# Patient Record
Sex: Female | Born: 1940 | Race: Black or African American | Hispanic: No | Marital: Married | Smoking: Former smoker
Health system: Southern US, Community
[De-identification: ages and names within clinical notes are randomized; demographics above are authoritative.]

## PROBLEM LIST (undated history)

## (undated) DIAGNOSIS — K5790 Diverticulosis of intestine, part unspecified, without perforation or abscess without bleeding: Secondary | ICD-10-CM

## (undated) DIAGNOSIS — Z87891 Personal history of nicotine dependence: Secondary | ICD-10-CM

## (undated) DIAGNOSIS — I10 Essential (primary) hypertension: Secondary | ICD-10-CM

## (undated) DIAGNOSIS — M1990 Unspecified osteoarthritis, unspecified site: Secondary | ICD-10-CM

## (undated) DIAGNOSIS — E785 Hyperlipidemia, unspecified: Secondary | ICD-10-CM

## (undated) DIAGNOSIS — K219 Gastro-esophageal reflux disease without esophagitis: Secondary | ICD-10-CM

## (undated) DIAGNOSIS — R011 Cardiac murmur, unspecified: Secondary | ICD-10-CM

## (undated) DIAGNOSIS — Z9289 Personal history of other medical treatment: Secondary | ICD-10-CM

## (undated) DIAGNOSIS — R931 Abnormal findings on diagnostic imaging of heart and coronary circulation: Secondary | ICD-10-CM

## (undated) DIAGNOSIS — E119 Type 2 diabetes mellitus without complications: Secondary | ICD-10-CM

## (undated) DIAGNOSIS — I2510 Atherosclerotic heart disease of native coronary artery without angina pectoris: Secondary | ICD-10-CM

## (undated) DIAGNOSIS — R0600 Dyspnea, unspecified: Secondary | ICD-10-CM

## (undated) DIAGNOSIS — H269 Unspecified cataract: Secondary | ICD-10-CM

## (undated) HISTORY — DX: Hyperlipidemia, unspecified: E78.5

## (undated) HISTORY — DX: Diverticulosis of intestine, part unspecified, without perforation or abscess without bleeding: K57.90

## (undated) HISTORY — DX: Unspecified osteoarthritis, unspecified site: M19.90

## (undated) HISTORY — PX: EYE SURGERY: SHX253

## (undated) HISTORY — DX: Essential (primary) hypertension: I10

## (undated) HISTORY — DX: Type 2 diabetes mellitus without complications: E11.9

## (undated) HISTORY — PX: COLONOSCOPY: SHX174

## (undated) HISTORY — DX: Personal history of other medical treatment: Z92.89

## (undated) HISTORY — DX: Personal history of nicotine dependence: Z87.891

## (undated) HISTORY — DX: Abnormal findings on diagnostic imaging of heart and coronary circulation: R93.1

## (undated) HISTORY — DX: Gastro-esophageal reflux disease without esophagitis: K21.9

## (undated) HISTORY — PX: TONSILLECTOMY: SUR1361

## (undated) HISTORY — DX: Unspecified cataract: H26.9

---

## 1963-02-28 HISTORY — PX: TONSILECTOMY, ADENOIDECTOMY, BILATERAL MYRINGOTOMY AND TUBES: SHX2538

## 1987-02-28 HISTORY — PX: ABDOMINAL HYSTERECTOMY: SHX81

## 1998-04-01 ENCOUNTER — Ambulatory Visit (HOSPITAL_COMMUNITY): Admission: RE | Admit: 1998-04-01 | Discharge: 1998-04-01 | Payer: Self-pay | Admitting: Family Medicine

## 1998-04-01 ENCOUNTER — Encounter: Payer: Self-pay | Admitting: Family Medicine

## 1999-03-29 ENCOUNTER — Encounter: Payer: Self-pay | Admitting: *Deleted

## 1999-03-29 ENCOUNTER — Emergency Department (HOSPITAL_COMMUNITY): Admission: EM | Admit: 1999-03-29 | Discharge: 1999-03-29 | Payer: Self-pay | Admitting: *Deleted

## 2002-03-05 ENCOUNTER — Other Ambulatory Visit: Admission: RE | Admit: 2002-03-05 | Discharge: 2002-03-05 | Payer: Self-pay | Admitting: *Deleted

## 2002-07-31 ENCOUNTER — Other Ambulatory Visit: Admission: RE | Admit: 2002-07-31 | Discharge: 2002-07-31 | Payer: Self-pay | Admitting: *Deleted

## 2003-02-28 DIAGNOSIS — R931 Abnormal findings on diagnostic imaging of heart and coronary circulation: Secondary | ICD-10-CM

## 2003-02-28 HISTORY — DX: Abnormal findings on diagnostic imaging of heart and coronary circulation: R93.1

## 2003-04-28 ENCOUNTER — Encounter (INDEPENDENT_AMBULATORY_CARE_PROVIDER_SITE_OTHER): Payer: Self-pay | Admitting: *Deleted

## 2003-04-28 LAB — CONVERTED CEMR LAB

## 2003-04-30 ENCOUNTER — Ambulatory Visit (HOSPITAL_COMMUNITY): Admission: RE | Admit: 2003-04-30 | Discharge: 2003-04-30 | Payer: Self-pay | Admitting: Family Medicine

## 2003-04-30 ENCOUNTER — Encounter: Admission: RE | Admit: 2003-04-30 | Discharge: 2003-04-30 | Payer: Self-pay | Admitting: Family Medicine

## 2003-12-11 ENCOUNTER — Ambulatory Visit: Payer: Self-pay

## 2004-02-28 DIAGNOSIS — Z9289 Personal history of other medical treatment: Secondary | ICD-10-CM

## 2004-02-28 HISTORY — DX: Personal history of other medical treatment: Z92.89

## 2004-06-23 ENCOUNTER — Ambulatory Visit: Payer: Self-pay | Admitting: Family Medicine

## 2004-10-06 ENCOUNTER — Ambulatory Visit: Payer: Self-pay | Admitting: Family Medicine

## 2004-10-14 ENCOUNTER — Ambulatory Visit: Payer: Self-pay | Admitting: Family Medicine

## 2004-10-20 ENCOUNTER — Encounter: Admission: RE | Admit: 2004-10-20 | Discharge: 2004-10-20 | Payer: Self-pay | Admitting: Sports Medicine

## 2004-12-16 ENCOUNTER — Ambulatory Visit: Payer: Self-pay | Admitting: Sports Medicine

## 2005-03-23 ENCOUNTER — Inpatient Hospital Stay (HOSPITAL_COMMUNITY): Admission: EM | Admit: 2005-03-23 | Discharge: 2005-03-24 | Payer: Self-pay | Admitting: Emergency Medicine

## 2005-03-23 ENCOUNTER — Ambulatory Visit: Payer: Self-pay | Admitting: Family Medicine

## 2005-03-27 ENCOUNTER — Ambulatory Visit: Payer: Self-pay | Admitting: Family Medicine

## 2005-03-27 ENCOUNTER — Encounter: Admission: RE | Admit: 2005-03-27 | Discharge: 2005-06-25 | Payer: Self-pay | Admitting: Family Medicine

## 2005-03-31 ENCOUNTER — Ambulatory Visit: Payer: Self-pay | Admitting: Family Medicine

## 2005-04-03 ENCOUNTER — Emergency Department (HOSPITAL_COMMUNITY): Admission: EM | Admit: 2005-04-03 | Discharge: 2005-04-03 | Payer: Self-pay | Admitting: Emergency Medicine

## 2005-04-04 ENCOUNTER — Emergency Department (HOSPITAL_COMMUNITY): Admission: EM | Admit: 2005-04-04 | Discharge: 2005-04-04 | Payer: Self-pay | Admitting: Family Medicine

## 2005-04-05 ENCOUNTER — Ambulatory Visit: Payer: Self-pay | Admitting: Family Medicine

## 2005-04-06 ENCOUNTER — Ambulatory Visit: Payer: Self-pay | Admitting: Family Medicine

## 2005-04-13 ENCOUNTER — Ambulatory Visit: Payer: Self-pay | Admitting: Family Medicine

## 2005-05-01 ENCOUNTER — Ambulatory Visit: Payer: Self-pay | Admitting: Family Medicine

## 2005-05-11 ENCOUNTER — Ambulatory Visit: Payer: Self-pay | Admitting: Family Medicine

## 2005-06-05 ENCOUNTER — Ambulatory Visit: Payer: Self-pay | Admitting: Family Medicine

## 2005-06-14 ENCOUNTER — Ambulatory Visit: Payer: Self-pay | Admitting: Family Medicine

## 2005-07-06 ENCOUNTER — Ambulatory Visit: Payer: Self-pay | Admitting: Family Medicine

## 2005-07-10 ENCOUNTER — Encounter: Admission: RE | Admit: 2005-07-10 | Discharge: 2005-07-10 | Payer: Self-pay | Admitting: Sports Medicine

## 2005-08-03 ENCOUNTER — Ambulatory Visit: Payer: Self-pay | Admitting: Family Medicine

## 2005-10-23 ENCOUNTER — Ambulatory Visit: Payer: Self-pay | Admitting: Family Medicine

## 2005-11-23 ENCOUNTER — Ambulatory Visit: Payer: Self-pay | Admitting: Sports Medicine

## 2005-12-13 ENCOUNTER — Ambulatory Visit: Payer: Self-pay | Admitting: Family Medicine

## 2005-12-28 ENCOUNTER — Ambulatory Visit: Payer: Self-pay | Admitting: Sports Medicine

## 2006-01-04 ENCOUNTER — Ambulatory Visit: Payer: Self-pay | Admitting: Sports Medicine

## 2006-02-02 ENCOUNTER — Ambulatory Visit: Payer: Self-pay | Admitting: Family Medicine

## 2006-04-26 DIAGNOSIS — E785 Hyperlipidemia, unspecified: Secondary | ICD-10-CM | POA: Insufficient documentation

## 2006-04-26 DIAGNOSIS — E119 Type 2 diabetes mellitus without complications: Secondary | ICD-10-CM

## 2006-04-26 DIAGNOSIS — L659 Nonscarring hair loss, unspecified: Secondary | ICD-10-CM | POA: Insufficient documentation

## 2006-04-26 DIAGNOSIS — I10 Essential (primary) hypertension: Secondary | ICD-10-CM | POA: Insufficient documentation

## 2006-04-26 DIAGNOSIS — M1990 Unspecified osteoarthritis, unspecified site: Secondary | ICD-10-CM | POA: Insufficient documentation

## 2006-04-26 DIAGNOSIS — E669 Obesity, unspecified: Secondary | ICD-10-CM | POA: Insufficient documentation

## 2006-04-26 DIAGNOSIS — M949 Disorder of cartilage, unspecified: Secondary | ICD-10-CM

## 2006-04-26 DIAGNOSIS — M899 Disorder of bone, unspecified: Secondary | ICD-10-CM | POA: Insufficient documentation

## 2006-04-26 DIAGNOSIS — E781 Pure hyperglyceridemia: Secondary | ICD-10-CM | POA: Insufficient documentation

## 2006-04-26 HISTORY — DX: Type 2 diabetes mellitus without complications: E11.9

## 2006-04-27 ENCOUNTER — Encounter (INDEPENDENT_AMBULATORY_CARE_PROVIDER_SITE_OTHER): Payer: Self-pay | Admitting: *Deleted

## 2006-05-31 ENCOUNTER — Encounter (INDEPENDENT_AMBULATORY_CARE_PROVIDER_SITE_OTHER): Payer: Self-pay | Admitting: Family Medicine

## 2006-05-31 ENCOUNTER — Ambulatory Visit: Payer: Self-pay | Admitting: Sports Medicine

## 2006-05-31 LAB — CONVERTED CEMR LAB
AST: 24 units/L (ref 0–37)
Albumin: 4.6 g/dL (ref 3.5–5.2)
BUN: 14 mg/dL (ref 6–23)
CO2: 26 meq/L (ref 19–32)
Chloride: 103 meq/L (ref 96–112)
Creatinine, Ser: 1.08 mg/dL (ref 0.40–1.20)
Direct LDL: 104 mg/dL — ABNORMAL HIGH
Hgb A1c MFr Bld: 6.6 %
Potassium: 3.6 meq/L (ref 3.5–5.3)
Total Bilirubin: 0.4 mg/dL (ref 0.3–1.2)

## 2006-06-01 ENCOUNTER — Telehealth (INDEPENDENT_AMBULATORY_CARE_PROVIDER_SITE_OTHER): Payer: Self-pay | Admitting: Family Medicine

## 2006-06-07 ENCOUNTER — Encounter (INDEPENDENT_AMBULATORY_CARE_PROVIDER_SITE_OTHER): Payer: Self-pay | Admitting: Family Medicine

## 2006-06-18 ENCOUNTER — Ambulatory Visit: Payer: Self-pay | Admitting: Family Medicine

## 2006-06-26 ENCOUNTER — Telehealth (INDEPENDENT_AMBULATORY_CARE_PROVIDER_SITE_OTHER): Payer: Self-pay | Admitting: Family Medicine

## 2006-10-11 ENCOUNTER — Ambulatory Visit: Payer: Self-pay | Admitting: Family Medicine

## 2006-10-11 ENCOUNTER — Encounter (INDEPENDENT_AMBULATORY_CARE_PROVIDER_SITE_OTHER): Payer: Self-pay | Admitting: Family Medicine

## 2006-10-11 LAB — CONVERTED CEMR LAB: Hgb A1c MFr Bld: 6.8 %

## 2006-10-16 ENCOUNTER — Encounter (INDEPENDENT_AMBULATORY_CARE_PROVIDER_SITE_OTHER): Payer: Self-pay | Admitting: Family Medicine

## 2006-10-23 ENCOUNTER — Telehealth (INDEPENDENT_AMBULATORY_CARE_PROVIDER_SITE_OTHER): Payer: Self-pay | Admitting: Family Medicine

## 2006-10-23 LAB — CONVERTED CEMR LAB: Direct LDL: 141 mg/dL — ABNORMAL HIGH

## 2006-10-30 ENCOUNTER — Encounter (INDEPENDENT_AMBULATORY_CARE_PROVIDER_SITE_OTHER): Payer: Self-pay | Admitting: Family Medicine

## 2006-11-07 ENCOUNTER — Ambulatory Visit (HOSPITAL_COMMUNITY): Admission: RE | Admit: 2006-11-07 | Discharge: 2006-11-07 | Payer: Self-pay | Admitting: Orthopedic Surgery

## 2007-01-15 ENCOUNTER — Ambulatory Visit: Payer: Self-pay | Admitting: Family Medicine

## 2007-03-26 ENCOUNTER — Encounter (INDEPENDENT_AMBULATORY_CARE_PROVIDER_SITE_OTHER): Payer: Self-pay | Admitting: Family Medicine

## 2007-03-26 ENCOUNTER — Ambulatory Visit: Payer: Self-pay | Admitting: Family Medicine

## 2007-03-29 ENCOUNTER — Encounter (INDEPENDENT_AMBULATORY_CARE_PROVIDER_SITE_OTHER): Payer: Self-pay | Admitting: Family Medicine

## 2007-03-29 LAB — CONVERTED CEMR LAB
Alkaline Phosphatase: 101 units/L (ref 39–117)
Creatinine, Ser: 0.82 mg/dL (ref 0.40–1.20)
Potassium: 3.3 meq/L — ABNORMAL LOW (ref 3.5–5.3)
Total Bilirubin: 0.4 mg/dL (ref 0.3–1.2)

## 2007-06-13 ENCOUNTER — Ambulatory Visit: Payer: Self-pay | Admitting: Family Medicine

## 2007-06-13 DIAGNOSIS — K219 Gastro-esophageal reflux disease without esophagitis: Secondary | ICD-10-CM | POA: Insufficient documentation

## 2007-06-13 LAB — CONVERTED CEMR LAB: Hgb A1c MFr Bld: 6.8 %

## 2007-06-25 ENCOUNTER — Encounter: Admission: RE | Admit: 2007-06-25 | Discharge: 2007-06-25 | Payer: Self-pay | Admitting: Family Medicine

## 2007-07-04 ENCOUNTER — Encounter: Admission: RE | Admit: 2007-07-04 | Discharge: 2007-07-04 | Payer: Self-pay | Admitting: Family Medicine

## 2007-09-10 ENCOUNTER — Encounter (INDEPENDENT_AMBULATORY_CARE_PROVIDER_SITE_OTHER): Payer: Self-pay | Admitting: Family Medicine

## 2007-09-10 ENCOUNTER — Ambulatory Visit: Payer: Self-pay | Admitting: Family Medicine

## 2007-09-10 LAB — CONVERTED CEMR LAB: Hgb A1c MFr Bld: 6.9 %

## 2007-09-16 ENCOUNTER — Encounter (INDEPENDENT_AMBULATORY_CARE_PROVIDER_SITE_OTHER): Payer: Self-pay | Admitting: Family Medicine

## 2007-09-16 LAB — CONVERTED CEMR LAB
Calcium: 9.8 mg/dL (ref 8.4–10.5)
Glucose, Bld: 104 mg/dL — ABNORMAL HIGH (ref 70–99)

## 2007-09-24 ENCOUNTER — Encounter: Payer: Self-pay | Admitting: *Deleted

## 2007-09-24 ENCOUNTER — Telehealth: Payer: Self-pay | Admitting: *Deleted

## 2007-12-02 ENCOUNTER — Encounter (INDEPENDENT_AMBULATORY_CARE_PROVIDER_SITE_OTHER): Payer: Self-pay | Admitting: Family Medicine

## 2007-12-11 ENCOUNTER — Ambulatory Visit: Payer: Self-pay | Admitting: Family Medicine

## 2007-12-11 ENCOUNTER — Encounter (INDEPENDENT_AMBULATORY_CARE_PROVIDER_SITE_OTHER): Payer: Self-pay | Admitting: Family Medicine

## 2007-12-11 LAB — CONVERTED CEMR LAB: Hgb A1c MFr Bld: 6.5 %

## 2008-04-08 ENCOUNTER — Ambulatory Visit: Payer: Self-pay | Admitting: Family Medicine

## 2008-04-08 ENCOUNTER — Encounter (INDEPENDENT_AMBULATORY_CARE_PROVIDER_SITE_OTHER): Payer: Self-pay | Admitting: Family Medicine

## 2008-04-08 LAB — CONVERTED CEMR LAB: Hgb A1c MFr Bld: 7.1 %

## 2008-06-19 ENCOUNTER — Telehealth: Payer: Self-pay | Admitting: *Deleted

## 2008-09-01 ENCOUNTER — Telehealth: Payer: Self-pay | Admitting: *Deleted

## 2008-09-02 ENCOUNTER — Encounter (INDEPENDENT_AMBULATORY_CARE_PROVIDER_SITE_OTHER): Payer: Self-pay | Admitting: Family Medicine

## 2008-09-02 ENCOUNTER — Ambulatory Visit: Payer: Self-pay | Admitting: Family Medicine

## 2008-09-02 LAB — CONVERTED CEMR LAB
AST: 20 units/L (ref 0–37)
Alkaline Phosphatase: 93 units/L (ref 39–117)
CO2: 24 meq/L (ref 19–32)
Calcium: 9.8 mg/dL (ref 8.4–10.5)
Chloride: 105 meq/L (ref 96–112)
Cholesterol: 170 mg/dL (ref 0–200)
Creatinine, Ser: 0.8 mg/dL (ref 0.40–1.20)
LDL Cholesterol: 90 mg/dL (ref 0–99)
Total Bilirubin: 0.4 mg/dL (ref 0.3–1.2)
Total CHOL/HDL Ratio: 4.3
Triglycerides: 199 mg/dL — ABNORMAL HIGH (ref <150)
VLDL: 40 mg/dL (ref 0–40)

## 2008-09-03 ENCOUNTER — Encounter (INDEPENDENT_AMBULATORY_CARE_PROVIDER_SITE_OTHER): Payer: Self-pay | Admitting: Family Medicine

## 2008-11-25 ENCOUNTER — Telehealth: Payer: Self-pay | Admitting: *Deleted

## 2008-12-07 ENCOUNTER — Ambulatory Visit: Payer: Self-pay | Admitting: Family Medicine

## 2008-12-07 LAB — CONVERTED CEMR LAB: Hgb A1c MFr Bld: 7.2 %

## 2008-12-14 ENCOUNTER — Ambulatory Visit: Payer: Self-pay | Admitting: Internal Medicine

## 2008-12-17 ENCOUNTER — Telehealth: Payer: Self-pay | Admitting: Internal Medicine

## 2008-12-21 ENCOUNTER — Ambulatory Visit: Payer: Self-pay | Admitting: Internal Medicine

## 2008-12-21 ENCOUNTER — Encounter: Payer: Self-pay | Admitting: Internal Medicine

## 2008-12-22 ENCOUNTER — Encounter: Payer: Self-pay | Admitting: Internal Medicine

## 2008-12-24 ENCOUNTER — Ambulatory Visit: Payer: Self-pay | Admitting: Family Medicine

## 2009-03-02 ENCOUNTER — Ambulatory Visit: Payer: Self-pay | Admitting: Family Medicine

## 2009-03-16 ENCOUNTER — Encounter: Admission: RE | Admit: 2009-03-16 | Discharge: 2009-03-16 | Payer: Self-pay | Admitting: Family Medicine

## 2009-03-25 ENCOUNTER — Encounter: Payer: Self-pay | Admitting: Family Medicine

## 2009-03-25 ENCOUNTER — Ambulatory Visit: Payer: Self-pay | Admitting: Family Medicine

## 2009-05-03 ENCOUNTER — Ambulatory Visit: Payer: Self-pay | Admitting: Family Medicine

## 2009-05-10 ENCOUNTER — Ambulatory Visit: Payer: Self-pay | Admitting: Family Medicine

## 2009-05-21 ENCOUNTER — Ambulatory Visit: Payer: Self-pay | Admitting: Family Medicine

## 2009-05-21 ENCOUNTER — Encounter: Payer: Self-pay | Admitting: Family Medicine

## 2009-05-21 LAB — CONVERTED CEMR LAB
BUN: 12 mg/dL (ref 6–23)
Chloride: 102 meq/L (ref 96–112)

## 2009-05-26 ENCOUNTER — Ambulatory Visit: Payer: Self-pay | Admitting: Family Medicine

## 2009-06-21 ENCOUNTER — Ambulatory Visit: Payer: Self-pay | Admitting: Family Medicine

## 2009-06-21 ENCOUNTER — Telehealth (INDEPENDENT_AMBULATORY_CARE_PROVIDER_SITE_OTHER): Payer: Self-pay | Admitting: *Deleted

## 2009-06-21 LAB — CONVERTED CEMR LAB
BUN: 19 mg/dL (ref 6–23)
CO2: 19 meq/L (ref 19–32)
Glucose, Urine, Semiquant: >=1000
Ketones, urine, test strip: NEGATIVE
Magnesium: 2.3 mg/dL (ref 1.5–2.5)
Nitrite: NEGATIVE
Potassium: 4.5 meq/L (ref 3.5–5.3)
Protein, U semiquant: NEGATIVE
Specific Gravity, Urine: <1.005
Urobilinogen, UA: 0.2

## 2009-06-22 ENCOUNTER — Telehealth: Payer: Self-pay | Admitting: Family Medicine

## 2009-06-28 ENCOUNTER — Ambulatory Visit: Payer: Self-pay | Admitting: Family Medicine

## 2009-06-28 LAB — CONVERTED CEMR LAB: Hgb A1c MFr Bld: 11.5 %

## 2009-07-13 ENCOUNTER — Ambulatory Visit: Payer: Self-pay | Admitting: Family Medicine

## 2009-07-13 ENCOUNTER — Encounter: Payer: Self-pay | Admitting: Family Medicine

## 2009-07-13 LAB — CONVERTED CEMR LAB
Albumin: 4.2 g/dL (ref 3.5–5.2)
BUN: 10 mg/dL (ref 6–23)
Bilirubin Urine: NEGATIVE
Chloride: 104 meq/L (ref 96–112)
Glucose, Bld: 291 mg/dL — ABNORMAL HIGH (ref 70–99)
Ketones, urine, test strip: NEGATIVE
MCHC: 32.6 g/dL (ref 30.0–36.0)
Nitrite: NEGATIVE
Platelets: 282 10*3/uL (ref 150–400)
Potassium: 3.9 meq/L (ref 3.5–5.3)
RDW: 14 % (ref 11.5–15.5)
Total Protein: 6.9 g/dL (ref 6.0–8.3)
Urobilinogen, UA: 0.2
WBC: 8.5 10*3/uL (ref 4.0–10.5)

## 2009-07-14 ENCOUNTER — Encounter: Payer: Self-pay | Admitting: Family Medicine

## 2009-08-26 ENCOUNTER — Ambulatory Visit: Payer: Self-pay | Admitting: Family Medicine

## 2009-12-28 ENCOUNTER — Telehealth: Payer: Self-pay | Admitting: *Deleted

## 2009-12-28 DIAGNOSIS — I7090 Unspecified atherosclerosis: Secondary | ICD-10-CM | POA: Insufficient documentation

## 2010-01-05 ENCOUNTER — Telehealth: Payer: Self-pay | Admitting: Family Medicine

## 2010-01-11 ENCOUNTER — Ambulatory Visit: Payer: Self-pay | Admitting: Vascular Surgery

## 2010-01-11 ENCOUNTER — Ambulatory Visit (HOSPITAL_COMMUNITY): Admission: RE | Admit: 2010-01-11 | Discharge: 2010-01-11 | Payer: Self-pay | Admitting: Family Medicine

## 2010-02-07 ENCOUNTER — Ambulatory Visit: Payer: Self-pay | Admitting: Family Medicine

## 2010-02-08 ENCOUNTER — Encounter: Payer: Self-pay | Admitting: Family Medicine

## 2010-02-08 ENCOUNTER — Ambulatory Visit: Payer: Self-pay | Admitting: Family Medicine

## 2010-02-08 LAB — CONVERTED CEMR LAB
Alkaline Phosphatase: 102 units/L (ref 39–117)
BUN: 8 mg/dL (ref 6–23)
Chloride: 105 meq/L (ref 96–112)
Cholesterol: 146 mg/dL (ref 0–200)
Glucose, Bld: 138 mg/dL — ABNORMAL HIGH (ref 70–99)
LDL Cholesterol: 80 mg/dL (ref 0–99)
Sodium: 143 meq/L (ref 135–145)
Total Bilirubin: 0.5 mg/dL (ref 0.3–1.2)
Total Protein: 7.1 g/dL (ref 6.0–8.3)
Triglycerides: 121 mg/dL (ref <150)
VLDL: 24 mg/dL (ref 0–40)

## 2010-03-23 ENCOUNTER — Encounter (INDEPENDENT_AMBULATORY_CARE_PROVIDER_SITE_OTHER): Payer: Self-pay | Admitting: *Deleted

## 2010-03-31 NOTE — Progress Notes (Signed)
Summary: Rx Prob  Phone Note Call from Patient Call back at Home Phone [phone]   Caller: Patient Summary of Call: pt having trouble with new medication she is taking. Initial call taken by: Raymond Gurney,  June 21, 2009 10:47 AM  Follow-up for Phone Call        patient reports since starting HCTZ she has been voiding frequently  every 15 to 30 minutes.  now for past 4 days has had leg cramping. Dr. Tye Savoy notified and he advises to stop HCTZ now and needs office visit.  appointment scheduled for [place] this afternoon with Dr. McDiarmid. patient states she has not taken HCTZ today yet. Follow-up by: Marcell Barlow RN,  June 21, 2009 12:38 PM

## 2010-03-31 NOTE — Assessment & Plan Note (Signed)
Summary: FU/KH   Vital Signs:  Patient profile:   70 year old female Height:      53.75 inches Weight:      178.1 pounds BMI:     43.50 Temp:     97.9 degrees F oral Pulse rate:   71 / minute BP sitting:   122 / 73  (left arm) Cuff size:   regular  Vitals Entered By: Levert Feinstein LPN (Jul 14, 5091 2:67 PM) CC: f/u dm Is Patient Diabetic? Yes Did you bring your meter with you today? No Pain Assessment Patient in pain? no        Primary Care Provider:  Augusto Gamble DO  CC:  f/u dm.  History of Present Illness: 1. DM: Ran out of testing strips, has not been checking sugars.  At last visit Dr. McDiarmid increased Glipizide to 76m by mouth QAM and QPM.  Also on metformin 10014mtwo times a day.  Often forgets to take PM dose of both meds.  Interested in starting insulin, but only wants to do one shot a day and admits she may not always remember to do that.  Was on insulin about 5 yrs ago when first dx, but quickly transitioned to by mouth meds about 8 weeks after insulin.   Denies polyuria, no polydipsia, no leg cramping. Vision much improved and back to baseline.   Hypoglycemic symptoms:none  Vaginal itching and discharge much improved after 3 days tx, no vaginal pain 2. HTN: Stable.  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 Take 1 Tablet By Mouth Every Morning 2)  Metformin Hcl 1000 Mg Tabs (Metformin Hcl) ...Marland Kitchen 1 Tablet By Mouth Twice A Day 3)  Ibuprofen 800 Mg Tabs (Ibuprofen) .... Take 1 Tablet By Mouth Every Twelve Hours 4)  Multivitamins   Tabs (Multiple Vitamin) .... One Daily 5)  Pravachol 40 Mg  Tabs (Pravastatin Sodium) .... One Tablet Daily 6)  Ranitidine Hcl 150 Mg Tabs (Ranitidine Hcl) .... 1/2 Tablet Daily 7)  Calcium 600/vitamin D 600-400 Mg-Unit Tabs (Calcium Carbonate-Vitamin D) .... 2 Tablets Daily 8)  Fish Oil  Oil (Fish Oil) .... 3 Pills A Day 9)  Lopressor 100 Mg Tabs (Metoprolol Tartrate) .... Take 1 Pill By Mouth Bid 10)  Glipizide 5 Mg Tabs (Glipizide) .... Two Tablets  By Mouth 30 Minutes Before Breakfast  and Two Tablets Before Evening Meal 11)  Norvasc 10 Mg Tabs (Amlodipine Besylate) .... Take 1 Pill By Mouth Qd 12)  Accu-Chek Advantage Diabetes  Kit (Blood Glucose Monitoring Suppl) .... Please Provide Meter, Strips, Lancets For Two Times A Day Cbg's  Allergies (verified): 1)  ! * Lisinopril  Past History:  Past Medical History: Last updated: 01/15/2007 SCr=1.0 (02/2005) UTI (03/2005) Bone Density - Lt hip = -1.0, L spine = -1.8 - 10/26/2004 cystectomy L wrist -  02/27/1965 ECHO - 06/28/2003 stress cardiolity - no ischemia or infarction; EF 47% - 12/30/2003  Past Surgical History: Last updated: 01/15/2007  Hysterectomy & SO (one removed) - 02/28/1987  Tonsillectomy - 02/28/1963  Family History: Last updated: Jun 30, 2006 father - died CAD, CHF, HTN mother - died CAD, `brain tumor` older brother - died CAD, CABG age 20,  older sister - died mouth cancer younger sister - died age 52,  rheumatic fever  Social History: Last updated: 04/08/2008 Married.  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 Lives in [place] with second wife. Has 4 grandchildren.  Tobacco >62yr, quit 02/2005 after admission for DKA.  Drinks at least 2 beers per day.  Worked at [employer] [place]yrbut was laid off fall 2008. 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 Niece (pCommunity education officerwho has mental issues (i.e. stabbed her baby who survived) currently living with pt - somewhat stressful for her.   Risk Factors: Alcohol Use: 1 (06/28/2009)  Risk Factors: Smoking Status: never (07/13/2009)  Review of Systems       The patient complains of weight gain.  The patient denies weight loss, vision loss, chest pain, syncope, dyspnea on exertion, peripheral edema, headaches, hemoptysis, incontinence, and muscle weakness.    Physical Exam  General:  VS reviewed, alert, well-developed, well-nourished, and well-hydrated.   Eyes:  vision grossly intact.   Mouth:  pharynx pink and moist.   Lungs:  normal respiratory effort, normal breath sounds, no crackles, and no wheezes.   Heart:  normal rate, regular rhythm, no murmur, no gallop, and no rub.   Abdomen:  soft, non-tender, normal bowel sounds, and no distention.   Msk:  normal ROM.   Extremities:  No clubbing, cyanosis, edema, or deformity noted with normal full range of motion of all joints.   Neurologic:  alert & oriented X3, cranial nerves II-XII intact, and DTRs symmetrical and normal.   Skin:  turgor normal and color normal.    Diabetes Management Exam:    Foot Exam (with socks and/or shoes not present):       Sensory-Pinprick/Light touch:          Left medial foot (L-4): normal          Left dorsal foot (L-5): normal          Left lateral foot (S-1): normal          Right medial foot (L-4): normal          Right dorsal foot (L-5): normal   Impression & Recommendations:  Problem # 1:  DIABETES MELLITUS II, UNCOMPLICATED (ICOEU-235.36Assessment Improved Discussed POC with Dr. ChErin Hearingncouraged pt to take all meds as prescribed.  Will order CMP today for renal assesment.  UA for glucose and protein. CBG in office is imporved at 266.  Pt not a good insulin canidate at this time  as she is unable to remember to take her oral meds.  Would like to get max benefit from by mouth meds prior to starting insulin.  Pt requesting new meter and strips as she feels reassured by checking her CBG's. Signed paperwork from [place] weeks ago, but pt still has not recieved supplies.  Will send script to pharmacy now for meter and supplies, if too expensive pt will call insurance and call back with what meter she would like.   Already on 200049metformin and max dose glipizide, pt to try and remember to take all doses.  Will recheck A1C in 8 weeks and readjust meds as needed.   Reviewed s/s of hypoglycemia.  Her updated medication list for this problem includes:    Aspirin Ec 81 Mg Tbec (Aspirin) .Marland Kitchen... Take 1 tablet by mouth every morning    Metformin Hcl 1000 Mg Tabs (Metformin hcl) .Marland Kitchen... 1 tablet by mouth twice a day    Glipizide 5 Mg Tabs (Glipizide) .Marland Kitchen..Marland Kitchen Two tablets  by mouth 30 minutes before breakfast  and two tablets before evening meal  Orders: Glucose Cap-[facility] (37482) Urinalysis-[facility] (00000) CBC-[facility] (70786) Comp Met-[facility] (75449-20100) [facility]- Est  Level 4 (71219)  Her updated medication list for this problem includes:    Aspirin Ec 81 Mg Tbec (Aspirin) .Marland Kitchen... Take 1 tablet by mouth every morning    Metformin Hcl 1000 Mg Tabs (Metformin hcl) .Marland Kitchen... 1 tablet by mouth twice a day    Glipizide 5 Mg Tabs (Glipizide) .Marland Kitchen..Marland Kitchen Two tablets  by mouth 30 minutes before breakfast  and two tablets before evening meal  Problem # 2:  RENAL INSUFFICIENCY, ACUTE (ICD-585.9) CMP today.  Consider adjusting meds if impaired Orders: Comp Met-[facility] (75883-25498) [place]- Est  Level 4 (26415)  Problem # 3:  HYPERTENSION, BENIGN SYSTEMIC (ICD-401.1) Assessment: Improved At goal. Continue Norvasc and Lopressor.  daily ASA Her updated medication list for this problem includes:    Lopressor 100 Mg Tabs (Metoprolol tartrate) .Marland Kitchen... Take 1 pill by mouth bid    Norvasc 10 Mg Tabs (Amlodipine besylate) .Marland Kitchen... Take  1 pill by mouth qd  Orders: Urinalysis-[facility] (00000) CBC-[facility] (83094) Comp Met-[facility] (07680-88110) [place]- Est  Level 4 (31594)  Complete Medication List: 1)  Aspirin Ec 81 Mg Tbec (Aspirin) .... Take 1 tablet by mouth every morning 2)  Metformin Hcl 1000 Mg Tabs (Metformin hcl) .Marland Kitchen.. 1 tablet by mouth twice a day 3)  Ibuprofen 800 Mg Tabs (Ibuprofen) .... Take 1 tablet by mouth every twelve hours 4)  Multivitamins Tabs (Multiple vitamin) .... One daily 5)  Pravachol 40 Mg Tabs (Pravastatin sodium) .... One tablet daily 6)  Ranitidine Hcl 150 Mg Tabs (Ranitidine hcl) .... 1/2 tablet daily 7)  Calcium 600/vitamin D 600-400 Mg-unit Tabs (Calcium carbonate-vitamin d) .... 2 tablets daily 8)  Fish Oil Oil (Fish oil) .... 3 pills a day 9)  Lopressor 100 Mg Tabs (Metoprolol tartrate) .... Take 1 pill by mouth bid 10)  Glipizide 5 Mg Tabs (Glipizide) .... Two tablets  by mouth 30 minutes before breakfast  and two tablets before evening meal 11)  Norvasc 10 Mg Tabs (Amlodipine besylate) .... Take 1 pill by mouth qd 12)  Accu-chek Advantage Diabetes Kit (Blood glucose monitoring suppl) .... Please provide meter, strips, lancets for two times a day cbg's  Patient Instructions: 1)  Contionue taking metformin and glipizide twice a day.  2)  Continue with metoprolol, norvasc 3)  Check your CBG's two times a day, write them down 4)  I will call you with your lab results if they are abnormal. 5)  Follow up in 4-8 weeks Prescriptions: GLIPIZIDE 5 MG TABS (GLIPIZIDE) Two tablets  by mouth 30 minutes before breakfast  and two tablets before evening meal  #120 x 1   Entered and Authorized by:   Augusto Gamble DO   Signed by:   Augusto Gamble DO on 07/13/2009   Method used:   Electronically to        [facility] [phone]* (retail)       [address]  [place], [place]  [address]       Ph: [phone]       Fax: [phone]   RxID:   [id number] LOPRESSOR 100 MG TABS (METOPROLOL TARTRATE)  Take 1 pill by mouth BID  #60 x 6   Entered and Authorized by:   Augusto Gamble DO   Signed by:   Augusto Gamble DO on 07/13/2009   Method used:   Electronically to        [facility] [phone]* (retail)       [address]       [place], [place]  [address]       Ph: [phone]       Fax: [phone]   RxID:   [id number] RANITIDINE HCL 150 MG TABS (RANITIDINE HCL) 1/2 tablet daily  #60 x 6   Entered and Authorized by:   Augusto Gamble DO   Signed by:   Augusto Gamble DO on 07/13/2009   Method used:   Electronically to        [facility] [phone]* (retail)       [address]       [place], [place]  [address]       Ph: [phone]       Fax: [phone]   RxID:   [id number] IBUPROFEN 800 MG TABS (IBUPROFEN) Take 1 tablet by mouth every twelve hours  #30 x 6   Entered and Authorized by:   Augusto Gamble DO   Signed by:   Augusto Gamble DO on 07/13/2009   Method used:   Electronically to        [facility] [phone]* (retail)       [address]       [place], [place]  [address]       Ph: [phone]       Fax: [phone]   RxID:   [id number] PRAVACHOL 40 MG  TABS (PRAVASTATIN SODIUM) one tablet daily  #30 x 6   Entered and Authorized by:   Augusto Gamble DO   Signed by:   Augusto Gamble DO on 07/13/2009   Method used:   Electronically to        [facility] [phone]* (retail)       [place], [place]  [address]       Ph: [phone]       Fax: [phone]   RxID:   [id number] METFORMIN HCL 1000 MG TABS (METFORMIN HCL) 1 tablet by mouth twice a day  #60 x 6   Entered and Authorized by:   Augusto Gamble DO   Signed by:   Augusto Gamble DO on 07/13/2009   Method used:   Electronically to        [facility] [phone]* (retail)       [address]       [place], [place]  [address]       Ph: [phone]       Fax: [phone]   RxID:   [id number] [place]  KIT (BLOOD GLUCOSE MONITORING  SUPPL) Please provide meter, strips, lancets for two times a day CBG's  #1 x 0   Entered and Authorized by:   Augusto Gamble DO   Signed by:   Augusto Gamble DO on 07/13/2009   Method used:   Electronically to        [facility] [phone]* (retail)  [place], [place]  [address]       Ph: [phone]       Fax: [phone]   RxID:   [id number] NORVASC 10 MG TABS (AMLODIPINE BESYLATE) take 1 pill by mouth QD  #30 x 6   Entered and Authorized by:   Augusto Gamble DO   Signed by:   Augusto Gamble DO on 07/13/2009   Method used:   Electronically to        [facility] [phone]* (retail)       [address]       [place], [place]  [address]       Ph: [phone]       Fax: [phone]   RxID:   [id number]    Prevention & Chronic Care Immunizations   Influenza vaccine: Fluvax 3+  (12/11/2007)   Influenza vaccine due: 12/10/2008    Tetanus booster: 05/28/2004: Done.   Tetanus booster due: 05/29/2014    Pneumococcal vaccine: Pneumovax  (12/11/2007)   Pneumococcal vaccine due: None    H. zoster vaccine: 04/08/2008: refused  Colorectal Screening   Hemoccult: Done.  (11/27/2004)   Hemoccult due: 11/27/2005    Colonoscopy: DONE  (12/21/2008)   Colonoscopy action/deferral: GI referral  (03/02/2009)   Colonoscopy due: 12/2018  Other Screening   Pap smear: Done.  (04/28/2003)   Pap smear due: Not Indicated    Mammogram: ASSESSMENT: Negative - BI-RADS 1^MM DIGITAL SCREENING  (03/16/2009)   Mammogram action/deferral: Ordered  (03/02/2009)   Mammogram due: 07/04/2008    DXA bone density scan: Done.  (09/27/2004)   DXA scan due: None    Smoking status: never  (07/13/2009)  Diabetes Mellitus   HgbA1C: 11.5  (06/28/2009)   Hemoglobin A1C due: 09/28/2009    Eye exam: unsure  (11/30/2008)   Eye exam due: 11/30/2009    Foot exam: yes  (07/13/2009)   High risk foot: Not documented   Foot care education: completed  (04/08/2008)    Foot exam due: 10/13/2009    Urine microalbumin/creatinine ratio: Not documented   Urine microalbumin/cr due: Not Indicated    Diabetes flowsheet reviewed?: Yes   Progress toward A1C goal: Deteriorated  Lipids   Total Cholesterol: 170  (09/02/2008)   LDL: 90  (09/02/2008)   LDL Direct: 114  (09/10/2007)   HDL: 40  (09/02/2008)   Triglycerides: 199  (09/02/2008)   Lipid panel due: 09/02/2009    SGOT (AST): 20  (09/02/2008)   SGPT (ALT): 24  (09/02/2008) CMP ordered    Alkaline phosphatase: 93  (09/02/2008)   Total bilirubin: 0.4  (09/02/2008)   Liver panel due: 08/30/2009    Lipid flowsheet reviewed?: Yes   Progress toward LDL goal: At goal  Hypertension   Last Blood Pressure: 122 / 73  (07/13/2009)   Serum creatinine: 1.32  (06/21/2009)   BMP action: Ordered   Serum potassium 4.5  (06/21/2009) CMP ordered    Basic metabolic panel due: 16/02/930    Hypertension flowsheet reviewed?: Yes   Progress toward BP goal: At goal  Self-Management Support :   Personal Goals (by the next clinic visit) :     Personal A1C goal: 7  (07/13/2009)     Personal blood pressure goal: 140/90  (05/26/2009)     Personal LDL goal: 100  (07/13/2009)    Patient will work on the following items until the next clinic visit to reach self-care goals:     Medications and monitoring:  take my medicines every day, check my blood sugar, check my blood pressure, bring all of my medications to every visit, weigh myself weekly, examine my feet every day  (07/13/2009)     Eating: drink diet soda or water instead of juice or soda, eat more vegetables, eat foods that are low in salt, eat baked foods instead of fried foods  (07/13/2009)     Activity: take a 30 minute walk every day, join a walking program  (07/13/2009)    Diabetes self-management support: Copy of home glucose meter record, CBG self-monitoring log, Written self-care plan  (07/13/2009)   Diabetes care plan printed   Last diabetes  self-management training by diabetes educator: completed    Hypertension self-management support: Written self-care plan  (07/13/2009)   Hypertension self-care plan printed.    Lipid self-management support: Not documented   Laboratory Results   Urine Tests  Date/Time Received: Jul 13, 2009 2:23 PM  Date/Time Reported: Jul 13, 2009 3:15 PM   Routine Urinalysis   Color: yellow Appearance: Clear Glucose: >=1000   (Normal Range: Negative) Bilirubin: negative   (Normal Range: Negative) Ketone: negative   (Normal Range: Negative) Spec. Gravity: 1.010   (Normal Range: 1.003-1.035) Blood: negative   (Normal Range: Negative) pH: 5.5   (Normal Range: 5.0-8.0) Protein: negative   (Normal Range: Negative) Urobilinogen: 0.2   (Normal Range: 0-1) Nitrite: negative   (Normal Range: Negative) Leukocyte Esterace: negative   (Normal Range: Negative)    Comments: ...............test performed by......Marland KitchenBonnie A. Martinique, MLS (ASCP)cm
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
## 2010-03-31 NOTE — Assessment & Plan Note (Signed)
Summary: BMP drawn and BP ck  Lab Visit drew BMP Shauntia Levengood [place], MLS (ASCP)cm  Orders Today: No Charge Patient Arrived (NCPA0) [NCPA0]  BP checked today using large adult cuff after patient rested. BP LA 130/74, RA 124/68 pulse 78. patient brought all meds with her today and meds were reviewed. she is taking everything as directed.  Lisinopril 40 mg is not on med list but it is in the last office visit note.    patient then reports for past month has had a dry cough, hoarseness for past week and 1/2. states her acid refluxl has been bothering her more lately. the rantdidine helps the early part of day but in the afternoon she has heartburn.  this AM she had an irritated area on left side of tonge but now does not notice so much. she has been using a lot of cough drops due to dry cough. consulted with Dr. Lindell Noe and he advises for patient to stop Lisinopril and Zetoretic. will send in Rx for HCTZ 25 mg daily. follow up with doctor middle of next week. Dr. Scheryl Darter does not have any available  appointments . appointment scheduled with Dr. Tye Savoy on [facility] team. Marcell Barlow RN  May 21, 2009 11:22 AM

## 2010-03-31 NOTE — Assessment & Plan Note (Signed)
Summary: bp check/eo  Nurse Visit patient in office to recheck BP. BP checked manually using large cuff. BP LA 150/84 RA 150/86 pulse 68. patient is taking all meds as directed. . she did not bring bottles in with her but went home and called back and verified meds and she is taking correctly .  taking Zestoretic 20/12.5 mg two tabs daily, Lisinopril 40 mg daily and metoprolol 100 mg twice daily. she did take meds today as usual. will send  message to MD to please advise. Marcell Barlow RN  May 10, 2009 3:47 PM   Allergies: No Known Drug Allergies  Added Norvasc 44m by mouth once daily to BP regimen, [facility] patient of [facility] and have her follow up for nurse visit in 1 week for repeat BP check as well as BMP to check her kidneys due to the increased lisinopril dose in January. Thanks!  Orders Added: 1)  No Charge Patient Arrived (NCPA0) [NCPA0] 2)  Basic Met-[facility] [phone]: NORVASC 10 MG TABS (AMLODIPINE BESYLATE) take 1 pill by mouth daily  #30 x 0   Entered and Authorized by:   BAugusto GambleDO   Signed by:   BAugusto GambleDO on 05/11/2009   Method used:   Electronically to        [facility]#[phone] (retail)       [address] [place]  [address]      Ph: [phone]      Fax: [phone]  RxID::   [id number]  Appended Document: bp check/eo  message left on voicemail for patient to call back. PMarcell BarlowRN  May 12, 2009 10:21 AM  message again left to call back. left above message from MD on this message. PMarcell BarlowRN  May 13, 2009 11:20 AM   spoke with patient and appointment scheduled to return for labs and  BP check 05/21/2009 PMarcell BarlowRN  May 13, 2009 3:50 PM

## 2010-03-31 NOTE — Progress Notes (Signed)
  Phone Note Call from Patient   Caller: Patient Call For: [phone] Summary of Call: Ms. Spizzirri want her rx for Glipizide to be changed back to the original dosage.  She doesn't feel that the what she's taking now is helping.  She need it to be increased. Initial call taken by: Eusebio Friendly,  January 05, 2010 3:05 PM    Can you please ask her what that medicine and dosing was? Augusto Gamble

## 2010-03-31 NOTE — Assessment & Plan Note (Signed)
Summary: HCTZ started 05/26/2009, now voiding freq and leg cramping/ls   Vital Signs:  Patient profile:   70 year old female Height:      53.75 inches Weight:      176 pounds BMI:     42.99 Temp:     98.2 degrees F oral BP sitting:   130 / 64  (left arm) Cuff size:   large  Vitals Entered By: Schuyler Amor CMA (June 21, 2009 1:34 PM) CC: polyuria, polydipsia and left leg cramp since starting HCTZ 2 weeks ago Is Patient Diabetic? Yes Pain Assessment Patient in pain? no        Primary Care Provider:  Augusto Gamble DO  CC:  polyuria and polydipsia and left leg cramp since starting HCTZ 2 weeks ago.  History of Present Illness: Onset of frequent urinary large amount of urine about 4 days after start of HCTZ. Associated blurry vision, and dry mouth and constant thirst. Left calf cramps at night as well. She feels like she did when she was first diagnosed with diabetes.   No dysuria. Hx of stress incontinence that has worsened since onset of this new problem. No urgency.  PMH significant for DMT2 for which she takes her metformin as prescribed.   Habits & Providers  Alcohol-Tobacco-Diet     Alcohol drinks/day: 0     Tobacco Status: quit  Allergies: 1)  ! * Lisinopril PMH reviewed for relevance  Social History: Smoking Status:  quit  Physical Exam  General:  alert.  obese. NAD Lungs:  Lungs are clear to auscultation, no crackles or wheezes. Heart:  normal rate, regular rhythm, and no murmur.   Msk:  No CVAT Extremities:  No peripheral edema Feet without lesions Neurologic:  alert & oriented X3 and gait normal.   Psych:  memory intact for recent and remote, normally interactive, good eye contact, not anxious appearing, and not depressed appearing.     Impression & Recommendations:  Problem # 1:  DIABETES MELLITUS II, UNCOMPLICATED (CWC-376.28)  Loss of glycemic control.  Patient acribes the loss of control to start of HCTZ 25 mg daily.   I am uncertain  if this is the origin of this change, but agreed with her remaining off the HCTZ until her degree of glycemic control is significantly improved. Her GFR has declined, likely a prerenal state from osmotic glycosuria caused by her hyperglycemic state. This should improve with improvement in her glycemic control and adequate oral hydration. Will start Glipizide XL 5 mg by mouth each morning before breakfast.  She is to get a glucometer and record her fasting CBGs each morning then bring the record to her next OV next week. Will check A1c on that visit, along with recheck BMET.   She is to continue the metformin as prescribed.  Instructed to push by mouth fluids (non-sugar types) of 8 to 10 glasses a day to re-hydrate.  Can consider a rechallenge of HCTZ in future, perhaps at lower dose 6.25 to 12.5 mg daily.  Her updated medication list for this problem includes:    Aspirin Ec 81 Mg Tbec (Aspirin) .Marland Kitchen... Take 1 tablet by mouth every morning    Metformin Hcl 1000 Mg Tabs (Metformin hcl) .Marland Kitchen... 1 tablet by mouth twice a day    (NEW) Glipizide 5 Mg Tabs (Glipizide) ..... One tablet by mouth 30 minutes before breakfast each morning  Orders: [place]- Est Level  3 ([address])  Complete Medication List: 1)  Aspirin Ec 81 Mg Tbec (Aspirin) .Marland KitchenMarland KitchenMarland Kitchen  Take 1 tablet by mouth every morning 2)  Metformin Hcl 1000 Mg Tabs (Metformin hcl) .Marland Kitchen.. 1 tablet by mouth twice a day 3)  Ibuprofen 800 Mg Tabs (Ibuprofen) .... Take 1 tablet by mouth every twelve hours 4)  Multivitamins Tabs (Multiple vitamin) .... One daily 5)  Pravachol 40 Mg Tabs (Pravastatin sodium) .... One tablet daily 6)  Ranitidine Hcl 150 Mg Tabs (Ranitidine hcl) .... 1/2 tablet daily 7)  Calcium 600/vitamin D 600-400 Mg-unit Tabs (Calcium carbonate-vitamin d) .... 2 tablets daily 8)  Fish Oil Oil (Fish oil) .... 3 pills a day 9)  Lopressor 100 Mg Tabs (Metoprolol tartrate) .... Take 1 pill by mouth bid 10)  Hydrochlorothiazide 25 Mg Tabs  (Hydrochlorothiazide) .Marland Kitchen.. 1 by mouth once daily 11)  Glipizide 5 Mg Tabs (Glipizide) .... One tablet by mouth 30 minutes before breakfast each morning  Other Orders: Magnesium-[facility] ([id number]) Urinalysis-[facility] (00000) Basic Met-[facility] (08144-81856) Glucose Cap-[facility] (31497)  Patient Instructions: 1)  Please schedule a follow-up appointment in 1 week with Dr McDiarmid.  May double book. 2)  Take one Glipizide XL 10 mg tablet 30 minutes before breakfast each morning 3)  Check your blood sugar each morning and write it down.  Bring your written down blood sugars to your next doctor's appointment.  Prescriptions: GLIPIZIDE 5 MG TABS (GLIPIZIDE) One tablet by mouth 30 minutes before breakfast each morning  #30 x 3   Entered and Authorized by:   Sherren Mocha McDiarmid MD   Signed by:   Sherren Mocha McDiarmid MD on 06/21/2009   Method used:   Electronically to        [facility] [phone]* (retail)       [address]       [place], [place]  [address]       Ph: [phone]       Fax: [phone]   RxID:   [id number]   Laboratory Results   Urine Tests  Date/Time Received: June 21, 2009 1:54 PM  Date/Time Reported: 2:35 PM   Routine Urinalysis   Color: yellow Appearance: Clear Glucose: >=1000   (Normal Range: Negative) Bilirubin: negative   (Normal Range: Negative) Ketone: negative   (Normal Range: Negative) Spec. Gravity: <1.005   (Normal Range: 1.003-1.035) Blood: small   (Normal Range: Negative) pH: 5.0   (Normal Range: 5.0-8.0) Protein: negative   (Normal Range: Negative) Urobilinogen: 0.2   (Normal Range: 0-1) Nitrite: negative   (Normal Range: Negative) Leukocyte Esterace: negative   (Normal Range: Negative)  Urine Microscopic WBC/HPF: rare RBC/HPF: 0-3 Bacteria/HPF: 2+ cocci Mucous/HPF: none Epithelial/HPF: rare Crystals/HPF: none Casts/LPF: none Yeast/HPF: none    Comments: Ria Bush  MD  June 21, 2009 2:36 PM

## 2010-03-31 NOTE — Assessment & Plan Note (Signed)
Summary: cpe,tcb   Vital Signs:  Patient profile:   70 year old female Height:      53.75 inches Weight:      188 pounds BMI:     45.92 BSA:     1.68 Temp:     98.3 degrees F Pulse rate:   62 / minute BP sitting:   178 / 91  Vitals Entered By: Christen Bame CMA (March 02, 2009 1:35 PM)  Serial Vital Signs/Assessments:  Time      Position  BP       Pulse  Resp  Temp     By                     178/88                         Augusto Gamble DO  CC: f/u Is Patient Diabetic? Yes Did you bring your meter with you today? No Pain Assessment Patient in pain? no        Primary Care Provider:  Augusto Gamble DO  CC:  f/u.  History of Present Illness: 70 yo female here for f/u of multiple issues. 1. DM: hasn't been checking her sugars.  Last A1c was 7.2%.  Will repeat this in 3 months.  Pt c/o blurry vision, last eye exam was in 09/10, pt states her exam was normal and that there was no diabetes in her eyes.  Does have hx of cataracts.  No headaches.  No increase in urine, burning or pain with urination. 2.  HTN:  BP high today.  Pts states she is taking all her meds daily, not missing any doses.  No headaches, no palpitations, no c/p, no SOB, no edema.    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 Take 1 Tablet By  Mouth Every Morning 2)  Zestoretic 20-12.5 Mg  Tabs (Lisinopril-Hydrochlorothiazide) .... Two Tablets Daily 3)  Metformin Hcl 1000 Mg Tabs (Metformin Hcl) .Marland Kitchen.. 1 Tablet By Mouth Twice A Day 4)  Ibuprofen 800 Mg Tabs (Ibuprofen) .... Take 1 Tablet By Mouth Every Twelve Hours 5)  Multivitamins   Tabs (Multiple Vitamin) .... One Daily 6)  Pravachol 40 Mg  Tabs (Pravastatin Sodium) .... One Tablet Daily 7)  Ranitidine Hcl 150 Mg Tabs (Ranitidine Hcl) .... 1/2 Tablet Daily 8)  Calcium 600/vitamin D 600-400 Mg-Unit Tabs (Calcium Carbonate-Vitamin D) .... 2 Tablets Daily 9)  Fish Oil  Oil (Fish Oil) .... 3 Pills A Day 10)  Lopressor 100 Mg Tabs (Metoprolol Tartrate) .... Take 1 Pill By Mouth Bid 11)  Lisinopril 40 Mg Tabs (Lisinopril) .... Take 1 Pill Daily  Allergies (verified): No Known Drug Allergies  Past History:  Past Medical History: Last updated: 01/15/2007 SCr=1.0 (02/2005) UTI (03/2005) Bone Density - Lt hip = -1.0, L spine = -1.8 -  10/26/2004 cystectomy L wrist - 02/27/1965 ECHO - 06/28/2003 stress cardiolity - no ischemia or infarction; EF 47% - 12/30/2003  Past Surgical History: Last updated: 01/15/2007  Hysterectomy & SO (one removed) - 02/28/1987  Tonsillectomy - 02/28/1963  Family History: Last updated: Jun 16, 2006 father - died CAD, CHF, HTN mother - died CAD, `brain tumor` older brother - died CAD, CABG age 60,  older sister - died mouth cancer younger sister - died age 58,  rheumatic fever  Social History: Last updated: 04/08/2008 Married.  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 Lives in [place] second wife. Has 4 grandchildren.  Tobacco >100yr, quit 02/2005 after admission for DKA.  Drinks at least 2 beers per day.  Worked at [employer] a s[employer] >353yrbut was laid off fall 2008. 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 Niece (pCommunity education officerwho has mental issues (i.e. stabbed her baby who survived) currently living with pt - somewhat stressful for her.    Risk Factors: Smoking Status: never (03/02/2009)  Social History: Reviewed history from 04/08/2008 and no changes required. Married.  One son retired arCorporate treasurerLives [place]. Has 4 grandchildren.  Tobacco >1569yrquit 02/2005 after admission for DKA.  Drinks at least 2 beers per day.  Worked at [employer] a sp[employer] >30y52yrt was laid off fall 2008. 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 Niece (pauCommunity education officero has mental issues (i.e. stabbed her baby who survived) currently living with pt - somewhat stressful for her. Smoking Status:  never  Physical Exam  General:  VS reviewed.   Well-developed,well-nourished,in no acute distress; alert,appropriate and cooperative throughout examination Lungs:  Normal respiratory effort, chest expands symmetrically. Lungs are clear to auscultation, no crackles or wheezes. Heart:  Normal rate and regular rhythm. S1 and S2 normal without gallop, murmur, click, rub or other extra sounds. Msk:  normal ROM, no joint tenderness, no joint swelling, and no joint warmth.   Extremities:  No clubbing, cyanosis, edema, or deformity noted with normal full range of motion of all joints.   Neurologic:  alert & oriented X3, cranial nerves II-XII intact, strength normal in all extremities, sensation intact to light touch, sensation intact to pinprick, gait normal, and DTRs symmetrical and normal.   Skin:  Intact without suspicious lesions or rashes Psych:  Cognition and judgment appear intact. Alert and cooperative with normal attention span and concentration. No apparent delusions, illusions, hallucinations  Diabetes Management Exam:    Foot Exam (with socks and/or shoes not present):       Sensory-Pinprick/Light touch:          Left medial foot (L-4): normal          Left dorsal foot (L-5): normal          Left lateral foot (S-1): normal          Right medial foot (L-4): normal          Right dorsal foot (L-5): normal           Right lateral foot (S-1): normal       Sensory-Monofilament:          Left foot: normal          Right foot: normal       Inspection:          Left foot: normal          Right foot: normal  Nails:          Left foot: normal          Right foot: normal    Eye Exam:       Eye Exam done elsewhere          Date: 11/11/2008          Results: normal          Done by: unsure   Impression & Recommendations:  Problem # 1:  HYPERTENSION, BENIGN SYSTEMIC (ICD-401.1) Assessment Deteriorated BP today high.  Repeat high as well.  Pt states she is taking all meds as directed.  Will increase lisinopril to max dose.  recheck in 2 weeks.  No c/p, dizziness, headaches, changes in vision.   Her updated medication list for this problem includes:    Zestoretic 20-12.5 Mg Tabs (Lisinopril-hydrochlorothiazide) .Marland Kitchen..Marland Kitchen Two tablets daily    Lopressor 100 Mg Tabs (Metoprolol tartrate) .Marland Kitchen... Take 1 pill by mouth bid    Lisinopril 40 Mg Tabs (Lisinopril) .Marland Kitchen... Take 1 pill daily  Orders: [place]- Est Level  3 (69794)  Problem # 2:  DIABETES MELLITUS II, UNCOMPLICATED (IAX-655.37) Assessment: Unchanged Recent A1c  ~7%.  Pt not checking CBG's at all.  Has not been exercising like before.  Encouraged her to return to [facility] and to f/u with dr. Jenne Campus for nutrition.  Encouraged to check some fasting CBG's and to bring to f/u visit and 2 weeks.  Will change meds at that time if need be.  Pt does not report any s/s of low sugars.  Her updated medication list for this problem includes:    Aspirin Ec 81 Mg Tbec (Aspirin) .Marland Kitchen... Take 1 tablet by mouth every morning    Zestoretic 20-12.5 Mg Tabs (Lisinopril-hydrochlorothiazide) .Marland Kitchen..Marland Kitchen Two tablets daily    Metformin Hcl 1000 Mg Tabs (Metformin hcl) .Marland Kitchen... 1 tablet by mouth twice a day    Lisinopril 40 Mg Tabs (Lisinopril) .Marland Kitchen... Take 1 pill daily  Orders: [place]- Est Level  3 ([id number])  Complete Medication List: 1)  Aspirin Ec 81 Mg Tbec (Aspirin) .... Take 1 tablet by  mouth every morning 2)  Zestoretic 20-12.5 Mg Tabs (Lisinopril-hydrochlorothiazide) .... Two tablets daily 3)  Metformin Hcl 1000 Mg Tabs (Metformin hcl) .Marland Kitchen.. 1 tablet by mouth twice a day 4)  Ibuprofen 800 Mg Tabs (Ibuprofen) .... Take 1 tablet by mouth every twelve hours 5)  Multivitamins Tabs (Multiple vitamin) .... One daily 6)  Pravachol 40 Mg Tabs (Pravastatin sodium) .... One tablet daily 7)  Ranitidine Hcl 150 Mg Tabs (Ranitidine hcl) .... 1/2 tablet daily 8)  Calcium 600/vitamin D 600-400 Mg-unit Tabs (Calcium carbonate-vitamin d) .... 2 tablets daily 9)  Fish Oil Oil (Fish oil) .... 3 pills a day 10)  Lopressor 100 Mg Tabs (Metoprolol tartrate) .... Take 1 pill by mouth bid 11)  Lisinopril 40 Mg Tabs (Lisinopril) .... Take 1 pill daily  Other Orders: Mammogram (Screening) (Mammo)  Patient Instructions: 1)  Nice to see you today! 2)  I am adding another blood pressure medicine, Lisinopril, please take it twice a day. 3)  Call me if you have any problems with your new medicine. 4)  Follow up with me in 3 weeks and we will check labs, please check fasting sugars and bring them in with you. Prescriptions: LISINOPRIL 40 MG TABS (LISINOPRIL) Take 1 pill daily  #30 x 5   Entered and Authorized by:   Augusto Gamble DO   Signed by:   Augusto Gamble  DO on 03/02/2009   Method used:   Electronically to        [facility] [phone]* (retail)       [address]       [place], [place]  [address]       Ph: [phone]       Fax: [phone]   RxID:   [id number]    Prevention & Chronic Care Immunizations   Influenza vaccine: Fluvax 3+  (12/11/2007)   Influenza vaccine due: 12/10/2008    Tetanus booster: 05/28/2004: Done.   Tetanus booster due: 05/29/2014    Pneumococcal vaccine: Pneumovax  (12/11/2007)   Pneumococcal vaccine due: None    H. zoster vaccine: 04/08/2008: refused  Colorectal Screening   Hemoccult: Done.  (11/27/2004)   Hemoccult due: 11/27/2005     Colonoscopy: DONE  (12/21/2008)   Colonoscopy action/deferral: GI referral  (03/02/2009)   Colonoscopy due: 12/2018  Other Screening   Pap smear: Done.  (04/28/2003)   Pap smear due: Not Indicated    Mammogram: normal  (07/05/2007)   Mammogram action/deferral: Ordered  (03/02/2009)   Mammogram due: 07/04/2008    DXA bone density scan: Done.  (09/27/2004)   DXA scan due: None    Smoking status: never  (03/02/2009)  Diabetes Mellitus   HgbA1C: 7.2  (12/07/2008)   Hemoglobin A1C due: 08/30/2009    Eye exam: unsure  (11/30/2008)   Eye exam due: 11/30/2009    Foot exam: yes  (03/02/2009)   High risk foot: Not documented   Foot care education: completed  (04/08/2008)   Foot exam due: 09/09/2008    Urine microalbumin/creatinine ratio: Not documented   Urine microalbumin/cr due: Not Indicated    Diabetes flowsheet reviewed?: Yes   Progress toward A1C goal: Unchanged  Lipids   Total Cholesterol: 170  (09/02/2008)   LDL: 90  (09/02/2008)   LDL Direct: 114  (09/10/2007)   HDL: 40  (09/02/2008)   Triglycerides: 199  (09/02/2008)   Lipid panel due: 05/31/2009    SGOT (AST): 20  (09/02/2008)   SGPT (ALT): 24  (09/02/2008)   Alkaline phosphatase: 93  (09/02/2008)   Total bilirubin: 0.4  (09/02/2008)   Liver panel due: 08/30/2009    Lipid flowsheet reviewed?: Yes   Progress toward LDL goal: Improved  Hypertension   Last Blood Pressure: 178 / 91  (03/02/2009)   Serum creatinine: 0.80  (09/02/2008)   Serum potassium 4.3  (08/27/1599)   Basic metabolic panel due: 09/32/3557    Hypertension flowsheet reviewed?: Yes   Progress toward BP goal: Deteriorated   Hypertension comments: Will increase lisinopril to max dose  Self-Management Support :    Diabetes self-management support: Not documented   Last diabetes self-management training by diabetes educator: completed    Hypertension self-management support: Not documented    Lipid self-management support: Not  documented    Nursing Instructions: Schedule screening mammogram (see order)

## 2010-03-31 NOTE — Assessment & Plan Note (Signed)
Summary: f/u eo   Vital Signs:  Patient profile:   70 year old female Height:      53.75 inches Weight:      181.7 pounds BMI:     44.38 Temp:     98.2 degrees F oral Pulse rate:   80 / minute BP sitting:   138 / 72  (left arm) Cuff size:   regular  Vitals Entered By: Levert Feinstein LPN (August 26, 9209 9:41 PM) CC: f/u dm Is Patient Diabetic? Yes Did you bring your meter with you today? Yes Pain Assessment Patient in pain? no        Primary Care Provider:  Augusto Gamble DO  CC:  f/u dm.  History of Present Illness: 70 yo female here for f/u multiple medical issues:  1. DM: Since previous visit, pt was able to get meter and stips for her DM. She reports checking CBG's for about a week and a half, but has since discontinued this. Unsure of what those numbers were and did not bring meter with her today. Denies any s/s of hypoglycemic episodes.   No polyuria, polydipsia, no blurry vision or headaches.   continues to only take meds in the AM when she remembers. 2. Weight gain:  Pt  gained about five pounds in the past several weeks; states her appetitie has improved. Has seen Dr. Jenne Campus before in regards to healthy eating with her DM and HTN, but she is not interested in seeing her again.  She is trying to eat healthy, but is also resistent to give up or lessen intake of certain foods.  She thinks about exercising, and would like to increase her acitivity, but has not made changes to make this happen.  3. HTN: no leg swelling, vision changes, shortness of breath or chest pain.  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 Take 1 Tablet By Mouth Every Morning 2)  Metformin Hcl 1000 Mg Tabs (Metformin Hcl) .Marland Kitchen.. 1 Tablet By Mouth Twice A Day 3)  Ibuprofen 800 Mg Tabs (Ibuprofen) .... Take 1 Tablet By Mouth Every Twelve Hours 4)  Multivitamins   Tabs (Multiple Vitamin) .... One Daily 5)  Pravachol 40 Mg  Tabs (Pravastatin Sodium) .... One Tablet Daily 6)  Ranitidine Hcl 150 Mg Tabs (Ranitidine Hcl) .... 1/2 Tablet Daily 7)  Calcium 600/vitamin D 600-400 Mg-Unit Tabs (Calcium Carbonate-Vitamin D) .... 2 Tablets Daily 8)  Fish Oil  Oil (Fish Oil) .... 3 Pills A Day 9)  Lopressor 100 Mg Tabs (Metoprolol Tartrate) .... Take 1 Pill By Mouth Bid 10)  Glipizide 5 Mg Tabs (Glipizide) .... Two Tablets  By Mouth 30 Minutes Before Breakfast  and Two Tablets Before Evening Meal 11)  Amlodipine Besylate 10 Mg Tabs (Amlodipine Besylate) .... Take 1 Pill By Mouth Daily  Allergies (verified): 1)  ! * Lisinopril  Past History:  Past Medical History: Last updated: 01/15/2007 SCr=1.0 (  02/2005) UTI (03/2005) Bone Density - Lt hip = -1.0, L spine = -1.8 - 10/26/2004 cystectomy L wrist - 02/27/1965 ECHO - 06/28/2003 stress cardiolity - no ischemia or infarction; EF 47% - 12/30/2003  Past Surgical History: Last updated: 01/15/2007  Hysterectomy & SO (one removed) - 02/28/1987  Tonsillectomy - 02/28/1963  Family History: Last updated: 2006-06-02 father - died CAD, CHF, HTN mother - died CAD, `brain tumor` older brother - died CAD, CABG age 39,  older sister - died mouth cancer younger sister - died age 63,  rheumatic fever  Social History: Last updated: 04/08/2008 Married.  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 Lives in [place] with second wife. Has 4 grandchildren.  Tobacco  >41yr, quit 02/2005 after admission for DKA.  Drinks at least 2 beers per day.  Worked at [employer] a [employer] >312yrbut was laid off fall 2008. 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 Niece (pCommunity education officerwho has mental issues (i.e. stabbed her baby who survived) currently living with pt - somewhat stressful for her.   Risk Factors: Alcohol Use: 1 (06/28/2009)  Risk Factors: Smoking Status: never (08/26/2009)  Physical Exam  General:  VS reviewed, alert, well-developed, well-nourished, and well-hydrated.   Head:  Normocephalic and atraumatic without obvious abnormalities. Neck:  No deformities, masses, or tenderness noted. Lungs:  normal respiratory effort, normal breath sounds, no crackles, and no wheezes.   Heart:  normal rate, regular rhythm, no murmur, no gallop, and no rub.   Abdomen:  soft, non-tender, and normal bowel sounds.  soft, non-tender, and normal bowel sounds.   Pulses:  R dorsalis pedis normal and L dorsalis pedis normal.  R dorsalis pedis normal and L dorsalis pedis normal.   Extremities:  No clubbing, cyanosis, edema, or deformity noted with normal full range of motion of all joints.   Cervical Nodes:  No lymphadenopathy noted   Impression & Recommendations:  Problem # 1:  DIABETES MELLITUS II, UNCOMPLICATED (ICURK-270.62Capilary glucose today 124.  Continue with  metformin and glipizide as prescribed.   No need to check CBG's at home as pt is not an insulin canidate at this time.  Also glucose much improved today, I think we are heading in the right direction.  No hypo or hyperglycemic episodes.   Her updated medication list for this problem includes:    Aspirin Ec 81 Mg Tbec (Aspirin) ...Marland Kitchen. Take 1 tablet by mouth every morning    Metformin Hcl 1000 Mg Tabs (Metformin hcl) ...Marland Kitchen. 1 tablet by mouth twice a day    Glipizide 5 Mg Tabs (Glipizide) ...Marland Kitchen. Marland Kitchenwo tablets  by mouth 30 minutes before breakfast  and two tablets before evening  meal  Orders: Glucose Cap-[facility] ([id number]  Level 4 ([phone] Problem # 2:  HYPERTENSION, BENIGN SYSTEMIC (ICD-401.1)  Stable.  Will continue current medications with no changes.  Her updated medication list for this problem includes:    Lopressor 100 Mg Tabs (Metoprolol tartrate) ...Marland Kitchen. Take 1 pill by mouth bid    Amlodipine Besylate 10 Mg Tabs (Amlodipine besylate) ...Marland Kitchen. Take 1 pill by mouth daily  Orders: FMWarwickEst  Level 4 ([phone] Problem # 3:  OBESITY, NOS (ICD-278.00)  encourgaed to try and exercise 4-5 times per week.  Swimming is a very good option.   Not very intreested in making dietary changes at this time   Orders: [facility]  Level 4 ([phone] Complete Medication List: 1)  Aspirin Ec  81 Mg Tbec (Aspirin) .... Take 1 tablet by mouth every morning 2)  Metformin Hcl 1000 Mg Tabs (Metformin hcl) .Marland Kitchen.. 1 tablet by mouth twice a day 3)  Ibuprofen 800 Mg Tabs (Ibuprofen) .... Take 1 tablet by mouth every twelve hours 4)  Multivitamins Tabs (Multiple vitamin) .... One daily 5)  Pravachol 40 Mg Tabs (Pravastatin sodium) .... One tablet daily 6)  Ranitidine Hcl 150 Mg Tabs (Ranitidine hcl) .... 1/2 tablet daily 7)  Calcium 600/vitamin D 600-400 Mg-unit Tabs (Calcium carbonate-vitamin d) .... 2 tablets daily 8)  Fish Oil Oil (Fish oil) .... 3 pills a day 9)  Lopressor 100 Mg Tabs (Metoprolol tartrate) .... Take 1 pill by mouth bid 10)  Glipizide 5 Mg Tabs (Glipizide) .... Two tablets  by mouth 30 minutes before breakfast  and two tablets before evening meal 11)  Amlodipine Besylate 10 Mg Tabs (Amlodipine besylate) .... Take 1 pill by mouth daily  Patient Instructions: 1)  It was a pleasure to see you today.   2)  Please try to remember to take your medicines like we discussed today.  If you start feeling bad like before or urinating a lot please come to see [place] or come to the office.   3)  Try and exercise 3-5 times per week for 30 minutes at a time. 4)  Please schedule a  follow-up appointment in 3 months .  Prescriptions: AMLODIPINE BESYLATE 10 MG TABS (AMLODIPINE BESYLATE) take 1 pill by mouth daily  #30 x 11   Entered and Authorized by:   Augusto Gamble DO   Signed by:   Augusto Gamble DO on 08/29/2009   Method used:   Historical   RxID:   [id number]

## 2010-03-31 NOTE — Assessment & Plan Note (Signed)
Summary: BP CHECK/KH  Nurse Visit Patient in office this Am at 9:45 AM . She did not take her meds this morning before coming to office. usually takes at 8:30 AM. States she got up late  and did not eat so she did not take meds. B P checked manually using large adult cuff. RA 150/84, LA 150/86 pulse 68.  Weight today 188.  Advised patient to return in one week to check again and to make sure she takes her meds before coming in. She  voices understanding. Will forward message to MD.  Marcell Barlow RN  May 03, 2009 10:18 AM  Agree Augusto Gamble, DO   Allergies: No Known Drug Allergies  Orders Added: 1)  No Charge Patient Arrived (NCPA0) [NCPA0]

## 2010-03-31 NOTE — Assessment & Plan Note (Signed)
Summary: F/U PER Adalid Beckmann/EO   Vital Signs:  Patient profile:   70 year old female Height:      53.75 inches Weight:      175 pounds BMI:     42.74 BSA:     1.63 Temp:     98.3 degrees F Pulse rate:   71 / minute BP sitting:   131 / 79  Vitals Entered By: Christen Bame CMA (Jun 28, 2009 10:56 AM) CC: f/u DMT2 Is Patient Diabetic? Yes Did you bring your meter with you today? No Pain Assessment Patient in pain? no        Primary Care Provider:  Augusto Gamble DO  CC:  f/u DMT2.  History of Present Illness: DIABETES Disease Monitoring   Blood Sugar ranges: Average CBG 311 mg/dL (range 331 to 589), based on 9 CBGs of mixed pre- and post-prandial measurements.    Polyuria: much improved   Visual problems:continued visual blurring in both eyes, improved from last visit.    Dry throat is improved.  Pushing drinking water.     Medications   Compliance: taking on glipizide 5 mg each morning without rash, headache, GI upset. Side effects   Hypoglycemic symptoms:none  Itching complaining of vaginal itching and white vaginal discharge personal hx of yeast vaginitis when diabetes is uncontrolled. No odor. No vaginal pain.       Habits & Providers  Alcohol-Tobacco-Diet     Alcohol drinks/day: 1     Tobacco Status: never  Current Medications (verified): 1)  Aspirin Ec 81 Mg Tbec (Aspirin) .... Take 1 Tablet By Mouth Every Morning 2)  Metformin Hcl 1000 Mg Tabs (Metformin Hcl) .Marland Kitchen.. 1 Tablet By Mouth Twice A Day 3)  Ibuprofen 800 Mg Tabs (Ibuprofen) .... Take 1 Tablet By Mouth Every Twelve Hours 4)  Multivitamins   Tabs (Multiple Vitamin) .... One Daily 5)  Pravachol 40 Mg  Tabs (Pravastatin Sodium) .... One Tablet Daily 6)  Ranitidine Hcl 150 Mg Tabs (Ranitidine Hcl) .... 1/2 Tablet Daily 7)  Calcium 600/vitamin D 600-400 Mg-Unit Tabs (Calcium Carbonate-Vitamin D) .... 2 Tablets Daily 8)  Fish Oil  Oil (Fish Oil) .... 3 Pills A Day 9)  Lopressor 100 Mg Tabs  (Metoprolol Tartrate) .... Take 1 Pill By Mouth Bid 10)  Glipizide 5 Mg Tabs (Glipizide) .... Two Tablets  By Mouth 30 Minutes Before Breakfast  and Two Tablets Before Evening Meal 11)  Terconazole 0.4 % Crea (Terconazole) .... One Applicatorful Intravaginally Daily For 3 Days.  Disp: Quantity Sufficient. Refill: 1  Allergies: 1)  ! * Lisinopril  Past History:  Past medical history reviewed for relevance to current acute and chronic problems.  Past Medical History: Reviewed history from 01/15/2007 and no changes required. SCr=1.0 (02/2005) UTI (03/2005) Bone Density - Lt hip = -1.0, L spine = -1.8 - 10/26/2004 cystectomy L wrist - 02/27/1965 ECHO - 06/28/2003 stress cardiolity - no ischemia or infarction; EF 47% - 12/30/2003  Social History: Smoking Status:  never  Physical Exam  General:  alert, well-developed, well-nourished, and well-hydrated.     Impression & Recommendations:  Problem # 1:  DIABETES MELLITUS II, UNCOMPLICATED (IEP-329.51) Poor glycemic control but improved from last week.  Will increase immediate release glipizide 81m, two tablets before breakfast and two tablets before evening meal.  Continue checking fasting CBGs, record, and bring to appointment with Dr BScheryl Darterin two weeks.  Anticipate that blurry vision should continue improve with further improvement in glycemic contol. Her updated medication  list for this problem includes:    Aspirin Ec 81 Mg Tbec (Aspirin) .Marland Kitchen... Take 1 tablet by mouth every morning    Metformin Hcl 1000 Mg Tabs (Metformin hcl) .Marland Kitchen... 1 tablet by mouth twice a day    Glipizide 5 Mg Tabs (Glipizide) .Marland Kitchen..Marland Kitchen Two tablets  by mouth 30 minutes before breakfast  and two tablets before evening meal  Orders: A1C-[facility] (49449) [place]- Est Level  3 (67591)  Problem # 2:  RENAL INSUFFICIENCY, ACUTE (ICD-585.9) Recheck BMET today. Anticipate improvement in GFR with improvement in glycemic control.  Problem # 3:  Sx of VAGINAL PRURITUS  (ICD-698.1) Assessment: New  Will treat empirically as if yeast vaginitis.  Rx terazole 6.3% cream, one applicatorful intravaginallly daily for three days.   Orders: [place]- Est Level  3 (84665)  Complete Medication List: 1)  Aspirin Ec 81 Mg Tbec (Aspirin) .... Take 1 tablet by mouth every morning 2)  Metformin Hcl 1000 Mg Tabs (Metformin hcl) .Marland Kitchen.. 1 tablet by mouth twice a day 3)  Ibuprofen 800 Mg Tabs (Ibuprofen) .... Take 1 tablet by mouth every twelve hours 4)  Multivitamins Tabs (Multiple vitamin) .... One daily 5)  Pravachol 40 Mg Tabs (Pravastatin sodium) .... One tablet daily 6)  Ranitidine Hcl 150 Mg Tabs (Ranitidine hcl) .... 1/2 tablet daily 7)  Calcium 600/vitamin D 600-400 Mg-unit Tabs (Calcium carbonate-vitamin d) .... 2 tablets daily 8)  Fish Oil Oil (Fish oil) .... 3 pills a day 9)  Lopressor 100 Mg Tabs (Metoprolol tartrate) .... Take 1 pill by mouth bid 10)  Glipizide 5 Mg Tabs (Glipizide) .... Two tablets  by mouth 30 minutes before breakfast  and two tablets before evening meal 11)  Terconazole 0.4 % Crea (Terconazole) .... One applicatorful intravaginally daily for 3 days.  disp: quantity sufficient. refill: 1  Patient Instructions: 1)  Your blood sugar are improving but not quite were they need to be yet. 2)  Increase your Glipizide to 2 tablets before breakfast then two tablets before your evening meal. 3)  Keep writing down your blood sugars and bring them in whith you when you see Dr Scheryl Darter.  4)  Use the Terconazole cream to treat a possible yeast infection. If you do not have improvement in your itching after 3 days then let Dr Roslyn Else know.  Prescriptions: TERCONAZOLE 0.4 % CREA (TERCONAZOLE) One applicatorful intravaginally daily for 3 days.  Disp: quantity sufficient. Refill: 1  #1 x 1   Entered and Authorized by:   Sherren Mocha Dorn Hartshorne MD   Signed by:   Sherren Mocha Arlo Butt MD on 06/28/2009   Method used:   Electronically to        [id number]*  (retail)       [place], [place]  [address]       Ph: [phone]       Fax: [phone]   RxID:   [id number] GLIPIZIDE 5 MG TABS (GLIPIZIDE) Two tablets  by mouth 30 minutes before breakfast  and two tablets before evening meal  #120 x 1   Entered and Authorized by:   Sherren Mocha Shateka Petrea MD   Signed by:   Sherren Mocha Meher Kucinski MD on 06/28/2009   Method used:   Electronically to        [facility] [phone]* (retail)       [address]       [place], [place]  [address]       Ph:  [phone]       Fax: [phone]   RxID:   [id number]   Laboratory Results   Blood Tests   Date/Time Received: Jun 28, 2009 10:48 AM  Date/Time Reported: Jun 28, 2009 11:02 AM   HGBA1C: 11.5%   (Normal Range: Non-Diabetic - 3-6%   Control Diabetic - 6-8%)  Comments: ...........test performed by...........Marland KitchenHedy Camara, CMA

## 2010-03-31 NOTE — Letter (Signed)
Summary: Generic Letter  [facility]  [address]   [place], [place] [address]   Phone: [phone]  Fax: [phone]    03/23/2010  [address] [place], [place]  [address]  Dear Ms. Mehlhaff,  We are happy to let you know that since you are covered under Medicare you are able to have a FREE visit at the [facility] to discuss your HEALTH. This is a new benefit for Medicare.  There will be no co-payment.  At this visit you will meet with Lamont Dowdy an expert in wellness and the health coach at our clinic.  At this visit we will discuss ways to keep you healthy and feeling well.  This visit will not replace your regular doctor visit and we cannot refill medications.     You will need to plan to be here at least one hour to talk about your medical history, your current status, review all of your medications, and discuss your future plans for your health.  This information will be entered into your record for your doctor to have and review.  If you are interested in staying healthy, this type of visit can help.  Please call the office at: [phone], to schedule a "Medicare Wellness Visit".  The day of the visit you should bring in all of your medications, including any vitamins, herbs, over the counter products you take.  Make a list of all the other doctors that you see, so we know who they are. If you have any other health documents please bring them.  We look forward to helping you stay healthy.  Sincerely,   Suzanne [place]

## 2010-03-31 NOTE — Letter (Signed)
Summary: Generic Letter  [facility]  [address]   [place], [place] [address]   Phone: [phone]  Fax: [phone]    03/25/2009  RIYANA BIEL [address] [place], [place]  [address]  Dear Ms. Leyland,    Your HgbA1C was 6.8%!  That's great!  Last time it was 7.2%, so it's getting better and better, keep up the great work!     Sincerely,   Augusto Gamble DO  Appended Document: Generic Letter mailed.

## 2010-03-31 NOTE — Assessment & Plan Note (Signed)
Summary: f/u visit & labs/bmc   Vital Signs:  Patient profile:   70 year old female Height:      53.75 inches Weight:      188.5 pounds BMI:     46.04 Temp:     97.6 degrees F oral Pulse rate:   64 / minute BP sitting:   150 / 75  (right arm) Cuff size:   large  Vitals Entered By: Levert Feinstein LPN (March 25, 1608 2:27 PM) CC: f/u bp, dm Is Patient Diabetic? Yes Did you bring your meter with you today? Yes Pain Assessment Patient in pain? no        Primary Care Provider:  Augusto Gamble DO  CC:  f/u bp and dm.  History of Present Illness: 70 yo female here for f/u of BP and DM 1. HTN: BP much better today, still not at goal, but improving.  At max doses of lisinopril.  Denies c/p, SOB, palpitations, dizziness.  States vision has improved since BP has gone down. 2. DM: brought in sugar log. CBG's in AM fasting range from 97-143, no lows. CBG's before lunch: 168. CBG's before dinner 76-119.  Taking meds as directed.  Working on cutting down portions and cutting back on salt and fatty foods.  Saw Dr. Jenne Campus in Oct and states she is still using what she was taught.  Has been exercising a few times a week, [facility] [employer] [facility].  Trying to lose weight.  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 Take 1 Tablet  By Mouth Every Morning 2)  Zestoretic 20-12.5 Mg  Tabs (Lisinopril-Hydrochlorothiazide) .... Two Tablets Daily 3)  Metformin Hcl 1000 Mg Tabs (Metformin Hcl) .Marland Kitchen.. 1 Tablet By Mouth Twice A Day 4)  Ibuprofen 800 Mg Tabs (Ibuprofen) .... Take 1 Tablet By Mouth Every Twelve Hours 5)  Multivitamins   Tabs (Multiple Vitamin) .... One Daily 6)  Pravachol 40 Mg  Tabs (Pravastatin Sodium) .... One Tablet Daily 7)  Ranitidine Hcl 150 Mg Tabs (Ranitidine Hcl) .... 1/2 Tablet Daily 8)  Calcium 600/vitamin D 600-400 Mg-Unit Tabs (Calcium Carbonate-Vitamin D) .... 2 Tablets Daily 9)  Fish Oil  Oil (Fish Oil) .... 3 Pills A Day 10)  Lopressor 100 Mg Tabs (Metoprolol Tartrate) .... Take 1 Pill By Mouth Bid 11)  Lisinopril 40 Mg Tabs (Lisinopril) .... Take 1 Pill Daily  Allergies (verified): No Known Drug Allergies  Past History:  Past Medical History: Last updated: 01/15/2007 SCr=1.0 (02/2005) UTI (03/2005) Bone Density - Lt hip = -1.0, L spine = -1.8 - 10/26/2004 cystectomy L wrist - 02/27/1965 ECHO - 06/28/2003 stress cardiolity - no ischemia or infarction; EF 47% - 12/30/2003  Past Surgical History: Last updated: 01/15/2007  Hysterectomy & SO (one removed) - 02/28/1987  Tonsillectomy - 02/28/1963  Family History: Last updated: 06/30/2006 father - died CAD, CHF, HTN  mother - died CAD, `brain tumor` older brother - died CAD, CABG age 65,  older sister - died mouth cancer younger sister - died age 30,  rheumatic fever  Social History: Last updated: 04/08/2008 Married.  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 Lives in [place] with second wife. Has 4 grandchildren.  Tobacco >32yr, quit 02/2005 after admission for DKA.  Drinks at least 2 beers per day.  Worked at [employer] a s[employer] >326yrbut was laid off fall 2008. 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 Niece (pCommunity education officerwho has mental issues (i.e. stabbed her baby who survived) currently living with pt - somewhat stressful for her.    Risk Factors: Smoking Status: never (03/25/2009)  Review of Systems  The patient denies chest pain, syncope, dyspnea on exertion, peripheral edema, severe indigestion/heartburn, difficulty walking, and unusual weight change.    Physical Exam  General:  VS reviewed.   Well-developed,well-nourished,in no acute distress; alert,appropriate and cooperative throughout examination Neck:  supple, full ROM, no JVD, normal carotid upstroke, and no carotid bruits.   Lungs:  Normal respiratory effort, chest expands symmetrically. Lungs are clear to auscultation, no crackles or wheezes. Heart:  Normal rate and regular rhythm. S1 and S2 normal without gallop, murmur, click, rub or other extra sounds. Extremities:  no clubbing, cyanosis or edema Neurologic:  alert & oriented X3 and gait normal.   Skin:  Intact without suspicious lesions or rashes   Impression & Recommendations:  Problem # 1:  HYPERTENSION, BENIGN SYSTEMIC (ICD-401.1) Assessment Improved  Still not at goal. Lisinopril increased to max dose at last visit.  Pt is exercising and trying to lose weight.  Will have her do a BP check in 4-6 weeks, if at that time BP still not at goal, will add another med.  No c/p, SOB, dizziness Her updated medication list for this problem includes:    Zestoretic 20-12.5 Mg Tabs (Lisinopril-hydrochlorothiazide) ...Marland Kitchen. Marland Kitchenwo tablets daily    Lopressor 100 Mg Tabs (Metoprolol tartrate) ...Marland Kitchen. Take 1 pill by mouth bid    Lisinopril 40 Mg Tabs (Lisinopril) ...Marland Kitchen. Take 1 pill daily  Orders: [place]all[place]Est Level  3 ([phone] Problem # 2:  DIABETES MELLITUS II, UNCOMPLICATED (ICQBV-694.50Assessment: Unchanged Will get A1C today.  CBG's seem well controlled, a few outliers, but generally within range.  No lows.  Taking meds as directed.  Will continue to monitor and adjust meds as necc. Her updated medication list for this problem includes:    Aspirin Ec 81 Mg Tbec (Aspirin) ...Marland Kitchen. Take 1 tablet by mouth every  morning    Zestoretic 20-12.5 Mg Tabs (Lisinopril-hydrochlorothiazide) ...Marland Kitchen. Marland Kitchenwo tablets daily    Metformin Hcl 1000 Mg Tabs (Metformin hcl) ...Marland Kitchen. 1 tablet by mouth twice a day    Lisinopril 40 Mg Tabs (Lisinopril) ...Marland Kitchen. Take 1 pill daily  Orders: A1C-[facility] ([id number] PointEst Level  3 ([phone] Complete Medication List: 1)  Aspirin Ec 81 Mg Tbec (Aspirin) .... Take 1 tablet by mouth every morning 2)  Zestoretic 20-12.5 Mg Tabs (Lisinopril-hydrochlorothiazide) .... Two tablets daily 3)  Metformin Hcl 1000 Mg Tabs (Metformin hcl) ...Marland Kitchen 1 tablet by mouth twice a day 4)  Ibuprofen 800 Mg Tabs (Ibuprofen) .... Take 1 tablet by mouth every twelve hours 5)  Multivitamins Tabs (Multiple vitamin) .... One daily 6)  Pravachol 40 Mg Tabs (Pravastatin sodium) .... One tablet daily 7)  Ranitidine Hcl 150 Mg Tabs (Ranitidine hcl) .... 1/2 tablet daily 8)  Calcium 600/vitamin D 600-400 Mg-unit Tabs (Calcium carbonate-vitamin d) .... 2 tablets daily 9)  Fish Oil Oil (Fish oil) .... 3 pills a day 10)  Lopressor 100 Mg Tabs (Metoprolol tartrate) .... Take 1 pill by mouth bid 11)  Lisinopril 40 Mg Tabs (Lisinopril) .... Take 1 pill daily  Patient Instructions: 1)  Great to see you! 2)  Your blood pressure is much better today. 3)  Come to the office in 4-6 weeks and have your blood pressure che cked, you do not need to see me or make an appointment. 4)  See me in 4-6 months or sooner if you need me. 5)  Keep up the good work!  Appended Document: A1c  6.8 %    Lab Visit  Laboratory Results   Blood Tests   Date/Time Received: March 25, 2009 2:22 PM  Date/Time Reported: March 25, 2009 2:56 PM   HGBA1C: 6.8%   (Normal Range: Non-Diabetic - 3-6%   Control Diabetic - 6-8%)  Comments: ...............test performed by......Marland KitchenBonnie A. Martinique, MLS (ASCP)cm    Orders Today:

## 2010-03-31 NOTE — Progress Notes (Signed)
Summary: Hyperglycemia, ARF  Phone Note Other Incoming Call back at [phone]   Caller: Lab Summary of Call: Received notification of critical high glucose on BMet:  657.  This is consistent with elevated CBG seen in clinic this afternoon:  590.  Glipizide 5 mg qam prior to breakfast was added to her Metformin today.  Creatinine markedly increased from 1 month ago:  0.78 (03/25) --> 1.32 (04/25), which is a GFR of 48.  Hyponatremia of 129 also noted on BMet, but this corrects to normal range Na = 138 with this severe hyperglycemia.  Severe hyperglycemia with ARF.  Will forward note to triage nurse to discuss with preceptor first thing in the morning. Initial call taken by: Graciella Belton MD,  June 22, 2009 5:03 AM  Follow-up for Phone Call        I discussed with Dr. Wendy Poet.  The hyperglycemia was noted in clinic.  The ARF is likely secondary to dehydration from glucosuria.  He has added a second oral hypoglycemic to her regimen and has follow up appointment scheduled with Ms. Kalil. Follow-up by: Graciella Belton MD,  June 22, 2009 8:53 AM

## 2010-03-31 NOTE — Progress Notes (Signed)
Summary: Dr. Payton Emerald office called  Phone Note From Other Clinic   Caller: Judeen Hammans at Dr. Payton Emerald office [phone] Summary of Call: Dr. 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  The left eye being worse than the right eye.  This was seen on her eye exam last year and this year. Initial call taken by: Hedy Camara,  December 28, 2009 12:42 PM  Follow-up for Phone Call        Appt scheduled at [facility] for 01/11/10 at 10am , left message on vm informing pt, order faxed to [id number]. 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

## 2010-03-31 NOTE — Assessment & Plan Note (Signed)
Summary: follow up cough, lisinopril and zestoretic stopped 05/21/2009...   Vital Signs:  Patient profile:   70 year old female Height:      53.75 inches Weight:      189.1 pounds BMI:     46.19 Temp:     98.4 degrees F oral Pulse rate:   76 / minute BP sitting:   134 / 69  (left arm) Cuff size:   large  Vitals Entered By: Isabelle Course (May 26, 2009 3:20 PM) CC: Cough and HTN Is Patient Diabetic? Yes Did you bring your meter with you today? No Pain Assessment Patient in pain? no      Comments C/O cough, stopped lisinopril and zestoretic on 05/21/2009, Acid reflux   Primary Care Provider:  Augusto Gamble DO  CC:  Cough and HTN.  History of Present Illness: 1. Cough:  Pt had persistent cough that had lasted for weeks.  It was a dry cough.  Last week she was seen in clinic and told to stop her Lisinopril and Zestoretic.  Since then her cough has improved and i is now gone.      ROS: denies fever, lower extremity swelling.  Endorses acid reflux  2. HTN: She has been taking her new medications as prescribed.  She is now on Norvasc, HCTZ, and metoprolol.  She doesn't check her BP at home regularly.      ROS: denies chest pain, shortness of breath  Habits & Providers  Alcohol-Tobacco-Diet     Tobacco Status: never  Current Medications (verified): 1)  Aspirin Ec 81 Mg Tbec (Aspirin) .... Take 1 Tablet By Mouth Every Morning 2)  Metformin Hcl 1000 Mg Tabs (Metformin Hcl) .Marland Kitchen.. 1 Tablet By Mouth Twice A Day 3)  Ibuprofen 800 Mg Tabs (Ibuprofen) .... Take 1 Tablet By Mouth Every Twelve Hours 4)  Multivitamins   Tabs (Multiple Vitamin) .... One Daily 5)  Pravachol 40 Mg  Tabs (Pravastatin Sodium) .... One Tablet Daily 6)  Ranitidine Hcl 150 Mg Tabs (Ranitidine Hcl) .... 1/2 Tablet Daily 7)  Calcium 600/vitamin D 600-400 Mg-Unit Tabs (Calcium Carbonate-Vitamin D) .... 2 Tablets Daily 8)  Fish Oil  Oil (Fish Oil) .... 3 Pills A Day 9)  Lopressor 100 Mg Tabs (Metoprolol Tartrate)  .... Take 1 Pill By Mouth Bid 10)  Norvasc 10 Mg Tabs (Amlodipine Besylate) .... Take 1 Pill By Mouth Daily 11)  Hydrochlorothiazide 25 Mg Tabs (Hydrochlorothiazide) .Marland Kitchen.. 1 By Mouth Once Daily  Allergies (verified): 1)  ! * Lisinopril  Social History: Reviewed history from 04/08/2008 and no changes required. Married.  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 Lives in [place] with second wife. Has 4 grandchildren.  Tobacco >36yr, quit 02/2005 after admission for DKA.  Drinks at least 2 beers per day.  Worked at [employer] a s[employer] >327yrbut was laid off fall 2008. 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 Niece (pCommunity education officerwho has mental issues (i.e. stabbed her baby who survived) currently living with pt - somewhat stressful for her.   Physical Exam  General:  VS reviewed.   Well-developed,well-nourished,in no acute distress; alert,appropriate and cooperative throughout examination Mouth:  pharynx pink and moist.   Neck:  supple, full ROM, no JVD, normal carotid upstroke, and no carotid bruits.   Lungs:  Normal respiratory effort, chest expands symmetrically. Lungs are clear to auscultation, no crackles or wheezes. Heart:  Normal rate and regular rhythm. S1 and S2 normal without gallop, murmur, click,  rub or other extra sounds. Abdomen:  soft, non-tender, normal bowel sounds, and no distention.   Extremities:  no clubbing, cyanosis or edema   Impression & Recommendations:  Problem # 1:  COUGH (ICD-786.2) Assessment Improved  Likely related to ACEI since cough improved.  Will update allergy list.  Orders: [place]- Est Level  3 (45625)  Problem # 2:  HYPERTENSION, BENIGN SYSTEMIC (ICD-401.1) Assessment: Improved  will continue with current medications. Her updated medication list for this problem includes:    Lopressor 100 Mg Tabs (Metoprolol tartrate) .Marland Kitchen... Take 1 pill by mouth bid    Norvasc 10 Mg Tabs (Amlodipine besylate) .Marland Kitchen... Take 1 pill by mouth daily     Hydrochlorothiazide 25 Mg Tabs (Hydrochlorothiazide) .Marland Kitchen... 1 by mouth once daily  Orders: [place]- Est Level  3 (63893)  Complete Medication List: 1)  Aspirin Ec 81 Mg Tbec (Aspirin) .... Take 1 tablet by mouth every morning 2)  Metformin Hcl 1000 Mg Tabs (Metformin hcl) .Marland Kitchen.. 1 tablet by mouth twice a day 3)  Ibuprofen 800 Mg Tabs (Ibuprofen) .... Take 1 tablet by mouth every twelve hours 4)  Multivitamins Tabs (Multiple vitamin) .... One daily 5)  Pravachol 40 Mg Tabs (Pravastatin sodium) .... One tablet daily 6)  Ranitidine Hcl 150 Mg Tabs (Ranitidine hcl) .... 1/2 tablet daily 7)  Calcium 600/vitamin D 600-400 Mg-unit Tabs (Calcium carbonate-vitamin d) .... 2 tablets daily 8)  Fish Oil Oil (Fish oil) .... 3 pills a day 9)  Lopressor 100 Mg Tabs (Metoprolol tartrate) .... Take 1 pill by mouth bid 10)  Norvasc 10 Mg Tabs (Amlodipine besylate) .... Take 1 pill by mouth daily 11)  Hydrochlorothiazide 25 Mg Tabs (Hydrochlorothiazide) .Marland Kitchen.. 1 by mouth once daily  Patient Instructions: 1)  I am glad that your cough is gone.  It was most likely caused from the medicines that you were taking 2)  Your blood pressure is much better today. 3)  I think that we have you on some good medicines now. 4)  We will not make any changes today 5)  Please schedule a follow up appointment in 6 weeks with Dr. Scheryl Darter or myself Prescriptions: HYDROCHLOROTHIAZIDE 25 MG TABS (HYDROCHLOROTHIAZIDE) 1 by mouth once daily  #30 x 3   Entered and Authorized by:   Mylinda Latina MD   Signed by:   Mylinda Latina MD on 05/26/2009   Method used:   Electronically to        [facility] [phone]* (retail)       [place], [place]  [address]       Ph: [phone]       Fax: [phone]   RxID:   [id number] [place] 10 MG TABS (AMLODIPINE BESYLATE) take 1 pill by mouth daily  #30 x 3   Entered and Authorized by:   Mylinda Latina MD   Signed by:   Mylinda Latina MD on 05/26/2009   Method  used:   Electronically to        [facility] [phone]* (retail)       [address]       [place], [place]  [address]       Ph: [phone]       Fax: [phone]   RxID:   [id number]   Prevention & Chronic Care Immunizations   Influenza vaccine: Fluvax 3+  (12/11/2007)   Influenza vaccine due: 12/10/2008    Tetanus booster: 05/28/2004: Done.   Tetanus  booster due: 05/29/2014    Pneumococcal vaccine: Pneumovax  (12/11/2007)   Pneumococcal vaccine due: None    H. zoster vaccine: 04/08/2008: refused  Colorectal Screening   Hemoccult: Done.  (11/27/2004)   Hemoccult due: 11/27/2005    Colonoscopy: DONE  (12/21/2008)   Colonoscopy action/deferral: GI referral  (03/02/2009)   Colonoscopy due: 12/2018  Other Screening   Pap smear: Done.  (04/28/2003)   Pap smear due: Not Indicated    Mammogram: ASSESSMENT: Negative - BI-RADS 1^MM DIGITAL SCREENING  (03/16/2009)   Mammogram action/deferral: Ordered  (03/02/2009)   Mammogram due: 07/04/2008    DXA bone density scan: Done.  (09/27/2004)   DXA scan due: None    Smoking status: never  (05/26/2009)  Diabetes Mellitus   HgbA1C: 6.8  (03/25/2009)   Hemoglobin A1C due: 08/30/2009    Eye exam: unsure  (11/30/2008)   Eye exam due: 11/30/2009    Foot exam: yes  (03/02/2009)   High risk foot: Not documented   Foot care education: completed  (04/08/2008)   Foot exam due: 09/09/2008    Urine microalbumin/creatinine ratio: Not documented   Urine microalbumin/cr due: Not Indicated  Lipids   Total Cholesterol: 170  (09/02/2008)   LDL: 90  (09/02/2008)   LDL Direct: 114  (09/10/2007)   HDL: 40  (09/02/2008)   Triglycerides: 199  (09/02/2008)   Lipid panel due: 05/31/2009    SGOT (AST): 20  (09/02/2008)   SGPT (ALT): 24  (09/02/2008)   Alkaline phosphatase: 93  (09/02/2008)   Total bilirubin: 0.4  (09/02/2008)   Liver panel due: 08/30/2009  Hypertension   Last Blood Pressure: 134 / 69  (05/26/2009)   Serum  creatinine: 0.78  (05/21/2009)   Serum potassium 3.6  (43/27/6147)   Basic metabolic panel due: 11/25/5745    Hypertension flowsheet reviewed?: Yes   Progress toward BP goal: Improved  Self-Management Support :   Personal Goals (by the next clinic visit) :      Personal blood pressure goal: 140/90  (05/26/2009)   Diabetes self-management support: Not documented   Last diabetes self-management training by diabetes educator: completed    Hypertension self-management support: Not documented    Lipid self-management support: Not documented

## 2010-03-31 NOTE — Letter (Signed)
Summary: Generic Letter  [facility]  [address]   [place], [place] [address]   Phone: [phone]  Fax: [phone]    07/14/2009  MURRY KHIEV [address] [place], [place]  [address]  Dear Ms. Kleiman,   I have reviewed your labs from 07-13-09.  You blood sugar was elevated, but other than that all of your labs looked great!  Please continue taking your medicine as discussed in the office and follow up with me in 6-8 weeks.        Sincerely,   Augusto Gamble DO  Appended Document: Generic Letter mailed.

## 2010-04-06 ENCOUNTER — Ambulatory Visit (INDEPENDENT_AMBULATORY_CARE_PROVIDER_SITE_OTHER): Payer: No Typology Code available for payment source | Admitting: Family Medicine

## 2010-04-06 ENCOUNTER — Encounter: Payer: Self-pay | Admitting: Family Medicine

## 2010-04-06 VITALS — BP 178/82 | HR 72 | Temp 98.3°F | Ht 63.5 in | Wt 193.0 lb

## 2010-04-06 DIAGNOSIS — L738 Other specified follicular disorders: Secondary | ICD-10-CM

## 2010-04-06 DIAGNOSIS — L739 Follicular disorder, unspecified: Secondary | ICD-10-CM

## 2010-04-06 DIAGNOSIS — I10 Essential (primary) hypertension: Secondary | ICD-10-CM

## 2010-04-06 MED ORDER — AMLODIPINE BESYLATE 10 MG PO TABS: 10.0000 mg | ORAL_TABLET | Freq: Every day | ORAL | Status: DC

## 2010-04-06 NOTE — Assessment & Plan Note (Signed)
Pt's BP is elevated today. Restarted Almodipine. She had stopped it in Dec.  Plan to 2 week recheck of BP with RN, then follow up with PCP in 1 month from now.

## 2010-04-06 NOTE — Patient Instructions (Signed)
Restart taking your amlodipine. Your Blood pressure is very high today.  Come back in 2 weeks for a Nurse BP check.

## 2010-04-06 NOTE — Progress Notes (Signed)
  Subjective:    Patient ID: Jessica Hart, female    DOB: 05/03/1940, 70 y.o.   MRN: [id number]  HPI  Folliculitis: Pt has a small amount of blood on the washcloth when she bathed on Sat and has seen one spot of blood when wiping. She says she felt something at the opening to the urethra. She has not had any burning, smell to urine, no increase in urination or any other signs of UTI.   HTN: Pt has BP that is very elevated today. She tried to get of Amlodipine in Dec and her BP has been high since that time. She is not feeling any side effects from the high BP.   Review of Systems ROS neg except as noted in HPI    Objective:   Physical Exam  Constitutional: She appears well-developed and well-nourished.  Genitourinary: Vagina normal. No vaginal discharge found.       One Tiny scabbed over area of folliculitis on the left labia majora.           Assessment & Plan:

## 2010-04-06 NOTE — Assessment & Plan Note (Signed)
Folliculitis: Pt has one lesion of folliculitis that has resolved and is scabbed over. No futher abnormalities of the perineum. Conservative management.

## 2010-04-20 ENCOUNTER — Ambulatory Visit (INDEPENDENT_AMBULATORY_CARE_PROVIDER_SITE_OTHER): Payer: No Typology Code available for payment source | Admitting: *Deleted

## 2010-04-20 DIAGNOSIS — I10 Essential (primary) hypertension: Secondary | ICD-10-CM

## 2010-04-20 NOTE — Progress Notes (Signed)
Patient notified.

## 2010-04-20 NOTE — Progress Notes (Signed)
Thanks!  I think that a follow up is all that is needed.

## 2010-04-20 NOTE — Progress Notes (Signed)
Purewick placed on pt.   Patient for BP check . BP checked manually using large adult cuff.  BP LA 160/70 and RA 166/80. Pulse 80. Patient states she did restart Norvasc after last visit however she has not taken today yet. Usually takes it around 9:00 - 9:30 AM. she will go home and take med now. Advised to schedule appointment with PCP and recheck BP again in 2 weeks.  Will forward to PCP and if further instructions will call patient back at [phone].   also patient reported ear seems to be clogged up .  Offered work in appointment for tomorrow . She will consider and call if desires.

## 2010-05-12 ENCOUNTER — Ambulatory Visit (INDEPENDENT_AMBULATORY_CARE_PROVIDER_SITE_OTHER): Payer: No Typology Code available for payment source | Admitting: Family Medicine

## 2010-05-12 ENCOUNTER — Encounter: Payer: Self-pay | Admitting: Family Medicine

## 2010-05-12 VITALS — BP 130/70 | HR 68 | Temp 98.7°F | Wt 191.0 lb

## 2010-05-12 DIAGNOSIS — E119 Type 2 diabetes mellitus without complications: Secondary | ICD-10-CM

## 2010-05-12 DIAGNOSIS — E669 Obesity, unspecified: Secondary | ICD-10-CM

## 2010-05-12 DIAGNOSIS — H6120 Impacted cerumen, unspecified ear: Secondary | ICD-10-CM | POA: Insufficient documentation

## 2010-05-12 DIAGNOSIS — I10 Essential (primary) hypertension: Secondary | ICD-10-CM

## 2010-05-12 DIAGNOSIS — H6123 Impacted cerumen, bilateral: Secondary | ICD-10-CM

## 2010-05-12 LAB — POCT GLYCOSYLATED HEMOGLOBIN (HGB A1C): Hemoglobin A1C: 7

## 2010-05-12 MED ORDER — GLUCOSE BLOOD VI STRP: ORAL_STRIP | Status: AC

## 2010-05-12 NOTE — Assessment & Plan Note (Signed)
Cerumen impaction today.  Ear wash bilaterally

## 2010-05-12 NOTE — Progress Notes (Signed)
  Subjective:    Patient ID: Jessica Hart, female    DOB: 1940/06/06, 70 y.o.   MRN: [id number]  HPI DM- CBGs 114-150 in AM.  Pt eating ice cream (full sugar, fat) at night.  Taking meds as prescribed.   HTN- taking meds as prescribed.  Not checking BPs at home.   Exercise- has stopped exerciing but would like to get back to it.   Diet- aware of things she can change.  Has been to DM education twice. Non smoker, no family hx of breast CA   Review of Systems    Denies SOB, HA, CP, blurry vision Objective:   Physical Exam    Vital signs reviewed General appearance - alert, well appearing, and in no distress and oriented to person, place, and time Chest - clear to auscultation, no wheezes, rales or rhonchi, symmetric air entry, no tachypnea, retractions or cyanosis Heart - normal rate, regular rhythm, normal S1, S2, no murmurs, rubs, clicks or gallops Diabetic foot exam preformed-no numbness.  Large metatarsal callus on right without metatarsalgia. Bunion formation on right    Assessment & Plan:

## 2010-05-12 NOTE — Assessment & Plan Note (Signed)
BP 130/70, discussed taking readings at home AM and PM and recording for me.  Discussed exercise, pt commits to increasing visits to [facility] to 4x/wk.

## 2010-05-12 NOTE — Assessment & Plan Note (Signed)
Lab Results  Component Value Date   HGBA1C 7.0 05/12/2010   A1C improved today.  Taking meds as prescribed.  AM CBGs between 114-150, depending on what she eats at night.  Discussed protein in snacks, eating at regular intervals, and increasing exercise

## 2010-05-12 NOTE — Patient Instructions (Signed)
It was nice to meet you today I have sent in your testing strips Your A1C was 7.0--this is improved, keep it up!  Consider some protein in your snacks, and keep portions in mind See you in 3 months- you will be going to the [facility] 4x/week by then

## 2010-05-12 NOTE — Assessment & Plan Note (Signed)
Wt Readings from Last 3 Encounters:  05/12/10 191 lb (86.637 kg)  04/06/10 193 lb (87.544 kg)  02/07/10 191 lb 1.6 oz (86.682 kg)   Weight stable but not improved.  Discussed diet and exercise modifications.

## 2010-05-15 LAB — GLUCOSE, CAPILLARY: Glucose-Capillary: 124 mg/dL — ABNORMAL HIGH (ref 70–99)

## 2010-05-17 LAB — GLUCOSE, CAPILLARY: Glucose-Capillary: 590 mg/dL (ref 70–99)

## 2010-06-02 LAB — GLUCOSE, CAPILLARY
Glucose-Capillary: 133 mg/dL — ABNORMAL HIGH (ref 70–99)
Glucose-Capillary: 136 mg/dL — ABNORMAL HIGH (ref 70–99)

## 2010-07-15 NOTE — Op Note (Signed)
NAMECARLO, Jessica Hart NO.:  [id number]   MEDICAL RECORD NO.:  [id number]          PATIENT TYPE:  AMB   LOCATION:  SDS                          FACILITY:  [facility]   PHYSICIAN:  Melrose Nakayama, MD  DATE OF BIRTH:  Aug 29, 1940   DATE OF PROCEDURE:  11/08/2006  DATE OF DISCHARGE:                               OPERATIVE REPORT   PREOPERATIVE DIAGNOSIS:  Right index finger laceration with nerve  involvement.   POSTOPERATIVE DIAGNOSIS:  Right index finger laceration with nerve  involvement.   ATTENDING SURGEON:  Dr. Iran Planas was scrubbed and present for the  entire procedure.   ASSISTANT SURGEON:  None.   PROCEDURE:  1. Right index finger radial digital nerve repair.  2. Use of microscope for digital nerve repair.  3. Repair of laceration right index finger.   TOURNIQUET TIME:  Less than 1 hour 250 mmHg.   SURGICAL ANESTHESIA:  General anesthesia via LMA.   SURGICAL INDICATIONS:  Jessica Hart is a 70 year old female who  sustained a laceration to her dominant right index finger on November 07, 2006.  The patient was seen in the office.  She underwent direct  wound closure but was noted to have absent sensation along the radial  aspect of her index finger distal to the zone of injury.  Risks,  benefits and alternatives were discussed in detail with the patient and  signed informed consent was obtained to proceed on the day of surgery.  Risks include but not limited to bleeding, infection, persistent  numbness, loss of motion of the digit, need for further surgical  intervention.   OPERATIVE FINDINGS:  The patient did have a complete laceration to the  radial digital nerve at the level of the PIP joint.  The flexor  digitorum profundus and flexor digitorum superficialis were both in  continuity.  The laceration did not extend into the flexor tendon  sheath.   DESCRIPTION OF PROCEDURE:  The patient identified in preop holding area.  Mark with permanent  marker was made on the right index finger to  indicate correct operative site.  The patient then brought back to  operating room and placed supine on the anesthesia table where general  anesthesia was administered via LMA.  A well-padded tourniquet was then  placed on the right brachium and sealed with a 1000 drape.  Time-out was  called.  The correct site was identified and the area was then prepped  and draped sterilely.  The patient received preoperative antibiotics  prior to skin incision.   The sutures were then removed.  Those instruments were then passed off  the back table with the sutures.  The patient did have an oblique  laceration that went from volar ulnar and distal radial in a dorsal type  direction and oblique.  This was then extended proximally in a Brunner  type fashion.  Following the limb was then elevated and using Esmarch  exsanguination the tourniquet insufflated. Dissection was carried down  through the skin and subcutaneous tissues.  The flexor tendon sheath was  then identified and both tendons were in continuity  and there was no  laceration of the tendons or the flexor tendon sheath.  The radial  digital nerve and radial digital artery were then both isolated and  identified.  After dissection was carried out of the digital nerve the  microscope was then brought in and even with the use of the microscope  the epineurium and fascicles were then lined up and epineurial repair of  the digital nerve was then carried out using 9-0 nylon suture.  Four  epineural sutures were placed into the nerve to align the fascicles.  Prior to suturing the nerve, the nerve endings were trimmed free of the  hematoma and visualization of what appeared to be healthy fascicles were  identified in order to aid in the alignment and orientation.  Following  repair of the nerve the tourniquet was then deflated.  There was good  perfusion of the digits with good brisk cap refill and good  skin turgor.  The wound was then thoroughly irrigated.  The skin on the laceration was  then repaired and measured 3 cm.  It was repaired with a simple 5-0  nylon sutures.  Where the laceration had been extended was also repaired  with simple 5-0 nylon sutures.  Adaptic dressing was then applied to the  fingers, sterile compressive dressing was then applied.  0.25% Marcaine  10 mL was then used to perform a digital block dorsally.  The finger was  then immobilized keeping the MP and the PIP joint slightly flexed.  The  patient was then extubated and taken to the recovery room in good  condition.  The patient tolerated the procedure well.   POSTOPERATIVE PLAN:  The patient is going to be discharged to home to be  seen back in the office in about 7-10 days.  We will get her down into  therapy for digital nerve rehab protocol looking to immobilize the  finger for about 3 weeks to protect the nerve repair and then begin some  more active motion.      Melrose Nakayama, MD  Electronically Signed     FWO/MEDQ  D:  11/08/2006  T:  11/08/2006  Job:  [id number]

## 2010-07-15 NOTE — Discharge Summary (Signed)
Jessica Hart, HASSINGER NO.:  [id number]   MEDICAL RECORD NO.:  [id number]          PATIENT TYPE:  INP   LOCATION:  4707                         FACILITY:  [facility]   PHYSICIAN:  Blane Ohara McDiarmid, M.D.DATE OF BIRTH:  06-17-1940   DATE OF ADMISSION:  03/23/2005  DATE OF DISCHARGE:  03/24/2005                                 DISCHARGE SUMMARY   PRIMARY CARE PHYSICIAN:  Starleen Blue, M.D.   DISCHARGE DIAGNOSES:  1.  Hyperglycemia.  2.  New onset type 2 diabetes.  3.  Hypertension.  4.  Dyslipidemia.  5.  Tobacco abuse.  6.  Osteoarthritis.   DISCHARGE MEDICATIONS:  1.  Aspirin 81 mg p.o. daily.  2.  Atenolol/chlorthalidone 15/25 mg p.o. daily.  3.  Ibuprofen 800 mg p.o. q.8h. p.r.n.  4.  Lipitor 10 mg p.o. daily.  5.  Nitroglycerin 0.4 mg sublingually p.r.n. chest pain.  6.  Os-Cal 500 plus D two tablets daily.  7.  Glucosamine/chondroitin.  8.  Lantus 20 units q.a.m.  9.  Metformin 500 mg p.o. b.i.d.  10. Tricor 48 mg p.o. daily.   PROCEDURES:  1.  Chest x-ray showed no acute process.  2.  EKG revealed normal sinus rhythm at 92 beats per minute. Patient with      inverted T waves V2 through V4, with minor worsening from prior EKGs. No      ST segment elevation or depression.   ADMISSION LABORATORY DATA:  Sodium 130, potassium 3.8, chloride 99,  bicarbonate 26, BUN 24, creatinine 0.9, glucose 476 with a corrected anion  gap of 13.6.  White blood cell count 16.3, hemoglobin 15.1, hematocrit 43.0,  platelet count 382,000. Initial ABGs of 7.41 with PCO2 of 40.7, PO2 78.0,  bicarbonate 25.9.  Urinalysis showed a specific gravity  of 1.030, pH of  6.0, urine glucose greater than 1000, negative bilirubin, negative ketones,  negative protein, positive nitrites, negative leukocytes, 0-2 white blood  cells, 7-10 red blood cells, and 0 squamous cells.   OTHER IMPORTANT LABORATORY DATA DURING ADMISSION:  Cardiac enzymes were  negative times three.  TSH was within normal limits at 1.631. Blood cultures  were negative. Urine cultures were pending at the time of discharge.  Hemoglobin A1c is pending at the time of discharge. Fasting lipid panel  revealed cholesterol of 172, HDL 25, triglycerides 485, and LDL not  calculated due to elevated triglycerides.   BRIEF HISTORY OF PRESENT ILLNESS:  The patient is a 70 year old female with  a past medical history significant for hypertension and angina who had had  significant polyuria and polydipsia for the three days. She went to the  nursing station at work and her blood sugar was found to be greater than  600. She was without abdominal pain, chest pain, or shortness of breath. She  had been feeling lightheaded. In the emergency department, she was found to  have a blood sugar of 637 with an anion gap of 13.4. She is without ketones  in her urine and she is not in acidosis by her ABG. The patient did have  some minor T-wave changes in  her EKG. The patient was admitted to the  hospital.   HOSPITAL COURSE:  Problem #1:  Diabetes mellitus. The patient was started on  Glucommander while in the ER. The Glucommander was discontinued on the  evening of admission. The patient was started on Lantus 10 units in the  morning of January 26th. The patient resumed normal diet that morning as  well. Her last blood sugar was 290.  The patient will be discharged on  Lantus 20 units q.a.m. as well as metformin 500 mg p.o. b.i.d. The patient  received excessive diabetic teaching while here in the hospital and she is  given referral to the diabetic education center. The patient was instructed  to report all of her fasting blood sugars before meals and bring them with  her to her follow-up appointment with Tereasa Coop on Monday, March 24, 2005.   Problem #2:  Hypertension. The patient's blood pressure was well controlled  in the hospital on her atenolol. Her chlorthalidone was held due to her  likely  dehydration from hyperglycemia. She will be restarted on Tylenol and  chlorthalidone. The patient now has diabetes and her goal blood pressure is  130/80. Consider switching to an ACE inhibitor as an outpatient as she  begins to show micro albuminuria.   Problem #3:  Hyperlipidemia. The patient's lipid panel as above with her  triglycerides greater than 400. The patient will be started on Tricor 48 mg  p.o. daily and will continue her Lipitor as well. Closer monitoring of her  lipid panel as an outpatient is recommended once her diabetes is under  better control.   Problem #4:  Tobacco abuse. The patient received a smoking cessation consult  while here in the hospital.   FOLLOWUP APPOINTMENTS:  Tereasa Coop at the [facility], phone  number [phone], on Monday,  March 27, 2005, at 11:15 a.m.   FOLLOWUP INSTRUCTIONS:  The patient is instructed to record her fasting  blood sugars and bring these with her to her appointment so that her  medications can be adjusted.      Clifton Custard, M.D.    ______________________________  Blane Ohara McDiarmid, M.D.    MR/MEDQ  D:  03/24/2005  T:  03/24/2005  Job:  [id number]

## 2010-07-28 ENCOUNTER — Ambulatory Visit (INDEPENDENT_AMBULATORY_CARE_PROVIDER_SITE_OTHER): Payer: No Typology Code available for payment source | Admitting: Home Health Services

## 2010-07-28 ENCOUNTER — Encounter: Payer: Self-pay | Admitting: Home Health Services

## 2010-07-28 VITALS — BP 139/77 | Temp 98.2°F | Ht 64.0 in | Wt 191.2 lb

## 2010-07-28 DIAGNOSIS — Z0000 Encounter for general adult medical examination without abnormal findings: Secondary | ICD-10-CM

## 2010-07-28 NOTE — Progress Notes (Signed)
Patient here for annual wellness visit, patient reports: Risk Factors/Conditions needing evaluation or treatment: Patient does not have any risk factors that need evaluation. Home Safety: Patient lives in 1 story home with husband,  Patient reports having smoke detectors and does not have adaptive equipment in bathroom.  Other Information: Corrective lens: Patient wears corrective lens daily and visits eye doctor annually.  Dentures: Patient has upper dentures and visits dentist as needed. Memory: Patient reports some memory problems.  Has to read things several times to understand. Patient's Mini Mental Score (recorded in doc. flowsheet): 30  Balance/Gait: Patients does not have any abnormal balance/gait characteristics.  Balance Abnormal Patient value  Sitting balance    Sit to stand    Attempts to arise    Immediate standing balance    Standing balance    Nudge    Eyes closed- Romberg    Tandem stance    Back lean    Neck Rotation    360 degree turn    Sitting down     Gait Abnormal Patient value  Initiation of gait    Step length-left    Step length-right    Step height-left    Step height-right    Step symmetry    Step continuity    Path deviation    Trunk movement    Walking stance        Annual Wellness Visit Requirements Recorded Today In  Medical, family, social history Past Medical, Family, Social History Section  Current providers Care team  Current medications Medications  Wt, BP, Ht, BMI Vital signs  Hearing assessment (welcome visit) declined  Tobacco, alcohol, illicit drug use History  ADL Nurse Assessment  Depression Screening Nurse Assessment  Cognitive impairment Nurse Assessment  Mini Mental Status Document Flowsheet  Fall Risk Nurse Assessment  Home Safety Progress Note  End of Life Planning (welcome visit) Social Documentation  Medicare preventative services Progress Note  Risk factors/conditions needing evaluation/treatment Progress Note    Personalized health advice Patient Instructions, goals, letter  Diet & Exercise Social Documentation  Emergency Contact Social Documentation  Seat Belts Social Documentation  Sun exposure/protection Social Documentation    Prevention Plan: Recommended patient contact pharmacy for shingles vaccine.   Recommended Medicare Prevention Screenings Women over 52 Test For Frequency Date of Last- BOLD if needed  Breast Cancer 1-2 yrs 1/11  Cervical Cancer 1-3 yrs 3/05  Colorectal Cancer 1-10 yrs 10/10  Osteoporosis once 8/06  Cholesterol 5 yrs 12/11  Diabetes yearly 3/12  HIV yearly declined  Influenza Shot yearly 10/09  Pneumonia Shot once 10/09  Zostavax Shot once recommended

## 2010-07-28 NOTE — Patient Instructions (Signed)
1. Focus on eating 3-4 vegetables a day and 3 small meals with 2-3 protein snacks a day. 2. Focus on going back to the [employer] at least 2 times a week.  3. Be sure to follow up with your eye doctor this fall.  4. Contact pharmacy for shingles vaccine. 5. Complete living will and bring in copy for Dr. Charlett Blake. 6. Follow up with Dr. Charlett Blake for diabetes 6/18.

## 2010-08-05 ENCOUNTER — Other Ambulatory Visit: Payer: Self-pay | Admitting: Family Medicine

## 2010-08-05 DIAGNOSIS — I10 Essential (primary) hypertension: Secondary | ICD-10-CM

## 2010-08-05 MED ORDER — AMLODIPINE BESYLATE 10 MG PO TABS: 10.0000 mg | ORAL_TABLET | Freq: Every day | ORAL | Status: DC

## 2010-08-05 MED ORDER — LOSARTAN POTASSIUM 25 MG PO TABS: 25.0000 mg | ORAL_TABLET | Freq: Every day | ORAL | Status: DC

## 2010-08-15 ENCOUNTER — Encounter: Payer: Self-pay | Admitting: Family Medicine

## 2010-08-15 ENCOUNTER — Ambulatory Visit (INDEPENDENT_AMBULATORY_CARE_PROVIDER_SITE_OTHER): Payer: No Typology Code available for payment source | Admitting: Family Medicine

## 2010-08-15 ENCOUNTER — Encounter: Payer: Self-pay | Admitting: Home Health Services

## 2010-08-15 VITALS — BP 151/74 | Temp 97.9°F | Ht 64.0 in | Wt 190.0 lb

## 2010-08-15 DIAGNOSIS — E119 Type 2 diabetes mellitus without complications: Secondary | ICD-10-CM

## 2010-08-15 DIAGNOSIS — I10 Essential (primary) hypertension: Secondary | ICD-10-CM

## 2010-08-15 DIAGNOSIS — E785 Hyperlipidemia, unspecified: Secondary | ICD-10-CM

## 2010-08-15 DIAGNOSIS — K219 Gastro-esophageal reflux disease without esophagitis: Secondary | ICD-10-CM

## 2010-08-15 MED ORDER — LOSARTAN POTASSIUM 25 MG PO TABS: 25.0000 mg | ORAL_TABLET | Freq: Every day | ORAL | Status: DC

## 2010-08-15 MED ORDER — METFORMIN HCL 1000 MG PO TABS: 1000.0000 mg | ORAL_TABLET | Freq: Two times a day (BID) | ORAL | Status: DC

## 2010-08-15 MED ORDER — PRAVASTATIN SODIUM 40 MG PO TABS: 40.0000 mg | ORAL_TABLET | Freq: Every day | ORAL | Status: DC

## 2010-08-15 MED ORDER — METOPROLOL TARTRATE 100 MG PO TABS: 100.0000 mg | ORAL_TABLET | Freq: Two times a day (BID) | ORAL | Status: DC

## 2010-08-15 MED ORDER — GLIPIZIDE 5 MG PO TABS: 5.0000 mg | ORAL_TABLET | Freq: Two times a day (BID) | ORAL | Status: DC

## 2010-08-15 MED ORDER — IBUPROFEN 800 MG PO TABS: 800.0000 mg | ORAL_TABLET | Freq: Every day | ORAL | Status: DC

## 2010-08-15 MED ORDER — OMEPRAZOLE 20 MG PO CPDR: 20.0000 mg | DELAYED_RELEASE_CAPSULE | Freq: Every day | ORAL | Status: DC

## 2010-08-15 MED ORDER — AMLODIPINE BESYLATE 10 MG PO TABS: 10.0000 mg | ORAL_TABLET | Freq: Every day | ORAL | Status: DC

## 2010-08-15 NOTE — Patient Instructions (Signed)
Please come back in 3 months!  You will work on your weight, your salt intake, your carb intake (regular throughout the day, but limited), and exercise  Goal is 5 lbs/month

## 2010-08-15 NOTE — Progress Notes (Signed)
I have reviewed this visit and discussed with Jessica Hart and agree with her documentation

## 2010-08-18 LAB — COMPREHENSIVE METABOLIC PANEL
ALT: 56 U/L — ABNORMAL HIGH (ref 0–35)
Albumin: 4.3 g/dL (ref 3.5–5.2)
CO2: 24 mEq/L (ref 19–32)
Calcium: 9.9 mg/dL (ref 8.4–10.5)
Chloride: 101 mEq/L (ref 96–112)
Glucose, Bld: 357 mg/dL — ABNORMAL HIGH (ref 70–99)
Sodium: 137 mEq/L (ref 135–145)
Total Bilirubin: 0.4 mg/dL (ref 0.3–1.2)
Total Protein: 7.3 g/dL (ref 6.0–8.3)

## 2010-08-19 NOTE — Progress Notes (Signed)
GERD- complaining of heartburn symptoms.  No changes in diet.  Taking zantac.   HYPERTENSION  Disease Monitoring: Blood pressure range- BP Readings from Last 3 Encounters:  08/15/10 151/74  07/28/10 139/77  05/12/10 130/70    Chest pain- no       Dyspnea- no  Medications: Compliance- good Lightheadedness- none   Edema- none   DIABETES  Disease Monitoring:  Lab Results  Component Value Date   HGBA1C 9.1 08/15/2010    Blood Sugar ranges-200s Polyuria- none New Visual problems- none  Medications: Compliance- good Hypoglycemic symptoms- none    HYPERLIPIDEMIA  Disease Monitoring: Lab Results  Component Value Date   CHOL 146 02/08/2010   CHOL 170 09/02/2008   Lab Results  Component Value Date   HDL 42 02/08/2010   HDL 40 09/02/2008   Lab Results  Component Value Date   LDLCALC 80 02/08/2010   LDLCALC 90 09/02/2008   Lab Results  Component Value Date   TRIG 121 02/08/2010   TRIG 199* 09/02/2008   Lab Results  Component Value Date   CHOLHDL 3.5 Ratio 02/08/2010   CHOLHDL 4.3 Ratio 09/02/2008   Lab Results  Component Value Date   LDLDIRECT 99 08/15/2010   LDLDIRECT 114* 09/10/2007   LDLDIRECT 92 03/26/2007      See symptoms for Hypertension  Medications: Compliance- good  RUQ pain- none  Muscle aches- none    ROS See HPI above   PMH Smoking Status noted

## 2010-08-19 NOTE — Assessment & Plan Note (Signed)
On pravachol, will check lipids today

## 2010-08-19 NOTE — Assessment & Plan Note (Signed)
Lab Results  Component Value Date   HGBA1C 9.1 08/15/2010   Pt with worsended A1C, has plan to change diet.  Has been to diabetes education and does not want to go back.  Pt wants to hold off on starting a new med for 3 months.

## 2010-08-19 NOTE — Assessment & Plan Note (Signed)
Still having symptoms.  Will increase to omeprazole and see if there is a change.

## 2010-08-19 NOTE — Assessment & Plan Note (Signed)
BP up but was better last visit  Will hold off on changing meds for now. BP Readings from Last 3 Encounters:  08/15/10 151/74  07/28/10 139/77  05/12/10 130/70

## 2010-08-25 ENCOUNTER — Encounter: Payer: Self-pay | Admitting: Family Medicine

## 2010-09-12 ENCOUNTER — Telehealth: Payer: Self-pay | Admitting: Family Medicine

## 2010-09-12 NOTE — Telephone Encounter (Signed)
When Jessica Hart received her rxs for refills the Glipizide was not among them.  Please send request to [facility] on [address].

## 2010-09-13 NOTE — Telephone Encounter (Signed)
Rx re-sent, patient informed.

## 2010-11-03 ENCOUNTER — Emergency Department (HOSPITAL_COMMUNITY)
Admission: EM | Admit: 2010-11-03 | Discharge: 2010-11-03 | Disposition: A | Discharge status: Home / Self Care | Payer: Medicare (Managed Care) | Attending: Emergency Medicine | Admitting: Emergency Medicine

## 2010-11-03 DIAGNOSIS — I10 Essential (primary) hypertension: Secondary | ICD-10-CM | POA: Insufficient documentation

## 2010-11-03 DIAGNOSIS — Z79899 Other long term (current) drug therapy: Secondary | ICD-10-CM | POA: Insufficient documentation

## 2010-11-03 DIAGNOSIS — R300 Dysuria: Secondary | ICD-10-CM | POA: Insufficient documentation

## 2010-11-03 DIAGNOSIS — E119 Type 2 diabetes mellitus without complications: Secondary | ICD-10-CM | POA: Insufficient documentation

## 2010-11-03 DIAGNOSIS — N390 Urinary tract infection, site not specified: Secondary | ICD-10-CM | POA: Insufficient documentation

## 2010-11-03 LAB — URINALYSIS, ROUTINE W REFLEX MICROSCOPIC
Nitrite: POSITIVE — AB
Specific Gravity, Urine: 1.018 (ref 1.005–1.030)
Urobilinogen, UA: 2 mg/dL — ABNORMAL HIGH (ref 0.0–1.0)

## 2010-11-03 LAB — URINE MICROSCOPIC-ADD ON

## 2010-11-03 LAB — GLUCOSE, CAPILLARY: Glucose-Capillary: 125 mg/dL — ABNORMAL HIGH (ref 70–99)

## 2010-11-04 ENCOUNTER — Ambulatory Visit: Payer: No Typology Code available for payment source | Admitting: Family Medicine

## 2010-11-16 ENCOUNTER — Ambulatory Visit (INDEPENDENT_AMBULATORY_CARE_PROVIDER_SITE_OTHER): Payer: No Typology Code available for payment source | Admitting: Family Medicine

## 2010-11-16 ENCOUNTER — Encounter: Payer: Self-pay | Admitting: Family Medicine

## 2010-11-16 VITALS — BP 127/73 | HR 74 | Temp 98.3°F | Ht 64.0 in | Wt 185.2 lb

## 2010-11-16 DIAGNOSIS — I10 Essential (primary) hypertension: Secondary | ICD-10-CM

## 2010-11-16 DIAGNOSIS — E785 Hyperlipidemia, unspecified: Secondary | ICD-10-CM

## 2010-11-16 DIAGNOSIS — E119 Type 2 diabetes mellitus without complications: Secondary | ICD-10-CM

## 2010-11-16 MED ORDER — METFORMIN HCL 1000 MG PO TABS: 1000.0000 mg | ORAL_TABLET | Freq: Two times a day (BID) | ORAL | Status: DC

## 2010-11-16 MED ORDER — ROSUVASTATIN CALCIUM 20 MG PO TABS: 20.0000 mg | ORAL_TABLET | Freq: Every day | ORAL | Status: DC

## 2010-11-16 NOTE — Assessment & Plan Note (Addendum)
Lab Results  Component Value Date   HGBA1C 7.0 11/16/2010   Improved.  Continue current treatment.  In 3 months may be able to take off glipizide if pt continues diet and exercise.

## 2010-11-16 NOTE — Patient Instructions (Signed)
Your diabetes number is 7.0 today!  Better than I thought.  Stick with your current regimen  I have give you a crestor rx.  Call and let me know if you switch  Please add fiber to your diet (1 dose/day)

## 2010-11-17 NOTE — Assessment & Plan Note (Signed)
At goal.  Improved diet.  Will continue same regimen.

## 2010-11-17 NOTE — Progress Notes (Signed)
  Subjective:    Patient ID: Jessica Hart, female    DOB: Jan 04, 1941, 70 y.o.   MRN: [id number]  HPI  HYPERTENSION Disease Monitoring Blood pressure range-130s and below Chest pain- none      Dyspnea- none Medications Compliance- good Lightheadedness- none   Edema- none made diet changes and is exercising some Having some diarrhea with certain foods, she will take immodium if it gets bad enough.  No blood in stool.   DIABETES Disease Monitoring Blood Sugar ranges-130s in AM Polyuria- none  New Visual problems- none Medications Compliance- good, taking without missed doses Hypoglycemic symptoms- none   HYPERLIPIDEMIA Disease Monitoring See symptoms for Hypertension Medications Compliance- good RUQ pain- none  Muscle aches- none pt desires to switch to crestor because her husband is on it.    PMH Smoking Status noted    Review of Systems    See HPI above  Objective:   Physical Exam Vital signs reviewed General appearance - alert, well appearing, and in no distress and oriented to person, place, and time Heart - normal rate, regular rhythm, normal S1, S2, no murmurs, rubs, clicks or gallops Chest - clear to auscultation, no wheezes, rales or rhonchi, symmetric air entry, no tachypnea, retractions or cyanosis Abdomen - soft, nontender, nondistended, no masses or organomegaly Feet are without lesions or worrisome callouses       Assessment & Plan:

## 2010-11-17 NOTE — Assessment & Plan Note (Signed)
Pt desires switch to crestor though no problems with pravastatin.  Will write for crestor.  Will recheck lipids in 3 months.

## 2010-12-09 LAB — CBC
HCT: 36.4
Platelets: 320
WBC: 11.6 — ABNORMAL HIGH

## 2010-12-09 LAB — BASIC METABOLIC PANEL
BUN: 9
Calcium: 9.3
GFR calc non Af Amer: >60
Potassium: 3.4 — ABNORMAL LOW

## 2011-04-07 ENCOUNTER — Encounter: Payer: Self-pay | Admitting: Family Medicine

## 2011-04-07 ENCOUNTER — Ambulatory Visit (INDEPENDENT_AMBULATORY_CARE_PROVIDER_SITE_OTHER): Payer: Medicare Other | Admitting: Family Medicine

## 2011-04-07 VITALS — BP 138/76 | HR 77 | Temp 98.3°F | Ht 64.0 in | Wt 193.0 lb

## 2011-04-07 DIAGNOSIS — E785 Hyperlipidemia, unspecified: Secondary | ICD-10-CM

## 2011-04-07 DIAGNOSIS — M1990 Unspecified osteoarthritis, unspecified site: Secondary | ICD-10-CM

## 2011-04-07 DIAGNOSIS — E119 Type 2 diabetes mellitus without complications: Secondary | ICD-10-CM

## 2011-04-07 DIAGNOSIS — I10 Essential (primary) hypertension: Secondary | ICD-10-CM

## 2011-04-07 LAB — POCT GLYCOSYLATED HEMOGLOBIN (HGB A1C): Hemoglobin A1C: 7.1

## 2011-04-07 LAB — LIPID PANEL
Cholesterol: 150 mg/dL (ref 0–200)
HDL: 36 mg/dL — ABNORMAL LOW (ref >39)
Triglycerides: 243 mg/dL — ABNORMAL HIGH (ref <150)

## 2011-04-07 MED ORDER — AMLODIPINE BESYLATE 10 MG PO TABS: 10.0000 mg | ORAL_TABLET | Freq: Every day | ORAL | Status: DC

## 2011-04-07 MED ORDER — METFORMIN HCL 1000 MG PO TABS: 1000.0000 mg | ORAL_TABLET | Freq: Two times a day (BID) | ORAL | Status: DC

## 2011-04-07 MED ORDER — METOPROLOL TARTRATE 100 MG PO TABS: 100.0000 mg | ORAL_TABLET | Freq: Two times a day (BID) | ORAL | Status: DC

## 2011-04-07 MED ORDER — LOSARTAN POTASSIUM 25 MG PO TABS: 25.0000 mg | ORAL_TABLET | Freq: Every day | ORAL | Status: DC

## 2011-04-07 MED ORDER — GLIPIZIDE 5 MG PO TABS: 5.0000 mg | ORAL_TABLET | Freq: Two times a day (BID) | ORAL | Status: DC

## 2011-04-07 MED ORDER — PRAVASTATIN SODIUM 40 MG PO TABS: 40.0000 mg | ORAL_TABLET | Freq: Every day | ORAL | Status: DC

## 2011-04-07 NOTE — Patient Instructions (Signed)
Come back and see me in 3 months  Start with some gentle exercise Continue your medications except ibuprofen Start taking tylenol 1000 mg (2 extra strength) 2-3 times a day

## 2011-04-07 NOTE — Assessment & Plan Note (Signed)
Change to Tylenol for Motrin for kidney and high blood pressure protection. Patient states thousand milligrams of Tylenol 3 times a day. Call if not helpful. Consider adding tramadol.

## 2011-04-07 NOTE — Assessment & Plan Note (Signed)
BP Readings from Last 3 Encounters:  04/07/11 138/76  11/16/10 127/73  08/15/10 151/74   Blood pressure meds today. At goal. Continue current management.

## 2011-04-07 NOTE — Progress Notes (Signed)
  Subjective:    Patient ID: Jessica Hart, female    DOB: 02/05/1941, 71 y.o.   MRN: [id number]  HPI HYPERTENSION Disease Monitoring Blood pressure range-doesn't check at home Chest pain- none      Dyspnea- none Medications Compliance- taking as prescribed Lightheadedness- none   Edema- none   DIABETES Disease Monitoring Blood Sugar ranges-not checking at home Polyuria- none New Visual problems- none Medications Compliance- taking her medications as prescribed Hypoglycemic symptoms- none   HYPERLIPIDEMIA Disease Monitoring See symptoms for Hypertension Medications Compliance- taking Pravachol RUQ pain- none  Muscle aches- none   Arthritis-taking ibuprofen. Has been out of her ibuprofen taken Aleve. Most of her pain is in her hands. She would be willing to consider trying Tylenol for this. ROS See HPI above   PMH Smoking Status noted     Review of Systems See above    Objective:   Physical Exam  Vital signs reviewed General appearance - alert, well appearing, and in no distress and oriented to person, place, and time Heart - normal rate, regular rhythm, normal S1, S2, no murmurs, rubs, clicks or gallops Chest - clear to auscultation, no wheezes, rales or rhonchi, symmetric air entry, no tachypnea, retractions or cyanosis Extremities - peripheral pulses normal, no pedal edema, no clubbing or cyanosis Hands-no synovial thickening, no nodules      Assessment & Plan:

## 2011-04-07 NOTE — Assessment & Plan Note (Signed)
Lab Results  Component Value Date   HGBA1C 7.1 04/07/2011   Slightly worse today. Continue current management as patient is planning to start an exercise program. See back in 3 months. Consider increasing glipizide at that time.

## 2011-04-07 NOTE — Assessment & Plan Note (Signed)
Check lipids today. She is now on Pravachol again and off the Crestor because it was too expensive.

## 2011-04-10 ENCOUNTER — Encounter: Payer: Self-pay | Admitting: Family Medicine

## 2011-08-07 ENCOUNTER — Encounter: Payer: Self-pay | Admitting: Home Health Services

## 2011-09-05 ENCOUNTER — Ambulatory Visit (INDEPENDENT_AMBULATORY_CARE_PROVIDER_SITE_OTHER): Payer: Medicare Other | Admitting: Family Medicine

## 2011-09-05 ENCOUNTER — Encounter: Payer: Self-pay | Admitting: Family Medicine

## 2011-09-05 VITALS — BP 137/74 | HR 79 | Temp 98.0°F | Ht 64.0 in | Wt 190.0 lb

## 2011-09-05 DIAGNOSIS — E119 Type 2 diabetes mellitus without complications: Secondary | ICD-10-CM

## 2011-09-05 DIAGNOSIS — K219 Gastro-esophageal reflux disease without esophagitis: Secondary | ICD-10-CM

## 2011-09-05 DIAGNOSIS — I10 Essential (primary) hypertension: Secondary | ICD-10-CM

## 2011-09-05 LAB — COMPREHENSIVE METABOLIC PANEL
Albumin: 4.4 g/dL (ref 3.5–5.2)
BUN: 10 mg/dL (ref 6–23)
CO2: 26 mEq/L (ref 19–32)
Calcium: 10.2 mg/dL (ref 8.4–10.5)
Chloride: 105 mEq/L (ref 96–112)
Creat: 0.8 mg/dL (ref 0.50–1.10)
Glucose, Bld: 106 mg/dL — ABNORMAL HIGH (ref 70–99)
Potassium: 4 mEq/L (ref 3.5–5.3)

## 2011-09-05 MED ORDER — RANITIDINE HCL 150 MG PO CAPS: 150.0000 mg | ORAL_CAPSULE | Freq: Every evening | ORAL | Status: DC

## 2011-09-05 NOTE — Patient Instructions (Addendum)
Jessica Hart,  It was a pleasure meeting you today. Thank you very much for coming in today. Your A1c is trending down it is now 6.8. You may decrease metformin to 1000 mg in the morning and 500 mg at night.   Please f/u in 3 months. Come back fasting and we will check your lipid panel and your A1c again.  Continue the exercise, it will help keep your sugar under control and your cholesterol. Pease schedule your mammogram.   Dr. Adrian Blackwater

## 2011-09-07 ENCOUNTER — Encounter: Payer: Self-pay | Admitting: Family Medicine

## 2011-09-07 MED ORDER — METFORMIN HCL 500 MG PO TABS: ORAL_TABLET | ORAL | Status: DC

## 2011-09-07 NOTE — Assessment & Plan Note (Signed)
A: improved. A1c at goal.  Lab Results  Component Value Date   HGBA1C 6.8 09/05/2011  Meds: compliant. P:  -decrease metformin to 1000 mg q Am and 500 mg q PM -continue glipizide as prescribed.

## 2011-09-07 NOTE — Assessment & Plan Note (Signed)
Start zantac.

## 2011-09-07 NOTE — Assessment & Plan Note (Signed)
A: above goal of 130/80 today.  P:  -will follow. -may need to increase norvasc keep in mind this can induce peripheral edema.

## 2011-09-07 NOTE — Progress Notes (Signed)
Subjective:     Patient ID: Jessica Hart, female   DOB: 1940/04/20, 71 y.o.   MRN: [id number]  HPI 71 yo F presents for f/u to discuss the following: 1. HTN: compliant with all medications. Denies CP,SOB and LE edema. She feels like she has some hand edema and is interested in lasix. Does not check BP at home.  2. DM: compliant with medications .Does not monitor home CBGs. Denies symptoms of hypoglycemia. Admits to waking up to urinate 3x per night.  3. Heartburn: never received PPI. She is still having heartburn usually when she has gone a longtime between meals.  4. HLD: stopped taking pravastatin as she felt it was giving her dry mouth. Denies RUQ pain and myalgias.   Medical history: patient with DM2 x 6 years. She used insulin at the beginning.  Review of Systems As per HPI    Objective:   Physical Exam BP 137/74  Pulse 79  Temp 98 F (36.7 C) (Oral)  Ht _0  (1.626 m)  Wt 190 lb (86.183 kg)  BMI 32.61 kg/m2 General appearance: alert, cooperative and no distress Lungs: clear to auscultation bilaterally Heart: regular rate and rhythm, S1, S2 normal, no murmur, click, rub or gallop Abdomen: soft, non-tender; bowel sounds normal; no masses,  no organomegaly Extremities: extremities normal, atraumatic, no cyanosis or edema. No hand edema.  Neurologic: Grossly normal    Assessment and Plan:

## 2011-10-26 ENCOUNTER — Encounter: Payer: Self-pay | Admitting: Home Health Services

## 2011-10-26 ENCOUNTER — Ambulatory Visit (INDEPENDENT_AMBULATORY_CARE_PROVIDER_SITE_OTHER): Payer: Medicare Other | Admitting: Home Health Services

## 2011-10-26 VITALS — BP 158/80 | HR 62 | Temp 98.5°F | Ht 64.0 in | Wt 188.6 lb

## 2011-10-26 DIAGNOSIS — Z0000 Encounter for general adult medical examination without abnormal findings: Secondary | ICD-10-CM

## 2011-10-26 NOTE — Patient Instructions (Addendum)
1. Consider starting a regular exercise routine. 2. Continue to work on weight loss.  3. Focus on eating 3-5 fruits and vegetables a day.

## 2011-10-26 NOTE — Progress Notes (Signed)
Patient here for annual wellness visit, patient reports: Risk Factors/Conditions needing evaluation or treatment: Pt does not have any new risk factors that need evaluation. Home Safety: Pt lives with husband in 1 story home. Pt reports having smoke detectors. Other Information: Corrective lens:  Pt wears daily corrective lens.  Pt visits eye dr annually. Dentures: Pt has full upper and partial bottom dentures. Does not have regular dentist.  Memory: Pt denies memory problems. Patient's Mini Mental Score (recorded in doc. flowsheet): 30  Balance/Gait: Pt does not have any noticeable impairment. Balance Abnormal Patient value  Sitting balance    Sit to stand    Attempts to arise    Immediate standing balance    Standing balance    Nudge    Eyes closed- Romberg    Tandem stance    Back lean    Neck Rotation    360 degree turn    Sitting down     Gait Abnormal Patient value  Initiation of gait    Step length-left    Step length-right    Step height-left    Step height-right    Step symmetry    Step continuity    Path deviation    Trunk movement    Walking stance        Annual Wellness Visit Requirements Recorded Today In  Medical, family, social history Past Medical, Family, Social History Section  Current providers Care team  Current medications Medications  Wt, BP, Ht, BMI Vital signs  Tobacco, alcohol, illicit drug use History  ADL Nurse Assessment  Depression Screening Nurse Assessment  Cognitive impairment Nurse Assessment  Mini Mental Status Document Flowsheet  Fall Risk Nurse Assessment  Home Safety Progress Note  End of Life Planning (welcome visit) Social Documentation  Medicare preventative services Progress Note  Risk factors/conditions needing evaluation/treatment Progress Note  Personalized health advice Patient Instructions, goals, letter  Diet & Exercise Social Documentation  Emergency Contact Social Documentation  Seat Belts Social Documentation   Sun exposure/protection Social Documentation    Prevention Plan:   Recommended Medicare Prevention Screenings Women over 65 Test For Frequency Date of Last- BOLD if needed  Breast Cancer 1-2 yrs 1/11  Cervical Cancer 1-3 yrs NI-due to age  Colorectal Cancer 1-10 yrs 10/10  Osteoporosis once 8/06  Cholesterol 5 yrs 2/13  Diabetes yearly 7/13  HIV yearly declined  Influenza Shot yearly 10/09  Pneumonia Shot once 10/09  Zostavax Shot once Pt not interested

## 2011-10-27 ENCOUNTER — Encounter: Payer: Self-pay | Admitting: Home Health Services

## 2011-10-27 NOTE — Progress Notes (Signed)
Patient ID: Jessica Hart, female   DOB: 1940-04-20, 71 y.o.   MRN: [id number]  I have reviewed this visit and discussed with Lamont Dowdy and agree with her documentation.

## 2011-12-29 ENCOUNTER — Other Ambulatory Visit: Payer: Self-pay | Admitting: *Deleted

## 2011-12-29 DIAGNOSIS — E119 Type 2 diabetes mellitus without complications: Secondary | ICD-10-CM

## 2011-12-29 MED ORDER — METFORMIN HCL 500 MG PO TABS: ORAL_TABLET | ORAL | Status: DC

## 2012-03-27 ENCOUNTER — Ambulatory Visit (INDEPENDENT_AMBULATORY_CARE_PROVIDER_SITE_OTHER): Payer: Medicare Other | Admitting: Family Medicine

## 2012-03-27 ENCOUNTER — Encounter: Payer: Self-pay | Admitting: Family Medicine

## 2012-03-27 VITALS — BP 148/63 | HR 75 | Temp 98.3°F | Ht 64.0 in | Wt 186.9 lb

## 2012-03-27 DIAGNOSIS — Z0000 Encounter for general adult medical examination without abnormal findings: Secondary | ICD-10-CM

## 2012-03-27 DIAGNOSIS — E785 Hyperlipidemia, unspecified: Secondary | ICD-10-CM

## 2012-03-27 DIAGNOSIS — R300 Dysuria: Secondary | ICD-10-CM

## 2012-03-27 DIAGNOSIS — N952 Postmenopausal atrophic vaginitis: Secondary | ICD-10-CM

## 2012-03-27 DIAGNOSIS — N390 Urinary tract infection, site not specified: Secondary | ICD-10-CM

## 2012-03-27 DIAGNOSIS — E119 Type 2 diabetes mellitus without complications: Secondary | ICD-10-CM

## 2012-03-27 DIAGNOSIS — K219 Gastro-esophageal reflux disease without esophagitis: Secondary | ICD-10-CM

## 2012-03-27 LAB — POCT GLYCOSYLATED HEMOGLOBIN (HGB A1C): Hemoglobin A1C: 9.8

## 2012-03-27 LAB — POCT URINALYSIS DIPSTICK
Glucose, UA: 500
Nitrite, UA: NEGATIVE
Protein, UA: 30
Urobilinogen, UA: 1

## 2012-03-27 LAB — POCT UA - MICROSCOPIC ONLY

## 2012-03-27 MED ORDER — METFORMIN HCL 1000 MG PO TABS: ORAL_TABLET | ORAL | Status: DC

## 2012-03-27 MED ORDER — CEPHALEXIN 500 MG PO CAPS: 500.0000 mg | ORAL_CAPSULE | Freq: Three times a day (TID) | ORAL | Status: AC

## 2012-03-27 MED ORDER — GLIPIZIDE 5 MG PO TABS: 5.0000 mg | ORAL_TABLET | Freq: Two times a day (BID) | ORAL | Status: DC

## 2012-03-27 MED ORDER — ESOMEPRAZOLE MAGNESIUM 40 MG PO CPDR: 40.0000 mg | DELAYED_RELEASE_CAPSULE | Freq: Every day | ORAL | Status: DC

## 2012-03-27 MED ORDER — ESTROGENS, CONJUGATED 0.625 MG/GM VA CREA: TOPICAL_CREAM | Freq: Every day | VAGINAL | Status: DC

## 2012-03-27 NOTE — Patient Instructions (Addendum)
Mrs. Daw,   Thank you for coming in today.  Please continue current medications with the following changes:  1. Increase metformin to 1000 mg twice daily 2. Take keflex every 8 hrs for next week for UTI 3. Apply vaginal estrogen cream (premarin) to vaginal and around urethra daily    For your diet:  1. Make sure to eat breakfast, lunch and dinner (also may add one snack mid morning or mid afternoon).  2. Carbs: no more than 2 servings (30 gram/2oz) per meal and 1 serving per snack.  3. Exercise such that you sweat some and your heart rate goes up most days of the week.  4. Water, water, water  5. Check blood sugar 2-3 x per week to get a feel for how different foods effect your blood sugar:  Goal fasting <100  Goal after eating < 200  Please schedule a mammogram.  F/u in 3 months for diabetes follow up   Dr. Adrian Blackwater

## 2012-03-28 ENCOUNTER — Telehealth: Payer: Self-pay | Admitting: *Deleted

## 2012-03-28 NOTE — Telephone Encounter (Signed)
Patient came in office this afternoon with several guestions about medications.    She had refills current at pharmacy for losartan, pravastatin, and amlopidine. Called  and told them to refill. However this is the last refill she can get before 04/06/2012 and needs meds updated so next month she will be able to get.    She needs directions for metformin clarified .  States on her AVS metformin 1000 mg twice daily but on RX states 1 in AM and 1/2 in PM.     Premarin is too expensive,  cost $40.00 with copay.  .  Couldn't get.  Can MD send in something else. I told her also to check with her insurance to ask what is on their formulary comparable to Premarin.    Could not afford Nexium , will have to go back to Ranitidine. Please send in RX .    Call back [phone].

## 2012-03-29 ENCOUNTER — Telehealth: Payer: Self-pay | Admitting: Family Medicine

## 2012-03-29 MED ORDER — METFORMIN HCL 1000 MG PO TABS: 1000.0000 mg | ORAL_TABLET | Freq: Two times a day (BID) | ORAL | Status: DC

## 2012-03-29 MED ORDER — RANITIDINE HCL 150 MG PO TABS: 150.0000 mg | ORAL_TABLET | Freq: Every day | ORAL | Status: DC

## 2012-03-29 NOTE — Telephone Encounter (Signed)
Called patient back  1. Sent in ranitidine. D/c nexium.  2. Metformin 1000 mg BID.  3. Received tier exception request form for [employer] will fill it out.

## 2012-03-29 NOTE — Telephone Encounter (Signed)
Insurance is calling to discuss the patients request for Premarin.  They need Dx to support and other meds she has tried.

## 2012-03-29 NOTE — Telephone Encounter (Signed)
Called back and spoke to Loel Dubonnet RN with [employer] Premarin approved as tier 3. Good for one year They will call patient and send approval letter.

## 2012-03-29 NOTE — Assessment & Plan Note (Signed)
Treat vaginal atrophy with premarin to heal mucosa and prevent recurrent UTIs.

## 2012-03-29 NOTE — Progress Notes (Addendum)
Subjective:     Patient ID: Jessica Hart, female   DOB: 1940/08/21, 72 y.o.   MRN: [id number]  Previous PCP: Dr. Delcie Roch [place], [place] HPI 72 yo F presents for f/u visit to discuss the following:   1. Dysuria: x 2 weeks. Pain at the end of her stream. Also with frequency. Denies N/V and fever. Admits to vaginal dryness. Has not been sexually active with her husband for a long time.   2. Diabetes: compliant with metformin. Not compliant with glipizide. Not exercising.   3. HLD: taking statin. Denies MSK pain.   4. HTN: BP at goal for age. Compliant with mediations. Denies dizziness upon standing, CP, SOB and LE edema.   Review of Systems As per HPI     Objective:   Physical Exam BP 148/63  Pulse 75  Temp 98.3 F (36.8 C) (Oral)  Ht _0  (1.626 m)  Wt 186 lb 14.4 oz (84.777 kg)  BMI 32.08 kg/m2 General appearance: alert, cooperative, no distress and mildly obese Neck: no adenopathy, no carotid bruit, no JVD, supple, symmetrical, trachea midline and thyroid not enlarged, symmetric, no tenderness/mass/nodules Breasts: normal appearance, no masses or tenderness Back: symmetric, no curvature. ROM normal. No CVA tenderness. Lungs: clear to auscultation bilaterally Heart: regular rate and rhythm, S1, S2 normal, no murmur, click, rub or gallop Abdomen: soft, non-tender; bowel sounds normal; no masses,  no organomegaly Pelvic: external genitalia with periurethral excoriations. Pelvic floor intact without prolapse.  Extremities: extremities normal, atraumatic, no cyanosis or edema Neurologic: Grossly normal     Assessment and Plan:

## 2012-03-29 NOTE — Assessment & Plan Note (Signed)
Restart zantac.

## 2012-03-29 NOTE — Assessment & Plan Note (Signed)
A: WBC on UA and symptomatic P: Treat UTI with keflex Treat vaginal atrophy with premarin to heal mucosa and prevent recurrent UTIs.

## 2012-03-29 NOTE — Telephone Encounter (Signed)
They will probably have other questions that I will not be able to answer.  Will forward to MD. Clinton Sawyer, Salome Spotted

## 2012-03-29 NOTE — Assessment & Plan Note (Signed)
A: declined.  P:  continue glipizide Increase metformin to 1000 mg BID Diabetic diet and increase exercise per AVS.  F/u in 3 months.

## 2012-04-26 ENCOUNTER — Other Ambulatory Visit: Payer: Self-pay | Admitting: *Deleted

## 2012-04-26 DIAGNOSIS — I10 Essential (primary) hypertension: Secondary | ICD-10-CM

## 2012-04-26 MED ORDER — AMLODIPINE BESYLATE 10 MG PO TABS: 10.0000 mg | ORAL_TABLET | Freq: Every day | ORAL | Status: DC

## 2012-05-09 ENCOUNTER — Other Ambulatory Visit: Payer: Self-pay | Admitting: *Deleted

## 2012-05-10 MED ORDER — METOPROLOL TARTRATE 100 MG PO TABS: 100.0000 mg | ORAL_TABLET | Freq: Two times a day (BID) | ORAL | Status: DC

## 2012-05-10 MED ORDER — LOSARTAN POTASSIUM 25 MG PO TABS: 25.0000 mg | ORAL_TABLET | Freq: Every day | ORAL | Status: DC

## 2012-05-21 ENCOUNTER — Emergency Department (HOSPITAL_COMMUNITY)
Admission: EM | Admit: 2012-05-21 | Discharge: 2012-05-21 | Disposition: A | Discharge status: Home / Self Care | Payer: Medicare Other | Attending: Emergency Medicine | Admitting: Emergency Medicine

## 2012-05-21 ENCOUNTER — Encounter (HOSPITAL_COMMUNITY): Payer: Self-pay

## 2012-05-21 DIAGNOSIS — Z79899 Other long term (current) drug therapy: Secondary | ICD-10-CM | POA: Insufficient documentation

## 2012-05-21 DIAGNOSIS — E119 Type 2 diabetes mellitus without complications: Secondary | ICD-10-CM | POA: Insufficient documentation

## 2012-05-21 DIAGNOSIS — R059 Cough, unspecified: Secondary | ICD-10-CM | POA: Insufficient documentation

## 2012-05-21 DIAGNOSIS — R040 Epistaxis: Secondary | ICD-10-CM

## 2012-05-21 DIAGNOSIS — Z7982 Long term (current) use of aspirin: Secondary | ICD-10-CM | POA: Insufficient documentation

## 2012-05-21 DIAGNOSIS — E785 Hyperlipidemia, unspecified: Secondary | ICD-10-CM | POA: Insufficient documentation

## 2012-05-21 DIAGNOSIS — R05 Cough: Secondary | ICD-10-CM | POA: Insufficient documentation

## 2012-05-21 DIAGNOSIS — Z87891 Personal history of nicotine dependence: Secondary | ICD-10-CM | POA: Insufficient documentation

## 2012-05-21 DIAGNOSIS — Z8679 Personal history of other diseases of the circulatory system: Secondary | ICD-10-CM | POA: Insufficient documentation

## 2012-05-21 DIAGNOSIS — J3489 Other specified disorders of nose and nasal sinuses: Secondary | ICD-10-CM | POA: Insufficient documentation

## 2012-05-21 NOTE — ED Notes (Signed)
Patient presents with epistaxis. Had one episode earlier today that spontaneously stopped and 2nd episode that started about 0805 this evening. Patient was here visiting a family member when this began. Patient has a URI for the past 3 days in which she has been taking OTC medications for her sx. Patient reports have nasal dryness for the past week. Bleeding stopped at triage with constant pressure applied by patient. Patient is NOT on any anticoagulants.

## 2012-05-21 NOTE — ED Notes (Signed)
Discussed followup care with Dr. Adrian Blackwater.

## 2012-05-21 NOTE — ED Notes (Addendum)
Pt states she's never had this happen before.  She has been under a tremendous amount of stress lately.  The first nose bleed was about 1230 with the bleeding stopping spontenously and this latest episode started about 2030 and the bleeding is not well under controlled.  Denies any injuries.  Pt is alert and oriented, showing no signs of distress.

## 2012-05-21 NOTE — ED Provider Notes (Signed)
History    This chart was scribed for non-physician practitioner, Clayton Bibles working with Dot Lanes, MD by Rhae Lerner, ED scribe. This patient was seen in room TR08C/TR08C and the patient's care was started at 9:55 PM.   CSN: [id number]  Arrival date & time 05/21/12  2008       Chief Complaint  Patient presents with  . Epistaxis     The history is provided by the patient. No language interpreter was used.   Jessica Hart is a 72 y.o. female who presents to the Emergency Department complaining of epistaxis onset today. Pt reports that she had 2 episodes of nose bleeding today. She reports the first one occurred constantly for 20 minutes from left nares then subsided after applying pressure. The second nose from left nares bleed lasted longer. She reports that she takes 81 mg asa daily. She denies taking coumadin. She reports having nasal congestion, sneezing, rhinorrhea and cough onset 3 days ago. She states she used saline after 1st nosebleed. Pt denies hematuria, tarry stools, hemoptysis, fever, chills, nausea, vomiting, diarrhea, weakness, SOB and any other pain.   Past Medical History  Diagnosis Date  . Echocardiogram findings abnormal, without diagnosis 2005    EF 47%, no ischemia, no infarction  . History of bone density study 2006    Lt hip = -1.0, L spine = -1.8   . Diabetes mellitus 02/27/2005  . Hyperlipidemia     Past Surgical History  Procedure Laterality Date  . Abdominal hysterectomy  1989    one ovary remains per patient  . Tonsilectomy, adenoidectomy, bilateral myringotomy and tubes  1965    Family History  Problem Relation Age of Onset  . Heart disease Father   . Cancer Sister     History  Substance Use Topics  . Smoking status: Former Smoker    Quit date: 07/27/2005  . Smokeless tobacco: Former Systems developer    Quit date: 04/06/2005  . Alcohol Use: No    OB History   Grav Para Term Preterm Abortions TAB SAB Ect Mult Living                   Review of Systems  Constitutional: Negative for fever and chills.  HENT: Positive for congestion, rhinorrhea and sneezing.   Respiratory: Positive for cough. Negative for shortness of breath.   Gastrointestinal: Negative for nausea, vomiting and diarrhea.  Neurological: Negative for weakness.    Allergies  Lisinopril  Home Medications   Current Outpatient Rx  Name  Route  Sig  Dispense  Refill  . acetaminophen (TYLENOL) 500 MG tablet   Oral   Take 1,000 mg by mouth every 6 (six) hours as needed for pain.         Marland Kitchen amLODipine (NORVASC) 10 MG tablet   Oral   Take 1 tablet (10 mg total) by mouth daily.   30 tablet   11   . aspirin 81 MG EC tablet   Oral   Take 81 mg by mouth every morning.          . Calcium-Vitamin D 600-200 MG-UNIT per tablet   Oral   Take 2 tablets by mouth daily.           Marland Kitchen conjugated estrogens (PREMARIN) vaginal cream   Vaginal   Place vaginally daily.   42.5 g   12   . fish oil-omega-3 fatty acids 1000 MG capsule   Oral   Take 1 g by mouth  3 (three) times daily.           Marland Kitchen glipiZIDE (GLUCOTROL) 5 MG tablet   Oral   Take 1 tablet (5 mg total) by mouth 2 (two) times daily before a meal.   180 tablet   3   . losartan (COZAAR) 25 MG tablet   Oral   Take 1 tablet (25 mg total) by mouth daily.   30 tablet   11   . metFORMIN (GLUCOPHAGE) 1000 MG tablet   Oral   Take 1 tablet (1,000 mg total) by mouth 2 (two) times daily with a meal.   90 tablet   3   . metoprolol (LOPRESSOR) 100 MG tablet   Oral   Take 1 tablet (100 mg total) by mouth 2 (two) times daily.   60 tablet   11   . ranitidine (ZANTAC) 150 MG tablet   Oral   Take 1 tablet (150 mg total) by mouth at bedtime.   90 tablet   0     BP 144/80  Pulse 91  Temp(Src) 98.2 F (36.8 C) (Oral)  Resp 15  SpO2 97%  Physical Exam  Nursing note and vitals reviewed. Constitutional: She appears well-developed and well-nourished. No distress.  HENT:  Head:  Normocephalic and atraumatic.  Mouth/Throat: Oropharynx is clear and moist. No oropharyngeal exudate.  Right nare is nl without blood Left nare has minimal dry blood mucosa of left nare is nl  Frontal and maxillary sinuses are non tender   Eyes: Conjunctivae are normal.  Neck: Neck supple.  Cardiovascular: Normal rate and regular rhythm.   Pulmonary/Chest: Effort normal and breath sounds normal. No respiratory distress. She has no wheezes. She has no rales.  Lymphadenopathy:    She has no cervical adenopathy.  Neurological: She is alert.  Skin: She is not diaphoretic.    ED Course  Procedures (including critical care time) DIAGNOSTIC STUDIES: Oxygen Saturation is 97% on room air, normal by my interpretation.    COORDINATION OF CARE: 10:00 PM Discussed ED treatment with pt and pt agrees.     Labs Reviewed - No data to display No results found.   1. Epistaxis       MDM  Epistaxis x 2 today that resolved with applied pressure.  Pt on baby aspirin, no other anticoagulants.  No other sources of bleeding.  Pt with mild respiratory symptoms and c/o dry nasal mucosa earlier today.  No vessel seen needing cautery at this time.  Discussed diagnosis, care plan with patient.  Pt given return precautions.  Pt verbalizes understanding and agrees with plan.     I personally performed the services described in this documentation, which was scribed in my presence. The recorded information has been reviewed and is accurate.       Clayton Bibles, PA-C 05/21/12 2224

## 2012-05-22 NOTE — ED Provider Notes (Signed)
Medical screening examination/treatment/procedure(s) were performed by non-physician practitioner and as supervising physician I was immediately available for consultation/collaboration.   Dot Lanes, MD 05/22/12 2238

## 2012-06-10 ENCOUNTER — Telehealth: Payer: Self-pay | Admitting: Family Medicine

## 2012-06-10 DIAGNOSIS — I10 Essential (primary) hypertension: Secondary | ICD-10-CM

## 2012-06-10 NOTE — Telephone Encounter (Signed)
Patient is calling because her pharmacy will not fill her Rx's for Pravastatin or Losartan because they still have Dr. Oneita Jolly name on them and she doesn't understand why that is.  She uses [facility] [address].

## 2012-06-11 MED ORDER — PRAVASTATIN SODIUM 40 MG PO TABS: 40.0000 mg | ORAL_TABLET | Freq: Every day | ORAL | Status: DC

## 2012-06-11 MED ORDER — AMLODIPINE BESYLATE 10 MG PO TABS: 10.0000 mg | ORAL_TABLET | Freq: Every day | ORAL | Status: DC

## 2012-06-11 MED ORDER — LOSARTAN POTASSIUM 25 MG PO TABS: 25.0000 mg | ORAL_TABLET | Freq: Every day | ORAL | Status: DC

## 2012-06-11 NOTE — Telephone Encounter (Signed)
meds sent in. Please inform patient.

## 2012-07-12 ENCOUNTER — Other Ambulatory Visit: Payer: Self-pay | Admitting: Family Medicine

## 2012-09-05 ENCOUNTER — Other Ambulatory Visit: Payer: Self-pay

## 2012-09-25 ENCOUNTER — Encounter: Payer: Self-pay | Admitting: Family Medicine

## 2012-09-25 ENCOUNTER — Ambulatory Visit (INDEPENDENT_AMBULATORY_CARE_PROVIDER_SITE_OTHER): Payer: Medicare Other | Admitting: Family Medicine

## 2012-09-25 VITALS — BP 153/62 | HR 69 | Temp 98.0°F | Wt 181.0 lb

## 2012-09-25 DIAGNOSIS — M1710 Unilateral primary osteoarthritis, unspecified knee: Secondary | ICD-10-CM

## 2012-09-25 DIAGNOSIS — M1990 Unspecified osteoarthritis, unspecified site: Secondary | ICD-10-CM

## 2012-09-25 DIAGNOSIS — IMO0002 Reserved for concepts with insufficient information to code with codable children: Secondary | ICD-10-CM

## 2012-09-25 DIAGNOSIS — E119 Type 2 diabetes mellitus without complications: Secondary | ICD-10-CM

## 2012-09-25 LAB — POCT GLYCOSYLATED HEMOGLOBIN (HGB A1C): Hemoglobin A1C: 6.4

## 2012-09-25 MED ORDER — EAR WAX DROPS 6.5 % OT SOLN: 3.0000 [drp] | OTIC | Status: DC | PRN

## 2012-09-25 MED ORDER — GLUCOSE BLOOD VI STRP: ORAL_STRIP | Status: DC

## 2012-09-25 MED ORDER — METFORMIN HCL 1000 MG PO TABS: 500.0000 mg | ORAL_TABLET | Freq: Two times a day (BID) | ORAL | Status: DC

## 2012-09-25 MED ORDER — NAPROXEN 250 MG PO TABS: 250.0000 mg | ORAL_TABLET | Freq: Two times a day (BID) | ORAL | Status: DC

## 2012-09-25 MED ORDER — ONETOUCH ULTRASOFT LANCETS MISC: Status: DC

## 2012-09-25 NOTE — Patient Instructions (Addendum)
Jessica Hart it was lovely to meet you Please go to the radiology department at [facility] for evaluation of your knee pain Please start taking aleve OTC strength for your knee pain Great job with your diabetes, Keep a journal for one week of your sugars Cut your dose of metformin in half to 563m X2 per day

## 2012-09-25 NOTE — Progress Notes (Signed)
Patient ID: Jessica Hart, female   DOB: 1940/10/03, 72 y.o.   MRN: [id number] [facility] Bernadene Bell, MD Phone: [phone]  Subjective:  #knee pain -has taken ibuprofen 2*520m in the am and 2*5062min the evening, has been having nagging pain on the left knee. No trauma or incident. Has arthritis in multiple joints for which she takes ibuprofen. Took aleve one day for knee pain and seem to be much improved. Has not tried other therapies  # physical  -is in routine health no other interval complaints   #DM DIABETES Disease Monitoring: does not consistently monitor sugars because does not have meter at home at this point Blood Sugar ranges- unsure? Polyuria/phagia/dipsia- watery stools after eating watermelon, also noticed the same thing if she over eats other foods, just noticed this summer      Visual problems- none Medications: metformin and glipizide  Compliance- takes consistently every day Hypoglycemic symptoms- feels faint when hasnt eaten for a while  ROS--See HPI  Past Medical History Patient Active Problem List   Diagnosis Date Noted  . UTI (lower urinary tract infection) 03/27/2012  . 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 GERD (gastroesophageal reflux disease) 09/05/2011  . Atherosclerosis of other specified arteries 12/28/2009  . DIABETES MELLITUS II, UNCOMPLICATED 0211/55/2080. HYPERTRIGLYCERIDEMIA 04/26/2006  . HYPERLIPIDEMIA 04/26/2006  . OBESITY, NOS 04/26/2006  . HYPERTENSION, BENIGN SYSTEMIC 04/26/2006  . ALOPECIA NOS, BALDNESS 04/26/2006  . OSTEOARTHRITIS, MULTI SITES 04/26/2006  . OSTEOPENIA 04/26/2006   Reviewed problem list.  Medications- reviewed and updated Chief complaint-noted  Objective: BP 153/62  Pulse 69  Temp(Src) 98 F (36.7 C) (Oral)  Wt 181 lb (82.101 kg)  BMI 31.05 kg/m2 Gen: NAD, alert, cooperative with exam HEENT: NCAT, EOMI, PERRL, TMs both with ear wax, L>R cerumen impaction Neck: FROM, supple CV: RRR,  good S1/S2, no murmur, cap refill <3 Resp: CTABL, no wheezes, non-labored Abd: obese, SNTND, BS present, no guarding or organomegaly Ext: No edema, warm, normal tone, moves UE/LE spontaneously, medial joint line tenderness L knee.  Neuro: Alert and oriented, No gross deficits Skin: no rashes no lesions Diabetic foot: calluses noted on bottom of left foot, no sensory deficits, in tact to light touch, proprioception, strong DP  Assessment/Plan: See problem based A/P  Ear wax: cerumen impaction- have recommended debrox ear drops OTC

## 2012-09-25 NOTE — Assessment & Plan Note (Signed)
A: improved! ha1c 6.4 here, reports medication compliance. Has noted some intolerances to food P: cut metformin to 500BID, continue glipizide, diabetic diet and exercise

## 2012-09-25 NOTE — Assessment & Plan Note (Addendum)
A: patient with continued discomfort in l knee>r knee. 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

## 2012-10-10 ENCOUNTER — Other Ambulatory Visit: Payer: Self-pay | Admitting: *Deleted

## 2012-10-10 MED ORDER — RANITIDINE HCL 150 MG PO TABS: ORAL_TABLET | ORAL | Status: DC

## 2012-10-29 ENCOUNTER — Encounter: Payer: Self-pay | Admitting: Home Health Services

## 2012-10-29 ENCOUNTER — Ambulatory Visit (INDEPENDENT_AMBULATORY_CARE_PROVIDER_SITE_OTHER): Payer: Medicare Other | Admitting: Home Health Services

## 2012-10-29 VITALS — BP 162/80 | HR 67 | Temp 97.0°F | Ht 64.0 in | Wt 183.6 lb

## 2012-10-29 DIAGNOSIS — Z0000 Encounter for general adult medical examination without abnormal findings: Secondary | ICD-10-CM

## 2012-10-29 DIAGNOSIS — Z23 Encounter for immunization: Secondary | ICD-10-CM

## 2012-10-29 NOTE — Patient Instructions (Signed)
Patient here for annual wellness visit, patient reports: Risk Factors/Conditions needing evaluation or treatment: Pt does not have any new risk factors that need evaluation. Home Safety: Pt lives with husband in 1 story home. Pt reports having smoke detectors. Other Information: Corrective lens: Pt wears daily corrective lens, reports having annual eye exams (verified). Dentures: Pt has upper plate. Memory: Pt reports some memory problems. Patient's Mini Mental Score (recorded in doc. flowsheet): 30  Balance/Gait: Pt does not have any noticeable impairment.  Balance Abnormal Patient value  Sitting balance    Sit to stand    Attempts to arise    Immediate standing balance    Standing balance    Nudge    Eyes closed- Romberg    Tandem stance    Back lean    Neck Rotation    360 degree turn    Sitting down     Gait Abnormal Patient value  Initiation of gait    Step length-left    Step length-right    Step height-left    Step height-right    Step symmetry    Step continuity    Path deviation    Trunk movement    Walking stance        Annual Wellness Visit Requirements Recorded Today In  Medical, family, social history Past Medical, Family, Social History Section  Current providers Care team  Current medications Medications  Wt, BP, Ht, BMI Vital signs  Hearing assessment (welcome visit) Hearing/vision  Tobacco, alcohol, illicit drug use History  ADL Nurse Assessment  Depression Screening Nurse Assessment  Cognitive impairment Nurse Assessment  Mini Mental Status Document Flowsheet  Fall Risk Fall/Depression  Home Safety Progress Note  End of Life Planning (welcome visit) Social Documentation  Medicare preventative services Progress Note  Risk factors/conditions needing evaluation/treatment Progress Note  Personalized health advice Patient Instructions, goals, letter  Diet & Exercise Social Documentation  Emergency Contact Social Documentation  Seat Belts Social  Documentation  Sun exposure/protection Social Documentation

## 2012-10-29 NOTE — Progress Notes (Signed)
Patient here for annual wellness visit, patient reports: Risk Factors/Conditions needing evaluation or treatment: Pt does not have any new risk factors that need evalution. Home Safety: Pt lives with husband in 1 story home.  Pt reports having smoke detectors. Other Information: Corrective lens: Pt wears daily corrective lens, reports annual eye exams (verfied). Dentures: Pt has upper plate.  1 Memory: Pt reports some memory problems. Patient's Mini Mental Score (recorded in doc. flowsheet): 30  Balance/Gait: Pt does not have any noticeable impairment.  Balance Abnormal Patient value  Sitting balance    Sit to stand    Attempts to arise    Immediate standing balance    Standing balance    Nudge    Eyes closed- Romberg    Tandem stance    Back lean    Neck Rotation    360 degree turn    Sitting down     Gait Abnormal Patient value  Initiation of gait    Step length-left    Step length-right    Step height-left    Step height-right    Step symmetry    Step continuity    Path deviation    Trunk movement    Walking stance        Annual Wellness Visit Requirements Recorded Today In  Medical, family, social history Past Medical, Family, Social History Section  Current providers Care team  Current medications Medications  Wt, BP, Ht, BMI Vital signs  Hearing assessment (welcome visit) Hearing/vision  Tobacco, alcohol, illicit drug use History  ADL Nurse Assessment  Depression Screening Nurse Assessment  Cognitive impairment Nurse Assessment  Mini Mental Status Document Flowsheet  Fall Risk Fall/Depression  Home Safety Progress Note  End of Life Planning (welcome visit) Social Documentation  Medicare preventative services Progress Note  Risk factors/conditions needing evaluation/treatment Progress Note  Personalized health advice Patient Instructions, goals, letter  Diet & Exercise Social Documentation  Emergency Contact Social Documentation  Seat Belts Social  Documentation  Sun exposure/protection Social Documentation

## 2012-10-30 ENCOUNTER — Ambulatory Visit: Payer: Medicare Other | Admitting: Home Health Services

## 2013-01-09 ENCOUNTER — Telehealth: Payer: Self-pay | Admitting: Family Medicine

## 2013-01-09 DIAGNOSIS — M179 Osteoarthritis of knee, unspecified: Secondary | ICD-10-CM

## 2013-01-09 DIAGNOSIS — M1710 Unilateral primary osteoarthritis, unspecified knee: Secondary | ICD-10-CM

## 2013-01-09 MED ORDER — NAPROXEN 250 MG PO TABS: 250.0000 mg | ORAL_TABLET | Freq: Two times a day (BID) | ORAL | Status: DC

## 2013-01-09 MED ORDER — RANITIDINE HCL 150 MG PO TABS: ORAL_TABLET | ORAL | Status: DC

## 2013-01-09 NOTE — Telephone Encounter (Signed)
I have sent in refills for both. However, could we verify if her arthritic pain is any better when taking aleve vs naproxen? I was hopeful that we could taper the NSAID to prevent GI bleeding, esp in pt of this age. I was hoping she could take 1 aleve that would last her for a longer period of time vs severe naproxens a day. I would like to see her in 1-3 months for revisiting chronic pain issues. Thanks! [facility], MD

## 2013-01-09 NOTE — Telephone Encounter (Signed)
Pt called and would like refills on her Naproxen and ranitidine sent to her pharmacy. jw

## 2013-01-10 NOTE — Telephone Encounter (Signed)
LMOVM for pt to return call. Please schedule her an appt in the next month or so to discuss her medication and chronic pain. Aysha Livecchi, Salome Spotted

## 2013-02-05 ENCOUNTER — Encounter: Payer: Self-pay | Admitting: Home Health Services

## 2013-02-14 ENCOUNTER — Ambulatory Visit: Payer: Medicare Other | Admitting: Family Medicine

## 2013-03-10 ENCOUNTER — Ambulatory Visit: Payer: Medicare Other | Admitting: Family Medicine

## 2013-03-21 ENCOUNTER — Ambulatory Visit (INDEPENDENT_AMBULATORY_CARE_PROVIDER_SITE_OTHER): Payer: Medicare HMO | Admitting: Family Medicine

## 2013-03-21 ENCOUNTER — Encounter: Payer: Self-pay | Admitting: Family Medicine

## 2013-03-21 VITALS — BP 144/59 | HR 68 | Temp 98.8°F | Ht 64.0 in | Wt 189.0 lb

## 2013-03-21 DIAGNOSIS — IMO0002 Reserved for concepts with insufficient information to code with codable children: Secondary | ICD-10-CM

## 2013-03-21 DIAGNOSIS — M1710 Unilateral primary osteoarthritis, unspecified knee: Secondary | ICD-10-CM

## 2013-03-21 DIAGNOSIS — E119 Type 2 diabetes mellitus without complications: Secondary | ICD-10-CM

## 2013-03-21 DIAGNOSIS — R32 Unspecified urinary incontinence: Secondary | ICD-10-CM

## 2013-03-21 DIAGNOSIS — L84 Corns and callosities: Secondary | ICD-10-CM

## 2013-03-21 DIAGNOSIS — I10 Essential (primary) hypertension: Secondary | ICD-10-CM

## 2013-03-21 DIAGNOSIS — M179 Osteoarthritis of knee, unspecified: Secondary | ICD-10-CM

## 2013-03-21 DIAGNOSIS — R350 Frequency of micturition: Secondary | ICD-10-CM

## 2013-03-21 LAB — COMPREHENSIVE METABOLIC PANEL
ALK PHOS: 105 U/L (ref 39–117)
ALT: 17 U/L (ref 0–35)
AST: 18 U/L (ref 0–37)
Albumin: 4.6 g/dL (ref 3.5–5.2)
BUN: 11 mg/dL (ref 6–23)
CO2: 25 mEq/L (ref 19–32)
CREATININE: 0.76 mg/dL (ref 0.50–1.10)
Calcium: 9.7 mg/dL (ref 8.4–10.5)
Chloride: 106 mEq/L (ref 96–112)
Glucose, Bld: 116 mg/dL — ABNORMAL HIGH (ref 70–99)
Potassium: 3.6 mEq/L (ref 3.5–5.3)
Sodium: 139 mEq/L (ref 135–145)
Total Bilirubin: 0.3 mg/dL (ref 0.3–1.2)
Total Protein: 7.7 g/dL (ref 6.0–8.3)

## 2013-03-21 LAB — POCT URINALYSIS DIPSTICK
Bilirubin, UA: NEGATIVE
Glucose, UA: NEGATIVE
Ketones, UA: NEGATIVE
Nitrite, UA: NEGATIVE
Protein, UA: NEGATIVE
SPEC GRAV UA: 1.01
Urobilinogen, UA: 0.2
pH, UA: 6

## 2013-03-21 LAB — LDL CHOLESTEROL, DIRECT: Direct LDL: 81 mg/dL

## 2013-03-21 LAB — POCT UA - MICROSCOPIC ONLY

## 2013-03-21 LAB — POCT GLYCOSYLATED HEMOGLOBIN (HGB A1C): Hemoglobin A1C: 6.4

## 2013-03-21 MED ORDER — NAPROXEN 250 MG PO TABS: 250.0000 mg | ORAL_TABLET | Freq: Two times a day (BID) | ORAL | Status: DC

## 2013-03-21 NOTE — Patient Instructions (Signed)
Jessica Hart,  It was good to see you today! I am sorry to hear that things have been stressful for you lately.  I would like to focus on your blood pressure for next visit with a goal of 130/80. Please try to visit a store such as [employer] to measure your blood pressure at least every 2 weeks.  I would also like you to try to incorporate some sort of physical activity at least 3 times per week. Below are some ideas about the DASH diet. I will see you back in 3 months. I will let you know the results of your labs as soon they are processed For now please stop using treatments on your foot and see if the pain goes away. If it continues or you are having trouble feeling things on the bottom of your foot please call our office right away. Feel better Jessica Bell, MD DASH Diet The DASH diet stands for "Dietary Approaches to Stop Hypertension." It is a healthy eating plan that has been shown to reduce high blood pressure (hypertension) in as little as 14 days, while also possibly providing other significant health benefits. These other health benefits include reducing the risk of breast cancer after menopause and reducing the risk of type 2 diabetes, heart disease, colon cancer, and stroke. Health benefits also include weight loss and slowing kidney failure in patients with chronic kidney disease.  DIET GUIDELINES  Limit salt (sodium). Your diet should contain less than 1500 mg of sodium daily.  Limit refined or processed carbohydrates. Your diet should include mostly whole grains. Desserts and added sugars should be used sparingly.  Include small amounts of heart-healthy fats. These types of fats include nuts, oils, and tub margarine. Limit saturated and trans fats. These fats have been shown to be harmful in the body. CHOOSING FOODS  The following food groups are based on a 2000 calorie diet. See your Registered Dietitian for individual calorie needs. Grains and Grain Products (6 to 8  servings daily)  Eat More Often: Whole-wheat bread, brown rice, whole-grain or wheat pasta, quinoa, popcorn without added fat or salt (air popped).  Eat Less Often: White bread, white pasta, white rice, cornbread. Vegetables (4 to 5 servings daily)  Eat More Often: Fresh, frozen, and canned vegetables. Vegetables may be raw, steamed, roasted, or grilled with a minimal amount of fat.  Eat Less Often/Avoid: Creamed or fried vegetables. Vegetables in a cheese sauce. Fruit (4 to 5 servings daily)  Eat More Often: All fresh, canned (in natural juice), or frozen fruits. Dried fruits without added sugar. One hundred percent fruit juice ( cup [237 mL] daily).  Eat Less Often: Dried fruits with added sugar. Canned fruit in light or heavy syrup. [employer], Fish, and Poultry (2 servings or less daily. One serving is 3 to 4 oz [85-114 g]).  Eat More Often: Ninety percent or leaner ground beef, tenderloin, sirloin. Round cuts of beef, chicken breast, [place] breast. All fish. Grill, bake, or broil your meat. Nothing should be fried.  Eat Less Often/Avoid: Fatty cuts of meat, [place], or chicken leg, thigh, or wing. Fried cuts of meat or fish. Dairy (2 to 3 servings)  Eat More Often: Low-fat or fat-free milk, low-fat plain or light yogurt, reduced-fat or part-skim cheese.  Eat Less Often/Avoid: Milk (whole, 2%).Whole milk yogurt. Full-fat cheeses. Nuts, Seeds, and Legumes (4 to 5 servings per week)  Eat More Often: All without added salt.  Eat Less Often/Avoid: Salted nuts and  seeds, canned beans with added salt. Fats and Sweets (limited)  Eat More Often: Vegetable oils, tub margarines without trans fats, sugar-free gelatin. Mayonnaise and salad dressings.  Eat Less Often/Avoid: Coconut oils, palm oils, butter, stick margarine, cream, half and half, cookies, candy, pie. FOR MORE INFORMATION The Dash Diet Eating Plan: www.dashdiet.org Document Released: 02/02/2011 Document Revised: 05/08/2011  Document Reviewed: 02/02/2011 [facility] Patient Information 2014 [place], [place].

## 2013-03-21 NOTE — Progress Notes (Signed)
Patient ID: Jessica Hart, female   DOB: 03-17-1940, 73 y.o.   MRN: [id number] [facility] Bernadene Bell, MD Phone: [phone]  Subjective:  Pt is a 72y.o F with extensive PMH here for f/up and 2 acute issues  # HTN -reports compliance with metoprolol, losartan and amlodipine but admits that she has not been dieting or exercising well. No side effects from medications. Takes daily per report. Denies sudden vision changes (but has had chronic decline with aging), headaches, chest pain or palps; does occasionally have peripheral swelling  # pain in L foot -has chronic callus on bottom of left foot, has been using Dr. Zoe Lan and filing down; now is actually at the flattest it has ever been, just stopped using aggressive measures -notices she has had some pain with walking on bottom of foot described as sensitive, pinprick areas, normal sensation no traumas or punctures    #urinary incontinence -has a hx of bedtime wettings/night time wettings but worse in the past month, describes frequency (urniates approx 4-5 times a day) and some burning with urination accompanied by foul smell but otherwise no urgency  -no recent changes in laundry detergent or soaps  All systems were reviewed and were negative unless otherwise noted in the HPI  Past Medical History Patient Active Problem List   Diagnosis Date Noted  . UTI (lower urinary tract infection) 03/27/2012  . 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 GERD (gastroesophageal reflux disease) 09/05/2011  . Atherosclerosis of other specified arteries 12/28/2009  . DIABETES MELLITUS II, UNCOMPLICATED 09/64/3838  . HYPERTRIGLYCERIDEMIA 04/26/2006  . HYPERLIPIDEMIA 04/26/2006  . OBESITY, NOS 04/26/2006  . HYPERTENSION, BENIGN SYSTEMIC 04/26/2006  . ALOPECIA NOS, BALDNESS 04/26/2006  . OSTEOARTHRITIS, MULTI SITES 04/26/2006  . OSTEOPENIA 04/26/2006   Reviewed problem list.  Medications- reviewed and updated Chief  complaint-noted No additions to family history Social history- patient is a previous smoker  Objective: BP 144/59  Pulse 68  Temp(Src) 98.8 F (37.1 C) (Oral)  Ht _0  (1.626 m)  Wt 189 lb (85.73 kg)  BMI 32.43 kg/m2 Gen: NAD, alert, cooperative with exam HEENT: NCAT, EOMI, PERRL Neck: FROM, supple CV: RRR, good S1/S2, no murmur, cap refill <3 Resp: CTABL, no wheezes, non-labored Abd: SNTND, BS present, no guarding or organomegaly Ext: Mild periph edema, warm, normal tone, moves UE/LE spontaneously; bottom of foot with nickel sized area of previous callus, some post traumatic subq blood, no puncture sites Neuro: Alert and oriented, No gross deficits. Sensation intact to light touch, no deficits to proprioception Skin: no rashes no lesions  Assessment/Plan: See problem based a/p

## 2013-03-22 DIAGNOSIS — L84 Corns and callosities: Secondary | ICD-10-CM | POA: Insufficient documentation

## 2013-03-22 DIAGNOSIS — R32 Unspecified urinary incontinence: Secondary | ICD-10-CM | POA: Insufficient documentation

## 2013-03-22 LAB — MICROALBUMIN / CREATININE URINE RATIO
CREATININE, URINE: 82.2 mg/dL
Microalb Creat Ratio: 20.8 mg/g (ref 0.0–30.0)
Microalb, Ur: 1.71 mg/dL (ref 0.00–1.89)

## 2013-03-22 NOTE — Assessment & Plan Note (Signed)
A: continues to not be at goal but patient admits to exercise and dietary indiscretions over the holidays P: working on increasing physical activity to at least 2 times per week for 59mn each; repeating CMET and microalbumin for yearly testing; given information for the DASH diet

## 2013-03-22 NOTE — Assessment & Plan Note (Signed)
A: s/p removal at home with Dr. Zoe Lan, likely causing pain from irritation, subq blood likely from irritation; no signs of infection P: instructed to leave alone now that there is no longer any build up there; expect to improve in the next few days without irritative agents -rtc in 1-2 weeks if not improved

## 2013-03-22 NOTE — Assessment & Plan Note (Signed)
A: first mention of incontinence per chart review but according to patient she has had this for a while; recent episode exacerbated in last month concerning for infectious uti vs polydipsia vs stress incontinence. No urgency, happens mostly in morning upon wakening. U/a without leuks or nitrites. Glucose elevated but not >200 on random  P: reflex micro urine, f/up for infectious etiology -consider referral to gyne and possibly urology for urodynamic testing

## 2013-03-25 ENCOUNTER — Telehealth: Payer: Self-pay | Admitting: Family Medicine

## 2013-03-25 DIAGNOSIS — R32 Unspecified urinary incontinence: Secondary | ICD-10-CM

## 2013-03-25 NOTE — Telephone Encounter (Signed)
Updated pt on results of tests. Will place referral to urology regarding morning bed wetting and hx of dribbling.  [facility], MD

## 2013-04-07 ENCOUNTER — Other Ambulatory Visit: Payer: Commercial Managed Care - HMO

## 2013-05-09 ENCOUNTER — Other Ambulatory Visit: Payer: Self-pay | Admitting: Family Medicine

## 2013-06-13 ENCOUNTER — Other Ambulatory Visit: Payer: Self-pay | Admitting: *Deleted

## 2013-06-14 MED ORDER — METOPROLOL TARTRATE 100 MG PO TABS: 100.0000 mg | ORAL_TABLET | Freq: Two times a day (BID) | ORAL | Status: DC

## 2013-06-25 ENCOUNTER — Ambulatory Visit (INDEPENDENT_AMBULATORY_CARE_PROVIDER_SITE_OTHER): Payer: Medicare HMO | Admitting: Family Medicine

## 2013-06-25 ENCOUNTER — Encounter: Payer: Self-pay | Admitting: Family Medicine

## 2013-06-25 VITALS — BP 154/85 | HR 70 | Temp 98.0°F | Wt 184.0 lb

## 2013-06-25 DIAGNOSIS — M179 Osteoarthritis of knee, unspecified: Secondary | ICD-10-CM

## 2013-06-25 DIAGNOSIS — IMO0002 Reserved for concepts with insufficient information to code with codable children: Secondary | ICD-10-CM

## 2013-06-25 DIAGNOSIS — I10 Essential (primary) hypertension: Secondary | ICD-10-CM

## 2013-06-25 DIAGNOSIS — M1990 Unspecified osteoarthritis, unspecified site: Secondary | ICD-10-CM

## 2013-06-25 DIAGNOSIS — M1710 Unilateral primary osteoarthritis, unspecified knee: Secondary | ICD-10-CM

## 2013-06-25 MED ORDER — KETOROLAC TROMETHAMINE 60 MG/2ML IM SOLN
60.0000 mg | Freq: Once | INTRAMUSCULAR | Status: AC
  Administered 2013-06-25: 60 mg via INTRAMUSCULAR

## 2013-06-25 MED ORDER — AMLODIPINE BESYLATE 10 MG PO TABS: 10.0000 mg | ORAL_TABLET | Freq: Every day | ORAL | Status: DC

## 2013-06-25 MED ORDER — METFORMIN HCL 1000 MG PO TABS: ORAL_TABLET | ORAL | Status: DC

## 2013-06-25 MED ORDER — NAPROXEN 500 MG PO TABS: 500.0000 mg | ORAL_TABLET | Freq: Two times a day (BID) | ORAL | Status: DC | PRN

## 2013-06-25 MED ORDER — LOSARTAN POTASSIUM 25 MG PO TABS: 25.0000 mg | ORAL_TABLET | Freq: Every day | ORAL | Status: DC

## 2013-06-25 NOTE — Patient Instructions (Addendum)
[name],  Thank you for coming in today. It was lovely to see you again.   For L knee pain:  1. Shot of toradol today. 2. Please go for x-rays of knees at [facility] at your earliest convenience. 3. Tylenol 1000 mg every 8 hrs 4. Naproxen 500 mg twice with food for pain that breaks through tylenol.  F/u with Dr. Skeet Simmer in 1-3 weeks for diabetes, HTN and knee pain f/u.  Dr. Adrian Blackwater

## 2013-06-25 NOTE — Assessment & Plan Note (Signed)
A: OA L Knee. P: 1. Shot of toradol today. 2. Please go for x-rays of knees at [facility] at your earliest convenience. 3. Tylenol 1000 mg every 8 hrs 4. Naproxen 500 mg twice with food for pain that breaks through tylenol.  F/u with Dr. Skeet Simmer in 1-3 weeks for diabetes, HTN and knee pain f/u.

## 2013-06-25 NOTE — Progress Notes (Signed)
   Subjective:    Patient ID: Jessica Hart, female    DOB: [birth year], 73 y.o.   MRN: [id number] CC: L knee pain  HPI 73 year old female presents for same-day visit discuss the following:  Left knee pain: patient chronic left knee pain. She failed to get x-rays of both knees because she did not understand instructions about her doctor was set up for x-rays and call her with the appointment. She reports everything pain. The pain is in both knees but mostly the left. Pain is left medial. Pain associated with popping. No locking. No falls. No fever. Patient's taking naproxen 250 mg up to twice daily as needed. She is not taking Tylenol as she said she was instructed not to.  Soc Hx: non smoker  Review of Systems As per HPI    Objective:   Physical Exam BP 154/85  Pulse 70  Temp(Src) 98 F (36.7 C) (Oral)  Wt 184 lb (83.462 kg) General appearance: alert, cooperative and no distress L knee: tape on medial knee, removed, mild swelling, no warmth or redness, crepitus. Full ROM.      Assessment & Plan:

## 2013-06-26 ENCOUNTER — Encounter: Payer: Self-pay | Admitting: *Deleted

## 2013-06-26 ENCOUNTER — Other Ambulatory Visit: Payer: Self-pay | Admitting: Family Medicine

## 2013-06-26 ENCOUNTER — Ambulatory Visit (HOSPITAL_COMMUNITY)
Admission: RE | Admit: 2013-06-26 | Discharge: 2013-06-26 | Disposition: A | Discharge status: Home / Self Care | Payer: Medicare HMO | Source: Ambulatory Visit | Attending: Family Medicine | Admitting: Family Medicine

## 2013-06-26 DIAGNOSIS — M1990 Unspecified osteoarthritis, unspecified site: Secondary | ICD-10-CM

## 2013-06-26 DIAGNOSIS — M25569 Pain in unspecified knee: Secondary | ICD-10-CM | POA: Insufficient documentation

## 2013-06-26 NOTE — Telephone Encounter (Signed)
  This encounter was created in error - please disregard.

## 2013-07-02 ENCOUNTER — Telehealth: Payer: Self-pay | Admitting: Family Medicine

## 2013-07-02 NOTE — Telephone Encounter (Signed)
Pt called and would like the results of her x-rays and what the next steps are. jw

## 2013-07-02 NOTE — Telephone Encounter (Signed)
Please call patient.  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  The next step is to schedule follow up with her primary doctor, Dr. Skeet Simmer for knee injections.  She may also be a candidate for synvisc, if she fails corticosteroid injections, which she can receive at [facility] or ortho, this will require a referral from her PCP.

## 2013-07-02 NOTE — Telephone Encounter (Signed)
LMOVM for pt to return call.  Please inform of the 1st half of the message. Laban Emperor Tristram Milian

## 2013-07-15 ENCOUNTER — Ambulatory Visit: Payer: Medicare HMO | Admitting: Family Medicine

## 2013-07-23 ENCOUNTER — Ambulatory Visit (INDEPENDENT_AMBULATORY_CARE_PROVIDER_SITE_OTHER): Payer: Commercial Managed Care - HMO | Admitting: Family Medicine

## 2013-07-23 ENCOUNTER — Encounter: Payer: Self-pay | Admitting: Family Medicine

## 2013-07-23 VITALS — BP 133/72 | HR 75 | Temp 98.0°F | Ht 64.0 in | Wt 181.0 lb

## 2013-07-23 DIAGNOSIS — M25569 Pain in unspecified knee: Secondary | ICD-10-CM

## 2013-07-23 DIAGNOSIS — M1990 Unspecified osteoarthritis, unspecified site: Secondary | ICD-10-CM

## 2013-07-23 DIAGNOSIS — N952 Postmenopausal atrophic vaginitis: Secondary | ICD-10-CM

## 2013-07-23 MED ORDER — ESTROGENS, CONJUGATED 0.625 MG/GM VA CREA: TOPICAL_CREAM | Freq: Every day | VAGINAL | Status: DC

## 2013-07-23 MED ORDER — METHYLPREDNISOLONE ACETATE 40 MG/ML IJ SUSP
40.0000 mg | Freq: Once | INTRAMUSCULAR | Status: AC
  Administered 2013-07-23: 40 mg via INTRA_ARTICULAR

## 2013-07-23 NOTE — Patient Instructions (Signed)
Ms Cater it was great to see you today!  I am pleased to hear that things are going well for you. Please relax for the rest of the evening.  You can apply ice to the knee for 20 minutes at a time to help with swelling and discomfort Tomorrow you should be able to go about your daily activities as tolerated Please call us if you have any concerns including swelling redness or warmth of the joint or even increased pain  Have a great trip in [place]! Looking forward to seeing you soon Bernadene Bell, MD

## 2013-07-23 NOTE — Assessment & Plan Note (Signed)
A: current sx appear to be recurrence of vag atrophy  Feels similar to patient Unfortunately will not let me examine at this time so am unclear P: re-sent rx for premarin (hopefully pt can afford) -could consider wet prep next visit if not improve -could also consider u/a and culture

## 2013-07-23 NOTE — Progress Notes (Signed)
    Patient ID: Jessica Hart, female   DOB: Apr 25, 1940, 73 y.o.   MRN: [id number] [facility] Bernadene Bell, MD Phone: [phone]  Subjective:  Jessica Hart is a 73 y.o F who is here for f/up post XRay  # Left knee pain -has chronic pain, described as medial, assc sx including popping and clicking.  -currently taking naproxen 27m BID as needed -had xrays done showing mild osteoarthritis medially -here today as she is interested in injection for sx relief -no current fevers, has otherwise been acting like her normal self  #Vaginal itching -described as thin skin, has known vag atrophy -no discharge, dysuria, bleeding -no recent intercourse  All systems were reviewed and were negative unless otherwise noted in the HPI  Past Medical History Patient Active Problem List   Diagnosis Date Noted  . Foot callus 03/22/2013  . Urinary incontinence 03/22/2013  . 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 GERD (gastroesophageal reflux disease) 09/05/2011  . Atherosclerosis of other specified arteries 12/28/2009  . DIABETES MELLITUS II, UNCOMPLICATED 010/93/2355 . HYPERTRIGLYCERIDEMIA 04/26/2006  . HYPERLIPIDEMIA 04/26/2006  . OBESITY, NOS 04/26/2006  . HYPERTENSION, BENIGN SYSTEMIC 04/26/2006  . ALOPECIA NOS, BALDNESS 04/26/2006  . OSTEOARTHRITIS, MULTI SITES 04/26/2006  . OSTEOPENIA 04/26/2006   Reviewed problem list.  Medications- reviewed and updated Chief complaint-noted No additions to family history Social history- patient is a former smoker  Objective: BP 133/72  Pulse 75  Temp(Src) 98 F (36.7 C) (Oral)  Ht _0  (1.626 m)  Wt 181 lb (82.101 kg)  BMI 31.05 kg/m2 Gen: NAD, alert, cooperative with exam GU: not examined, patient deferred at this time Ext: No edema, warm, normal tone, moves UE/LE spontaneously; medial joint line tenderness  Neuro: Alert and oriented, No gross deficits Skin: no rashes no lesions   Procedure Note:Joint  Injection Informed consent obtained  Area prepped with betadine swabs and alcohol swabs. Injection performed using depomedrol 40 and lidocaine 1% without epinephrine.  Patient tolerated the procedure well. Hemostasis achieved immediately.  Site of injection covered with band-aid.  Assessment/Plan: See problem based a/p

## 2013-07-23 NOTE — Assessment & Plan Note (Signed)
A: left knee injection performed (see procedure note) Pt appeared to tolerate quite well P: given instructions/precautions and reasons to call Should cont with tylenol 1051m q8 hrs as needed as well as naproxen 5046mprn for break through Will see back in office in 1-2 months

## 2013-08-22 ENCOUNTER — Other Ambulatory Visit: Payer: Self-pay | Admitting: Family Medicine

## 2013-09-01 ENCOUNTER — Ambulatory Visit (INDEPENDENT_AMBULATORY_CARE_PROVIDER_SITE_OTHER): Payer: Commercial Managed Care - HMO | Admitting: Family Medicine

## 2013-09-01 ENCOUNTER — Encounter: Payer: Self-pay | Admitting: Family Medicine

## 2013-09-01 VITALS — BP 130/76 | HR 71 | Ht 64.0 in | Wt 173.0 lb

## 2013-09-01 DIAGNOSIS — N952 Postmenopausal atrophic vaginitis: Secondary | ICD-10-CM

## 2013-09-01 DIAGNOSIS — N898 Other specified noninflammatory disorders of vagina: Secondary | ICD-10-CM

## 2013-09-01 DIAGNOSIS — L293 Anogenital pruritus, unspecified: Secondary | ICD-10-CM

## 2013-09-01 LAB — POCT WET PREP (WET MOUNT): Clue Cells Wet Prep Whiff POC: NEGATIVE

## 2013-09-01 NOTE — Patient Instructions (Signed)
Ms Sproule,  It was great to see you today! I am sorry to hear that these problems are still going on for you  I will call you with the rest of your results For now please only shower, do not use harsh soaps on your vaginal region, only wear cotton panties with white liners If you pee on yourself change pad immediately Stay dry in the summer time Cont using cream if this helps with itching  Looking forward to seeing you soon Bernadene Bell, MD

## 2013-09-01 NOTE — Progress Notes (Signed)
Patient ID: Jessica Hart, female   DOB: 21-Mar-1940, 73 y.o.   MRN: [id number]   [facility] Bernadene Bell, MD Phone: [phone]  Subjective:  Jessica Hart is a 73 y.o F who presents for f/up on vaginal issues   # vaginal pruritis  -tried premarin cream, and only helped somewhat  -had some sx previously  -feels itching, rubs hard and sees some bleeding on paper towel -feels that something is poking her -does not have an odor  -described as thin skin, has known vag atrophy  -no dysuria, does have hx of incontinence and wears panty liners   -no recent intercourse  All systems were reviewed and were negative unless otherwise noted in the HPI  Past Medical History Patient Active Problem List   Diagnosis Date Noted  . Foot callus 03/22/2013  . Urinary incontinence 03/22/2013  . 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 GERD (gastroesophageal reflux disease) 09/05/2011  . Atherosclerosis of other specified arteries 12/28/2009  . DIABETES MELLITUS II, UNCOMPLICATED 81/44/8185  . HYPERTRIGLYCERIDEMIA 04/26/2006  . HYPERLIPIDEMIA 04/26/2006  . OBESITY, NOS 04/26/2006  . HYPERTENSION, BENIGN SYSTEMIC 04/26/2006  . ALOPECIA NOS, BALDNESS 04/26/2006  . OSTEOARTHRITIS, MULTI SITES 04/26/2006  . OSTEOPENIA 04/26/2006   Reviewed problem list.  Medications- reviewed and updated Chief complaint-noted No additions to family history Social history- patient is a former smoker  Objective: BP 130/76  Pulse 71  Ht _0  (1.626 m)  Wt 173 lb (78.472 kg)  BMI 29.68 kg/m2 Gen: NAD, alert, cooperative with exam GU: 2 sml 1X1 lesions ulceration on labial folds, right unroofed and sent for culture, vaginal atrophy noted, speculum exam with minimal discharge in the vault, no foul odor, swabs taken and sent  Neuro: Alert and oriented, No gross deficits Skin: no rashes no lesions  Assessment/Plan: See problem based a/p

## 2013-09-01 NOTE — Assessment & Plan Note (Signed)
A: current sx appears to be related to vaginal atrophy (known hx, takes hot baths, soaks in sitz) vs HSV (ulcerative lesions seen but no known hx) vs bv/yeast (neg wet prep)  P: unroofed lesions and sent for culture Wet prep here neg instrcuted to avoid sitting in baths as this is very drying to skin -cont premarin as pt able to afford -avoid sitting in wet, uncomfortable underwear -change panty liners as much as possible, particularly in the summertime

## 2013-09-03 ENCOUNTER — Encounter: Payer: Self-pay | Admitting: Family Medicine

## 2013-09-03 LAB — HERPES SIMPLEX VIRUS CULTURE: Organism ID, Bacteria: NOT DETECTED

## 2013-09-16 ENCOUNTER — Telehealth: Payer: Self-pay | Admitting: Family Medicine

## 2013-09-16 NOTE — Telephone Encounter (Signed)
Authorization received from [place]. Jessica Hart #[id number].   [facility] informed and pt informed. Shaughn Thomley, Salome Spotted

## 2013-09-16 NOTE — Telephone Encounter (Signed)
Pt called because she has a eye exam tomorrow at Dr. Payton Emerald office [phone]. She was told she needed a referral from her PCP. So we have to do a silver back or can we call with a NPI . jw

## 2013-09-29 ENCOUNTER — Other Ambulatory Visit: Payer: Self-pay | Admitting: Family Medicine

## 2013-11-05 ENCOUNTER — Other Ambulatory Visit: Payer: Self-pay | Admitting: Family Medicine

## 2013-11-05 NOTE — Telephone Encounter (Signed)
Will refill for this one time; however not excellent for chronic NSAID use given renal, GI and cardiovascular risk Advise reassessment shortly and BMET [facility], MD

## 2013-12-23 ENCOUNTER — Other Ambulatory Visit: Payer: Self-pay | Admitting: Family Medicine

## 2013-12-23 MED ORDER — NAPROXEN 500 MG PO TABS: ORAL_TABLET | ORAL | Status: DC

## 2013-12-23 MED ORDER — LOSARTAN POTASSIUM 25 MG PO TABS: 25.0000 mg | ORAL_TABLET | Freq: Every day | ORAL | Status: DC

## 2013-12-23 NOTE — Telephone Encounter (Signed)
Pt is checking status of refill request for her naproxen, called into pharmacy last week, also is out of refills on losartan potassium. Pt goes to [facility]/[address]

## 2014-02-23 ENCOUNTER — Other Ambulatory Visit: Payer: Self-pay | Admitting: Family Medicine

## 2014-02-23 DIAGNOSIS — I10 Essential (primary) hypertension: Secondary | ICD-10-CM

## 2014-03-02 ENCOUNTER — Other Ambulatory Visit: Payer: Self-pay | Admitting: *Deleted

## 2014-03-03 MED ORDER — NAPROXEN 500 MG PO TABS: ORAL_TABLET | ORAL | Status: DC

## 2014-03-03 MED ORDER — PRAVASTATIN SODIUM 40 MG PO TABS: 40.0000 mg | ORAL_TABLET | Freq: Every day | ORAL | Status: DC

## 2014-04-06 ENCOUNTER — Ambulatory Visit (INDEPENDENT_AMBULATORY_CARE_PROVIDER_SITE_OTHER): Payer: Medicare HMO | Admitting: Family Medicine

## 2014-04-06 ENCOUNTER — Encounter: Payer: Self-pay | Admitting: Family Medicine

## 2014-04-06 VITALS — BP 166/89 | HR 71 | Ht 64.0 in | Wt 185.0 lb

## 2014-04-06 DIAGNOSIS — E119 Type 2 diabetes mellitus without complications: Secondary | ICD-10-CM

## 2014-04-06 DIAGNOSIS — M1990 Unspecified osteoarthritis, unspecified site: Secondary | ICD-10-CM

## 2014-04-06 DIAGNOSIS — Z23 Encounter for immunization: Secondary | ICD-10-CM

## 2014-04-06 DIAGNOSIS — K219 Gastro-esophageal reflux disease without esophagitis: Secondary | ICD-10-CM

## 2014-04-06 DIAGNOSIS — I10 Essential (primary) hypertension: Secondary | ICD-10-CM

## 2014-04-06 LAB — COMPREHENSIVE METABOLIC PANEL
ALK PHOS: 125 U/L — AB (ref 39–117)
ALT: 20 U/L (ref 0–35)
AST: 21 U/L (ref 0–37)
Albumin: 4.9 g/dL (ref 3.5–5.2)
BUN: 7 mg/dL (ref 6–23)
CO2: 28 meq/L (ref 19–32)
CREATININE: 0.66 mg/dL (ref 0.50–1.10)
Calcium: 10.3 mg/dL (ref 8.4–10.5)
Chloride: 100 mEq/L (ref 96–112)
Glucose, Bld: 159 mg/dL — ABNORMAL HIGH (ref 70–99)
Potassium: 3.4 mEq/L — ABNORMAL LOW (ref 3.5–5.3)
Sodium: 139 mEq/L (ref 135–145)
Total Bilirubin: 0.6 mg/dL (ref 0.2–1.2)
Total Protein: 8.2 g/dL (ref 6.0–8.3)

## 2014-04-06 LAB — POCT URINALYSIS DIPSTICK
Bilirubin, UA: NEGATIVE
Glucose, UA: NEGATIVE
KETONES UA: NEGATIVE
Nitrite, UA: NEGATIVE
PH UA: 6
Protein, UA: NEGATIVE
Spec Grav, UA: <=1.005
Urobilinogen, UA: 0.2

## 2014-04-06 LAB — CBC
HEMATOCRIT: 43.5 % (ref 36.0–46.0)
Hemoglobin: 15 g/dL (ref 12.0–15.0)
MCH: 27.9 pg (ref 26.0–34.0)
MCHC: 34.5 g/dL (ref 30.0–36.0)
MCV: 80.9 fL (ref 78.0–100.0)
MPV: 9 fL (ref 8.6–12.4)
PLATELETS: 382 10*3/uL (ref 150–400)
RBC: 5.38 MIL/uL — ABNORMAL HIGH (ref 3.87–5.11)
RDW: 14.4 % (ref 11.5–15.5)
WBC: 11.7 10*3/uL — ABNORMAL HIGH (ref 4.0–10.5)

## 2014-04-06 LAB — POCT UA - MICROSCOPIC ONLY

## 2014-04-06 LAB — POCT GLYCOSYLATED HEMOGLOBIN (HGB A1C): Hemoglobin A1C: 7.8

## 2014-04-06 LAB — LIPID PANEL
Cholesterol: 165 mg/dL (ref 0–200)
HDL: 47 mg/dL (ref >39)
LDL Cholesterol: 71 mg/dL (ref 0–99)
Total CHOL/HDL Ratio: 3.5 Ratio
Triglycerides: 235 mg/dL — ABNORMAL HIGH (ref <150)
VLDL: 47 mg/dL — ABNORMAL HIGH (ref 0–40)

## 2014-04-06 MED ORDER — ONETOUCH ULTRA 2 W/DEVICE KIT: 1.0000 | PACK | Freq: Three times a day (TID) | Status: DC

## 2014-04-06 MED ORDER — ACETAMINOPHEN 500 MG PO TABS: 1000.0000 mg | ORAL_TABLET | Freq: Three times a day (TID) | ORAL | Status: DC | PRN

## 2014-04-06 MED ORDER — OMEPRAZOLE 10 MG PO CPDR: 10.0000 mg | DELAYED_RELEASE_CAPSULE | Freq: Every day | ORAL | Status: DC

## 2014-04-06 NOTE — Assessment & Plan Note (Signed)
A1c 7.8 Continue current regimen.

## 2014-04-06 NOTE — Assessment & Plan Note (Signed)
DC Naproxen due to ulcer risk and secondary HTN effects; Start Tylenol

## 2014-04-06 NOTE — Assessment & Plan Note (Signed)
Discontinuing chronic NSAID use. Replacing w/ tylenol. Will recheck BP once NSAIDs are no longer onboard incase HTN improves.   Will monitor.

## 2014-04-06 NOTE — Progress Notes (Signed)
   HPI  Patient presents today for annual physical.  Patient reports being in good health. Some issues w/ hearing recently, "feels like I'm blocked up". Patient also c/o some heart burn. States that its worse after meals. She continues to take ranitidine but says that it doesn't help much. Has tried some of her husbands Nexium--had significant relief. Patient also reports adequate arthritic relief w/ naproxen. She and I had long discussion about the risks of naproxen and NSAIDs. No other complaints. Patient is trying to stay active. Denies HA, fever, chills, vision change, n/v/d/c, SOB, CP, dysuria, vaginal bleeding.  Smoking status noted ROS: Per HPI  Objective: BP 166/89 mmHg  Pulse 71  Ht _0  (1.626 m)  Wt 185 lb (83.915 kg)  BMI 31.74 kg/m2 Gen: NAD, alert, cooperative with exam HEENT: NCAT, EOMI, PERRL CV: RRR, good S1/S2, no murmur Resp: CTABL, no wheezes, non-labored Abd: SNTND, BS present, no guarding or organomegaly Ext: No edema, warm, pulses present and equal bilaterally. Neuro: Alert and oriented, No gross deficits  Assessment and plan:  HYPERTENSION, BENIGN SYSTEMIC Discontinuing chronic NSAID use. Replacing w/ tylenol. Will recheck BP once NSAIDs are no longer onboard incase HTN improves.   Will monitor.   GERD (gastroesophageal reflux disease) DC ranitidine due to ineffectiveness. Starting Omeprazole.  DC Naproxen. Start tylenol.   DM II (diabetes mellitus, type II), controlled A1c 7.8 Continue current regimen.   OA (osteoarthritis) DC Naproxen due to ulcer risk and secondary HTN effects; Start Tylenol     Orders Placed This Encounter  Procedures  . Mammogram Digital Screening    Standing Status: Future     Number of Occurrences:      Standing Expiration Date: 06/05/2015    Order Specific Question:  Reason for exam:    Answer:  screening    Order Specific Question:  Preferred imaging location?    Answer:  [facility]  . Pneumococcal  conjugate vaccine 13-valent  . Flu Vaccine QUAD 36+ mos PF IM (Fluarix Quad PF)  . CBC  . Comprehensive metabolic panel  . Lipid panel  . TSH  . Ambulatory referral to Optometry    Referral Priority:  Routine    Referral Type:  V[employer] planner)    Referral Reason:  Specialty Services Required    Requested Specialty:  Optometry    Number of Visits Requested:  1  . POCT urinalysis dipstick  . POCT UA - Microscopic Only  . POCT glycosylated hemoglobin (Hb A1C)    Meds ordered this encounter  Medications  . acetaminophen (TYLENOL) 500 MG tablet    Sig: Take 2 tablets (1,000 mg total) by mouth every 8 (eight) hours as needed.    Dispense:  30 tablet    Refill:  2  . omeprazole (PRILOSEC) 10 MG capsule    Sig: Take 1 capsule (10 mg total) by mouth daily.    Dispense:  30 capsule    Refill:  3  . Blood Glucose Monitoring Suppl (ONE TOUCH ULTRA 2) W/DEVICE KIT    Sig: 1 Device by Does not apply route 3 (three) times daily.    Dispense:  1 each    Refill:  0     Elberta Leatherwood, MD,MS,  PGY1 04/06/2014 7:30 PM

## 2014-04-06 NOTE — Assessment & Plan Note (Signed)
DC ranitidine due to ineffectiveness. Starting Omeprazole.  DC Naproxen. Start tylenol.

## 2014-04-06 NOTE — Patient Instructions (Signed)
Health Maintenance Adopting a healthy lifestyle and getting preventive care can go a long way to promote health and wellness. Talk with your health care provider about what schedule of regular examinations is right for you. This is a good chance for you to check in with your provider about disease prevention and staying healthy. In between checkups, there are plenty of things you can do on your own. Experts have done a lot of research about which lifestyle changes and preventive measures are most likely to keep you healthy. Ask your health care provider for more information. WEIGHT AND DIET  Eat a healthy diet  Be sure to include plenty of vegetables, fruits, low-fat dairy products, and lean protein.  Do not eat a lot of foods high in solid fats, added sugars, or salt.  Get regular exercise. This is one of the most important things you can do for your health.  Most adults should exercise for at least 150 minutes each week. The exercise should increase your heart rate and make you sweat (moderate-intensity exercise).  Most adults should also do strengthening exercises at least twice a week. This is in addition to the moderate-intensity exercise.  Maintain a healthy weight  Body mass index (BMI) is a measurement that can be used to identify possible weight problems. It estimates body fat based on height and weight. Your health care provider can help determine your BMI and help you achieve or maintain a healthy weight.  For females 61 years of age and older:   A BMI below 18.5 is considered underweight.  A BMI of 18.5 to 24.9 is normal.  A BMI of 25 to 29.9 is considered overweight.  A BMI of 30 and above is considered obese.  Watch levels of cholesterol and blood lipids  You should start having your blood tested for lipids and cholesterol at 72 years of age, then have this test every 5 years.  You may need to have your cholesterol levels checked more often if:  Your lipid or  cholesterol levels are high.  You are older than 72 years of age.  You are at high risk for heart disease.  CANCER SCREENING   Lung Cancer  Lung cancer screening is recommended for adults 77-19 years old who are at high risk for lung cancer because of a history of smoking.  A yearly low-dose CT scan of the lungs is recommended for people who:  Currently smoke.  Have quit within the past 15 years.  Have at least a 30-pack-year history of smoking. A pack year is smoking an average of one pack of cigarettes a day for 1 year.  Yearly screening should continue until it has been 15 years since you quit.  Yearly screening should stop if you develop a health problem that would prevent you from having lung cancer treatment.  Breast Cancer  Practice breast self-awareness. This means understanding how your breasts normally appear and feel.  It also means doing regular breast self-exams. Let your health care provider know about any changes, no matter how small.  If you are in your 20s or 30s, you should have a clinical breast exam (CBE) by a health care provider every 1-3 years as part of a regular health exam.  If you are 15 or older, have a CBE every year. Also consider having a breast X-ray (mammogram) every year.  If you have a family history of breast cancer, talk to your health care provider about genetic screening.  If you are  at high risk for breast cancer, talk to your health care provider about having an MRI and a mammogram every year.  Breast cancer gene (BRCA) assessment is recommended for women who have family members with BRCA-related cancers. BRCA-related cancers include:  Breast.  Ovarian.  Tubal.  Peritoneal cancers.  Results of the assessment will determine the need for genetic counseling and BRCA1 and BRCA2 testing. Cervical Cancer Routine pelvic examinations to screen for cervical cancer are no longer recommended for nonpregnant women who are considered low  risk for cancer of the pelvic organs (ovaries, uterus, and vagina) and who do not have symptoms. A pelvic examination may be necessary if you have symptoms including those associated with pelvic infections. Ask your health care provider if a screening pelvic exam is right for you.   The Pap test is the screening test for cervical cancer for women who are considered at risk.  If you had a hysterectomy for a problem that was not cancer or a condition that could lead to cancer, then you no longer need Pap tests.  If you are older than 65 years, and you have had normal Pap tests for the past 10 years, you no longer need to have Pap tests.  If you have had past treatment for cervical cancer or a condition that could lead to cancer, you need Pap tests and screening for cancer for at least 20 years after your treatment.  If you no longer get a Pap test, assess your risk factors if they change (such as having a new sexual partner). This can affect whether you should start being screened again.  Some women have medical problems that increase their chance of getting cervical cancer. If this is the case for you, your health care provider may recommend more frequent screening and Pap tests.  The human papillomavirus (HPV) test is another test that may be used for cervical cancer screening. The HPV test looks for the virus that can cause cell changes in the cervix. The cells collected during the Pap test can be tested for HPV.  The HPV test can be used to screen women 30 years of age and older. Getting tested for HPV can extend the interval between normal Pap tests from three to five years.  An HPV test also should be used to screen women of any age who have unclear Pap test results.  After 72 years of age, women should have HPV testing as often as Pap tests.  Colorectal Cancer  This type of cancer can be detected and often prevented.  Routine colorectal cancer screening usually begins at 72 years of  age and continues through 72 years of age.  Your health care provider may recommend screening at an earlier age if you have risk factors for colon cancer.  Your health care provider may also recommend using home test kits to check for hidden blood in the stool.  A small camera at the end of a tube can be used to examine your colon directly (sigmoidoscopy or colonoscopy). This is done to check for the earliest forms of colorectal cancer.  Routine screening usually begins at age 50.  Direct examination of the colon should be repeated every 5-10 years through 72 years of age. However, you may need to be screened more often if early forms of precancerous polyps or small growths are found. Skin Cancer  Check your skin from head to toe regularly.  Tell your health care provider about any new moles or changes in   moles, especially if there is a change in a mole's shape or color.  Also tell your health care provider if you have a mole that is larger than the size of a pencil eraser.  Always use sunscreen. Apply sunscreen liberally and repeatedly throughout the day.  Protect yourself by wearing long sleeves, pants, a wide-brimmed hat, and sunglasses whenever you are outside. HEART DISEASE, DIABETES, AND HIGH BLOOD PRESSURE   Have your blood pressure checked at least every 1-2 years. High blood pressure causes heart disease and increases the risk of stroke.  If you are between 75 years and 42 years old, ask your health care provider if you should take aspirin to prevent strokes.  Have regular diabetes screenings. This involves taking a blood sample to check your fasting blood sugar level.  If you are at a normal weight and have a low risk for diabetes, have this test once every three years after 72 years of age.  If you are overweight and have a high risk for diabetes, consider being tested at a younger age or more often. PREVENTING INFECTION  Hepatitis B  If you have a higher risk for  hepatitis B, you should be screened for this virus. You are considered at high risk for hepatitis B if:  You were born in a country where hepatitis B is common. Ask your health care provider which countries are considered high risk.  Your parents were born in a high-risk country, and you have not been immunized against hepatitis B (hepatitis B vaccine).  You have HIV or AIDS.  You use needles to inject street drugs.  You live with someone who has hepatitis B.  You have had sex with someone who has hepatitis B.  You get hemodialysis treatment.  You take certain medicines for conditions, including cancer, organ transplantation, and autoimmune conditions. Hepatitis C  Blood testing is recommended for:  Everyone born from [birth year] through 1963.  Anyone with known risk factors for hepatitis C. Sexually transmitted infections (STIs)  You should be screened for sexually transmitted infections (STIs) including gonorrhea and chlamydia if:  You are sexually active and are younger than 72 years of age.  You are older than 72 years of age and your health care provider tells you that you are at risk for this type of infection.  Your sexual activity has changed since you were last screened and you are at an increased risk for chlamydia or gonorrhea. Ask your health care provider if you are at risk.  If you do not have HIV, but are at risk, it may be recommended that you take a prescription medicine daily to prevent HIV infection. This is called pre-exposure prophylaxis (PrEP). You are considered at risk if:  You are sexually active and do not regularly use condoms or know the HIV status of your partner(s).  You take drugs by injection.  You are sexually active with a partner who has HIV. Talk with your health care provider about whether you are at high risk of being infected with HIV. If you choose to begin PrEP, you should first be tested for HIV. You should then be tested every 3 months for  as long as you are taking PrEP.  PREGNANCY   If you are premenopausal and you may become pregnant, ask your health care provider about preconception counseling.  If you may become pregnant, take 400 to 800 micrograms (mcg) of folic acid every day.  If you want to prevent pregnancy, talk to your  health care provider about birth control (contraception). OSTEOPOROSIS AND MENOPAUSE   Osteoporosis is a disease in which the bones lose minerals and strength with aging. This can result in serious bone fractures. Your risk for osteoporosis can be identified using a bone density scan.  If you are 65 years of age or older, or if you are at risk for osteoporosis and fractures, ask your health care provider if you should be screened.  Ask your health care provider whether you should take a calcium or vitamin D supplement to lower your risk for osteoporosis.  Menopause may have certain physical symptoms and risks.  Hormone replacement therapy may reduce some of these symptoms and risks. Talk to your health care provider about whether hormone replacement therapy is right for you.  HOME CARE INSTRUCTIONS   Schedule regular health, dental, and eye exams.  Stay current with your immunizations.   Do not use any tobacco products including cigarettes, chewing tobacco, or electronic cigarettes.  If you are pregnant, do not drink alcohol.  If you are breastfeeding, limit how much and how often you drink alcohol.  Limit alcohol intake to no more than 1 drink per day for nonpregnant women. One drink equals 12 ounces of beer, 5 ounces of wine, or 1 ounces of hard liquor.  Do not use street drugs.  Do not share needles.  Ask your health care provider for help if you need support or information about quitting drugs.  Tell your health care provider if you often feel depressed.  Tell your health care provider if you have ever been abused or do not feel safe at home. Document Released: 08/29/2010  Document Revised: 06/30/2013 Document Reviewed: 01/15/2013 Exit[employer] Patient Information 2015 [employer]. This information is not intended to replace advice given to you by your health care provider. Make sure you discuss any questions you have with your health care provider. Fat and Cholesterol Control Diet Fat and cholesterol levels in your blood and organs are influenced by your diet. High levels of fat and cholesterol may lead to diseases of the heart, small and large blood vessels, gallbladder, liver, and pancreas. CONTROLLING FAT AND CHOLESTEROL WITH DIET Although exercise and lifestyle factors are important, your diet is key. That is because certain foods are known to raise cholesterol and others to lower it. The goal is to balance foods for their effect on cholesterol and more importantly, to replace saturated and trans fat with other types of fat, such as monounsaturated fat, polyunsaturated fat, and omega-3 fatty acids. On average, a person should consume no more than 15 to 17 g of saturated fat daily. Saturated and trans fats are considered "bad" fats, and they will raise LDL cholesterol. Saturated fats are primarily found in animal products such as meats, butter, and cream. However, that does not mean you need to give up all your favorite foods. Today, there are good tasting, low-fat, low-cholesterol substitutes for most of the things you like to eat. Choose low-fat or nonfat alternatives. Choose round or loin cuts of red meat. These types of cuts are lowest in fat and cholesterol. Chicken (without the skin), fish, veal, and ground turkey breast are great choices. Eliminate fatty meats, such as hot dogs and salami. Even shellfish have little or no saturated fat. Have a 3 oz (85 g) portion when you eat lean meat, poultry, or fish. Trans fats are also called "partially hydrogenated oils." They are oils that have been scientifically manipulated so that they are solid at room   room temperature  resulting in a longer shelf life and improved taste and texture of foods in which they are added. Trans fats are found in stick margarine, some tub margarines, cookies, crackers, and baked goods.  When baking and cooking, oils are a great substitute for butter. The monounsaturated oils are especially beneficial since it is believed they lower LDL and raise HDL. The oils you should avoid entirely are saturated tropical oils, such as coconut and palm.  Remember to eat a lot from food groups that are naturally free of saturated and trans fat, including fish, fruit, vegetables, beans, grains (barley, rice, couscous, bulgur wheat), and pasta (without cream sauces).  IDENTIFYING FOODS THAT LOWER FAT AND CHOLESTEROL  Soluble fiber may lower your cholesterol. This type of fiber is found in fruits such as apples, vegetables such as broccoli, potatoes, and carrots, legumes such as beans, peas, and lentils, and grains such as barley. Foods fortified with plant sterols (phytosterol) may also lower cholesterol. You should eat at least 2 g per day of these foods for a cholesterol lowering effect.  Read package labels to identify low-saturated fats, trans fat free, and low-fat foods at the supermarket. Select cheeses that have only 2 to 3 g saturated fat per ounce. Use a heart-healthy tub margarine that is free of trans fats or partially hydrogenated oil. When buying baked goods (cookies, crackers), avoid partially hydrogenated oils. Breads and muffins should be made from whole grains (whole-wheat or whole oat flour, instead of "flour" or "enriched flour"). Buy non-creamy canned soups with reduced salt and no added fats.  FOOD PREPARATION TECHNIQUES  Never deep-fry. If you must fry, either stir-fry, which uses very little fat, or use non-stick cooking sprays. When possible, broil, bake, or roast meats, and steam vegetables. Instead of putting butter or margarine on vegetables, use lemon and herbs, applesauce, and cinnamon  (for squash and sweet potatoes). Use nonfat yogurt, salsa, and low-fat dressings for salads.  LOW-SATURATED FAT / LOW-FAT FOOD SUBSTITUTES Meats / Saturated Fat (g)  Avoid: Steak, marbled (3 oz/85 g) / 11 g  Choose: Steak, lean (3 oz/85 g) / 4 g  Avoid: Hamburger (3 oz/85 g) / 7 g  Choose: Hamburger, lean (3 oz/85 g) / 5 g  Avoid: Ham (3 oz/85 g) / 6 g  Choose: Ham, lean cut (3 oz/85 g) / 2.4 g  Avoid: Chicken, with skin, dark meat (3 oz/85 g) / 4 g  Choose: Chicken, skin removed, dark meat (3 oz/85 g) / 2 g  Avoid: Chicken, with skin, light meat (3 oz/85 g) / 2.5 g  Choose: Chicken, skin removed, light meat (3 oz/85 g) / 1 g Dairy / Saturated Fat (g)  Avoid: Whole milk (1 cup) / 5 g  Choose: Low-fat milk, 2% (1 cup) / 3 g  Choose: Low-fat milk, 1% (1 cup) / 1.5 g  Choose: Skim milk (1 cup) / 0.3 g  Avoid: Hard cheese (1 oz/28 g) / 6 g  Choose: Skim milk cheese (1 oz/28 g) / 2 to 3 g  Avoid: Cottage cheese, 4% fat (1 cup) / 6.5 g  Choose: Low-fat cottage cheese, 1% fat (1 cup) / 1.5 g  Avoid: Ice cream (1 cup) / 9 g  Choose: Sherbet (1 cup) / 2.5 g  Choose: Nonfat frozen yogurt (1 cup) / 0.3 g  Choose: Frozen fruit bar / trace  Avoid: Whipped cream (1 tbs) / 3.5 g  Choose: Nondairy whipped topping (1 tbs) / 1 g Condiments /  Saturated Fat (g)  Avoid: Mayonnaise (1 tbs) / 2 g  Choose: Low-fat mayonnaise (1 tbs) / 1 g  Avoid: Butter (1 tbs) / 7 g  Choose: Extra light margarine (1 tbs) / 1 g  Avoid: Coconut oil (1 tbs) / 11.8 g  Choose: Olive oil (1 tbs) / 1.8 g  Choose: Corn oil (1 tbs) / 1.7 g  Choose: Safflower oil (1 tbs) / 1.2 g  Choose: Sunflower oil (1 tbs) / 1.4 g  Choose: Soybean oil (1 tbs) / 2.4 g  Choose: Canola oil (1 tbs) / 1 g Document Released: 02/13/2005 Document Revised: 06/10/2012 Document Reviewed: 05/14/2013 Exit[employer] Patient Information 2015 [place], [place]. This information is not intended to replace advice given to you by  your health care provider. Make sure you discuss any questions you have with your health care provider.

## 2014-04-07 ENCOUNTER — Other Ambulatory Visit: Payer: Self-pay | Admitting: Family Medicine

## 2014-04-07 LAB — TSH: TSH: 1.627 u[IU]/mL (ref 0.350–4.500)

## 2014-04-07 NOTE — Telephone Encounter (Signed)
Is at [facility] to get her diabetes supplies and glipicide.

## 2014-04-20 ENCOUNTER — Ambulatory Visit (HOSPITAL_COMMUNITY)
Admission: RE | Admit: 2014-04-20 | Discharge: 2014-04-20 | Disposition: A | Discharge status: Home / Self Care | Payer: Medicare HMO | Source: Ambulatory Visit | Attending: Family Medicine | Admitting: Family Medicine

## 2014-04-20 ENCOUNTER — Other Ambulatory Visit: Payer: Self-pay | Admitting: Family Medicine

## 2014-04-20 ENCOUNTER — Encounter: Payer: Self-pay | Admitting: Family Medicine

## 2014-04-20 ENCOUNTER — Telehealth: Payer: Self-pay | Admitting: Family Medicine

## 2014-04-20 DIAGNOSIS — Z1231 Encounter for screening mammogram for malignant neoplasm of breast: Secondary | ICD-10-CM

## 2014-04-20 DIAGNOSIS — E119 Type 2 diabetes mellitus without complications: Secondary | ICD-10-CM

## 2014-04-20 NOTE — Telephone Encounter (Signed)
Called patient about her lab results. I informed her that I was interested in changing her current statin therapy from pravastatin to atorvastatin (likely 40 mg). I received no answer for my call so I left a message with her asking to have her call our office back and to leave a message for me. I will also be sending her the lab results later today.

## 2014-04-27 ENCOUNTER — Telehealth: Payer: Self-pay | Admitting: Family Medicine

## 2014-04-27 DIAGNOSIS — E119 Type 2 diabetes mellitus without complications: Secondary | ICD-10-CM

## 2014-04-27 NOTE — Telephone Encounter (Signed)
Will forward to MD, see previous phone note.

## 2014-04-27 NOTE — Telephone Encounter (Signed)
Pt called because she is unsure why the doctor changed her medication. Please call the patient to discuss. jw

## 2014-04-28 NOTE — Telephone Encounter (Signed)
All reasons clearly indicated in previous office note.  DC naproxen due to ulcer risk and HTN -- replaced w/ tylenol Changed PPI due to ineffectiveness.

## 2014-04-28 NOTE — Telephone Encounter (Signed)
Patient referring to change in statin, nothing mentioned in previous office note about this    Jessica Leatherwood, MD at 04/20/2014 7:08 PM     Status: Signed       Expand All Collapse All   Called patient about her lab results. I informed her that I was interested in changing her current statin therapy from pravastatin to atorvastatin (likely 40 mg). I received no answer for my call so I left a message with her asking to have her call our office back and to leave a message for me. I will also be sending her the lab results later today.

## 2014-04-28 NOTE — Telephone Encounter (Signed)
Oh! That makes a much more sense! Sorry.  Her lipid panel was elevated to the point that optimal therapy would require a "high intensity statin" -- this would be Lipitor (atorvastatin). If she can't afford this medication than we need to keep her on her current statin BUT my recommendation would be to change at this time.  My apology for the confusion, Rudi Rummage. Loa Socks

## 2014-04-28 NOTE — Telephone Encounter (Signed)
Spoke with patient she states she has been tried on lipitor before and it caused bad muscle cramping, however, patient states that if she needs a stronger med she is willing to try something else just not lipitor. She asks is crestor would be an option. Will forward to PCP.  Also patient needs refill for test strips for one touch meter.

## 2014-04-29 ENCOUNTER — Other Ambulatory Visit: Payer: Self-pay | Admitting: Family Medicine

## 2014-04-29 MED ORDER — ROSUVASTATIN CALCIUM 20 MG PO TABS: 20.0000 mg | ORAL_TABLET | Freq: Every day | ORAL | Status: DC

## 2014-04-29 NOTE — Telephone Encounter (Signed)
Perfect! I have placed a prescription for Crestor to her pharmacy. I have also cancelled her pravastatin in our medication list. She is to take this medication once daily (just like the previous). If she has any issues with this medication, or develops any more muscle aches/pains.etc. Please have patient call our office and stop taking the medication. If she has any questions please let me know and I'd be happy to call her.  Thanks! -Loa Socks

## 2014-04-30 ENCOUNTER — Encounter: Payer: Self-pay | Admitting: Family Medicine

## 2014-04-30 MED ORDER — GLUCOSE BLOOD VI STRP: ORAL_STRIP | Status: DC

## 2014-04-30 NOTE — Telephone Encounter (Signed)
Patient informed of message from MD, expressed understanding.

## 2014-04-30 NOTE — Progress Notes (Unsigned)
Pt came in because she is concerned that she was called and told that her Crestor Rx was called into the pharmacy and when she went to the pharmacy they told her that they did not have it. She also needs more "strips". Pt can be contacted at [phone] please contact her when it is ready . Thanks sr

## 2014-05-01 ENCOUNTER — Other Ambulatory Visit: Payer: Self-pay | Admitting: Family Medicine

## 2014-05-01 DIAGNOSIS — E119 Type 2 diabetes mellitus without complications: Secondary | ICD-10-CM

## 2014-05-01 MED ORDER — GLUCOSE BLOOD VI STRP: ORAL_STRIP | Status: DC

## 2014-05-01 NOTE — Progress Notes (Signed)
I fixed this issue. Crestor and test strips should be waiting for her at her pharmacy. I called her to let her know scripts were there.  Thanks!

## 2014-05-14 LAB — HM DIABETES EYE EXAM

## 2014-06-06 ENCOUNTER — Other Ambulatory Visit: Payer: Self-pay | Admitting: Family Medicine

## 2014-07-02 ENCOUNTER — Encounter: Payer: Self-pay | Admitting: Family Medicine

## 2014-07-02 NOTE — Progress Notes (Signed)
Pt needs a refill on Losartan. Please call the pt when ready, [phone]. Thanks [employer], ASA

## 2014-07-06 ENCOUNTER — Other Ambulatory Visit: Payer: Self-pay | Admitting: Family Medicine

## 2014-07-06 MED ORDER — LOSARTAN POTASSIUM 25 MG PO TABS: 25.0000 mg | ORAL_TABLET | Freq: Every day | ORAL | Status: DC

## 2014-07-06 NOTE — Progress Notes (Signed)
Med refilled. Voicemail unable to be left due to inbox being full.  Can someone make another attempt after lunch? Thank you!

## 2014-07-06 NOTE — Progress Notes (Signed)
Tried calling, no answer, voicemail full.

## 2014-07-22 ENCOUNTER — Other Ambulatory Visit: Payer: Self-pay | Admitting: Family Medicine

## 2014-07-22 NOTE — Telephone Encounter (Signed)
Refill request from pharmacy. Will forward to PCP for review. Daylen Lipsky, CMA.

## 2014-08-05 ENCOUNTER — Encounter: Payer: Self-pay | Admitting: Family Medicine

## 2014-08-05 ENCOUNTER — Ambulatory Visit (INDEPENDENT_AMBULATORY_CARE_PROVIDER_SITE_OTHER): Payer: Medicare HMO | Admitting: Family Medicine

## 2014-08-05 VITALS — BP 183/84 | HR 91 | Temp 97.8°F | Ht 64.0 in | Wt 170.0 lb

## 2014-08-05 DIAGNOSIS — I10 Essential (primary) hypertension: Secondary | ICD-10-CM

## 2014-08-05 DIAGNOSIS — E119 Type 2 diabetes mellitus without complications: Secondary | ICD-10-CM

## 2014-08-05 DIAGNOSIS — N898 Other specified noninflammatory disorders of vagina: Secondary | ICD-10-CM | POA: Insufficient documentation

## 2014-08-05 DIAGNOSIS — L298 Other pruritus: Secondary | ICD-10-CM

## 2014-08-05 LAB — POCT GLYCOSYLATED HEMOGLOBIN (HGB A1C): Hemoglobin A1C: 13.5

## 2014-08-05 MED ORDER — FLUCONAZOLE 150 MG PO TABS: 150.0000 mg | ORAL_TABLET | Freq: Once | ORAL | Status: DC

## 2014-08-05 NOTE — Assessment & Plan Note (Signed)
Patient's BP is elevated today. She claims to have run out of metoprolol. Med was refilled last week but patient unaware. - I have asked patient to pick up prescription and resume taking med. - In one week I'd like her to take her BP at her pharmacy and contact me w/ the value.  - no changes to medications at this time.

## 2014-08-05 NOTE — Patient Instructions (Signed)
It was a pleasure seeing you today in our clinic. Today we discussed your vaginal itching and medications. Here is the treatment plan we have discussed and agreed upon together:   - I have written a prescription for Fluconazole. This is a one time medication. Take this orally. I would like to have you call me in 1 week if your symptoms do not improve or resolve. If they persist then I believe a speculum exam by either myself or a female provider would be justified. - I would like to have you take your blood pressure at your neighborhood pharmacy in one week (after taking your metoprolol). Please contact our office to inform me of your blood preasure!

## 2014-08-05 NOTE — Progress Notes (Signed)
   HPI  CC: Vaginal itching Patient presents c/o vaginal itching x2 months. Began shortly after starting an antibacterial soap in the shower. Itching is at the vulva as well as the vaginal canal. She says that it has gotten bad enough that she has needed to use her fingers to scratch w/in the vaginal canal. Noted some bleeding after scratching w/in canal. Denies significant discharge or odor. Some improvement w/ changing from antibacterial soap, as well as when she used the Premarin vaginal cream. No reports of ulcerations. Patient is "occasionally" sexually active w/ only her husband of many years. Patient was not interested in a pelvic exam today. ROS: no fever, chills, n/v/d, abdominal pain, dysuria, incontinence  Review of Systems   All other systems reviewed and are negative.  Past medical history and social history reviewed and updated in the EMR as appropriate.  Objective: BP 183/84 mmHg  Pulse 91  Temp(Src) 97.8 F (36.6 C) (Oral)  Ht _0  (1.626 m)  Wt 170 lb (77.111 kg)  BMI 29.17 kg/m2 Gen: NAD, alert, cooperative, and pleasant. HEENT: NCAT, EOMI, MMM CV: RRR, no murmur Genital: Patient refused today Resp: CTAB  Assessment and plan:  Vaginal itching Patient reporting symptoms suggestive of candidiasis vaginitis. Patient had symptom onset shortly after initiating use of antibiotic soap in shower. She is also at greater risk due to diabetes. DDx includes atrophic vaginitis--sxs improved w/ Premarin cream but did not resolve, as well as vulvovaginal lichen planus. Patient did not want pelvic exam today. - Fluconazole 126m once. - I have asked patient to call in 1 week if sxs persist. If they do, I will need to perform a pelvic exam and consider other Dxs.  HYPERTENSION, BENIGN SYSTEMIC Patient's BP is elevated today. She claims to have run out of metoprolol. Med was refilled last week but patient unaware. - I have asked patient to pick up prescription and resume taking  med. - In one week I'd like her to take her BP at her pharmacy and contact me w/ the value.  - no changes to medications at this time.  DM II (diabetes mellitus, type II), controlled Will obtain A1c today. Patient asked for "grocery list" to help her shopping and eating healthy. I will leave this up front to be picked up later today.    Orders Placed This Encounter  Procedures  . POCT A1C    Meds ordered this encounter  Medications  . fluconazole (DIFLUCAN) 150 MG tablet    Sig: Take 1 tablet (150 mg total) by mouth once.    Dispense:  1 tablet    Refill:  0     IElberta Leatherwood MD,MS,  PGY1 08/05/2014 7:27 PM

## 2014-08-05 NOTE — Assessment & Plan Note (Signed)
Will obtain A1c today. Patient asked for "grocery list" to help her shopping and eating healthy. I will leave this up front to be picked up later today.

## 2014-08-05 NOTE — Assessment & Plan Note (Signed)
Patient reporting symptoms suggestive of candidiasis vaginitis. Patient had symptom onset shortly after initiating use of antibiotic soap in shower. She is also at greater risk due to diabetes. DDx includes atrophic vaginitis--sxs improved w/ Premarin cream but did not resolve, as well as vulvovaginal lichen planus. Patient did not want pelvic exam today. - Fluconazole 153m once. - I have asked patient to call in 1 week if sxs persist. If they do, I will need to perform a pelvic exam and consider other Dxs.

## 2014-08-06 ENCOUNTER — Encounter: Payer: Self-pay | Admitting: Family Medicine

## 2014-08-06 ENCOUNTER — Telehealth: Payer: Self-pay | Admitting: *Deleted

## 2014-08-06 NOTE — Telephone Encounter (Signed)
Pt informed. Deseree Kennon Holter, CMA

## 2014-08-06 NOTE — Telephone Encounter (Signed)
-----  Message from Elberta Leatherwood, MD sent at 08/05/2014  8:04 PM EDT ----- Please let patient know the Diabetic food list is ready for her up front.  Thank you!

## 2014-09-01 ENCOUNTER — Other Ambulatory Visit: Payer: Self-pay | Admitting: Family Medicine

## 2014-09-10 ENCOUNTER — Encounter: Payer: Self-pay | Admitting: Internal Medicine

## 2014-09-23 ENCOUNTER — Other Ambulatory Visit: Payer: Self-pay | Admitting: Family Medicine

## 2014-09-23 NOTE — Telephone Encounter (Signed)
Pt called because she needs refills on her Metformin and Glipizide called in. jw

## 2014-09-24 MED ORDER — GLIPIZIDE 5 MG PO TABS: ORAL_TABLET | ORAL | Status: DC

## 2014-12-14 ENCOUNTER — Other Ambulatory Visit: Payer: Self-pay | Admitting: Family Medicine

## 2014-12-14 DIAGNOSIS — Z961 Presence of intraocular lens: Secondary | ICD-10-CM | POA: Diagnosis not present

## 2014-12-31 ENCOUNTER — Other Ambulatory Visit: Payer: Self-pay | Admitting: Family Medicine

## 2015-01-11 ENCOUNTER — Ambulatory Visit (INDEPENDENT_AMBULATORY_CARE_PROVIDER_SITE_OTHER): Payer: Medicare HMO | Admitting: Family Medicine

## 2015-01-11 ENCOUNTER — Encounter: Payer: Self-pay | Admitting: Family Medicine

## 2015-01-11 VITALS — BP 166/71 | HR 76 | Temp 97.9°F | Wt 173.0 lb

## 2015-01-11 DIAGNOSIS — N952 Postmenopausal atrophic vaginitis: Secondary | ICD-10-CM

## 2015-01-11 LAB — POCT WET PREP (WET MOUNT): Clue Cells Wet Prep Whiff POC: NEGATIVE

## 2015-01-11 MED ORDER — ESTROGENS, CONJUGATED 0.625 MG/GM VA CREA: TOPICAL_CREAM | Freq: Every day | VAGINAL | Status: DC

## 2015-01-11 NOTE — Patient Instructions (Signed)
Refilled premarin If this doesn't help will likely need to refer you to a gynecologist  Follow up with Dr. Alease Hart in 4-6 weeks  Be well, Dr. Ardelia Mems   Atrophic Vaginitis Atrophic vaginitis is a condition in which the tissues that line the vagina become dry and thin. This condition is most common in women who have stopped having regular menstrual periods (menopause). This usually starts when a woman is 50-72 years old. Estrogen helps to keep the vagina moist. It stimulates the vagina to produce a clear fluid that lubricates the vagina for sexual intercourse. This fluid also protects the vagina from infection. Lack of estrogen can cause the lining of the vagina to get thinner and dryer. The vagina may also shrink in size. It may become less elastic. Atrophic vaginitis tends to get worse over time as a woman's estrogen level drops. CAUSES This condition is caused by the normal drop in estrogen that happens around the time of menopause. RISK FACTORS Certain conditions or situations may lower a woman's estrogen level, which increases her risk of atrophic vaginitis. These include:  Taking medicine that blocks estrogen.  Having ovaries removed surgically.  Being treated for cancer with X-ray treatment (radiation) or medicines (chemotherapy).  Exercising very hard and often.  Having an eating disorder (anorexia).  Giving birth or breastfeeding.  Being over the age of 73.  Smoking. SYMPTOMS Symptoms of this condition include:  Pain, soreness, or bleeding during sexual intercourse (dyspareunia).  Vaginal burning, irritation, or itching.  Pain or bleeding during a vaginal examination using a speculum (pelvic exam).  Loss of interest in sexual activity.  Having burning pain when passing urine.  Vaginal discharge that is brown or yellow. In some cases, there are no symptoms. DIAGNOSIS This condition is diagnosed with a medical history and physical exam. This will include a pelvic  exam that checks whether the inside of your vagina appears pale, thin, or dry. Rarely, you may also have other tests, including:  A urine test.  A test that checks the acid balance in your vaginal fluid (acid balance test). TREATMENT Treatment for this condition may depend on the severity of your symptoms. Treatment may include:  Using an over-the-counter vaginal lubricant before you have sexual intercourse.  Using a long-acting vaginal moisturizer.  Using low-dose vaginal estrogen for moderate to severe symptoms that do not respond to other treatments. Options include creams, tablets, and inserts (vaginal rings). Before using vaginal estrogen, tell your health care provider if you have a history of:  Breast cancer.  Endometrial cancer.  Blood clots.  Taking medicines. You may be able to take a daily pill for dyspareunia. Discuss all of the risks of this medicine with your health care provider. It is usually not recommended for women who have a family history or personal history of breast cancer. If your symptoms are very mild and you are not sexually active, you may not need treatment. HOME CARE INSTRUCTIONS  Take medicines only as directed by your health care provider. Do not use herbal or alternative medicines unless your health care provider says that you can.  Use over-the-counter creams, lubricants, or moisturizers for dryness only as directed by your health care provider.  If your atrophic vaginitis is caused by menopause, discuss all of your menopausal symptoms and treatment options with your health care provider.  Do not douche.  Do not use products that can make your vagina dry. These include:  Scented feminine sprays.  Scented tampons.  Scented soaps.  If  it hurts to have sex, talk with your sexual partner. SEEK MEDICAL CARE IF:  Your discharge looks different than normal.  Your vagina has an unusual smell.  You have new symptoms.  Your symptoms do not  improve with treatment.  Your symptoms get worse.   This information is not intended to replace advice given to you by your health care provider. Make sure you discuss any questions you have with your health care provider.   Document Released: 06/30/2014 Document Reviewed: 06/30/2014 Elsevier Interactive Patient Education [employer].

## 2015-01-11 NOTE — Progress Notes (Signed)
Date of Visit: 01/11/2015   HPI:  Patient presents to discuss vaginal irritation. Reports vagina is dry, itchy, and irritated. Has been scratching a lot. Unsure if she's had discharge. This has gone on for about 2 months. Tried OTC vagisil without relief. Previously used premarin cream which helped but has been out of that for a while. Not presently sexually active. Last sex about 7-8 months ago with husband.  He is her only partner. Denies any history of blood clots, heart attacks, or strokes.   ROS: See HPI.  [place]: history of type 2 diabetes, hyperlipidemia, hypertension, osteopenia, GERD  PHYSICAL EXAM: BP 166/71 mmHg  Pulse 76  Temp(Src) 97.9 F (36.6 C) (Oral)  Wt 173 lb (78.472 kg) Gen: NAD, pleasant, cooperative GU: irritated, atrophic external genitals with some excoriations. Vagina is moist. Cervix slightly erythematous and irritated.   ASSESSMENT/PLAN:  Vaginal atrophy Clearly very atrophic on exam. Discussed options with patient - initially recommended GYN referral given severity of irritation on exam, but she prefers to do trial of premarin cream first. Refill of premarin cream. Also check wet prep. F/u with PCP in 6 weeks.    FOLLOW UP: F/u in 6 weeks with PCP for atrophic vaginitis  [place] J. Ardelia Mems, [place]

## 2015-01-11 NOTE — Assessment & Plan Note (Signed)
Clearly very atrophic on exam. Discussed options with patient - initially recommended GYN referral given severity of irritation on exam, but she prefers to do trial of premarin cream first. Refill of premarin cream. Also check wet prep. F/u with PCP in 6 weeks.

## 2015-02-16 ENCOUNTER — Telehealth: Payer: Self-pay | Admitting: Family Medicine

## 2015-02-16 NOTE — Telephone Encounter (Signed)
Called patient to inquire about how things are going - I had written myself a reminder to check on her. She reports ongoing vaginal irritation. Inquired about any vulvar or vaginal bleeding. She reports having mild bleeding only when she scratches hard, otherwise no bleeding  From vagina. Encouraged her to follow up in clinic for her vaginitis. Appointment scheduled with PCP for this Friday. Patient appreciative.  Leeanne Rio, MD

## 2015-02-19 ENCOUNTER — Ambulatory Visit (INDEPENDENT_AMBULATORY_CARE_PROVIDER_SITE_OTHER): Payer: Medicare HMO | Admitting: Family Medicine

## 2015-02-19 VITALS — BP 166/58 | HR 93 | Temp 98.1°F | Ht 64.0 in | Wt 168.0 lb

## 2015-02-19 DIAGNOSIS — Z23 Encounter for immunization: Secondary | ICD-10-CM

## 2015-02-19 DIAGNOSIS — E785 Hyperlipidemia, unspecified: Secondary | ICD-10-CM

## 2015-02-19 DIAGNOSIS — N952 Postmenopausal atrophic vaginitis: Secondary | ICD-10-CM

## 2015-02-19 DIAGNOSIS — I10 Essential (primary) hypertension: Secondary | ICD-10-CM | POA: Diagnosis not present

## 2015-02-19 DIAGNOSIS — E119 Type 2 diabetes mellitus without complications: Secondary | ICD-10-CM | POA: Diagnosis not present

## 2015-02-19 LAB — POCT GLYCOSYLATED HEMOGLOBIN (HGB A1C): Hemoglobin A1C: 13.2

## 2015-02-19 MED ORDER — LOSARTAN POTASSIUM 50 MG PO TABS: 50.0000 mg | ORAL_TABLET | Freq: Every day | ORAL | Status: DC

## 2015-02-19 MED ORDER — ESTRADIOL 10 MCG VA TABS: 10.0000 ug | ORAL_TABLET | Freq: Every day | VAGINAL | Status: DC

## 2015-02-19 MED ORDER — GLIPIZIDE 5 MG PO TABS: ORAL_TABLET | ORAL | Status: DC

## 2015-02-19 NOTE — Patient Instructions (Addendum)
It was a pleasure seeing you today in our clinic. Today we discussed your vaginal itching, diabetes, high blood pressure, and cholesterol. Here is the treatment plan we have discussed and agreed upon together:   - Today I've prescribed you Vagifem tablets. Insert 1 tablet daily for the next 2 weeks. Then, insert 1 tablet every 3 days after that. This will take time to help resolve your vaginal itching but you should notice results in the next 2-3 weeks. - I would also like you to pick up a vaginal moisturizer. You may apply this one or more times a week to help with vaginal dryness and itching.  - Vaginal moisturizers: Replens, Me Again, Vagisil Feminine Moisturizer, Feminease, and K-Y SILK-E. - Today I have also increased one of your blood pressure medications. I would like for you to take 50 mg of Cozaar (losartan) daily.

## 2015-02-19 NOTE — Assessment & Plan Note (Signed)
Patient has been prescribed  Crestor 20 mg daily. She reports good compliance with this medication but is asking if she needs to continue to take it. I  Informed her that due to her uncontrolled diabetes is definitely necessary at this time. Will follow-up. -  No change in medical management at this time.

## 2015-02-19 NOTE — Progress Notes (Signed)
HPI  CC:  Vaginal itching and dryness  Patient presents with continued vaginal irritation, itching, dryness. She reports that she continues to have the urge to scratch. She's not had any discharge. There is no foul smell. She is been using a mesh and replacement cream but states that many of her symptoms have remained. She has not had any recent sexual intercourse. She  Has had the same partner for many years (husband) and the last time she was sexually active was about 8 months ago.  Patient is looking for await to resolve her current vaginal itching.   We also discussed her diabetes. She states that she is been very compliant with her metformin however has not been very compliant with her glipizide. We discussed that her last A1c was greater than 10. Patient states that she would like to have her A1c drawn today and that she has not had any symptoms of hypoglycemia. She reports no adverse effects from the glipizide causing her to stop taking it.   we also briefly discussed patient's statin. He states that she has been taking this without any side effects or issues. She is wondering whether or not she had to continue taking this medicine.   Patient was noted to be hypertensive again today in the office. Reports good compliance with her 2 antihypertensive medications as she knows it is very important for her to have her blood pressure well controlled. No issues with dizziness or vision changes. No symptoms concerning for hypotensive episodes.   ROS:  Patient denies fevers, chills, vaginal discharge, vaginal bleeding, vaginal odor, nausea, vomiting, diarrhea, abdominal pain, chest tightness, chest pain, shortness of breath, dizziness, lightheadedness, confusion, diaphoresis, night sweats, weight loss, weight gain , weakness, paresthesias, or numbness.  Past medical history and social history reviewed and updated in the EMR as appropriate.  No history of DVT, clots, cancer.  Objective: BP 166/58  mmHg  Pulse 93  Temp(Src) 98.1 F (36.7 C) (Oral)  Ht _0  (1.626 m)  Wt 168 lb (76.204 kg)  BMI 28.82 kg/m2 Gen: NAD, alert, cooperative, and pleasant. CV: RRR, no murmur Resp: CTAB, no wheezes, non-labored Abd: SNTND, BS present, no guarding or organomegaly Neuro: Alert and oriented, Speech clear, No gross deficits  Assessment and plan:  Vaginal atrophy  Patient's complaint of vaginal atrophy. This is been going on for the past 2-3 months. She is significant discomfort with itching.  She's been trying a estrogen replacement vaginal cream. She has had significant confusion with this as it does not correct her on the amounts in which to apply. She would like to seek other options at this time. -  Vagifem 10 g tablets to be applied daily for the next 2 weeks. After that time apply 1 tablet every 3 days intravaginally. -  I advise patient of various vaginal moisturizers. I gave her a list of over-the-counter products which she can try to apply once weekly , at least. With instruction to apply as needed for symptoms.  DM II (diabetes mellitus, type II), controlled  Patient has not had a A1c obtained in 6 months.  A1c today is 13.3. Patient has not been taking her glipizide. I advised that she restart this immediately. She stated her understanding of this. -  We'll follow-up in 3 months.  HYPERTENSION, BENIGN SYSTEMIC  Patient is hypertensive in the clinic today. This is her second consecutive time having a systolic blood pressure in the 160s. -  I have increased her Cozaar to  50 mg daily. -  We'll follow-up  Lipidemia  Patient has been prescribed  Crestor 20 mg daily. She reports good compliance with this medication but is asking if she needs to continue to take it. I  Informed her that due to her uncontrolled diabetes is definitely necessary at this time. Will follow-up. -  No change in medical management at this time.    Orders Placed This Encounter  Procedures  . Flu Vaccine  QUAD 36+ mos IM  . POCT A1C    Meds ordered this encounter  Medications  . Estradiol (VAGIFEM) 10 MCG TABS vaginal tablet    Sig: Place 1 tablet (10 mcg total) vaginally daily. For 2 weeks. Then 1 tablet every 3 days after that.    Dispense:  21 tablet    Refill:  1  . losartan (COZAAR) 50 MG tablet    Sig: Take 1 tablet (50 mg total) by mouth daily.    Dispense:  90 tablet    Refill:  3  . glipiZIDE (GLUCOTROL) 5 MG tablet    Sig: TAKE ONE TABLET BY MOUTH TWICE DAILY BEFORE MEALS    Dispense:  180 tablet    Refill:  3     Elberta Leatherwood, MD,MS,  PGY2 02/19/2015 1:18 PM

## 2015-02-19 NOTE — Assessment & Plan Note (Signed)
Patient has not had a A1c obtained in 6 months.  A1c today is 13.3. Patient has not been taking her glipizide. I advised that she restart this immediately. She stated her understanding of this. -  We'll follow-up in 3 months.

## 2015-02-19 NOTE — Assessment & Plan Note (Signed)
Patient is hypertensive in the clinic today. This is her second consecutive time having a systolic blood pressure in the 160s. -  I have increased her Cozaar to 50 mg daily. -  We'll follow-up

## 2015-02-19 NOTE — Assessment & Plan Note (Signed)
Patient's complaint of vaginal atrophy. This is been going on for the past 2-3 months. She is significant discomfort with itching.  She's been trying a estrogen replacement vaginal cream. She has had significant confusion with this as it does not correct her on the amounts in which to apply. She would like to seek other options at this time. -  Vagifem 10 g tablets to be applied daily for the next 2 weeks. After that time apply 1 tablet every 3 days intravaginally. -  I advise patient of various vaginal moisturizers. I gave her a list of over-the-counter products which she can try to apply once weekly , at least. With instruction to apply as needed for symptoms.

## 2015-02-22 ENCOUNTER — Encounter: Payer: Self-pay | Admitting: Family Medicine

## 2015-02-23 ENCOUNTER — Telehealth: Payer: Self-pay | Admitting: Family Medicine

## 2015-02-23 NOTE — Telephone Encounter (Signed)
Unfortunately this is the only listed vaginal estrogen tablet. If she is unable to cover this type of medication then we can always go back to vaginal ashen cream.  If patient would like to resort to the estrogen cream, then I believe it would be beneficial for her to use 0.5 g twice a day for the first week, daily for the second week, and then every third day after that.  Unfortunately I will be out of the office for the next 6 days. If patient would like to go back to the vaginal cream and please contact one of the available providers to ask if they would be comfortable filling this prescription. (Please direct them to this note)  -Loa Socks

## 2015-02-23 NOTE — Telephone Encounter (Signed)
Pt calling and states that her insurance will not cover Estradiol (VAGIFEM) 10 MCG TABS vaginal tablet. She would like an alternative so that she may receive her medication. Thank you, Fonda Kinder, ASA

## 2015-02-26 NOTE — Telephone Encounter (Signed)
Spoke with patient and informed her of message from PCP, she states she does not want to do the cream. States she has been using otc creams that MD suggested at last office visit and it has bee helping some, she is just going to continue this. FYI to PCP.

## 2015-04-06 ENCOUNTER — Other Ambulatory Visit: Payer: Self-pay | Admitting: Family Medicine

## 2015-04-06 ENCOUNTER — Encounter: Payer: Self-pay | Admitting: Family Medicine

## 2015-04-06 ENCOUNTER — Ambulatory Visit (INDEPENDENT_AMBULATORY_CARE_PROVIDER_SITE_OTHER): Payer: Medicare HMO | Admitting: Family Medicine

## 2015-04-06 VITALS — BP 140/80 | HR 64 | Wt 165.4 lb

## 2015-04-06 DIAGNOSIS — L298 Other pruritus: Secondary | ICD-10-CM

## 2015-04-06 DIAGNOSIS — IMO0001 Reserved for inherently not codable concepts without codable children: Secondary | ICD-10-CM

## 2015-04-06 DIAGNOSIS — E1165 Type 2 diabetes mellitus with hyperglycemia: Secondary | ICD-10-CM

## 2015-04-06 DIAGNOSIS — B373 Candidiasis of vulva and vagina: Secondary | ICD-10-CM | POA: Diagnosis not present

## 2015-04-06 DIAGNOSIS — N952 Postmenopausal atrophic vaginitis: Secondary | ICD-10-CM

## 2015-04-06 DIAGNOSIS — E669 Obesity, unspecified: Secondary | ICD-10-CM

## 2015-04-06 DIAGNOSIS — B3731 Acute candidiasis of vulva and vagina: Secondary | ICD-10-CM

## 2015-04-06 DIAGNOSIS — N898 Other specified noninflammatory disorders of vagina: Secondary | ICD-10-CM

## 2015-04-06 MED ORDER — CEPHALEXIN 500 MG PO CAPS: 500.0000 mg | ORAL_CAPSULE | Freq: Two times a day (BID) | ORAL | Status: DC

## 2015-04-06 MED ORDER — FLUCONAZOLE 150 MG PO TABS: 150.0000 mg | ORAL_TABLET | Freq: Once | ORAL | Status: DC

## 2015-04-06 NOTE — Progress Notes (Signed)
Subjective:    Patient ID: Jessica Hart, female    DOB: [birth year], 75 y.o.   MRN: [id number]  HPI Chief Complaint  Patient presents with  . new pt    new pt, vagina itching off and on for 3 year. never used any sesitive skin soap. no yeast infection. just really irriated    She is new to the practice and here to establish primary care. She also complains vaginal itching for about 3 years off and on. She has been treated with premarin vaginal cream in the past but stopped using it and states it did not help she reports using this for several months. Records show history of severe vaginal atrophy. Patient was not aware of this and did not understand what this diagnosis meant.  She states she likes to take baths and has been using harsh chemical soap "purpose". Also using vaseline and Benadryl topical daily.  No new sexual partners. Reports having sex only with her husband and last time was about 8 months ago. Denies history of STI.  Denies vaginal discharge. Denies fever, chills, nausea, vomiting.    History of uncontrolled DM2. She does not check her blood sugars at home. Reports good compliance with Metformin, Glipizide. Last A1C >13  She is not taking Crestor and states her co-pay went up and she has been out of this for about a month. She did not call her previous PCP to let them know that her co-pay increased, she just stopped taking it.    Review of Systems Pertinent positives and negatives in the history of present illness.     Objective:   Physical Exam  Constitutional: She appears well-developed and well-nourished. No distress.  Genitourinary:    There is lesion on the right labia. There is lesion on the left labia.  Lymphadenopathy:       Right: No inguinal adenopathy present.       Left: No inguinal adenopathy present.   BP 140/80 mmHg  Pulse 64  Wt 165 lb 6.4 oz (75.025 kg)     Assessment & Plan:  Vaginal itching - Plan: fluconazole (DIFLUCAN) 150 MG  tablet, CANCELED: Viral culture  Vaginal atrophy  OBESITY, NOS  Uncontrolled diabetes mellitus type 2 without complications, unspecified long term insulin use status (HCC)  Vulvovaginitis due to Candida - Plan: fluconazole (DIFLUCAN) 150 MG tablet, cephALEXin (KEFLEX) 500 MG capsule, CANCELED: Viral culture  Discussed that the lesions to her vulva may be related to viral etiology such as herpes or possible skin breakdown due to harsh soaps and chemicals. She does have a yeast infection based on KOH swab,  which is contributing to the itching and discomfort. Will treat with Diflucan. Recommend she stop using topical Benadryl, harsh soap containing alcohol and vaseline. Recommend she use only water and mild soap such as Dove sensitive skin. She should also stop taking tub baths and keep area clean and dry. Will also treat for skin breakdown and possible infection with oral antibiotic.  Discussed that her diabetes is uncontrolled as evidenced by her last A1C of >13.  she reports good medication compliance however she does not check blood sugars at home. She also does not watch her sugar or carbohydrate intake. Discussed that uncontrolled blood sugar contributes to development of yeast infections and poor wound healing. She will check her blood sugars twice daily for next week, once fasting and 2 hours after supper and bring in her readings next week at her follow up appointment. She  is supposed to bring in meter as well so we can assess it's accuracy. Recommend that she start cutting back on sugar and carbs, avoid white foods such as potatoes, rice, etc.  Will most likely refer her to Gynecology for further evaluation and treatment of vaginal atrophy and irritation if current treatment plan is not effective.  Spent a minimum of 45 minutes face to face with patient and 50% of that was in patient counseling and coordination of care.

## 2015-04-06 NOTE — Patient Instructions (Addendum)
Stop using any creams, soaps or Vaseline to vaginal area. Try to keep the area clean and dry as much as possible. No tub soaks. Try to avoid touching the area Take the antibiotic twice daily for next 10 days and Diflucan once then take a second pill on Friday. Return in one week to see me. Bring your meter and blood sugar readings. Check your blood sugar every morning before breakfast and 2 hours after supper.

## 2015-04-09 ENCOUNTER — Telehealth: Payer: Self-pay | Admitting: Family Medicine

## 2015-04-09 DIAGNOSIS — B009 Herpesviral infection, unspecified: Secondary | ICD-10-CM

## 2015-04-09 HISTORY — DX: Herpesviral infection, unspecified: B00.9

## 2015-04-09 LAB — HERPES SIMPLEX VIRUS CULTURE: Organism ID, Bacteria: DETECTED

## 2015-04-09 MED ORDER — VALACYCLOVIR HCL 1 G PO TABS: 1000.0000 mg | ORAL_TABLET | Freq: Two times a day (BID) | ORAL | Status: DC

## 2015-04-09 NOTE — Addendum Note (Signed)
Addended by: Minette Headland A on: 04/09/2015 04:48 PM   Modules accepted: Orders

## 2015-04-09 NOTE — Telephone Encounter (Signed)
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 Valacyclovir called to pharmacy and patient provided instructions on how to take this. She was also instructed to return in 2-3 weeks for follow-up. Recommend that she get additional STD testing since she was positive for herpes. Recommend that she discuss with her husband that she has herpes and that he should also be tested if he starts having symptoms.

## 2015-04-16 ENCOUNTER — Encounter: Payer: Self-pay | Admitting: Family Medicine

## 2015-04-16 ENCOUNTER — Ambulatory Visit (INDEPENDENT_AMBULATORY_CARE_PROVIDER_SITE_OTHER): Payer: Medicare HMO | Admitting: Family Medicine

## 2015-04-16 VITALS — BP 128/80 | HR 64 | Wt 168.2 lb

## 2015-04-16 DIAGNOSIS — IMO0001 Reserved for inherently not codable concepts without codable children: Secondary | ICD-10-CM

## 2015-04-16 DIAGNOSIS — Z794 Long term (current) use of insulin: Secondary | ICD-10-CM

## 2015-04-16 DIAGNOSIS — E1165 Type 2 diabetes mellitus with hyperglycemia: Secondary | ICD-10-CM

## 2015-04-16 DIAGNOSIS — E119 Type 2 diabetes mellitus without complications: Secondary | ICD-10-CM | POA: Diagnosis not present

## 2015-04-16 DIAGNOSIS — A6000 Herpesviral infection of urogenital system, unspecified: Secondary | ICD-10-CM | POA: Diagnosis not present

## 2015-04-16 DIAGNOSIS — R69 Illness, unspecified: Secondary | ICD-10-CM | POA: Diagnosis not present

## 2015-04-16 DIAGNOSIS — B009 Herpesviral infection, unspecified: Secondary | ICD-10-CM | POA: Diagnosis not present

## 2015-04-16 LAB — COMPLETE METABOLIC PANEL WITH GFR
ALBUMIN: 4.4 g/dL (ref 3.6–5.1)
ALK PHOS: 110 U/L (ref 33–130)
ALT: 10 U/L (ref 6–29)
AST: 14 U/L (ref 10–35)
BUN: 8 mg/dL (ref 7–25)
CHLORIDE: 100 mmol/L (ref 98–110)
CO2: 21 mmol/L (ref 20–31)
Calcium: 9.6 mg/dL (ref 8.6–10.4)
Creat: 0.71 mg/dL (ref 0.60–0.93)
GFR, EST NON AFRICAN AMERICAN: 85 mL/min (ref >=60)
GFR, Est African American: >89 mL/min (ref >=60)
GLUCOSE: 402 mg/dL — AB (ref 65–99)
POTASSIUM: 4.1 mmol/L (ref 3.5–5.3)
SODIUM: 136 mmol/L (ref 135–146)
Total Bilirubin: 0.5 mg/dL (ref 0.2–1.2)
Total Protein: 7.3 g/dL (ref 6.1–8.1)

## 2015-04-16 LAB — CBC WITH DIFFERENTIAL/PLATELET
BASOS PCT: 1 % (ref 0–1)
Basophils Absolute: 0.1 10*3/uL (ref 0.0–0.1)
EOS ABS: 0.2 10*3/uL (ref 0.0–0.7)
Eosinophils Relative: 2 % (ref 0–5)
HCT: 44.4 % (ref 36.0–46.0)
HEMOGLOBIN: 14.4 g/dL (ref 12.0–15.0)
Lymphocytes Relative: 26 % (ref 12–46)
Lymphs Abs: 2.7 10*3/uL (ref 0.7–4.0)
MCH: 28 pg (ref 26.0–34.0)
MCHC: 32.4 g/dL (ref 30.0–36.0)
MCV: 86.2 fL (ref 78.0–100.0)
MONO ABS: 0.7 10*3/uL (ref 0.1–1.0)
MPV: 9.3 fL (ref 8.6–12.4)
Monocytes Relative: 7 % (ref 3–12)
NEUTROS ABS: 6.5 10*3/uL (ref 1.7–7.7)
NEUTROS PCT: 64 % (ref 43–77)
PLATELETS: 292 10*3/uL (ref 150–400)
RBC: 5.15 MIL/uL — AB (ref 3.87–5.11)
RDW: 15.1 % (ref 11.5–15.5)
WBC: 10.2 10*3/uL (ref 4.0–10.5)

## 2015-04-16 MED ORDER — LINAGLIPTIN 5 MG PO TABS: 5.0000 mg | ORAL_TABLET | Freq: Every day | ORAL | Status: DC

## 2015-04-16 NOTE — Progress Notes (Signed)
   Subjective:    Patient ID: Jessica Hart, female    DOB: August 22, 1940, 75 y.o.   MRN: [id number]  HPI Chief Complaint  Patient presents with  . 1 week follow-up    1 week follow-up. some itching   She is here for follow-up on herpetic infection to genitalia. She was seen for vaginal itching, irritation and tested positive for HSV type 2 last week. This appears to have been her initial outbreak and she was started on valacyclovir. She has been taking this as prescribed. Today she reports that her symptoms have resolved and she feels much better. Denies itching, discharge, pain.  She is still questioning how she contracted HSV and if this is a STI. She states she has never had this before and her husband does not have it.   I asked her to keep a diary of her blood sugars so that we can see if her medication is working. Her last A1C was >13 at her previous clinic.  She only checked her fasting blood sugar yesterday for breakfast and it was 290 and today fasting 347. She admits that she is sometimes forgetting to take her glipizide in the evening. Her diet is nondiscretionary, no exercise.   Review of Systems Pertinent positives and negatives in the history of present illness.     Objective:   Physical Exam  Genitourinary: Vagina normal. There is no rash or lesion on the right labia. There is no rash or lesion on the left labia.   BP 128/80 mmHg  Pulse 64  Wt 168 lb 3.2 oz (76.295 kg)      Assessment & Plan:  Genital herpes simplex type 2 - Plan: GC/Chlamydia Probe Amp, RPR, HIV antibody  Uncontrolled type 2 diabetes mellitus without complication, with long-term current use of insulin ([place]) - Plan: CBC with Differential/Platelet, COMPLETE METABOLIC PANEL WITH GFR, linagliptin (TRADJENTA) 5 MG TABS tablet  Discussed that she was tested and was negative for Herpes in 08/2013 per records. Discussed HSV in depth and that if she has another outbreak that we may want to put her on daily  preventive valtrex. Also recommend that she advise her husband that if he does not have this that he can get it from sexual intercourse with her. Also recommend additional STI testing and patient agreed. Will follow up with results.  Discussed diabetes and that her blood sugars running high places her at greater risk for developing heart disease, kidney disease, blindness, and strokes. Will start her on a 3rd oral medication, she is refusing insulin or weekly injections. Once daily Tradjenta added to her regimen. She will check her blood sugars and return in 2 weeks with a diary of her readings and medication log.

## 2015-04-16 NOTE — Patient Instructions (Addendum)
Continue taking Metformin 2 times daily with meals.  Start Tradjenta 1 tablet once daily with breakfast Continue taking glipizide 2 times daily with meals.   Check your blood sugar fasting and 2 hours after supper or lunch and return with your readings in 2 weeks.   Basic Carbohydrate Counting for Diabetes Mellitus Carbohydrate counting is a method for keeping track of the amount of carbohydrates you eat. Eating carbohydrates naturally increases the level of sugar (glucose) in your blood, so it is important for you to know the amount that is okay for you to have in every meal. Carbohydrate counting helps keep the level of glucose in your blood within normal limits. The amount of carbohydrates allowed is different for every person. A dietitian can help you calculate the amount that is right for you. Once you know the amount of carbohydrates you can have, you can count the carbohydrates in the foods you want to eat. Carbohydrates are found in the following foods:  Grains, such as breads and cereals.  Dried beans and soy products.  Starchy vegetables, such as potatoes, peas, and corn.  Fruit and fruit juices.  Milk and yogurt.  Sweets and snack foods, such as cake, cookies, candy, chips, soft drinks, and fruit drinks. CARBOHYDRATE COUNTING There are two ways to count the carbohydrates in your food. You can use either of the methods or a combination of both. Reading the "Nutrition Facts" on [place] The "Nutrition Facts" is an area that is included on the labels of almost all packaged food and beverages in the [place]. It includes the serving size of that food or beverage and information about the nutrients in each serving of the food, including the grams (g) of carbohydrate per serving.  Decide the number of servings of this food or beverage that you will be able to eat or drink. Multiply that number of servings by the number of grams of carbohydrate that is listed on the label for  that serving. The total will be the amount of carbohydrates you will be having when you eat or drink this food or beverage. Learning Standard Serving Sizes of Food When you eat food that is not packaged or does not include "Nutrition Facts" on the label, you need to measure the servings in order to count the amount of carbohydrates.A serving of most carbohydrate-rich foods contains about 15 g of carbohydrates. The following list includes serving sizes of carbohydrate-rich foods that provide 15 g ofcarbohydrate per serving:   1 slice of bread (1 oz) or 1 six-inch tortilla.    of a hamburger bun or English muffin.  4-6 crackers.   cup unsweetened dry cereal.    cup hot cereal.   cup rice or pasta.    cup mashed potatoes or  of a large baked potato.  1 cup fresh fruit or one small piece of fruit.    cup canned or frozen fruit or fruit juice.  1 cup milk.   cup plain fat-free yogurt or yogurt sweetened with artificial sweeteners.   cup cooked dried beans or starchy vegetable, such as peas, corn, or potatoes.  Decide the number of standard-size servings that you will eat. Multiply that number of servings by 15 (the grams of carbohydrates in that serving). For example, if you eat 2 cups of strawberries, you will have eaten 2 servings and 30 g of carbohydrates (2 servings x 15 g = 30 g). For foods such as soups and casseroles, in which more than one  food is mixed in, you will need to count the carbohydrates in each food that is included. EXAMPLE OF CARBOHYDRATE COUNTING Sample Dinner  3 oz chicken breast.   cup of brown rice.   cup of corn.  1 cup milk.   1 cup strawberries with sugar-free whipped topping.  Carbohydrate Calculation Step 1: Identify the foods that contain carbohydrates:   Rice.   Corn.   Milk.   Strawberries. Step 2:Calculate the number of servings eaten of each:   2 servings of rice.   1 serving of corn.   1 serving of milk.    1 serving of strawberries. Step 3: Multiply each of those number of servings by 15 g:   2 servings of rice x 15 g = 30 g.   1 serving of corn x 15 g = 15 g.   1 serving of milk x 15 g = 15 g.   1 serving of strawberries x 15 g = 15 g. Step 4: Add together all of the amounts to find the total grams of carbohydrates eaten: 30 g + 15 g + 15 g + 15 g = 75 g.   This information is not intended to replace advice given to you by your health care provider. Make sure you discuss any questions you have with your health care provider.   Document Released: 02/13/2005 Document Revised: 03/06/2014 Document Reviewed: 01/10/2013 Elsevier Interactive Patient Education [employer].

## 2015-04-16 NOTE — Assessment & Plan Note (Signed)
Last A1C >13

## 2015-04-17 LAB — HIV ANTIBODY (ROUTINE TESTING W REFLEX): HIV: NONREACTIVE

## 2015-04-17 LAB — RPR

## 2015-04-17 LAB — GC/CHLAMYDIA PROBE AMP
CT PROBE, AMP APTIMA: NOT DETECTED
GC PROBE AMP APTIMA: NOT DETECTED

## 2015-05-04 ENCOUNTER — Ambulatory Visit (INDEPENDENT_AMBULATORY_CARE_PROVIDER_SITE_OTHER): Payer: Medicare HMO | Admitting: Family Medicine

## 2015-05-04 ENCOUNTER — Telehealth: Payer: Self-pay | Admitting: Family Medicine

## 2015-05-04 ENCOUNTER — Encounter: Payer: Self-pay | Admitting: Family Medicine

## 2015-05-04 VITALS — BP 132/82 | HR 68 | Wt 175.2 lb

## 2015-05-04 DIAGNOSIS — I10 Essential (primary) hypertension: Secondary | ICD-10-CM | POA: Diagnosis not present

## 2015-05-04 DIAGNOSIS — IMO0001 Reserved for inherently not codable concepts without codable children: Secondary | ICD-10-CM

## 2015-05-04 DIAGNOSIS — E1165 Type 2 diabetes mellitus with hyperglycemia: Secondary | ICD-10-CM | POA: Diagnosis not present

## 2015-05-04 MED ORDER — "PEN NEEDLES 5/16"" 30G X 8 MM MISC": Status: DC

## 2015-05-04 MED ORDER — INSULIN GLARGINE 100 UNIT/ML SOLOSTAR PEN: 10.0000 [IU] | PEN_INJECTOR | Freq: Every evening | SUBCUTANEOUS | Status: DC

## 2015-05-04 NOTE — Telephone Encounter (Signed)
Pt states went to [facility] too expensive. Called pharmacy & Lantus requires P.A. or switch to preferred Levemir or Tyler Aas  Do you want to switch or would you like me to do the P.A.?   Also the [facility] is going thru but cost is $242 a month

## 2015-05-04 NOTE — Patient Instructions (Addendum)
Stop taking Glipizide.  Start taking Lantus daily every evening. Start with 10 units and increase your dose by 2 units every 2nd day until your fasting blood sugar is less than 120.  Continue metformin twice daily.  Continue Tradjenta daily.  I am referring you to the diabetes nutritionist. Call your insurance and make sure you are aware of your co-pay before you go. Call me Monday with your blood sugars.   Basic Carbohydrate Counting for Diabetes Mellitus Carbohydrate counting is a method for keeping track of the amount of carbohydrates you eat. Eating carbohydrates naturally increases the level of sugar (glucose) in your blood, so it is important for you to know the amount that is okay for you to have in every meal. Carbohydrate counting helps keep the level of glucose in your blood within normal limits. The amount of carbohydrates allowed is different for every person. A dietitian can help you calculate the amount that is right for you. Once you know the amount of carbohydrates you can have, you can count the carbohydrates in the foods you want to eat. Carbohydrates are found in the following foods:  Grains, such as breads and cereals.  Dried beans and soy products.  Starchy vegetables, such as potatoes, peas, and corn.  Fruit and fruit juices.  Milk and yogurt.  Sweets and snack foods, such as cake, cookies, candy, chips, soft drinks, and fruit drinks. CARBOHYDRATE COUNTING There are two ways to count the carbohydrates in your food. You can use either of the methods or a combination of both. Reading the "Nutrition Facts" on [place] The "Nutrition Facts" is an area that is included on the labels of almost all packaged food and beverages in the [place]. It includes the serving size of that food or beverage and information about the nutrients in each serving of the food, including the grams (g) of carbohydrate per serving.  Decide the number of servings of this food or beverage  that you will be able to eat or drink. Multiply that number of servings by the number of grams of carbohydrate that is listed on the label for that serving. The total will be the amount of carbohydrates you will be having when you eat or drink this food or beverage. Learning Standard Serving Sizes of Food When you eat food that is not packaged or does not include "Nutrition Facts" on the label, you need to measure the servings in order to count the amount of carbohydrates.A serving of most carbohydrate-rich foods contains about 15 g of carbohydrates. The following list includes serving sizes of carbohydrate-rich foods that provide 15 g ofcarbohydrate per serving:   1 slice of bread (1 oz) or 1 six-inch tortilla.    of a hamburger bun or English muffin.  4-6 crackers.   cup unsweetened dry cereal.    cup hot cereal.   cup rice or pasta.    cup mashed potatoes or  of a large baked potato.  1 cup fresh fruit or one small piece of fruit.    cup canned or frozen fruit or fruit juice.  1 cup milk.   cup plain fat-free yogurt or yogurt sweetened with artificial sweeteners.   cup cooked dried beans or starchy vegetable, such as peas, corn, or potatoes.  Decide the number of standard-size servings that you will eat. Multiply that number of servings by 15 (the grams of carbohydrates in that serving). For example, if you eat 2 cups of strawberries, you will have eaten 2  servings and 30 g of carbohydrates (2 servings x 15 g = 30 g). For foods such as soups and casseroles, in which more than one food is mixed in, you will need to count the carbohydrates in each food that is included. EXAMPLE OF CARBOHYDRATE COUNTING Sample Dinner  3 oz chicken breast.   cup of brown rice.   cup of corn.  1 cup milk.   1 cup strawberries with sugar-free whipped topping.  Carbohydrate Calculation Step 1: Identify the foods that contain carbohydrates:   Rice.   Corn.   Milk.    Strawberries. Step 2:Calculate the number of servings eaten of each:   2 servings of rice.   1 serving of corn.   1 serving of milk.   1 serving of strawberries. Step 3: Multiply each of those number of servings by 15 g:   2 servings of rice x 15 g = 30 g.   1 serving of corn x 15 g = 15 g.   1 serving of milk x 15 g = 15 g.   1 serving of strawberries x 15 g = 15 g. Step 4: Add together all of the amounts to find the total grams of carbohydrates eaten: 30 g + 15 g + 15 g + 15 g = 75 g.   This information is not intended to replace advice given to you by your health care provider. Make sure you discuss any questions you have with your health care provider.   Document Released: 02/13/2005 Document Revised: 03/06/2014 Document Reviewed: 01/10/2013 Elsevier Interactive Patient Education [employer].

## 2015-05-04 NOTE — Telephone Encounter (Signed)
Please tell her to not get tradjenta. i will discontinue this.  She can take Levemir instead of lantus.  She should start taking glipizide with meals twice daily then. She has this at home and it is cheap.

## 2015-05-04 NOTE — Progress Notes (Signed)
Subjective:    Patient ID: Jessica Hart, female    DOB: 03/28/1940, 75 y.o.   MRN: [id number]  Jessica Hart is a 75 y.o. female who presents for follow-up of Type 2 diabetes mellitus. Her A1c in 01/2015 was 13.2.  She states she is eating a lot of fried food and cookies, candy. She has cut back on sodas.  States she is not moving a lot, not exercising because she states she is "lazy". Sits a lot and watches TV.  Reports frequent urination and hunger. Denies chest pain, palpitations, DOE, orthopnea, LE edema, cough.   Patient is checking home blood sugars.  Fasting blood sugars have been 270, 255, 245, 198. Not checking them other times.  Home blood sugar records: ranging 198-270 How often is blood sugars being checked: once a day Current symptoms include: none.  Patient is checking their feet daily. Any Foot concerns (callous, ulcer, wound, thickened nails, toenail fungus, skin fungus, hammer toe): none Last dilated eye exam- appt tomorrow 3/8 with Dr. Gershon Crane.   Current treatments: Meformin 1000 mg twice daily, aspirin 27m, is not taking glipizide daily. Took samples of Tradjenta for 2 weeks and states she did not know that I called in a prescription.  Medication compliance: good  Current diet: in general, an "unhealthy" diet Current exercise: no regular exercise- walks some Known diabetic complications: none  The following portions of the patient's history were reviewed and updated as appropriate: allergies, current medications, past medical history, past social history and problem list.  ROS as in subjective above.     Objective:    Physical Exam Alert and in no distress otherwise not examined.  Blood pressure 132/82, pulse 68, weight 175 lb 3.2 oz (79.47 kg).  Lab Review Diabetic Labs Latest Ref Rng 04/16/2015 02/19/2015 08/05/2014 04/06/2014 03/21/2013  HbA1c - - 13.2 13.5 7.8 6.4  Microalbumin 0.00 - 1.89 mg/dL - - - - 1.71  Micro/Creat Ratio 0.0 - 30.0 mg/g - -  - - 20.8  Chol 0 - 200 mg/dL - - - 165 -  HDL >39 mg/dL - - - 47 -  Calc LDL 0 - 99 mg/dL - - - 71 -  Triglycerides <150 mg/dL - - - 235(H) -  Creatinine 0.60 - 0.93 mg/dL 0.71 - - 0.66 0.76   BP/Weight 05/04/2015 04/16/2015 04/06/2015 02/19/2015 181/27/5170 Systolic BP [id number] Diastolic BP 82 80 80 58 71  Wt. (Lbs) 175.2 168.2 165.4 168 173  BMI 30.06 28.86 28.38 28.82 29.68   No flowsheet data found.  Jessica Hart  reports that she quit smoking about 9 years ago. She quit smokeless tobacco use about 10 years ago. She reports that she drinks alcohol. She reports that she does not use illicit drugs.     Assessment & Plan:    Uncontrolled type 2 diabetes mellitus without complication, without long-term current use of insulin ([place] - Plan: Amb ref to Medical Nutrition Therapy-MNT  HYPERTENSION, BENIGN SYSTEMIC - Plan: Amb ref to Medical Nutrition Therapy-MNT  1. Rx changes: stop Glipizide. continue on Metformin and Tradjenta and start Lantus daily. start 10 units Lantus and increase by 2 units every 2 days until fasting glucose is <120.  discussed plan with Dr. [employer] 2. Education: Reviewed 'ABCs' of diabetes management (respective goals in parentheses):  A1C (<7), blood pressure (<130/80), and cholesterol (LDL <100). Discussed that her A1c is actually higher than it was in December. Today her A1c is 13.9. Discussed that she  is placing herself at an increased risk of heart attack, stroke, kidney disease and blindness. Discussed that her poor diet, sedentary lifestyle and overall poor management of her diabetes is placing her at an increased risk of target end organ damage and death.  3. Compliance at present is estimated to be fair. Efforts to improve compliance (if necessary) will be directed at dietary modifications: discussed reducing sugar and carbohydrate intake. Referral made to diabetes nutritionist. 4. Follow up: 2 weeks and bring in blood sugars. Encouraged her to call me  next Monday and let me know what her blood sugar readings are at home.  5. May consider referral to Endocrinologist in future if her blood sugars cannot be managed properly.

## 2015-05-05 DIAGNOSIS — H0011 Chalazion right upper eyelid: Secondary | ICD-10-CM | POA: Diagnosis not present

## 2015-05-05 NOTE — Telephone Encounter (Signed)
Since her insurance will not cover lantus, I recommend trying Tyler Aas, it is a long acting insulin similar to Lantus. She will take 10 units daily and do not increase this dose until I see her in 2 weeks. We have samples of this and she can come by for further instructions. Please have her do this.  She should NOT take Glipizide.  Her medications for Diabetes will be Metformin twice daily and Tresiba 10 units daily.  She needs to check her blood sugar daily before breakfast and 2 hours after supper- write these numbers down and call me next Monday with them. Then follow up in 2 weeks.

## 2015-05-05 NOTE — Telephone Encounter (Signed)
Pt was notified and will come and pick up samples

## 2015-05-05 NOTE — Telephone Encounter (Signed)
Which one do you want to switch him too and what dosing

## 2015-05-10 ENCOUNTER — Telehealth: Payer: Self-pay | Admitting: Family Medicine

## 2015-05-10 NOTE — Telephone Encounter (Signed)
Please call and answer her questions. Please find out what her blood sugar readings have been.

## 2015-05-10 NOTE — Telephone Encounter (Signed)
Spoke to patient about her tresiba samples and how to inject and use pen properly

## 2015-05-10 NOTE — Telephone Encounter (Signed)
Pt called and stated that she needs instructions for the flexpen she was given. She states she doesn't remember how to use. Please call pt at [phone].

## 2015-05-13 ENCOUNTER — Encounter: Payer: Medicare HMO | Attending: Family Medicine | Admitting: *Deleted

## 2015-05-13 ENCOUNTER — Encounter: Payer: Self-pay | Admitting: *Deleted

## 2015-05-13 VITALS — Ht 64.0 in | Wt 171.3 lb

## 2015-05-13 DIAGNOSIS — I10 Essential (primary) hypertension: Secondary | ICD-10-CM | POA: Insufficient documentation

## 2015-05-13 DIAGNOSIS — E1165 Type 2 diabetes mellitus with hyperglycemia: Secondary | ICD-10-CM | POA: Diagnosis not present

## 2015-05-13 DIAGNOSIS — E119 Type 2 diabetes mellitus without complications: Secondary | ICD-10-CM

## 2015-05-13 NOTE — Progress Notes (Signed)
Diabetes Self-Management Education  Visit Type: First/Initial  Appt. Start Time: 0930 Appt. End Time: 1100  05/13/2015  [name], identified by name and date of birth, is a 75 y.o. female with a diagnosis of Diabetes: Type 2. Jessica Hart is married and lives with her husband. She notes that she knows what to do to manage her T2DM but is having trouble doing it.Her sister recently passed due to complications of H8IF, ESRD. She is having difficulty making the relationship between her sister's poor health due to poorly managed glucose and her own poor management. She notes that she craves sweets, has trouble with portion control and eats till she is "full" vs satisfied. She doesn't seem to see that drinking regular soda, sweet tea and fruit juice is also a challenge in her management. Upon completion of our visit I believe she has a better understanding of the consequences of poorly managed T2DM.  ASSESSMENT  Height _0  (1.626 m), weight 171 lb 4.8 oz (77.701 kg). Body mass index is 29.39 kg/(m^2).      Diabetes Self-Management Education - 05/13/15 0934    Visit Information   Visit Type First/Initial   Initial Visit   Diabetes Type Type 2   Are you currently following a meal plan? No   Are you taking your medications as prescribed? Yes   Date Diagnosed 2008   Health Coping   How would you rate your overall health? Good   Psychosocial Assessment   Patient Belief/Attitude about Diabetes Motivated to manage diabetes   Self-care barriers None   Self-management support Doctor's office;CDE visits   Other persons present Patient   Patient Concerns Nutrition/Meal planning;Medication;Monitoring;Healthy Lifestyle;Glycemic Control   Special Needs None   Preferred Learning Style No preference indicated   Learning Readiness Change in progress   How often do you need to have someone help you when you read instructions, pamphlets, or other written materials from your doctor or  pharmacy? 2 - Rarely   What is the last grade level you completed in school? no response   Complications   Last HgB A1C per patient/outside source 13.2 %   How often do you check your blood sugar? 1-2 times/day   Fasting Blood glucose range (mg/dL) 130-179;>200  137-262   Have you had a dilated eye exam in the past 12 months? Yes   Have you had a dental exam in the past 12 months? No   Are you checking your feet? Yes   How many days per week are you checking your feet? 7   Dietary Intake   Breakfast boiled egg, 2 [place] bacon, apple 2 slices, blueberry bagel  & butter or 2 slices of cinnamon raisin bread / Oatmeal 1C instant cinnamon raisin or plain +banana   Snack (morning) chips, orange,    Lunch steak & cheese sandwich,  chips, sweet tea with sugar, regular pepsi, regular gingerAle   Dinner fresh greens, Kale, smoked [place] leg, augraten potatoes / ham hock , rice   Snack (evening) Yoplait strawberry yogurt   Beverage(s) sweet tea, pepsi, cranberry juice, water, rare light beer   Exercise   Exercise Type ADL's   Patient Education   Previous Diabetes Education No   Nutrition management  Role of diet in the treatment of diabetes and the relationship between the three main macronutrients and blood glucose level;Meal options for control of blood glucose level and chronic complications.   Physical activity and exercise  Role of exercise on diabetes management, blood  pressure control and cardiac health.   Monitoring Purpose and frequency of SMBG.   Personal strategies to promote health Lifestyle issues that need to be addressed for better diabetes care   Individualized Goals (developed by patient)   Nutrition General guidelines for healthy choices and portions discussed   Physical Activity --  work on getting started again   Medications take my medication as prescribed   Monitoring  test my blood glucose as discussed   Outcomes   Expected Outcomes Demonstrated interest in learning.  Expect positive outcomes   Future DMSE 4-6 wks   Program Status Completed      Individualized Plan for Diabetes Self-Management Training:   Learning Objective:  Patient will have a greater understanding of diabetes self-management. Patient education plan is to attend individual and/or group sessions per assessed needs and concerns.   Plan:   Patient Instructions  We need to eliminate the: Regular Soda - try Diet gingerAle Sweet Tea - start with 1/2 and 1/2 sweet & unsweet   Or try 1/2 sugar and 1/2 splenda Avoid fruit juice - eat the whole fruit  Visualize balance on your plate - Protein - Vegetables - Starch/ If you have a fruit for snack - add some peanut butter or nuts along with it. Consider baking rather than frying Find a smaller plate and decrease your portion sizes  Exercise: Get back with your friends and go to the [employer] [facility] [employer]                  Let's Learn to Swim  Come back to see me: Thursday June 17, 2015 @ 9:30   Expected Outcomes:  Demonstrated interest in learning. Expect positive outcomes  Education material provided: Living Well with Diabetes, Meal plan card and My Plate  If problems or questions, patient to contact team via:  Phone  Future DSME appointment: 4-6 wks

## 2015-05-13 NOTE — Patient Instructions (Signed)
We need to eliminate the: Regular Soda - try Diet gingerAle Sweet Tea - start with 1/2 and 1/2 sweet & unsweet   Or try 1/2 sugar and 1/2 splenda Avoid fruit juice - eat the whole fruit  Visualize balance on your plate - Protein - Vegetables - Starch/ If you have a fruit for snack - add some peanut butter or nuts along with it. Consider baking rather than frying Find a smaller plate and decrease your portion sizes  Exercise: Get back with your friends and go to the [employer]                  [employer] Learn to Swim  Come back to see me: Thursday June 17, 2015 @ 9:30

## 2015-05-19 ENCOUNTER — Ambulatory Visit (INDEPENDENT_AMBULATORY_CARE_PROVIDER_SITE_OTHER): Payer: Medicare HMO | Admitting: Family Medicine

## 2015-05-19 ENCOUNTER — Encounter: Payer: Self-pay | Admitting: Family Medicine

## 2015-05-19 VITALS — BP 144/90 | HR 64 | Wt 172.6 lb

## 2015-05-19 DIAGNOSIS — I10 Essential (primary) hypertension: Secondary | ICD-10-CM

## 2015-05-19 DIAGNOSIS — E1165 Type 2 diabetes mellitus with hyperglycemia: Secondary | ICD-10-CM

## 2015-05-19 DIAGNOSIS — IMO0001 Reserved for inherently not codable concepts without codable children: Secondary | ICD-10-CM

## 2015-05-19 NOTE — Progress Notes (Signed)
   Subjective:    Patient ID: Jessica Hart, female    DOB: May 28, 1940, 75 y.o.   MRN: [id number]  HPI Chief Complaint  Patient presents with  . 2 week follow-up    2 week follow-up. lowest is 114 and highest 177   She is here for diabetes a 2 week follow up on diabetes and medication compliance.   She has been checking her blood sugar daily in the morning and her readings have been between 114 and 262. She states her numbers have been improving since seeing the nutritionist and watching her sugar intake but last night she ate a large bowl of sherbert and her fasting blood sugar today was 177.  She has not been checking blood sugar after meals as I recommended.   Medication compliance is poor and patient states this is due to her not being able to afford her medications.  Medications: Metformin 100 mg twice daily. Not taking Glipizide- we discontinued this at her last visit since she was not compliant with it. She states she has been taking [place] 10 units daily. Requesting more samples and lancets. She states she cannot afford her co-pay and therefore has been unable to get medication refills.   She did not take her blood pressure medication today. Reports she has been taking them daily. Amlodipine 10 mg once daily. Losartan 25 mg once daily. Metoprolol 100 mg 1 pill twice daily.    Review of Systems Pertinent positives and negatives in the history of present illness.     Objective:   Physical Exam BP 144/90 mmHg  Pulse 64  Wt 172 lb 9.6 oz (78.291 kg)  Alert and in no distress. Cardiac exam shows a regular sinus rhythm without murmurs or gallops. Lungs are clear to auscultation.     Assessment & Plan:  Uncontrolled diabetes mellitus type 2 without complications, unspecified long term insulin use status (HCC)  Lipidemia  HYPERTENSION, BENIGN SYSTEMIC  Discussed that she is not being compliant with her medications or lifestyle modifications to control her chronic health  conditions. Discussed that I am willing to work with her but that she has to be willing to take her medication and let me know when she cannot afford medications. She states she is aware of potential complications of uncontrolled diabetes, cholesterol and HTN, including heart attack, stroke, loss of limb, kidney failure, blindness and even death and I reiterated this today. She states she did not file for medication assistance this year with her insurance and that she forgot to do this. She is requesting that we help her by giving samples. Samples of Crestor given #14, Tradjenta #14 also given. Additional samples of  Mickel Baas will speak with patient regarding patient assistance for medications at this visit.  She will call in 2 weeks with twice daily blood sugar readings. Return in 1 month for follow up.

## 2015-05-19 NOTE — Patient Instructions (Addendum)
Looking at the nutrition information on the foods you eat.  Take 10 units of the [place] daily. Continue on metformin. Take the Tradjenta and the samples and find out from your pharmacy what your co-pay has for these.  Take your medication for blood pressure and diabetes daily.  Return as scheduled to see the nutritionist. Call me next Wednesday with your fasting blood sugars and your 2 hours after meal readings.    DASH Eating Plan DASH stands for "Dietary Approaches to Stop Hypertension." The DASH eating plan is a healthy eating plan that has been shown to reduce high blood pressure (hypertension). Additional health benefits may include reducing the risk of type 2 diabetes mellitus, heart disease, and stroke. The DASH eating plan may also help with weight loss. WHAT DO I NEED TO KNOW ABOUT THE DASH EATING PLAN? For the DASH eating plan, you will follow these general guidelines:  Choose foods with a percent daily value for sodium of less than 5% (as listed on the food label).  Use salt-free seasonings or herbs instead of table salt or sea salt.  Check with your health care provider or pharmacist before using salt substitutes.  Eat lower-sodium products, often labeled as "lower sodium" or "no salt added."  Eat fresh foods.  Eat more vegetables, fruits, and low-fat dairy products.  Choose whole grains. Look for the word "whole" as the first word in the ingredient list.  Choose fish and skinless chicken or [place] more often than red meat. Limit fish, poultry, and meat to 6 oz (170 g) each day.  Limit sweets, desserts, sugars, and sugary drinks.  Choose heart-healthy fats.  Limit cheese to 1 oz (28 g) per day.  Eat more home-cooked food and less restaurant, buffet, and fast food.  Limit fried foods.  Cook foods using methods other than frying.  Limit canned vegetables. If you do use them, rinse them well to decrease the sodium.  When eating at a restaurant, ask that your food  be prepared with less salt, or no salt if possible. WHAT FOODS CAN I EAT? Seek help from a dietitian for individual calorie needs. Grains Whole grain or whole wheat bread. Brown rice. Whole grain or whole wheat pasta. Quinoa, bulgur, and whole grain cereals. Low-sodium cereals. Corn or whole wheat flour tortillas. Whole grain cornbread. Whole grain crackers. Low-sodium crackers. Vegetables Fresh or frozen vegetables (raw, steamed, roasted, or grilled). Low-sodium or reduced-sodium tomato and vegetable juices. Low-sodium or reduced-sodium tomato sauce and paste. Low-sodium or reduced-sodium canned vegetables.  Fruits All fresh, canned (in natural juice), or frozen fruits. Meat and Other Protein Products Ground beef (85% or leaner), grass-fed beef, or beef trimmed of fat. Skinless chicken or [place]. Ground chicken or [place]. Pork trimmed of fat. All fish and seafood. Eggs. Dried beans, peas, or lentils. Unsalted nuts and seeds. Unsalted canned beans. Dairy Low-fat dairy products, such as skim or 1% milk, 2% or reduced-fat cheeses, low-fat ricotta or cottage cheese, or plain low-fat yogurt. Low-sodium or reduced-sodium cheeses. Fats and Oils Tub margarines without trans fats. Light or reduced-fat mayonnaise and salad dressings (reduced sodium). Avocado. Safflower, olive, or canola oils. Natural peanut or almond butter. Other Unsalted popcorn and pretzels. The items listed above may not be a complete list of recommended foods or beverages. Contact your dietitian for more options. WHAT FOODS ARE NOT RECOMMENDED? Grains White bread. White pasta. White rice. Refined cornbread. Bagels and croissants. Crackers that contain trans fat. Vegetables Creamed or fried vegetables. Vegetables in a  cheese sauce. Regular canned vegetables. Regular canned tomato sauce and paste. Regular tomato and vegetable juices. Fruits Dried fruits. Canned fruit in light or heavy syrup. Fruit juice. Meat and Other Protein  Products Fatty cuts of meat. Ribs, chicken wings, bacon, sausage, bologna, salami, chitterlings, fatback, hot dogs, bratwurst, and packaged luncheon meats. Salted nuts and seeds. Canned beans with salt. Dairy Whole or 2% milk, cream, half-and-half, and cream cheese. Whole-fat or sweetened yogurt. Full-fat cheeses or blue cheese. Nondairy creamers and whipped toppings. Processed cheese, cheese spreads, or cheese curds. Condiments Onion and garlic salt, seasoned salt, table salt, and sea salt. Canned and packaged gravies. Worcestershire sauce. Tartar sauce. Barbecue sauce. Teriyaki sauce. Soy sauce, including reduced sodium. Steak sauce. Fish sauce. Oyster sauce. Cocktail sauce. Horseradish. Ketchup and mustard. Meat flavorings and tenderizers. Bouillon cubes. Hot sauce. Tabasco sauce. Marinades. Taco seasonings. Relishes. Fats and Oils Butter, stick margarine, lard, shortening, ghee, and bacon fat. Coconut, palm kernel, or palm oils. Regular salad dressings. Other Pickles and olives. Salted popcorn and pretzels. The items listed above may not be a complete list of foods and beverages to avoid. Contact your dietitian for more information. WHERE CAN I FIND MORE INFORMATION? National Heart, Lung, and Blood Institute: travelstabloid.com   This information is not intended to replace advice given to you by your health care provider. Make sure you discuss any questions you have with your health care provider.   Document Released: 02/02/2011 Document Revised: 03/06/2014 Document Reviewed: 12/18/2012 Elsevier Interactive Patient Education [employer].

## 2015-05-21 ENCOUNTER — Telehealth: Payer: Self-pay | Admitting: Family Medicine

## 2015-05-21 DIAGNOSIS — E1165 Type 2 diabetes mellitus with hyperglycemia: Principal | ICD-10-CM

## 2015-05-21 DIAGNOSIS — IMO0001 Reserved for inherently not codable concepts without codable children: Secondary | ICD-10-CM

## 2015-05-21 DIAGNOSIS — Z794 Long term (current) use of insulin: Principal | ICD-10-CM

## 2015-05-21 NOTE — Telephone Encounter (Signed)
Called pt back & she states she can't remember where she got the pt assistance before but it has been 2-3 years.  So I printed out Pt Assistance for Tresiba, [place] and mailed to patient.

## 2015-05-21 NOTE — Telephone Encounter (Signed)
Pt informed I mailed them to her for her to complete and return to office

## 2015-06-02 ENCOUNTER — Other Ambulatory Visit: Payer: Self-pay | Admitting: Family Medicine

## 2015-06-02 NOTE — Telephone Encounter (Signed)
Pt informed and she will come by the office tomorrow

## 2015-06-02 NOTE — Telephone Encounter (Signed)
Jessica Hart, Will you let her know what our plan is for now. We have samples of Tradjenta she can pick up. Based on her blood sugar readings that she is reporting the medication seems to be working well. Thanks.

## 2015-06-02 NOTE — Telephone Encounter (Signed)
Called pharmacy and Tradjenta with insurance is still [phone] & Crestor (generic) is $47, [facility] didn't have active Rx.

## 2015-06-02 NOTE — Telephone Encounter (Signed)
Pt states her numbers have been the falling 30th- 98 1st- 123 2nd- 123 3rd- 148 4th-118 And today was 131.  If patient eats before 7 pm her bs is good the next morning. Pt states that she does not need any insulin meds sent to pharmacy as she has some left but she is almost out of tradjenta pills as we only gave her like 2 weeks of pills. Pt states pharmacy called and states that she got a call for crestor for $47 and she can't really afford that. What do you want her to do. Can laura help her with assistance.

## 2015-06-02 NOTE — Telephone Encounter (Signed)
Please call and find out what her blood sugars are running. We do have samples available for her to pick up for a [place] but not Crestor

## 2015-06-02 NOTE — Telephone Encounter (Signed)
Pt returned Pt Assistance forms. She will run out of samples soon,  Do you have any more samples you can give her to hold til hear back from Pt Assistance ?

## 2015-06-18 ENCOUNTER — Ambulatory Visit (INDEPENDENT_AMBULATORY_CARE_PROVIDER_SITE_OTHER): Payer: Medicare HMO | Admitting: Family Medicine

## 2015-06-18 ENCOUNTER — Encounter: Payer: Self-pay | Admitting: Family Medicine

## 2015-06-18 VITALS — BP 120/64 | HR 72 | Wt 177.2 lb

## 2015-06-18 DIAGNOSIS — I10 Essential (primary) hypertension: Secondary | ICD-10-CM | POA: Diagnosis not present

## 2015-06-18 DIAGNOSIS — IMO0001 Reserved for inherently not codable concepts without codable children: Secondary | ICD-10-CM

## 2015-06-18 DIAGNOSIS — E669 Obesity, unspecified: Secondary | ICD-10-CM

## 2015-06-18 DIAGNOSIS — E1165 Type 2 diabetes mellitus with hyperglycemia: Secondary | ICD-10-CM | POA: Diagnosis not present

## 2015-06-18 LAB — POCT UA - MICROALBUMIN
CREATININE, POC: 104.7 mg/dL
MICROALBUMIN (UR) POC: 10.8 mg/L

## 2015-06-18 LAB — POCT GLYCOSYLATED HEMOGLOBIN (HGB A1C)

## 2015-06-18 MED ORDER — METOPROLOL TARTRATE 100 MG PO TABS: 100.0000 mg | ORAL_TABLET | Freq: Two times a day (BID) | ORAL | Status: DC

## 2015-06-18 MED ORDER — AMLODIPINE BESYLATE 10 MG PO TABS: 10.0000 mg | ORAL_TABLET | Freq: Every day | ORAL | Status: DC

## 2015-06-18 MED ORDER — LOSARTAN POTASSIUM 25 MG PO TABS: 25.0000 mg | ORAL_TABLET | Freq: Every day | ORAL | Status: DC

## 2015-06-18 MED ORDER — INSULIN DEGLUDEC 100 UNIT/ML ~~LOC~~ SOPN: 10.0000 [IU] | PEN_INJECTOR | Freq: Every day | SUBCUTANEOUS | Status: DC

## 2015-06-18 MED ORDER — METFORMIN HCL 1000 MG PO TABS: 1000.0000 mg | ORAL_TABLET | Freq: Two times a day (BID) | ORAL | Status: DC

## 2015-06-18 MED ORDER — ROSUVASTATIN CALCIUM 20 MG PO TABS: 20.0000 mg | ORAL_TABLET | Freq: Every day | ORAL | Status: DC

## 2015-06-18 MED ORDER — LINAGLIPTIN 5 MG PO TABS
5.0000 mg | ORAL_TABLET | Freq: Every day | ORAL | Status: DC
Start: 2015-06-18 — End: 2016-05-08

## 2015-06-18 NOTE — Patient Instructions (Addendum)
Make sure you're getting a minimum of 150 minutes of physical activity per week. Read all of the nutrition labels and be aware of the carbohydrates, fat and calories. Continue checking your blood sugar daily and taking all of your medication as prescribed. Follow-up in 4 months or sooner if your blood sugars are staying elevated as discussed.   Basic Carbohydrate Counting for Diabetes Mellitus Carbohydrate counting is a method for keeping track of the amount of carbohydrates you eat. Eating carbohydrates naturally increases the level of sugar (glucose) in your blood, so it is important for you to know the amount that is okay for you to have in every meal. Carbohydrate counting helps keep the level of glucose in your blood within normal limits. The amount of carbohydrates allowed is different for every person. A dietitian can help you calculate the amount that is right for you. Once you know the amount of carbohydrates you can have, you can count the carbohydrates in the foods you want to eat. Carbohydrates are found in the following foods:  Grains, such as breads and cereals.  Dried beans and soy products.  Starchy vegetables, such as potatoes, peas, and corn.  Fruit and fruit juices.  Milk and yogurt.  Sweets and snack foods, such as cake, cookies, candy, chips, soft drinks, and fruit drinks. CARBOHYDRATE COUNTING There are two ways to count the carbohydrates in your food. You can use either of the methods or a combination of both. Reading the "Nutrition Facts" on [place] The "Nutrition Facts" is an area that is included on the labels of almost all packaged food and beverages in the [place]. It includes the serving size of that food or beverage and information about the nutrients in each serving of the food, including the grams (g) of carbohydrate per serving.  Decide the number of servings of this food or beverage that you will be able to eat or drink. Multiply that number of  servings by the number of grams of carbohydrate that is listed on the label for that serving. The total will be the amount of carbohydrates you will be having when you eat or drink this food or beverage. Learning Standard Serving Sizes of Food When you eat food that is not packaged or does not include "Nutrition Facts" on the label, you need to measure the servings in order to count the amount of carbohydrates.A serving of most carbohydrate-rich foods contains about 15 g of carbohydrates. The following list includes serving sizes of carbohydrate-rich foods that provide 15 g ofcarbohydrate per serving:   1 slice of bread (1 oz) or 1 six-inch tortilla.    of a hamburger bun or English muffin.  4-6 crackers.   cup unsweetened dry cereal.    cup hot cereal.   cup rice or pasta.    cup mashed potatoes or  of a large baked potato.  1 cup fresh fruit or one small piece of fruit.    cup canned or frozen fruit or fruit juice.  1 cup milk.   cup plain fat-free yogurt or yogurt sweetened with artificial sweeteners.   cup cooked dried beans or starchy vegetable, such as peas, corn, or potatoes.  Decide the number of standard-size servings that you will eat. Multiply that number of servings by 15 (the grams of carbohydrates in that serving). For example, if you eat 2 cups of strawberries, you will have eaten 2 servings and 30 g of carbohydrates (2 servings x 15 g = 30 g). For  foods such as soups and casseroles, in which more than one food is mixed in, you will need to count the carbohydrates in each food that is included. EXAMPLE OF CARBOHYDRATE COUNTING Sample Dinner  3 oz chicken breast.   cup of brown rice.   cup of corn.  1 cup milk.   1 cup strawberries with sugar-free whipped topping.  Carbohydrate Calculation Step 1: Identify the foods that contain carbohydrates:   Rice.   Corn.   Milk.   Strawberries. Step 2:Calculate the number of servings eaten  of each:   2 servings of rice.   1 serving of corn.   1 serving of milk.   1 serving of strawberries. Step 3: Multiply each of those number of servings by 15 g:   2 servings of rice x 15 g = 30 g.   1 serving of corn x 15 g = 15 g.   1 serving of milk x 15 g = 15 g.   1 serving of strawberries x 15 g = 15 g. Step 4: Add together all of the amounts to find the total grams of carbohydrates eaten: 30 g + 15 g + 15 g + 15 g = 75 g.   This information is not intended to replace advice given to you by your health care provider. Make sure you discuss any questions you have with your health care provider.   Document Released: 02/13/2005 Document Revised: 03/06/2014 Document Reviewed: 01/10/2013 Elsevier Interactive Patient Education [employer].

## 2015-06-18 NOTE — Telephone Encounter (Signed)
Called Pt Assistance [facility] & they are not offering any assistance for [place].  Pt was given samples Crestor, Tyler Aas & Tradjenta

## 2015-06-18 NOTE — Progress Notes (Signed)
Subjective:    Patient ID: Jessica Hart, female    DOB: [birth year], 75 y.o.   MRN: [id number]  GRAVIELA Hart is a 75 y.o. female who presents for follow-up of Type 2 diabetes mellitus.  Patient is checking home blood sugars.  Checking blood sugar once daily and ranges from 90s- 170, the majority of her fasting blood sugars are staying in the 120s. Has dropped a pant size but weight has increased. She states she started exercising and feels better.   Home blood sugar records: BGs consistently in an acceptable range How often is blood sugars being checked: once a day, suppose to be taking it 3 times a day Current symptoms include: none. Patient denies nausea.  Patient is checking their feet daily. Any Foot concerns (callous, ulcer, wound, thickened nails, toenail fungus, skin fungus, hammer toe): none. Has had a callous for at least 50 years and states this is unchanged.  Last dilated eye exam about a year ago. Has appointment in August.   Current treatments: increased aerobic exercise which has been somewhat effective. Tadjenta 5 mg once daily, Tresiba 10 units once every night, Metformin 1000 mg bid.  Medication compliance: adequate  Current diet: on average, 3 meals per day Current exercise: aerobics Known diabetic complications: none  The following portions of the patient's history were reviewed and updated as appropriate: allergies, current medications, past medical history, past social history and problem list.  ROS as in subjective above.     Objective:    Physical Exam Alert and in no distress. Diabetic foot exam performed today and documented in QM section.   Blood pressure 120/64, pulse 72, weight 177 lb 3.2 oz (80.377 kg).  Lab Review Diabetic Labs Latest Ref Rng 06/18/2015 04/16/2015 02/19/2015 08/05/2014 04/06/2014  HbA1c - 9.2% - 13.2 13.5 7.8  Microalbumin 0.00 - 1.89 mg/dL - - - - -  Micro/Creat Ratio 0.0 - 30.0 mg/g - - - - -  Chol 0 - 200 mg/dL - - - - 165    HDL >39 mg/dL - - - - 47  Calc LDL 0 - 99 mg/dL - - - - 71  Triglycerides <150 mg/dL - - - - 235(H)  Creatinine 0.60 - 0.93 mg/dL - 0.71 - - 0.66   BP/Weight 06/18/2015 05/19/2015 05/13/2015 05/04/2015 0/73/7106  Systolic BP 269 485 - 462 703  Diastolic BP 64 90 - 82 80  Wt. (Lbs) 177.2 172.6 171.3 175.2 168.2  BMI 30.4 29.61 29.39 30.06 28.86   Foot/eye exam completion dates 06/18/2015  Foot Form Completion Done    Glennie  reports that she quit smoking about 9 years ago. She quit smokeless tobacco use about 10 years ago. She reports that she drinks alcohol. She reports that she does not use illicit drugs.     Assessment & Plan:    HYPERTENSION, BENIGN SYSTEMIC  OBESITY, NOS  Uncontrolled diabetes mellitus type 2 without complications, unspecified long term insulin use status ([place]) - Plan: POCT UA - Microalbumin, POCT glycosylated hemoglobin (Hb A1C)  1. Rx changes: none A1c improved from 13.2 to 9.2 on current medication regimen.  2. Education: Reviewed 'ABCs' of diabetes management (respective goals in parentheses):  A1C (<7), blood pressure (<130/80), and cholesterol (LDL <100). 3. Compliance at present is estimated to be adequate. Efforts to improve compliance (if necessary) will be directed at dietary modifications: look at nutrition labels and watch the carbohydrates, fat, and calories. , increased exercise and regular blood sugar monitoring: 1-2  times  daily. 4. Follow up: 4 months

## 2015-06-18 NOTE — Telephone Encounter (Signed)
[place] did go thru for $336 for 5 pens because they don't break up packs.  Ins paid approximately $150 and rest is pt responsibility. Per the rep all medication of this type will cost the same thing, pt must have high deductible.  Pt is not able to afford this, so we can continue her on this as long as we have samples or you can switch to Levemir and we can try for Pt Assistance for Levemir.   Which would you like to do?

## 2015-06-21 NOTE — Telephone Encounter (Signed)
Let's switch her to Levemir then since [place] is not affordable. Levemir 10 units subcutaneous every evening, same dosing as Tresiba. Please tell her to keep an eye on blood sugars after switching and let me know if she sees a significant difference. Thanks.

## 2015-06-25 ENCOUNTER — Telehealth: Payer: Self-pay | Admitting: Family Medicine

## 2015-06-25 NOTE — Telephone Encounter (Signed)
Pt Assistance forms completed for Levemir

## 2015-06-30 NOTE — Telephone Encounter (Signed)
Provider completed form and was faxed to [employer]

## 2015-07-09 DIAGNOSIS — I10 Essential (primary) hypertension: Secondary | ICD-10-CM | POA: Diagnosis not present

## 2015-07-09 DIAGNOSIS — Z0000 Encounter for general adult medical examination without abnormal findings: Secondary | ICD-10-CM | POA: Diagnosis not present

## 2015-07-09 DIAGNOSIS — E7800 Pure hypercholesterolemia, unspecified: Secondary | ICD-10-CM | POA: Diagnosis not present

## 2015-07-09 DIAGNOSIS — E119 Type 2 diabetes mellitus without complications: Secondary | ICD-10-CM | POA: Diagnosis not present

## 2015-07-09 DIAGNOSIS — Z794 Long term (current) use of insulin: Secondary | ICD-10-CM | POA: Diagnosis not present

## 2015-07-19 ENCOUNTER — Telehealth: Payer: Self-pay | Admitting: Family Medicine

## 2015-07-19 NOTE — Telephone Encounter (Signed)
Recv'd letter from [employer] needing additional info for [employer], this was completed and mailed

## 2015-08-17 ENCOUNTER — Telehealth: Payer: Self-pay | Admitting: Family Medicine

## 2015-08-17 NOTE — Telephone Encounter (Signed)
Pt states when she gives herself the [place] injection that she feels some pain in her stomach,  Not while injecting the medication but afterwards.  Wanted to know if this is normal.  Please call pt

## 2015-08-17 NOTE — Telephone Encounter (Signed)
Please call and see if this is new or has been happening since starting the medication or is this new. How long does the pain last? Any other symptoms such as bruising, nausea, vomiting?

## 2015-08-18 NOTE — Telephone Encounter (Signed)
Spoke to pt. She states that her pain is not at the injection site-its on the other side of stomach. She started exercising so she is not sure if that is causing the pain. Pt was advised to schedule something for the stomach pain if it continues

## 2015-08-24 NOTE — Telephone Encounter (Signed)
Pt Assistance approved til end of year, left message for pt

## 2015-08-27 ENCOUNTER — Telehealth: Payer: Self-pay | Admitting: Family Medicine

## 2015-08-27 NOTE — Telephone Encounter (Signed)
Pt was finally approved for Jessica Hart, you switched her Jessica Hart to Levemir and there was no Pt Assistance for Crestor.  So do you want to switch her to another cholesterol medication?

## 2015-08-27 NOTE — Telephone Encounter (Signed)
Jessica Hart, We can switch her to a different cholesterol medication. Can you find out which one would be covered?

## 2015-08-30 MED ORDER — LOVASTATIN 20 MG PO TABS: 20.0000 mg | ORAL_TABLET | Freq: Two times a day (BID) | ORAL | Status: DC

## 2015-08-30 NOTE — Telephone Encounter (Signed)
Called [facility] Lovastatin 31m on $4 list, per Vickie need 2 a day, called into pharmacy, he will run with and without insurance to see which it is cheaper but the most it will cost is $8.  Left message for pt

## 2015-09-01 NOTE — Telephone Encounter (Addendum)
Called pharmacy & Lovastatin is only $2 for 30 days.  Left message for pt

## 2015-09-01 NOTE — Telephone Encounter (Signed)
I am ok with this. Thank you for helping her with medication.

## 2015-09-01 NOTE — Telephone Encounter (Signed)
Levemir denied, pt needs to pay at least $1000 out of pocket for medications before can be approved

## 2015-09-01 NOTE — Telephone Encounter (Signed)
Called in Levemir per Vickie's notes & spoke with pharmacist Barbaraann Rondo & Levemir vial is $275 for 30 days, Pens $336 for 5 pens.  No discount card is available because she has Medicare.  Pt can not afford this.   I had him check Bas[place]lar and it is not covered.   I had already done a Pt Assistance for Levemir & it was denied til pt spends $1000 out of pocket for medications.   So only option is to give samples until she pays $1000 out of pocket.  Is this ok?

## 2015-09-20 ENCOUNTER — Other Ambulatory Visit: Payer: Self-pay | Admitting: *Deleted

## 2015-09-20 ENCOUNTER — Telehealth: Payer: Self-pay | Admitting: Family Medicine

## 2015-09-20 DIAGNOSIS — E119 Type 2 diabetes mellitus without complications: Secondary | ICD-10-CM

## 2015-09-20 MED ORDER — GLUCOSE BLOOD VI STRP: ORAL_STRIP | 0 refills | Status: DC

## 2015-09-20 MED ORDER — GLUCOSE BLOOD VI STRP: 1.0000 | ORAL_STRIP | Freq: Every day | 0 refills | Status: DC

## 2015-09-20 NOTE — Telephone Encounter (Signed)
Pt asked for samples test strips & we are out.  Asked that we find out which is cheaper at pharmacy and send in Rx for that

## 2015-09-23 DIAGNOSIS — R69 Illness, unspecified: Secondary | ICD-10-CM | POA: Diagnosis not present

## 2015-09-23 NOTE — Telephone Encounter (Signed)
Called [facility] & Verio test strips went thru for $0.  Pt informed

## 2015-09-25 ENCOUNTER — Other Ambulatory Visit: Payer: Self-pay | Admitting: Family Medicine

## 2015-10-19 ENCOUNTER — Ambulatory Visit (INDEPENDENT_AMBULATORY_CARE_PROVIDER_SITE_OTHER): Payer: Medicare HMO | Admitting: Family Medicine

## 2015-10-19 ENCOUNTER — Encounter: Payer: Self-pay | Admitting: Family Medicine

## 2015-10-19 VITALS — BP 122/80 | HR 78 | Wt 183.6 lb

## 2015-10-19 DIAGNOSIS — M8580 Other specified disorders of bone density and structure, unspecified site: Secondary | ICD-10-CM

## 2015-10-19 DIAGNOSIS — E669 Obesity, unspecified: Secondary | ICD-10-CM | POA: Diagnosis not present

## 2015-10-19 DIAGNOSIS — E1165 Type 2 diabetes mellitus with hyperglycemia: Secondary | ICD-10-CM

## 2015-10-19 DIAGNOSIS — I10 Essential (primary) hypertension: Secondary | ICD-10-CM

## 2015-10-19 DIAGNOSIS — K219 Gastro-esophageal reflux disease without esophagitis: Secondary | ICD-10-CM

## 2015-10-19 DIAGNOSIS — E781 Pure hyperglyceridemia: Secondary | ICD-10-CM | POA: Diagnosis not present

## 2015-10-19 DIAGNOSIS — IMO0001 Reserved for inherently not codable concepts without codable children: Secondary | ICD-10-CM

## 2015-10-19 LAB — POCT GLYCOSYLATED HEMOGLOBIN (HGB A1C): Hemoglobin A1C: 7.4

## 2015-10-19 NOTE — Progress Notes (Signed)
Subjective:    Patient ID: Jessica Hart, female    DOB: January 05, 1941, 75 y.o.   MRN: [id number]  Jessica Hart is a 75 y.o. female who presents for follow-up of Type 2 diabetes mellitus.  Complains of a several month history of a burning sensation in throat "only when I am hungry". Denies difficulty swallowing. Also has history of reflux and notices some indigestion and reflux approximately 2-3 times per week. Usually takes tums and gets adequate relief with this.  She is having some diarrhea with the Metformin but states this is not new. States diarrhea is only when she eats a lot of sweets and other days when she eats healthy she does not have a problem with this.  She is exercising 3 days per week and states her clothes are fitting better, shirts especially. She has gained 5 lbs however. States she thinks this is related to diet.   Patient is checking home blood sugars.   Home blood sugar records: BGs range between 94 and 186 How often is blood sugars being checked: once a day always fasting.  Current symptoms include: increase appetite. Patient denies nausea, visual disturbances and vomiting.  Patient is checking their feet daily. Any Foot concerns (callous, ulcer, wound, thickened nails, toenail fungus, skin fungus, hammer toe): none Last dilated eye exam: [facility]  Current treatments: Metformin, Tradjenta, Tresiba. . Medication compliance: excellent  Current diet: in general, an "unhealthy" diet eats potatoes, chips, ice crea, Current exercise: aerobics "jazzercise" 40 minutes 3 times per week. Nothing for  Known diabetic complications: none  The following portions of the patient's history were reviewed and updated as appropriate: allergies, current medications, past medical history, past social history and problem list.  Denies fever, chills, dizziness, chest pain, DOE, orthopnea, abdominal pain, nausea, vomiting, LE edema.   ROS as in subjective above.       Objective:    Physical Exam Alert and in no distress.  Pharyngeal area is normal. Neck is supple without adenopathy or thyromegaly. Cardiac exam shows a regular sinus rhythm without murmurs or gallops. Lungs are clear to auscultation.   Blood pressure 122/80, pulse 78, weight 183 lb 9.6 oz (83.3 kg).  Lab Review Diabetic Labs Latest Ref Rng & Units 10/19/2015 06/18/2015 04/16/2015 02/19/2015 08/05/2014  HbA1c - 7.4% 9.2% - 13.2 13.5  Microalbumin 0.00 - 1.89 mg/dL - - - - -  Micro/Creat Ratio 0.0 - 30.0 mg/g - - - - -  Chol 0 - 200 mg/dL - - - - -  HDL >39 mg/dL - - - - -  Calc LDL 0 - 99 mg/dL - - - - -  Triglycerides <150 mg/dL - - - - -  Creatinine 0.60 - 0.93 mg/dL - - 0.71 - -   BP/Weight 10/19/2015 06/18/2015 05/19/2015 5/46/5681 04/05/5168  Systolic BP 017 494 496 - 759  Diastolic BP 80 64 90 - 82  Wt. (Lbs) 183.6 177.2 172.6 171.3 175.2  BMI 31.51 30.4 29.61 29.39 30.06   Foot/eye exam completion dates 06/18/2015  Foot Form Completion Done    Blima  reports that she quit smoking about 10 years ago. She quit smokeless tobacco use about 10 years ago. She reports that she drinks alcohol. She reports that she does not use drugs.     Assessment & Plan:    Uncontrolled diabetes mellitus type 2 without complications, unspecified long term insulin use status ([place]) - Plan: POCT glycosylated hemoglobin (Hb A1C)  OBESITY, NOS  HYPERTENSION,  BENIGN SYSTEMIC  Gastroesophageal reflux disease, esophagitis presence not specified  1. Rx changes: none hemoglobin A1c today is 7.4%. 2. Education: Reviewed 'ABCs' of diabetes management (respective goals in parentheses):  A1C (<7), blood pressure (<130/80), and cholesterol (LDL <100). 3. Compliance at present is estimated to be good. Efforts to improve compliance (if necessary) will be directed at dietary modifications: Reduce carbohydrates and try to cut back on white foods such as potatoes, rice, pasta, sugar., increased exercise and  regular blood sugar monitoring: daily. 4. Follow up: 4 months for diabetes 5. GERD: Try the Dexilant samples, once daily for the next 10 days and see if she notices improvement with symptoms. If she does not improve I then plan to refer to GI. She will follow up in 10 days.  6. Blood pressure is within goal range. No changes to medications. Continue with healthy lifestyle modifications. 7. Weight gain is most likely related to increased sugar, carbs and calories.  8. She is not fasting today so she will return tomorrow morning for labs. Orders are in the computer.

## 2015-10-19 NOTE — Patient Instructions (Signed)
Try cutting back on carbohydrates and sugar. Try to get healthy snacks and cut out potato chips and sweets.  Walk 20 minutes 2 days per week aside from the 3 days you do jazzercise.   You're A1c has improved and today is 7.4%.   For the reflux- try taking 1 Dexilant pill 30 minutes before lunch for the next 10 days and see if this helps. Follow up with me in 10-12 days for this.

## 2015-10-20 ENCOUNTER — Telehealth: Payer: Self-pay | Admitting: Internal Medicine

## 2015-10-20 ENCOUNTER — Other Ambulatory Visit: Payer: Medicare HMO

## 2015-10-20 DIAGNOSIS — E1165 Type 2 diabetes mellitus with hyperglycemia: Principal | ICD-10-CM

## 2015-10-20 DIAGNOSIS — I10 Essential (primary) hypertension: Secondary | ICD-10-CM

## 2015-10-20 DIAGNOSIS — E781 Pure hyperglyceridemia: Secondary | ICD-10-CM | POA: Diagnosis not present

## 2015-10-20 DIAGNOSIS — IMO0001 Reserved for inherently not codable concepts without codable children: Secondary | ICD-10-CM

## 2015-10-20 DIAGNOSIS — E669 Obesity, unspecified: Secondary | ICD-10-CM | POA: Diagnosis not present

## 2015-10-20 DIAGNOSIS — M8580 Other specified disorders of bone density and structure, unspecified site: Secondary | ICD-10-CM | POA: Diagnosis not present

## 2015-10-20 LAB — LIPID PANEL
CHOL/HDL RATIO: 3.7 ratio (ref <=5.0)
CHOLESTEROL: 139 mg/dL (ref 125–200)
HDL: 38 mg/dL — AB (ref >=46)
LDL Cholesterol: 64 mg/dL (ref <130)
TRIGLYCERIDES: 183 mg/dL — AB (ref <150)
VLDL: 37 mg/dL — ABNORMAL HIGH (ref <30)

## 2015-10-20 LAB — CBC WITH DIFFERENTIAL/PLATELET
BASOS PCT: 1 %
Basophils Absolute: 79 cells/uL (ref 0–200)
EOS ABS: 237 {cells}/uL (ref 15–500)
Eosinophils Relative: 3 %
HEMATOCRIT: 41.2 % (ref 35.0–45.0)
HEMOGLOBIN: 13.7 g/dL (ref 11.7–15.5)
LYMPHS ABS: 1975 {cells}/uL (ref 850–3900)
LYMPHS PCT: 25 %
MCH: 27.7 pg (ref 27.0–33.0)
MCHC: 33.3 g/dL (ref 32.0–36.0)
MCV: 83.4 fL (ref 80.0–100.0)
MONO ABS: 790 {cells}/uL (ref 200–950)
MPV: 9.1 fL (ref 7.5–12.5)
Monocytes Relative: 10 %
Neutro Abs: 4819 cells/uL (ref 1500–7800)
Neutrophils Relative %: 61 %
Platelets: 311 10*3/uL (ref 140–400)
RBC: 4.94 MIL/uL (ref 3.80–5.10)
RDW: 14.4 % (ref 11.0–15.0)
WBC: 7.9 10*3/uL (ref 4.0–10.5)

## 2015-10-20 LAB — COMPREHENSIVE METABOLIC PANEL
ALBUMIN: 4.4 g/dL (ref 3.6–5.1)
ALK PHOS: 81 U/L (ref 33–130)
ALT: 13 U/L (ref 6–29)
AST: 18 U/L (ref 10–35)
BILIRUBIN TOTAL: 0.6 mg/dL (ref 0.2–1.2)
BUN: 10 mg/dL (ref 7–25)
CALCIUM: 9.5 mg/dL (ref 8.6–10.4)
CO2: 24 mmol/L (ref 20–31)
Chloride: 107 mmol/L (ref 98–110)
Creat: 0.76 mg/dL (ref 0.60–0.93)
Glucose, Bld: 138 mg/dL — ABNORMAL HIGH (ref 65–99)
POTASSIUM: 4.3 mmol/L (ref 3.5–5.3)
Sodium: 142 mmol/L (ref 135–146)
TOTAL PROTEIN: 7.4 g/dL (ref 6.1–8.1)

## 2015-10-20 LAB — VITAMIN B12: Vitamin B-12: 184 pg/mL — ABNORMAL LOW (ref 200–1100)

## 2015-10-20 LAB — FOLATE: Folate: 22 ng/mL (ref >5.4)

## 2015-10-20 NOTE — Telephone Encounter (Signed)
Pt states that she wants some clarification on her lovastatin. It states take 1 tablet 2 times daily. Is it okay to take 2 tablets at once or does she need to space it out.

## 2015-10-20 NOTE — Telephone Encounter (Signed)
Left word for word message on machine

## 2015-10-20 NOTE — Telephone Encounter (Signed)
She does not need to take this twice per day have her take 1 pill per day and we can check this again at her next appointment

## 2015-10-21 LAB — VITAMIN D 25 HYDROXY (VIT D DEFICIENCY, FRACTURES): Vit D, 25-Hydroxy: 27 ng/mL — ABNORMAL LOW (ref 30–100)

## 2015-10-29 ENCOUNTER — Ambulatory Visit (INDEPENDENT_AMBULATORY_CARE_PROVIDER_SITE_OTHER): Payer: Medicare HMO | Admitting: Family Medicine

## 2015-10-29 ENCOUNTER — Encounter: Payer: Self-pay | Admitting: Family Medicine

## 2015-10-29 VITALS — BP 138/82 | HR 78 | Wt 183.8 lb

## 2015-10-29 DIAGNOSIS — R7989 Other specified abnormal findings of blood chemistry: Secondary | ICD-10-CM | POA: Diagnosis not present

## 2015-10-29 DIAGNOSIS — E538 Deficiency of other specified B group vitamins: Secondary | ICD-10-CM

## 2015-10-29 DIAGNOSIS — K219 Gastro-esophageal reflux disease without esophagitis: Secondary | ICD-10-CM

## 2015-10-29 NOTE — Progress Notes (Signed)
   Subjective:    Patient ID: Jessica Hart, female    DOB: 03-06-1940, 75 y.o.   MRN: [id number]  HPI Chief Complaint  Patient presents with  . Other    follow-up on med. doing well on it   She is here to follow up on GERD symptoms. States she was having an"burning" sensation to her epigastric area that was worse when she had not eaten. Has been taking daily Dexilant for the past 10 days and states symptoms completely resolved.  Denies early satiety or eructation.  She is not taking anti- inflammatories or drinking alcohol. Is not a smoker.  Denies BRB in stool or back tarry stools.  Had a colonoscopy in 2010. Due in 2020.  Denies fever, chills, dizziness, chest pain, palpitations, DOE, abdominal pain, nausea, vomiting, diarrhea.   She is taking vitamin B12 due to low vitamin B12 level.   Reviewed allergies, medications, past medical, social history.    Review of Systems Pertinent positives and negatives in the history of present illness.     Objective:   Physical Exam BP 138/82   Pulse 78   Wt 183 lb 12.8 oz (83.4 kg)   BMI 31.55 kg/m  Alert and oriented and in no acute distress. Not otherwise examined.      Assessment & Plan:  Gastroesophageal reflux disease, esophagitis presence not specified  Low serum vitamin B12  Discussed that she appears to be doing well on the [place]. Symptom free now. She requests additional samples. #10 given. Advised that I recommend that after completing the 10 day course that she stop PPIs and try to control reflux with diet and lifestyle modifications. Discussed this again today. She may take tums, Maalox or Zantac as needed at that point. If she has breakthrough symptoms or requires daily medication then I will refer her to GI for further evaluation. Discussed short and long term side effects of daily PPIs.  Recommend she return for a AWV and to get her caught up on health maintenance such as DEXA.  She will follow up for Diabetes  in 4 months.  Advised to avoid alcohol,  She is taking daily vitamin B12, continue on this and repeat in 4 months at her Diabetes appointment.

## 2015-10-29 NOTE — Patient Instructions (Addendum)
Avoid alcohol and any NSAIDS such as ibuprofen, motrin, advil, aleve. Tylenol is ok.  Take the Dexilant for 10 more days and then stop the medication. You can use Zantac or Pepcid, mylanta or tums as needed after you stop the Dexilant.  Let me know in 1 month how you are doing with this.  Schedule a medicare annual wellness visit at your convenience.     Food Choices for Gastroesophageal Reflux Disease, Adult When you have gastroesophageal reflux disease (GERD), the foods you eat and your eating habits are very important. Choosing the right foods can help ease the discomfort of GERD. WHAT GENERAL GUIDELINES DO I NEED TO FOLLOW?  Choose fruits, vegetables, whole grains, low-fat dairy products, and low-fat meat, fish, and poultry.  Limit fats such as oils, salad dressings, butter, nuts, and avocado.  Keep a food diary to identify foods that cause symptoms.  Avoid foods that cause reflux. These may be different for different people.  Eat frequent small meals instead of three large meals each day.  Eat your meals slowly, in a relaxed setting.  Limit fried foods.  Cook foods using methods other than frying.  Avoid drinking alcohol.  Avoid drinking large amounts of liquids with your meals.  Avoid bending over or lying down until 2-3 hours after eating. WHAT FOODS ARE NOT RECOMMENDED? The following are some foods and drinks that may worsen your symptoms: Vegetables Tomatoes. Tomato juice. Tomato and spaghetti sauce. Chili peppers. Onion and garlic. Horseradish. Fruits Oranges, grapefruit, and lemon (fruit and juice). Meats High-fat meats, fish, and poultry. This includes hot dogs, ribs, ham, sausage, salami, and bacon. Dairy Whole milk and chocolate milk. Sour cream. Cream. Butter. Ice cream. Cream cheese.  Beverages Coffee and tea, with or without caffeine. Carbonated beverages or energy drinks. Condiments Hot sauce. Barbecue sauce.  Sweets/Desserts Chocolate and cocoa.  Donuts. Peppermint and spearmint. Fats and Oils High-fat foods, including [place] fries and potato chips. Other Vinegar. Strong spices, such as black pepper, white pepper, red pepper, cayenne, curry powder, cloves, ginger, and chili powder. The items listed above may not be a complete list of foods and beverages to avoid. Contact your dietitian for more information.   This information is not intended to replace advice given to you by your health care provider. Make sure you discuss any questions you have with your health care provider.   Document Released: 02/13/2005 Document Revised: 03/06/2014 Document Reviewed: 12/18/2012 Elsevier Interactive Patient Education [employer].

## 2015-11-11 ENCOUNTER — Telehealth: Payer: Self-pay

## 2015-11-11 NOTE — Telephone Encounter (Signed)
Pt has requested 90 day supply of Lovastatin called to [facility]. Please advise if ok to refill. It has not been prescribed by our office. Jessica Hart

## 2015-11-12 ENCOUNTER — Other Ambulatory Visit: Payer: Self-pay | Admitting: Medical

## 2015-11-12 MED ORDER — LOVASTATIN 20 MG PO TABS
20.0000 mg | ORAL_TABLET | Freq: Every day | ORAL | 0 refills | Status: DC
Start: 2015-11-12 — End: 2016-02-16

## 2015-11-12 NOTE — Telephone Encounter (Signed)
done

## 2015-11-26 ENCOUNTER — Other Ambulatory Visit: Payer: Self-pay | Admitting: Family Medicine

## 2015-12-20 ENCOUNTER — Encounter (HOSPITAL_COMMUNITY): Payer: Self-pay

## 2015-12-20 ENCOUNTER — Emergency Department (HOSPITAL_COMMUNITY)
Admission: EM | Admit: 2015-12-20 | Discharge: 2015-12-20 | Disposition: A | Discharge status: Home / Self Care | Payer: Medicare HMO | Attending: Emergency Medicine | Admitting: Emergency Medicine

## 2015-12-20 DIAGNOSIS — R040 Epistaxis: Secondary | ICD-10-CM | POA: Insufficient documentation

## 2015-12-20 DIAGNOSIS — E119 Type 2 diabetes mellitus without complications: Secondary | ICD-10-CM | POA: Insufficient documentation

## 2015-12-20 DIAGNOSIS — Z87891 Personal history of nicotine dependence: Secondary | ICD-10-CM | POA: Diagnosis not present

## 2015-12-20 DIAGNOSIS — I10 Essential (primary) hypertension: Secondary | ICD-10-CM | POA: Diagnosis not present

## 2015-12-20 DIAGNOSIS — Z7982 Long term (current) use of aspirin: Secondary | ICD-10-CM | POA: Insufficient documentation

## 2015-12-20 DIAGNOSIS — Z794 Long term (current) use of insulin: Secondary | ICD-10-CM | POA: Insufficient documentation

## 2015-12-20 DIAGNOSIS — Z79899 Other long term (current) drug therapy: Secondary | ICD-10-CM | POA: Diagnosis not present

## 2015-12-20 LAB — CBC
HEMATOCRIT: 42.4 % (ref 36.0–46.0)
HEMOGLOBIN: 14.1 g/dL (ref 12.0–15.0)
MCH: 27.6 pg (ref 26.0–34.0)
MCHC: 33.3 g/dL (ref 30.0–36.0)
MCV: 83.1 fL (ref 78.0–100.0)
Platelets: 298 10*3/uL (ref 150–400)
RBC: 5.1 MIL/uL (ref 3.87–5.11)
RDW: 14.1 % (ref 11.5–15.5)
WBC: 9.9 10*3/uL (ref 4.0–10.5)

## 2015-12-20 MED ORDER — OXYMETAZOLINE HCL 0.05 % NA SOLN
1.0000 | Freq: Once | NASAL | Status: AC
  Administered 2015-12-20: 1 via NASAL
  Filled 2015-12-20: qty 15

## 2015-12-20 NOTE — ED Notes (Signed)
Pt instructed on discharge paperwork. Pt states understanding of reason for return and follow up. Pt stable with steady gait.

## 2015-12-20 NOTE — ED Notes (Signed)
Pt's VS updated and awaiting discharge

## 2015-12-20 NOTE — ED Notes (Signed)
Pt. Has started to bleed from her nose again. Pt. Applied pressure around her nose.

## 2015-12-20 NOTE — ED Triage Notes (Signed)
Pt reports nose bleed this morning that started around 10, she was able to get it stopped. Pt reports it started bleeding again this afternoon. Pt denies use of blood thinners. No bleeding during triage. Pt does report some drainage.

## 2015-12-20 NOTE — ED Notes (Signed)
ENT cart outside of room

## 2015-12-20 NOTE — Discharge Instructions (Signed)
You may take afrin 2-5 sprays in your left nostril for nose bleed then place direct pressure as discussed.  You may consider 2 sprays twice per day for 2 days.  Do not use more than 3 days.

## 2015-12-21 NOTE — ED Provider Notes (Signed)
[facility] DEPT Provider Note   CSN: [id number] Arrival date & time: 12/20/15  1514     History   Chief Complaint Chief Complaint  Patient presents with  . Epistaxis    HPI Jessica Hart is a 75 y.o. female.  HPI  Left sided epistaxis began around 10AM, restarted again around noon and has been intermittent all day. Initlaly stopped prior to arrival then started in waiting room again. Received afrin and since then has not had bleeding for past 2 hours.  Does report nose picking as possible trigger. Not on anticoagulation. No trauma. Reports as holding pressure it began to come out of right side as well    Past Medical History:  Diagnosis Date  . Diabetes mellitus 02/27/2005  . Echocardiogram findings abnormal, without diagnosis 2005   EF 47%, no ischemia, no infarction  . History of bone density study 2006   Lt hip = -1.0, L spine = -1.8   . Hyperlipidemia   . Hypertension     Patient Active Problem List   Diagnosis Date Noted  . Osteopenia 10/19/2015  . 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 Vaginal itching 08/05/2014  . Foot callus 03/22/2013  . Urinary incontinence 03/22/2013  . 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 GERD (gastroesophageal reflux disease) 09/05/2011  . Atherosclerosis of other specified arteries 12/28/2009  . Uncontrolled diabetes mellitus type 2 without complications ([place]) 35/46/5681  . HYPERTRIGLYCERIDEMIA 04/26/2006  . Lipidemia 04/26/2006  . OBESITY, NOS 04/26/2006  . HYPERTENSION, BENIGN SYSTEMIC 04/26/2006  . ALOPECIA NOS, BALDNESS 04/26/2006  . OA (osteoarthritis) 04/26/2006  . Disorder of bone and cartilage 04/26/2006    Past Surgical History:  Procedure Laterality Date  . ABDOMINAL HYSTERECTOMY  1989   one ovary remains per patient  . TONSILECTOMY, ADENOIDECTOMY, BILATERAL MYRINGOTOMY AND TUBES  1965    OB History    No data available       Home Medications    Prior to Admission medications   Medication Sig Start Date End Date  Taking? Authorizing Provider  acetaminophen (TYLENOL) 500 MG tablet Take 2 tablets (1,000 mg total) by mouth every 8 (eight) hours as needed. Patient taking differently: Take 1,000 mg by mouth every 8 (eight) hours as needed for moderate pain.  04/06/14  Yes Elberta Leatherwood, MD  amLODipine (NORVASC) 10 MG tablet Take 1 tablet (10 mg total) by mouth daily. 06/18/15  Yes Girtha Rm, NP  aspirin 81 MG EC tablet Take 81 mg by mouth every morning.    Yes Historical Provider, MD  Calcium-Vitamin D 600-200 MG-UNIT per tablet Take 1 tablet by mouth 2 (two) times daily.    Yes Historical Provider, MD  Cholecalciferol (VITAMIN D PO) Take 1 tablet by mouth 2 (two) times daily.   Yes Historical Provider, MD  Insulin Degludec (TRESIBA FLEXTOUCH) 100 UNIT/ML SOPN Inject 10 Units into the skin daily after supper.   Yes Historical Provider, MD  linagliptin (TRADJENTA) 5 MG TABS tablet Take 1 tablet (5 mg total) by mouth daily. 06/18/15  Yes Girtha Rm, NP  losartan (COZAAR) 25 MG tablet Take 1 tablet (25 mg total) by mouth daily. 06/18/15  Yes Girtha Rm, NP  lovastatin (MEVACOR) 20 MG tablet Take 1 tablet (20 mg total) by mouth daily. Patient taking differently: Take 20 mg by mouth daily at 6 PM.  11/12/15  Yes Camelia Eng Tysinger, PA-C  metFORMIN (GLUCOPHAGE) 1000 MG tablet Take 1 tablet (1,000 mg total) by mouth 2 (two) times daily with  a meal. 06/18/15  Yes Vickie L Raenette Rover, NP  metoprolol (LOPRESSOR) 100 MG tablet TAKE ONE TABLET BY MOUTH TWICE DAILY 11/26/15  Yes Girtha Rm, NP  VITAMIN E PO Take 1 capsule by mouth 2 (two) times daily.   Yes Historical Provider, MD  glucose blood test strip 1 each by Other route daily. Use as instructed, test once daily 09/20/15   Rita Ohara, MD  Insulin Pen Needle (PEN NEEDLES 5/16") 30G X 8 MM MISC Pen needles used for lantus injections 05/04/15   Girtha Rm, NP    Family History Family History  Problem Relation Age of Onset  . Heart disease Father   . Cancer  Sister     Social History Social History  Substance Use Topics  . Smoking status: Former Smoker    Quit date: 07/27/2005  . Smokeless tobacco: Former Systems developer    Quit date: 04/06/2005  . Alcohol use Yes     Comment: beer occasionally      Allergies   Lisinopril   Review of Systems Review of Systems  Constitutional: Negative for fever.  HENT: Negative for congestion and sore throat.   Eyes: Negative for visual disturbance.  Respiratory: Negative for cough and shortness of breath.   Cardiovascular: Negative for chest pain.  Gastrointestinal: Negative for abdominal pain.  Genitourinary: Negative for difficulty urinating.  Neurological: Negative for syncope, light-headedness and headaches.     Physical Exam Updated Vital Signs BP 174/63 (BP Location: Right Arm)   Pulse 71   Temp 98 F (36.7 C)   Resp 18   Ht _0  (1.626 m)   Wt 182 lb (82.6 kg)   SpO2 97%   BMI 31.24 kg/m   Physical Exam  Constitutional: She is oriented to person, place, and time. She appears well-developed and well-nourished. No distress.  HENT:  Head: Normocephalic and atraumatic.  Nose: Epistaxis (signs of prior bleeding left nostril kiesselbach's) is observed.  Eyes: Conjunctivae and EOM are normal.  Neck: Normal range of motion.  Cardiovascular: Normal rate, regular rhythm, normal heart sounds and intact distal pulses.  Exam reveals no gallop and no friction rub.   No murmur heard. Pulmonary/Chest: Effort normal and breath sounds normal. No respiratory distress. She has no wheezes. She has no rales.  Abdominal: Soft. She exhibits no distension. There is no tenderness. There is no guarding.  Musculoskeletal: She exhibits no edema or tenderness.  Neurological: She is alert and oriented to person, place, and time.  Skin: Skin is warm and dry. No rash noted. She is not diaphoretic. No erythema.  Nursing note and vitals reviewed.    ED Treatments / Results  Labs (all labs ordered are listed, but  only abnormal results are displayed) Labs Reviewed  CBC    EKG  EKG Interpretation None       Radiology No results found.  Procedures Procedures (including critical care time)  Medications Ordered in ED Medications  oxymetazoline (AFRIN) 0.05 % nasal spray 1 spray (1 spray Each Nare Given 12/20/15 1841)     Initial Impression / Assessment and Plan / ED Course  I have reviewed the triage vital signs and the nursing notes.  Pertinent labs & imaging results that were available during my care of the patient were reviewed by me and considered in my medical decision making (see chart for details).  Clinical Course   75yo female presents with epistaxis. Not on anticoagulation. Epistaxis stopped after afrin and pressure prior to my arrival. CBC  WNL.  Discussed epistaxis management. Patient discharged in stable condition with understanding of reasons to return.   Final Clinical Impressions(s) / ED Diagnoses   Final diagnoses:  Epistaxis    New Prescriptions Discharge Medication List as of 12/20/2015  8:51 PM       Gareth Morgan, MD 12/21/15 2125

## 2015-12-29 ENCOUNTER — Encounter: Payer: Self-pay | Admitting: Family Medicine

## 2015-12-29 ENCOUNTER — Ambulatory Visit (INDEPENDENT_AMBULATORY_CARE_PROVIDER_SITE_OTHER): Payer: Medicare HMO | Admitting: Family Medicine

## 2015-12-29 VITALS — BP 142/80 | HR 82 | Ht 64.0 in | Wt 182.2 lb

## 2015-12-29 DIAGNOSIS — I10 Essential (primary) hypertension: Secondary | ICD-10-CM

## 2015-12-29 DIAGNOSIS — E538 Deficiency of other specified B group vitamins: Secondary | ICD-10-CM | POA: Diagnosis not present

## 2015-12-29 DIAGNOSIS — Z0000 Encounter for general adult medical examination without abnormal findings: Secondary | ICD-10-CM | POA: Diagnosis not present

## 2015-12-29 DIAGNOSIS — A084 Viral intestinal infection, unspecified: Secondary | ICD-10-CM | POA: Diagnosis not present

## 2015-12-29 DIAGNOSIS — E559 Vitamin D deficiency, unspecified: Secondary | ICD-10-CM

## 2015-12-29 DIAGNOSIS — E1165 Type 2 diabetes mellitus with hyperglycemia: Secondary | ICD-10-CM

## 2015-12-29 DIAGNOSIS — M8580 Other specified disorders of bone density and structure, unspecified site: Secondary | ICD-10-CM | POA: Diagnosis not present

## 2015-12-29 DIAGNOSIS — Z1231 Encounter for screening mammogram for malignant neoplasm of breast: Secondary | ICD-10-CM

## 2015-12-29 DIAGNOSIS — IMO0001 Reserved for inherently not codable concepts without codable children: Secondary | ICD-10-CM

## 2015-12-29 DIAGNOSIS — Z1239 Encounter for other screening for malignant neoplasm of breast: Secondary | ICD-10-CM

## 2015-12-29 DIAGNOSIS — E2839 Other primary ovarian failure: Secondary | ICD-10-CM

## 2015-12-29 LAB — CBC WITH DIFFERENTIAL/PLATELET
BASOS PCT: 0 %
Basophils Absolute: 0 cells/uL (ref 0–200)
EOS PCT: 1 %
Eosinophils Absolute: 164 cells/uL (ref 15–500)
HEMATOCRIT: 43.8 % (ref 35.0–45.0)
Hemoglobin: 14.7 g/dL (ref 11.7–15.5)
LYMPHS ABS: 2132 {cells}/uL (ref 850–3900)
LYMPHS PCT: 13 %
MCH: 28 pg (ref 27.0–33.0)
MCHC: 33.6 g/dL (ref 32.0–36.0)
MCV: 83.4 fL (ref 80.0–100.0)
MONO ABS: 984 {cells}/uL — AB (ref 200–950)
MPV: 8.8 fL (ref 7.5–12.5)
Monocytes Relative: 6 %
Neutro Abs: 13120 cells/uL — ABNORMAL HIGH (ref 1500–7800)
Neutrophils Relative %: 80 %
Platelets: 347 10*3/uL (ref 140–400)
RBC: 5.25 MIL/uL — AB (ref 3.80–5.10)
RDW: 14.7 % (ref 11.0–15.0)
WBC: 16.4 10*3/uL — AB (ref 4.0–10.5)

## 2015-12-29 LAB — COMPREHENSIVE METABOLIC PANEL
ALK PHOS: 103 U/L (ref 33–130)
ALT: 22 U/L (ref 6–29)
AST: 33 U/L (ref 10–35)
Albumin: 5 g/dL (ref 3.6–5.1)
BILIRUBIN TOTAL: 0.7 mg/dL (ref 0.2–1.2)
BUN: 10 mg/dL (ref 7–25)
CALCIUM: 10.5 mg/dL — AB (ref 8.6–10.4)
CO2: 25 mmol/L (ref 20–31)
CREATININE: 0.9 mg/dL (ref 0.60–0.93)
Chloride: 103 mmol/L (ref 98–110)
GLUCOSE: 184 mg/dL — AB (ref 65–99)
Potassium: 3.8 mmol/L (ref 3.5–5.3)
SODIUM: 141 mmol/L (ref 135–146)
Total Protein: 8.4 g/dL — ABNORMAL HIGH (ref 6.1–8.1)

## 2015-12-29 LAB — POCT URINALYSIS DIPSTICK
Blood, UA: NEGATIVE
Glucose, UA: NEGATIVE
KETONES UA: NEGATIVE
NITRITE UA: NEGATIVE
PH UA: 6
Spec Grav, UA: >=1.03
Urobilinogen, UA: NEGATIVE

## 2015-12-29 LAB — VITAMIN B12: Vitamin B-12: 666 pg/mL (ref 200–1100)

## 2015-12-29 LAB — TSH: TSH: 1.71 mIU/L

## 2015-12-29 NOTE — Progress Notes (Signed)
Jessica Hart is a 75 y.o. female who presents for annual wellness visit and follow-up on chronic medical conditions.  She has the following concerns:  She has been taking Metformin for 10 years and has had soft bowels almost daily. She has been taking her daily diabetes medicines however she reports she has not been exercising. States she is having higher readings than usual. This morning her blood sugar was 180 fasting. States yesterday her blood sugar was 125 fasting. She would like to discuss changing medications for diabetes, particularly the metformin. Her last A1c 10/19/2015 was 7.4%.  She reports that she felt fine yesterday but then at 3 AM she woke up with nausea, vomiting and diarrhea. She has had 4 episodes of diarrhea and has vomited twice.    Immunization History  Administered Date(s) Administered  . Influenza Whole 12/11/2007  . Influenza,inj,Quad PF,36+ Mos 10/29/2012, 04/06/2014, 02/19/2015  . Pneumococcal Conjugate-13 04/06/2014  . Pneumococcal Polysaccharide-23 12/11/2007  . Td 05/28/2004   Last Pap smear: had hysterectomy Last mammogram: couple years ago Last colonoscopy: about time for another one Last DEXA: a few years ago Dentist: no dentist Ophtho: had cataracts in eyes and had surgery- 2 years ago. She has been to [facility] for this.  Exercise:none  Other doctors caring for patient include: none   Depression screen:  See questionnaire below.  Depression screen [facility] 2/9 12/29/2015 05/13/2015 01/11/2015 08/05/2014 04/06/2014  Decreased Interest 0 0 0 0 0  Down, Depressed, Hopeless 0 0 0 0 0  PHQ - 2 Score 0 0 0 0 0    Fall Risk Screen: see questionnaire below. Fall Risk  12/29/2015 05/13/2015 01/11/2015 08/05/2014 04/06/2014  Falls in the past year? _0     ADL screen:  See questionnaire below Functional Status Survey:    End of Life Discussion:  Patient does not have a living will and medical power of attorney. Documents given to patient with  counseling on how to fill these out and to return them to our office after doing so.   Review of Systems Constitutional: -fever, -chills, -sweats, -unexpected weight change, -anorexia, -fatigue Allergy: -sneezing, -itching, -congestion Dermatology: denies changing moles, rash, lumps, new worrisome lesions ENT: -runny nose, -ear pain, -sore throat, -hoarseness, -sinus pain, -teeth pain, -tinnitus, -hearing loss, -epistaxis Cardiology:  -chest pain, -palpitations, -edema, -orthopnea, -paroxysmal nocturnal dyspnea Respiratory: -cough, -shortness of breath, -dyspnea on exertion, -wheezing, -hemoptysis Gastroenterology: -abdominal pain, -nausea, + 2 episodes of vomiting, +4 episodes of diarrhea, -constipation, -blood in stool, -changes in bowel movement, -dysphagia Hematology: -bleeding or bruising problems Musculoskeletal: -arthralgias, -myalgias, -joint swelling, -back pain, -neck pain, -cramping, -gait changes Ophthalmology: -vision changes, -eye redness, -itching, -discharge Urology: -dysuria, -difficulty urinating, -hematuria, -urinary frequency, -urgency, incontinence Neurology: -headache, -weakness, -tingling, -numbness, -speech abnormality, -memory loss, -falls, -dizziness Psychology:  -depressed mood, -agitation, -sleep problems   PHYSICAL EXAM:  BP (!) 142/80   Pulse 82   Ht _1  (1.626 m)   Wt 182 lb 3.2 oz (82.6 kg)   BMI 31.27 kg/m   General Appearance: Alert, cooperative, no distress, appears stated age Head: Normocephalic, without obvious abnormality, atraumatic Eyes: PERRL, conjunctiva/corneas clear, EOM's intact, fundi benign Ears: Normal TM's and external ear canals Nose: Nares normal, mucosa normal, no drainage or sinus   tenderness Throat: Lips, mucosa, and tongue normal; teeth and gums normal Neck: Supple, no lymphadenopathy; thyroid: no enlargement/tenderness/nodules; no carotid bruit or JVD Back: Spine nontender, no curvature, ROM normal, no CVA tenderness Lungs:  Clear to auscultation bilaterally without wheezes, rales or ronchi; respirations unlabored Chest Wall: No tenderness or deformity Heart: Regular rate and rhythm, S1 and S2 normal, no murmur, rub or gallop Breast Exam: No tenderness, masses, or nipple discharge or inversion. No axillary lymphadenopathy Abdomen: Soft, non-tender, nondistended, normoactive bowel sounds, no masses, no hepatosplenomegaly Genitalia: Normal external genitalia without lesions.   Rectal: Normal tone, no masses or tenderness; guaiac negative stool Extremities: No clubbing, cyanosis or edema Pulses: 2+ and symmetric all extremities Skin: Skin color, texture, turgor normal, no rashes or lesions Lymph nodes: Cervical, supraclavicular, and axillary nodes normal Neurologic: CNII-XII intact, normal strength, sensation and gait; reflexes 2+ and symmetric throughout Psych: Normal mood, affect, hygiene and grooming.  Urinalysis dipstick: 2+ protein, bil 1+  ASSESSMENT/PLAN: Medicare annual wellness visit, subsequent  HYPERTENSION, BENIGN SYSTEMIC - Plan: CBC with Differential/Platelet, Comprehensive metabolic panel  Osteopenia, unspecified location - Plan: DG Bone Density  Uncontrolled type 2 diabetes mellitus without complication, without long-term current use of insulin (HCC) - Plan: CBC with Differential/Platelet, Comprehensive metabolic panel, TSH  Vitamin D deficiency - Plan: VITAMIN D 25 Hydroxy (Vit-D Deficiency, Fractures), DG Bone Density  Estrogen deficiency - Plan: VITAMIN D 25 Hydroxy (Vit-D Deficiency, Fractures), DG Bone Density  Vitamin B12 deficiency - Plan: Vitamin B12  Breast cancer screening - Plan: MM DIGITAL SCREENING BILATERAL  Routine general medical examination at a health care facility - Plan: CBC with Differential/Platelet, Comprehensive metabolic panel, POCT urinalysis dipstick  Viral gastroenteritis  Suspect that her vomiting and diarrhea is related to viral etiology and recommend she  stay well hydrated and eating a bland diet for the next 24 hours. She will let me know if her symptoms are not resolving or if she worsens with fever or, pain. We will reconsider her diabetes medications, she would like to consider stopping metformin and switching to a different medication. I do not think she is a good candidate for an SGLT2 medication based on her history of vaginal itching and yeast infections.  Order for mammogram and bone density in the chart. She will call and schedule these. Recommend that she schedule an eye exam and her dental exam. She does have a history of osteopenia per the chart and is not currently taking vitamin D or calcium. Discussed recommendations with her. Will check vitamin D level today. History of vitamin B12 deficiency and we will recheck this today.  Discussed monthly self breast exams and yearly mammograms; at least 30 minutes of aerobic activity at least 5 days/week and weight-bearing exercise 2x/week; proper sunscreen use reviewed; healthy diet, including goals of calcium and vitamin D intake and alcohol recommendations (less than or equal to 1 drink/day) reviewed; regular seatbelt use; changing batteries in smoke detectors.  Immunization recommendations discussed and prescription given for her to take to her pharmacy for Zostavax and tdap.  Colonoscopy recommendations reviewed and she will be due again in 2020.    Medicare Attestation I have personally reviewed: The patient's medical and social history Their use of alcohol, tobacco or illicit drugs Their current medications and supplements The patient's functional ability including ADLs,fall risks, home safety risks, cognitive, and hearing and visual impairment Diet and physical activities Evidence for depression or mood disorders  The patient's weight, height, and BMI have been recorded in the chart.  I have made referrals, counseling, and provided education to the patient based on review of the above  and I have provided the patient with a written personalized care plan for preventive services.  Harland Dingwall, NP   12/29/2015

## 2015-12-29 NOTE — Patient Instructions (Addendum)
MEDICARE PREVENTATIVE SERVICES (FEMALE) AND PERSONALIZED PLAN for [facility] December 29, 2015  CONDITIONS OR RISKS IDENTIFIED TODAY: Call and schedule your diabetic eye exam. Call and schedule an appointment with a dentist for a checkup. Call and schedule your mammogram and bone density.   Look over the advanced directives and if you have any questions you can schedule an appointment and we can help you with this. If you decide to fill them out return them so we can make a copy.  SPECIFIC RECOMMENDATIONS: Vitamin D 1,000 IU per day. Make sure you're getting at least 1,200 mg of calcium (take a 600 mg supplement since you do not drink milk).  Weight bearing exercises for bone health.   Influenza vaccine: she will return for this.  Pneumococcal vaccine: has had both Shingles vaccine: zoster Tdap vaccine: is due and will give a prescription to take to her pharmacy. Colonoscopy: 2010- due again in 2020 Mammogram: at least 2 years ago. Will order this and you can call and schedule the test.  Pelvic exam: 2017  Pap smear: N/A hysterectomy   Aspirin 73m nightly for heart health Make sure you are doing weightbearing exercises. Try to get at least 150 minutes per week of some sort of physical activity. Try to eat a healthy diet with vegetables and fiber. Watch your sugar and carbohydrate and starch intake. Also watch her sodium and fat intake  Return in 4 weeks for a diabetes check.    GENERAL RECOMMENDATIONS FOR GOOD HEALTH:  Supplements:  . Take a daily baby Aspirin 844mat bedtime for heart health unless you have a history of gastrointestinal bleed, allergy to aspirin, or are already taking higher dose Aspirin or other antiplatelet or blood thinner medication.   . Consume 1200 mg of Calcium daily through dietary calcium or supplement if you are female age 73r older, or men 712nd older.   Men aged 73-73hould consume 1000 mg of Calcium daily. . Take 600 IU of Vitamin D daily.   Take 800 IU of Calcium daily if you are older than age 73 . Take a general multivitamin daily.   Healthy diet: Eat a variety of foods, including fruits, vegetables, vegetable protein such as beans, lentils, tofu, and grains, such as rice.  Limit meat or animal protein, but if you eat meat, choose leans cuts such as chicken, fish, or [place] Drink plenty of water daily.  Decrease saturated fat in the diet, avoid lots of red meat, processed foods, sweets, fast foods, and fried foods.  Limit salt and caffeine intake.  Exercise: Aerobic exercise helps maintain good heart health. Weight bearing exercise helps keep bones and muscles working strong.  We recommend at least 30-40 minutes of exercise most days of the week.   Fall prevention: Falls are the leading cause of injuries, accidents, and accidental deaths in people over the age of 7326Falling is a real threat to your ability to live on your own.  Causes include poor eyesight or poor hearing, illness, poor lighting, throw rugs, clutter in your home, and medication side effects causing dizziness or balance problems.  Such medications can include medications for depression, sleep problems, high blood pressure, diabetes, and heart conditions.   PREVENTION  Be sure your home is as safe as possible. Here are some tips:  Wear shoes with non-skid soles (not house slippers).   Be sure your home and outside area are well lit.   Use night lights throughout your house,  including hallways and stairways.   Remove clutter and clean up spills on floors and walkways.   Remove throw rugs or fasten them to the floor with carpet tape. Tack down carpet edges.   Do not place electrical cords across pathways.   Install grab bars in your bathtub, shower, and toilet area. Towel bars should not be used as a grab bar.   Install handrails on both sides of stairways.   Do not climb on stools or stepladders. Get someone else to help with jobs that require  climbing.   Do not wax your floors at all, or use a non-skid wax.   Repair uneven or unsafe sidewalks, walkways or stairs.   Keep frequently used items within reach.   Be aware of pets so you do not trip.  Get regular check-ups from your doctor, and take good care of yourself:  Have your eyes checked every year for vision changes, cataracts, glaucoma, and other eye problems. Wear eyeglasses as directed.   Have your hearing checked every 2 years, or anytime you or others think that you cannot hear well. Use hearing aids as directed.   See your caregiver if you have foot pain or corns. Sore feet can contribute to falls.   Let your caregiver know if a medicine is making you feel dizzy or making you lose your balance.   Use a cane, walker, or wheelchair as directed. Use walker or wheelchair brakes when getting in and out.   When you get up from bed, sit on the side of the bed for 1 to 2 minutes before you stand up. This will give your blood pressure time to adjust, and you will feel less dizzy.   If you need to go to the bathroom often, consider using a bedside commode.  Disease prevention:  If you smoke or chew tobacco, find out from your caregiver how to quit. It can literally save your life, no matter how long you have been a tobacco user. If you do not use tobacco, never begin. Medicare does cover some smoking cessation counseling.  Maintain a healthy diet and normal weight. Increased weight leads to problems with blood pressure and diabetes. We check your height, weight, and BMI as part of your yearly visit.  The Body Mass Index or BMI is a way of measuring how much of your body is fat. Having a BMI above 27 increases the risk of heart disease, diabetes, hypertension, stroke and other problems related to obesity. Your caregiver can help determine your BMI and based on it develop an exercise and dietary program to help you achieve or maintain this important measurement at a healthful  level.  High blood pressure causes heart and blood vessel problems.  Persistent high blood pressure should be treated with medicine if weight loss and exercise do not work.  We check your blood pressure as part of your yearly visit.  Avoid drinking alcohol in excess (more than two drinks per day).  Avoid use of street drugs. Do not share needles with anyone. Ask for professional help if you need assistance or instructions on stopping the use of alcohol, cigarettes, and/or drugs.  Brush your teeth twice a day with fluoride toothpaste, and floss once a day. Good oral hygiene prevents tooth decay and gum disease. The problems can be painful, unattractive, and can cause other health problems. Visit your dentist for a routine oral and dental checkup and preventive care every 6-12 months.   See your eye doctor yearly  for routine screening for things like glaucoma.  Look at your skin regularly.  Use a mirror to look at your back. Notify your caregivers of changes in moles, especially if there are changes in shapes, colors, a size larger than a pencil eraser, an irregular border, or development of new moles.  Safety:  Use seatbelts 100% of the time, whether driving or as a passenger.  Use safety devices such as hearing protection if you work in environments with loud noise or significant background noise.  Use safety glasses when doing any work that could send debris in to the eyes.  Use a helmet if you ride a bike or motorcycle.  Use appropriate safety gear for contact sports.  Talk to your caregiver about gun safety.  Use sunscreen with a SPF (or skin protection factor) of 15 or greater.  Lighter skinned people are at a greater risk of skin cancer. Don't forget to also wear sunglasses in order to protect your eyes from too much damaging sunlight. Damaging sunlight can accelerate cataract formation.   If you have multiple sexual partners, or if you are not in a monogamous relationship, practice safe sex.  Use condoms. Condoms are used to help reduce the spread of sexually transmitted infections (or STIs).  Consider an HIV test if you have never been tested.  Consider routine screening for STIs if you have multiple sexual partners.   Keep carbon monoxide and smoke detectors in your home functioning at all times. Change the batteries every 6 months or use a model that plugs into the wall or is hard wired in.   END OF LIFE PLANNING/ADVANCED DIRECTIVES Advance health-care planning is deciding the kind of care you want at the end of life. While alert competent adults are able to exercise their rights to make health care and financial decisions, problems arise when an individual becomes unconscious, incapacitated, or otherwise unable to communicate or make such decisions. Advance health care directives are the legal documents in which you give written instructions about your choices limited, aggressive or palliative care if, in the future, you cannot speak for yourself.  Advanced directives include the following: [place] allows you to appoint someone to act as your health care agent to make health care decisions for you should it be determined by your health care provider that you are no longer able to make these decisions for yourself.  A Living Will is a legal document in which you can declare that under certain conditions you desire your life not be prolonged by extraordinary or artificial means during your last illness or when you are near death. We can provide you with sample advanced directives, you can get an attorney to prepare these for you, or you can visit [place] Secretary of State's website for additional information and resources at [email or web address]  Further, I recommend you have an attorney prepare a Will and Durable Power of Attorney if you haven't done so already.  Please get [place] a copy of your health care Advanced Directives.   PREVENTATIV E CARE  RECOMMENDATIONS:  Vaccinations: We recommend the following vaccinations as part of your preventative care:  Pneumococcal vaccine is recommended to protect against certain types of pneumonia.  This is normally recommended for adults age 34 or older once, or up to every 5 years for those at high risk.  The vaccine is also recommended for adults younger than 73 years old with certain underlying conditions that make them high risk for pneumonia.  Influenza vaccine is recommended to protect against seasonal influenza or "the flu." Influenza is a serious disease that can lead to hospitalization and sometimes even death. Traditional flu vaccines (called trivalent vaccines) are made to protect against three flu viruses; an influenza A (H1N1) virus, an influenza A (H3N2) virus, and an influenza B virus. In addition, there are flu vaccines made to protect against four flu viruses (called "quadrivalent" vaccines). These vaccines protect against the same viruses as the trivalent vaccine and an additional B virus.  We recommend the high dose influenza vaccine to those 65 years and older.  Hepatitis B vaccine to protect against a form of infection of the liver by a virus acquired from blood or body fluids, particularly for high risk groups.  Td or Tdap vaccine to protect against Tetanus, diphtheria and pertussis which can be very serious.  These diseases are caused by bacteria.  Diphtheria and pertussis are spread from person to person through coughing or sneezing.  Tetanus enters the body through cuts, scratches, or wounds.  Tetanus (Lockjaw) causes painful muscle tightening and stiffness, usually all over the body.  Diphtheria can cause a thick coating to form in the back of the throat.  It can lead to breathing problems, paralysis, heart failure, and death.  Pertussis (Whooping Cough) causes severe coughing spells, which can cause difficulty breathing, vomiting and disturbed sleep.  Td or Tdap is usually given every  10 years.  Shingles vaccine to protect against Varicella Zoster if you are older than age 88, or younger than 73 years old with certain underlying illness.    Cancer Screening: Most routine colon cancer screening begins at the age of 60.  Subsequent colonoscopies are performed either every 5-10 years for normal screening, or every 2-5 years for higher risks patients, up until age 76 years of age. Annual screening is done with easy to use take-home tests to check for hidden blood in the stool called hemoccult tests.  Sigmoidoscopy or colonoscopy can detect the earliest forms of colon cancer and is life saving. These tests use a small camera at the end of a tube to directly examine the colon.   Pelvic Exam and Pap Smear: Pelvic exams and pap smears are performed routinely to evaluate for abnormalities as well as cancers including cervical and vaginal cancers.  This is generally performed every 2-3 years for most women, or more frequently for higher risk patients.  Mammograms: Mammograms are used to screen for breast cancer.  Medicare covers baseline screening once from ages 40-91 years old, but will cover mammograms yearly for those 40 years and older.  In accordance with other guidelines, you may not need a mammogram every year though.  The decision on how frequently you need a mammogram should be discussed with you medical provider.    Osteoporosis Screening: Screening for osteoporosis usually begins at age 72 for women, and can be done as frequent as every 2 years.  However, women or men with higher risk of osteoporosis may be screened earlier than age 85.  Osteoporosis or low bone mass is diminished bone strength from alterations in bone architecture leading to bone fragility and increased fracture risk.     Cardiovascular Screening: Fat and cholesterol leaves deposits in your arteries that can block them. This causes heart disease and vessel disease elsewhere in your body.  If your cholesterol  is found to be high, or if you have heart disease or certain other medical conditions, then you may need to have your  cholesterol monitored frequently and be treated with medication. Cardiovascular screening in the form of lab tests for cholesterol, HDL and triglycerides can be done every 5 years.  A screening electrocardiogram can be done as part of the Welcome to Medicare physical.  Diabetes Screening: Diabetes screening can be done at least every 3 years for those with risk factors,  or every 6-35month for prediabetic patients.  Screening includes fasting blood sugar test or glucose tolerance test.  Risk factors include hypertension, dyslipidemia, obesity, previously abnormal glucose tests, family history of diabetes, age 38344years or older, and history of gestations diabetes.   AAA (abdominal aortic aneurysm) Screening: Medicare allows for a one time ultrasound to screen for abdominal aortic aneurysm if done as a referral as part of the Welcome to Medicare exam.  Men eligible for this screening include those men between age [id number] of age who have smoked at least 100 cigarettes in his lifetime and/or has a family history of AAA.  HIV Screening:  Medicare allows for yearly screening for patients at high risk for contracting HIV disease.

## 2015-12-30 ENCOUNTER — Other Ambulatory Visit: Payer: Self-pay | Admitting: Family Medicine

## 2015-12-30 DIAGNOSIS — R69 Illness, unspecified: Secondary | ICD-10-CM | POA: Diagnosis not present

## 2015-12-30 LAB — VITAMIN D 25 HYDROXY (VIT D DEFICIENCY, FRACTURES): VIT D 25 HYDROXY: 37 ng/mL (ref 30–100)

## 2015-12-30 MED ORDER — ONETOUCH DELICA LANCETS FINE MISC: 0 refills | Status: DC

## 2015-12-30 MED ORDER — GLUCOSE BLOOD VI STRP: ORAL_STRIP | 0 refills | Status: DC

## 2015-12-31 DIAGNOSIS — R69 Illness, unspecified: Secondary | ICD-10-CM | POA: Diagnosis not present

## 2016-01-06 ENCOUNTER — Ambulatory Visit (INDEPENDENT_AMBULATORY_CARE_PROVIDER_SITE_OTHER): Payer: Medicare HMO | Admitting: Family Medicine

## 2016-01-06 ENCOUNTER — Encounter: Payer: Self-pay | Admitting: Family Medicine

## 2016-01-06 VITALS — BP 120/68 | HR 71 | Wt 181.2 lb

## 2016-01-06 DIAGNOSIS — D72829 Elevated white blood cell count, unspecified: Secondary | ICD-10-CM

## 2016-01-06 DIAGNOSIS — R779 Abnormality of plasma protein, unspecified: Secondary | ICD-10-CM | POA: Diagnosis not present

## 2016-01-06 DIAGNOSIS — R809 Proteinuria, unspecified: Secondary | ICD-10-CM | POA: Diagnosis not present

## 2016-01-06 LAB — CBC WITH DIFFERENTIAL/PLATELET
BASOS ABS: 94 {cells}/uL (ref 0–200)
Basophils Relative: 1 %
EOS ABS: 94 {cells}/uL (ref 15–500)
Eosinophils Relative: 1 %
HEMATOCRIT: 38.7 % (ref 35.0–45.0)
Hemoglobin: 12.7 g/dL (ref 11.7–15.5)
LYMPHS PCT: 16 %
Lymphs Abs: 1504 cells/uL (ref 850–3900)
MCH: 27.5 pg (ref 27.0–33.0)
MCHC: 32.8 g/dL (ref 32.0–36.0)
MCV: 83.8 fL (ref 80.0–100.0)
MONO ABS: 752 {cells}/uL (ref 200–950)
MONOS PCT: 8 %
MPV: 9 fL (ref 7.5–12.5)
NEUTROS ABS: 6956 {cells}/uL (ref 1500–7800)
Neutrophils Relative %: 74 %
PLATELETS: 344 10*3/uL (ref 140–400)
RBC: 4.62 MIL/uL (ref 3.80–5.10)
RDW: 14.9 % (ref 11.0–15.0)
WBC: 9.4 10*3/uL (ref 4.0–10.5)

## 2016-01-06 LAB — POCT URINALYSIS DIPSTICK
Bilirubin, UA: NEGATIVE
Glucose, UA: NEGATIVE
RBC UA: NEGATIVE
Urobilinogen, UA: NEGATIVE
pH, UA: 6

## 2016-01-06 LAB — BASIC METABOLIC PANEL
BUN: 8 mg/dL (ref 7–25)
CALCIUM: 9 mg/dL (ref 8.6–10.4)
CO2: 22 mmol/L (ref 20–31)
Chloride: 108 mmol/L (ref 98–110)
Creat: 0.88 mg/dL (ref 0.60–0.93)
Glucose, Bld: 201 mg/dL — ABNORMAL HIGH (ref 65–99)
POTASSIUM: 3.5 mmol/L (ref 3.5–5.3)
SODIUM: 140 mmol/L (ref 135–146)

## 2016-01-06 NOTE — Progress Notes (Signed)
   Subjective:    Patient ID: Jessica Hart, female    DOB: Dec 05, 1940, 75 y.o.   MRN: [id number]  HPI Chief Complaint  Patient presents with  . follow-up    follow-up on blood count   She is here for follow on abnormal labs. She had an elevated WBC at her last visit. She was also having complaints of N/V/D and suspected viral gastroenteritis. She reports symptoms resolved and she is now back to baseline.  She also had elevated serum protein and Proteinuria. We are following up on this as well.  Her blood sugar was 110 this morning. She has cut back on meat and sugar. Last A1C 7.4% in 10/19/2015  Denies fever, chills, chest pain, palpitations, shortness of breath, cough, abdominal pain, N/V/D.     Review of Systems Pertinent positives and negatives in the history of present illness.     Objective:   Physical Exam BP 120/68   Pulse 71   Wt 181 lb 3.2 oz (82.2 kg)   BMI 31.10 kg/m  Alert and oriented and in no acute distress. Not otherwise examined.  Urinalysis dipstick: trace protein, leuk 2+     Assessment & Plan:  Leukocytosis, unspecified type - Plan: CBC with Differential/Platelet  Proteinuria, unspecified type - Plan: POCT urinalysis dipstick  Elevated serum protein level - Plan: Basic metabolic panel  Discussed that she appears to be doing well and is now back at her baseline. Suspect her elevated WBC count was related to her GI illness. Will repeat this today.  She will follow up for diabetes check as scheduled.  Urinalysis dipstick improved.

## 2016-02-01 ENCOUNTER — Other Ambulatory Visit: Payer: Self-pay | Admitting: Family Medicine

## 2016-02-16 ENCOUNTER — Other Ambulatory Visit: Payer: Self-pay | Admitting: Medical

## 2016-02-16 DIAGNOSIS — R69 Illness, unspecified: Secondary | ICD-10-CM | POA: Diagnosis not present

## 2016-02-29 ENCOUNTER — Telehealth: Payer: Self-pay | Admitting: Internal Medicine

## 2016-02-29 ENCOUNTER — Ambulatory Visit (INDEPENDENT_AMBULATORY_CARE_PROVIDER_SITE_OTHER): Payer: Medicare HMO | Admitting: Family Medicine

## 2016-02-29 ENCOUNTER — Encounter: Payer: Self-pay | Admitting: Family Medicine

## 2016-02-29 VITALS — BP 128/80 | HR 72 | Wt 182.4 lb

## 2016-02-29 DIAGNOSIS — E119 Type 2 diabetes mellitus without complications: Secondary | ICD-10-CM | POA: Diagnosis not present

## 2016-02-29 DIAGNOSIS — E1169 Type 2 diabetes mellitus with other specified complication: Secondary | ICD-10-CM

## 2016-02-29 DIAGNOSIS — Z23 Encounter for immunization: Secondary | ICD-10-CM

## 2016-02-29 DIAGNOSIS — I10 Essential (primary) hypertension: Secondary | ICD-10-CM

## 2016-02-29 DIAGNOSIS — E785 Hyperlipidemia, unspecified: Secondary | ICD-10-CM

## 2016-02-29 DIAGNOSIS — E669 Obesity, unspecified: Secondary | ICD-10-CM | POA: Diagnosis not present

## 2016-02-29 LAB — POCT GLYCOSYLATED HEMOGLOBIN (HGB A1C)

## 2016-02-29 NOTE — Patient Instructions (Signed)
Carbohydrate Counting for Diabetes Mellitus, Adult Carbohydrate counting is a method for keeping track of how many carbohydrates you eat. Eating carbohydrates naturally increases the amount of sugar (glucose) in the blood. Counting how many carbohydrates you eat helps keep your blood glucose within normal limits, which helps you manage your diabetes (diabetes mellitus). It is important to know how many carbohydrates you can safely have in each meal. This is different for every person. A diet and nutrition specialist (registered dietitian) can help you make a meal plan and calculate how many carbohydrates you should have at each meal and snack. Carbohydrates are found in the following foods:  Grains, such as breads and cereals.  Dried beans and soy products.  Starchy vegetables, such as potatoes, peas, and corn.  Fruit and fruit juices.  Milk and yogurt.  Sweets and snack foods, such as cake, cookies, candy, chips, and soft drinks. How do I count carbohydrates? There are two ways to count carbohydrates in food. You can use either of the methods or a combination of both. Reading "Nutrition Facts" on packaged food  The "Nutrition Facts" list is included on the labels of almost all packaged foods and beverages in the [place] It includes:  The serving size.  Information about nutrients in each serving, including the grams (g) of carbohydrate per serving. To use the "Nutrition Facts":  Decide how many servings you will have.  Multiply the number of servings by the number of carbohydrates per serving.  The resulting number is the total amount of carbohydrates that you will be having. Learning standard serving sizes of other foods  When you eat foods containing carbohydrates that are not packaged or do not include "Nutrition Facts" on the label, you need to measure the servings in order to count the amount of carbohydrates:  Measure the foods that you will eat with a food scale or measuring  cup, if needed.  Decide how many standard-size servings you will eat.  Multiply the number of servings by 15. Most carbohydrate-rich foods have about 15 g of carbohydrates per serving.  For example, if you eat 8 oz (170 g) of strawberries, you will have eaten 2 servings and 30 g of carbohydrates (2 servings x 15 g = 30 g).  For foods that have more than one food mixed, such as soups and casseroles, you must count the carbohydrates in each food that is included. The following list contains standard serving sizes of common carbohydrate-rich foods. Each of these servings has about 15 g of carbohydrates:   hamburger bun or  English muffin.   oz (15 mL) syrup.   oz (14 g) jelly.  1 slice of bread.  1 six-inch tortilla.  3 oz (85 g) cooked rice or pasta.  4 oz (113 g) cooked dried beans.  4 oz (113 g) starchy vegetable, such as peas, corn, or potatoes.  4 oz (113 g) hot cereal.  4 oz (113 g) mashed potatoes or  of a large baked potato.  4 oz (113 g) canned or frozen fruit.  4 oz (120 mL) fruit juice.  4-6 crackers.  6 chicken nuggets.  6 oz (170 g) unsweetened dry cereal.  6 oz (170 g) plain fat-free yogurt or yogurt sweetened with artificial sweeteners.  8 oz (240 mL) milk.  8 oz (170 g) fresh fruit or one small piece of fruit.  24 oz (680 g) popped popcorn. Example of carbohydrate counting Sample meal  3 oz (85 g) chicken breast.  6 oz (  170 g) brown rice.  4 oz (113 g) corn.  8 oz (240 mL) milk.  8 oz (170 g) strawberries with sugar-free whipped topping. Carbohydrate calculation 1. Identify the foods that contain carbohydrates:  Rice.  Corn.  Milk.  Strawberries. 2. Calculate how many servings you have of each food:  2 servings rice.  1 serving corn.  1 serving milk.  1 serving strawberries. 3. Multiply each number of servings by 15 g:  2 servings rice x 15 g = 30 g.  1 serving corn x 15 g = 15 g.  1 serving milk x 15 g = 15  g.  1 serving strawberries x 15 g = 15 g. 4. Add together all of the amounts to find the total grams of carbohydrates eaten:  30 g + 15 g + 15 g + 15 g = 75 g of carbohydrates total. This information is not intended to replace advice given to you by your health care provider. Make sure you discuss any questions you have with your health care provider. Document Released: 02/13/2005 Document Revised: 09/03/2015 Document Reviewed: 07/28/2015 Elsevier Interactive Patient Education  2017 [employer].

## 2016-02-29 NOTE — Progress Notes (Signed)
Subjective:    Patient ID: Jessica Hart, female    DOB: 02/15/1941, 76 y.o.   MRN: [id number]  Jessica Hart is a 76 y.o. female who presents for follow-up of Type 2 diabetes mellitus.  Patient is checking home blood sugars.   Home blood sugar records: lowest 112 fasting and highest 320 (due to holidays and poor eating) back to 130 this morning.  How often is blood sugars being checked: 1-2 times a day Current symptoms include: none. Patient denies nausea, visual disturbances, vomiting and weight loss.  Patient is checking their feet daily. Any Foot concerns (callous, ulcer, wound, thickened nails, toenail fungus, skin fungus, hammer toe): no Last dilated eye exam: Had laser surgery last year. Will go get them checked  Current treatments: doing well on DM meds. Medication compliance: excellent  Current diet: in general, a "healthy" diet  except for over the holidays.  Current exercise: none plans to start going to the [employer] with her husband.  Known diabetic complications: none   Has a mammogram and bone density tomorrow. Is taking vitamin D.  Did not get Tdap, she has the prescription for this. States it was too expensive and over $100. Will check back this year.  She is aware that her BMI places her in the obese category. Plans to lose weight in the new year by eating healthy and starting to exercise.  Blood pressure medications - good compliance. No concerns and does not check BP at home.   The following portions of the patient's history were reviewed and updated as appropriate: allergies, current medications, past medical history, past social history and problem list.  ROS as in subjective above.     Objective:    Physical Exam Alert and in no distress otherwise not examined.  Blood pressure 128/80, pulse 72, weight 182 lb 6.4 oz (82.7 kg).  Lab Review Diabetic Labs Latest Ref Rng & Units 02/29/2016 01/06/2016 12/29/2015 10/20/2015 10/19/2015  HbA1c - 7.2% - - - 7.4%    Microalbumin mg/L - - - - -  Micro/Creat Ratio - - - - - -  Chol 125 - 200 mg/dL - - - 139 -  HDL >=46 mg/dL - - - 38(L) -  Calc LDL <130 mg/dL - - - 64 -  Triglycerides <150 mg/dL - - - 183(H) -  Creatinine 0.60 - 0.93 mg/dL - 0.88 0.90 0.76 -   BP/Weight 02/29/2016 01/06/2016 12/29/2015 40/98/1191 06/03/8293  Systolic BP 621 308 657 846 962  Diastolic BP 80 68 80 63 82  Wt. (Lbs) 182.4 181.2 182.2 182 183.8  BMI 31.31 31.1 31.27 31.24 31.55   Foot/eye exam completion dates 06/18/2015  Foot Form Completion Done    Jessica Hart  reports that she quit smoking about 10 years ago. She quit smokeless tobacco use about 10 years ago. She reports that she drinks alcohol. She reports that she does not use drugs.     Assessment & Plan:    Controlled type 2 diabetes mellitus without complication, unspecified long term insulin use status ([place]) - Plan: HgB A1c, COMPLETE METABOLIC PANEL WITH GFR  Needs flu shot - Plan: Flu vaccine HIGH DOSE PF (Fluzone High Dose)  HYPERTENSION, BENIGN SYSTEMIC  Obesity (BMI 30.0-34.9)  Hyperlipidemia associated with type 2 diabetes mellitus ([place]) - Plan: Lipid panel  1. Rx changes: none   A1C 7.2% today.  2. Education: Reviewed 'ABCs' of diabetes management (respective goals in parentheses):  A1C (<7), blood pressure (<130/80), and cholesterol (LDL <100). 3.  Compliance at present is estimated to be good. Efforts to improve compliance (if necessary) will be directed at dietary modifications: cut back on sugar and carbohydrates, increased exercise and regular blood sugar monitoring: daily. 4. Follow up: 6 months  5. Pan to have her come for lab visit and will check fasting lipids in March. Need CMP and lipids in March.  6. Blood pressure well controlled, continue current medications.  7. Discussed that her BMI places her in the obese category but she is not far from bringing her BMI down into the overweight class. She will start going to the gym with her husband,  start slow and build up with exercise.  8. She will check on Tdap, she has the prescription.  9. Flu shot given today.

## 2016-02-29 NOTE — Telephone Encounter (Signed)
Left message for pt to call back and schedule fasting lab visit for march

## 2016-03-01 ENCOUNTER — Ambulatory Visit
Admission: RE | Admit: 2016-03-01 | Discharge: 2016-03-01 | Disposition: A | Discharge status: Home / Self Care | Payer: Medicare HMO | Source: Ambulatory Visit | Attending: Family Medicine | Admitting: Family Medicine

## 2016-03-01 DIAGNOSIS — E559 Vitamin D deficiency, unspecified: Secondary | ICD-10-CM

## 2016-03-01 DIAGNOSIS — M8580 Other specified disorders of bone density and structure, unspecified site: Secondary | ICD-10-CM

## 2016-03-01 DIAGNOSIS — Z13820 Encounter for screening for osteoporosis: Secondary | ICD-10-CM | POA: Diagnosis not present

## 2016-03-01 DIAGNOSIS — E2839 Other primary ovarian failure: Secondary | ICD-10-CM

## 2016-03-01 DIAGNOSIS — Z1239 Encounter for other screening for malignant neoplasm of breast: Secondary | ICD-10-CM

## 2016-03-01 DIAGNOSIS — Z780 Asymptomatic menopausal state: Secondary | ICD-10-CM | POA: Diagnosis not present

## 2016-03-01 DIAGNOSIS — Z1231 Encounter for screening mammogram for malignant neoplasm of breast: Secondary | ICD-10-CM | POA: Diagnosis not present

## 2016-03-06 DIAGNOSIS — Z0000 Encounter for general adult medical examination without abnormal findings: Secondary | ICD-10-CM | POA: Diagnosis not present

## 2016-03-06 DIAGNOSIS — E669 Obesity, unspecified: Secondary | ICD-10-CM | POA: Diagnosis not present

## 2016-03-06 DIAGNOSIS — M159 Polyosteoarthritis, unspecified: Secondary | ICD-10-CM | POA: Diagnosis not present

## 2016-03-06 DIAGNOSIS — Z7982 Long term (current) use of aspirin: Secondary | ICD-10-CM | POA: Diagnosis not present

## 2016-03-06 DIAGNOSIS — Z6832 Body mass index (BMI) 32.0-32.9, adult: Secondary | ICD-10-CM | POA: Diagnosis not present

## 2016-03-06 DIAGNOSIS — Z7984 Long term (current) use of oral hypoglycemic drugs: Secondary | ICD-10-CM | POA: Diagnosis not present

## 2016-03-06 DIAGNOSIS — E119 Type 2 diabetes mellitus without complications: Secondary | ICD-10-CM | POA: Diagnosis not present

## 2016-03-06 DIAGNOSIS — I10 Essential (primary) hypertension: Secondary | ICD-10-CM | POA: Diagnosis not present

## 2016-03-06 DIAGNOSIS — K08409 Partial loss of teeth, unspecified cause, unspecified class: Secondary | ICD-10-CM | POA: Diagnosis not present

## 2016-03-06 DIAGNOSIS — E7800 Pure hypercholesterolemia, unspecified: Secondary | ICD-10-CM | POA: Diagnosis not present

## 2016-03-06 DIAGNOSIS — Z8719 Personal history of other diseases of the digestive system: Secondary | ICD-10-CM | POA: Diagnosis not present

## 2016-03-06 DIAGNOSIS — Z87891 Personal history of nicotine dependence: Secondary | ICD-10-CM | POA: Diagnosis not present

## 2016-03-17 ENCOUNTER — Other Ambulatory Visit: Payer: Self-pay | Admitting: Family Medicine

## 2016-04-03 ENCOUNTER — Other Ambulatory Visit: Payer: Self-pay | Admitting: Family Medicine

## 2016-04-07 ENCOUNTER — Other Ambulatory Visit: Payer: Self-pay | Admitting: Family Medicine

## 2016-04-07 DIAGNOSIS — R69 Illness, unspecified: Secondary | ICD-10-CM | POA: Diagnosis not present

## 2016-04-13 ENCOUNTER — Telehealth: Payer: Self-pay | Admitting: Family Medicine

## 2016-04-13 MED ORDER — METFORMIN HCL 1000 MG PO TABS: 1000.0000 mg | ORAL_TABLET | Freq: Two times a day (BID) | ORAL | 2 refills | Status: DC

## 2016-04-13 NOTE — Telephone Encounter (Signed)
Pt came by and requested refills on Metformin send to [facility]

## 2016-04-13 NOTE — Telephone Encounter (Signed)
Sent in med to pharmacy

## 2016-04-20 DIAGNOSIS — Z794 Long term (current) use of insulin: Secondary | ICD-10-CM | POA: Diagnosis not present

## 2016-04-20 DIAGNOSIS — H524 Presbyopia: Secondary | ICD-10-CM | POA: Diagnosis not present

## 2016-04-20 DIAGNOSIS — Z961 Presence of intraocular lens: Secondary | ICD-10-CM | POA: Diagnosis not present

## 2016-04-20 DIAGNOSIS — E119 Type 2 diabetes mellitus without complications: Secondary | ICD-10-CM | POA: Diagnosis not present

## 2016-04-20 LAB — HM DIABETES EYE EXAM

## 2016-05-01 ENCOUNTER — Encounter: Payer: Self-pay | Admitting: Family Medicine

## 2016-05-08 ENCOUNTER — Other Ambulatory Visit: Payer: Medicare HMO

## 2016-05-08 DIAGNOSIS — Z794 Long term (current) use of insulin: Secondary | ICD-10-CM

## 2016-05-08 DIAGNOSIS — E1169 Type 2 diabetes mellitus with other specified complication: Secondary | ICD-10-CM

## 2016-05-08 DIAGNOSIS — E785 Hyperlipidemia, unspecified: Principal | ICD-10-CM

## 2016-05-08 DIAGNOSIS — E119 Type 2 diabetes mellitus without complications: Secondary | ICD-10-CM

## 2016-05-08 DIAGNOSIS — IMO0001 Reserved for inherently not codable concepts without codable children: Secondary | ICD-10-CM

## 2016-05-08 DIAGNOSIS — E1165 Type 2 diabetes mellitus with hyperglycemia: Secondary | ICD-10-CM

## 2016-05-08 MED ORDER — LINAGLIPTIN 5 MG PO TABS: 5.0000 mg | ORAL_TABLET | Freq: Every day | ORAL | 1 refills | Status: DC

## 2016-05-08 MED ORDER — INSULIN DEGLUDEC 100 UNIT/ML ~~LOC~~ SOPN: 10.0000 [IU] | PEN_INJECTOR | Freq: Every day | SUBCUTANEOUS | 1 refills | Status: DC

## 2016-05-09 LAB — COMPLETE METABOLIC PANEL WITH GFR
ALBUMIN: 4.4 g/dL (ref 3.6–5.1)
ALK PHOS: 108 U/L (ref 33–130)
ALT: 17 U/L (ref 6–29)
AST: 29 U/L (ref 10–35)
BUN: 11 mg/dL (ref 7–25)
CHLORIDE: 105 mmol/L (ref 98–110)
CO2: 23 mmol/L (ref 20–31)
Calcium: 9.4 mg/dL (ref 8.6–10.4)
Creat: 0.76 mg/dL (ref 0.60–0.93)
GFR, Est African American: 89 mL/min (ref >=60)
GFR, Est Non African American: 78 mL/min (ref >=60)
GLUCOSE: 178 mg/dL — AB (ref 65–99)
POTASSIUM: 4.4 mmol/L (ref 3.5–5.3)
SODIUM: 141 mmol/L (ref 135–146)
Total Bilirubin: 0.5 mg/dL (ref 0.2–1.2)
Total Protein: 7.8 g/dL (ref 6.1–8.1)

## 2016-05-09 LAB — LIPID PANEL
CHOL/HDL RATIO: 4.2 ratio (ref <5.0)
Cholesterol: 133 mg/dL (ref <200)
HDL: 32 mg/dL — ABNORMAL LOW (ref >50)
LDL CALC: 65 mg/dL (ref <100)
Triglycerides: 180 mg/dL — ABNORMAL HIGH (ref <150)
VLDL: 36 mg/dL — ABNORMAL HIGH (ref <30)

## 2016-05-10 ENCOUNTER — Telehealth: Payer: Self-pay | Admitting: Family Medicine

## 2016-05-10 NOTE — Telephone Encounter (Signed)
Pt having trouble affording Jessica Hart & Tradjenta,  Will assist pt with Pt Assistance for both, samples given per Sabrina to hold pt

## 2016-05-16 ENCOUNTER — Telehealth: Payer: Self-pay | Admitting: Family Medicine

## 2016-05-16 NOTE — Telephone Encounter (Signed)
PT ASSISTANCE TRADJENTA forms completed and faxed

## 2016-05-23 ENCOUNTER — Other Ambulatory Visit: Payer: Self-pay | Admitting: Family Medicine

## 2016-06-02 ENCOUNTER — Other Ambulatory Visit: Payer: Self-pay | Admitting: Family Medicine

## 2016-06-13 NOTE — Telephone Encounter (Signed)
Pt brought by additional income information to fax to Pt Assistance and samples given to hold pt

## 2016-07-12 NOTE — Telephone Encounter (Signed)
Pt Assistance Tradjenta approved, called pt and informed.  Samples given Tyler Aas, she has to spend $1000 out of pocket before can qualify for Pt Assistance for [place].

## 2016-07-13 DIAGNOSIS — R69 Illness, unspecified: Secondary | ICD-10-CM | POA: Diagnosis not present

## 2016-08-03 ENCOUNTER — Other Ambulatory Visit: Payer: Self-pay | Admitting: Family Medicine

## 2016-08-28 ENCOUNTER — Ambulatory Visit: Payer: Medicare HMO | Admitting: Family Medicine

## 2016-08-29 ENCOUNTER — Encounter: Payer: Self-pay | Admitting: Family Medicine

## 2016-08-29 ENCOUNTER — Ambulatory Visit (INDEPENDENT_AMBULATORY_CARE_PROVIDER_SITE_OTHER): Payer: Medicare HMO | Admitting: Family Medicine

## 2016-08-29 VITALS — BP 138/80 | HR 69 | Ht 64.0 in | Wt 183.2 lb

## 2016-08-29 DIAGNOSIS — Z79899 Other long term (current) drug therapy: Secondary | ICD-10-CM | POA: Diagnosis not present

## 2016-08-29 DIAGNOSIS — E1165 Type 2 diabetes mellitus with hyperglycemia: Secondary | ICD-10-CM | POA: Diagnosis not present

## 2016-08-29 DIAGNOSIS — I10 Essential (primary) hypertension: Secondary | ICD-10-CM

## 2016-08-29 DIAGNOSIS — K219 Gastro-esophageal reflux disease without esophagitis: Secondary | ICD-10-CM

## 2016-08-29 DIAGNOSIS — E669 Obesity, unspecified: Secondary | ICD-10-CM | POA: Diagnosis not present

## 2016-08-29 DIAGNOSIS — E1169 Type 2 diabetes mellitus with other specified complication: Secondary | ICD-10-CM | POA: Diagnosis not present

## 2016-08-29 DIAGNOSIS — IMO0001 Reserved for inherently not codable concepts without codable children: Secondary | ICD-10-CM

## 2016-08-29 DIAGNOSIS — I152 Hypertension secondary to endocrine disorders: Secondary | ICD-10-CM | POA: Insufficient documentation

## 2016-08-29 DIAGNOSIS — E785 Hyperlipidemia, unspecified: Secondary | ICD-10-CM

## 2016-08-29 DIAGNOSIS — E559 Vitamin D deficiency, unspecified: Secondary | ICD-10-CM

## 2016-08-29 DIAGNOSIS — Z794 Long term (current) use of insulin: Secondary | ICD-10-CM

## 2016-08-29 LAB — CBC WITH DIFFERENTIAL/PLATELET
BASOS ABS: 97 {cells}/uL (ref 0–200)
Basophils Relative: 1 %
EOS ABS: 194 {cells}/uL (ref 15–500)
EOS PCT: 2 %
HCT: 41.3 % (ref 35.0–45.0)
Hemoglobin: 13.8 g/dL (ref 11.7–15.5)
LYMPHS ABS: 2328 {cells}/uL (ref 850–3900)
Lymphocytes Relative: 24 %
MCH: 27.9 pg (ref 27.0–33.0)
MCHC: 33.4 g/dL (ref 32.0–36.0)
MCV: 83.4 fL (ref 80.0–100.0)
MPV: 8.9 fL (ref 7.5–12.5)
Monocytes Absolute: 776 cells/uL (ref 200–950)
Monocytes Relative: 8 %
NEUTROS PCT: 65 %
Neutro Abs: 6305 cells/uL (ref 1500–7800)
Platelets: 342 10*3/uL (ref 140–400)
RBC: 4.95 MIL/uL (ref 3.80–5.10)
RDW: 15.3 % — ABNORMAL HIGH (ref 11.0–15.0)
WBC: 9.7 10*3/uL (ref 4.0–10.5)

## 2016-08-29 LAB — LIPID PANEL
CHOL/HDL RATIO: 4.4 ratio (ref <5.0)
CHOLESTEROL: 142 mg/dL (ref <200)
HDL: 32 mg/dL — AB (ref >50)
LDL Cholesterol: 82 mg/dL (ref <100)
TRIGLYCERIDES: 140 mg/dL (ref <150)
VLDL: 28 mg/dL (ref <30)

## 2016-08-29 LAB — TSH: TSH: 0.96 mIU/L

## 2016-08-29 LAB — BASIC METABOLIC PANEL
BUN: 7 mg/dL (ref 7–25)
CALCIUM: 9.5 mg/dL (ref 8.6–10.4)
CO2: 21 mmol/L (ref 20–31)
CREATININE: 0.79 mg/dL (ref 0.60–0.93)
Chloride: 109 mmol/L (ref 98–110)
GLUCOSE: 134 mg/dL — AB (ref 65–99)
Potassium: 3.5 mmol/L (ref 3.5–5.3)
SODIUM: 141 mmol/L (ref 135–146)

## 2016-08-29 LAB — POCT GLYCOSYLATED HEMOGLOBIN (HGB A1C)

## 2016-08-29 MED ORDER — INSULIN DEGLUDEC 100 UNIT/ML ~~LOC~~ SOPN: 10.0000 [IU] | PEN_INJECTOR | Freq: Every day | SUBCUTANEOUS | 1 refills | Status: DC

## 2016-08-29 MED ORDER — OMEPRAZOLE 40 MG PO CPDR: 40.0000 mg | DELAYED_RELEASE_CAPSULE | Freq: Every day | ORAL | 2 refills | Status: DC

## 2016-08-29 NOTE — Progress Notes (Signed)
Subjective:    Patient ID: Jessica Hart, female    DOB: [birth year], 76 y.o.   MRN: [id number]  Chief Complaint  Patient presents with  . diabetes    diabetes. would like an rx for omeprazole 52m for acid reflux. been taking some of her husbands med adn helping   Complains of acid reflux with a history of this.  States she has been taking her husband's omeprazole and this has been helping with symptoms. Requests a refill.  No other concerns or questions today.      Jessica MCCARTHYis a 76y.o. female who presents for follow-up of Type 2 diabetes mellitus.  Patient is checking home blood sugars.   Home blood sugar records: BGs are running  consistent with Hgb A1C, BGs range between 126 and 140s How often is blood sugars being checked: daily Current symptoms include: none. Patient denies foot ulcerations, hyperglycemia, hypoglycemia , increased appetite, nausea, paresthesia of the feet, polydipsia, polyuria, visual disturbances, vomiting and weight loss.  Patient is checking their feet daily. Any Foot concerns (callous, ulcer, wound, thickened nails, toenail fungus, skin fungus, hammer toe): none  [facility]   Current treatments: medciations which have been somewhat effective . Medication compliance: good  Current diet: in general, an "unhealthy" diet Current exercise: none Known diabetic complications: none  The following portions of the patient's history were reviewed and updated as appropriate: allergies, current medications, past medical history, past social history and problem list.  ROS as in subjective above.     Objective:    Physical Exam Alert and in no distress.  Foot exam done and normal.   Blood pressure 138/80, pulse 69, height _0  (1.626 m), weight 183 lb 3.2 oz (83.1 kg).  Lab Review Diabetic Labs Latest Ref Rng & Units 08/29/2016 05/08/2016 02/29/2016 01/06/2016 12/29/2015  HbA1c - 7.8% - 7.2% - -    Microalbumin mg/L - - - - -  Micro/Creat Ratio - - - - - -  Chol <200 mg/dL - 133 - - -  HDL >50 mg/dL - 32(L) - - -  Calc LDL <100 mg/dL - 65 - - -  Triglycerides <150 mg/dL - 180(H) - - -  Creatinine 0.60 - 0.93 mg/dL - 0.76 - 0.88 0.90   BP/Weight 08/29/2016 02/29/2016 01/06/2016 12/29/2015 168/01/7516 Systolic BP [id number] Diastolic BP 80 80 68 80 63  Wt. (Lbs) 183.2 182.4 181.2 182.2 182  BMI 31.45 31.31 31.1 31.27 31.24   Foot/eye exam completion dates Latest Ref Rng & Units 08/29/2016 04/20/2016  Eye Exam No Retinopathy - No Retinopathy  Foot Form Completion - Done -    Jessica Hart  reports that she quit smoking about 11 years ago. She quit smokeless tobacco use about 11 years ago. She reports that she drinks alcohol. She reports that she does not use drugs.     Assessment & Plan:    Uncontrolled type 2 diabetes mellitus without complication, with long-term current use of insulin ([place] - Plan: HgB A1c, CBC with Differential/Platelet, Microalbumin / creatinine urine ratio, TSH, Basic metabolic panel  Gastroesophageal reflux disease, esophagitis presence not specified - Plan: omeprazole (PRILOSEC) 40 MG capsule  Essential hypertension - Plan: CBC with Differential/Platelet, Basic metabolic panel  Hyperlipidemia associated with type 2 diabetes mellitus ([place] - Plan: Lipid panel  Obesity (BMI 30-39.9) - Plan: TSH, Lipid panel  Medication management - Plan: VITAMIN D 25 Hydroxy (Vit-D Deficiency,  Fractures)  Vitamin D deficiency - Plan: VITAMIN D 25 Hydroxy (Vit-D Deficiency, Fractures)  1. Rx changes: none  Hgb A1c is 7.8% and up from 7.2%. Discussed medication options but per patient request, will keep her on her same medication regimen. Samples of [place] given today. She thinks she can improve her blood sugars by exercising and eating healthier.  2. Education: Reviewed 'ABCs' of diabetes management (respective goals in parentheses):  A1C (<7), blood  pressure (<130/80), and cholesterol (LDL <100). 3. Compliance at present is estimated to be fair. Efforts to improve compliance (if necessary) will be directed at dietary modifications: cut back on carbohydrates, sugar and cut back on portion sizes. , increased exercise and regular blood sugar monitoring: one times daily. 4. Discussed weight loss goal of 13 lbs over the next 6 months. She appears motivated to do this. Would like to weight 170.  5. Will check fasting lipids.  6. She is taking vitamin D daily and will need to recheck her vitamin D level.  7. GERD- counseled on lifestyle management and will send in prescription for omeprazole per request. Discussed taking this only as needed and not daily due to potential risks.  8. Follow up: 3-4 months.

## 2016-08-29 NOTE — Patient Instructions (Addendum)
Your hemoglobin A1c is up some today at 7.8%. Continue on your current medications and start getting more exercise.  Cut back on portion sizes, carbohydrates such as potatoes, rice, pasta, breads, and sweets.   Check your BP at home and let me know if your BP is not <130/80 on a consistent basis.   Only take the omeprazole as needed and as little as possible. Try to watch food triggers and do the lifestyle management we discussed to help with reflux.   Continue on vitamin D and getting enough calcium in your diet.   We will call you with lab results.   Follow up in 4 months for a medicare wellness visit along with  diabetes and other health conditions.

## 2016-08-30 LAB — MICROALBUMIN / CREATININE URINE RATIO
CREATININE, URINE: 238 mg/dL (ref 20–320)
Microalb Creat Ratio: 14 mcg/mg creat (ref <30)
Microalb, Ur: 3.4 mg/dL

## 2016-08-30 LAB — VITAMIN D 25 HYDROXY (VIT D DEFICIENCY, FRACTURES): Vit D, 25-Hydroxy: 35 ng/mL (ref 30–100)

## 2016-08-31 ENCOUNTER — Other Ambulatory Visit: Payer: Self-pay | Admitting: Family Medicine

## 2016-08-31 DIAGNOSIS — R69 Illness, unspecified: Secondary | ICD-10-CM | POA: Diagnosis not present

## 2016-09-04 ENCOUNTER — Other Ambulatory Visit: Payer: Self-pay | Admitting: Family Medicine

## 2016-09-15 ENCOUNTER — Other Ambulatory Visit: Payer: Self-pay | Admitting: Family Medicine

## 2016-09-27 ENCOUNTER — Telehealth: Payer: Self-pay | Admitting: Family Medicine

## 2016-09-27 NOTE — Telephone Encounter (Signed)
Called Pt Assistance and pt Recv'd Tradjenta end of June & they will ship again end of August.

## 2016-10-16 DIAGNOSIS — R69 Illness, unspecified: Secondary | ICD-10-CM | POA: Diagnosis not present

## 2016-10-26 ENCOUNTER — Other Ambulatory Visit: Payer: Self-pay | Admitting: Family Medicine

## 2016-11-13 ENCOUNTER — Telehealth: Payer: Self-pay | Admitting: Internal Medicine

## 2016-11-13 MED ORDER — INSULIN DEGLUDEC 100 UNIT/ML ~~LOC~~ SOPN: 10.0000 [IU] | PEN_INJECTOR | Freq: Every day | SUBCUTANEOUS | 0 refills | Status: DC

## 2016-11-13 NOTE — Telephone Encounter (Signed)
Pt came by today and asked for samples for tresiba and pen needles as she is out. She has an appt in November so I gave her some to hope it gets to her appt

## 2016-11-27 ENCOUNTER — Other Ambulatory Visit: Payer: Self-pay | Admitting: Family Medicine

## 2016-12-13 ENCOUNTER — Other Ambulatory Visit: Payer: Self-pay | Admitting: Family Medicine

## 2017-01-01 ENCOUNTER — Ambulatory Visit: Payer: Medicare HMO | Admitting: Family Medicine

## 2017-01-09 ENCOUNTER — Ambulatory Visit: Payer: Medicare HMO | Admitting: Family Medicine

## 2017-01-09 NOTE — Progress Notes (Signed)
Jessica Hart is a 76 y.o. female who presents for annual wellness visit and follow-up on chronic medical conditions.  She has the following concerns:  HTN- is not checking BP at home.  DM- Tresiba 10 units daily. Metformin and Tradjenta.  Checks fasting BS most days and readings have been averaging 120, no low readings States she has improved diet, cut out sugary drinks.   Complains of mild intermittent external vaginal itching.   Reports a new problem of a abnormal sensation down her left medial lower leg and into her foot for the past several weeks. States she thinks she has some decreased sensation in her left foot. Denies pain, swelling.   Reflux well controlled on medication and watching food triggers.   Tolerating statin. LDL in July 2018 82.   Former smoker, 45 pack year, stopped in 2007. Asymptomatic.    Immunization History  Administered Date(s) Administered  . Influenza Whole 12/11/2007  . Influenza, High Dose Seasonal PF 02/29/2016, 01/10/2017  . Influenza,inj,Quad PF,6+ Mos 10/29/2012, 04/06/2014, 02/19/2015  . Pneumococcal Conjugate-13 04/06/2014  . Pneumococcal Polysaccharide-23 12/11/2007  . Td 05/28/2004   Last Pap smear: hysterectomy at age 60 questionable precancerous cells Last mammogram: 03/01/2016  Last colonoscopy: [facility] in October 2010. Due in 2020  Last DEXA: 03/01/2016 and normal  Dentist: dentures upper- [facility]  Ophtho: 2017  Exercise: nothing lately   Other doctors caring for patient include: Orthopedist- cannot recall  Eyes- Dr. Gershon Crane  GI- [facility]    Depression screen:  See questionnaire below.  Depression screen [facility] 2/9 01/10/2017 12/29/2015 05/13/2015 01/11/2015 08/05/2014  Decreased Interest 0 0 0 0 0  Down, Depressed, Hopeless 0 0 0 0 0  PHQ - 2 Score 0 0 0 0 0    Fall Risk Screen: see questionnaire below. Fall Risk  01/10/2017 12/29/2015 05/13/2015 01/11/2015 08/05/2014  Falls in the past year? _0      ADL screen:  See questionnaire below Functional Status Survey: Is the patient deaf or have difficulty hearing?: No Does the patient have difficulty seeing, even when wearing glasses/contacts?: No Does the patient have difficulty concentrating, remembering, or making decisions?: No Does the patient have difficulty walking or climbing stairs?: No Does the patient have difficulty dressing or bathing?: No Does the patient have difficulty doing errands alone such as visiting a doctor's office or shopping?: No   End of Life Discussion:  Patient does not have a living will and medical power of attorney and does not wish to fill these out.   Review of Systems Constitutional: -fever, -chills, -sweats, -unexpected weight change, -anorexia, -fatigue Allergy: -sneezing, -itching, -congestion Dermatology: denies changing moles, rash, lumps, new worrisome lesions ENT: -runny nose, -ear pain, -sore throat, -hoarseness, -sinus pain, -teeth pain, -tinnitus, -hearing loss, -epistaxis Cardiology:  -chest pain, -palpitations, -edema, -orthopnea, -paroxysmal nocturnal dyspnea Respiratory: -cough, -shortness of breath, -dyspnea on exertion, -wheezing, -hemoptysis Gastroenterology: -abdominal pain, -nausea, -vomiting, -diarrhea, -constipation, -blood in stool, -changes in bowel movement, -dysphagia Hematology: -bleeding or bruising problems Musculoskeletal: -arthralgias, -myalgias, -joint swelling, -back pain, -neck pain, -cramping, -gait changes Ophthalmology: -vision changes, -eye redness, -itching, -discharge Urology: -dysuria, -difficulty urinating, -hematuria, -urinary frequency, -urgency, incontinence Neurology: -headache, -weakness, +tingling LLE, ?numbness left foot, -speech abnormality, -memory loss, -falls, -dizziness Psychology:  -depressed mood, -agitation, -sleep problems   PHYSICAL EXAM:  BP (!) 148/70   Pulse 69   Ht 5' 3.5" (1.613 m)   Wt 182 lb (82.6 kg)   BMI 31.73 kg/m  General Appearance: Alert, cooperative, no distress, appears stated age Head: Normocephalic, without obvious abnormality, atraumatic Eyes: PERRL, conjunctiva/corneas clear, EOM's intact, fundi benign Ears: Normal TM's and external ear canals Nose: Nares normal, mucosa normal, no drainage or sinus   tenderness Throat: Lips, mucosa, and tongue normal; teeth and gums normal Neck: Supple, no lymphadenopathy; thyroid: no enlargement/tenderness/nodules; no carotid bruit or JVD Back: Spine nontender, no curvature, ROM normal, no CVA tenderness Lungs: Clear to auscultation bilaterally without wheezes, rales or ronchi; respirations unlabored Chest Wall: No tenderness or deformity Heart: Regular rate and rhythm, S1 and S2 normal, no murmur, rub or gallop Breast Exam: No tenderness, masses, or nipple discharge or inversion. No axillary lymphadenopathy Abdomen: Soft, non-tender, nondistended, normoactive bowel sounds, no masses, no hepatosplenomegaly Genitalia: Normal external genitalia without lesions.  BUS and vagina normal;. No abnormal vaginal discharge. adnexa not enlarged, nontender, no masses.    Extremities: No clubbing, cyanosis or edema, mildly decreased sensation to medial left ankle  Pulses: 2+ and symmetric all extremities Skin: Skin color, texture, turgor normal, no rashes or lesions Lymph nodes: Cervical, supraclavicular, and axillary nodes normal Neurologic: CNII-XII intact, normal strength, sensation and gait; reflexes 2+ and symmetric throughout Psych: Normal mood, affect, hygiene and grooming.  ASSESSMENT/PLAN: Medicare annual wellness visit, subsequent  Controlled type 2 diabetes mellitus without complication, with long-term current use of insulin ([place]) - Plan: HgB A1c, linagliptin (TRADJENTA) 5 MG TABS tablet, VAS [place] LOWER EXTREMITY ARTERIAL DUPLEX, BASIC METABOLIC PANEL WITH GFR  Needs flu shot - Plan: Flu vaccine HIGH DOSE PF (Fluzone High Dose)  Former smoker - Plan:  Ambulatory referral to Pulmonology  Vaginal itching  Hyperlipidemia associated with type 2 diabetes mellitus (HCC)  Decreased sensation of lower extremity - Plan: VAS [place] ABI WITH/WO TBI, VAS [place] LOWER EXTREMITY ARTERIAL DUPLEX  Abnormal sensation of leg - Plan: VAS [place] ABI WITH/WO TBI, VAS [place] LOWER EXTREMITY ARTERIAL DUPLEX  Atherosclerosis - Plan: VAS [place] LOWER EXTREMITY ARTERIAL DUPLEX  Gastroesophageal reflux disease, esophagitis presence not specified - Plan: omeprazole (PRILOSEC) 40 MG capsule  Hypertension associated with diabetes ([place]) - Plan: VAS [place] LOWER EXTREMITY ARTERIAL DUPLEX, BASIC METABOLIC PANEL WITH GFR  Encounter for screening for lung cancer - Plan: Ambulatory referral to Pulmonology  HbA1c 6.9% today. Diabetes is controlled on current diet and medication regimen. Continue on meds and increase activity.  HTN- BP is not at goal. Will have her check her BP at home and let me know if her readings are not in goal range. Discussed diet and exercise for this.  Abnormal sensation and questionable decreased sensation to LLE. ABIs ordered.  Significant smoking history without lung cancer screening in the past. Will order low dose CT for lung cancer screening.  GERD- well controlled. Continue medication but use only as needed and not daily.  Atherosclerosis diagnosis in her record but it is unclear where this diagnosis came from.  Flu shot given.  She has a prescription for Tdap but states it is not affordable.  Discussed checking with her insurance on Shingrix.  She is not interested in advance directives.  Mammogram and DEXA up to date.  Follow up pending labs and ABI, CT results or in 3 months.   Discussed monthly self breast exams and yearly mammograms; at least 30 minutes of aerobic activity at least 5 days/week and weight-bearing exercise 2x/week; proper sunscreen use reviewed; healthy diet, including goals of calcium and vitamin D intake and alcohol recommendations (less  than or equal to 1 drink/day)  reviewed; regular seatbelt use; changing batteries in smoke detectors.  Immunization recommendations discussed.  Colonoscopy recommendations reviewed   Medicare Attestation I have personally reviewed: The patient's medical and social history Their use of alcohol, tobacco or illicit drugs Their current medications and supplements The patient's functional ability including ADLs,fall risks, home safety risks, cognitive, and hearing and visual impairment Diet and physical activities Evidence for depression or mood disorders  The patient's weight, height, and BMI have been recorded in the chart.  I have made referrals, counseling, and provided education to the patient based on review of the above and I have provided the patient with a written personalized care plan for preventive services.     Harland Dingwall, NP-C   01/11/2017

## 2017-01-10 ENCOUNTER — Ambulatory Visit (INDEPENDENT_AMBULATORY_CARE_PROVIDER_SITE_OTHER): Payer: Medicare HMO | Admitting: Family Medicine

## 2017-01-10 ENCOUNTER — Encounter: Payer: Self-pay | Admitting: Family Medicine

## 2017-01-10 VITALS — BP 148/70 | HR 69 | Ht 63.5 in | Wt 182.0 lb

## 2017-01-10 DIAGNOSIS — K219 Gastro-esophageal reflux disease without esophagitis: Secondary | ICD-10-CM

## 2017-01-10 DIAGNOSIS — E119 Type 2 diabetes mellitus without complications: Secondary | ICD-10-CM | POA: Diagnosis not present

## 2017-01-10 DIAGNOSIS — E785 Hyperlipidemia, unspecified: Secondary | ICD-10-CM | POA: Diagnosis not present

## 2017-01-10 DIAGNOSIS — E1159 Type 2 diabetes mellitus with other circulatory complications: Secondary | ICD-10-CM | POA: Diagnosis not present

## 2017-01-10 DIAGNOSIS — R69 Illness, unspecified: Secondary | ICD-10-CM | POA: Diagnosis not present

## 2017-01-10 DIAGNOSIS — R209 Unspecified disturbances of skin sensation: Secondary | ICD-10-CM | POA: Diagnosis not present

## 2017-01-10 DIAGNOSIS — I7090 Unspecified atherosclerosis: Secondary | ICD-10-CM

## 2017-01-10 DIAGNOSIS — Z0000 Encounter for general adult medical examination without abnormal findings: Secondary | ICD-10-CM

## 2017-01-10 DIAGNOSIS — Z794 Long term (current) use of insulin: Secondary | ICD-10-CM

## 2017-01-10 DIAGNOSIS — I152 Hypertension secondary to endocrine disorders: Secondary | ICD-10-CM

## 2017-01-10 DIAGNOSIS — Z122 Encounter for screening for malignant neoplasm of respiratory organs: Secondary | ICD-10-CM

## 2017-01-10 DIAGNOSIS — N898 Other specified noninflammatory disorders of vagina: Secondary | ICD-10-CM | POA: Diagnosis not present

## 2017-01-10 DIAGNOSIS — R208 Other disturbances of skin sensation: Secondary | ICD-10-CM

## 2017-01-10 DIAGNOSIS — Z23 Encounter for immunization: Secondary | ICD-10-CM | POA: Diagnosis not present

## 2017-01-10 DIAGNOSIS — E1169 Type 2 diabetes mellitus with other specified complication: Secondary | ICD-10-CM

## 2017-01-10 DIAGNOSIS — I10 Essential (primary) hypertension: Secondary | ICD-10-CM | POA: Diagnosis not present

## 2017-01-10 DIAGNOSIS — Z87891 Personal history of nicotine dependence: Secondary | ICD-10-CM | POA: Diagnosis not present

## 2017-01-10 HISTORY — DX: Personal history of nicotine dependence: Z87.891

## 2017-01-10 LAB — BASIC METABOLIC PANEL WITH GFR
BUN: 8 mg/dL (ref 7–25)
CALCIUM: 9.4 mg/dL (ref 8.6–10.4)
CO2: 28 mmol/L (ref 20–32)
CREATININE: 0.69 mg/dL (ref 0.60–0.93)
Chloride: 106 mmol/L (ref 98–110)
GFR, EST AFRICAN AMERICAN: 99 mL/min/{1.73_m2} (ref >=60)
GFR, EST NON AFRICAN AMERICAN: 86 mL/min/{1.73_m2} (ref >=60)
Glucose, Bld: 74 mg/dL (ref 65–99)
POTASSIUM: 3.6 mmol/L (ref 3.5–5.3)
Sodium: 144 mmol/L (ref 135–146)

## 2017-01-10 LAB — POCT GLYCOSYLATED HEMOGLOBIN (HGB A1C): Hemoglobin A1C: 6.9

## 2017-01-10 MED ORDER — LOSARTAN POTASSIUM 25 MG PO TABS: 25.0000 mg | ORAL_TABLET | Freq: Every day | ORAL | 1 refills | Status: DC

## 2017-01-10 MED ORDER — OMEPRAZOLE 40 MG PO CPDR: 40.0000 mg | DELAYED_RELEASE_CAPSULE | Freq: Every day | ORAL | 1 refills | Status: DC

## 2017-01-10 MED ORDER — INSULIN DEGLUDEC 100 UNIT/ML ~~LOC~~ SOPN: 10.0000 [IU] | PEN_INJECTOR | Freq: Every day | SUBCUTANEOUS | 1 refills | Status: DC

## 2017-01-10 MED ORDER — METFORMIN HCL 1000 MG PO TABS: 1000.0000 mg | ORAL_TABLET | Freq: Two times a day (BID) | ORAL | 1 refills | Status: DC

## 2017-01-10 MED ORDER — GLUCOSE BLOOD VI STRP: ORAL_STRIP | 3 refills | Status: DC

## 2017-01-10 MED ORDER — LOVASTATIN 20 MG PO TABS: 20.0000 mg | ORAL_TABLET | Freq: Every day | ORAL | 1 refills | Status: DC

## 2017-01-10 MED ORDER — ONETOUCH DELICA LANCETS FINE MISC: 3 refills | Status: DC

## 2017-01-10 MED ORDER — LINAGLIPTIN 5 MG PO TABS: 5.0000 mg | ORAL_TABLET | Freq: Every day | ORAL | 1 refills | Status: DC

## 2017-01-10 MED ORDER — AMLODIPINE BESYLATE 10 MG PO TABS: 10.0000 mg | ORAL_TABLET | Freq: Every day | ORAL | 1 refills | Status: DC

## 2017-01-10 MED ORDER — INSULIN DEGLUDEC 100 UNIT/ML ~~LOC~~ SOPN: 10.0000 [IU] | PEN_INJECTOR | Freq: Every day | SUBCUTANEOUS | 0 refills | Status: DC

## 2017-01-10 MED ORDER — METOPROLOL TARTRATE 100 MG PO TABS: 100.0000 mg | ORAL_TABLET | Freq: Two times a day (BID) | ORAL | 1 refills | Status: DC

## 2017-01-10 NOTE — Patient Instructions (Addendum)
MEDICARE PREVENTATIVE SERVICES (FEMALE) AND PERSONALIZED PLAN for [facility] January 10, 2017  CONDITIONS OR RISKS IDENTIFIED TODAY Risk for falls due to abnormal sensation of left ankle.  Increased risk for lung cancer due to history of smoking for more than 40 years.   Your BP is elevated today at 148/70. Check it some days each days week and if you are not seeing readings <130/80 let me know.  Cut back on salty foods such as pork.     SPECIFIC RECOMMENDATIONS: We are sending you for ABI testing.  I am ordering a low dose CT scan of your chest to screen for lung cancer.   Continue on your current medications for diabetes and blood pressure.  Stop taking calcium supplements and just get this in your diet.  Stop taking vitamin E supplement.   Continue taking vitamin D.    Influenza vaccine: today  Pneumococcal vaccine: up to date  Shingles vaccine: you can get this at your pharmacy but call your insurance company first to check cost.  Tdap vaccine: you can get this at your pharmacy but call your insurance company first to check cost.  Colonoscopy: 2010 Mammogram: 02/2016 Pelvic exam: done today  Pap smear: not needed due to hysterectomy    Return in 4 months for diabetes check.     GENERAL RECOMMENDATIONS FOR GOOD HEALTH:  Supplements:  . Take a daily baby Aspirin 55m at bedtime for heart health unless you have a history of gastrointestinal bleed, allergy to aspirin, or are already taking higher dose Aspirin or other antiplatelet or blood thinner medication.   . Consume 1200 mg of Calcium daily through dietary calcium or supplement if you are female age 9381or older, or men 738and older.   Men aged 557-70should consume 1000 mg of Calcium daily. . Take 600 IU of Vitamin D daily.  Take 800 IU of Calcium daily if you are older than age 854  . Take a general multivitamin daily.   Healthy diet: Eat a variety of foods, including fruits, vegetables, vegetable protein  such as beans, lentils, tofu, and grains, such as rice.  Limit meat or animal protein, but if you eat meat, choose leans cuts such as chicken, fish, or t[place]  Drink plenty of water daily.  Decrease saturated fat in the diet, avoid lots of red meat, processed foods, sweets, fast foods, and fried foods.  Limit salt and caffeine intake.  Exercise: Aerobic exercise helps maintain good heart health. Weight bearing exercise helps keep bones and muscles working strong.  We recommend at least 30-40 minutes of exercise most days of the week.   Fall prevention: Falls are the leading cause of injuries, accidents, and accidental deaths in people over the age of 67 Falling is a real threat to your ability to live on your own.  Causes include poor eyesight or poor hearing, illness, poor lighting, throw rugs, clutter in your home, and medication side effects causing dizziness or balance problems.  Such medications can include medications for depression, sleep problems, high blood pressure, diabetes, and heart conditions.   PREVENTION  Be sure your home is as safe as possible. Here are some tips:  Wear shoes with non-skid soles (not house slippers).   Be sure your home and outside area are well lit.   Use night lights throughout your house, including hallways and stairways.   Remove clutter and clean up spills on floors and walkways.   Remove throw rugs or fasten them to  the floor with carpet tape. Tack down carpet edges.   Do not place electrical cords across pathways.   Install grab bars in your bathtub, shower, and toilet area. Towel bars should not be used as a grab bar.   Install handrails on both sides of stairways.   Do not climb on stools or stepladders. Get someone else to help with jobs that require climbing.   Do not wax your floors at all, or use a non-skid wax.   Repair uneven or unsafe sidewalks, walkways or stairs.   Keep frequently used items within reach.   Be aware of pets so  you do not trip.  Get regular check-ups from your doctor, and take good care of yourself:  Have your eyes checked every year for vision changes, cataracts, glaucoma, and other eye problems. Wear eyeglasses as directed.   Have your hearing checked every 2 years, or anytime you or others think that you cannot hear well. Use hearing aids as directed.   See your caregiver if you have foot pain or corns. Sore feet can contribute to falls.   Let your caregiver know if a medicine is making you feel dizzy or making you lose your balance.   Use a cane, walker, or wheelchair as directed. Use walker or wheelchair brakes when getting in and out.   When you get up from bed, sit on the side of the bed for 1 to 2 minutes before you stand up. This will give your blood pressure time to adjust, and you will feel less dizzy.   If you need to go to the bathroom often, consider using a bedside commode.  Disease prevention:  If you smoke or chew tobacco, find out from your caregiver how to quit. It can literally save your life, no matter how long you have been a tobacco user. If you do not use tobacco, never begin. Medicare does cover some smoking cessation counseling.  Maintain a healthy diet and normal weight. Increased weight leads to problems with blood pressure and diabetes. We check your height, weight, and BMI as part of your yearly visit.  The Body Mass Index or BMI is a way of measuring how much of your body is fat. Having a BMI above 27 increases the risk of heart disease, diabetes, hypertension, stroke and other problems related to obesity. Your caregiver can help determine your BMI and based on it develop an exercise and dietary program to help you achieve or maintain this important measurement at a healthful level.  High blood pressure causes heart and blood vessel problems.  Persistent high blood pressure should be treated with medicine if weight loss and exercise do not work.  We check your blood  pressure as part of your yearly visit.  Avoid drinking alcohol in excess (more than two drinks per day).  Avoid use of street drugs. Do not share needles with anyone. Ask for professional help if you need assistance or instructions on stopping the use of alcohol, cigarettes, and/or drugs.  Brush your teeth twice a day with fluoride toothpaste, and floss once a day. Good oral hygiene prevents tooth decay and gum disease. The problems can be painful, unattractive, and can cause other health problems. Visit your dentist for a routine oral and dental checkup and preventive care every 6-12 months.   See your eye doctor yearly for routine screening for things like glaucoma.  Look at your skin regularly.  Use a mirror to look at your back. Notify your caregivers  of changes in moles, especially if there are changes in shapes, colors, a size larger than a pencil eraser, an irregular border, or development of new moles.  Safety:  Use seatbelts 100% of the time, whether driving or as a passenger.  Use safety devices such as hearing protection if you work in environments with loud noise or significant background noise.  Use safety glasses when doing any work that could send debris in to the eyes.  Use a helmet if you ride a bike or motorcycle.  Use appropriate safety gear for contact sports.  Talk to your caregiver about gun safety.  Use sunscreen with a SPF (or skin protection factor) of 15 or greater.  Lighter skinned people are at a greater risk of skin cancer. Don't forget to also wear sunglasses in order to protect your eyes from too much damaging sunlight. Damaging sunlight can accelerate cataract formation.   If you have multiple sexual partners, or if you are not in a monogamous relationship, practice safe sex. Use condoms. Condoms are used to help reduce the spread of sexually transmitted infections (or STIs).  Consider an HIV test if you have never been tested.  Consider routine screening for STIs if  you have multiple sexual partners.   Keep carbon monoxide and smoke detectors in your home functioning at all times. Change the batteries every 6 months or use a model that plugs into the wall or is hard wired in.   END OF LIFE PLANNING/ADVANCED DIRECTIVES Advance health-care planning is deciding the kind of care you want at the end of life. While alert competent adults are able to exercise their rights to make health care and financial decisions, problems arise when an individual becomes unconscious, incapacitated, or otherwise unable to communicate or make such decisions. Advance health care directives are the legal documents in which you give written instructions about your choices limited, aggressive or palliative care if, in the future, you cannot speak for yourself.  Advanced directives include the following: [place] allows you to appoint someone to act as your health care agent to make health care decisions for you should it be determined by your health care provider that you are no longer able to make these decisions for yourself.  A Living Will is a legal document in which you can declare that under certain conditions you desire your life not be prolonged by extraordinary or artificial means during your last illness or when you are near death. We can provide you with sample advanced directives, you can get an attorney to prepare these for you, or you can visit [place] Secretary of State's website for additional information and resources at [email or web address]  Further, I recommend you have an attorney prepare a Will and Durable Power of Attorney if you haven't done so already.  Please get [place] a copy of your health care Advanced Directives.   PREVENTATIV E CARE RECOMMENDATIONS:  Vaccinations: We recommend the following vaccinations as part of your preventative care:  Pneumococcal vaccine is recommended to protect against certain types of pneumonia.   This is normally recommended for adults age 90 or older once, or up to every 5 years for those at high risk.  The vaccine is also recommended for adults younger than 74 years old with certain underlying conditions that make them high risk for pneumonia.  Influenza vaccine is recommended to protect against seasonal influenza or "the flu." Influenza is a serious disease that can lead to hospitalization and sometimes  even death. Traditional flu vaccines (called trivalent vaccines) are made to protect against three flu viruses; an influenza A (H1N1) virus, an influenza A (H3N2) virus, and an influenza B virus. In addition, there are flu vaccines made to protect against four flu viruses (called "quadrivalent" vaccines). These vaccines protect against the same viruses as the trivalent vaccine and an additional B virus.  We recommend the high dose influenza vaccine to those 65 years and older.  Hepatitis B vaccine to protect against a form of infection of the liver by a virus acquired from blood or body fluids, particularly for high risk groups.  Td or Tdap vaccine to protect against Tetanus, diphtheria and pertussis which can be very serious.  These diseases are caused by bacteria.  Diphtheria and pertussis are spread from person to person through coughing or sneezing.  Tetanus enters the body through cuts, scratches, or wounds.  Tetanus (Lockjaw) causes painful muscle tightening and stiffness, usually all over the body.  Diphtheria can cause a thick coating to form in the back of the throat.  It can lead to breathing problems, paralysis, heart failure, and death.  Pertussis (Whooping Cough) causes severe coughing spells, which can cause difficulty breathing, vomiting and disturbed sleep.  Td or Tdap is usually given every 10 years.  Shingles vaccine to protect against Varicella Zoster if you are older than age 65, or younger than 74 years old with certain underlying illness.    Cancer Screening: Most routine  colon cancer screening begins at the age of 100.  Subsequent colonoscopies are performed either every 5-10 years for normal screening, or every 2-5 years for higher risks patients, up until age 90 years of age. Annual screening is done with easy to use take-home tests to check for hidden blood in the stool called hemoccult tests.  Sigmoidoscopy or colonoscopy can detect the earliest forms of colon cancer and is life saving. These tests use a small camera at the end of a tube to directly examine the colon.   Pelvic Exam and Pap Smear: Pelvic exams and pap smears are performed routinely to evaluate for abnormalities as well as cancers including cervical and vaginal cancers.  This is generally performed every 2-3 years for most women, or more frequently for higher risk patients.  Mammograms: Mammograms are used to screen for breast cancer.  Medicare covers baseline screening once from ages 41-61 years old, but will cover mammograms yearly for those 40 years and older.  In accordance with other guidelines, you may not need a mammogram every year though.  The decision on how frequently you need a mammogram should be discussed with you medical provider.    Osteoporosis Screening: Screening for osteoporosis usually begins at age 43 for women, and can be done as frequent as every 2 years.  However, women or men with higher risk of osteoporosis may be screened earlier than age 58.  Osteoporosis or low bone mass is diminished bone strength from alterations in bone architecture leading to bone fragility and increased fracture risk.     Cardiovascular Screening: Fat and cholesterol leaves deposits in your arteries that can block them. This causes heart disease and vessel disease elsewhere in your body.  If your cholesterol is found to be high, or if you have heart disease or certain other medical conditions, then you may need to have your cholesterol monitored frequently and be treated with medication. Cardiovascular  screening in the form of lab tests for cholesterol, HDL and triglycerides can be done  every 5 years.  A screening electrocardiogram can be done as part of the Welcome to Medicare physical.  Diabetes Screening: Diabetes screening can be done at least every 3 years for those with risk factors,  or every 6-49month for prediabetic patients.  Screening includes fasting blood sugar test or glucose tolerance test.  Risk factors include hypertension, dyslipidemia, obesity, previously abnormal glucose tests, family history of diabetes, age 8776years or older, and history of gestations diabetes.   AAA (abdominal aortic aneurysm) Screening: Medicare allows for a one time ultrasound to screen for abdominal aortic aneurysm if done as a referral as part of the Welcome to Medicare exam.  Men eligible for this screening include those men between age [id number] of age who have smoked at least 100 cigarettes in his lifetime and/or has a family history of AAA.  HIV Screening:  Medicare allows for yearly screening for patients at high risk for contracting HIV disease.

## 2017-01-11 ENCOUNTER — Encounter: Payer: Self-pay | Admitting: Family Medicine

## 2017-01-12 NOTE — Addendum Note (Signed)
Addended by: Minette Headland A on: 01/12/2017 12:55 PM   Modules accepted: Orders

## 2017-01-15 ENCOUNTER — Other Ambulatory Visit: Payer: Self-pay | Admitting: Acute Care

## 2017-01-15 DIAGNOSIS — Z87891 Personal history of nicotine dependence: Secondary | ICD-10-CM

## 2017-01-15 DIAGNOSIS — Z122 Encounter for screening for malignant neoplasm of respiratory organs: Secondary | ICD-10-CM

## 2017-01-15 NOTE — Progress Notes (Signed)
ch

## 2017-01-22 ENCOUNTER — Ambulatory Visit (INDEPENDENT_AMBULATORY_CARE_PROVIDER_SITE_OTHER): Payer: Medicare HMO | Admitting: Acute Care

## 2017-01-22 ENCOUNTER — Ambulatory Visit (INDEPENDENT_AMBULATORY_CARE_PROVIDER_SITE_OTHER)
Admission: RE | Admit: 2017-01-22 | Discharge: 2017-01-22 | Disposition: A | Discharge status: Home / Self Care | Payer: Medicare HMO | Source: Ambulatory Visit | Attending: Acute Care | Admitting: Acute Care

## 2017-01-22 ENCOUNTER — Encounter: Payer: Self-pay | Admitting: Acute Care

## 2017-01-22 DIAGNOSIS — Z87891 Personal history of nicotine dependence: Secondary | ICD-10-CM

## 2017-01-22 DIAGNOSIS — Z122 Encounter for screening for malignant neoplasm of respiratory organs: Secondary | ICD-10-CM

## 2017-01-22 NOTE — Progress Notes (Signed)
Shared Decision Making Visit Lung Cancer Screening Program [phone])   Eligibility:  Age 76 y.o.  Pack Years Smoking History Calculation 43 pack year smoking history (# packs/per year x # years smoked)  Recent History of coughing up blood  no  Unexplained weight loss? no ( >Than 15 pounds within the last 6 months )  Prior History Lung / other cancer no (Diagnosis within the last 5 years already requiring surveillance chest CT Scans).  Smoking Status Former Smoker  Former Smokers: Years since quit: 11 years  Quit Date: 01/22/2017  Visit Components:  Discussion included one or more decision making aids. yes  Discussion included risk/benefits of screening. yes  Discussion included potential follow up diagnostic testing for abnormal scans. yes  Discussion included meaning and risk of over diagnosis. yes  Discussion included meaning and risk of False Positives. yes  Discussion included meaning of total radiation exposure. yes  Counseling Included:  Importance of adherence to annual lung cancer LDCT screening. yes  Impact of comorbidities on ability to participate in the program. yes  Ability and willingness to under diagnostic treatment. yes  Smoking Cessation Counseling:  Current Smokers:   Discussed importance of smoking cessation. NA  Information about tobacco cessation classes and interventions provided to patient. yes  Patient provided with "ticket" for LDCT Scan. yes  Symptomatic Patient. no  CounselingNA  Diagnosis Code: Tobacco Use Z72.0  Asymptomatic Patient yes  Counseling (Intermediate counseling: > three minutes counseling) G9842  Former Smokers:   Discussed the importance of maintaining cigarette abstinence. yes  Diagnosis Code: Personal History of Nicotine Dependence. J03.128  Information about tobacco cessation classes and interventions provided to patient. Yes  Patient provided with "ticket" for LDCT Scan. yes  Written Order for Lung  Cancer Screening with LDCT placed in Epic. Yes (CT Chest Lung Cancer Screening Low Dose W/O CM) [id number] Z12.2-Screening of respiratory organs Z87.891-Personal history of nicotine dependence   I spent 25 minutes of face to face time with Ms. Recht discussing the risks and benefits of lung cancer screening. We viewed a power point together that explained in detail the above noted topics. We took the time to pause the power point at intervals to allow for questions to be asked and answered to ensure understanding. We discussed that she had taken the single most powerful action possible to decrease her risk of developing lung cancer when she quit smoking. I counseled her to remain smoke free, and to contact me if she ever had the desire to smoke again so that I can provide resources and tools to help support the effort to remain smoke free. We discussed the time and location of the scan, and that either  Doroteo Glassman RN or I will call with the results within  24-48 hours of receiving them. She  has my card and contact information in the event she needs to speak with me, in addition to a copy of the power point we reviewed as a resource. She  verbalized understanding of all of the above and had no further questions upon leaving the office.     I explained to the patient that there has been a high incidence of coronary artery disease noted on these exams. I explained that this is a non-gated exam therefore degree or severity cannot be determined. This patient is currently on statin therapy. I have asked the patient to follow-up with their PCP regarding any incidental finding of coronary artery disease and management with diet or medication as they  feel is clinically indicated. The patient verbalized understanding of the above and had no further questions.     Magdalen Spatz, NP 01/22/2017

## 2017-01-24 ENCOUNTER — Other Ambulatory Visit: Payer: Self-pay | Admitting: Acute Care

## 2017-01-24 DIAGNOSIS — Z122 Encounter for screening for malignant neoplasm of respiratory organs: Secondary | ICD-10-CM

## 2017-01-24 DIAGNOSIS — Z87891 Personal history of nicotine dependence: Secondary | ICD-10-CM

## 2017-01-29 DIAGNOSIS — R69 Illness, unspecified: Secondary | ICD-10-CM | POA: Diagnosis not present

## 2017-02-14 ENCOUNTER — Ambulatory Visit (HOSPITAL_COMMUNITY)
Admission: RE | Admit: 2017-02-14 | Discharge: 2017-02-14 | Disposition: A | Discharge status: Home / Self Care | Payer: Medicare HMO | Source: Ambulatory Visit | Attending: Cardiovascular Disease | Admitting: Cardiovascular Disease

## 2017-02-14 DIAGNOSIS — R209 Unspecified disturbances of skin sensation: Secondary | ICD-10-CM | POA: Diagnosis not present

## 2017-02-14 DIAGNOSIS — I10 Essential (primary) hypertension: Secondary | ICD-10-CM | POA: Insufficient documentation

## 2017-02-14 DIAGNOSIS — R208 Other disturbances of skin sensation: Secondary | ICD-10-CM | POA: Insufficient documentation

## 2017-02-14 DIAGNOSIS — Z794 Long term (current) use of insulin: Secondary | ICD-10-CM | POA: Insufficient documentation

## 2017-02-14 DIAGNOSIS — R9389 Abnormal findings on diagnostic imaging of other specified body structures: Secondary | ICD-10-CM | POA: Diagnosis not present

## 2017-02-14 DIAGNOSIS — I7090 Unspecified atherosclerosis: Secondary | ICD-10-CM | POA: Diagnosis not present

## 2017-02-14 DIAGNOSIS — E119 Type 2 diabetes mellitus without complications: Secondary | ICD-10-CM

## 2017-02-14 DIAGNOSIS — E1159 Type 2 diabetes mellitus with other circulatory complications: Secondary | ICD-10-CM | POA: Diagnosis not present

## 2017-02-14 DIAGNOSIS — I152 Hypertension secondary to endocrine disorders: Secondary | ICD-10-CM

## 2017-04-23 DIAGNOSIS — H524 Presbyopia: Secondary | ICD-10-CM | POA: Diagnosis not present

## 2017-04-23 DIAGNOSIS — E119 Type 2 diabetes mellitus without complications: Secondary | ICD-10-CM | POA: Diagnosis not present

## 2017-04-23 DIAGNOSIS — Z961 Presence of intraocular lens: Secondary | ICD-10-CM | POA: Diagnosis not present

## 2017-04-23 DIAGNOSIS — Z794 Long term (current) use of insulin: Secondary | ICD-10-CM | POA: Diagnosis not present

## 2017-04-23 LAB — HM DIABETES EYE EXAM

## 2017-04-25 ENCOUNTER — Other Ambulatory Visit: Payer: Self-pay | Admitting: Family Medicine

## 2017-04-29 DIAGNOSIS — R69 Illness, unspecified: Secondary | ICD-10-CM | POA: Diagnosis not present

## 2017-05-09 ENCOUNTER — Encounter: Payer: Self-pay | Admitting: Family Medicine

## 2017-05-09 NOTE — Progress Notes (Signed)
Subjective:    Patient ID: Jessica Hart, female    DOB: 05/03/1940, 77 y.o.   MRN: [id number]  Jessica Hart is a 77 y.o. female who presents for follow-up of Type 2 diabetes mellitus. She also has other concerns.  States her Losartan has been recalled and we need to switch her to a different medication.  Has been doing fine on the medication. Cannot take ace inhibitors- she had cough and itching with lisinopril.   States she is taking lovastatin for cholesterol and no issues with this.   GERD- Omeprazole 40 mg once daily. She has tried skipping days.   Complains of itching in external vaginal area for the past 2 weeks. Denies vaginal discharge, irritation, odor, lesions.  States this is not a herpes outbreak like she has had in the past.  States she is using Dove body wash sensitive.   Questionable urinary frequency. No urgency, dysuria, leakage.   Patient is checking home blood sugars.   Home blood sugar records: BGs range between 112 and 150 How often is blood sugars being checked: once a day Current symptoms include: nausea. Patient denies visual disturbances, vomiting and weight loss.  Patient is checking their feet daily. Any Foot concerns (callous, ulcer, wound, thickened nails, toenail fungus, skin fungus, hammer toe): none Last dilated eye exam: 2 weeks ago Dr. Gershon Crane   Current treatments: doing well on meds. Tresiba 10 units, Tradjenta, and Metformin  Medication compliance: good  Current diet: in general, a "healthy" diet   Current exercise: none Known diabetic complications: none  The following portions of the patient's history were reviewed and updated as appropriate: allergies, current medications, past medical history, past social history and problem list.  ROS as in subjective above.     Objective:    Physical Exam Alert and in no distress. A fissure on right labia majora (from scratching). No discharge.   Blood pressure 122/78, pulse 81, weight  180 lb 3.2 oz (81.7 kg).  Lab Review Diabetic Labs Latest Ref Rng & Units 05/10/2017 01/10/2017 08/29/2016 05/08/2016 02/29/2016  HbA1c - 6.8% 6.9% 7.8% - 7.2%  Microalbumin Not estab mg/dL - - 3.4 - -  Micro/Creat Ratio <30 mcg/mg creat - - 14 - -  Chol <200 mg/dL - - 142 133 -  HDL >50 mg/dL - - 32(L) 32(L) -  Calc LDL <100 mg/dL - - 82 65 -  Triglycerides <150 mg/dL - - 140 180(H) -  Creatinine 0.60 - 0.93 mg/dL - 0.69 0.79 0.76 -   BP/Weight 05/10/2017 01/10/2017 08/29/2016 02/29/2016 70/10/6281  Systolic BP 662 947 654 650 354  Diastolic BP 78 70 80 80 68  Wt. (Lbs) 180.2 182 183.2 182.4 181.2  BMI 31.42 31.73 31.45 31.31 31.1   Foot/eye exam completion dates Latest Ref Rng & Units 04/23/2017 08/29/2016  Eye Exam No Retinopathy No Retinopathy -  Foot Form Completion - - Done    Jessica Hart  reports that she quit smoking about 11 years ago. She has a 45.00 pack-year smoking history. She quit smokeless tobacco use about 12 years ago. She reports that she drinks alcohol. She reports that she does not use drugs.     Assessment & Plan:    Controlled type 2 diabetes mellitus without complication, unspecified whether long term insulin use ([place]) - Plan: HgB A1c, CBC with Differential/Platelet, Comprehensive metabolic panel, POCT Urinalysis DIP (Proadvantage Device), TSH, Microalbumin / creatinine urine ratio  Hyperlipidemia associated with type 2 diabetes mellitus ([place]) - Plan: Lipid  panel  Gastroesophageal reflux disease, esophagitis presence not specified  HYPERTENSION, BENIGN SYSTEMIC  Vaginal itching - Plan: POCT Urinalysis DIP (Proadvantage Device)  Medication management - Plan: Lipid panel  Urinary frequency - Plan: POCT Urinalysis DIP (Proadvantage Device)  1. Rx changes: none 6.8% and diabetes is well controlled. Continue on current medication regimen.  2. Education: Reviewed 'ABCs' of diabetes management (respective goals in parentheses):  A1C (<7), blood pressure (<130/80), and  cholesterol (LDL <100). 3. Compliance at present is estimated to be good. Efforts to improve compliance (if necessary) will be directed at dietary modifications: continue eating low carb, increased exercise and regular blood sugar monitoring: daily. 4. Losartan has been recalled. Switch her to olmesartan.  5. Continue on statin- will check lipid panel.  6. Follow up: 6 months for AWV and diabetes   She will try Desitin for a barrier and Vagisil for irritation.

## 2017-05-10 ENCOUNTER — Encounter: Payer: Self-pay | Admitting: Family Medicine

## 2017-05-10 ENCOUNTER — Ambulatory Visit (INDEPENDENT_AMBULATORY_CARE_PROVIDER_SITE_OTHER): Payer: Medicare HMO | Admitting: Family Medicine

## 2017-05-10 VITALS — BP 122/78 | HR 81 | Wt 180.2 lb

## 2017-05-10 DIAGNOSIS — E785 Hyperlipidemia, unspecified: Secondary | ICD-10-CM | POA: Diagnosis not present

## 2017-05-10 DIAGNOSIS — N898 Other specified noninflammatory disorders of vagina: Secondary | ICD-10-CM

## 2017-05-10 DIAGNOSIS — E1169 Type 2 diabetes mellitus with other specified complication: Secondary | ICD-10-CM | POA: Diagnosis not present

## 2017-05-10 DIAGNOSIS — K219 Gastro-esophageal reflux disease without esophagitis: Secondary | ICD-10-CM

## 2017-05-10 DIAGNOSIS — R350 Frequency of micturition: Secondary | ICD-10-CM | POA: Diagnosis not present

## 2017-05-10 DIAGNOSIS — Z79899 Other long term (current) drug therapy: Secondary | ICD-10-CM

## 2017-05-10 DIAGNOSIS — E119 Type 2 diabetes mellitus without complications: Secondary | ICD-10-CM

## 2017-05-10 DIAGNOSIS — I10 Essential (primary) hypertension: Secondary | ICD-10-CM

## 2017-05-10 LAB — POCT URINALYSIS DIP (PROADVANTAGE DEVICE)
Bilirubin, UA: NEGATIVE
Glucose, UA: NEGATIVE mg/dL
Ketones, POC UA: NEGATIVE mg/dL
LEUKOCYTES UA: NEGATIVE
NITRITE UA: NEGATIVE
PH UA: 6 (ref 5.0–8.0)
RBC UA: NEGATIVE
Specific Gravity, Urine: 1.02
UUROB: NEGATIVE

## 2017-05-10 LAB — POCT GLYCOSYLATED HEMOGLOBIN (HGB A1C)

## 2017-05-10 MED ORDER — OLMESARTAN MEDOXOMIL 20 MG PO TABS: 20.0000 mg | ORAL_TABLET | Freq: Every day | ORAL | 5 refills | Status: DC

## 2017-05-10 NOTE — Patient Instructions (Addendum)
Your hemoglobin A1c is 6.8% which means your diabetes is under good control.  Continue on your current medication regimen. I will call in a new blood pressure medication to replace the losartan.  Stop taking losartan when you start the new medication.  Keep an eye on your blood pressure to make sure it continues being in normal range.  Try using Desitin on the area that is open and over the counter Vagisil (not the one for yeast) for irritation and itching.  Follow up if these symptoms are not improving.   I will call you with your lab results. Follow up in 6 months for diabetes and Medicare Wellness Visit.

## 2017-05-11 LAB — CBC WITH DIFFERENTIAL/PLATELET
BASOS ABS: 0.1 10*3/uL (ref 0.0–0.2)
Basos: 1 %
EOS (ABSOLUTE): 0.2 10*3/uL (ref 0.0–0.4)
Eos: 2 %
Hematocrit: 41.6 % (ref 34.0–46.6)
Hemoglobin: 13.8 g/dL (ref 11.1–15.9)
IMMATURE GRANS (ABS): 0 10*3/uL (ref 0.0–0.1)
IMMATURE GRANULOCYTES: 0 %
LYMPHS: 29 %
Lymphocytes Absolute: 2.7 10*3/uL (ref 0.7–3.1)
MCH: 27.7 pg (ref 26.6–33.0)
MCHC: 33.2 g/dL (ref 31.5–35.7)
MCV: 83 fL (ref 79–97)
MONOS ABS: 0.9 10*3/uL (ref 0.1–0.9)
Monocytes: 10 %
NEUTROS PCT: 58 %
Neutrophils Absolute: 5.4 10*3/uL (ref 1.4–7.0)
PLATELETS: 337 10*3/uL (ref 150–379)
RBC: 4.99 x10E6/uL (ref 3.77–5.28)
RDW: 15.1 % (ref 12.3–15.4)
WBC: 9.3 10*3/uL (ref 3.4–10.8)

## 2017-05-11 LAB — TSH: TSH: 1.63 u[IU]/mL (ref 0.450–4.500)

## 2017-05-11 LAB — COMPREHENSIVE METABOLIC PANEL
A/G RATIO: 1.4 (ref 1.2–2.2)
ALT: 15 IU/L (ref 0–32)
AST: 20 IU/L (ref 0–40)
Albumin: 4.3 g/dL (ref 3.5–4.8)
Alkaline Phosphatase: 115 IU/L (ref 39–117)
BILIRUBIN TOTAL: 0.2 mg/dL (ref 0.0–1.2)
BUN/Creatinine Ratio: 11 — ABNORMAL LOW (ref 12–28)
BUN: 8 mg/dL (ref 8–27)
CALCIUM: 9.4 mg/dL (ref 8.7–10.3)
CHLORIDE: 107 mmol/L — AB (ref 96–106)
CO2: 20 mmol/L (ref 20–29)
Creatinine, Ser: 0.74 mg/dL (ref 0.57–1.00)
GFR, EST AFRICAN AMERICAN: 92 mL/min/{1.73_m2} (ref >59)
GFR, EST NON AFRICAN AMERICAN: 80 mL/min/{1.73_m2} (ref >59)
GLUCOSE: 115 mg/dL — AB (ref 65–99)
Globulin, Total: 3.1 g/dL (ref 1.5–4.5)
POTASSIUM: 3.5 mmol/L (ref 3.5–5.2)
Sodium: 147 mmol/L — ABNORMAL HIGH (ref 134–144)
TOTAL PROTEIN: 7.4 g/dL (ref 6.0–8.5)

## 2017-05-11 LAB — LIPID PANEL
CHOLESTEROL TOTAL: 124 mg/dL (ref 100–199)
Chol/HDL Ratio: 3.9 ratio (ref 0.0–4.4)
HDL: 32 mg/dL — ABNORMAL LOW (ref >39)
LDL Calculated: 61 mg/dL (ref 0–99)
TRIGLYCERIDES: 155 mg/dL — AB (ref 0–149)
VLDL Cholesterol Cal: 31 mg/dL (ref 5–40)

## 2017-05-23 DIAGNOSIS — E669 Obesity, unspecified: Secondary | ICD-10-CM | POA: Diagnosis not present

## 2017-05-23 DIAGNOSIS — E119 Type 2 diabetes mellitus without complications: Secondary | ICD-10-CM | POA: Diagnosis not present

## 2017-05-23 DIAGNOSIS — H547 Unspecified visual loss: Secondary | ICD-10-CM | POA: Diagnosis not present

## 2017-05-23 DIAGNOSIS — I10 Essential (primary) hypertension: Secondary | ICD-10-CM | POA: Diagnosis not present

## 2017-05-23 DIAGNOSIS — M1990 Unspecified osteoarthritis, unspecified site: Secondary | ICD-10-CM | POA: Diagnosis not present

## 2017-05-23 DIAGNOSIS — Z8249 Family history of ischemic heart disease and other diseases of the circulatory system: Secondary | ICD-10-CM | POA: Diagnosis not present

## 2017-05-23 DIAGNOSIS — E785 Hyperlipidemia, unspecified: Secondary | ICD-10-CM | POA: Diagnosis not present

## 2017-05-23 DIAGNOSIS — K219 Gastro-esophageal reflux disease without esophagitis: Secondary | ICD-10-CM | POA: Diagnosis not present

## 2017-05-23 DIAGNOSIS — Z794 Long term (current) use of insulin: Secondary | ICD-10-CM | POA: Diagnosis not present

## 2017-05-23 DIAGNOSIS — Z7982 Long term (current) use of aspirin: Secondary | ICD-10-CM | POA: Diagnosis not present

## 2017-05-24 ENCOUNTER — Telehealth: Payer: Self-pay | Admitting: Family Medicine

## 2017-05-24 NOTE — Telephone Encounter (Signed)
[facility] and pt's approval period ended 12/31.  New application was completed and given to [facility] for signature and written Rx

## 2017-07-26 ENCOUNTER — Other Ambulatory Visit: Payer: Self-pay | Admitting: Family Medicine

## 2017-07-26 DIAGNOSIS — R69 Illness, unspecified: Secondary | ICD-10-CM | POA: Diagnosis not present

## 2017-08-09 ENCOUNTER — Other Ambulatory Visit: Payer: Self-pay | Admitting: Family Medicine

## 2017-08-09 NOTE — Telephone Encounter (Signed)
Pt has an appt in september

## 2017-09-13 DIAGNOSIS — S0502XA Injury of conjunctiva and corneal abrasion without foreign body, left eye, initial encounter: Secondary | ICD-10-CM | POA: Diagnosis not present

## 2017-09-14 DIAGNOSIS — S0502XD Injury of conjunctiva and corneal abrasion without foreign body, left eye, subsequent encounter: Secondary | ICD-10-CM | POA: Diagnosis not present

## 2017-10-23 DIAGNOSIS — R69 Illness, unspecified: Secondary | ICD-10-CM | POA: Diagnosis not present

## 2017-10-29 ENCOUNTER — Other Ambulatory Visit: Payer: Self-pay | Admitting: Family Medicine

## 2017-10-29 DIAGNOSIS — R69 Illness, unspecified: Secondary | ICD-10-CM | POA: Diagnosis not present

## 2017-10-30 NOTE — Telephone Encounter (Signed)
Pt has an appt on 9/19

## 2017-11-14 ENCOUNTER — Ambulatory Visit: Payer: Medicare HMO | Admitting: Family Medicine

## 2017-11-14 NOTE — Progress Notes (Signed)
Subjective:    Patient ID: Jessica Hart, female    DOB: 07/13/1940, 77 y.o.   MRN: [id number]  Jessica Hart is a 77 y.o. female who presents for follow-up of Type 2 diabetes mellitus and other chronic health conditions.  Reflux- reports having 2 episodes in the past 6 months. Takes omeprazole 2-3 days per week.   HTN- does not check blood pressure. Taking daily. Metoprolol, losartan, amlodipine.   Taking aspirin and no bleeding.  Reports loose stools after eating for the past 5-6 months. No blood seen. No abdominal pain.  Last colonoscopy was 10-11 years ago at [place] per patient. Would like a colonoscopy.   Left eye with a bump and drainage that flares up at times. She has seen Dr. Gershon Crane for this and has eye drops at home. No current issue with this.   Patient is checking home blood sugars.   Home blood sugar records: 112-154  How often is blood sugars being checked: every morning  Current symptoms include: none  Patient denies increased appetite, nausea, visual disturbances and weight loss.  Patient is checking their feet daily. Any Foot concerns (callous, ulcer, wound, thickened nails, toenail fungus, skin fungus, hammer toe): none  Last dilated eye exam: due next year February 2019  Current treatments: doing well on DM meds. Taking Metformin twice daily. Tradjenta once daily. Tresiba 10 units daily.  Medication compliance: good  Misses Tresiba 1-2 times per month. Denies missing any oral medication.  Current diet: in general, a "healthy" diet   Current exercise: none Known diabetic complications: none  The following portions of the patient's history were reviewed and updated as appropriate: allergies, current medications, past medical history, past social history and problem list.  ROS as in subjective above.     Objective:    Physical Exam Alert and in no distress.  Pharyngeal area is normal. Neck is supple without adenopathy or thyromegaly. Cardiac exam  shows a regular sinus rhythm without murmurs or gallops. Lungs are clear to auscultation. Abdomen is soft, non distended, normal BS, non tender, no palpable masses. Extremities without edema, normal pulses. Foot exam shows 2 large corn vs calluses on left plantar surface of left foot. No infection. Skin is warm and dry, no pallor.    Blood pressure 130/70, pulse 82, weight 179 lb 9.6 oz (81.5 kg).  Lab Review Diabetic Labs Latest Ref Rng & Units 11/15/2017 05/10/2017 01/10/2017 08/29/2016 05/08/2016  HbA1c 4.0 - 5.6 % 6.6(A) 6.8% 6.9% 7.8% -  Microalbumin Not estab mg/dL - - - 3.4 -  Micro/Creat Ratio <30 mcg/mg creat - - - 14 -  Chol 100 - 199 mg/dL - 124 - 142 133  HDL >39 mg/dL - 32(L) - 32(L) 32(L)  Calc LDL 0 - 99 mg/dL - 61 - 82 65  Triglycerides 0 - 149 mg/dL - 155(H) - 140 180(H)  Creatinine 0.57 - 1.00 mg/dL - 0.74 0.69 0.79 0.76   BP/Weight 11/15/2017 05/10/2017 01/10/2017 09/04/2954 03/30/3084  Systolic BP 578 469 629 528 413  Diastolic BP 70 78 70 80 80  Wt. (Lbs) 179.6 180.2 182 183.2 182.4  BMI 31.32 31.42 31.73 31.45 31.31   Foot/eye exam completion dates Latest Ref Rng & Units 11/15/2017 04/23/2017  Eye Exam No Retinopathy - No Retinopathy  Foot Form Completion - Done -    Jessica Hart  reports that she quit smoking about 12 years ago. She has a 45.00 pack-year smoking history. She quit smokeless tobacco use about 12 years ago.  She reports that she drinks alcohol. She reports that she does not use drugs.     Assessment & Plan:    Controlled type 2 diabetes mellitus without complication, unspecified whether long term insulin use ([place]) - Plan: CBC with Differential/Platelet, Comprehensive metabolic panel, Microalbumin / creatinine urine ratio, TSH, T4, free, HgB A1c, CANCELED: Hemoglobin A1c  Essential hypertension - Plan: CBC with Differential/Platelet, Comprehensive metabolic panel, T4, free  Hyperlipidemia associated with type 2 diabetes mellitus ([place]) - Plan: Lipid  panel  Loose stools - Plan: CBC with Differential/Platelet, Comprehensive metabolic panel, TSH, T4, free, Ambulatory referral to Gastroenterology  Screen for colon cancer - Plan: Ambulatory referral to Gastroenterology  Corn or callus  Needs flu shot - Plan: Flu vaccine HIGH DOSE PF  1. Rx changes: none Hgb a1c 6.6%  2. Education: Reviewed 'ABCs' of diabetes management (respective goals in parentheses):  A1C (<7), blood pressure (<130/80), and cholesterol (LDL <100). 3. Compliance at present is estimated to be good. Efforts to improve compliance (if necessary) will be directed at dietary modifications: cut back on sweets and carbohydrates, increased exercise and regular blood sugar monitoring: daily. 4. Loose stools after eating with urgency. Does not appear to be related to medication since it is fairly new. She is overdue for her 10 year colonoscopy. Refer back to [place].  5. HTN- well controlled. Continue medication.  6. GERD- she has been able to cut back on PPI use. Encouraged lifestyle modification to control symptoms.  7. Hyperlipidemia- continue statin. Check lipids.  8. She may cut back on daily aspirin. Discussed risk vs benefit of daily use for her age group and no history of MI or CVA.  9. Declines podiatry referral for corns vs calluses.  10. Follow up: 6 months for Diabetes. She does need a wellness visit.

## 2017-11-15 ENCOUNTER — Ambulatory Visit (INDEPENDENT_AMBULATORY_CARE_PROVIDER_SITE_OTHER): Payer: Medicare HMO | Admitting: Family Medicine

## 2017-11-15 ENCOUNTER — Encounter: Payer: Self-pay | Admitting: Family Medicine

## 2017-11-15 VITALS — BP 130/70 | HR 82 | Wt 179.6 lb

## 2017-11-15 DIAGNOSIS — L84 Corns and callosities: Secondary | ICD-10-CM

## 2017-11-15 DIAGNOSIS — Z1211 Encounter for screening for malignant neoplasm of colon: Secondary | ICD-10-CM | POA: Diagnosis not present

## 2017-11-15 DIAGNOSIS — E1169 Type 2 diabetes mellitus with other specified complication: Secondary | ICD-10-CM

## 2017-11-15 DIAGNOSIS — I10 Essential (primary) hypertension: Secondary | ICD-10-CM | POA: Diagnosis not present

## 2017-11-15 DIAGNOSIS — E785 Hyperlipidemia, unspecified: Secondary | ICD-10-CM

## 2017-11-15 DIAGNOSIS — Z23 Encounter for immunization: Secondary | ICD-10-CM | POA: Diagnosis not present

## 2017-11-15 DIAGNOSIS — E119 Type 2 diabetes mellitus without complications: Secondary | ICD-10-CM

## 2017-11-15 DIAGNOSIS — R195 Other fecal abnormalities: Secondary | ICD-10-CM | POA: Diagnosis not present

## 2017-11-15 LAB — POCT GLYCOSYLATED HEMOGLOBIN (HGB A1C): Hemoglobin A1C: 6.6 % — AB (ref 4.0–5.6)

## 2017-11-16 ENCOUNTER — Other Ambulatory Visit: Payer: Self-pay | Admitting: Family Medicine

## 2017-11-16 LAB — COMPREHENSIVE METABOLIC PANEL
A/G RATIO: 1.7 (ref 1.2–2.2)
ALBUMIN: 4.8 g/dL (ref 3.5–4.8)
ALT: 16 IU/L (ref 0–32)
AST: 19 IU/L (ref 0–40)
Alkaline Phosphatase: 109 IU/L (ref 39–117)
BILIRUBIN TOTAL: 0.5 mg/dL (ref 0.0–1.2)
BUN / CREAT RATIO: 11 — AB (ref 12–28)
BUN: 9 mg/dL (ref 8–27)
CALCIUM: 10 mg/dL (ref 8.7–10.3)
CO2: 17 mmol/L — ABNORMAL LOW (ref 20–29)
Chloride: 107 mmol/L — ABNORMAL HIGH (ref 96–106)
Creatinine, Ser: 0.8 mg/dL (ref 0.57–1.00)
GFR, EST AFRICAN AMERICAN: 83 mL/min/{1.73_m2} (ref >59)
GFR, EST NON AFRICAN AMERICAN: 72 mL/min/{1.73_m2} (ref >59)
Globulin, Total: 2.8 g/dL (ref 1.5–4.5)
Glucose: 132 mg/dL — ABNORMAL HIGH (ref 65–99)
POTASSIUM: 4.2 mmol/L (ref 3.5–5.2)
Sodium: 141 mmol/L (ref 134–144)
Total Protein: 7.6 g/dL (ref 6.0–8.5)

## 2017-11-16 LAB — CBC WITH DIFFERENTIAL/PLATELET
BASOS: 1 %
Basophils Absolute: 0.1 10*3/uL (ref 0.0–0.2)
EOS (ABSOLUTE): 0.2 10*3/uL (ref 0.0–0.4)
EOS: 2 %
HEMATOCRIT: 44 % (ref 34.0–46.6)
HEMOGLOBIN: 14.7 g/dL (ref 11.1–15.9)
IMMATURE GRANS (ABS): 0.1 10*3/uL (ref 0.0–0.1)
Immature Granulocytes: 1 %
LYMPHS: 21 %
Lymphocytes Absolute: 2.5 10*3/uL (ref 0.7–3.1)
MCH: 28.7 pg (ref 26.6–33.0)
MCHC: 33.4 g/dL (ref 31.5–35.7)
MCV: 86 fL (ref 79–97)
MONOCYTES: 8 %
Monocytes Absolute: 0.9 10*3/uL (ref 0.1–0.9)
NEUTROS ABS: 7.8 10*3/uL — AB (ref 1.4–7.0)
Neutrophils: 67 %
Platelets: 366 10*3/uL (ref 150–450)
RBC: 5.13 x10E6/uL (ref 3.77–5.28)
RDW: 13.8 % (ref 12.3–15.4)
WBC: 11.4 10*3/uL — ABNORMAL HIGH (ref 3.4–10.8)

## 2017-11-16 LAB — MICROALBUMIN / CREATININE URINE RATIO
Creatinine, Urine: 155.2 mg/dL
MICROALB/CREAT RATIO: 13.2 mg/g{creat} (ref 0.0–30.0)
MICROALBUM., U, RANDOM: 20.5 ug/mL

## 2017-11-16 LAB — LIPID PANEL
CHOLESTEROL TOTAL: 158 mg/dL (ref 100–199)
Chol/HDL Ratio: 4.3 ratio (ref 0.0–4.4)
HDL: 37 mg/dL — AB (ref >39)
LDL Calculated: 64 mg/dL (ref 0–99)
TRIGLYCERIDES: 287 mg/dL — AB (ref 0–149)
VLDL CHOLESTEROL CAL: 57 mg/dL — AB (ref 5–40)

## 2017-11-16 LAB — TSH: TSH: 1.59 u[IU]/mL (ref 0.450–4.500)

## 2017-11-16 LAB — T4, FREE: Free T4: 1.15 ng/dL (ref 0.82–1.77)

## 2017-11-27 ENCOUNTER — Encounter: Payer: Self-pay | Admitting: Gastroenterology

## 2017-12-04 ENCOUNTER — Telehealth: Payer: Self-pay | Admitting: Gastroenterology

## 2017-12-04 ENCOUNTER — Encounter: Payer: Self-pay | Admitting: Gastroenterology

## 2017-12-04 ENCOUNTER — Other Ambulatory Visit: Payer: Medicare HMO

## 2017-12-04 ENCOUNTER — Other Ambulatory Visit: Payer: Self-pay | Admitting: Family Medicine

## 2017-12-04 ENCOUNTER — Ambulatory Visit: Payer: Medicare HMO | Admitting: Gastroenterology

## 2017-12-04 VITALS — BP 150/80 | HR 72 | Ht 63.5 in | Wt 183.0 lb

## 2017-12-04 DIAGNOSIS — K219 Gastro-esophageal reflux disease without esophagitis: Secondary | ICD-10-CM

## 2017-12-04 DIAGNOSIS — K529 Noninfective gastroenteritis and colitis, unspecified: Secondary | ICD-10-CM

## 2017-12-04 DIAGNOSIS — R21 Rash and other nonspecific skin eruption: Secondary | ICD-10-CM | POA: Diagnosis not present

## 2017-12-04 DIAGNOSIS — R110 Nausea: Secondary | ICD-10-CM

## 2017-12-04 MED ORDER — NA SULFATE-K SULFATE-MG SULF 17.5-3.13-1.6 GM/177ML PO SOLN: ORAL | 0 refills | Status: DC

## 2017-12-04 NOTE — Progress Notes (Signed)
LIN HACKMANN    [id number]    Apr 24, 1940  Primary Care Physician:Henson, Laurian Brim, NP-C  Referring Physician: Girtha Rm, NP-C [place], [place] [address]  Chief complaint: Diarrhea  HPI:  77 year old female history of obesity, diabetes, hypertension, GERD, hyperlipidemia with complaints of worsening diarrhea in the past 2 to 3 months.  She has about 5 or 6 loose to semi-formed bowel movements daily and she also wakes up at night few times a week middle of the night to have a bowel movement and also reports having episodes of fecal incontinence intermittently. She is lactose intolerant and tries to drink Lactaid milk but does not avoid cheese or other dairy products. Takes Pepto-Bismol intermittently for indigestion and upper GI symptoms. Denies dysphagia, odynophagia, vomiting, abdominal pain, melena or blood in stool. On review of system patient complaining of vaginal itching and she was rubbing it hard, she feels she may have irritated the area, is currently is oozing and feels raw.   Colonoscopy December 21, 2008 removal of 2 mm diminutive hyperplastic polyp otherwise normal exam   Outpatient Encounter Medications as of 12/04/2017  Medication Sig  . acetaminophen (TYLENOL) 500 MG tablet Take 2 tablets (1,000 mg total) by mouth every 8 (eight) hours as needed. (Patient taking differently: Take 1,000 mg by mouth 2 (two) times daily after a meal. )  . amLODipine (NORVASC) 10 MG tablet TAKE 1 TABLET BY MOUTH ONCE DAILY  . glucose blood (ONETOUCH VERIO) test strip USE ONE STRIP TO CHECK GLUCOSE ONCE DAILY  . insulin degludec (TRESIBA FLEXTOUCH) 100 UNIT/ML SOPN FlexTouch Pen Inject 0.1 mLs (10 Units total) daily after supper into the skin.  . Insulin Pen Needle (PEN NEEDLES 5/16") 30G X 8 MM MISC Pen needles used for lantus injections  . linagliptin (TRADJENTA) 5 MG TABS tablet Take 1 tablet (5 mg total) daily by mouth.  . losartan (COZAAR) 25 MG  tablet TAKE 1 TABLET BY MOUTH ONCE DAILY  . lovastatin (MEVACOR) 20 MG tablet Take 1 tablet (20 mg total) daily by mouth.  . metFORMIN (GLUCOPHAGE) 1000 MG tablet TAKE 1 TABLET BY MOUTH TWICE DAILY WITH A MEAL  . metoprolol tartrate (LOPRESSOR) 100 MG tablet TAKE 1 TABLET BY MOUTH TWICE DAILY  . omeprazole (PRILOSEC) 40 MG capsule Take 1 capsule (40 mg total) daily by mouth.  Glory Rosebush DELICA LANCETS FINE MISC TEST ONCE A DAY  . [DISCONTINUED] aspirin 81 MG EC tablet Take 81 mg by mouth every morning.   . [DISCONTINUED] insulin degludec (TRESIBA FLEXTOUCH) 100 UNIT/ML SOPN FlexTouch Pen Inject 0.1 mLs (10 Units total) daily at 10 pm into the skin.  . [DISCONTINUED] ONETOUCH DELICA LANCETS FINE MISC TEST ONCE A DAY   No facility-administered encounter medications on file as of 12/04/2017.     Allergies as of 12/04/2017 - Review Complete 12/04/2017  Allergen Reaction Noted  . Lisinopril Itching and Cough 05/26/2009    Past Medical History:  Diagnosis Date  . Diabetes mellitus 02/27/2005  . Diabetes mellitus type 2, controlled, without complications ([place]) 03/19/9756   Qualifier: Diagnosis of  By: Drucie Ip    . Echocardiogram findings abnormal, without diagnosis 2005   EF 47%, no ischemia, no infarction  . Former smoker 01/10/2017   45 pack year history, quit in 2007   . History of bone density study 2006   Lt hip = -1.0, L spine = -1.8   . Hyperlipidemia   .  Hypertension     Past Surgical History:  Procedure Laterality Date  . ABDOMINAL HYSTERECTOMY  1989   one ovary remains per patient  . TONSILECTOMY, ADENOIDECTOMY, BILATERAL MYRINGOTOMY AND TUBES  1965    Family History  Problem Relation Age of Onset  . Heart disease Father   . Cancer Sister     Social History   Socioeconomic History  . Marital status: Married    Spouse name: Richard  . Number of children: 1  . Years of education: 49  . Highest education level: Not on file  Occupational History  .  Occupation: Retire-Textile    Employer: RETIRED  Social Needs  . Financial resource strain: Not on file  . Food insecurity:    Worry: Not on file    Inability: Not on file  . Transportation needs:    Medical: Not on file    Non-medical: Not on file  Tobacco Use  . Smoking status: Former Smoker    Packs/day: 1.00    Years: 45.00    Pack years: 45.00    Last attempt to quit: 07/27/2005    Years since quitting: 12.3  . Smokeless tobacco: Former Systems developer    Quit date: 03/2005  Substance and Sexual Activity  . Alcohol use: Yes    Comment: beer occasionally   . Drug use: No  . Sexual activity: Not Currently  Lifestyle  . Physical activity:    Days per week: Not on file    Minutes per session: Not on file  . Stress: Not on file  Relationships  . Social connections:    Talks on phone: Not on file    Gets together: Not on file    Attends religious service: Not on file    Active member of club or organization: Not on file    Attends meetings of clubs or organizations: Not on file    Relationship status: Not on file  . Intimate partner violence:    Fear of current or ex partner: Not on file    Emotionally abused: Not on file    Physically abused: Not on file    Forced sexual activity: Not on file  Other Topics Concern  . Not on file  Social History Narrative   Health Care POA:    Emergency Contact: husband, Rhiana Morash   End of Life Plan:    Who lives with you: Lives with husband   Any pets: gold fish   Diet: Patient has a varied diet and reports stuggeling with portion size and ice cream.   Exercise: Patient does not have currently exercise routine.   Seatbelts: Patient reports wearing seatbelt when in vehicle.   Sun Exposure/Protection: Patient reports not using sun protection.   Hobbies: Bingo            Review of systems: Review of Systems  Constitutional: Negative for fever and chills.  HENT: Negative.   Eyes: Negative for blurred vision.  Respiratory:  Negative for cough, shortness of breath and wheezing.   Cardiovascular: Negative for chest pain and palpitations.  Gastrointestinal: as per HPI Genitourinary: Negative for dysuria, urgency, frequency and hematuria.  Positive for urinary incontinence Musculoskeletal: Negative for myalgias, back pain and joint pain.  Skin: Positive itching and rash in the vulva.  Neurological: Negative for dizziness, tremors, focal weakness, seizures and loss of consciousness.  Endo/Heme/Allergies: Negative for seasonal allergies.  Psychiatric/Behavioral: Negative for depression, suicidal ideas and hallucinations.  All other systems reviewed and are negative.  Physical Exam: Vitals:   12/04/17 0820  BP: (!) 150/80  Pulse: 72   Body mass index is 31.91 kg/m. Gen:      No acute distress HEENT:  EOMI, sclera anicteric Neck:     No masses; no thyromegaly Lungs:    Clear to auscultation bilaterally; normal respiratory effort CV:         Regular rate and rhythm; no murmurs Abd:      + bowel sounds; soft, non-tender; no palpable masses, no distension Ext:    No edema; adequate peripheral perfusion Skin:      Warm and dry; no rash Neuro: alert and oriented x 3 Psych: normal mood and affect Rectal exam: decreased anal sphincter tone, no anal fissure or external hemorrhoids.  vulval skin rash   Data Reviewed:  Reviewed labs, radiology imaging, old records and pertinent past GI work up   Assessment and Plan/Recommendations: 77 year old female with hypertension, diabetes, hyperlipidemia with complaints of diarrhea with increased bowel frequency the past 2 to 3 months after dietary changes  Will do a trial of lactose-free diet for 1 to 2 weeks  Check GI pathogen panel to exclude infectious etiology  Fecal fat and elastase to exclude pancreatic insufficiency  Patient is due for colorectal cancer screening October 2020 but given persistent diarrhea will consider colonoscopy with biopsies to exclude  infectious colitis if above work up unrevealing for etiology The risks and benefits as well as alternatives of endoscopic procedure(s) have been discussed and reviewed. All questions answered. The patient agrees to proceed.  Start Benefiber 1 teaspoon TID with meals  Vaginal itching and vulva rash: skin barrier cream, avoid excessive skin rubbing or hygiene products  GERD: Omeprazole 55m daily, before breakfast Discussed antireflux measures   K. VDenzil Magnuson, MD [phone]   CC: HGirtha Rm NP-C

## 2017-12-04 NOTE — Telephone Encounter (Signed)
Pick up free sample Suprep Kit

## 2017-12-04 NOTE — Telephone Encounter (Signed)
Pt is asking if she can have a prep that is more affordable. Copay for suprep is too expensive.

## 2017-12-04 NOTE — Patient Instructions (Addendum)
Go to the basement for labs today   You have been scheduled for a colonoscopy. Please follow written instructions given to you at your visit today.  Please pick up your prep supplies at the pharmacy within the next 1-3 days. If you use inhalers (even only as needed), please bring them with you on the day of your procedure. Your physician has requested that you go to www.startemmi.com and enter the access code given to you at your visit today. This web site gives a general overview about your procedure. However, you should still follow specific instructions given to you by our office regarding your preparation for the procedure.  Take benefiber 1 teaspoon three times a day with meals   Lactose-Free Diet, Adult If you have lactose intolerance, you are not able to digest lactose. Lactose is a natural sugar found mainly in milk and milk products. You may need to avoid all foods and beverages that contain lactose. A lactose-free diet can help you do this. What do I need to know about this diet?  Do not consume foods, beverages, vitamins, minerals, or medicines with lactose. Read ingredients lists carefully.  Look for the words "lactose-free" on labels.  Use lactase enzyme drops or tablets as directed by your health care provider.  Use lactose-free milk or a milk alternative, such as soy milk, for drinking and cooking.  Make sure you get enough calcium and vitamin D in your diet. A lactose-free eating plan can be lacking in these important nutrients.  Take calcium and vitamin D supplements as directed by your health care provider. Talk to your provider about supplements if you are not able to get enough calcium and vitamin D from food. Which foods have lactose? Lactose is found in:  Milk and foods made from milk.  Yogurt.  Cheese.  Butter.  Margarine.  Sour cream.  Cream.  Whipped toppings and nondairy creamers.  Ice cream and other milk-based desserts.  Lactose is also found  in foods or products made with milk or milk ingredients. To find out whether a food contains milk or a milk ingredient, look at the ingredients list. Avoid foods with the statement "May contain milk" and foods that contain:  Butter.  Cream.  Milk.  Milk solids.  Milk powder.  Whey.  Curd.  Caseinate.  Lactose.  Lactalbumin.  Lactoglobulin.  What are some alternatives to milk and foods made with milk products?  Lactose-free milk.  Soy milk with added calcium and vitamin D.  Almond, coconut, or rice milk with added calcium and vitamin D. Note that these are low in protein.  Soy products, such as soy yogurt, soy cheese, soy ice cream, and soy-based sour cream. Which foods can I eat? Grains Breads and rolls made without milk, such as [place], [place], or [place] bread, bagels, pita, and [employer]. Corn tortillas, corn meal, grits, and polenta. Crackers without lactose or milk solids, such as soda crackers and graham crackers. Cooked or dry cereals without lactose or milk solids. Pasta, quinoa, couscous, barley, oats, bulgur, farro, rice, wild rice, or other grains prepared without milk or lactose. Plain popcorn. Vegetables Fresh, frozen, and canned vegetables without cheese, cream, or butter sauces. Fruits All fresh, canned, frozen, or dried fruits that are not processed with lactose. Meats and Other Protein Sources Plain beef, chicken, fish, [place], lamb, veal, pork, wild game, or ham. Kosher-prepared meat products. Strained or junior meats that do not contain milk. Eggs. Soy meat substitutes. Beans, lentils, and hummus. Tofu. Nuts and  seeds. Peanut or other nut butters without lactose. Soups, casseroles, and mixed dishes without cheese, cream, or milk. Dairy Lactose-free milk. Soy, rice, or almond milk with added calcium and vitamin D. Soy cheese and yogurt. Beverages Carbonated drinks. Tea. Coffee, freeze-dried coffee, and some instant coffees. Fruit and vegetable  juices. Condiments Soy sauce. Carob powder. Olives. Gravy made with water. Baker's cocoa. Angie Fava. Pure seasonings and spices. Ketchup. Mustard. Bouillon. Broth. Sweets and Desserts Water and fruit ices. Gelatin. Cookies, pies, or cakes made from allowed ingredients, such as angel food cake. Pudding made with water or a milk substitute. Lactose-free tofu desserts. Soy, coconut milk, or rice-milk-based frozen desserts. Sugar. Honey. Jam, jelly, and marmalade. Molasses. Pure sugar candy. Dark chocolate without milk. Marshmallows. Fats and Oils Margarines and salad dressings that do not contain milk. Berniece Salines. Vegetable oils. Shortening. Mayonnaise. Soy or coconut-based cream. The items listed above may not be a complete list of recommended foods or beverages. Contact your dietitian for more options. Which foods are not recommended? Grains Breads and rolls that contain milk. Toaster pastries. Muffins, biscuits, waffles, cornbread, and pancakes. These can be prepared at home, commercial, or from mixes. Sweet rolls, donuts, English muffins, fry bread, lefse, flour tortillas with lactose, or [place] toast made with milk or milk ingredients. Crackers that contain lactose. Corn curls. Cooked or dry cereals with lactose. Vegetables Creamed or breaded vegetables. Vegetables in a cheese or butter sauce or with lactose-containing margarines. Instant potatoes. [place] fries. Scalloped or au gratin potatoes. Fruits None. Meats and Other Protein Sources Scrambled eggs, omelets, and souffles that contain milk. Creamed or breaded meat, fish, chicken, or [place]. Sausage products, such as wieners and liver sausage. Cold cuts that contain milk solids. Cheese, cottage cheese, ricotta cheese, and cheese spreads. Lasagna and macaroni and cheese. Pizza. Peanut or other nut butters with added milk solids. Casseroles or mixed dishes containing milk or cheese. Dairy All dairy products, including milk, goat's milk, buttermilk,  kefir, acidophilus milk, flavored milk, evaporated milk, condensed milk, dulce de [place], eggnog, yogurt, cheese, and cheese spreads. Beverages Hot chocolate. Cocoa with lactose. Instant iced teas. Powdered fruit drinks. Smoothies made with milk or yogurt. Condiments Chewing gum that has lactose. Cocoa that has lactose. Spice blends if they contain milk products. Artificial sweeteners that contain lactose. Nondairy creamers. Sweets and Desserts Ice cream, ice milk, gelato, sherbet, and frozen yogurt. Custard, pudding, and mousse. Cake, cream pies, cookies, and other desserts containing milk, cream, cream cheese, or milk chocolate. Pie crust made with milk-containing margarine or butter. Reduced-calorie desserts made with a sugar substitute that contains lactose. Toffee and butterscotch. Milk, white, or dark chocolate that contains milk. Fudge. Caramel. Fats and Oils Margarines and salad dressings that contain milk or cheese. Cream. Half and half. Cream cheese. Sour cream. Chip dips made with sour cream or yogurt. The items listed above may not be a complete list of foods and beverages to avoid. Contact your dietitian for more information. Am I getting enough calcium? Calcium is found in many foods that contain lactose and is important for bone health. The amount of calcium you need depends on your age:  Adults younger than 50 years: 1000 mg of calcium a day.  Adults older than 50 years: 1200 mg of calcium a day.  If you are not getting enough calcium, other calcium sources include:  Orange juice with calcium added. There are 300-350 mg of calcium in 1 cup of orange juice.  Sardines with edible bones. There are 325 mg  of calcium in 3 oz of sardines.  Calcium-fortified soy milk. There are 300-400 mg of calcium in 1 cup of calcium-fortified soy milk.  Calcium-fortified rice or almond milk. There are 300 mg of calcium in 1 cup of calcium-fortified rice or almond milk.  Canned salmon with edible  bones. There are 180 mg of calcium in 3 oz of canned salmon with edible bones.  Calcium-fortified breakfast cereals. There are [phone] mg of calcium in calcium-fortified breakfast cereals.  Tofu set with calcium sulfate. There are 250 mg of calcium in  cup of tofu set with calcium sulfate.  Spinach, cooked. There are 145 mg of calcium in  cup of cooked spinach.  Edamame, cooked. There are 130 mg of calcium in  cup of cooked edamame.  Collard greens, cooked. There are 125 mg of calcium in  cup of cooked collard greens.  Kale, frozen or cooked. There are 90 mg of calcium in  cup of cooked or frozen kale.  Almonds. There are 95 mg of calcium in  cup of almonds.  Broccoli, cooked. There are 60 mg of calcium in 1 cup of cooked broccoli.  This information is not intended to replace advice given to you by your health care provider. Make sure you discuss any questions you have with your health care provider. Document Released: 08/05/2001 Document Revised: 07/22/2015 Document Reviewed: 05/16/2013 Elsevier Interactive Patient Education  2018 [place].   Gastroesophageal Reflux Disease, Adult Normally, food travels down the esophagus and stays in the stomach to be digested. However, when a person has gastroesophageal reflux disease (GERD), food and stomach acid move back up into the esophagus. When this happens, the esophagus becomes sore and inflamed. Over time, GERD can create small holes (ulcers) in the lining of the esophagus. What are the causes? This condition is caused by a problem with the muscle between the esophagus and the stomach (lower esophageal sphincter, or LES). Normally, the LES muscle closes after food passes through the esophagus to the stomach. When the LES is weakened or abnormal, it does not close properly, and that allows food and stomach acid to go back up into the esophagus. The LES can be weakened by certain dietary substances, medicines, and medical conditions,  including:  Tobacco use.  Pregnancy.  Having a hiatal hernia.  Heavy alcohol use.  Certain foods and beverages, such as coffee, chocolate, onions, and peppermint.  What increases the risk? This condition is more likely to develop in:  People who have an increased body weight.  People who have connective tissue disorders.  People who use NSAID medicines.  What are the signs or symptoms? Symptoms of this condition include:  Heartburn.  Difficult or painful swallowing.  The feeling of having a lump in the throat.  Abitter taste in the mouth.  Bad breath.  Having a large amount of saliva.  Having an upset or bloated stomach.  Belching.  Chest pain.  Shortness of breath or wheezing.  Ongoing (chronic) cough or a night-time cough.  Wearing away of tooth enamel.  Weight loss.  Different conditions can cause chest pain. Make sure to see your health care provider if you experience chest pain. How is this diagnosed? Your health care provider will take a medical history and perform a physical exam. To determine if you have mild or severe GERD, your health care provider may also monitor how you respond to treatment. You may also have other tests, including:  An endoscopy toexamine your stomach and esophagus with  a small camera.  A test thatmeasures the acidity level in your esophagus.  A test thatmeasures how much pressure is on your esophagus.  A barium swallow or modified barium swallow to show the shape, size, and functioning of your esophagus.  How is this treated? The goal of treatment is to help relieve your symptoms and to prevent complications. Treatment for this condition may vary depending on how severe your symptoms are. Your health care provider may recommend:  Changes to your diet.  Medicine.  Surgery.  Follow these instructions at home: Diet  Follow a diet as recommended by your health care provider. This may involve avoiding foods and  drinks such as: ? Coffee and tea (with or without caffeine). ? Drinks that containalcohol. ? Energy drinks and sports drinks. ? Carbonated drinks or sodas. ? Chocolate and cocoa. ? Peppermint and mint flavorings. ? Garlic and onions. ? Horseradish. ? Spicy and acidic foods, including peppers, chili powder, curry powder, vinegar, hot sauces, and barbecue sauce. ? Citrus fruit juices and citrus fruits, such as oranges, lemons, and limes. ? Tomato-based foods, such as red sauce, chili, salsa, and pizza with red sauce. ? Fried and fatty foods, such as donuts, french fries, potato chips, and high-fat dressings. ? High-fat meats, such as hot dogs and fatty cuts of red and white meats, such as rib eye steak, sausage, ham, and bacon. ? High-fat dairy items, such as whole milk, butter, and cream cheese.  Eat small, frequent meals instead of large meals.  Avoid drinking large amounts of liquid with your meals.  Avoid eating meals during the 2-3 hours before bedtime.  Avoid lying down right after you eat.  Do not exercise right after you eat. General instructions  Pay attention to any changes in your symptoms.  Take over-the-counter and prescription medicines only as told by your health care provider. Do not take aspirin, ibuprofen, or other NSAIDs unless your health care provider told you to do so.  Do not use any tobacco products, including cigarettes, chewing tobacco, and e-cigarettes. If you need help quitting, ask your health care provider.  Wear loose-fitting clothing. Do not wear anything tight around your waist that causes pressure on your abdomen.  Raise (elevate) the head of your bed 6 inches (15cm).  Try to reduce your stress, such as with yoga or meditation. If you need help reducing stress, ask your health care provider.  If you are overweight, reduce your weight to an amount that is healthy for you. Ask your health care provider for guidance about a safe weight loss  goal.  Keep all follow-up visits as told by your health care provider. This is important. Contact a health care provider if:  You have new symptoms.  You have unexplained weight loss.  You have difficulty swallowing, or it hurts to swallow.  You have wheezing or a persistent cough.  Your symptoms do not improve with treatment.  You have a hoarse voice. Get help right away if:  You have pain in your arms, neck, jaw, teeth, or back.  You feel sweaty, dizzy, or light-headed.  You have chest pain or shortness of breath.  You vomit and your vomit looks like blood or coffee grounds.  You faint.  Your stool is bloody or black.  You cannot swallow, drink, or eat. This information is not intended to replace advice given to you by your health care provider. Make sure you discuss any questions you have with your health care provider. Document Released:  11/23/2004 Document Revised: 07/14/2015 Document Reviewed: 06/10/2014 Elsevier Interactive Patient Education  2018 [place] you for choosing [facility]  Kavitha Nandigam,MD

## 2017-12-07 ENCOUNTER — Encounter: Payer: Self-pay | Admitting: Gastroenterology

## 2017-12-07 ENCOUNTER — Other Ambulatory Visit: Payer: Medicare HMO

## 2017-12-07 DIAGNOSIS — K529 Noninfective gastroenteritis and colitis, unspecified: Secondary | ICD-10-CM | POA: Diagnosis not present

## 2017-12-12 LAB — FECAL FAT, QUALITATIVE
FAT QUAL NEUTRAL STL: NORMAL
FAT QUAL TOTAL STL: NORMAL

## 2017-12-19 LAB — GASTROINTESTINAL PATHOGEN PANEL PCR
C. difficile Tox A/B, PCR: NOT DETECTED
CAMPYLOBACTER, PCR: NOT DETECTED
Cryptosporidium, PCR: NOT DETECTED
E COLI 0157, PCR: NOT DETECTED
E coli (ETEC) LT/ST PCR: NOT DETECTED
E coli (STEC) stx1/stx2, PCR: NOT DETECTED
Giardia lamblia, PCR: NOT DETECTED
Norovirus, PCR: NOT DETECTED
ROTAVIRUS, PCR: NOT DETECTED
SHIGELLA, PCR: NOT DETECTED
Salmonella, PCR: NOT DETECTED

## 2017-12-19 LAB — PANCREATIC ELASTASE, FECAL: PANCREATIC ELASTASE-1, STL: 124 ug/g — AB

## 2017-12-25 ENCOUNTER — Encounter: Payer: Self-pay | Admitting: Gastroenterology

## 2018-01-01 ENCOUNTER — Ambulatory Visit (AMBULATORY_SURGERY_CENTER): Payer: Medicare HMO | Admitting: Gastroenterology

## 2018-01-01 ENCOUNTER — Encounter: Payer: Self-pay | Admitting: Gastroenterology

## 2018-01-01 VITALS — BP 136/63 | HR 63 | Temp 96.4°F | Resp 17 | Ht 63.0 in | Wt 183.0 lb

## 2018-01-01 DIAGNOSIS — K529 Noninfective gastroenteritis and colitis, unspecified: Secondary | ICD-10-CM | POA: Diagnosis not present

## 2018-01-01 DIAGNOSIS — K5730 Diverticulosis of large intestine without perforation or abscess without bleeding: Secondary | ICD-10-CM | POA: Diagnosis not present

## 2018-01-01 DIAGNOSIS — Z1211 Encounter for screening for malignant neoplasm of colon: Secondary | ICD-10-CM | POA: Diagnosis not present

## 2018-01-01 DIAGNOSIS — K648 Other hemorrhoids: Secondary | ICD-10-CM

## 2018-01-01 MED ORDER — SODIUM CHLORIDE 0.9 % IV SOLN: 500.0000 mL | Freq: Once | INTRAVENOUS | Status: DC

## 2018-01-01 NOTE — Progress Notes (Signed)
To PACU, VSS. Report to [email or web address] 

## 2018-01-01 NOTE — Progress Notes (Signed)
Called to room to assist during endoscopic procedure.  Patient ID and intended procedure confirmed with present staff. Received instructions for my participation in the procedure from the performing physician.

## 2018-01-01 NOTE — Patient Instructions (Signed)
YOU HAD AN ENDOSCOPIC PROCEDURE TODAY AT [facility]:   Refer to the procedure report that was given to you for any specific questions about what was found during the examination.  If the procedure report does not answer your questions, please call your gastroenterologist to clarify.  If you requested that your care partner not be given the details of your procedure findings, then the procedure report has been included in a sealed envelope for you to review at your convenience later.  YOU SHOULD EXPECT: Some feelings of bloating in the abdomen. Passage of more gas than usual.  Walking can help get rid of the air that was put into your GI tract during the procedure and reduce the bloating. If you had a lower endoscopy (such as a colonoscopy or flexible sigmoidoscopy) you may notice spotting of blood in your stool or on the toilet paper. If you underwent a bowel prep for your procedure, you may not have a normal bowel movement for a few days.  Please Note:  You might notice some irritation and congestion in your nose or some drainage.  This is from the oxygen used during your procedure.  There is no need for concern and it should clear up in a day or so.  SYMPTOMS TO REPORT IMMEDIATELY:   Following lower endoscopy (colonoscopy or flexible sigmoidoscopy):  Excessive amounts of blood in the stool  Significant tenderness or worsening of abdominal pains  Swelling of the abdomen that is new, acute  Fever of 100F or higher   For urgent or emergent issues, a gastroenterologist can be reached at any hour by calling [phone].   DIET:  We do recommend a small meal at first, but then you may proceed to your regular diet.  Drink plenty of fluids but you should avoid alcoholic beverages for 24 hours.  Try to eat a high fiber diet, and drink plenty of water.  ACTIVITY:  You should plan to take it easy for the rest of today and you should NOT DRIVE or use heavy machinery until tomorrow  (because of the sedation medicines used during the test).    FOLLOW UP: Our staff will call the number listed on your records the next business day following your procedure to check on you and address any questions or concerns that you may have regarding the information given to you following your procedure. If we do not reach you, we will leave a message.  However, if you are feeling well and you are not experiencing any problems, there is no need to return our call.  We will assume that you have returned to your regular daily activities without incident.  If any biopsies were taken you will be contacted by phone or by letter within the next 1-3 weeks.  Please call us at [phone] if you have not heard about the biopsies in 3 weeks.    SIGNATURES/CONFIDENTIALITY: You and/or your care partner have signed paperwork which will be entered into your electronic medical record.  These signatures attest to the fact that that the information above on your After Visit Summary has been reviewed and is understood.  Full responsibility of the confidentiality of this discharge information lies with you and/or your care-partner.  Read all handouts given to you  By your recovery room nurse.

## 2018-01-01 NOTE — Op Note (Signed)
[facility] Patient Name: Jessica Hart Procedure Date: 01/01/2018 7:59 AM MRN: [id number] Endoscopist: Mauri Pole , MD Age: 77 Referring MD:  Date of Birth: 06-24-1940 Gender: Female Account #: [id number] Procedure:                Colonoscopy Indications:              Clinically significant diarrhea of unexplained                            origin Medicines:                Monitored Anesthesia Care Procedure:                Pre-Anesthesia Assessment:                           - Prior to the procedure, a History and Physical                            was performed, and patient medications and                            allergies were reviewed. The patient's tolerance of                            previous anesthesia was also reviewed. The risks                            and benefits of the procedure and the sedation                            options and risks were discussed with the patient.                            All questions were answered, and informed consent                            was obtained. Prior Anticoagulants: The patient has                            taken no previous anticoagulant or antiplatelet                            agents. ASA Grade Assessment: II - A patient with                            mild systemic disease. After reviewing the risks                            and benefits, the patient was deemed in                            satisfactory condition to undergo the procedure.  After obtaining informed consent, the colonoscope                            was passed under direct vision. Throughout the                            procedure, the patient's blood pressure, pulse, and                            oxygen saturations were monitored continuously. The                            Model PCF-[id number]) scope was introduced                            through the anus and advanced to the the  terminal                            ileum, with identification of the appendiceal                            orifice and IC valve. The colonoscopy was performed                            without difficulty. The patient tolerated the                            procedure well. The quality of the bowel                            preparation was good. The terminal ileum, ileocecal                            valve, appendiceal orifice, and rectum were                            photographed. Scope In: 8:02:25 AM Scope Out: 8:20:01 AM Scope Withdrawal Time: 0 hours 12 minutes 42 seconds  Total Procedure Duration: 0 hours 17 minutes 36 seconds  Findings:                 The perianal exam findings include a perianal rash.                            Slightly improved compared to prior.                           Normal mucosa was found in the entire colon.                            Biopsies for histology were taken with a cold                            forceps from the right colon and left colon for  evaluation of microscopic colitis.                           Scattered small and large-mouthed diverticula were                            found in the sigmoid colon.                           Non-bleeding internal hemorrhoids were found during                            retroflexion. The hemorrhoids were medium-sized. Complications:            No immediate complications. Estimated Blood Loss:     Estimated blood loss was minimal. Impression:               - Normal mucosa in the entire examined colon.                            Biopsied.                           - Mild diverticulosis in the sigmoid colon.                           - Non-bleeding internal hemorrhoids. Recommendation:           - Patient has a contact number available for                            emergencies. The signs and symptoms of potential                            delayed complications were  discussed with the                            patient. Return to normal activities tomorrow.                            Written discharge instructions were provided to the                            patient.                           - Resume previous diet.                           - Continue present medications.                           - Await pathology results. If negative for                            microscopic colitis, will do a trial Creon or  Zenpep for 1 week.                           - No repeat colonoscopy due to age.                           - Return to GI clinic at appointment to be                            scheduled in 2-4 weeks. Mauri Pole, MD 01/01/2018 8:25:33 AM This report has been signed electronically.

## 2018-01-02 ENCOUNTER — Telehealth: Payer: Self-pay | Admitting: *Deleted

## 2018-01-02 NOTE — Telephone Encounter (Signed)
  Follow up Call-  Call back number 01/01/2018  Post procedure Call Back phone  # [phone]  Permission to leave phone message Yes  Some recent data might be hidden     Patient questions:  Do you have a fever, pain , or abdominal swelling? No. Pain Score  0 *  Have you tolerated food without any problems? Yes.    Have you been able to return to your normal activities? Yes.    Do you have any questions about your discharge instructions: Diet   No. Medications  No. Follow up visit  No.  Do you have questions or concerns about your Care? No.  Actions: * If pain score is 4 or above: No action needed, pain <4.

## 2018-01-08 ENCOUNTER — Encounter: Payer: Self-pay | Admitting: Gastroenterology

## 2018-01-11 ENCOUNTER — Telehealth: Payer: Self-pay

## 2018-01-11 NOTE — Telephone Encounter (Signed)
Left message to call back for an appointment for follow up with Dr Silverio Decamp. Should let us know if she is having any problems.

## 2018-01-13 NOTE — Progress Notes (Signed)
Jessica Hart is a 77 y.o. female who presents for annual wellness visit, CPE and follow-up on chronic medical conditions.  She has the following concerns:  Anal itching for the past few weeks. She is seeing GI regarding this.    Does not take omeprazole daily, only as needed.   Taking metamucil for diarrhea. Helps to bulk up her stool.   BS at home 120s. No issues with medications. No low BS or hyperglycemia.   History of aortic atherosclerosis and coronary artery calcifications on CT. States she has not seen a cardiologist in 10 years or longer.  ? DOE but no pain with exertion.  Scheduled for follow up low dose CT due to smoking history.   Father died at age 63 from heart disease. Sister with heart disease and has cardiac stents.    Immunization History  Administered Date(s) Administered  . Influenza Whole 12/11/2007  . Influenza, High Dose Seasonal PF 02/29/2016, 01/10/2017, 11/15/2017  . Influenza,inj,Quad PF,6+ Mos 10/29/2012, 04/06/2014, 02/19/2015  . Pneumococcal Conjugate-13 04/06/2014  . Pneumococcal Polysaccharide-23 12/11/2007  . Td 05/28/2004   Last Pap smear: had hysterectomy, last had pelvic exam last year Last mammogram: 2018  Last colonoscopy: 12/2017 Last DEXA: 2018 and normal  Dentist: years  Ophtho: sh[facility] eye Exercise: some days, not often  Other doctors caring for patient include: Dr. Gershon Crane- EYE Dr. Silverio Decamp- GI  Living with her husband    Depression screen:  See questionnaire below.  Depression screen [facility] 2/9 01/14/2018 01/10/2017 12/29/2015 05/13/2015 01/11/2015  Decreased Interest 0 0 0 0 0  Down, Depressed, Hopeless 0 0 0 0 0  PHQ - 2 Score 0 0 0 0 0    Fall Risk Screen: see questionnaire below. Fall Risk  01/14/2018 01/10/2017 12/29/2015 05/13/2015 01/11/2015  Falls in the past year? 0 No No No No  Number falls in past yr: 0 - - - -  Injury with Fall? 0 - - - -    ADL screen:  See questionnaire below Functional Status Survey: Is  the patient deaf or have difficulty hearing?: No Does the patient have difficulty seeing, even when wearing glasses/contacts?: No Does the patient have difficulty concentrating, remembering, or making decisions?: No Does the patient have difficulty walking or climbing stairs?: No Does the patient have difficulty dressing or bathing?: No Does the patient have difficulty doing errands alone such as visiting a doctor's office or shopping?: No   End of Life Discussion:  Patient does not have a living will and medical power of attorney. Husband and son will make her decisions. Is not interested in filling out paperwork.   Review of Systems Constitutional: -fever, -chills, -sweats, -unexpected weight change, -anorexia, -fatigue Allergy: -sneezing, -itching, -congestion Dermatology: denies changing moles, rash, lumps, new worrisome lesions ENT: -runny nose, -ear pain, -sore throat, -hoarseness, -sinus pain, -teeth pain, -tinnitus, -hearing loss, -epistaxis Cardiology:  -chest pain, -palpitations, -edema, -orthopnea, -paroxysmal nocturnal dyspnea Respiratory: -cough, -shortness of breath, -dyspnea on exertion, -wheezing, -hemoptysis Gastroenterology: -abdominal pain, -nausea, -vomiting, -diarrhea, -constipation, -blood in stool, -changes in bowel movement, -dysphagia Hematology: -bleeding or bruising problems Musculoskeletal: -arthralgias, -myalgias, -joint swelling, -back pain, -neck pain, -cramping, -gait changes Ophthalmology: -vision changes, -eye redness, -itching, -discharge Urology: -dysuria, -difficulty urinating, -hematuria, -urinary frequency, -urgency, incontinence Neurology: -headache, -weakness, -tingling, -numbness, -speech abnormality, -memory loss, -falls, -dizziness Psychology:  -depressed mood, -agitation, -sleep problems    PHYSICAL EXAM:  BP 124/70   Pulse 81   Ht _0  (1.575 m)   Wt  182 lb 6.4 oz (82.7 kg)   SpO2 98%   BMI 33.36 kg/m   General Appearance: Alert,  cooperative, no distress, appears stated age Head: Normocephalic, without obvious abnormality, atraumatic Eyes: PERRL, conjunctiva/corneas clear, EOM's intact, fundi benign Ears: Normal TM's and external ear canals Nose: Nares normal, mucosa normal, no drainage or sinus tenderness Throat: Lips, mucosa, and tongue normal; dentures in place, gums normal Neck: Supple, no lymphadenopathy; thyroid: no enlargement/tenderness/nodules; no carotid bruit or JVD Back: Spine nontender, no curvature, ROM normal, no CVA tenderness Lungs: Clear to auscultation bilaterally without wheezes, rales or ronchi; respirations unlabored Chest Wall: No tenderness or deformity Heart: Regular rate and rhythm, S1 and S2 normal, no murmur, rub or gallop Breast Exam: declines Abdomen: Soft, non-tender, nondistended, normoactive bowel sounds, no masses, no hepatosplenomegaly Genitalia: declines. Pap not indicated Extremities: No clubbing, cyanosis or edema Pulses: 2+ and symmetric all extremities Skin: Skin color, texture, turgor normal, no rashes or lesions Lymph nodes: Cervical, supraclavicular, and axillary nodes normal Neurologic: CNII-XII intact, normal strength, sensation and gait; reflexes 2+ and symmetric throughout Psych: Normal mood, affect, hygiene and grooming.  ASSESSMENT/PLAN: Medicare annual wellness visit, subsequent  Gastroesophageal reflux disease, esophagitis presence not specified  HYPERTENSION, BENIGN SYSTEMIC  Atherosclerosis - Plan: Ambulatory referral to Cardiology  Anal itching  Coronary artery calcification seen on CAT scan - Plan: Ambulatory referral to Cardiology  Advance directive declined by patient  Immunization counseling  Screening for breast cancer - Plan: MM DIGITAL SCREENING BILATERAL  Family history of heart disease - Plan: Ambulatory referral to Cardiology  She is doing well overall.  Diabetes is controlled with Hgb A1c 6.6% in September 2019. Due for diabetes check  in 4 months. No changes to medications.  HTN- well controlled on current medication regimen.  GERD- better managed with avoiding lifestyle triggers and is not taking omeprazole daily.  Known coronary artery calcification and aortic atherosclerosis as seen on CT. ?DOE. Family history of heart disease. Last visit with cardiology more than 10 years ago. Refer to cardiology for evaluation and establish care.  Immunization counseling done. She will check with insurance to discuss Tdap and Shingrix.  Counseling on healthy diet and activity.  No falls, memory concerns or depression.  Follow up in 4 months for diabetes.     Discussed monthly self breast exams and yearly mammograms; at least 30 minutes of aerobic activity at least 5 days/week and weight-bearing exercise 2x/week; proper sunscreen use reviewed; healthy diet, including goals of calcium and vitamin D intake and alcohol recommendations (less than or equal to 1 drink/day) reviewed; regular seatbelt use; changing batteries in smoke detectors.  Immunization recommendations discussed.  Colonoscopy recommendations reviewed   Medicare Attestation I have personally reviewed: The patient's medical and social history Their use of alcohol, tobacco or illicit drugs Their current medications and supplements The patient's functional ability including ADLs,fall risks, home safety risks, cognitive, and hearing and visual impairment Diet and physical activities Evidence for depression or mood disorders  The patient's weight, height, and BMI have been recorded in the chart.  I have made referrals, counseling, and provided education to the patient based on review of the above and I have provided the patient with a written personalized care plan for preventive services.     Harland Dingwall, NP-C   01/14/2018

## 2018-01-14 ENCOUNTER — Encounter: Payer: Self-pay | Admitting: Family Medicine

## 2018-01-14 ENCOUNTER — Ambulatory Visit (INDEPENDENT_AMBULATORY_CARE_PROVIDER_SITE_OTHER): Payer: Medicare HMO | Admitting: Family Medicine

## 2018-01-14 ENCOUNTER — Other Ambulatory Visit: Payer: Self-pay

## 2018-01-14 VITALS — BP 124/70 | HR 81 | Ht 62.0 in | Wt 182.4 lb

## 2018-01-14 DIAGNOSIS — Z789 Other specified health status: Secondary | ICD-10-CM

## 2018-01-14 DIAGNOSIS — L290 Pruritus ani: Secondary | ICD-10-CM

## 2018-01-14 DIAGNOSIS — K219 Gastro-esophageal reflux disease without esophagitis: Secondary | ICD-10-CM | POA: Diagnosis not present

## 2018-01-14 DIAGNOSIS — I7090 Unspecified atherosclerosis: Secondary | ICD-10-CM

## 2018-01-14 DIAGNOSIS — Z1239 Encounter for other screening for malignant neoplasm of breast: Secondary | ICD-10-CM

## 2018-01-14 DIAGNOSIS — Z8249 Family history of ischemic heart disease and other diseases of the circulatory system: Secondary | ICD-10-CM

## 2018-01-14 DIAGNOSIS — Z0000 Encounter for general adult medical examination without abnormal findings: Secondary | ICD-10-CM

## 2018-01-14 DIAGNOSIS — I10 Essential (primary) hypertension: Secondary | ICD-10-CM | POA: Diagnosis not present

## 2018-01-14 DIAGNOSIS — I2510 Atherosclerotic heart disease of native coronary artery without angina pectoris: Secondary | ICD-10-CM | POA: Diagnosis not present

## 2018-01-14 DIAGNOSIS — Z7185 Encounter for immunization safety counseling: Secondary | ICD-10-CM

## 2018-01-14 DIAGNOSIS — Z7189 Other specified counseling: Secondary | ICD-10-CM | POA: Diagnosis not present

## 2018-01-14 MED ORDER — PANCRELIPASE (LIP-PROT-AMYL) 36000-114000 UNITS PO CPEP: ORAL_CAPSULE | ORAL | 0 refills | Status: DC

## 2018-01-14 NOTE — Patient Instructions (Signed)
Call your insurance company and find out if the Tdap is covered and affordable. You can also ask about the new shingles vaccine Shingrix. If you decide to get this, let me know and I will give you prescriptions to take to your pharmacy.   Call and schedule your mammogram.   The cardiology office will call you to schedule an appointment.

## 2018-01-14 NOTE — Telephone Encounter (Signed)
Biopsies negative for microscopic colitis. Fecal fat within normal limits but fecal elastase is low suggestive of possible pancreatic insufficiency. Please advise patient to do a trial of Creon X 1 month.  72,000 units with meals 3 times daily and 36,000 units with snacks.  Schedule  follow-up visit next available appointment.  Thanks

## 2018-01-14 NOTE — Telephone Encounter (Signed)
Do you want to try her on Zenpep or Creon based on her biopsy results? She has a follow up appointment 02/08/18.

## 2018-01-15 ENCOUNTER — Other Ambulatory Visit: Payer: Self-pay

## 2018-01-15 MED ORDER — PANCRELIPASE (LIP-PROT-AMYL) 36000-114000 UNITS PO CPEP: ORAL_CAPSULE | ORAL | 0 refills | Status: DC

## 2018-01-15 NOTE — Telephone Encounter (Signed)
Spoke with the patient and explained this to the patient. The co-pay will be $140.00 for the patient. She will pick up the samples we have which will give her 4 days of therapy. She will take the [employer] coupon card to the pharmacy with a printed prescription which will give her 56 capsules. This will give her 11 days of medication. If she is experiencing definite improvement, she will bring the [employer] patient assistance form and required documentation to me to be faxed.

## 2018-01-15 NOTE — Telephone Encounter (Signed)
Ok thank you 

## 2018-01-23 ENCOUNTER — Ambulatory Visit (INDEPENDENT_AMBULATORY_CARE_PROVIDER_SITE_OTHER)
Admission: RE | Admit: 2018-01-23 | Discharge: 2018-01-23 | Disposition: A | Discharge status: Home / Self Care | Payer: Medicare HMO | Source: Ambulatory Visit | Attending: Acute Care | Admitting: Acute Care

## 2018-01-23 DIAGNOSIS — Z87891 Personal history of nicotine dependence: Secondary | ICD-10-CM | POA: Diagnosis not present

## 2018-01-23 DIAGNOSIS — Z122 Encounter for screening for malignant neoplasm of respiratory organs: Secondary | ICD-10-CM

## 2018-01-30 ENCOUNTER — Other Ambulatory Visit: Payer: Self-pay

## 2018-01-30 MED ORDER — PANCRELIPASE (LIP-PROT-AMYL) 36000-114000 UNITS PO CPEP: ORAL_CAPSULE | ORAL | 11 refills | Status: DC

## 2018-02-04 ENCOUNTER — Telehealth: Payer: Self-pay | Admitting: Acute Care

## 2018-02-04 ENCOUNTER — Other Ambulatory Visit: Payer: Self-pay | Admitting: Family Medicine

## 2018-02-04 DIAGNOSIS — Z87891 Personal history of nicotine dependence: Secondary | ICD-10-CM

## 2018-02-04 DIAGNOSIS — Z122 Encounter for screening for malignant neoplasm of respiratory organs: Secondary | ICD-10-CM

## 2018-02-05 NOTE — Telephone Encounter (Signed)
LMTC x 1  

## 2018-02-05 NOTE — Telephone Encounter (Signed)
Patient returning call, CB is [phone].

## 2018-02-05 NOTE — Telephone Encounter (Signed)
LMTC x 1

## 2018-02-06 ENCOUNTER — Ambulatory Visit: Payer: Medicare HMO | Admitting: Gastroenterology

## 2018-02-06 ENCOUNTER — Telehealth: Payer: Self-pay | Admitting: Acute Care

## 2018-02-06 NOTE — Telephone Encounter (Signed)
Patient is requesting results from lung screening. [name] please advise, thank you.

## 2018-02-07 NOTE — Telephone Encounter (Signed)
LMTC x 1  

## 2018-02-07 NOTE — Telephone Encounter (Signed)
Will close this message and refer to open message from 02/04/18

## 2018-02-08 ENCOUNTER — Encounter: Payer: Self-pay | Admitting: Gastroenterology

## 2018-02-08 ENCOUNTER — Ambulatory Visit: Payer: Medicare HMO | Admitting: Gastroenterology

## 2018-02-08 VITALS — BP 170/74 | HR 76 | Ht 63.0 in | Wt 183.4 lb

## 2018-02-08 DIAGNOSIS — K9089 Other intestinal malabsorption: Secondary | ICD-10-CM | POA: Diagnosis not present

## 2018-02-08 DIAGNOSIS — K8689 Other specified diseases of pancreas: Secondary | ICD-10-CM

## 2018-02-08 DIAGNOSIS — R197 Diarrhea, unspecified: Secondary | ICD-10-CM | POA: Diagnosis not present

## 2018-02-08 MED ORDER — COLESTIPOL HCL 1 G PO TABS: 2.0000 g | ORAL_TABLET | Freq: Every day | ORAL | 3 refills | Status: DC

## 2018-02-08 NOTE — Progress Notes (Signed)
Jessica Hart    [id number]    1940-12-01  Primary Care Physician:Henson, Laurian Brim, NP-C  Referring Physician: Girtha Rm, NP-C [place], [place] [id number]  Chief complaint: Diarrhea  HPI: 77 year old female with history of obesity, diabetes, hypertension, hyperlipidemia and GERD here for follow-up visit of diarrhea.   Stool GI pathogen panel negative for Infectious etiology Colonoscopy with biopsies negative for microscopic colitis Fecal elastase 124 suggestive of pancreatic insufficiency Patient was started on pancreatic lipase supplements Creon 72,000 units with meals and 36,000 units with snacks.  Patient reports improvement of diarrhea and is currently having about 3 soft bowel movements daily with decrease fecal urgency.  Outpatient Encounter Medications as of 02/08/2018  Medication Sig  . acetaminophen (TYLENOL) 500 MG tablet Take 2 tablets (1,000 mg total) by mouth every 8 (eight) hours as needed. (Patient taking differently: Take 1,000 mg by mouth 2 (two) times daily after a meal. )  . amLODipine (NORVASC) 10 MG tablet TAKE 1 TABLET BY MOUTH ONCE DAILY  . glucose blood (ONETOUCH VERIO) test strip USE ONE STRIP TO CHECK GLUCOSE ONCE DAILY  . insulin degludec (TRESIBA FLEXTOUCH) 100 UNIT/ML SOPN FlexTouch Pen Inject 0.1 mLs (10 Units total) daily after supper into the skin.  . Insulin Pen Needle (PEN NEEDLES 5/16") 30G X 8 MM MISC Pen needles used for lantus injections  . linagliptin (TRADJENTA) 5 MG TABS tablet Take 1 tablet (5 mg total) daily by mouth.  . lipase/protease/amylase (CREON) 36000 UNITS CPEP capsule Take 2 capsules with each meal three times daily  Take 1 capsule with each snack twice daily  . losartan (COZAAR) 25 MG tablet TAKE 1 TABLET BY MOUTH ONCE DAILY  . lovastatin (MEVACOR) 20 MG tablet TAKE 1 TABLET BY MOUTH ONCE DAILY  . metFORMIN (GLUCOPHAGE) 1000 MG tablet TAKE 1 TABLET BY MOUTH TWICE DAILY WITH A MEAL  .  metoprolol tartrate (LOPRESSOR) 100 MG tablet TAKE 1 TABLET BY MOUTH TWICE DAILY  . omeprazole (PRILOSEC) 40 MG capsule Take 1 capsule (40 mg total) daily by mouth.  Glory Rosebush DELICA LANCETS FINE MISC TEST ONCE A DAY  . tobramycin (TOBREX) 0.3 % ophthalmic solution Place 1 drop into the left eye every morning. Prn for dry eyes   No facility-administered encounter medications on file as of 02/08/2018.     Allergies as of 02/08/2018 - Review Complete 02/08/2018  Allergen Reaction Noted  . Lisinopril Itching and Cough 05/26/2009    Past Medical History:  Diagnosis Date  . Arthritis   . Cataract bilaterl 3 years ago  . Diabetes mellitus 02/27/2005  . Diabetes mellitus type 2, controlled, without complications ([facility]) 11/21/4626   Qualifier: Diagnosis of  By: Drucie Ip    . Diverticulosis   . Echocardiogram findings abnormal, without diagnosis 2005   EF 47%, no ischemia, no infarction  . Former smoker 01/10/2017   45 pack year history, quit in 2007   . GERD (gastroesophageal reflux disease)   . History of bone density study 2006   Lt hip = -1.0, L spine = -1.8   . Hyperlipidemia   . Hypertension     Past Surgical History:  Procedure Laterality Date  . ABDOMINAL HYSTERECTOMY  1989   one ovary remains per patient  . TONSILECTOMY, ADENOIDECTOMY, BILATERAL MYRINGOTOMY AND TUBES  1965    Family History  Problem Relation Age of Onset  . Heart disease Father   . Cancer Sister   .  Colon polyps Neg Hx   . Rectal cancer Neg Hx   . Stomach cancer Neg Hx     Social History   Socioeconomic History  . Marital status: Married    Spouse name: Richard  . Number of children: 1  . Years of education: 39  . Highest education level: Not on file  Occupational History  . Occupation: Retire-Textile    Employer: RETIRED  Social Needs  . Financial resource strain: Not on file  . Food insecurity:    Worry: Not on file    Inability: Not on file  . Transportation needs:    Medical:  Not on file    Non-medical: Not on file  Tobacco Use  . Smoking status: Former Smoker    Packs/day: 1.00    Years: 45.00    Pack years: 45.00    Last attempt to quit: 07/27/2005    Years since quitting: 12.5  . Smokeless tobacco: Former Systems developer    Quit date: 03/2005  Substance and Sexual Activity  . Alcohol use: Yes    Comment: beer occasionally   . Drug use: No  . Sexual activity: Not Currently  Lifestyle  . Physical activity:    Days per week: Not on file    Minutes per session: Not on file  . Stress: Not on file  Relationships  . Social connections:    Talks on phone: Not on file    Gets together: Not on file    Attends religious service: Not on file    Active member of club or organization: Not on file    Attends meetings of clubs or organizations: Not on file    Relationship status: Not on file  . Intimate partner violence:    Fear of current or ex partner: Not on file    Emotionally abused: Not on file    Physically abused: Not on file    Forced sexual activity: Not on file  Other Topics Concern  . Not on file  Social History Narrative   Health Care POA:    Emergency Contact: husband, Kiran Lapine   End of Life Plan:    Who lives with you: Lives with husband   Any pets: gold fish   Diet: Patient has a varied diet and reports stuggeling with portion size and ice cream.   Exercise: Patient does not have currently exercise routine.   Seatbelts: Patient reports wearing seatbelt when in vehicle.   Sun Exposure/Protection: Patient reports not using sun protection.   Hobbies: Bingo            Review of systems: Review of Systems  Constitutional: Negative for fever and chills.  HENT: Negative.   Eyes: Negative for blurred vision.  Respiratory: Negative for cough, shortness of breath and wheezing.   Cardiovascular: Negative for chest pain and palpitations.  Gastrointestinal: as per HPI Genitourinary: Negative for dysuria, urgency, frequency and hematuria.    Musculoskeletal: Negative for myalgias, back pain and joint pain.  Skin: Negative for itching and rash.  Neurological: Negative for dizziness, tremors, focal weakness, seizures and loss of consciousness.  Endo/Heme/Allergies: Negative for seasonal allergies.  Psychiatric/Behavioral: Negative for depression, suicidal ideas and hallucinations.  All other systems reviewed and are negative.   Physical Exam: Vitals:   02/08/18 0934  BP: (!) 170/74  Pulse: 76   Body mass index is 32.48 kg/m. Gen:      No acute distress HEENT:  EOMI, sclera anicteric Neck:     No masses;  no thyromegaly Lungs:    Clear to auscultation bilaterally; normal respiratory effort CV:         Regular rate and rhythm; no murmurs Abd:      + bowel sounds; soft, non-tender; no palpable masses, no distension Ext:    No edema; adequate peripheral perfusion Skin:      Warm and dry; no rash Neuro: alert and oriented x 3 Psych: normal mood and affect  Data Reviewed:  Reviewed labs, radiology imaging, old records and pertinent past GI work up   Assessment and Plan/Recommendations:  77 year old female with multiple comorbidities including diabetes, hypertension, hyperlipidemia, GERD and obesity 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 Greater than 50% of the time used for counseling as well as treatment plan and follow-up. She had multiple questions which were answered to her satisfaction  K. Denzil Magnuson , MD [phone]    CC: Girtha Rm, NP-C

## 2018-02-08 NOTE — Telephone Encounter (Signed)
LMTCB regarding CT results.

## 2018-02-08 NOTE — Telephone Encounter (Signed)
Not my patient but here is the result  I  MPRESSION: 1. Lung-RADS 2S, benign appearance or behavior. Continue annual screening with low-dose chest CT without contrast in 12 months. 2. The "S" modifier above refers to potentially clinically significant non lung cancer related findings. Specifically, there is aortic atherosclerosis, in addition to left main and 3 vessel coronary artery disease. Assessment for potential risk factor modification, dietary therapy or pharmacologic therapy may be warranted, if clinically indicated. 3. Mild diffuse bronchial wall thickening with mild centrilobular and paraseptal emphysema; imaging findings suggestive of underlying COPD.  Aortic Atherosclerosis (ICD10-I70.0) and Emphysema (ICD10-J43.9).   Electronically Signed   By: Mauri Brooklyn.

## 2018-02-08 NOTE — Telephone Encounter (Signed)
Will route to [facility]

## 2018-02-08 NOTE — Telephone Encounter (Signed)
Message was to be routed to [place], [employer] route to [place], [employer]

## 2018-02-08 NOTE — Patient Instructions (Addendum)
We are still in the process of getting you approved for Creon through the patient assistance program   You will take the Zenpep samples we have given you today  Zenpep 20,000 3 each with meals and 1-2 with a snack  We will send Colestid to your pharmacy   If you are age 75 or older, your body mass index should be between 23-30. Your Body mass index is 32.48 kg/m. If this is out of the aforementioned range listed, please consider follow up with your Primary Care Provider.  If you are age 71 or younger, your body mass index should be between 19-25. Your Body mass index is 32.48 kg/m. If this is out of the aformentioned range listed, please consider follow up with your Primary Care Provider.    Follow up in 3 months   Thank you for choosing [facility]  Karleen Hampshire Nandigam,MD

## 2018-02-08 NOTE — Telephone Encounter (Signed)
Pt is calling back [phone]

## 2018-02-10 ENCOUNTER — Encounter: Payer: Self-pay | Admitting: Gastroenterology

## 2018-02-11 ENCOUNTER — Encounter: Payer: Self-pay | Admitting: *Deleted

## 2018-02-11 NOTE — Telephone Encounter (Signed)
She is scheduled for cardiology consult on 02/18/2018 due to CAD on CT and family history.

## 2018-02-11 NOTE — Telephone Encounter (Signed)
LMOM for pt to let her know I am mailing her results to her since we have been unable to contact her by phone.  Nothing further needed at this time.

## 2018-02-17 NOTE — Progress Notes (Signed)
Cardiology Office Note:    Date:  02/18/2018   ID:  Jessica Hart, DOB Jul 26, 1940, MRN [id number]  PCP:  Girtha Rm, NP-C  Cardiologist:  No primary care provider on file.  Electrophysiologist:  None   Referring MD: Girtha Rm, NP-C   Coronary artery calcium   History of Present Illness:    Jessica Hart is a 77 y.o. female with a hx of diabetes mellitus, hypertension and hyperlipidemia who presents today for CAC on lung cancer screening CT. She is a former smoker 28 pk yr.   She used to exercise at the [facility] and do jazzercise. She would occasionally have a mild heaviness on her chest, but she tells me that when she exercises it goes away if she pushes through it.  She has not been as active recently. She notes if she walks at an incline she will have mild dyspnea.   The patient denies dyspnea at rest, palpitations, PND, orthopnea, or leg swelling. Denies syncope or presyncope. Denies dizziness or lightheadedness. Epworth sleepiness scale score is 4.  Past Medical History:  Diagnosis Date  . Arthritis   . Cataract bilaterl 3 years ago  . Diabetes mellitus 02/27/2005  . Diabetes mellitus type 2, controlled, without complications ([place]) 2/63/3354   Qualifier: Diagnosis of  By: Drucie Ip    . Diverticulosis   . Echocardiogram findings abnormal, without diagnosis 2005   EF 47%, no ischemia, no infarction  . Former smoker 01/10/2017   45 pack year history, quit in 2007   . GERD (gastroesophageal reflux disease)   . History of bone density study 2006   Lt hip = -1.0, L spine = -1.8   . Hyperlipidemia   . Hypertension     Past Surgical History:  Procedure Laterality Date  . ABDOMINAL HYSTERECTOMY  1989   one ovary remains per patient  . TONSILECTOMY, ADENOIDECTOMY, BILATERAL MYRINGOTOMY AND TUBES  1965    Current Medications: Current Meds  Medication Sig  . acetaminophen (TYLENOL) 500 MG tablet Take 2 tablets (1,000 mg total) by mouth every 8  (eight) hours as needed. (Patient taking differently: Take 1,000 mg by mouth 2 (two) times daily after a meal. )  . amLODipine (NORVASC) 10 MG tablet TAKE 1 TABLET BY MOUTH ONCE DAILY  . glucose blood (ONETOUCH VERIO) test strip USE ONE STRIP TO CHECK GLUCOSE ONCE DAILY  . insulin degludec (TRESIBA FLEXTOUCH) 100 UNIT/ML SOPN FlexTouch Pen Inject 0.1 mLs (10 Units total) daily after supper into the skin.  . Insulin Pen Needle (PEN NEEDLES 5/16") 30G X 8 MM MISC Pen needles used for lantus injections  . linagliptin (TRADJENTA) 5 MG TABS tablet Take 1 tablet (5 mg total) daily by mouth.  . lipase/protease/amylase (CREON) 36000 UNITS CPEP capsule Take 2 capsules with each meal three times daily  Take 1 capsule with each snack twice daily  . losartan (COZAAR) 25 MG tablet TAKE 1 TABLET BY MOUTH ONCE DAILY  . lovastatin (MEVACOR) 20 MG tablet TAKE 1 TABLET BY MOUTH ONCE DAILY  . metFORMIN (GLUCOPHAGE) 1000 MG tablet TAKE 1 TABLET BY MOUTH TWICE DAILY WITH A MEAL  . metoprolol tartrate (LOPRESSOR) 100 MG tablet TAKE 1 TABLET BY MOUTH TWICE DAILY  . omeprazole (PRILOSEC) 40 MG capsule Take 1 capsule (40 mg total) daily by mouth.  Glory Rosebush DELICA LANCETS FINE MISC TEST ONCE A DAY  . tobramycin (TOBREX) 0.3 % ophthalmic solution Place 1 drop into the left eye every morning. Prn for  dry eyes     Allergies:   Lisinopril   Social History   Socioeconomic History  . Marital status: Married    Spouse name: Richard  . Number of children: 1  . Years of education: 26  . Highest education level: Not on file  Occupational History  . Occupation: Retire-Textile    Employer: RETIRED  Social Needs  . Financial resource strain: Not on file  . Food insecurity:    Worry: Not on file    Inability: Not on file  . Transportation needs:    Medical: Not on file    Non-medical: Not on file  Tobacco Use  . Smoking status: Former Smoker    Packs/day: 1.00    Years: 45.00    Pack years: 45.00    Last attempt  to quit: 07/27/2005    Years since quitting: 12.5  . Smokeless tobacco: Former Systems developer    Quit date: 03/2005  Substance and Sexual Activity  . Alcohol use: Yes    Comment: beer occasionally   . Drug use: No  . Sexual activity: Not Currently  Lifestyle  . Physical activity:    Days per week: Not on file    Minutes per session: Not on file  . Stress: Not on file  Relationships  . Social connections:    Talks on phone: Not on file    Gets together: Not on file    Attends religious service: Not on file    Active member of club or organization: Not on file    Attends meetings of clubs or organizations: Not on file    Relationship status: Not on file  Other Topics Concern  . Not on file  Social History Narrative   Health Care POA:    Emergency Contact: husband, Rosa Wyly   End of Life Plan:    Who lives with you: Lives with husband   Any pets: gold fish   Diet: Patient has a varied diet and reports stuggeling with portion size and ice cream.   Exercise: Patient does not have currently exercise routine.   Seatbelts: Patient reports wearing seatbelt when in vehicle.   Sun Exposure/Protection: Patient reports not using sun protection.   Hobbies: Bingo          Family History: The patient's family history includes Cancer in her sister; Heart disease in her father; Tongue cancer in her sister. There is no history of Colon polyps, Rectal cancer, or Stomach cancer.  ROS:   Please see the history of present illness.    All other systems reviewed and are negative.  EKGs/Labs/Other Studies Reviewed:    The following studies were reviewed today:   EKG: Normal sinus rhythm, nonspecific T wave abnormality leads II III III, aVL, V3 V4.  Ventricular rate 76 bpm, QRS duration 88 ms.  Recent Labs: 11/15/2017: ALT 16; BUN 9; Creatinine, Ser 0.80; Hemoglobin 14.7; Platelets 366; Potassium 4.2; Sodium 141; TSH 1.590  Recent Lipid Panel    Component Value Date/Time   CHOL 158 11/15/2017  1020   TRIG 287 (H) 11/15/2017 1020   HDL 37 (L) 11/15/2017 1020   CHOLHDL 4.3 11/15/2017 1020   CHOLHDL 4.4 08/29/2016 0920   VLDL 28 08/29/2016 0920   LDLCALC 64 11/15/2017 1020   LDLDIRECT 81 03/21/2013 1431    Physical Exam:    VS:  BP (!) 166/80   Pulse 78   Ht _0  (1.6 m)   Wt 184 lb (83.5 kg)   BMI  32.59 kg/m     Wt Readings from Last 3 Encounters:  02/18/18 184 lb (83.5 kg)  02/08/18 183 lb 6 oz (83.2 kg)  01/14/18 182 lb 6.4 oz (82.7 kg)     Constitutional: No acute distress Eyes: pupils equally round and reactive to light, sclera non-icteric, normal conjunctiva and lids ENMT: normal dentition, moist mucous membranes Cardiovascular: regular rhythm, normal rate, no murmurs. S1 and S2 normal. Radial pulses normal bilaterally. No jugular venous distention.  Respiratory: clear to auscultation bilaterally GI : normal bowel sounds, soft and nontender. No distention.   MSK: extremities warm, well perfused. No edema.  NEURO: grossly nonfocal exam, moves all extremities. PSYCH: alert and oriented x 3, normal mood and affect.    ASSESSMENT:    1. Precordial pain   2. Elevated coronary artery calcium score    PLAN:    I have independently reviewed her CT Chest with coronary calcifications. She has 3 vessel distribution of CAC. She requires a functional assessment of her CAD with description of occasional chest pressure with exertion. She informs me that she was unable to perform to capacity on a treadmill stress in the past, and did not tolerate regadenason due to significant symptoms during the test. Ideally, I would like to perform a treadmill test. However, we will first perform a CTA coronaries with FFR, and if there are any concerning or equivocal results, we will pursue further functional testing.   I will see her in 3 mo follow up to discuss further management of blood pressure and CV risk factors.   Medication Adjustments/Labs and Tests Ordered: Current  medicines are reviewed at length with the patient today.  Concerns regarding medicines are outlined above.  Orders Placed This Encounter  Procedures  . CT CORONARY MORPH W/CTA COR W/SCORE W/CA W/CM &/OR WO/CM  . CT CORONARY FRACTIONAL FLOW RESERVE DATA PREP  . CT CORONARY FRACTIONAL FLOW RESERVE FLUID ANALYSIS  . Basic metabolic panel  . EKG 12-Lead  . ECHOCARDIOGRAM COMPLETE   No orders of the defined types were placed in this encounter.   Patient Instructions  Medication Instructions:  Your physician recommends that you continue on your current medications as directed. Please refer to the Current Medication list given to you today.  If you need a refill on your cardiac medications before your next appointment, please call your pharmacy.   Lab work: Your physician recommends that you return for lab work a 2-3 days prior to your Cardiac CT  If you have labs (blood work) drawn today and your tests are completely normal, you will receive your results only by: Marland Kitchen MyChart Message (if you have MyChart) OR . A paper copy in the mail If you have any lab test that is abnormal or we need to change your treatment, we will call you to review the results.  Testing/Procedures: Your physician has requested that you have an echocardiogram. Echocardiography is a painless test that uses sound waves to create images of your heart. It provides your doctor with information about the size and shape of your heart and how well your heart's chambers and valves are working. This procedure takes approximately one hour. There are no restrictions for this procedure.   Your physician has requested that you have cardiac CT. Cardiac computed tomography (CT) is a painless test that uses an x-ray machine to take clear, detailed pictures of your heart. For further information please visit HugeFiesta.tn. Please follow instruction sheet as given.     Follow-Up: At  [facility], you and your health needs are  our priority.  As part of our continuing mission to provide you with exceptional heart care, we have created designated Provider Care Teams.  These Care Teams include your primary Cardiologist (physician) and Advanced Practice Providers (APPs -  Physician Assistants and Nurse Practitioners) who all work together to provide you with the care you need, when you need it. You will need a follow up appointment in 3 months.  You may see Dr.Marimar Suber or one of the following Advanced Practice Providers on your designated Care Team:   Rosaria Ferries, PA-C . Jory Sims, DNP, ANP  Any Other Special Instructions Will Be Listed Below (If Applicable).  Please arrive at the [facility] main entrance of [facility] at TBD AM (30-45 minutes prior to test start time)  [facility] [place], [place] [address] [phone]  Proceed to the [facility] Radiology Department (First Floor).  Please follow these instructions carefully (unless otherwise directed):   On the Night Before the Test: . Be sure to Drink plenty of water. . Do not consume any caffeinated/decaffeinated beverages or chocolate 12 hours prior to your test. . Do not take any antihistamines 12 hours prior to your test. . If you take Metformin do not take 24 hours prior to test.  On the Day of the Test: . Drink plenty of water. Do not drink any water within one hour of the test. . Do not eat any food 4 hours prior to the test. . You may take your regular medications prior to the test.  . Take metoprolol (Lopressor) two hours prior to test.        After the Test: . Drink plenty of water. . After receiving IV contrast, you may experience a mild flushed feeling. This is normal. . On occasion, you may experience a mild rash up to 24 hours after the test. This is not dangerous. If this occurs, you can take Benadryl 25 mg and increase your fluid intake. . If you experience trouble breathing, this can be  serious. If it is severe call 911 IMMEDIATELY. If it is mild, please call our office. . If you take any of these medications: Glipizide/Metformin, Avandament, Glucavance, please do not take 48 hours after completing test.      Signed, Elouise Munroe, MD  02/18/2018 4:23 PM    [place]

## 2018-02-18 ENCOUNTER — Ambulatory Visit: Payer: Medicare HMO | Admitting: Internal Medicine

## 2018-02-18 ENCOUNTER — Encounter: Payer: Self-pay | Admitting: Internal Medicine

## 2018-02-18 VITALS — BP 166/80 | HR 78 | Ht 63.0 in | Wt 184.0 lb

## 2018-02-18 DIAGNOSIS — I10 Essential (primary) hypertension: Secondary | ICD-10-CM | POA: Diagnosis not present

## 2018-02-18 DIAGNOSIS — R931 Abnormal findings on diagnostic imaging of heart and coronary circulation: Secondary | ICD-10-CM | POA: Diagnosis not present

## 2018-02-18 DIAGNOSIS — E1159 Type 2 diabetes mellitus with other circulatory complications: Secondary | ICD-10-CM

## 2018-02-18 DIAGNOSIS — Z794 Long term (current) use of insulin: Secondary | ICD-10-CM

## 2018-02-18 DIAGNOSIS — E119 Type 2 diabetes mellitus without complications: Secondary | ICD-10-CM

## 2018-02-18 DIAGNOSIS — I152 Hypertension secondary to endocrine disorders: Secondary | ICD-10-CM

## 2018-02-18 DIAGNOSIS — R072 Precordial pain: Secondary | ICD-10-CM | POA: Diagnosis not present

## 2018-02-18 NOTE — Patient Instructions (Addendum)
Medication Instructions:  Your physician recommends that you continue on your current medications as directed. Please refer to the Current Medication list given to you today.  If you need a refill on your cardiac medications before your next appointment, please call your pharmacy.   Lab work: Your physician recommends that you return for lab work a 2-3 days prior to your Cardiac CT  If you have labs (blood work) drawn today and your tests are completely normal, you will receive your results only by: Marland Kitchen MyChart Message (if you have MyChart) OR . A paper copy in the mail If you have any lab test that is abnormal or we need to change your treatment, we will call you to review the results.  Testing/Procedures: Your physician has requested that you have an echocardiogram. Echocardiography is a painless test that uses sound waves to create images of your heart. It provides your doctor with information about the size and shape of your heart and how well your heart's chambers and valves are working. This procedure takes approximately one hour. There are no restrictions for this procedure.   Your physician has requested that you have cardiac CT. Cardiac computed tomography (CT) is a painless test that uses an x-ray machine to take clear, detailed pictures of your heart. For further information please visit HugeFiesta.[email or web address]. Please follow instruction sheet as given.     Follow-Up: At [facility], you and your health needs are our priority.  As part of our continuing mission to provide you with exceptional heart care, we have created designated Provider Care Teams.  These Care Teams include your primary Cardiologist (physician) and Advanced Practice Providers (APPs -  Physician Assistants and Nurse Practitioners) who all work together to provide you with the care you need, when you need it. You will need a follow up appointment in 3 months.  You may see Dr.Acharya or one of the following Advanced  Practice Providers on your designated Care Team:   Rosaria Ferries, PA-C . Jory Sims, DNP, ANP  Any Other Special Instructions Will Be Listed Below (If Applicable).  Please arrive at the [facility] main entrance of [facility] at TBD AM (30-45 minutes prior to test start time)  [facility] [place], [place] [address] [phone]  Proceed to the [facility] Radiology Department (First Floor).  Please follow these instructions carefully (unless otherwise directed):   On the Night Before the Test: . Be sure to Drink plenty of water. . Do not consume any caffeinated/decaffeinated beverages or chocolate 12 hours prior to your test. . Do not take any antihistamines 12 hours prior to your test. . If you take Metformin do not take 24 hours prior to test.  On the Day of the Test: . Drink plenty of water. Do not drink any water within one hour of the test. . Do not eat any food 4 hours prior to the test. . You may take your regular medications prior to the test.  . Take metoprolol (Lopressor) two hours prior to test.        After the Test: . Drink plenty of water. . After receiving IV contrast, you may experience a mild flushed feeling. This is normal. . On occasion, you may experience a mild rash up to 24 hours after the test. This is not dangerous. If this occurs, you can take Benadryl 25 mg and increase your fluid intake. . If you experience trouble breathing, this can be serious. If it  is severe call 911 IMMEDIATELY. If it is mild, please call our office. . If you take any of these medications: Glipizide/Metformin, Avandament, Glucavance, please do not take 48 hours after completing test.

## 2018-02-22 ENCOUNTER — Other Ambulatory Visit: Payer: Self-pay | Admitting: Family Medicine

## 2018-02-22 DIAGNOSIS — R931 Abnormal findings on diagnostic imaging of heart and coronary circulation: Secondary | ICD-10-CM | POA: Diagnosis not present

## 2018-02-22 DIAGNOSIS — R072 Precordial pain: Secondary | ICD-10-CM | POA: Diagnosis not present

## 2018-02-23 LAB — BASIC METABOLIC PANEL
BUN/Creatinine Ratio: 11 — ABNORMAL LOW (ref 12–28)
BUN: 8 mg/dL (ref 8–27)
CHLORIDE: 105 mmol/L (ref 96–106)
CO2: 20 mmol/L (ref 20–29)
Calcium: 9.7 mg/dL (ref 8.7–10.3)
Creatinine, Ser: 0.73 mg/dL (ref 0.57–1.00)
GFR calc Af Amer: 93 mL/min/{1.73_m2} (ref >59)
GFR calc non Af Amer: 81 mL/min/{1.73_m2} (ref >59)
Glucose: 162 mg/dL — ABNORMAL HIGH (ref 65–99)
Potassium: 4.1 mmol/L (ref 3.5–5.2)
Sodium: 141 mmol/L (ref 134–144)

## 2018-02-26 ENCOUNTER — Ambulatory Visit (HOSPITAL_COMMUNITY): Payer: Medicare HMO | Attending: Cardiology

## 2018-02-26 ENCOUNTER — Other Ambulatory Visit: Payer: Self-pay

## 2018-02-26 DIAGNOSIS — R072 Precordial pain: Secondary | ICD-10-CM | POA: Diagnosis not present

## 2018-03-05 ENCOUNTER — Telehealth: Payer: Self-pay | Admitting: *Deleted

## 2018-03-05 ENCOUNTER — Telehealth: Payer: Self-pay | Admitting: Family Medicine

## 2018-03-05 ENCOUNTER — Other Ambulatory Visit: Payer: Self-pay | Admitting: Family Medicine

## 2018-03-05 NOTE — Telephone Encounter (Signed)
PT ASSISTANCE TRADJENTA application completed and faxed

## 2018-03-05 NOTE — Telephone Encounter (Signed)
[employer] approved for patient assistance program through 02/27/2019  Sent approval to be scanned in

## 2018-03-13 ENCOUNTER — Telehealth: Payer: Self-pay

## 2018-03-13 ENCOUNTER — Telehealth: Payer: Self-pay | Admitting: Family Medicine

## 2018-03-13 MED ORDER — LOVASTATIN 20 MG PO TABS: 20.0000 mg | ORAL_TABLET | Freq: Every day | ORAL | 0 refills | Status: DC

## 2018-03-13 NOTE — Telephone Encounter (Signed)
Pt needs refill Lovastatin 16m #[phone] [facility]

## 2018-03-13 NOTE — Telephone Encounter (Signed)
done

## 2018-03-13 NOTE — Telephone Encounter (Signed)
-----  Message from Elouise Munroe, MD sent at 03/12/2018  5:38 PM EST ----- Normal pump function of the heart with normal valve function.

## 2018-03-13 NOTE — Telephone Encounter (Signed)
Pt aware of echo results with verbal understanding.

## 2018-03-18 NOTE — Telephone Encounter (Signed)
Pt Assistance approved til 02/27/19, pt informed and she orders the refills.

## 2018-03-20 ENCOUNTER — Telehealth (HOSPITAL_COMMUNITY): Payer: Self-pay | Admitting: Emergency Medicine

## 2018-03-20 NOTE — Telephone Encounter (Signed)
Reaching out to patient to offer assistance regarding upcoming cardiac imaging study; pt verbalizes understanding of appt date/time, parking situation and where to check in, pre-test NPO status and medications ordered, and verified current allergies; name and call back number provided for further questions should they arise Evert Wenrich RN Navigator Cardiac Imaging  Heart and Vascular [phone] office [phone] cell 

## 2018-03-21 ENCOUNTER — Ambulatory Visit (HOSPITAL_COMMUNITY)
Admission: RE | Admit: 2018-03-21 | Discharge: 2018-03-21 | Disposition: A | Discharge status: Home / Self Care | Payer: Medicare HMO | Source: Ambulatory Visit | Attending: Internal Medicine | Admitting: Internal Medicine

## 2018-03-21 ENCOUNTER — Encounter (HOSPITAL_COMMUNITY): Payer: Self-pay

## 2018-03-21 DIAGNOSIS — R931 Abnormal findings on diagnostic imaging of heart and coronary circulation: Secondary | ICD-10-CM | POA: Diagnosis present

## 2018-03-21 DIAGNOSIS — R072 Precordial pain: Secondary | ICD-10-CM | POA: Insufficient documentation

## 2018-03-21 MED ORDER — METOPROLOL TARTRATE 5 MG/5ML IV SOLN
5.0000 mg | INTRAVENOUS | Status: DC | PRN
  Administered 2018-03-21 (×3): 5 mg via INTRAVENOUS

## 2018-03-21 MED ORDER — NITROGLYCERIN 0.4 MG SL SUBL
SUBLINGUAL_TABLET | SUBLINGUAL | Status: AC
  Administered 2018-03-21: 0.8 mg via SUBLINGUAL
  Filled 2018-03-21: qty 2

## 2018-03-21 MED ORDER — METOPROLOL TARTRATE 5 MG/5ML IV SOLN
INTRAVENOUS | Status: AC
  Administered 2018-03-21: 5 mg via INTRAVENOUS
  Filled 2018-03-21: qty 20

## 2018-03-21 MED ORDER — NITROGLYCERIN 0.4 MG SL SUBL
0.8000 mg | SUBLINGUAL_TABLET | Freq: Once | SUBLINGUAL | Status: AC
  Administered 2018-03-21: 0.8 mg via SUBLINGUAL

## 2018-03-21 NOTE — Progress Notes (Signed)
Took her Metoprolol 173m 2 hours prior to test. Heart rate 77 on arrival. Given additional 15 mg of Metoprolol IV and heart rate dropped to 65 and lower with breath hold. Initial scan done after nitro given. Heart rate increased to 77 with no lowering of heart rate with breath hold. Dr MAundra Dubinnotified of this and cancelled the 2nd part of the exam with contrast. He will speak to Dr AMargaretann Lovelessand decide on the next course of action. Patient feels fine. Having a snack and beverage.

## 2018-03-26 ENCOUNTER — Other Ambulatory Visit (HOSPITAL_COMMUNITY): Payer: Self-pay | Admitting: Cardiology

## 2018-03-26 DIAGNOSIS — R079 Chest pain, unspecified: Secondary | ICD-10-CM

## 2018-04-09 ENCOUNTER — Encounter: Payer: Self-pay | Admitting: Family Medicine

## 2018-04-09 ENCOUNTER — Ambulatory Visit (INDEPENDENT_AMBULATORY_CARE_PROVIDER_SITE_OTHER): Payer: Medicare HMO | Admitting: Family Medicine

## 2018-04-09 VITALS — BP 150/70 | HR 67 | Temp 97.8°F | Resp 16 | Wt 180.6 lb

## 2018-04-09 DIAGNOSIS — R252 Cramp and spasm: Secondary | ICD-10-CM

## 2018-04-09 DIAGNOSIS — Z794 Long term (current) use of insulin: Secondary | ICD-10-CM

## 2018-04-09 DIAGNOSIS — R739 Hyperglycemia, unspecified: Secondary | ICD-10-CM | POA: Diagnosis not present

## 2018-04-09 DIAGNOSIS — E1165 Type 2 diabetes mellitus with hyperglycemia: Secondary | ICD-10-CM | POA: Diagnosis not present

## 2018-04-09 DIAGNOSIS — R232 Flushing: Secondary | ICD-10-CM

## 2018-04-09 DIAGNOSIS — I10 Essential (primary) hypertension: Secondary | ICD-10-CM | POA: Diagnosis not present

## 2018-04-09 LAB — POCT GLYCOSYLATED HEMOGLOBIN (HGB A1C): Hemoglobin A1C: 10.1 % — AB (ref 4.0–5.6)

## 2018-04-09 LAB — GLUCOSE, POCT (MANUAL RESULT ENTRY): POC GLUCOSE: 222 mg/dL — AB (ref 70–99)

## 2018-04-09 MED ORDER — DAPAGLIFLOZIN PROPANEDIOL 5 MG PO TABS: 5.0000 mg | ORAL_TABLET | Freq: Every day | ORAL | 0 refills | Status: DC

## 2018-04-09 MED ORDER — LOSARTAN POTASSIUM 50 MG PO TABS: 50.0000 mg | ORAL_TABLET | Freq: Every day | ORAL | 1 refills | Status: DC

## 2018-04-09 NOTE — Progress Notes (Signed)
Subjective:    Patient ID: Jessica Hart, female    DOB: 31-Jul-1940, 78 y.o.   MRN: [id number]  HPI Chief Complaint  Patient presents with  . multiple issues    high bp- around 150. BS around 200 fasting. this am it was 255. cramp sunday in leg and stretched leg and cramp went into foot.    She is here with complaints of elevated blood sugars and BP for the past month or two since she started checking these again at home.   States she has missed some doses of her Tyler Aas but has been taking it daily again for the past 2 weeks. Denies any issues or concerns with medications.  States her diet is much worse and she has not been exercising.  Previous Hgb A1c 6.6% in September 2019.   Reports BP at home has been elevated as well as at her cardiology office.  BP running 150s/80s mainly. Denies missing any doses of HTN medications.   Having more hot flashes. States she feels like her temperature is not regulated any longer.   Complains of intermittent left leg cramping for the past few years. States she had a bad episode of left calf and left good 2 nights ago and it lasted 10-15 minutes. No cramping since.   She was evaluated by cardiology. Normal echo earlier this month. She is supposed to follow up but is not sure when.  Continues having DOE so she is limiting her activity until cleared by her cardiologist.   Denies fever, chills, dizziness, chest pain, palpitations, shortness of breath, abdominal pain, N/V/D, LE edema.  No polyuria or polydipsia or blurred vision.  Denies any changes to skin, hair or nails. No changes in bowel habits.   Reviewed allergies, medications, past medical, surgical, family, and social history.     Review of Systems Pertinent positives and negatives in the history of present illness.     Objective:   Physical Exam BP (!) 150/70   Pulse 67   Temp 97.8 F (36.6 C) (Oral)   Resp 16   Wt 180 lb 9.6 oz (81.9 kg)   SpO2 98%   BMI 31.99 kg/m    Alert and oriented and in no distress.  Pharyngeal area is normal, MMM. Neck is supple without adenopathy or thyromegaly. Cardiac exam shows a regular rhythm without murmurs or gallops. Lungs are clear to auscultation. Extremities without edema. Skin is warm and dry.       Assessment & Plan:  Type 2 diabetes mellitus with hyperglycemia, with long-term current use of insulin (HCC) - Plan: CBC with Differential/Platelet, Comprehensive metabolic panel, TSH, T4, free, dapagliflozin propanediol (FARXIGA) 5 MG TABS tablet  Elevated blood sugar - Plan: HgB A1c, Glucose (CBG)  Essential hypertension - Plan: CBC with Differential/Platelet, Comprehensive metabolic panel, TSH, T4, free, losartan (COZAAR) 50 MG tablet  Hot flashes - Plan: TSH, T4, free  Leg cramps - Plan: CBC with Differential/Platelet, Comprehensive metabolic panel, TSH, T4, free  PCOT random glucose 222 Hgb A1c 10.1% and diabetes is uncontrolled. She is asymptomatic.  Encouraged her to avoid skipping doses of her medication.  She will stop the [place] and start on Farxiga. One month of samples was given and a one month free trial was sent to her pharmacy. Continue on Metformin bid and Tresiba. Increase Tresiba to 12 units nightly for the next 2 nights and if her fasting BS is not less than 130 she will increase to 14 units in 2 days.  Strongly encouraged her to cut back on carbohydrates and sweets and make healthy food choices. She will return in 2 weeks with her blood sugar readings.  BP is not in goal range and has been elevated on several occasions. I will increase her Losartan from 25 to 50 mg. Continue checking BP at home and cut back on foods high in sodium (bacon specifically).  Check labs due to hot flashes and cramps.  She will follow up with cardiology as recommended.  Follow up pending labs or in 2 weeks.

## 2018-04-09 NOTE — Patient Instructions (Signed)
Your hemoglobin A1c is 10.1% today and blood sugars are uncontrolled.  Continue taking the metformin twice daily as you have been STOP the Tradjenta as discussed and start taking the new medication FARXIGA once daily.   Increase your Tresiba dose to 12 units nightly for the next 2 days and if your blood sugar is not less than 130, increase to 14 units on Thursday night and Friday night.   Goal blood sugar is between 80-130   Cut back on sweets and carbohydrates.   Your blood pressure is elevated. I am increasing your losartan to 50 mg. You can double up on the 25 mg tablets you have until they are finished.   Call and follow up with your cardiologist as they recommend.   Return to see me in 2 weeks and bring in your readings.

## 2018-04-10 ENCOUNTER — Other Ambulatory Visit: Payer: Self-pay | Admitting: Family Medicine

## 2018-04-10 DIAGNOSIS — K219 Gastro-esophageal reflux disease without esophagitis: Secondary | ICD-10-CM

## 2018-04-10 LAB — CBC WITH DIFFERENTIAL/PLATELET
Basophils Absolute: 0.1 10*3/uL (ref 0.0–0.2)
Basos: 1 %
EOS (ABSOLUTE): 0.1 10*3/uL (ref 0.0–0.4)
Eos: 1 %
Hematocrit: 40.9 % (ref 34.0–46.6)
Hemoglobin: 14.1 g/dL (ref 11.1–15.9)
Immature Grans (Abs): 0 10*3/uL (ref 0.0–0.1)
Immature Granulocytes: 0 %
Lymphocytes Absolute: 2.5 10*3/uL (ref 0.7–3.1)
Lymphs: 25 %
MCH: 28.8 pg (ref 26.6–33.0)
MCHC: 34.5 g/dL (ref 31.5–35.7)
MCV: 84 fL (ref 79–97)
Monocytes Absolute: 0.9 10*3/uL (ref 0.1–0.9)
Monocytes: 9 %
Neutrophils Absolute: 6.2 10*3/uL (ref 1.4–7.0)
Neutrophils: 64 %
Platelets: 320 10*3/uL (ref 150–450)
RBC: 4.9 x10E6/uL (ref 3.77–5.28)
RDW: 13.8 % (ref 11.7–15.4)
WBC: 9.8 10*3/uL (ref 3.4–10.8)

## 2018-04-10 LAB — COMPREHENSIVE METABOLIC PANEL
ALT: 12 IU/L (ref 0–32)
AST: 15 IU/L (ref 0–40)
Albumin/Globulin Ratio: 1.5 (ref 1.2–2.2)
Albumin: 4.4 g/dL (ref 3.7–4.7)
Alkaline Phosphatase: 123 IU/L — ABNORMAL HIGH (ref 39–117)
BUN/Creatinine Ratio: 12 (ref 12–28)
BUN: 9 mg/dL (ref 8–27)
Bilirubin Total: 0.4 mg/dL (ref 0.0–1.2)
CHLORIDE: 102 mmol/L (ref 96–106)
CO2: 21 mmol/L (ref 20–29)
Calcium: 9.4 mg/dL (ref 8.7–10.3)
Creatinine, Ser: 0.77 mg/dL (ref 0.57–1.00)
GFR calc non Af Amer: 75 mL/min/{1.73_m2} (ref >59)
GFR, EST AFRICAN AMERICAN: 87 mL/min/{1.73_m2} (ref >59)
Globulin, Total: 2.9 g/dL (ref 1.5–4.5)
Glucose: 157 mg/dL — ABNORMAL HIGH (ref 65–99)
Potassium: 4.1 mmol/L (ref 3.5–5.2)
Sodium: 142 mmol/L (ref 134–144)
TOTAL PROTEIN: 7.3 g/dL (ref 6.0–8.5)

## 2018-04-10 LAB — T4, FREE: Free T4: 1.1 ng/dL (ref 0.82–1.77)

## 2018-04-10 LAB — TSH: TSH: 1.74 u[IU]/mL (ref 0.450–4.500)

## 2018-04-23 ENCOUNTER — Encounter: Payer: Self-pay | Admitting: Family Medicine

## 2018-04-23 ENCOUNTER — Ambulatory Visit (INDEPENDENT_AMBULATORY_CARE_PROVIDER_SITE_OTHER): Payer: Medicare HMO | Admitting: Family Medicine

## 2018-04-23 VITALS — BP 130/80 | HR 72 | Wt 180.8 lb

## 2018-04-23 DIAGNOSIS — Z794 Long term (current) use of insulin: Secondary | ICD-10-CM | POA: Diagnosis not present

## 2018-04-23 DIAGNOSIS — I10 Essential (primary) hypertension: Secondary | ICD-10-CM | POA: Diagnosis not present

## 2018-04-23 DIAGNOSIS — I2510 Atherosclerotic heart disease of native coronary artery without angina pectoris: Secondary | ICD-10-CM | POA: Diagnosis not present

## 2018-04-23 DIAGNOSIS — E1165 Type 2 diabetes mellitus with hyperglycemia: Secondary | ICD-10-CM | POA: Diagnosis not present

## 2018-04-23 MED ORDER — DAPAGLIFLOZIN PROPANEDIOL 5 MG PO TABS: 5.0000 mg | ORAL_TABLET | Freq: Every day | ORAL | 4 refills | Status: DC

## 2018-04-23 NOTE — Patient Instructions (Addendum)
Dr. Margaretann Loveless at [facility] on [facility] Address: [address], [place], [place] [address] Phone: [phone]

## 2018-04-23 NOTE — Progress Notes (Signed)
   Subjective:    Patient ID: Jessica Hart, female    DOB: 1940-12-17, 78 y.o.   MRN: [id number]  HPI Chief Complaint  Patient presents with  . follow-up    follow-up on DM and HTN. blood sugars- 140's average.    She is here to follow up on hyperglycemia and a Hgb A1c of 10.1% 2 weeks ago.   We stopped Tradjenta and started her on Farxiga. Increased Tresiba from 10 units daily to 12 units.   BS at home is now between 130-140s and much improved.  She reports eating more grilled food and cut back on sweets. Feeling better.    BP was also elevated. Increased losartan.   Denies fever, chills, dizziness, chest pain, palpitations, shortness of breath, abdominal pain, N/V/D, urinary symptoms, LE edema.     Review of Systems Pertinent positives and negatives in the history of present illness.     Objective:   Physical Exam BP 130/80   Pulse 72   Wt 180 lb 12.8 oz (82 kg)   BMI 32.03 kg/m   Alert and oriented and in no acute distress. Not otherwise examined.       Assessment & Plan:  Type 2 diabetes mellitus with hyperglycemia, unspecified whether long term insulin use (HCC)  Essential hypertension  Coronary artery calcification seen on CAT scan  Her blood sugars and BPs are responding well to medication changes. She reports feeling better and concerns with new changes.  Continue on current medication regimen and eating a healthy diet.  Recommend she follow up with her cardiologist.

## 2018-04-25 ENCOUNTER — Encounter: Payer: Self-pay | Admitting: Family Medicine

## 2018-04-25 DIAGNOSIS — H524 Presbyopia: Secondary | ICD-10-CM | POA: Diagnosis not present

## 2018-04-25 DIAGNOSIS — Z794 Long term (current) use of insulin: Secondary | ICD-10-CM | POA: Diagnosis not present

## 2018-04-25 DIAGNOSIS — Z961 Presence of intraocular lens: Secondary | ICD-10-CM | POA: Diagnosis not present

## 2018-04-25 DIAGNOSIS — E119 Type 2 diabetes mellitus without complications: Secondary | ICD-10-CM | POA: Diagnosis not present

## 2018-04-25 DIAGNOSIS — H52201 Unspecified astigmatism, right eye: Secondary | ICD-10-CM | POA: Diagnosis not present

## 2018-04-25 LAB — HM DIABETES EYE EXAM

## 2018-04-25 NOTE — Progress Notes (Unsigned)
a

## 2018-05-07 ENCOUNTER — Other Ambulatory Visit: Payer: Self-pay | Admitting: Family Medicine

## 2018-05-07 DIAGNOSIS — R69 Illness, unspecified: Secondary | ICD-10-CM | POA: Diagnosis not present

## 2018-05-14 ENCOUNTER — Telehealth: Payer: Self-pay

## 2018-05-14 NOTE — Telephone Encounter (Signed)
Contacted patient to reschedule her appointment if she didn't have any issues or concerns that needed addressing as soon as possible and if agreed appointment will be in 3 months.  Spoke with patient she states that she is not having any issues and agreed to rescheduling appointment in 3 months.

## 2018-05-15 ENCOUNTER — Ambulatory Visit: Payer: Medicare HMO | Admitting: Family Medicine

## 2018-05-20 ENCOUNTER — Ambulatory Visit: Payer: Medicare HMO | Admitting: Internal Medicine

## 2018-05-20 ENCOUNTER — Other Ambulatory Visit: Payer: Self-pay | Admitting: Family Medicine

## 2018-05-21 ENCOUNTER — Telehealth: Payer: Self-pay

## 2018-05-21 DIAGNOSIS — E1165 Type 2 diabetes mellitus with hyperglycemia: Secondary | ICD-10-CM

## 2018-05-21 DIAGNOSIS — Z794 Long term (current) use of insulin: Principal | ICD-10-CM

## 2018-05-21 MED ORDER — DAPAGLIFLOZIN PROPANEDIOL 5 MG PO TABS: 5.0000 mg | ORAL_TABLET | Freq: Every day | ORAL | 4 refills | Status: DC

## 2018-05-21 NOTE — Telephone Encounter (Signed)
Patient has called requesting more samples for [place]. Patient was notified that Nemiah Commander was ready for pickup however she will get a call when we get Tyler Aas is in.

## 2018-05-24 ENCOUNTER — Telehealth: Payer: Self-pay | Admitting: Family Medicine

## 2018-05-24 MED ORDER — INSULIN DEGLUDEC 100 UNIT/ML ~~LOC~~ SOPN: 10.0000 [IU] | PEN_INJECTOR | Freq: Every day | SUBCUTANEOUS | 0 refills | Status: DC

## 2018-05-24 NOTE — Telephone Encounter (Signed)
Pt called and requested samples of Tresiba. She states she is almost out. Pt can be reached at [phone].

## 2018-05-24 NOTE — Telephone Encounter (Signed)
Pt coming to pick up one sample that is left

## 2018-06-26 ENCOUNTER — Telehealth: Payer: Self-pay | Admitting: Family Medicine

## 2018-06-26 DIAGNOSIS — Z794 Long term (current) use of insulin: Principal | ICD-10-CM

## 2018-06-26 DIAGNOSIS — E1165 Type 2 diabetes mellitus with hyperglycemia: Secondary | ICD-10-CM

## 2018-06-26 MED ORDER — DAPAGLIFLOZIN PROPANEDIOL 5 MG PO TABS: 5.0000 mg | ORAL_TABLET | Freq: Every day | ORAL | 0 refills | Status: DC

## 2018-06-26 NOTE — Telephone Encounter (Signed)
Pt called for samples of [place]. Please call pt at [phone] when ready.

## 2018-06-26 NOTE — Telephone Encounter (Signed)
Pt was told to come by Monday to get samples of both cause tresiba has not come in yet

## 2018-07-01 ENCOUNTER — Telehealth: Payer: Self-pay | Admitting: Family Medicine

## 2018-07-01 DIAGNOSIS — K219 Gastro-esophageal reflux disease without esophagitis: Secondary | ICD-10-CM

## 2018-07-01 DIAGNOSIS — I10 Essential (primary) hypertension: Secondary | ICD-10-CM

## 2018-07-01 MED ORDER — LOVASTATIN 20 MG PO TABS: 20.0000 mg | ORAL_TABLET | Freq: Every day | ORAL | 1 refills | Status: DC

## 2018-07-01 MED ORDER — OMEPRAZOLE 40 MG PO CPDR: 40.0000 mg | DELAYED_RELEASE_CAPSULE | Freq: Every day | ORAL | 1 refills | Status: DC

## 2018-07-01 NOTE — Telephone Encounter (Signed)
Pt was notified and will call her insurance company

## 2018-07-01 NOTE — Telephone Encounter (Signed)
Pt called and requested another medication to replace Jessica Hart because we do not have samples. Also Pt needs rx for omeprazole changed to 90 days instead of 30. Also, pt needs refills of lovastatin for 90 days sent to [address].

## 2018-07-01 NOTE — Telephone Encounter (Signed)
Please advise of tresiba. No samples in stock

## 2018-07-01 NOTE — Telephone Encounter (Signed)
Please take care of this.  I am okay with the omeprazole and lovastatin for 90 days. Jessica Hart can check with her insurance if Jessica Hart would like to see which insulin is better covered to replace [place]. The options are Lantus, Basaglar, Levemir, Toujeo.  This is a long-acting insulin.  If Jessica Hart prefers, we can send in Lantus or Basaglar to her pharmacy to replace the [place] but we will not know if this is better covered.

## 2018-07-02 ENCOUNTER — Telehealth: Payer: Self-pay | Admitting: Family Medicine

## 2018-07-02 NOTE — Telephone Encounter (Addendum)
I have ordered samples of Tresiba & checked with the rep and they have been shipped but we do not know when they will be in the office.  Pt is out of her [place].  Pt called insurance company and all the insulins you listed are Tier 3 and not affordable, and their are no generics.  Also tried Tiering exception for lower cost and was denied. We have samples of Basaglar in the refrig.  Can she be switched to [facility] while waiting for the Tresiba?

## 2018-07-03 NOTE — Telephone Encounter (Signed)
Is the dosing the same for the [place] as the [place]?

## 2018-07-03 NOTE — Telephone Encounter (Signed)
Called pt and informed

## 2018-07-03 NOTE — Telephone Encounter (Signed)
Ok to switch her to [employer] for now.

## 2018-07-03 NOTE — Telephone Encounter (Signed)
It is the same. Thanks.

## 2018-07-04 NOTE — Telephone Encounter (Signed)
Tresiba samples finally came in, called pt and informed Gabriel Cirri has her samples in refrig

## 2018-07-10 ENCOUNTER — Encounter: Payer: Self-pay | Admitting: Family Medicine

## 2018-07-10 ENCOUNTER — Other Ambulatory Visit: Payer: Self-pay

## 2018-07-10 ENCOUNTER — Ambulatory Visit (INDEPENDENT_AMBULATORY_CARE_PROVIDER_SITE_OTHER): Payer: Medicare HMO | Admitting: Family Medicine

## 2018-07-10 VITALS — BP 130/78 | HR 72 | Temp 98.0°F | Wt 179.8 lb

## 2018-07-10 DIAGNOSIS — E6609 Other obesity due to excess calories: Secondary | ICD-10-CM | POA: Diagnosis not present

## 2018-07-10 DIAGNOSIS — Z6831 Body mass index (BMI) 31.0-31.9, adult: Secondary | ICD-10-CM | POA: Diagnosis not present

## 2018-07-10 DIAGNOSIS — E119 Type 2 diabetes mellitus without complications: Secondary | ICD-10-CM | POA: Diagnosis not present

## 2018-07-10 DIAGNOSIS — E781 Pure hyperglyceridemia: Secondary | ICD-10-CM | POA: Diagnosis not present

## 2018-07-10 DIAGNOSIS — R748 Abnormal levels of other serum enzymes: Secondary | ICD-10-CM

## 2018-07-10 DIAGNOSIS — I10 Essential (primary) hypertension: Secondary | ICD-10-CM

## 2018-07-10 DIAGNOSIS — Z79899 Other long term (current) drug therapy: Secondary | ICD-10-CM

## 2018-07-10 LAB — COMPREHENSIVE METABOLIC PANEL
ALT: 11 IU/L (ref 0–32)
AST: 14 IU/L (ref 0–40)
Albumin/Globulin Ratio: 1.8 (ref 1.2–2.2)
Albumin: 4.6 g/dL (ref 3.7–4.7)
Alkaline Phosphatase: 103 IU/L (ref 39–117)
BUN/Creatinine Ratio: 15 (ref 12–28)
BUN: 10 mg/dL (ref 8–27)
Bilirubin Total: 0.4 mg/dL (ref 0.0–1.2)
CO2: 20 mmol/L (ref 20–29)
Calcium: 9.7 mg/dL (ref 8.7–10.3)
Chloride: 106 mmol/L (ref 96–106)
Creatinine, Ser: 0.67 mg/dL (ref 0.57–1.00)
GFR calc Af Amer: 99 mL/min/{1.73_m2} (ref >59)
GFR calc non Af Amer: 86 mL/min/{1.73_m2} (ref >59)
Globulin, Total: 2.6 g/dL (ref 1.5–4.5)
Glucose: 115 mg/dL — ABNORMAL HIGH (ref 65–99)
Potassium: 3.5 mmol/L (ref 3.5–5.2)
Sodium: 145 mmol/L — ABNORMAL HIGH (ref 134–144)
Total Protein: 7.2 g/dL (ref 6.0–8.5)

## 2018-07-10 LAB — LIPID PANEL
Chol/HDL Ratio: 4.5 ratio — ABNORMAL HIGH (ref 0.0–4.4)
Cholesterol, Total: 144 mg/dL (ref 100–199)
HDL: 32 mg/dL — ABNORMAL LOW (ref >39)
LDL Calculated: 72 mg/dL (ref 0–99)
Triglycerides: 199 mg/dL — ABNORMAL HIGH (ref 0–149)
VLDL Cholesterol Cal: 40 mg/dL (ref 5–40)

## 2018-07-10 LAB — POCT GLYCOSYLATED HEMOGLOBIN (HGB A1C): Hemoglobin A1C: 7.9 % — AB (ref 4.0–5.6)

## 2018-07-10 NOTE — Progress Notes (Signed)
Subjective:    Patient ID: Jessica Hart, female    DOB: February 02, 1941, 78 y.o.   MRN: [id number]  Jessica Hart is a 78 y.o. female who presents for follow-up of Type 2 diabetes mellitus and other chronic health conditions.   Patient is checking home blood sugars.   Home blood sugar records: BGs range between 118 and 150 How often is blood sugars being checked: once a day Current symptoms include: none. Patient denies increased appetite, nausea, visual disturbances, vomiting and weight loss.  Patient is checking their feet daily. Any Foot concerns (callous, ulcer, wound, thickened nails, toenail fungus, skin fungus, hammer toe): none Last dilated eye exam: February 2020 with Dr. Gershon Crane and no retinopathy.   Current treatments: doing well on DM meds. Metformin twice daily. Wilder Glade once daily and [place] 12 units per day.  Medication compliance: good  Current diet: in general, a "healthy" diet   Current exercise: none Known diabetic complications: none   Has follow up with cardiologist in June.   HTN- reports good daily compliance with medications. Low salt.   Taking a statin daily with no side effects.   The following portions of the patient's history were reviewed and updated as appropriate: allergies, current medications, past medical history, past social history and problem list.  ROS as in subjective above.     Objective:    Physical Exam Alert and oriented and in no distress.  Cardiac exam shows a regular rhythm without murmurs or gallops. Lungs are clear to auscultation. Extremities without edema, pulses intact. Skin is warm and dry.    Blood pressure 130/78, pulse 72, temperature 98 F (36.7 C), weight 179 lb 12.8 oz (81.6 kg).  Lab Review Diabetic Labs Latest Ref Rng & Units 07/10/2018 04/09/2018 02/22/2018 11/15/2017 05/10/2017  HbA1c 4.0 - 5.6 % 7.9(A) 10.1(A) - 6.6(A) 6.8%  Microalbumin Not estab mg/dL - - - - -  Micro/Creat Ratio <30 mcg/mg creat - - - -  -  Chol 100 - 199 mg/dL - - - 158 124  HDL >39 mg/dL - - - 37(L) 32(L)  Calc LDL 0 - 99 mg/dL - - - 64 61  Triglycerides 0 - 149 mg/dL - - - 287(H) 155(H)  Creatinine 0.57 - 1.00 mg/dL - 0.77 0.73 0.80 0.74   BP/Weight 07/10/2018 04/23/2018 04/09/2018 03/21/2018 2/44/0102  Systolic BP 725 366 440 347 425  Diastolic BP 78 80 70 62 67  Wt. (Lbs) 179.8 180.8 180.6 - -  BMI 31.85 32.03 31.99 - -   Foot/eye exam completion dates Latest Ref Rng & Units 04/25/2018 11/15/2017  Eye Exam No Retinopathy No Retinopathy -  Foot Form Completion - - Done    Meshia  reports that she quit smoking about 12 years ago. She has a 45.00 pack-year smoking history. She quit smokeless tobacco use about 13 years ago. She reports current alcohol use. She reports that she does not use drugs.     Assessment & Plan:    Controlled type 2 diabetes mellitus without complication, unspecified whether long term insulin use ([place]) - Plan: HgB A1c  Essential hypertension  HYPERTRIGLYCERIDEMIA - Plan: Lipid panel  Class 1 obesity due to excess calories with serious comorbidity and body mass index (BMI) of 31.0 to 31.9 in adult  Elevated alkaline phosphatase level - Plan: Comprehensive metabolic panel  Medication management - Plan: Lipid panel  1. Rx changes: none Hgb A1c improved to 7.9% 2. Education: Reviewed 'ABCs' of diabetes management (respective goals in parentheses):  A1C (<7), blood pressure (<130/80), and cholesterol (LDL <100). 3. Compliance at present is estimated to be good. Efforts to improve compliance (if necessary) will be directed at dietary modifications: cut back on sweets, carbohydrates, increased exercise and regular blood sugar monitoring: daily. 4. HTN- BP controlled. Continue current regimen and follow up with cardiology as recommended.  5. Lipids checked today. Continue statin.  6. Follow up: 4 months

## 2018-08-07 ENCOUNTER — Telehealth: Payer: Self-pay | Admitting: Family Medicine

## 2018-08-07 NOTE — Telephone Encounter (Signed)
Pt called asking for samples of Farxiga.  Please advise pt [phone]

## 2018-08-07 NOTE — Telephone Encounter (Signed)
Ok to give her samples.

## 2018-08-13 NOTE — Telephone Encounter (Signed)
Samples were given

## 2018-08-15 ENCOUNTER — Other Ambulatory Visit: Payer: Self-pay | Admitting: Family Medicine

## 2018-08-15 ENCOUNTER — Telehealth: Payer: Self-pay

## 2018-08-15 NOTE — Telephone Encounter (Signed)
Virtual Visit Pre-Appointment Phone Call  "(Jessica Hart), I am calling you today to discuss your upcoming appointment. We are currently trying to limit exposure to the virus that causes COVID-19 by seeing patients at home rather than in the office."  1. "What is the BEST phone number to call the day of the visit?" - [phone]  2. "Do you have or have access to (through a family member/friend) a smartphone with video capability that we can use for your visit a. If no - list the appointment type as a PHONE visit in appointment notes  3. Confirm consent - "In the setting of the current Covid19 crisis, you are scheduled for a (phone or video) visit with your provider on (June 24) at (10:00am).  Just as we do with many in-office visits, in order for you to participate in this visit, we must obtain consent.  If you'd like, I can send this to your mychart (if signed up) or email for you to review.  Otherwise, I can obtain your verbal consent now.  All virtual visits are billed to your insurance company just like a normal visit would be.  By agreeing to a virtual visit, we'd like you to understand that the technology does not allow for your provider to perform an examination, and thus may limit your provider's ability to fully assess your condition. If your provider identifies any concerns that need to be evaluated in person, we will make arrangements to do so.  Finally, though the technology is pretty good, we cannot assure that it will always work on either your or our end, and in the setting of a video visit, we may have to convert it to a phone-only visit.  In either situation, we cannot ensure that we have a secure connection.  Are you willing to proceed?" STAFF: Did the patient verbally acknowledge consent to telehealth visit? Document YES/NO here: YES  4. Advise patient to be prepared - "Two hours prior to your appointment, go ahead and check your blood pressure, pulse, oxygen saturation,  and your weight (if you have the equipment to check those) and write them all down. When your visit starts, your provider will ask you for this information. If you have an Apple Watch or Kardia device, please plan to have heart rate information ready on the day of your appointment. Please have a pen and paper handy nearby the day of the visit as well."  5. Give patient instructions for MyChart download to smartphone OR Doximity/Doxy.me as below if video visit (depending on what platform provider is using)  6. Inform patient they will receive a phone call 15 minutes prior to their appointment time (may be from unknown caller ID) so they should be prepared to answer    [place] has been deemed a candidate for a follow-up tele-health visit to limit community exposure during the Covid-19 pandemic. I spoke with the patient via phone to ensure availability of phone/video source, confirm preferred email & phone number, and discuss instructions and expectations.  I reminded Jessica Hart to be prepared with any vital sign and/or heart rhythm information that could potentially be obtained via home monitoring, at the time of her visit. I reminded Jessica Hart to expect a phone call prior to her visit.  Jessica Hart, [place] 08/15/2018 10:50 AM   INSTRUCTIONS FOR DOWNLOADING THE MYCHART APP TO SMARTPHONE  - The patient must first make sure to have activated MyChart  and know their login information - If Apple, go to [employer] and type in MyChart in the search bar and download the app. If Android, ask patient to go to [employer] and type in [place] in the search bar and download the app. The app is free but as with any other app downloads, their phone may require them to verify saved payment information or Apple/Android password.  - The patient will need to then log into the app with their MyChart username and password, and select [facility] as their  healthcare provider to link the account. When it is time for your visit, go to the MyChart app, find appointments, and click Begin Video Visit. Be sure to Select Allow for your device to access the Microphone and Camera for your visit. You will then be connected, and your provider will be with you shortly.  **If they have any issues connecting, or need assistance please contact MyChart service desk [phone])**  **If using a computer, in order to ensure the best quality for their visit they will need to use either of the following Internet Browsers: [employer], or Google Chrome**  IF USING DOXIMITY or DOXY.ME - The patient will receive a link just prior to their visit by text.     FULL LENGTH CONSENT FOR TELE-HEALTH VISIT   I hereby voluntarily request, consent and authorize [facility] and its employed or contracted physicians, physician assistants, nurse practitioners or other licensed health care professionals (the Practitioner), to provide me with telemedicine health care services (the "Services") as deemed necessary by the treating Practitioner. I acknowledge and consent to receive the Services by the Practitioner via telemedicine. I understand that the telemedicine visit will involve communicating with the Practitioner through live audiovisual communication technology and the disclosure of certain medical information by electronic transmission. I acknowledge that I have been given the opportunity to request an in-person assessment or other available alternative prior to the telemedicine visit and am voluntarily participating in the telemedicine visit.  I understand that I have the right to withhold or withdraw my consent to the use of telemedicine in the course of my care at any time, without affecting my right to future care or treatment, and that the Practitioner or I may terminate the telemedicine visit at any time. I understand that I have the right to inspect all  information obtained and/or recorded in the course of the telemedicine visit and may receive copies of available information for a reasonable fee.  I understand that some of the potential risks of receiving the Services via telemedicine include:  Marland Kitchen Delay or interruption in medical evaluation due to technological equipment failure or disruption; . Information transmitted may not be sufficient (e.g. poor resolution of images) to allow for appropriate medical decision making by the Practitioner; and/or  . In rare instances, security protocols could fail, causing a breach of personal health information.  Furthermore, I acknowledge that it is my responsibility to provide information about my medical history, conditions and care that is complete and accurate to the best of my ability. I acknowledge that Practitioner's advice, recommendations, and/or decision may be based on factors not within their control, such as incomplete or inaccurate data provided by me or distortions of diagnostic images or specimens that may result from electronic transmissions. I understand that the practice of medicine is not an exact science and that Practitioner makes no warranties or guarantees regarding treatment outcomes. I acknowledge that I will receive a copy of  this consent concurrently upon execution via email to the email address I last provided but may also request a printed copy by calling the office of [facility].    I understand that my insurance will be billed for this visit.   I have read or had this consent read to me. . I understand the contents of this consent, which adequately explains the benefits and risks of the Services being provided via telemedicine.  . I have been provided ample opportunity to ask questions regarding this consent and the Services and have had my questions answered to my satisfaction. . I give my informed consent for the services to be provided through the use of telemedicine in my  medical care  By participating in this telemedicine visit I agree to the above.

## 2018-08-17 ENCOUNTER — Other Ambulatory Visit: Payer: Self-pay | Admitting: Family Medicine

## 2018-08-19 ENCOUNTER — Ambulatory Visit: Payer: Medicare HMO | Admitting: Family Medicine

## 2018-08-19 DIAGNOSIS — R69 Illness, unspecified: Secondary | ICD-10-CM | POA: Diagnosis not present

## 2018-08-20 NOTE — Progress Notes (Signed)
Virtual Visit via Telephone Note   This visit type was conducted due to national recommendations for restrictions regarding the COVID-19 Pandemic (e.g. social distancing) in an effort to limit this patient's exposure and mitigate transmission in our community.  Due to her co-morbid illnesses, this patient is at least at moderate risk for complications without adequate follow up.  This format is felt to be most appropriate for this patient at this time.  The patient did not have access to video technology/had technical difficulties with video requiring transitioning to audio format only (telephone).  All issues noted in this document were discussed and addressed.  No physical exam could be performed with this format.  Please refer to the patient's chart for her  consent to telehealth for Jessica Hart.   Date:  08/21/2018   ID:  Jessica Hart, DOB 21-Aug-1940, MRN [id number]  Patient Location: Home Provider Location: Office  PCP:  Girtha Rm, NP-C  Cardiologist:  No primary care provider on file.  Electrophysiologist:  None   Evaluation Performed:  Follow-Up Visit  Chief Complaint: Coronary artery calcifications. History of Present Illness:    Jessica Hart is a 78 y.o. female with a hx of diabetes mellitus, hypertension and hyperlipidemia.  Our initial encounter was for coronary artery calcium seen on a lung cancer screening CT performed for a 45-pack-year smoking history.  We attempted to obtain a CT coronary angiogram to evaluate the patient's coronary artery calcifications, however study would not be optimally obtained due to heart rate and it was canceled.  She has mild shortness of breath with shopping or walking.  She notices this within the first 30 minutes but it will improve afterward.  She also notices shortness of breath with taking a shower.  She tells me that when she was previously more active last year she felt better with walking not worse.  She denies  exertional chest pain and denies palpitations.  The patient denies chest pain, chest pressure, dyspnea at rest, palpitations, PND, orthopnea, or leg swelling. Denies syncope or presyncope. Denies dizziness or lightheadedness.   The patient does not have symptoms concerning for COVID-19 infection (fever, chills, cough, or new shortness of breath).    Past Medical History:  Diagnosis Date  . Arthritis   . Cataract bilaterl 3 years ago  . Diabetes mellitus 02/27/2005  . Diabetes mellitus type 2, controlled, without complications ([place]) 9/62/8366   Qualifier: Diagnosis of  By: Drucie Ip    . Diverticulosis   . Echocardiogram findings abnormal, without diagnosis 2005   EF 47%, no ischemia, no infarction  . Former smoker 01/10/2017   45 pack year history, quit in 2007   . GERD (gastroesophageal reflux disease)   . History of bone density study 2006   Lt hip = -1.0, L spine = -1.8   . Hyperlipidemia   . Hypertension    Past Surgical History:  Procedure Laterality Date  . ABDOMINAL HYSTERECTOMY  1989   one ovary remains per patient  . TONSILECTOMY, ADENOIDECTOMY, BILATERAL MYRINGOTOMY AND TUBES  1965     Current Meds  Medication Sig  . acetaminophen (TYLENOL) 500 MG tablet Take 2 tablets (1,000 mg total) by mouth every 8 (eight) hours as needed. (Patient taking differently: Take 1,000 mg by mouth 2 (two) times daily after a meal. )  . amLODipine (NORVASC) 10 MG tablet Take 1 tablet by mouth once daily  . dapagliflozin propanediol (FARXIGA) 5 MG TABS tablet Take 5 mg by mouth daily.  Marland Kitchen  insulin degludec (TRESIBA FLEXTOUCH) 100 UNIT/ML SOPN FlexTouch Pen Inject 0.1 mLs (10 Units total) into the skin daily after supper.  . Insulin Pen Needle (PEN NEEDLES 5/16") 30G X 8 MM MISC Pen needles used for lantus injections  . Lancets (ONETOUCH DELICA PLUS [id number]) MISC USE TO CHECK BLOOD SUGAR ONCE DAILY  . lipase/protease/amylase (CREON) 36000 UNITS CPEP capsule Take 2 capsules with each  meal three times daily  Take 1 capsule with each snack twice daily  . losartan (COZAAR) 50 MG tablet Take 1 tablet (50 mg total) by mouth daily.  Marland Kitchen lovastatin (MEVACOR) 20 MG tablet Take 1 tablet (20 mg total) by mouth daily.  . metFORMIN (GLUCOPHAGE) 1000 MG tablet TAKE 1 TABLET BY MOUTH TWICE DAILY WITH A MEAL  . metoprolol tartrate (LOPRESSOR) 100 MG tablet Take 1 tablet by mouth twice daily  . omeprazole (PRILOSEC) 40 MG capsule Take 1 capsule (40 mg total) by mouth daily.  Glory Rosebush VERIO test strip USE 1 STRIP TO CHECK GLUCOSE ONCE DAILY  . tobramycin (TOBREX) 0.3 % ophthalmic solution Place 1 drop into the left eye every morning. Prn for dry eyes     Allergies:   Lisinopril   Social History   Tobacco Use  . Smoking status: Former Smoker    Packs/day: 1.00    Years: 45.00    Pack years: 45.00    Quit date: 07/27/2005    Years since quitting: 13.0  . Smokeless tobacco: Former Systems developer    Quit date: 03/2005  Substance Use Topics  . Alcohol use: Yes    Comment: beer occasionally   . Drug use: No     Family Hx: The patient's family history includes Cancer in her sister; Heart disease in her father; Tongue cancer in her sister. There is no history of Colon polyps, Rectal cancer, or Stomach cancer.  ROS:   Please see the history of present illness.     All other systems reviewed and are negative.   Prior CV studies:   The following studies were reviewed today:    Labs/Other Tests and Data Reviewed:    EKG:  No ECG reviewed.  Recent Labs: 04/09/2018: Hemoglobin 14.1; Platelets 320; TSH 1.740 07/10/2018: ALT 11; BUN 10; Creatinine, Ser 0.67; Potassium 3.5; Sodium 145   Recent Lipid Panel Lab Results  Component Value Date/Time   CHOL 144 07/10/2018 09:56 AM   TRIG 199 (H) 07/10/2018 09:56 AM   HDL 32 (L) 07/10/2018 09:56 AM   CHOLHDL 4.5 (H) 07/10/2018 09:56 AM   CHOLHDL 4.4 08/29/2016 09:20 AM   LDLCALC 72 07/10/2018 09:56 AM   LDLDIRECT 81 03/21/2013 02:31 PM     Wt Readings from Last 3 Encounters:  08/21/18 180 lb (81.6 kg)  07/10/18 179 lb 12.8 oz (81.6 kg)  04/23/18 180 lb 12.8 oz (82 kg)     Objective:    Vital Signs:  BP (!) 142/78   Pulse 68   Ht _0  (1.6 m)   Wt 180 lb (81.6 kg)   BMI 31.89 kg/m    VITAL SIGNS:  reviewed GEN:  no acute distress  ASSESSMENT & PLAN:    1. Coronary artery calcification   2. Hypertension associated with diabetes ([place])   3. Hyperlipidemia associated with type 2 diabetes mellitus ([place])   4. Former smoker    Coronary artery calcifications- I have offered the patient alternate stress testing to evaluate coronary artery calcifications.  At this time we have participated in shared decision making  and she feels her symptoms are mild and have not progressed and would like to wait till her next appointment to discuss further.  I think this is reasonable so long as her symptoms do not progress further.  She has atypical symptoms for ischemia.  I will have a low threshold to perform stress Myoview or exercise testing when available.  She is also on Farxiga  Hypertension- blood pressure is mildly elevated, patient continues on amlodipine 10 mg daily losartan 50 mg daily.  Patient states that she usually has slightly better blood pressure control.  We can observe however if elevated again at PCP or at our office, we should increase her dose of losartan.  Hyperlipidemia-patient is on lovastatin 20 mg daily, LDL is reasonably well controlled.  Can consider alternate therapies or up titration as tolerated.  COVID-19 Education: The signs and symptoms of COVID-19 were discussed with the patient and how to seek care for testing (follow up with PCP or arrange E-visit).  The importance of social distancing was discussed today.  Time:   Today, I have spent 15 minutes with the patient with telehealth technology discussing the above problems.     Medication Adjustments/Labs and Tests Ordered: Current medicines are  reviewed at length with the patient today.  Concerns regarding medicines are outlined above.   Tests Ordered: No orders of the defined types were placed in this encounter.   Medication Changes: No orders of the defined types were placed in this encounter.   Follow Up:  Virtual Visit or In Person in 3 month(s)  Signed, Elouise Munroe, MD  08/21/2018 9:15 PM     [facility]

## 2018-08-21 ENCOUNTER — Telehealth (INDEPENDENT_AMBULATORY_CARE_PROVIDER_SITE_OTHER): Payer: Medicare HMO | Admitting: Internal Medicine

## 2018-08-21 ENCOUNTER — Encounter: Payer: Self-pay | Admitting: Internal Medicine

## 2018-08-21 VITALS — BP 142/78 | HR 68 | Ht 63.0 in | Wt 180.0 lb

## 2018-08-21 DIAGNOSIS — E1169 Type 2 diabetes mellitus with other specified complication: Secondary | ICD-10-CM | POA: Diagnosis not present

## 2018-08-21 DIAGNOSIS — E785 Hyperlipidemia, unspecified: Secondary | ICD-10-CM

## 2018-08-21 DIAGNOSIS — Z87891 Personal history of nicotine dependence: Secondary | ICD-10-CM | POA: Diagnosis not present

## 2018-08-21 DIAGNOSIS — I10 Essential (primary) hypertension: Secondary | ICD-10-CM | POA: Diagnosis not present

## 2018-08-21 DIAGNOSIS — I2584 Coronary atherosclerosis due to calcified coronary lesion: Secondary | ICD-10-CM

## 2018-08-21 DIAGNOSIS — I2510 Atherosclerotic heart disease of native coronary artery without angina pectoris: Secondary | ICD-10-CM | POA: Diagnosis not present

## 2018-08-21 DIAGNOSIS — I152 Hypertension secondary to endocrine disorders: Secondary | ICD-10-CM

## 2018-08-21 DIAGNOSIS — E1159 Type 2 diabetes mellitus with other circulatory complications: Secondary | ICD-10-CM | POA: Diagnosis not present

## 2018-08-21 NOTE — Patient Instructions (Addendum)
Medication Instructions:  Your physician recommends that you continue on your current medications as directed. Please refer to the Current Medication list given to you today.  If you need a refill on your cardiac medications before your next appointment, please call your pharmacy.   Lab work: NONE  Testing/Procedures: NONE  Follow-Up: At [facility], you and your health needs are our priority.  As part of our continuing mission to provide you with exceptional heart care, we have created designated Provider Care Teams.  These Care Teams include your primary Cardiologist (physician) and Advanced Practice Providers (APPs -  Physician Assistants and Nurse Practitioners) who all work together to provide you with the care you need, when you need it. You will need a follow up appointment in 3 months.  Please call our office 2 months in advance to schedule this appointment.  You may see  or one of the following Advanced Practice Providers on your designated Care Team:   Kerin Ransom, PA-C Roby Lofts, [place] . Sande Rives, PA-C

## 2018-09-02 ENCOUNTER — Other Ambulatory Visit: Payer: Self-pay | Admitting: Family Medicine

## 2018-09-02 ENCOUNTER — Telehealth: Payer: Self-pay | Admitting: Family Medicine

## 2018-09-02 NOTE — Telephone Encounter (Signed)
Fax refill request  Metoprolol tart  100 mg #180

## 2018-09-02 NOTE — Telephone Encounter (Signed)
Medication was sent to pharmacy

## 2018-09-05 ENCOUNTER — Telehealth: Payer: Self-pay

## 2018-09-05 DIAGNOSIS — E1165 Type 2 diabetes mellitus with hyperglycemia: Secondary | ICD-10-CM

## 2018-09-05 DIAGNOSIS — Z794 Long term (current) use of insulin: Secondary | ICD-10-CM

## 2018-09-05 MED ORDER — FARXIGA 5 MG PO TABS: 5.0000 mg | ORAL_TABLET | Freq: Every day | ORAL | 0 refills | Status: DC

## 2018-09-05 NOTE — Telephone Encounter (Signed)
Pt is here for Farxiga samples.

## 2018-09-23 ENCOUNTER — Telehealth: Payer: Self-pay | Admitting: Family Medicine

## 2018-09-23 NOTE — Telephone Encounter (Signed)
Pt called and is requesting a refill on her amlodipine pt needs [facility] on [address]

## 2018-09-24 MED ORDER — AMLODIPINE BESYLATE 10 MG PO TABS: 10.0000 mg | ORAL_TABLET | Freq: Every day | ORAL | 0 refills | Status: DC

## 2018-09-24 NOTE — Telephone Encounter (Signed)
Patient has been informed that [facility] request.

## 2018-09-24 NOTE — Addendum Note (Signed)
Addended by: Edgar Frisk on: 09/24/2018 09:28 AM   Modules accepted: Orders

## 2018-09-27 DIAGNOSIS — Z7189 Other specified counseling: Secondary | ICD-10-CM | POA: Diagnosis not present

## 2018-09-27 DIAGNOSIS — Z03818 Encounter for observation for suspected exposure to other biological agents ruled out: Secondary | ICD-10-CM | POA: Diagnosis not present

## 2018-09-27 DIAGNOSIS — I10 Essential (primary) hypertension: Secondary | ICD-10-CM | POA: Diagnosis not present

## 2018-09-27 DIAGNOSIS — E119 Type 2 diabetes mellitus without complications: Secondary | ICD-10-CM | POA: Diagnosis not present

## 2018-09-30 ENCOUNTER — Other Ambulatory Visit: Payer: Self-pay | Admitting: Family Medicine

## 2018-09-30 DIAGNOSIS — I10 Essential (primary) hypertension: Secondary | ICD-10-CM

## 2018-09-30 MED ORDER — LOSARTAN POTASSIUM 50 MG PO TABS: 50.0000 mg | ORAL_TABLET | Freq: Every day | ORAL | 1 refills | Status: DC

## 2018-10-08 ENCOUNTER — Telehealth: Payer: Self-pay | Admitting: Family Medicine

## 2018-10-08 DIAGNOSIS — E1165 Type 2 diabetes mellitus with hyperglycemia: Secondary | ICD-10-CM

## 2018-10-08 MED ORDER — TRESIBA FLEXTOUCH 100 UNIT/ML ~~LOC~~ SOPN: 10.0000 [IU] | PEN_INJECTOR | Freq: Every day | SUBCUTANEOUS | 0 refills | Status: DC

## 2018-10-08 MED ORDER — FARXIGA 5 MG PO TABS: 5.0000 mg | ORAL_TABLET | Freq: Every day | ORAL | 0 refills | Status: DC

## 2018-10-08 NOTE — Telephone Encounter (Signed)
Left message for pt that we have samples [place] and [place]

## 2018-11-04 ENCOUNTER — Encounter: Payer: Self-pay | Admitting: Gastroenterology

## 2018-11-10 NOTE — Progress Notes (Signed)
Subjective:    Patient ID: Jessica Hart, female    DOB: 1940/10/26, 78 y.o.   MRN: [id number]  Jessica Hart is a 78 y.o. female who presents for follow-up of Type 2 diabetes mellitus and other chronic health conditions.  HTN-  BP at home. Taking daily medications without any issues.  HL- taking statin daily and no issues.   GERD- omeprazole daily. Cut back on tomatoes and acidic foods.   Questions how long she would need to take Creon. Has not followed up with Dr. Silverio Decamp to find out. Will need to do this.   Patient is checking home blood sugars.  No episodes of hypoglycemia.   Home blood sugar records: BGs range between 111 and 245-ate sweets How often is blood sugars being checked: once a day Current symptoms include: weight loss. Patient denies increased appetite, nausea and visual disturbances.  Patient is checking their feet daily. Any Foot concerns (callous, ulcer, wound, thickened nails, toenail fungus, skin fungus, hammer toe): none Last dilated eye exam: February 2020  Current treatments: doing well on meds. Metformin 1,000 mg bid, Farxiga 5 mg once daily and Tresiba 12 units daily.  Medication compliance: good  Current diet: in general, a "healthy" diet   Current exercise: walking Known diabetic complications: none  The following portions of the patient's history were reviewed and updated as appropriate: allergies, current medications, past medical history, past social history and problem list.  ROS as in subjective above.     Objective:    Physical Exam Alert and oriented and in no distress. Cardiac exam shows a regular rhythm without murmurs or gallops. Lungs are clear to auscultation. Extremities without edema. Foot exam with normal sensation, pulses, ROM. Skin intact.    Blood pressure 124/76, pulse 71, temperature (!) 97.5 F (36.4 C), weight 171 lb 3.2 oz (77.7 kg).  Lab Review Diabetic Labs Latest Ref Rng & Units 11/11/2018 07/10/2018 04/09/2018  02/22/2018 11/15/2017  HbA1c 4.0 - 5.6 % 6.2(A) 7.9(A) 10.1(A) - 6.6(A)  Microalbumin Not estab mg/dL - - - - -  Micro/Creat Ratio <30 mcg/mg creat - - - - -  Chol 100 - 199 mg/dL - 144 - - 158  HDL >39 mg/dL - 32(L) - - 37(L)  Calc LDL 0 - 99 mg/dL - 72 - - 64  Triglycerides 0 - 149 mg/dL - 199(H) - - 287(H)  Creatinine 0.57 - 1.00 mg/dL - 0.67 0.77 0.73 0.80   BP/Weight 11/11/2018 08/21/2018 07/10/2018 04/23/2018 9/92/4268  Systolic BP 341 962 229 798 921  Diastolic BP 76 78 78 80 70  Wt. (Lbs) 171.2 180 179.8 180.8 180.6  BMI 30.33 31.89 31.85 32.03 31.99   Foot/eye exam completion dates Latest Ref Rng & Units 11/11/2018 04/25/2018  Eye Exam No Retinopathy - No Retinopathy  Foot Form Completion - Done -    Jessica Hart  reports that she quit smoking about 13 years ago. She has a 45.00 pack-year smoking history. She quit smokeless tobacco use about 13 years ago. She reports current alcohol use. She reports that she does not use drugs.     Assessment & Plan:    Controlled type 2 diabetes mellitus without complication, unspecified whether long term insulin use ([place]) - Plan: HgB A1c, Microalbumin / creatinine urine ratio  Hyperlipidemia associated with type 2 diabetes mellitus (HCC)  HYPERTENSION, BENIGN SYSTEMIC  Atherosclerosis  Gastroesophageal reflux disease, esophagitis presence not specified  Needs flu shot - Plan: Flu Vaccine QUAD High Dose(Fluad)  1. Rx  changes: reduce Tresiba from 12 units to 5 units. Continue Metformin 1,000 mg twice daily and Farxiga 5 mg daily.  Her Hgb A1c has improved to 6.2%.  2. Education: Reviewed 'ABCs' of diabetes management (respective goals in parentheses):  A1C (<7), blood pressure (<130/80), and cholesterol (LDL <100). 3. Compliance at present is estimated to be excellent. Efforts to improve compliance (if necessary) will be directed at dietary modifications: continue eating a low carbohydrate diet, increased exercise and regular blood sugar  monitoring: daily. 4. Eye exam is UTD- no retinopathy 5. Foot exam done. Urine microalbumin done 6. HTN- BP in goal range. Continue current medication 7. Congratulated her on weight loss and diet changes.  8. GERD- encouraged skipping omeprazole some days. Talk to Dr. Silverio Decamp about this as well.  9. HL- recent LDL 72. Continue on statin.  10. Encouraged her to call and schedule for follow up visits with cardiology and GI as recommended.  11. Follow up: 4 months for fasting AWV, CPE and diabetes.

## 2018-11-11 ENCOUNTER — Encounter: Payer: Self-pay | Admitting: Family Medicine

## 2018-11-11 ENCOUNTER — Other Ambulatory Visit: Payer: Self-pay

## 2018-11-11 ENCOUNTER — Ambulatory Visit (INDEPENDENT_AMBULATORY_CARE_PROVIDER_SITE_OTHER): Payer: Medicare HMO | Admitting: Family Medicine

## 2018-11-11 VITALS — BP 124/76 | HR 71 | Temp 97.5°F | Wt 171.2 lb

## 2018-11-11 DIAGNOSIS — E1169 Type 2 diabetes mellitus with other specified complication: Secondary | ICD-10-CM | POA: Diagnosis not present

## 2018-11-11 DIAGNOSIS — K219 Gastro-esophageal reflux disease without esophagitis: Secondary | ICD-10-CM | POA: Diagnosis not present

## 2018-11-11 DIAGNOSIS — I10 Essential (primary) hypertension: Secondary | ICD-10-CM | POA: Diagnosis not present

## 2018-11-11 DIAGNOSIS — E785 Hyperlipidemia, unspecified: Secondary | ICD-10-CM | POA: Diagnosis not present

## 2018-11-11 DIAGNOSIS — I7090 Unspecified atherosclerosis: Secondary | ICD-10-CM

## 2018-11-11 DIAGNOSIS — Z23 Encounter for immunization: Secondary | ICD-10-CM

## 2018-11-11 DIAGNOSIS — E119 Type 2 diabetes mellitus without complications: Secondary | ICD-10-CM

## 2018-11-11 DIAGNOSIS — R69 Illness, unspecified: Secondary | ICD-10-CM | POA: Diagnosis not present

## 2018-11-11 LAB — POCT GLYCOSYLATED HEMOGLOBIN (HGB A1C): Hemoglobin A1C: 6.2 % — AB (ref 4.0–5.6)

## 2018-11-11 MED ORDER — ONETOUCH DELICA PLUS LANCET33G MISC: 1.0000 "application " | Freq: Every day | 1 refills | Status: DC

## 2018-11-11 MED ORDER — ONETOUCH VERIO VI STRP: ORAL_STRIP | 1 refills | Status: DC

## 2018-11-11 NOTE — Patient Instructions (Addendum)
Your diabetes has improved. Your hemoglobin A1c is now 6.2%. reduce your Tresiba insulin dose to 5 units daily. Please continue to keep an eye on your blood sugars daily.  If you see them increasing, let me know.    Check with your pharmacy regarding the cost of the Tdap (tetanus, diptheria, and pertussis)   Call and schedule with Dr. Silverio Decamp as discussed.

## 2018-11-12 LAB — MICROALBUMIN / CREATININE URINE RATIO
Creatinine, Urine: 32.2 mg/dL
Microalb/Creat Ratio: 38 mg/g creat — ABNORMAL HIGH (ref 0–29)
Microalbumin, Urine: 12.2 ug/mL

## 2018-11-14 ENCOUNTER — Telehealth: Payer: Self-pay

## 2018-11-14 MED ORDER — METFORMIN HCL 1000 MG PO TABS: ORAL_TABLET | ORAL | 0 refills | Status: DC

## 2018-11-14 NOTE — Telephone Encounter (Signed)
[facility] on Metformin.

## 2018-11-19 ENCOUNTER — Telehealth: Payer: Self-pay | Admitting: Gastroenterology

## 2018-11-19 NOTE — Telephone Encounter (Signed)
Pt wants to know how long she needs to take the creon.  Please advise.

## 2018-11-19 NOTE — Telephone Encounter (Signed)
Pt wants to know how long does she need to continue taking creon.

## 2018-11-22 NOTE — Telephone Encounter (Signed)
She will need to stay on Creon for long-term for pancreatic insufficiency, please schedule follow-up visit next available appointment thanks

## 2018-11-22 NOTE — Telephone Encounter (Signed)
Left message for the patient to call back.

## 2018-11-25 ENCOUNTER — Telehealth: Payer: Self-pay | Admitting: Gastroenterology

## 2018-11-25 NOTE — Telephone Encounter (Signed)
Jessica Pole, MD to [name], RN     1:02 PM Note   She will need to stay on Creon for long-term for pancreatic insufficiency, please schedule follow-up visit next available appointment thanks     I have repeated the information per Dr Silverio Decamp and scheduled a follow up with Dr Silverio Decamp.  The pt has been advised of the information and verbalized understanding.

## 2018-11-28 NOTE — Telephone Encounter (Signed)
Pt did not call back. Appt scheduled for pt to see Dr. Silverio Decamp 12/11/18_0 :10am. Appt letter mailed to pt.

## 2018-12-02 ENCOUNTER — Telehealth: Payer: Self-pay | Admitting: Family Medicine

## 2018-12-02 DIAGNOSIS — Z794 Long term (current) use of insulin: Secondary | ICD-10-CM

## 2018-12-02 DIAGNOSIS — E1165 Type 2 diabetes mellitus with hyperglycemia: Secondary | ICD-10-CM

## 2018-12-02 MED ORDER — FARXIGA 5 MG PO TABS: 5.0000 mg | ORAL_TABLET | Freq: Every day | ORAL | 0 refills | Status: DC

## 2018-12-02 MED ORDER — METOPROLOL TARTRATE 100 MG PO TABS: 100.0000 mg | ORAL_TABLET | Freq: Two times a day (BID) | ORAL | 1 refills | Status: DC

## 2018-12-02 NOTE — Telephone Encounter (Signed)
Pt was notified and I have refilled metprolol and pt will come pick up samples

## 2018-12-02 NOTE — Telephone Encounter (Signed)
Pt called and needs refill on Metroprolo [facility] [address] Also needs samples of Farxiga 5 mg sample.  Please call pt [phone]

## 2018-12-11 ENCOUNTER — Other Ambulatory Visit: Payer: Self-pay

## 2018-12-11 ENCOUNTER — Encounter: Payer: Self-pay | Admitting: Gastroenterology

## 2018-12-11 ENCOUNTER — Ambulatory Visit: Payer: Medicare HMO | Admitting: Gastroenterology

## 2018-12-11 VITALS — BP 130/72 | HR 76 | Temp 98.3°F | Ht 63.0 in | Wt 173.4 lb

## 2018-12-11 DIAGNOSIS — R197 Diarrhea, unspecified: Secondary | ICD-10-CM

## 2018-12-11 DIAGNOSIS — K8689 Other specified diseases of pancreas: Secondary | ICD-10-CM

## 2018-12-11 DIAGNOSIS — E739 Lactose intolerance, unspecified: Secondary | ICD-10-CM | POA: Diagnosis not present

## 2018-12-11 MED ORDER — PANCRELIPASE (LIP-PROT-AMYL) 36000-114000 UNITS PO CPEP: ORAL_CAPSULE | ORAL | 11 refills | Status: DC

## 2018-12-11 NOTE — Progress Notes (Signed)
Jessica Hart    [id number]    1940/04/15  Primary Care Physician:Henson, Laurian Brim, NP-C  Referring Physician: Girtha Rm, NP-C [place],  [place] [id number]   Chief complaint:  Diarrhea, pancreatic insufficiency  HPI:  78 yr F with h/o pancreatic insufficiency here for follow-up visit.  She is taking Creon 72,000 units with meals and 36,000 units with snacks.  She continues to have increased bowel frequency with 2-3 bowel movements after each meal.  Denies any mucus or rectal bleeding. No unintentional weight loss.  Stopped Omeprazole No heartburn, vomiting, nausea, dysphagia, odynophagia or abdominal pain   GI work up: Stool GI pathogen panel negative for Infectious etiology  Colonoscopy with biopsies negative for microscopic colitis  Fecal elastase 124 suggestive of pancreatic insufficiency  Outpatient Encounter Medications as of 12/11/2018  Medication Sig  . acetaminophen (TYLENOL) 500 MG tablet Take 2 tablets (1,000 mg total) by mouth every 8 (eight) hours as needed. (Patient taking differently: Take 1,000 mg by mouth 2 (two) times daily after a meal. )  . amLODipine (NORVASC) 10 MG tablet Take 1 tablet (10 mg total) by mouth daily.  . dapagliflozin propanediol (FARXIGA) 5 MG TABS tablet Take 5 mg by mouth daily.  Marland Kitchen glucose blood (ONETOUCH VERIO) test strip USE 1 STRIP TO CHECK GLUCOSE ONCE DAILY  . insulin degludec (TRESIBA FLEXTOUCH) 100 UNIT/ML SOPN FlexTouch Pen Inject 0.1 mLs (10 Units total) into the skin daily after supper. (Patient taking differently: Inject 5 Units into the skin daily after supper. )  . Insulin Pen Needle (PEN NEEDLES 5/16") 30G X 8 MM MISC Pen needles used for lantus injections  . Lancets (ONETOUCH DELICA PLUS XNTZGY17C) MISC 1 application by Other route daily. Test blood sugar once a day  . lipase/protease/amylase (CREON) 36000 UNITS CPEP capsule Take 2 capsules with each meal three times daily  Take  1 capsule with each snack twice daily  . losartan (COZAAR) 50 MG tablet Take 1 tablet (50 mg total) by mouth daily.  Marland Kitchen lovastatin (MEVACOR) 20 MG tablet Take 1 tablet (20 mg total) by mouth daily.  . metFORMIN (GLUCOPHAGE) 1000 MG tablet TAKE 1 TABLET BY MOUTH TWICE DAILY WITH A MEAL  . metoprolol tartrate (LOPRESSOR) 100 MG tablet Take 1 tablet (100 mg total) by mouth 2 (two) times daily.  Marland Kitchen omeprazole (PRILOSEC) 40 MG capsule Take 1 capsule (40 mg total) by mouth daily.  Marland Kitchen tobramycin (TOBREX) 0.3 % ophthalmic solution Place 1 drop into the left eye every morning. Prn for dry eyes   No facility-administered encounter medications on file as of 12/11/2018.     Allergies as of 12/11/2018 - Review Complete 11/11/2018  Allergen Reaction Noted  . Lisinopril Itching and Cough 05/26/2009    Past Medical History:  Diagnosis Date  . Arthritis   . Cataract bilaterl 3 years ago  . Diabetes mellitus 02/27/2005  . Diabetes mellitus type 2, controlled, without complications ([place]) 9/44/9675   Qualifier: Diagnosis of  By: Drucie Ip    . Diverticulosis   . Echocardiogram findings abnormal, without diagnosis 2005   EF 47%, no ischemia, no infarction  . Former smoker 01/10/2017   45 pack year history, quit in 2007   . GERD (gastroesophageal reflux disease)   . History of bone density study 2006   Lt hip = -1.0, L spine = -1.8   . Hyperlipidemia   .  Hypertension     Past Surgical History:  Procedure Laterality Date  . ABDOMINAL HYSTERECTOMY  1989   one ovary remains per patient  . TONSILECTOMY, ADENOIDECTOMY, BILATERAL MYRINGOTOMY AND TUBES  1965    Family History  Problem Relation Age of Onset  . Heart disease Father   . Cancer Sister   . Tongue cancer Sister   . Colon polyps Neg Hx   . Rectal cancer Neg Hx   . Stomach cancer Neg Hx     Social History   Socioeconomic History  . Marital status: Married    Spouse name: Richard  . Number of children: 1  . Years of education:  76  . Highest education level: Not on file  Occupational History  . Occupation: Retire-Textile    Employer: RETIRED  Social Needs  . Financial resource strain: Not on file  . Food insecurity    Worry: Not on file    Inability: Not on file  . Transportation needs    Medical: Not on file    Non-medical: Not on file  Tobacco Use  . Smoking status: Former Smoker    Packs/day: 1.00    Years: 45.00    Pack years: 45.00    Quit date: 07/27/2005    Years since quitting: 13.3  . Smokeless tobacco: Former Systems developer    Quit date: 03/2005  Substance and Sexual Activity  . Alcohol use: Yes    Comment: beer occasionally   . Drug use: No  . Sexual activity: Not Currently  Lifestyle  . Physical activity    Days per week: Not on file    Minutes per session: Not on file  . Stress: Not on file  Relationships  . Social Herbalist on phone: Not on file    Gets together: Not on file    Attends religious service: Not on file    Active member of club or organization: Not on file    Attends meetings of clubs or organizations: Not on file    Relationship status: Not on file  . Intimate partner violence    Fear of current or ex partner: Not on file    Emotionally abused: Not on file    Physically abused: Not on file    Forced sexual activity: Not on file  Other Topics Concern  . Not on file  Social History Narrative   Health Care POA:    Emergency Contact: husband, Jessica Hart   End of Life Plan:    Who lives with you: Lives with husband   Any pets: gold fish   Diet: Patient has a varied diet and reports stuggeling with portion size and ice cream.   Exercise: Patient does not have currently exercise routine.   Seatbelts: Patient reports wearing seatbelt when in vehicle.   Sun Exposure/Protection: Patient reports not using sun protection.   Hobbies: Bingo            Review of systems: Review of Systems  Constitutional: Negative for fever and chills.  HENT: Negative.    Eyes: Negative for blurred vision.  Respiratory: Negative for cough, shortness of breath and wheezing.   Cardiovascular: Negative for chest pain and palpitations.  Gastrointestinal: as per HPI Genitourinary: Negative for dysuria, urgency, frequency and hematuria.  Musculoskeletal: Negative for myalgias, back pain and joint pain.  Skin: Negative for itching and rash.  Neurological: Negative for dizziness, tremors, focal weakness, seizures and loss of consciousness.  Endo/Heme/Allergies: Negative.  Psychiatric/Behavioral: Negative  for depression, suicidal ideas and hallucinations. Positive for insomnia All other systems reviewed and are negative.   Physical Exam: Vitals:   12/11/18 0811  BP: 130/72  Pulse: 76  Temp: 98.3 F (36.8 C)   Body mass index is 30.71 kg/m. Gen:      No acute distress HEENT:  EOMI, sclera anicteric Neck:     No masses; no thyromegaly Lungs:    Clear to auscultation bilaterally; normal respiratory effort CV:         Regular rate and rhythm; no murmurs Abd:      + bowel sounds; soft, non-tender; no palpable masses, no distension Ext:    No edema; adequate peripheral perfusion Skin:      Warm and dry; no rash Neuro: alert and oriented x 3 Psych: normal mood and affect  Data Reviewed:  Reviewed labs, radiology imaging, old records and pertinent past GI work up   Assessment and Plan/Recommendations:  78 year old female with obesity, diabetes on insulin and metformin, hyperlipidemia, chronic GERD with chronic diarrhea  Chronic diarrhea likely multifactorial Pancreatic insufficiency and possible lactose intolerance Continue Creon 72,000 units with meals and 36,000 units with snacks  Trial of lactose-free diet  If persistent increased bowel frequency, will consider trial of Colestid 1 g twice daily for possible bile salt induced diarrhea  Benefiber 1 tablespoon 3 times daily with meals  Return in 2 to 3 months or sooner if needed  25 minutes was  spent face-to-face with the patient. Greater than 50% of the time used for counseling as well as treatment plan and follow-up. She had multiple questions which were answered to her satisfaction  K. Denzil Magnuson , MD    CC: Girtha Rm, NP-C

## 2018-12-11 NOTE — Patient Instructions (Signed)
We will refill creon for you today   Lactose-Free Diet, Adult If you have lactose intolerance, you are not able to digest lactose. Lactose is a natural sugar found mainly in dairy milk and dairy products. You may need to avoid all foods and beverages that contain lactose. A lactose-free diet can help you do this. Which foods have lactose? Lactose is found in dairy milk and dairy products, such as:  Yogurt.  Cheese.  Butter.  Margarine.  Sour cream.  Cream.  Whipped toppings and nondairy creamers.  Ice cream and other dairy-based desserts. Lactose is also found in foods or products made with dairy milk or milk ingredients. To find out whether a food contains dairy milk or a milk ingredient, look at the ingredients list. Avoid foods with the statement "May contain milk" and foods that contain:  Milk powder.  Whey.  Curd.  Caseinate.  Lactose.  Lactalbumin.  Lactoglobulin. What are alternatives to dairy milk and foods made with milk products?  Lactose-free milk.  Soy milk with added calcium and vitamin D.  Almond milk, coconut milk, rice milk, or other nondairy milk alternatives with added calcium and vitamin D. Note that these are low in protein.  Soy products, such as soy yogurt, soy cheese, soy ice cream, and soy-based sour cream.  Other nut milk products, such as almond yogurt, almond cheese, cashew yogurt, cashew cheese, cashew ice cream, coconut yogurt, and coconut ice cream. What are tips for following this plan?  Do not consume foods, beverages, vitamins, minerals, or medicines containing lactose. Read ingredient lists carefully.  Look for the words "lactose-free" on labels.  Use lactase enzyme drops or tablets as directed by your health care provider.  Use lactose-free milk or a milk alternative, such as soy milk or almond milk, for drinking and cooking.  Make sure you get enough calcium and vitamin D in your diet. A lactose-free eating plan can be  lacking in these important nutrients.  Take calcium and vitamin D supplements as directed by your health care provider. Talk to your health care provider about supplements if you are not able to get enough calcium and vitamin D from food. What foods can I eat?  Fruits All fresh, canned, frozen, or dried fruits that are not processed with lactose. Vegetables All fresh, frozen, and canned vegetables without cheese, cream, or butter sauces. Grains Any that are not made with dairy milk or dairy products. Meats and other proteins Any meat, fish, poultry, and other protein sources that are not made with dairy milk or dairy products. Soy cheese and yogurt. Fats and oils Any that are not made with dairy milk or dairy products. Beverages Lactose-free milk. Soy, rice, or almond milk with added calcium and vitamin D. Fruit and vegetable juices. Sweets and desserts Any that are not made with dairy milk or dairy products. Seasonings and condiments Any that are not made with dairy milk or dairy products. Calcium Calcium is found in many foods that contain lactose and is important for bone health. The amount of calcium you need depends on your age:  Adults younger than 50 years: 1,000 mg of calcium a day.  Adults older than 50 years: 1,200 mg of calcium a day. If you are not getting enough calcium, you may get it from other sources, including:  Orange juice with calcium added. There are 300-350 mg of calcium in 1 cup of orange juice.  Calcium-fortified soy milk. There are 300-400 mg of calcium in 1 cup of  calcium-fortified soy milk.  Calcium-fortified rice or almond milk. There are 300 mg of calcium in 1 cup of calcium-fortified rice or almond milk.  Calcium-fortified breakfast cereals. There are 100-1,000 mg of calcium in calcium-fortified breakfast cereals.  Spinach, cooked. There are 145 mg of calcium in  cup of cooked spinach.  Edamame, cooked. There are 130 mg of calcium in  cup of  cooked edamame.  Collard greens, cooked. There are 125 mg of calcium in  cup of cooked collard greens.  Kale, frozen or cooked. There are 90 mg of calcium in  cup of cooked or frozen kale.  Almonds. There are 95 mg of calcium in  cup of almonds.  Broccoli, cooked. There are 60 mg of calcium in 1 cup of cooked broccoli. The items listed above may not be a complete list of recommended foods and beverages. Contact a dietitian for more options. What foods are not recommended? Fruits None, unless they are made with dairy milk or dairy products. Vegetables None, unless they are made with dairy milk or dairy products. Grains Any grains that are made with dairy milk or dairy products. Meats and other proteins None, unless they are made with dairy milk or dairy products. Dairy All dairy products, including milk, goat's milk, buttermilk, kefir, acidophilus milk, flavored milk, evaporated milk, condensed milk, dulce de [place], eggnog, yogurt, cheese, and cheese spreads. Fats and oils Any that are made with milk or milk products. Margarines and salad dressings that contain milk or cheese. Cream. Half and half. Cream cheese. Sour cream. Chip dips made with sour cream or yogurt. Beverages Hot chocolate. Cocoa with lactose. Instant iced teas. Powdered fruit drinks. Smoothies made with dairy milk or yogurt. Sweets and desserts Any that are made with milk or milk products. Seasonings and condiments Chewing gum that has lactose. Spice blends if they contain lactose. Artificial sweeteners that contain lactose. Nondairy creamers. The items listed above may not be a complete list of foods and beverages to avoid. Contact a dietitian for more information. Summary  If you are lactose intolerant, it means that you have a hard time digesting lactose, a natural sugar found in milk and milk products.  Following a lactose-free diet can help you manage this condition.  Calcium is important for bone health  and is found in many foods that contain lactose. Talk with your health care provider about other sources of calcium. This information is not intended to replace advice given to you by your health care provider. Make sure you discuss any questions you have with your health care provider. Document Released: 08/05/2001 Document Revised: 03/13/2017 Document Reviewed: 03/13/2017 Elsevier Patient Education  [place].  I appreciate the  opportunity to care for you  Thank You   Harl Bowie , MD

## 2018-12-12 ENCOUNTER — Telehealth: Payer: Self-pay | Admitting: *Deleted

## 2018-12-12 NOTE — Telephone Encounter (Signed)
Spoke with patient about her patient assistance paperwork she dropped of page 4 and 5 but we need the rest of the paperwork to fill out. She will drop off next week the rest of the patient assistant forms for [place] to fill out

## 2018-12-13 ENCOUNTER — Encounter: Payer: Self-pay | Admitting: Gastroenterology

## 2018-12-15 ENCOUNTER — Other Ambulatory Visit: Payer: Self-pay | Admitting: Family Medicine

## 2018-12-19 MED ORDER — PANCRELIPASE (LIP-PROT-AMYL) 36000-114000 UNITS PO CPEP: ORAL_CAPSULE | ORAL | 11 refills | Status: DC

## 2018-12-19 NOTE — Telephone Encounter (Signed)
Filled out forms and printed out rx waiting for Dr Silverio Decamp to sign

## 2018-12-20 NOTE — Telephone Encounter (Signed)
Patient Assistance Forms faxed today to [facility] [phone]

## 2018-12-23 ENCOUNTER — Other Ambulatory Visit: Payer: Self-pay | Admitting: Family Medicine

## 2018-12-28 NOTE — Progress Notes (Signed)
Cardiology Office Note   Date:  12/30/2018   ID:  Ravon, Mcilhenny 06/23/1940, MRN [id number]  PCP:  Girtha Rm, NP-C  Cardiologist: Dr.Acharya  CC: Follow Up   History of Present Illness: Jessica Hart is a 78 y.o. female who presents for ongoing assessment and management of hypertension, hyperlipidemia, with other history to include ongoing tobacco abuse and diabetes.  She was seen last on 08/21/2018 after coronary artery calcium was seen on CT scan for lung cancer screening.  She admitted to mild dyspnea on exertion with shopping or walking within the first 30 minutes but improved afterwards.  Also during showering.  At the time of that appointment the patient was offered alternative stress testing to evaluate for coronary artery calcifications however she wished to wait till her next appointment to discuss further as she was not having any significant symptoms.  We were also to follow-up with blood pressure evaluation as her blood pressure was slightly elevated during last office visit.  She was to continue lovastatin 20 mg daily as directed.  She denies new or worsening symptoms. She is not as active as she normal would like to be, but does take walks a few times a week. She denies severe chest pain, DOE, or dizziness.   Past Medical History:  Diagnosis Date  . Arthritis   . Cataract bilaterl 3 years ago  . Diabetes mellitus 02/27/2005  . Diabetes mellitus type 2, controlled, without complications ([place]) 0/34/7425   Qualifier: Diagnosis of  By: Drucie Ip    . Diverticulosis   . Echocardiogram findings abnormal, without diagnosis 2005   EF 47%, no ischemia, no infarction  . Former smoker 01/10/2017   45 pack year history, quit in 2007   . GERD (gastroesophageal reflux disease)   . History of bone density study 2006   Lt hip = -1.0, L spine = -1.8   . Hyperlipidemia   . Hypertension     Past Surgical History:  Procedure Laterality Date  . ABDOMINAL  HYSTERECTOMY  1989   one ovary remains per patient  . TONSILECTOMY, ADENOIDECTOMY, BILATERAL MYRINGOTOMY AND TUBES  1965     Current Outpatient Medications  Medication Sig Dispense Refill  . acetaminophen (TYLENOL) 500 MG tablet Take 2 tablets (1,000 mg total) by mouth every 8 (eight) hours as needed. (Patient taking differently: Take 1,000 mg by mouth 2 (two) times daily after a meal. ) 30 tablet 2  . amLODipine (NORVASC) 10 MG tablet TAKE 1 TABLET BY MOUTH EVERY DAY 90 tablet 0  . dapagliflozin propanediol (FARXIGA) 5 MG TABS tablet Take 5 mg by mouth daily. 28 tablet 0  . glucose blood (ONETOUCH VERIO) test strip USE 1 STRIP TO CHECK GLUCOSE ONCE DAILY 100 each 1  . insulin degludec (TRESIBA FLEXTOUCH) 100 UNIT/ML SOPN FlexTouch Pen Inject 0.1 mLs (10 Units total) into the skin daily after supper. (Patient taking differently: Inject 5 Units into the skin daily after supper. ) 2 pen 0  . Insulin Pen Needle (PEN NEEDLES 5/16") 30G X 8 MM MISC Pen needles used for lantus injections 100 each 3  . Lancets (ONETOUCH DELICA PLUS ZDGLOV56E) MISC 1 application by Other route daily. Test blood sugar once a day 100 each 1  . lipase/protease/amylase (CREON) 36000 UNITS CPEP capsule Take 2 capsules with each meal three times daily  Take 1 capsule with each snack twice daily 240 capsule 11  . losartan (COZAAR) 50 MG tablet Take 1 tablet (50 mg total)  by mouth daily. 90 tablet 1  . lovastatin (MEVACOR) 20 MG tablet TAKE ONE TABLET BY MOUTH ONCE DAILY 90 tablet 0  . metFORMIN (GLUCOPHAGE) 1000 MG tablet TAKE 1 TABLET BY MOUTH TWICE DAILY WITH A MEAL 180 tablet 0  . metoprolol tartrate (LOPRESSOR) 100 MG tablet Take 1 tablet (100 mg total) by mouth 2 (two) times daily. 180 tablet 1  . omeprazole (PRILOSEC) 40 MG capsule Take 1 capsule (40 mg total) by mouth daily. 90 capsule 1  . tobramycin (TOBREX) 0.3 % ophthalmic solution Place 1 drop into the left eye every morning. Prn for dry eyes     No current  facility-administered medications for this visit.     Allergies:   Lisinopril    Social History:  The patient  reports that she quit smoking about 13 years ago. She has a 45.00 pack-year smoking history. She quit smokeless tobacco use about 13 years ago. She reports current alcohol use. She reports that she does not use drugs.   Family History:  The patient's family history includes Cancer in her sister; Heart disease in her father; Tongue cancer in her sister.    ROS: All other systems are reviewed and negative. Unless otherwise mentioned in H&P    PHYSICAL EXAM: VS:  BP 130/70   Pulse 68   Ht _0  (1.6 m)   Wt 174 lb 3.2 oz (79 kg)   BMI 30.86 kg/m  , BMI Body mass index is 30.86 kg/m. GEN: Well nourished, well developed, in no acute distress HEENT: normal Neck: no JVD, right carotid bruit, or masses Cardiac: RRR; soft systolic murmurs, heard best at the apex, no rubs, or gallops,no edema Diminished distal pulses.  Respiratory:  Clear to auscultation bilaterally, normal work of breathing GI: soft, nontender, nondistended, + BS MS: no deformity or atrophy Skin: warm and dry, no rash Neuro:  Strength and sensation are intact Psych: euthymic mood, full affect   EKG: NSR, LAE, questionable inferior and anterior infarct age undetermined. Rate of 68 bpm.   Recent Labs: 04/09/2018: Hemoglobin 14.1; Platelets 320; TSH 1.740 07/10/2018: ALT 11; BUN 10; Creatinine, Ser 0.67; Potassium 3.5; Sodium 145    Lipid Panel    Component Value Date/Time   CHOL 144 07/10/2018 0956   TRIG 199 (H) 07/10/2018 0956   HDL 32 (L) 07/10/2018 0956   CHOLHDL 4.5 (H) 07/10/2018 0956   CHOLHDL 4.4 08/29/2016 0920   VLDL 28 08/29/2016 0920   LDLCALC 72 07/10/2018 0956   LDLDIRECT 81 03/21/2013 1431      Wt Readings from Last 3 Encounters:  12/30/18 174 lb 3.2 oz (79 kg)  12/11/18 173 lb 6 oz (78.6 kg)  11/11/18 171 lb 3.2 oz (77.7 kg)      Other studies Reviewed: Echocardiogram  02/26/2018  Left ventricle: The cavity size was normal. Wall thickness was   increased in a pattern of mild LVH. Systolic function was   vigorous. The estimated ejection fraction was in the range of 65%   to 70%. Wall motion was normal; there were no regional wall   motion abnormalities. Doppler parameters are consistent with   abnormal left ventricular relaxation (grade 1 diastolic   dysfunction). - Aortic valve: Trileaflet; mildly thickened, mildly calcified   leaflets.  Bilateral ABI's12/19/2018  Right: Resting right ankle-brachial index is within normal range. No evidence of significant right lower extremity arterial disease. The right toe-brachial index is abnormal.   Left: Resting left ankle-brachial index is within normal range.  No evidence of significant left lower extremity arterial disease. The left toe-brachial index is abnormal.  ASSESSMENT AND PLAN:  1.  CAD: Found incidentally on CT of the chest for cancer screening. Coronary CTA was planned but HR was too high to continue the test. Mrs. Stefano would still like to hold off on any ischemic testing for fears concerning COVID pandemic. She did not tolerate pharmacologic stress myoview due to symptoms of severe dyspnea.   As she has no new symptoms or worsening symptoms will not proceed at this time. However, I have reinforced that if her symptoms worsen or new or severe chest pain occurs we will proceed with ischemic testing more aggressively.    2. Right carotid bruit: Auscultated today on exam. Will plan a carotid ultrasound for further assessment.   3. Hypertension: BP is currently well controlled. No changes in her regimen.   Current medicines are reviewed at length with the patient today.    Labs/ tests ordered today include: Carotid ultrasound   Phill Myron. West Pugh, ANP, AACC   12/30/2018 10:10 AM    [address] Office [phone] Fax [phone]   Notice: This dictation was prepared with Dragon dictation along with smaller phrase technology. Any transcriptional errors that result from this process are unintentional and may not be corrected upon review.

## 2018-12-30 ENCOUNTER — Other Ambulatory Visit: Payer: Self-pay

## 2018-12-30 ENCOUNTER — Encounter: Payer: Self-pay | Admitting: Adult Health

## 2018-12-30 ENCOUNTER — Ambulatory Visit: Payer: Medicare HMO | Admitting: Adult Health

## 2018-12-30 VITALS — BP 130/70 | HR 68 | Ht 63.0 in | Wt 174.2 lb

## 2018-12-30 DIAGNOSIS — R0989 Other specified symptoms and signs involving the circulatory and respiratory systems: Secondary | ICD-10-CM | POA: Diagnosis not present

## 2018-12-30 DIAGNOSIS — I10 Essential (primary) hypertension: Secondary | ICD-10-CM

## 2018-12-30 NOTE — Patient Instructions (Signed)
Medication Instructions:  Continue current medications  *If you need a refill on your cardiac medications before your next appointment, please call your pharmacy*  Lab Work: None Ordered  Testing/Procedures: Your physician has requested that you have a carotid duplex. This test is an ultrasound of the carotid arteries in your neck. It looks at blood flow through these arteries that supply the brain with blood. Allow one hour for this exam. There are no restrictions or special instructions.  Follow-Up: At [facility], you and your health needs are our priority.  As part of our continuing mission to provide you with exceptional heart care, we have created designated Provider Care Teams.  These Care Teams include your primary Cardiologist (physician) and Advanced Practice Providers (APPs -  Physician Assistants and Nurse Practitioners) who all work together to provide you with the care you need, when you need it.  Your next appointment:   1 Month  The format for your next appointment:   In Person  Provider:   Cherlynn Kaiser, MD

## 2019-01-03 ENCOUNTER — Other Ambulatory Visit: Payer: Self-pay

## 2019-01-03 ENCOUNTER — Ambulatory Visit (HOSPITAL_COMMUNITY)
Admission: RE | Admit: 2019-01-03 | Discharge: 2019-01-03 | Disposition: A | Discharge status: Home / Self Care | Payer: Medicare HMO | Source: Ambulatory Visit | Attending: Internal Medicine | Admitting: Internal Medicine

## 2019-01-03 DIAGNOSIS — R0989 Other specified symptoms and signs involving the circulatory and respiratory systems: Secondary | ICD-10-CM | POA: Diagnosis not present

## 2019-01-15 ENCOUNTER — Ambulatory Visit: Payer: Medicare HMO | Admitting: Family Medicine

## 2019-01-29 ENCOUNTER — Encounter: Payer: Self-pay | Admitting: Family Medicine

## 2019-01-29 ENCOUNTER — Other Ambulatory Visit: Payer: Self-pay

## 2019-01-29 ENCOUNTER — Ambulatory Visit (INDEPENDENT_AMBULATORY_CARE_PROVIDER_SITE_OTHER): Payer: Medicare HMO | Admitting: Family Medicine

## 2019-01-29 VITALS — BP 124/80 | HR 88 | Temp 97.6°F | Resp 12 | Ht 63.0 in | Wt 171.6 lb

## 2019-01-29 DIAGNOSIS — E559 Vitamin D deficiency, unspecified: Secondary | ICD-10-CM | POA: Diagnosis not present

## 2019-01-29 DIAGNOSIS — I2510 Atherosclerotic heart disease of native coronary artery without angina pectoris: Secondary | ICD-10-CM | POA: Diagnosis not present

## 2019-01-29 DIAGNOSIS — Z1231 Encounter for screening mammogram for malignant neoplasm of breast: Secondary | ICD-10-CM | POA: Diagnosis not present

## 2019-01-29 DIAGNOSIS — E1169 Type 2 diabetes mellitus with other specified complication: Secondary | ICD-10-CM | POA: Diagnosis not present

## 2019-01-29 DIAGNOSIS — E119 Type 2 diabetes mellitus without complications: Secondary | ICD-10-CM

## 2019-01-29 DIAGNOSIS — I10 Essential (primary) hypertension: Secondary | ICD-10-CM

## 2019-01-29 DIAGNOSIS — L819 Disorder of pigmentation, unspecified: Secondary | ICD-10-CM | POA: Diagnosis not present

## 2019-01-29 DIAGNOSIS — E785 Hyperlipidemia, unspecified: Secondary | ICD-10-CM | POA: Diagnosis not present

## 2019-01-29 DIAGNOSIS — Z0000 Encounter for general adult medical examination without abnormal findings: Secondary | ICD-10-CM | POA: Diagnosis not present

## 2019-01-29 DIAGNOSIS — E2839 Other primary ovarian failure: Secondary | ICD-10-CM | POA: Diagnosis not present

## 2019-01-29 DIAGNOSIS — N898 Other specified noninflammatory disorders of vagina: Secondary | ICD-10-CM | POA: Diagnosis not present

## 2019-01-29 NOTE — Progress Notes (Signed)
Jessica Hart is a 78 y.o. female who presents for annual wellness visit, CPE and follow-up on chronic medical conditions.  She has the following concerns:  Having mild shortness of breath with exertion. This is chronic and unchanged.  She is scheduled for a low dose CT chest and follow up with cardiology next week.   DM- 121, 111  Metformin 1,000 mg twice daily  Farxiga 5 mg once daily  Taking only 5 units of Tresiba 3 nights per week on average.   HTN- reports good daily compliance with medications. Amlodipine 10 mg, motoprolol 100 mg, losartan 50 mg  HL and atherosclerosis- taking statin daily. Followed by cardiology  Denies any claudication symptoms. No LE edema, numbness, tingling or burning. No neuropathy   Concerns today regarding new problem of itching in perineum and skin discoloration. She has been using vaseline for this problem.   She has not updated Tdap due to it being too expensive but aware she is overdue.  Declines another pneumonia vaccine.    Immunization History  Administered Date(s) Administered  . Fluad Quad(high Dose 65+) 11/11/2018  . Influenza Whole 12/11/2007  . Influenza, High Dose Seasonal PF 02/29/2016, 01/10/2017, 11/15/2017  . Influenza,inj,Quad PF,6+ Mos 10/29/2012, 04/06/2014, 02/19/2015  . Pneumococcal Conjugate-13 04/06/2014  . Pneumococcal Polysaccharide-23 12/11/2007  . Td 05/28/2004   Last Pap smear: hysterectomy  Last mammogram: 03/01/16 Last colonoscopy: 01/01/18 Last DEXA: 03/01/16 and normal  Dentist: no dentist Ophtho: 04/25/18 Exercise: none lately   Other doctors caring for patient include:  Curt Bears [name]/Cardiology, Laymond Purser Shapiro/Ophth   Depression screen:  See questionnaire below.  Depression screen [facility] 2/9 01/29/2019 01/14/2018 01/10/2017 12/29/2015 05/13/2015  Decreased Interest 0 0 0 0 0  Down, Depressed, Hopeless 0 0 0 0 0  PHQ - 2 Score 0 0 0 0 0    Fall Risk Screen: see questionnaire  below. Fall Risk  01/29/2019 01/14/2018 01/10/2017 12/29/2015 05/13/2015  Falls in the past year? 0 0 No No No  Number falls in past yr: 0 0 - - -  Injury with Fall? 0 0 - - -    ADL screen:  See questionnaire below Functional Status Survey:     End of Life Discussion:  Patient does not have a living will and medical power of attorney. Is not interested in discussing this. Has paperwork at home.   Review of Systems Constitutional: -fever, -chills, -sweats, -unexpected weight change, -anorexia, -fatigue Allergy: -sneezing, -itching, -congestion Dermatology: denies changing moles, rash, lumps ENT: -runny nose, -ear pain, -sore throat, -hoarseness, -sinus pain, -teeth pain, -tinnitus, -hearing loss, -epistaxis Cardiology:  -chest pain, -palpitations, -edema, -orthopnea, -paroxysmal nocturnal dyspnea Respiratory: -cough, -shortness of breath, +dyspnea on exertion, -wheezing, -hemoptysis Gastroenterology: -abdominal pain, -nausea, -vomiting, -diarrhea, -constipation, -blood in stool, -changes in bowel movement, -dysphagia Hematology: -bleeding or bruising problems Musculoskeletal: -arthralgias, -myalgias, -joint swelling, -back pain, -neck pain, -cramping, -gait changes Ophthalmology: -vision changes, -eye redness, -itching, -discharge Urology: -dysuria, -difficulty urinating, -hematuria, -urinary frequency, -urgency, incontinence Neurology: -headache, -weakness, -tingling, -numbness, -speech abnormality, -memory loss, -falls, -dizziness Psychology:  -depressed mood, -agitation, -sleep problems    PHYSICAL EXAM:  BP 124/80   Pulse 88   Temp 97.6 F (36.4 C)   Resp 12   Ht _0  (1.6 m)   Wt 171 lb 9.6 oz (77.8 kg)   SpO2 96%   BMI 30.40 kg/m   General Appearance: Alert, cooperative, no distress, appears stated age Head: Normocephalic, without obvious abnormality, atraumatic Eyes: PERRL,  conjunctiva/corneas clear, EOM's intact Ears: Normal TM's and external ear canals Nose:  mask in place  Throat: mask in place  Neck: Supple, no lymphadenopathy; thyroid: no enlargement/tenderness/nodules; no carotid bruit or JVD Back: Spine nontender, no curvature, ROM normal, no CVA tenderness Lungs: Clear to auscultation bilaterally without wheezes, rales or ronchi; respirations unlabored Chest Wall: No tenderness or deformity Heart: Regular rate and rhythm, S1 and S2 normal, no murmur, rub or gallop Breast Exam: No tenderness, masses, or nipple discharge or inversion. No axillary lymphadenopathy Abdomen: Soft, non-tender, nondistended, normoactive bowel sounds, no masses, no hepatosplenomegaly Genitalia: External genitalia with hypopigmentation in perineum  Extremities: No clubbing, cyanosis or edema. Normal diabetic foot exam.  Pulses: 2+ and symmetric all extremities Skin: Skin color, texture, turgor normal, no rashes or lesions Lymph nodes: Cervical, supraclavicular, and axillary nodes normal Neurologic: CNII-XII intact, normal strength, sensation and gait; reflexes 2+ and symmetric throughout Psych: Normal mood, affect, hygiene and grooming.  ASSESSMENT/PLAN: Medicare annual wellness visit, subsequent -here today for a fasting AWV. No falls, depression or difficulty with ADLs  No memory concerns. Advance directives discussed.   Routine general medical examination at a health care facility - Plan: CBC with Differential, Comprehensive metabolic panel, TSH, Lipid Panel -preventive health care reviewed. She will call to schedule mammogram. Pap smear not indicated. DEXA normal in 2018. Colonoscopy UTD. Immunizations reviewed. She is aware that she should update Tdap at pharmacy if affordable. Counseling on healthy lifestyle. Encouraged her to be more active.   Essential hypertension - Plan: CBC with Differential, Comprehensive metabolic panel -BP in goal range. Continue on current medications and follow up with cardiology   Controlled type 2 diabetes mellitus without  complication, unspecified whether long term insulin use ([place]) - Plan: TSH, Hemoglobin A1c -continue on current medication regimen and adjust medication dose as appropriate pending Hgb A1c.   Hyperlipidemia associated with type 2 diabetes mellitus ([place]) - Plan: Lipid Panel -continue on statin therapy. Check lipids  Coronary artery calcification seen on CAT scan -managed by cardiology   Encounter for screening mammogram for malignant neoplasm of breast - Plan: MM Digital Screening -she will call to schedule mammogram   Vitamin D deficiency - Plan: Vitamin D, 25-hydroxy -continue on vitamin D supplement and follow up pending result  Estrogen deficiency - Plan: Vitamin D, 25-hydroxy -history of normal DEXA.   Vagina itching - Plan: Ambulatory referral to Gynecology  Hypopigmented skin lesion - Plan: Ambulatory referral to Gynecology Suspicious skin condition with consideration for lichen sclerosis. Will refer to gyn for further evaluation     Discussed monthly self breast exams and yearly mammograms; at least 30 minutes of aerobic activity at least 5 days/week and weight-bearing exercise 2x/week; proper sunscreen use reviewed; healthy diet, including goals of calcium and vitamin D intake and alcohol recommendations (less than or equal to 1 drink/day) reviewed; regular seatbelt use; changing batteries in smoke detectors.  Immunization recommendations discussed.  Colonoscopy recommendations reviewed   Medicare Attestation I have personally reviewed: The patient's medical and social history Their use of alcohol, tobacco or illicit drugs Their current medications and supplements The patient's functional ability including ADLs,fall risks, home safety risks, cognitive, and hearing and visual impairment Diet and physical activities Evidence for depression or mood disorders  The patient's weight, height, and BMI have been recorded in the chart.  I have made referrals, counseling, and  provided education to the patient based on review of the above and I have provided the patient with a written personalized  care plan for preventive services.     Harland Dingwall, NP-C   01/29/2019

## 2019-01-29 NOTE — Patient Instructions (Signed)
  [name] , Thank you for taking time to come for your Medicare Wellness Visit. I appreciate your ongoing commitment to your health goals. Please review the following plan we discussed and let me know if I can assist you in the future.   These are the goals we discussed:  Call and schedule your mammogram at the breast center You will receive a call from the gynecologist to schedule a visit.  Check with your insurance carrier regarding the Tdap vaccine.  You are overdue for this.  You can get this at your pharmacy if it is affordable.  Look for ways to increase your physical activity whether this is within indoor bicycle or walking.  Continue getting at least 1200 mg of calcium in your diet daily and if you are not doing this, you may need to take a calcium supplement.  Do not take more than 500 mg of a calcium supplement at any one time. I also recommend that you get at least 1000 international units of vitamin D daily.  Follow-up with your cardiologist as scheduled next week.  Continue on your current medications.  We will call you with your results.    This is a list of the screening recommended for you and due dates:  Health Maintenance  Topic Date Due  . Tetanus Vaccine  05/29/2014  . Lipid (cholesterol) test  01/10/2019  . Eye exam for diabetics  04/26/2019  . Hemoglobin A1C  05/11/2019  . Complete foot exam   11/11/2019  . Flu Shot  Completed  . DEXA scan (bone density measurement)  Completed  . Pneumonia vaccines  Completed

## 2019-01-30 LAB — COMPREHENSIVE METABOLIC PANEL
ALT: 11 IU/L (ref 0–32)
AST: 15 IU/L (ref 0–40)
Albumin/Globulin Ratio: 1.5 (ref 1.2–2.2)
Albumin: 4.8 g/dL — ABNORMAL HIGH (ref 3.7–4.7)
Alkaline Phosphatase: 119 IU/L — ABNORMAL HIGH (ref 39–117)
BUN/Creatinine Ratio: 13 (ref 12–28)
BUN: 9 mg/dL (ref 8–27)
Bilirubin Total: 0.6 mg/dL (ref 0.0–1.2)
CO2: 23 mmol/L (ref 20–29)
Calcium: 10 mg/dL (ref 8.7–10.3)
Chloride: 102 mmol/L (ref 96–106)
Creatinine, Ser: 0.71 mg/dL (ref 0.57–1.00)
GFR calc Af Amer: 96 mL/min/{1.73_m2} (ref >59)
GFR calc non Af Amer: 83 mL/min/{1.73_m2} (ref >59)
Globulin, Total: 3.2 g/dL (ref 1.5–4.5)
Glucose: 153 mg/dL — ABNORMAL HIGH (ref 65–99)
Potassium: 3.8 mmol/L (ref 3.5–5.2)
Sodium: 141 mmol/L (ref 134–144)
Total Protein: 8 g/dL (ref 6.0–8.5)

## 2019-01-30 LAB — CBC WITH DIFFERENTIAL/PLATELET
Basophils Absolute: 0.1 10*3/uL (ref 0.0–0.2)
Basos: 1 %
EOS (ABSOLUTE): 0.2 10*3/uL (ref 0.0–0.4)
Eos: 2 %
Hematocrit: 47.1 % — ABNORMAL HIGH (ref 34.0–46.6)
Hemoglobin: 15.6 g/dL (ref 11.1–15.9)
Immature Grans (Abs): 0 10*3/uL (ref 0.0–0.1)
Immature Granulocytes: 0 %
Lymphocytes Absolute: 1.9 10*3/uL (ref 0.7–3.1)
Lymphs: 24 %
MCH: 28.1 pg (ref 26.6–33.0)
MCHC: 33.1 g/dL (ref 31.5–35.7)
MCV: 85 fL (ref 79–97)
Monocytes Absolute: 0.7 10*3/uL (ref 0.1–0.9)
Monocytes: 9 %
Neutrophils Absolute: 5 10*3/uL (ref 1.4–7.0)
Neutrophils: 64 %
Platelets: 339 10*3/uL (ref 150–450)
RBC: 5.55 x10E6/uL — ABNORMAL HIGH (ref 3.77–5.28)
RDW: 14.4 % (ref 11.7–15.4)
WBC: 7.8 10*3/uL (ref 3.4–10.8)

## 2019-01-30 LAB — LIPID PANEL
Chol/HDL Ratio: 4 ratio (ref 0.0–4.4)
Cholesterol, Total: 153 mg/dL (ref 100–199)
HDL: 38 mg/dL — ABNORMAL LOW (ref >39)
LDL Chol Calc (NIH): 79 mg/dL (ref 0–99)
Triglycerides: 216 mg/dL — ABNORMAL HIGH (ref 0–149)
VLDL Cholesterol Cal: 36 mg/dL (ref 5–40)

## 2019-01-30 LAB — TSH: TSH: 0.923 u[IU]/mL (ref 0.450–4.500)

## 2019-01-30 LAB — HEMOGLOBIN A1C
Est. average glucose Bld gHb Est-mCnc: 148 mg/dL
Hgb A1c MFr Bld: 6.8 % — ABNORMAL HIGH (ref 4.8–5.6)

## 2019-01-30 LAB — VITAMIN D 25 HYDROXY (VIT D DEFICIENCY, FRACTURES): Vit D, 25-Hydroxy: 28.1 ng/mL — ABNORMAL LOW (ref 30.0–100.0)

## 2019-02-03 ENCOUNTER — Ambulatory Visit: Payer: Medicare HMO | Admitting: Internal Medicine

## 2019-02-03 ENCOUNTER — Other Ambulatory Visit: Payer: Self-pay

## 2019-02-03 VITALS — BP 136/70 | HR 69 | Temp 97.1°F | Ht 63.0 in | Wt 171.0 lb

## 2019-02-03 DIAGNOSIS — I10 Essential (primary) hypertension: Secondary | ICD-10-CM

## 2019-02-03 DIAGNOSIS — E1169 Type 2 diabetes mellitus with other specified complication: Secondary | ICD-10-CM | POA: Diagnosis not present

## 2019-02-03 DIAGNOSIS — Z87891 Personal history of nicotine dependence: Secondary | ICD-10-CM

## 2019-02-03 DIAGNOSIS — I2584 Coronary atherosclerosis due to calcified coronary lesion: Secondary | ICD-10-CM

## 2019-02-03 DIAGNOSIS — E119 Type 2 diabetes mellitus without complications: Secondary | ICD-10-CM

## 2019-02-03 DIAGNOSIS — Z794 Long term (current) use of insulin: Secondary | ICD-10-CM | POA: Diagnosis not present

## 2019-02-03 DIAGNOSIS — E785 Hyperlipidemia, unspecified: Secondary | ICD-10-CM | POA: Diagnosis not present

## 2019-02-03 DIAGNOSIS — I2510 Atherosclerotic heart disease of native coronary artery without angina pectoris: Secondary | ICD-10-CM | POA: Diagnosis not present

## 2019-02-03 NOTE — Progress Notes (Signed)
Cardiology Office Note:    Date:  02/03/2019   ID:  Jessica, Hart [birth year], MRN [id number]  PCP:  Jessica Rm, NP-C  Cardiologist:  No primary care provider on file.  Electrophysiologist:  None   Referring MD: Jessica Rm, NP-C   Chief Complaint: Follow-up coronary artery calcifications  History of Present Illness:    Jessica Hart is a 78 y.o. female with a hx of diabetes mellitus,hypertension and hyperlipidemia, as well as coronary artery calcification seen on a lung cancer screening CT.   We attempted to get a CT coronary angiogram to further evaluate however patient's heart rate was not optimal and the study was canceled.  Since her last appointment she has been seen by my colleague Jessica Domino, NP where she endorsed mild dyspnea on exertion.  Pharmacologic stress Myoview was not tolerated due to severe dyspnea per Jessica Hart's note.  The patient overall feels well and has no significant complaints.  She had a carotid ultrasound which showed no significant carotid artery disease.  The patient denies chest pain, chest pressure, dyspnea at rest or with exertion, palpitations, PND, orthopnea, or leg swelling. Denies syncope or presyncope. Denies dizziness or lightheadedness.   Past Medical History:  Diagnosis Date  . Arthritis   . Cataract bilaterl 3 years ago  . Diabetes mellitus 02/27/2005  . Diabetes mellitus type 2, controlled, without complications ([place]) 8/46/6599   Qualifier: Diagnosis of  By: Drucie Ip    . Diverticulosis   . Echocardiogram findings abnormal, without diagnosis 2005   EF 47%, no ischemia, no infarction  . Former smoker 01/10/2017   45 pack year history, quit in 2007   . GERD (gastroesophageal reflux disease)   . History of bone density study 2006   Lt hip = -1.0, L spine = -1.8   . Hyperlipidemia   . Hypertension     Past Surgical History:  Procedure Laterality Date  . ABDOMINAL HYSTERECTOMY  1989   one  ovary remains per patient  . TONSILECTOMY, ADENOIDECTOMY, BILATERAL MYRINGOTOMY AND TUBES  1965    Current Medications: Current Meds  Medication Sig  . acetaminophen (TYLENOL) 500 MG tablet Take 2 tablets (1,000 mg total) by mouth every 8 (eight) hours as needed. (Patient taking differently: Take 1,000 mg by mouth 2 (two) times daily after a meal. )  . amLODipine (NORVASC) 10 MG tablet TAKE 1 TABLET BY MOUTH EVERY DAY  . dapagliflozin propanediol (FARXIGA) 5 MG TABS tablet Take 5 mg by mouth daily.  Marland Kitchen glucose blood (ONETOUCH VERIO) test strip USE 1 STRIP TO CHECK GLUCOSE ONCE DAILY  . insulin degludec (TRESIBA FLEXTOUCH) 100 UNIT/ML SOPN FlexTouch Pen Inject 0.1 mLs (10 Units total) into the skin daily after supper. (Patient taking differently: Inject 5 Units into the skin daily after supper. )  . Insulin Pen Needle (PEN NEEDLES 5/16") 30G X 8 MM MISC Pen needles used for lantus injections  . Lancets (ONETOUCH DELICA PLUS [id number]) MISC 1 application by Other route daily. Test blood sugar once a day  . lipase/protease/amylase (CREON) 36000 UNITS CPEP capsule Take 2 capsules with each meal three times daily  Take 1 capsule with each snack twice daily  . losartan (COZAAR) 50 MG tablet Take 1 tablet (50 mg total) by mouth daily.  Marland Kitchen lovastatin (MEVACOR) 20 MG tablet TAKE ONE TABLET BY MOUTH ONCE DAILY  . metFORMIN (GLUCOPHAGE) 1000 MG tablet TAKE 1 TABLET BY MOUTH TWICE DAILY WITH A MEAL  . metoprolol tartrate (  LOPRESSOR) 100 MG tablet Take 1 tablet (100 mg total) by mouth 2 (two) times daily.  Marland Kitchen omeprazole (PRILOSEC) 40 MG capsule Take 1 capsule (40 mg total) by mouth daily.  Marland Kitchen tobramycin (TOBREX) 0.3 % ophthalmic solution Place 1 drop into the left eye every morning. Prn for dry eyes     Allergies:   Lisinopril   Social History   Socioeconomic History  . Marital status: Married    Spouse name: Jessica  . Number of children: 1  . Years of education: 45  . Highest education level: Not on  file  Occupational History  . Occupation: Retire-Textile    Employer: RETIRED  Social Needs  . Financial resource strain: Not on file  . Food insecurity    Worry: Not on file    Inability: Not on file  . Transportation needs    Medical: Not on file    Non-medical: Not on file  Tobacco Use  . Smoking status: Former Smoker    Packs/day: 1.00    Years: 45.00    Pack years: 45.00    Quit date: 07/27/2005    Years since quitting: 13.5  . Smokeless tobacco: Former Systems developer    Quit date: 03/2005  Substance and Sexual Activity  . Alcohol use: Yes    Comment: beer occasionally   . Drug use: No  . Sexual activity: Not Currently  Lifestyle  . Physical activity    Days per week: Not on file    Minutes per session: Not on file  . Stress: Not on file  Relationships  . Social Herbalist on phone: Not on file    Gets together: Not on file    Attends religious service: Not on file    Active member of club or organization: Not on file    Attends meetings of clubs or organizations: Not on file    Relationship status: Not on file  Other Topics Concern  . Not on file  Social History Narrative   Health Care POA:    Emergency Contact: husband, Jessica Hart   End of Life Plan:    Who lives with you: Lives with husband   Any pets: gold fish   Diet: Patient has a varied diet and reports stuggeling with portion size and ice cream.   Exercise: Patient does not have currently exercise routine.   Seatbelts: Patient reports wearing seatbelt when in vehicle.   Sun Exposure/Protection: Patient reports not using sun protection.   Hobbies: Bingo           Family History: The patient's family history includes Cancer in her sister; Heart disease in her father; Tongue cancer in her sister. There is no history of Colon polyps, Rectal cancer, or Stomach cancer.  ROS:   Please see the history of present illness.    All other systems reviewed and are negative.  EKGs/Labs/Other Studies  Reviewed:    The following studies were reviewed today:  EKG: Not performed today    Recent Labs: 01/29/2019: ALT 11; BUN 9; Creatinine, Ser 0.71; Hemoglobin 15.6; Platelets 339; Potassium 3.8; Sodium 141; TSH 0.923  Recent Lipid Panel    Component Value Date/Time   CHOL 153 01/29/2019 1057   TRIG 216 (H) 01/29/2019 1057   HDL 38 (L) 01/29/2019 1057   CHOLHDL 4.0 01/29/2019 1057   CHOLHDL 4.4 08/29/2016 0920   VLDL 28 08/29/2016 0920   LDLCALC 79 01/29/2019 1057   LDLDIRECT 81 03/21/2013 1431  Physical Exam:    VS:  BP 136/70 (BP Location: Left Arm, Patient Position: Sitting, Cuff Size: Normal)   Pulse 69   Temp (!) 97.1 F (36.2 C)   Ht _0  (1.6 m)   Wt 171 lb (77.6 kg)   BMI 30.29 kg/m     Wt Readings from Last 5 Encounters:  02/03/19 171 lb (77.6 kg)  01/29/19 171 lb 9.6 oz (77.8 kg)  12/30/18 174 lb 3.2 oz (79 kg)  12/11/18 173 lb 6 oz (78.6 kg)  11/11/18 171 lb 3.2 oz (77.7 kg)     Constitutional: No acute distress Eyes: sclera non-icteric, normal conjunctiva and lids ENMT: normal dentition, moist mucous membranes Cardiovascular: regular rhythm, normal rate, no murmurs. S1 and S2 normal. Radial pulses normal bilaterally. No jugular venous distention.  Respiratory: clear to auscultation bilaterally GI : normal bowel sounds, soft and nontender. No distention.   MSK: extremities warm, well perfused. No edema.  NEURO: grossly nonfocal exam, moves all extremities. PSYCH: alert and oriented x 3, normal mood and affect.      ASSESSMENT:    1. Coronary artery calcification   2. Essential hypertension   3. Hyperlipidemia associated with type 2 diabetes mellitus ([place])   4. Former smoker   5. Controlled type 2 diabetes mellitus without complication, with long-term current use of insulin ([facility])    PLAN:    #1 coronary artery calcifications with dyspnea-the patient I have participated in shared decision making and determined that she would like to wait until  the summer to further investigate her coronary artery calcifications.  Overall she feels well and has no acute concerns.  Her symptoms have not changed.  Continue lovastatin, metoprolol and losartan.  #2 hypertension-continue amlodipine, losartan, metoprolol for blood pressure control.  Blood pressure is 136/70 today, could consider increasing losartan for even better control of blood pressure.  #3 hyperlipidemia-continue lovastatin  TIME SPENT WITH PATIENT: 25 minutes of direct patient care. More than 50% of that time was spent on coordination of care and counseling regarding natural history of coronary artery disease and coronary artery calcifications.  Cherlynn Kaiser, MD [place]  [facility]   Medication Adjustments/Labs and Tests Ordered: Current medicines are reviewed at length with the patient today.  Concerns regarding medicines are outlined above.  No orders of the defined types were placed in this encounter.  No orders of the defined types were placed in this encounter.   Patient Instructions  Medication Instructions:  Your physician recommends that you continue on your current medications as directed. Please refer to the Current Medication list given to you today.  *If you need a refill on your cardiac medications before your next appointment, please call your pharmacy*  Lab Work: NONE  Testing/Procedures: NONE   Follow-Up: At [facility], you and your health needs are our priority.  As part of our continuing mission to provide you with exceptional heart care, we have created designated Provider Care Teams.  These Care Teams include your primary Cardiologist (physician) and Advanced Practice Providers (APPs -  Physician Assistants and Nurse Practitioners) who all work together to provide you with the care you need, when you need it.  Your next appointment:   6 month(s)  You will receive a reminder letter in the mail two months in advance. If you don't receive  a letter, please call our office to schedule the follow-up appointment.   The format for your next appointment:   In Person  Provider:  You may see DR Margaretann Loveless   or one of the following Advanced Practice Providers on your designated Care Team:    Rosaria Ferries, PA-C  Jory Sims, DNP, ANP  Cadence Kathlen Mody, NP

## 2019-02-03 NOTE — Patient Instructions (Addendum)
Medication Instructions:  Your physician recommends that you continue on your current medications as directed. Please refer to the Current Medication list given to you today.  *If you need a refill on your cardiac medications before your next appointment, please call your pharmacy*  Lab Work: NONE  Testing/Procedures: NONE   Follow-Up: At [facility], you and your health needs are our priority.  As part of our continuing mission to provide you with exceptional heart care, we have created designated Provider Care Teams.  These Care Teams include your primary Cardiologist (physician) and Advanced Practice Providers (APPs -  Physician Assistants and Nurse Practitioners) who all work together to provide you with the care you need, when you need it.  Your next appointment:   6 month(s)  You will receive a reminder letter in the mail two months in advance. If you don't receive a letter, please call our office to schedule the follow-up appointment.   The format for your next appointment:   In Person  Provider:   You may see DR Margaretann Loveless   or one of the following Advanced Practice Providers on your designated Care Team:    Rosaria Ferries, PA-C  Jory Sims, DNP, ANP  Cadence Kathlen Mody, NP

## 2019-02-04 ENCOUNTER — Ambulatory Visit (INDEPENDENT_AMBULATORY_CARE_PROVIDER_SITE_OTHER)
Admission: RE | Admit: 2019-02-04 | Discharge: 2019-02-04 | Disposition: A | Discharge status: Home / Self Care | Payer: Medicare HMO | Source: Ambulatory Visit | Attending: Acute Care | Admitting: Acute Care

## 2019-02-04 DIAGNOSIS — Z87891 Personal history of nicotine dependence: Secondary | ICD-10-CM

## 2019-02-04 DIAGNOSIS — Z122 Encounter for screening for malignant neoplasm of respiratory organs: Secondary | ICD-10-CM

## 2019-02-07 ENCOUNTER — Telehealth: Payer: Self-pay | Admitting: Acute Care

## 2019-02-07 DIAGNOSIS — Z87891 Personal history of nicotine dependence: Secondary | ICD-10-CM

## 2019-02-07 NOTE — Telephone Encounter (Signed)
Pt informed of CT results per Sarah Groce, NP.  PT verbalized understanding.  Copy sent to PCP.  Order placed for 1 yr f/u CT.  

## 2019-02-09 DIAGNOSIS — R69 Illness, unspecified: Secondary | ICD-10-CM | POA: Diagnosis not present

## 2019-02-10 ENCOUNTER — Other Ambulatory Visit: Payer: Self-pay

## 2019-02-11 ENCOUNTER — Other Ambulatory Visit: Payer: Self-pay | Admitting: Family Medicine

## 2019-02-11 ENCOUNTER — Encounter: Payer: Self-pay | Admitting: Obstetrics & Gynecology

## 2019-02-11 ENCOUNTER — Ambulatory Visit: Payer: Medicare HMO | Admitting: Obstetrics & Gynecology

## 2019-02-11 VITALS — BP 146/88 | Ht 62.5 in | Wt 170.0 lb

## 2019-02-11 DIAGNOSIS — N9089 Other specified noninflammatory disorders of vulva and perineum: Secondary | ICD-10-CM | POA: Diagnosis not present

## 2019-02-11 DIAGNOSIS — N952 Postmenopausal atrophic vaginitis: Secondary | ICD-10-CM | POA: Diagnosis not present

## 2019-02-11 NOTE — Patient Instructions (Signed)
1. Vulvar lesions Posterior vulva and perianal area with white lesions.  F/U Vulvar colposcopy with vulvar biopsies as soon as possible to rule out VIN and vulvar cancer.  Patient voiced understanding and agreement with plan.  2. Postmenopausal atrophic vaginitis Can use Replens moisturizer as needed.  May also continue with Vaseline.  Jessica Hart, it was a pleasure meeting you today!

## 2019-02-11 NOTE — Progress Notes (Signed)
    Jessica Hart Oct 25, 1940 [id number]        78 y.o.  G1P1L1 Married.  RP: Vulvar irritation x one year  HPI: Vulvar irritation and dryness x one year.  Irritated vulvar skin easy to bleed when irritated and scratching, but very little.  Applying vaseline to sooth the area.  H/O genital herpes.  Postmenopause, well on no HRT.  No PMB otherwise.  Mild SUI with coughing or sneezing.  BMs normal.  DM type 2.     OB History  Gravida Para Term Preterm AB Living  _0 SAB TAB Ectopic Multiple Live Births               # Outcome Date GA Lbr Len/2nd Weight Sex Delivery Anes PTL Lv  1 Para             Past medical history,surgical history, problem list, medications, allergies, family history and social history were all reviewed and documented in the EPIC chart.   Directed ROS with pertinent positives and negatives documented in the history of present illness/assessment and plan.  Exam:  Vitals:   02/11/19 1328  BP: (!) 146/88  Weight: 170 lb (77.1 kg)  Height: 5' 2.5" (1.588 m)   General appearance:  Normal  Gynecologic exam: Vulva with vitiligo and atrophy.  Posterior vulva and perianal area with white lesions, slightly elevated.   Assessment/Plan:  78 y.o. G1P1   1. Vulvar lesions Posterior vulva and perianal area with white lesions.  F/U Vulvar colposcopy with vulvar biopsies as soon as possible to rule out VIN and vulvar cancer.  Patient voiced understanding and agreement with plan.  2. Postmenopausal atrophic vaginitis Can use Replens moisturizer as needed.  May also continue with Vaseline.  Counseling on above issues and coordination of care more than 50% for 30 minutes.  Princess Bruins MD, 1:49 PM 02/11/2019

## 2019-02-14 ENCOUNTER — Encounter: Payer: Self-pay | Admitting: Obstetrics & Gynecology

## 2019-02-14 ENCOUNTER — Ambulatory Visit: Payer: Medicare HMO | Admitting: Obstetrics & Gynecology

## 2019-02-14 ENCOUNTER — Other Ambulatory Visit: Payer: Self-pay

## 2019-02-14 VITALS — BP 136/88

## 2019-02-14 DIAGNOSIS — L280 Lichen simplex chronicus: Secondary | ICD-10-CM | POA: Diagnosis not present

## 2019-02-14 DIAGNOSIS — Z1272 Encounter for screening for malignant neoplasm of vagina: Secondary | ICD-10-CM | POA: Diagnosis not present

## 2019-02-14 DIAGNOSIS — N9089 Other specified noninflammatory disorders of vulva and perineum: Secondary | ICD-10-CM

## 2019-02-14 DIAGNOSIS — N904 Leukoplakia of vulva: Secondary | ICD-10-CM | POA: Diagnosis not present

## 2019-02-14 DIAGNOSIS — N763 Subacute and chronic vulvitis: Secondary | ICD-10-CM | POA: Diagnosis not present

## 2019-02-14 NOTE — Patient Instructions (Signed)
1. Vulvar lesion Pap test of the vaginal vault done given status post total hysterectomy for "abnormal cells".  Vulvar colposcopy explained and findings reviewed with patient.  Acetowhite vulvar lesions that are slightly raised.  Rule out VIN and vulvar cancer.  Background of vitiligo and lichen sclerosis.  Management per vulvar biopsy results.  Postprocedure precautions reviewed with patient.  Will apply cold compresses with or without ice if experiencing severe pain in the first 24 hours.  Will then use warm compresses or warm baths to keep the site of biopsy clean and favor better circulation and healing.  Jessica Hart, it was a pleasure seeing you today!  I will inform you of your results as soon as they are available.

## 2019-02-14 NOTE — Progress Notes (Signed)
    Jessica Hart [birth year] [id number]        78 y.o.  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  H/O Total Hysterectomy in her 89's for "Abnormal Cells" per patient.   OB History  Gravida Para Term Preterm AB Living  _0 SAB TAB Ectopic Multiple Live Births               # Outcome Date GA Lbr Len/2nd Weight Sex Delivery Anes PTL Lv  1 Para             Past medical history,surgical history, problem list, medications, allergies, family history and social history were all reviewed and documented in the EPIC chart.   Directed ROS with pertinent positives and negatives documented in the history of present illness/assessment and plan.  Exam:  Vitals:   02/14/19 1205  BP: 136/88   General appearance:  Normal  Gynecologic exam: Vulva with Vitiligo/Lichen Sclerosus and white lesions especially at the posterior vulva.    Colposcopy Procedure Note Jessica Hart 02/14/2019  Indications:  White vulvar/perianal lesions  Procedure Details  The risks and benefits of the procedure and Verbal informed consent obtained.  Vulva swabbed x 3 with acetic acid solution.  Findings:  Cervix absent  Vaginal:  Normal  Vulvar and Perirectal colposcopy: Physical Exam Genitourinary:      The cervix was sprayed with [place] before performing the cervical biopsies.  Specimens: Right Posterior Vulvar/Perianal biopsy  Complications:  None.  Good hemostasis with 3 separate stitches of Vicryl 4-0. Marland Kitchen Plan:  Management per results    Assessment/Plan:  78 y.o. G1P1   1. Vulvar lesion Pap test of the vaginal vault done given status post total hysterectomy for "abnormal cells".  Vulvar colposcopy explained and findings reviewed with patient.  Acetowhite vulvar lesions that are slightly raised.  Rule out VIN and vulvar cancer.   Background of vitiligo and lichen sclerosis.  Management per vulvar biopsy results.  Postprocedure precautions reviewed with patient.  Will apply cold compresses with or without ice if experiencing severe pain in the first 24 hours.  Will then use warm compresses or warm baths to keep the site of biopsy clean and favor better circulation and healing.  Counseling on above issues and coordination of care more than 50% for 10 minutes.  Princess Bruins MD, 12:06 PM 02/14/2019

## 2019-02-18 LAB — PAP IG W/ RFLX HPV ASCU

## 2019-02-18 LAB — TISSUE SPECIMEN

## 2019-02-18 LAB — PATHOLOGY REPORT

## 2019-02-25 ENCOUNTER — Other Ambulatory Visit: Payer: Self-pay

## 2019-02-25 MED ORDER — CLOBETASOL PROP EMOLLIENT BASE 0.05 % EX CREA: TOPICAL_CREAM | CUTANEOUS | 3 refills | Status: DC

## 2019-03-10 ENCOUNTER — Other Ambulatory Visit: Payer: Self-pay | Admitting: Family Medicine

## 2019-03-13 ENCOUNTER — Ambulatory Visit: Payer: Medicare HMO | Admitting: Family Medicine

## 2019-03-13 ENCOUNTER — Other Ambulatory Visit: Payer: Self-pay | Admitting: Family Medicine

## 2019-03-13 DIAGNOSIS — I10 Essential (primary) hypertension: Secondary | ICD-10-CM

## 2019-03-16 DIAGNOSIS — R69 Illness, unspecified: Secondary | ICD-10-CM | POA: Diagnosis not present

## 2019-03-27 ENCOUNTER — Ambulatory Visit
Admission: RE | Admit: 2019-03-27 | Discharge: 2019-03-27 | Disposition: A | Discharge status: Home / Self Care | Payer: Medicare HMO | Source: Ambulatory Visit | Attending: Family Medicine | Admitting: Family Medicine

## 2019-03-27 ENCOUNTER — Other Ambulatory Visit: Payer: Self-pay

## 2019-03-27 DIAGNOSIS — Z1231 Encounter for screening mammogram for malignant neoplasm of breast: Secondary | ICD-10-CM

## 2019-03-31 ENCOUNTER — Other Ambulatory Visit: Payer: Self-pay | Admitting: Family Medicine

## 2019-04-29 DIAGNOSIS — E119 Type 2 diabetes mellitus without complications: Secondary | ICD-10-CM | POA: Diagnosis not present

## 2019-04-29 DIAGNOSIS — Z794 Long term (current) use of insulin: Secondary | ICD-10-CM | POA: Diagnosis not present

## 2019-04-29 DIAGNOSIS — Z961 Presence of intraocular lens: Secondary | ICD-10-CM | POA: Diagnosis not present

## 2019-04-29 DIAGNOSIS — H524 Presbyopia: Secondary | ICD-10-CM | POA: Diagnosis not present

## 2019-04-29 LAB — HM DIABETES EYE EXAM

## 2019-05-01 ENCOUNTER — Telehealth: Payer: Self-pay | Admitting: Family Medicine

## 2019-05-01 NOTE — Telephone Encounter (Signed)
Pt called for samples of [place] and Farxiga. Please call pt [phone] when ready.

## 2019-05-01 NOTE — Telephone Encounter (Signed)
Waiting on samples of farxiga

## 2019-05-02 ENCOUNTER — Telehealth: Payer: Self-pay | Admitting: Family Medicine

## 2019-05-02 ENCOUNTER — Other Ambulatory Visit: Payer: Self-pay | Admitting: Internal Medicine

## 2019-05-02 DIAGNOSIS — K219 Gastro-esophageal reflux disease without esophagitis: Secondary | ICD-10-CM

## 2019-05-02 MED ORDER — OMEPRAZOLE 40 MG PO CPDR: 40.0000 mg | DELAYED_RELEASE_CAPSULE | Freq: Every day | ORAL | 0 refills | Status: DC

## 2019-05-02 NOTE — Telephone Encounter (Signed)
Have 1 sample of farixga 28m and have 3 pens of tresiba in refrgi for her

## 2019-05-02 NOTE — Telephone Encounter (Signed)
Pt called to check on samples and she needs refills of omeprazole sent to [place]. Please send to [address].

## 2019-05-05 ENCOUNTER — Other Ambulatory Visit: Payer: Self-pay | Admitting: Family Medicine

## 2019-05-06 ENCOUNTER — Other Ambulatory Visit: Payer: Self-pay | Admitting: Family Medicine

## 2019-05-12 DIAGNOSIS — R69 Illness, unspecified: Secondary | ICD-10-CM | POA: Diagnosis not present

## 2019-05-20 ENCOUNTER — Telehealth: Payer: Self-pay | Admitting: Internal Medicine

## 2019-05-20 DIAGNOSIS — E1165 Type 2 diabetes mellitus with hyperglycemia: Secondary | ICD-10-CM

## 2019-05-20 MED ORDER — FARXIGA 5 MG PO TABS: 5.0000 mg | ORAL_TABLET | Freq: Every day | ORAL | 2 refills | Status: DC

## 2019-05-20 NOTE — Telephone Encounter (Signed)
Pt called for samples of farixga. We do not have any at this time. I have sent in a rx to pharmacy and pt will let me know if its too expensive. If it is, pt has pills left over of tranjenta and wants to know if she can take those

## 2019-05-20 NOTE — Telephone Encounter (Signed)
Hopefully the [place] will be affordable. Maybe we could try to get samples? If not, I am ok with her switching back to the [place]. The key is going to be how her blood sugars are looking. Ask her to keep [place] posted please.

## 2019-05-21 NOTE — Telephone Encounter (Signed)
Pt states the farixga was affordable and will call before her 30 days runs out to get a 90 day supply instead.

## 2019-05-28 ENCOUNTER — Other Ambulatory Visit: Payer: Self-pay | Admitting: Family Medicine

## 2019-05-28 DIAGNOSIS — K219 Gastro-esophageal reflux disease without esophagitis: Secondary | ICD-10-CM

## 2019-05-31 ENCOUNTER — Other Ambulatory Visit: Payer: Self-pay | Admitting: Family Medicine

## 2019-06-13 ENCOUNTER — Telehealth: Payer: Self-pay | Admitting: Internal Medicine

## 2019-06-13 DIAGNOSIS — E1165 Type 2 diabetes mellitus with hyperglycemia: Secondary | ICD-10-CM

## 2019-06-13 MED ORDER — FARXIGA 5 MG PO TABS: 5.0000 mg | ORAL_TABLET | Freq: Every day | ORAL | 0 refills | Status: DC

## 2019-06-13 NOTE — Telephone Encounter (Signed)
Pt needed a 90 day supply

## 2019-06-14 ENCOUNTER — Other Ambulatory Visit: Payer: Self-pay | Admitting: Family Medicine

## 2019-06-17 DIAGNOSIS — R69 Illness, unspecified: Secondary | ICD-10-CM | POA: Diagnosis not present

## 2019-07-29 ENCOUNTER — Encounter: Payer: Self-pay | Admitting: Internal Medicine

## 2019-07-29 ENCOUNTER — Ambulatory Visit: Payer: Medicare HMO | Admitting: Internal Medicine

## 2019-07-29 ENCOUNTER — Other Ambulatory Visit: Payer: Self-pay

## 2019-07-29 VITALS — BP 150/78 | HR 76 | Ht 63.0 in | Wt 175.0 lb

## 2019-07-29 DIAGNOSIS — I10 Essential (primary) hypertension: Secondary | ICD-10-CM | POA: Diagnosis not present

## 2019-07-29 DIAGNOSIS — I2584 Coronary atherosclerosis due to calcified coronary lesion: Secondary | ICD-10-CM | POA: Diagnosis not present

## 2019-07-29 DIAGNOSIS — Z794 Long term (current) use of insulin: Secondary | ICD-10-CM

## 2019-07-29 DIAGNOSIS — E1169 Type 2 diabetes mellitus with other specified complication: Secondary | ICD-10-CM

## 2019-07-29 DIAGNOSIS — R072 Precordial pain: Secondary | ICD-10-CM

## 2019-07-29 DIAGNOSIS — E119 Type 2 diabetes mellitus without complications: Secondary | ICD-10-CM

## 2019-07-29 DIAGNOSIS — E785 Hyperlipidemia, unspecified: Secondary | ICD-10-CM | POA: Diagnosis not present

## 2019-07-29 DIAGNOSIS — I2510 Atherosclerotic heart disease of native coronary artery without angina pectoris: Secondary | ICD-10-CM | POA: Diagnosis not present

## 2019-07-29 DIAGNOSIS — Z87891 Personal history of nicotine dependence: Secondary | ICD-10-CM

## 2019-07-29 NOTE — Patient Instructions (Signed)
Medication Instructions:  Your physician recommends that you continue on your current medications as directed. Please refer to the Current Medication list given to you today.  *If you need a refill on your cardiac medications before your next appointment, please call your pharmacy*  Testing/Procedures: Your physician has requested that you have a lexiscan myoview. For further information please visit [email or web address]. Please follow instruction sheet, as given. ---Monday 6/7 if possible at [place]: At [facility], you and your health needs are our priority.  As part of our continuing mission to provide you with exceptional heart care, we have created designated Provider Care Teams.  These Care Teams include your primary Cardiologist (physician) and Advanced Practice Providers (APPs -  Physician Assistants and Nurse Practitioners) who all work together to provide you with the care you need, when you need it.  We recommend signing up for the patient portal called "MyChart".  Sign up information is provided on this After Visit Summary.  MyChart is used to connect with patients for Virtual Visits (Telemedicine).  Patients are able to view lab/test results, encounter notes, upcoming appointments, etc.  Non-urgent messages can be sent to your provider as well.   To learn more about what you can do with MyChart, go to [email or web address].    Your next appointment:    after testing with Dr. Margaretann Loveless (6/8, 6/10, or 6/11)

## 2019-07-29 NOTE — Progress Notes (Signed)
Cardiology Office Note:    Date:  07/29/2019   ID:  ENNIS DELPOZO, DOB June 23, 1940, MRN [id number]  PCP:  Girtha Rm, NP-C  Cardiologist:  No primary care provider on file.  Electrophysiologist:  None   Referring MD: Girtha Rm, NP-C   Chief Complaint: f/u coronary artery calcifications  History of Present Illness:    Jessica Hart is a 79 y.o. female with a history of diabetes mellitus, hypertension, hyperlipidemia, and coronary artery calcifications seen on a lung cancer screening CT.  She has a 45-pack-year smoking history.  She had described mild shortness of breath with exertion, shopping and walking in the past and with the symptoms we have attempted to obtain an ischemic evaluation.  A CT coronary angiogram was attempted, however heart rate was unable to be optimized and the study was canceled.  She has a history of not tolerating pharmacologic nuclear stress, and she described this as "severe dyspnea" therefore pharmacologic Myoview was not performed.  She returns for follow-up and was last seen February 03, 2019 in my clinic.  She tells me that she has been less active since the pandemic, but now notices resting chest pain.  She notes that her left arm feels cold.  She will have a heaviness on her chest on the left side that will last 10 to 15 minutes at least 1-2 times a week.  Does not seem significantly worsened by exertion.  If she takes a very big deep breath in sometimes this will help slowly ease the pain.  She denies current dyspnea on exertion, palpitations, lightheadedness, syncope.  Denies frequent snoring.  Past Medical History:  Diagnosis Date  . Arthritis   . Cataract bilaterl 3 years ago  . Diabetes mellitus 02/27/2005  . Diabetes mellitus type 2, controlled, without complications ([place]) 7/37/1062   Qualifier: Diagnosis of  By: Drucie Ip    . Diverticulosis   . Echocardiogram findings abnormal, without diagnosis 2005   EF 47%, no ischemia,  no infarction  . Former smoker 01/10/2017   45 pack year history, quit in 2007   . GERD (gastroesophageal reflux disease)   . History of bone density study 2006   Lt hip = -1.0, L spine = -1.8   . Hyperlipidemia   . Hypertension     Past Surgical History:  Procedure Laterality Date  . ABDOMINAL HYSTERECTOMY  1989   one ovary remains per patient  . TONSILECTOMY, ADENOIDECTOMY, BILATERAL MYRINGOTOMY AND TUBES  1965    Current Medications: Current Meds  Medication Sig  . acetaminophen (TYLENOL) 500 MG tablet Take 2 tablets (1,000 mg total) by mouth every 8 (eight) hours as needed. (Patient taking differently: Take 1,000 mg by mouth 2 (two) times daily after a meal. )  . amLODipine (NORVASC) 10 MG tablet TAKE 1 TABLET BY MOUTH EVERY DAY  . aspirin EC 81 MG tablet Take 81 mg by mouth daily.  . Clobetasol Prop Emollient Base (CLOBETASOL PROPIONATE E) 0.05 % emollient cream Apply topically twice daily for the long term.  . dapagliflozin propanediol (FARXIGA) 5 MG TABS tablet Take 5 mg by mouth daily.  . insulin degludec (TRESIBA FLEXTOUCH) 100 UNIT/ML SOPN FlexTouch Pen Inject 0.1 mLs (10 Units total) into the skin daily after supper. (Patient taking differently: Inject 5 Units into the skin daily after supper. )  . Insulin Pen Needle (PEN NEEDLES 5/16") 30G X 8 MM MISC Pen needles used for lantus injections  . Lancets (ONETOUCH DELICA PLUS [id number]) [place]  1 APPLICATION BY OTHER ROUTE DAILY. TEST BLOOD SUGAR ONCE A DAY  . lipase/protease/amylase (CREON) 36000 UNITS CPEP capsule Take 2 capsules with each meal three times daily  Take 1 capsule with each snack twice daily  . losartan (COZAAR) 50 MG tablet TAKE 1 TABLET BY MOUTH EVERY DAY  . lovastatin (MEVACOR) 20 MG tablet TAKE ONE TABLET BY MOUTH ONCE DAILY  . metFORMIN (GLUCOPHAGE) 1000 MG tablet TAKE 1 TABLET BY MOUTH TWICE DAILY WITH A MEAL  . metoprolol tartrate (LOPRESSOR) 100 MG tablet TAKE 1 TABLET BY MOUTH TWICE A DAY  . omeprazole  (PRILOSEC) 40 MG capsule TAKE 1 CAPSULE BY MOUTH EVERY DAY  . ONETOUCH VERIO test strip USE 1 STRIP TO CHECK GLUCOSE ONCE DAILY  . tobramycin (TOBREX) 0.3 % ophthalmic solution Place 1 drop into the left eye every morning. Prn for dry eyes     Allergies:   Lisinopril   Social History   Socioeconomic History  . Marital status: Married    Spouse name: Richard  . Number of children: 1  . Years of education: 69  . Highest education level: Not on file  Occupational History  . Occupation: Retire-Textile    Employer: RETIRED  Tobacco Use  . Smoking status: Former Smoker    Packs/day: 1.00    Years: 45.00    Pack years: 45.00    Quit date: 07/27/2005    Years since quitting: 14.0  . Smokeless tobacco: Former Systems developer    Quit date: 03/2005  Substance and Sexual Activity  . Alcohol use: Yes    Comment: beer occasionally   . Drug use: No  . Sexual activity: Not Currently    Partners: Male    Comment: 1st intercourse-22, partners- 34, married- 34 yrs   Other Topics Concern  . Not on file  Social History Narrative   Health Care POA:    Emergency Contact: husband, Jessica Hart   End of Life Plan:    Who lives with you: Lives with husband   Any pets: gold fish   Diet: Patient has a varied diet and reports stuggeling with portion size and ice cream.   Exercise: Patient does not have currently exercise routine.   Seatbelts: Patient reports wearing seatbelt when in vehicle.   Sun Exposure/Protection: Patient reports not using sun protection.   Hobbies: Bingo         Social Determinants of Health   Financial Resource Strain:   . Difficulty of Paying Living Expenses:   Food Insecurity:   . Worried About Charity fundraiser in the Last Year:   . Arboriculturist in the Last Year:   Transportation Needs:   . Film/video editor (Medical):   Marland Kitchen Lack of Transportation (Non-Medical):   Physical Activity:   . Days of Exercise per Week:   . Minutes of Exercise per Session:   Stress:    . Feeling of Stress :   Social Connections:   . Frequency of Communication with Friends and Family:   . Frequency of Social Gatherings with Friends and Family:   . Attends Religious Services:   . Active Member of Clubs or Organizations:   . Attends Archivist Meetings:   Marland Kitchen Marital Status:      Family History: The patient's family history includes Cancer in her sister; Heart disease in her father; Tongue cancer in her sister. There is no history of Colon polyps, Rectal cancer, or Stomach cancer.  ROS:   Please  see the history of present illness.    All other systems reviewed and are negative.  EKGs/Labs/Other Studies Reviewed:    The following studies were reviewed today:  EKG:  NSR, LVH repol abnormality, inferior infarct pattern   Recent Labs: 01/29/2019: ALT 11; BUN 9; Creatinine, Ser 0.71; Hemoglobin 15.6; Platelets 339; Potassium 3.8; Sodium 141; TSH 0.923  Recent Lipid Panel    Component Value Date/Time   CHOL 153 01/29/2019 1057   TRIG 216 (H) 01/29/2019 1057   HDL 38 (L) 01/29/2019 1057   CHOLHDL 4.0 01/29/2019 1057   CHOLHDL 4.4 08/29/2016 0920   VLDL 28 08/29/2016 0920   LDLCALC 79 01/29/2019 1057   LDLDIRECT 81 03/21/2013 1431    Physical Exam:    VS:  BP (!) 150/78   Pulse 76   Ht _0  (1.6 m)   Wt 175 lb (79.4 kg)   BMI 31.00 kg/m     Wt Readings from Last 5 Encounters:  07/29/19 175 lb (79.4 kg)  02/11/19 170 lb (77.1 kg)  02/03/19 171 lb (77.6 kg)  01/29/19 171 lb 9.6 oz (77.8 kg)  12/30/18 174 lb 3.2 oz (79 kg)     Constitutional: No acute distress Eyes: sclera non-icteric, normal conjunctiva and lids ENMT: normal dentition, moist mucous membranes Cardiovascular: regular rhythm, normal rate, no murmurs. S1 and S2 normal. Radial pulses normal bilaterally. No jugular venous distention.  Respiratory: clear to auscultation bilaterally GI : normal bowel sounds, soft and nontender. No distention.   MSK: extremities warm, well  perfused. No edema.  NEURO: grossly nonfocal exam, moves all extremities. PSYCH: alert and oriented x 3, normal mood and affect.   ASSESSMENT:    1. Coronary artery calcification   2. Precordial pain   3. Essential hypertension   4. Hyperlipidemia associated with type 2 diabetes mellitus ([place])   5. Former smoker   6. Controlled type 2 diabetes mellitus without complication, with long-term current use of insulin (HCC)    PLAN:    Coronary artery calcification - Plan: EKG 12-Lead, MYOCARDIAL PERFUSION IMAGING Precordial pain - Plan: EKG 12-Lead, MYOCARDIAL PERFUSION IMAGING  She is describing resting chest pain, and with risk factors of diabetes, hypertension, hyperlipidemia, and prior significant smoking history with demonstrated coronary artery calcifications, we will need to exclude ischemia.  We reviewed stress testing options including CT coronary angiogram and stress Myoview.  I have offered the patient to proceed directly to invasive coronary angiography given her resting symptoms.  She has a family member who has required dialysis after contrast studies and she is quite nervous to receive iodinated contrast if not required.  We discussed that both invasive coronary angiography as well as CT coronary angiogram will have iodinated contrast, therefore if she would like to avoid this and still have an ischemic evaluation we should perform pharmacologic Myoview.  We discussed in detail her symptoms at the last study.  She describes typical pharmacologic stress symptoms and does not describe any respiratory decompensation or reactive airway disease.  We participated in shared decision making and determined it is probably safe to proceed with pharmacologic stress Myoview.  I will see her in follow-up afterward.  Essential hypertension -continue amlodipine, losartan, metoprolol tartrate.  Will titrate therapy further after stress testing is complete to better guide management.  Hyperlipidemia  associated with type 2 diabetes mellitus (HCC)-continue lovastatin 20 mg a day.  Total time of encounter: 30 minutes total time of encounter, including 20 minutes spent in face-to-face patient care on  the date of this encounter. This time includes coordination of care and counseling regarding above mentioned problem list. Remainder of non-face-to-face time involved reviewing chart documents/testing relevant to the patient encounter and documentation in the medical record. I have independently reviewed documentation from referring provider.   Cherlynn Kaiser, MD [place]  [facility]    Medication Adjustments/Labs and Tests Ordered: Current medicines are reviewed at length with the patient today.  Concerns regarding medicines are outlined above.  Orders Placed This Encounter  Procedures  . MYOCARDIAL PERFUSION IMAGING  . EKG 12-Lead   No orders of the defined types were placed in this encounter.   Patient Instructions  Medication Instructions:  Your physician recommends that you continue on your current medications as directed. Please refer to the Current Medication list given to you today.  *If you need a refill on your cardiac medications before your next appointment, please call your pharmacy*  Testing/Procedures: Your physician has requested that you have a lexiscan myoview. For further information please visit [email or web address]. Please follow instruction sheet, as given. ---Monday 6/7 if possible at [place]: At [facility], you and your health needs are our priority.  As part of our continuing mission to provide you with exceptional heart care, we have created designated Provider Care Teams.  These Care Teams include your primary Cardiologist (physician) and Advanced Practice Providers (APPs -  Physician Assistants and Nurse Practitioners) who all work together to provide you with the care you need, when you need it.  We recommend signing up for the  patient portal called "MyChart".  Sign up information is provided on this After Visit Summary.  MyChart is used to connect with patients for Virtual Visits (Telemedicine).  Patients are able to view lab/test results, encounter notes, upcoming appointments, etc.  Non-urgent messages can be sent to your provider as well.   To learn more about what you can do with MyChart, go to [email or web address].    Your next appointment:    after testing with Dr. Margaretann Loveless (6/8, 6/10, or 6/11)

## 2019-07-30 ENCOUNTER — Telehealth (HOSPITAL_COMMUNITY): Payer: Self-pay | Admitting: *Deleted

## 2019-07-30 NOTE — Telephone Encounter (Signed)
Patient given detailed instructions per Myocardial Perfusion Study Information Sheet for the test on 08/06/19 . Patient notified to arrive 15 minutes early and that it is imperative to arrive on time for appointment to keep from having the test rescheduled.  If you need to cancel or reschedule your appointment, please call the office within 24 hours of your appointment. . Patient verbalized understanding. Kirstie Peri

## 2019-07-31 DIAGNOSIS — S0502XA Injury of conjunctiva and corneal abrasion without foreign body, left eye, initial encounter: Secondary | ICD-10-CM | POA: Diagnosis not present

## 2019-08-01 DIAGNOSIS — S0502XD Injury of conjunctiva and corneal abrasion without foreign body, left eye, subsequent encounter: Secondary | ICD-10-CM | POA: Diagnosis not present

## 2019-08-06 ENCOUNTER — Other Ambulatory Visit: Payer: Self-pay

## 2019-08-06 ENCOUNTER — Ambulatory Visit (HOSPITAL_COMMUNITY): Payer: Medicare HMO | Attending: Cardiovascular Disease

## 2019-08-06 DIAGNOSIS — I2584 Coronary atherosclerosis due to calcified coronary lesion: Secondary | ICD-10-CM | POA: Insufficient documentation

## 2019-08-06 DIAGNOSIS — R072 Precordial pain: Secondary | ICD-10-CM | POA: Diagnosis not present

## 2019-08-06 DIAGNOSIS — I2510 Atherosclerotic heart disease of native coronary artery without angina pectoris: Secondary | ICD-10-CM | POA: Insufficient documentation

## 2019-08-06 LAB — MYOCARDIAL PERFUSION IMAGING
LV dias vol: 116 mL (ref 46–106)
LV sys vol: 74 mL
Peak HR: 103 {beats}/min
Rest HR: 86 {beats}/min
SDS: 6
SRS: 15
SSS: 23
TID: 1.08

## 2019-08-06 MED ORDER — TECHNETIUM TC 99M TETROFOSMIN IV KIT
10.6000 | PACK | Freq: Once | INTRAVENOUS | Status: AC | PRN
  Administered 2019-08-06: 10.6 via INTRAVENOUS
  Filled 2019-08-06: qty 11

## 2019-08-06 MED ORDER — TECHNETIUM TC 99M TETROFOSMIN IV KIT
32.2000 | PACK | Freq: Once | INTRAVENOUS | Status: AC | PRN
  Administered 2019-08-06: 32.2 via INTRAVENOUS
  Filled 2019-08-06: qty 33

## 2019-08-06 MED ORDER — REGADENOSON 0.4 MG/5ML IV SOLN
0.4000 mg | Freq: Once | INTRAVENOUS | Status: AC
  Administered 2019-08-06: 0.4 mg via INTRAVENOUS

## 2019-08-07 ENCOUNTER — Ambulatory Visit: Payer: Medicare HMO | Admitting: Internal Medicine

## 2019-08-07 ENCOUNTER — Encounter: Payer: Self-pay | Admitting: Internal Medicine

## 2019-08-07 VITALS — BP 122/70 | HR 80 | Ht 63.0 in | Wt 171.8 lb

## 2019-08-07 DIAGNOSIS — R9439 Abnormal result of other cardiovascular function study: Secondary | ICD-10-CM | POA: Diagnosis not present

## 2019-08-07 NOTE — Patient Instructions (Signed)
Medication Instructions:  START TAKING ASPIRIN 81 MG DAILY  If you need a refill on your cardiac medications before your next appointment, please call your pharmacy*   Lab Work: CBC BMP  If you have labs (blood work) drawn today and your tests are completely normal, you will receive your results only by: Marland Kitchen MyChart Message (if you have MyChart) OR . A paper copy in the mail If you have any lab test that is abnormal or we need to change your treatment, we will call you to review the results.   Testing/Procedures:  WILL BE SCHEDULE AT [facility] Your physician has requested that you have a cardiac catheterization. Cardiac catheterization is used to diagnose and/or treat various heart conditions. Doctors may recommend this procedure for a number of different reasons. The most common reason is to evaluate chest pain. Chest pain can be a symptom of coronary artery disease (CAD), and cardiac catheterization can show whether plaque is narrowing or blocking your heart's arteries. This procedure is also used to evaluate the valves, as well as measure the blood flow and oxygen levels in different parts of your heart. For further information please visit [email or web address]. Please follow instruction sheet, as given.   Follow-Up: At [facility], you and your health needs are our priority.  As part of our continuing mission to provide you with exceptional heart care, we have created designated Provider Care Teams.  These Care Teams include your primary Cardiologist (physician) and Advanced Practice Providers (APPs -  Physician Assistants and Nurse Practitioners) who all work together to provide you with the care you need, when you need it.  We recommend signing up for the patient portal called "MyChart".  Sign up information is provided on this After Visit Summary.  MyChart is used to connect with patients for Virtual Visits (Telemedicine).  Patients are able to view lab/test results, encounter  notes, upcoming appointments, etc.  Non-urgent messages can be sent to your provider as well.   To learn more about what you can do with MyChart, go to [email or web address].    Your next appointment:   4 week(s)  The format for your next appointment:   In Person  Provider:   Cherlynn Kaiser, MD   Other Instructions       [address] [place] [address] Dept: [phone] Loc: [place]  08/07/2019  You are scheduled for a Cardiac Catheterization on Wednesday, June 23 with Dr. Daneen Schick.  1. Please arrive at the [facility] (Main Entrance A) at [facility]: [address] [place], [place] [address] at 6:30 AM (This time is two hours before your procedure to ensure your preparation). Free valet parking service is available.   Special note: Every effort is made to have your procedure done on time. Please understand that emergencies sometimes delay scheduled procedures.  2. Diet: Do not eat solid foods after midnight.  The patient may have clear liquids until 5am upon the day of the procedure.  3. Labs: You will need to have blood drawn on  CBC, BMP , June 10 at [place]  Open: 8am - 5pm (Lunch 12:30 - 1:30)   Phone: [phone]. You do not need to be fasting.  4. Medication instructions in preparation for your procedure:   Contrast Allergy: No  Stop taking, Avapro (Irbesartan), Cozaar (Losartan) Wednesday, June 23,  Take only 5 units  of insulin the night before your procedure. Do not take any insulin on the day of the procedure.  Do not take Diabetes Med Glucophage (Metformin) on the day of the procedure and HOLD 48 HOURS AFTER THE PROCEDURE.  On the morning of your procedure, take your Aspirin 81  MG and any morning medicines NOT listed above.  You may use sips of water.  5. Plan for one  night stay--bring personal belongings. 6. Bring a current list of your medications and current insurance cards. 7. You MUST have a responsible person to drive you home. 8. Someone MUST be with you the first 24 hours after you arrive home or your discharge will be delayed. 9. Please wear clothes that are easy to get on and off and wear slip-on shoes.  Thank you for allowing [place] to care for you!   -- [facility]

## 2019-08-08 LAB — BASIC METABOLIC PANEL
BUN/Creatinine Ratio: 13 (ref 12–28)
BUN: 13 mg/dL (ref 8–27)
CO2: 20 mmol/L (ref 20–29)
Calcium: 10 mg/dL (ref 8.7–10.3)
Chloride: 103 mmol/L (ref 96–106)
Creatinine, Ser: 0.98 mg/dL (ref 0.57–1.00)
GFR calc Af Amer: 64 mL/min/{1.73_m2} (ref >59)
GFR calc non Af Amer: 56 mL/min/{1.73_m2} — ABNORMAL LOW (ref >59)
Glucose: 121 mg/dL — ABNORMAL HIGH (ref 65–99)
Potassium: 4.9 mmol/L (ref 3.5–5.2)
Sodium: 141 mmol/L (ref 134–144)

## 2019-08-08 LAB — CBC
Hematocrit: 47.3 % — ABNORMAL HIGH (ref 34.0–46.6)
Hemoglobin: 15.6 g/dL (ref 11.1–15.9)
MCH: 29.7 pg (ref 26.6–33.0)
MCHC: 33 g/dL (ref 31.5–35.7)
MCV: 90 fL (ref 79–97)
Platelets: 323 10*3/uL (ref 150–450)
RBC: 5.26 x10E6/uL (ref 3.77–5.28)
RDW: 13.6 % (ref 11.7–15.4)
WBC: 7.1 10*3/uL (ref 3.4–10.8)

## 2019-08-11 ENCOUNTER — Other Ambulatory Visit: Payer: Self-pay | Admitting: *Deleted

## 2019-08-11 DIAGNOSIS — R9439 Abnormal result of other cardiovascular function study: Secondary | ICD-10-CM

## 2019-08-11 DIAGNOSIS — I2510 Atherosclerotic heart disease of native coronary artery without angina pectoris: Secondary | ICD-10-CM

## 2019-08-11 NOTE — Progress Notes (Signed)
Order placed for left heart cath

## 2019-08-19 ENCOUNTER — Telehealth: Payer: Self-pay | Admitting: *Deleted

## 2019-08-19 NOTE — Progress Notes (Signed)
Cardiology Office Note:    Date:  08/07/2019   ID:  NAMI STRAWDER, DOB [birth year], MRN [id number]  PCP:  Girtha Rm, NP-C  Cardiologist:  No primary care provider on file.  Electrophysiologist:  None   Referring MD: Girtha Rm, NP-C   Chief Complaint: f/u chest pain and test results.  History of Present Illness:    Jessica Hart is a 79 y.o. female with a history of diabetes mellitus, hypertension, hyperlipidemia, and coronary artery calcifications seen on a lung cancer screening CT.  She has a 45-pack-year smoking history.   We discussed results of stress testing obtained for chest pain. Stress nuclear study showed:  Nuclear stress EF: 36%.  There was no ST segment deviation noted during stress.  Defect 1: There is a small defect of mild severity present in the apical anterior and apex location.  Defect 2: There is a large defect of severe severity present in the basal inferior, basal inferolateral, mid inferior, mid inferolateral, apical inferior and apical lateral location.  This is an intermediate risk study.  The left ventricular ejection fraction is moderately decreased (30-44%).  There is a lot of extracardiac activity adjacent to the inferior and inferolateral myocardium. This makes the study more difficult to interpret and can cause artifact. Wall motion is abnormal in these regions as well, raising the possibility of prior infarct. There is no ischemia.  We discussed these results in detail in the setting of chest pain, DOE and known coronary artery calcifications. She continues to have chest pain at rest.   Past Medical History:  Diagnosis Date  . Arthritis   . Cataract bilaterl 3 years ago  . Diabetes mellitus 02/27/2005  . Diabetes mellitus type 2, controlled, without complications ([place]) 3/00/9233   Qualifier: Diagnosis of  By: Drucie Ip    . Diverticulosis   . Echocardiogram findings abnormal, without diagnosis 2005   EF 47%, no  ischemia, no infarction  . Former smoker 01/10/2017   45 pack year history, quit in 2007   . GERD (gastroesophageal reflux disease)   . History of bone density study 2006   Lt hip = -1.0, L spine = -1.8   . Hyperlipidemia   . Hypertension     Past Surgical History:  Procedure Laterality Date  . ABDOMINAL HYSTERECTOMY  1989   one ovary remains per patient  . TONSILECTOMY, ADENOIDECTOMY, BILATERAL MYRINGOTOMY AND TUBES  1965    Current Medications: Current Meds  Medication Sig  . acetaminophen (TYLENOL) 500 MG tablet Take 2 tablets (1,000 mg total) by mouth every 8 (eight) hours as needed. (Patient taking differently: Take 1,000 mg by mouth 2 (two) times daily after a meal. )  . amLODipine (NORVASC) 10 MG tablet TAKE 1 TABLET BY MOUTH EVERY DAY (Patient taking differently: Take 10 mg by mouth daily. )  . aspirin EC 81 MG tablet Take 81 mg by mouth daily.  . dapagliflozin propanediol (FARXIGA) 5 MG TABS tablet Take 5 mg by mouth daily.  . insulin degludec (TRESIBA FLEXTOUCH) 100 UNIT/ML SOPN FlexTouch Pen Inject 0.1 mLs (10 Units total) into the skin daily after supper.  . Insulin Pen Needle (PEN NEEDLES 5/16") 30G X 8 MM MISC Pen needles used for lantus injections  . Lancets (ONETOUCH DELICA PLUS [id number]) MISC 1 APPLICATION BY OTHER ROUTE DAILY. TEST BLOOD SUGAR ONCE A DAY  . lipase/protease/amylase (CREON) 36000 UNITS CPEP capsule Take 2 capsules with each meal three times daily  Take 1 capsule with  each snack twice daily (Patient taking differently: Take 36,000-72,000 Units by mouth See admin instructions. Take 2 capsules with each meal three times daily  Take 1 capsule with each snack twice daily)  . losartan (COZAAR) 50 MG tablet TAKE 1 TABLET BY MOUTH EVERY DAY (Patient taking differently: Take 50 mg by mouth daily. )  . lovastatin (MEVACOR) 20 MG tablet TAKE ONE TABLET BY MOUTH ONCE DAILY (Patient taking differently: Take 20 mg by mouth daily. )  . metFORMIN (GLUCOPHAGE) 1000 MG  tablet TAKE 1 TABLET BY MOUTH TWICE DAILY WITH A MEAL (Patient taking differently: Take 1,000 mg by mouth 2 (two) times daily with a meal. TAKE 1 TABLET BY MOUTH TWICE DAILY WITH A MEAL)  . metoprolol tartrate (LOPRESSOR) 100 MG tablet TAKE 1 TABLET BY MOUTH TWICE A DAY (Patient taking differently: Take 100 mg by mouth 2 (two) times daily. )  . omeprazole (PRILOSEC) 40 MG capsule TAKE 1 CAPSULE BY MOUTH EVERY DAY (Patient taking differently: Take 40 mg by mouth daily. )  . ONETOUCH VERIO test strip USE 1 STRIP TO CHECK GLUCOSE ONCE DAILY  . tobramycin (TOBREX) 0.3 % ophthalmic solution Place 1 drop into the left eye every morning. Prn for dry eyes  . [DISCONTINUED] Clobetasol Prop Emollient Base (CLOBETASOL PROPIONATE E) 0.05 % emollient cream Apply topically twice daily for the long term.  . [DISCONTINUED] ofloxacin (OCUFLOX) 0.3 % ophthalmic solution      Allergies:   Lisinopril   Social History   Socioeconomic History  . Marital status: Married    Spouse name: Richard  . Number of children: 1  . Years of education: 66  . Highest education level: Not on file  Occupational History  . Occupation: Retire-Textile    Employer: RETIRED  Tobacco Use  . Smoking status: Former Smoker    Packs/day: 1.00    Years: 45.00    Pack years: 45.00    Quit date: 07/27/2005    Years since quitting: 14.0  . Smokeless tobacco: Former Systems developer    Quit date: 03/2005  Vaping Use  . Vaping Use: Never used  Substance and Sexual Activity  . Alcohol use: Yes    Comment: beer occasionally   . Drug use: No  . Sexual activity: Not Currently    Partners: Male    Comment: 1st intercourse-22, partners- 57, married- 16 yrs   Other Topics Concern  . Not on file  Social History Narrative   Health Care POA:    Emergency Contact: husband, Micaila Ziemba   End of Life Plan:    Who lives with you: Lives with husband   Any pets: gold fish   Diet: Patient has a varied diet and reports stuggeling with portion size  and ice cream.   Exercise: Patient does not have currently exercise routine.   Seatbelts: Patient reports wearing seatbelt when in vehicle.   Sun Exposure/Protection: Patient reports not using sun protection.   Hobbies: Bingo         Social Determinants of Health   Financial Resource Strain:   . Difficulty of Paying Living Expenses:   Food Insecurity:   . Worried About Charity fundraiser in the Last Year:   . Arboriculturist in the Last Year:   Transportation Needs:   . Film/video editor (Medical):   Marland Kitchen Lack of Transportation (Non-Medical):   Physical Activity:   . Days of Exercise per Week:   . Minutes of Exercise per Session:  Stress:   . Feeling of Stress :   Social Connections:   . Frequency of Communication with Friends and Family:   . Frequency of Social Gatherings with Friends and Family:   . Attends Religious Services:   . Active Member of Clubs or Organizations:   . Attends Archivist Meetings:   Marland Kitchen Marital Status:      Family History: The patient's family history includes Cancer in her sister; Heart disease in her father; Tongue cancer in her sister. There is no history of Colon polyps, Rectal cancer, or Stomach cancer.  ROS:   Please see the history of present illness.    All other systems reviewed and are negative.  EKGs/Labs/Other Studies Reviewed:    The following studies were reviewed today:  EKG:  Not performed today  Recent Labs: 01/29/2019: ALT 11; TSH 0.923 08/07/2019: BUN 13; Creatinine, Ser 0.98; Hemoglobin 15.6; Platelets 323; Potassium 4.9; Sodium 141  Recent Lipid Panel    Component Value Date/Time   CHOL 153 01/29/2019 1057   TRIG 216 (H) 01/29/2019 1057   HDL 38 (L) 01/29/2019 1057   CHOLHDL 4.0 01/29/2019 1057   CHOLHDL 4.4 08/29/2016 0920   VLDL 28 08/29/2016 0920   LDLCALC 79 01/29/2019 1057   LDLDIRECT 81 03/21/2013 1431    Physical Exam:    VS:  BP 122/70   Pulse 80   Ht _0  (1.6 m)   Wt 171 lb 12.8 oz  (77.9 kg)   SpO2 93%   BMI 30.43 kg/m     Wt Readings from Last 5 Encounters:  08/07/19 171 lb 12.8 oz (77.9 kg)  08/06/19 175 lb (79.4 kg)  07/29/19 175 lb (79.4 kg)  02/11/19 170 lb (77.1 kg)  02/03/19 171 lb (77.6 kg)     Constitutional: No acute distress Eyes: sclera non-icteric, normal conjunctiva and lids ENMT: normal dentition, moist mucous membranes Cardiovascular: regular rhythm, normal rate, no murmurs. S1 and S2 normal. Radial pulses normal bilaterally. No jugular venous distention.  Respiratory: clear to auscultation bilaterally GI : normal bowel sounds, soft and nontender. No distention.   MSK: extremities warm, well perfused. No edema.  NEURO: grossly nonfocal exam, moves all extremities. PSYCH: alert and oriented x 3, normal mood and affect.   ASSESSMENT:    1. Abnormal stress test    PLAN:    Abnormal stress test - Plan: Basic metabolic panel, CBC  Stress test is abnormal, and she has known coronary artery calcifications. She is experiencing chest pain and has had DOE. We discussed inability to obtain CCTA previously due to HR, with these factors we participated in shared decision making and determined that coronary angiography is next best step. Informed consent performed and obtained.  INFORMED CONSENT: I have reviewed the risks, indications, and alternatives to cardiac catheterization, possible angioplasty, and stenting with the patient. Risks include but are not limited to bleeding, infection, vascular injury, stroke, myocardial infection, arrhythmia, kidney injury, radiation-related injury in the case of prolonged fluoroscopy use, emergency cardiac surgery, and death. The patient understands the risks of serious complication is 1-2 in 4315 with diagnostic cardiac cath and 1-2% or less with angioplasty/stenting.    Total time of encounter: 30 minutes total time of encounter, including 25 minutes spent in face-to-face patient care on the date of this  encounter. This time includes coordination of care and counseling regarding above mentioned problem list. Remainder of non-face-to-face time involved reviewing chart documents/testing relevant to the patient encounter and documentation in the  medical record. I have independently reviewed documentation from referring provider.   Cherlynn Kaiser, MD [place]  [facility]    Medication Adjustments/Labs and Tests Ordered: Current medicines are reviewed at length with the patient today.  Concerns regarding medicines are outlined above.  Orders Placed This Encounter  Procedures  . Basic metabolic panel  . CBC   No orders of the defined types were placed in this encounter.   Patient Instructions  Medication Instructions:  START TAKING ASPIRIN 81 MG DAILY  If you need a refill on your cardiac medications before your next appointment, please call your pharmacy*   Lab Work: CBC BMP  If you have labs (blood work) drawn today and your tests are completely normal, you will receive your results only by: Marland Kitchen MyChart Message (if you have MyChart) OR . A paper copy in the mail If you have any lab test that is abnormal or we need to change your treatment, we will call you to review the results.   Testing/Procedures:  WILL BE SCHEDULE AT [facility] Your physician has requested that you have a cardiac catheterization. Cardiac catheterization is used to diagnose and/or treat various heart conditions. Doctors may recommend this procedure for a number of different reasons. The most common reason is to evaluate chest pain. Chest pain can be a symptom of coronary artery disease (CAD), and cardiac catheterization can show whether plaque is narrowing or blocking your heart's arteries. This procedure is also used to evaluate the valves, as well as measure the blood flow and oxygen levels in different parts of your heart. For further information please visit [email or web address]. Please follow  instruction sheet, as given.   Follow-Up: At [facility], you and your health needs are our priority.  As part of our continuing mission to provide you with exceptional heart care, we have created designated Provider Care Teams.  These Care Teams include your primary Cardiologist (physician) and Advanced Practice Providers (APPs -  Physician Assistants and Nurse Practitioners) who all work together to provide you with the care you need, when you need it.  We recommend signing up for the patient portal called "MyChart".  Sign up information is provided on this After Visit Summary.  MyChart is used to connect with patients for Virtual Visits (Telemedicine).  Patients are able to view lab/test results, encounter notes, upcoming appointments, etc.  Non-urgent messages can be sent to your provider as well.   To learn more about what you can do with MyChart, go to [email or web address].    Your next appointment:   4 week(s)  The format for your next appointment:   In Person  Provider:   Cherlynn Kaiser, MD   Other Instructions       [place] [address] Dept: [phone] Loc: [place]  08/07/2019  You are scheduled for a Cardiac Catheterization on Wednesday, June 23 with Dr. Daneen Schick.  1. Please arrive at the [facility] (Main Entrance A) at [facility]: [address] [place], [place] [address] at 6:30 AM (This time is two hours before your procedure to ensure your preparation). Free valet parking service is available.   Special note: Every effort is made to have your procedure done on time. Please understand that emergencies sometimes delay scheduled procedures.  2. Diet: Do not eat solid foods after midnight.  The patient may have clear liquids until 5am upon the  day of the procedure.  3. Labs: You will need to have blood  drawn on  CBC, BMP , June 10 at [place]  Open: 8am - 5pm (Lunch 12:30 - 1:30)   Phone: [phone]. You do not need to be fasting.  4. Medication instructions in preparation for your procedure:   Contrast Allergy: No  Stop taking, Avapro (Irbesartan), Cozaar (Losartan) Wednesday, June 23,  Take only 5 units of insulin the night before your procedure. Do not take any insulin on the day of the procedure.  Do not take Diabetes Med Glucophage (Metformin) on the day of the procedure and HOLD 48 HOURS AFTER THE PROCEDURE.  On the morning of your procedure, take your Aspirin 81  MG and any morning medicines NOT listed above.  You may use sips of water.  5. Plan for one night stay--bring personal belongings. 6. Bring a current list of your medications and current insurance cards. 7. You MUST have a responsible person to drive you home. 8. Someone MUST be with you the first 24 hours after you arrive home or your discharge will be delayed. 9. Please wear clothes that are easy to get on and off and wear slip-on shoes.  Thank you for allowing [place] to care for you!   -- [facility]

## 2019-08-19 NOTE — Telephone Encounter (Addendum)
Pt contacted pre-catheterization scheduled at [facility] for: Wednesday August 20, 2019 8:30 AM Verified arrival time and place: [facility]) at: 6:30 AM   No solid food after midnight prior to cath, clear liquids until 5 AM day of procedure.  Hold: Farxiga-AM of procedure Treshiba/Insulin-AM of procedure/1/2 usual HS dose prior to procedure. Metformin-day of procedure and 48 hours post procedure.  Except hold medications AM meds can be  taken pre-cath with sips of water including: ASA 81 mg   Confirmed patient has responsible adult to drive home post procedure and observe 24 hours after arriving home: yes  You are allowed ONE visitor in the waiting room during your procedure. Both you and your visitor must wear masks.      COVID-19 Pre-Screening Questions:  . In the past 7 to 10 days have you had a new cough, shortness of breath, headache, congestion, fever (100 or greater) unexplained body aches, new sore throat, or sudden loss of taste or sense of smell? no . In the past 7 to 10 days have you been around anyone with known Covid 19? no   Reviewed procedure/mask/visitor instructions, COVID-19 screening questions with patient.

## 2019-08-19 NOTE — Telephone Encounter (Signed)
Transferred call to [employer].

## 2019-08-19 NOTE — H&P (View-Only) (Signed)
Cardiology Office Note:    Date:  08/07/2019   ID:  NAMI STRAWDER, DOB [birth year], MRN [id number]  PCP:  Girtha Rm, NP-C  Cardiologist:  No primary care provider on file.  Electrophysiologist:  None   Referring MD: Girtha Rm, NP-C   Chief Complaint: f/u chest pain and test results.  History of Present Illness:    Jessica Hart is a 79 y.o. female with a history of diabetes mellitus, hypertension, hyperlipidemia, and coronary artery calcifications seen on a lung cancer screening CT.  She has a 45-pack-year smoking history.   We discussed results of stress testing obtained for chest pain. Stress nuclear study showed:  Nuclear stress EF: 36%.  There was no ST segment deviation noted during stress.  Defect 1: There is a small defect of mild severity present in the apical anterior and apex location.  Defect 2: There is a large defect of severe severity present in the basal inferior, basal inferolateral, mid inferior, mid inferolateral, apical inferior and apical lateral location.  This is an intermediate risk study.  The left ventricular ejection fraction is moderately decreased (30-44%).  There is a lot of extracardiac activity adjacent to the inferior and inferolateral myocardium. This makes the study more difficult to interpret and can cause artifact. Wall motion is abnormal in these regions as well, raising the possibility of prior infarct. There is no ischemia.  We discussed these results in detail in the setting of chest pain, DOE and known coronary artery calcifications. She continues to have chest pain at rest.   Past Medical History:  Diagnosis Date  . Arthritis   . Cataract bilaterl 3 years ago  . Diabetes mellitus 02/27/2005  . Diabetes mellitus type 2, controlled, without complications ([place]) 3/00/9233   Qualifier: Diagnosis of  By: Drucie Ip    . Diverticulosis   . Echocardiogram findings abnormal, without diagnosis 2005   EF 47%, no  ischemia, no infarction  . Former smoker 01/10/2017   45 pack year history, quit in 2007   . GERD (gastroesophageal reflux disease)   . History of bone density study 2006   Lt hip = -1.0, L spine = -1.8   . Hyperlipidemia   . Hypertension     Past Surgical History:  Procedure Laterality Date  . ABDOMINAL HYSTERECTOMY  1989   one ovary remains per patient  . TONSILECTOMY, ADENOIDECTOMY, BILATERAL MYRINGOTOMY AND TUBES  1965    Current Medications: Current Meds  Medication Sig  . acetaminophen (TYLENOL) 500 MG tablet Take 2 tablets (1,000 mg total) by mouth every 8 (eight) hours as needed. (Patient taking differently: Take 1,000 mg by mouth 2 (two) times daily after a meal. )  . amLODipine (NORVASC) 10 MG tablet TAKE 1 TABLET BY MOUTH EVERY DAY (Patient taking differently: Take 10 mg by mouth daily. )  . aspirin EC 81 MG tablet Take 81 mg by mouth daily.  . dapagliflozin propanediol (FARXIGA) 5 MG TABS tablet Take 5 mg by mouth daily.  . insulin degludec (TRESIBA FLEXTOUCH) 100 UNIT/ML SOPN FlexTouch Pen Inject 0.1 mLs (10 Units total) into the skin daily after supper.  . Insulin Pen Needle (PEN NEEDLES 5/16") 30G X 8 MM MISC Pen needles used for lantus injections  . Lancets (ONETOUCH DELICA PLUS [id number]) MISC 1 APPLICATION BY OTHER ROUTE DAILY. TEST BLOOD SUGAR ONCE A DAY  . lipase/protease/amylase (CREON) 36000 UNITS CPEP capsule Take 2 capsules with each meal three times daily  Take 1 capsule with  each snack twice daily (Patient taking differently: Take 36,000-72,000 Units by mouth See admin instructions. Take 2 capsules with each meal three times daily  Take 1 capsule with each snack twice daily)  . losartan (COZAAR) 50 MG tablet TAKE 1 TABLET BY MOUTH EVERY DAY (Patient taking differently: Take 50 mg by mouth daily. )  . lovastatin (MEVACOR) 20 MG tablet TAKE ONE TABLET BY MOUTH ONCE DAILY (Patient taking differently: Take 20 mg by mouth daily. )  . metFORMIN (GLUCOPHAGE) 1000 MG  tablet TAKE 1 TABLET BY MOUTH TWICE DAILY WITH A MEAL (Patient taking differently: Take 1,000 mg by mouth 2 (two) times daily with a meal. TAKE 1 TABLET BY MOUTH TWICE DAILY WITH A MEAL)  . metoprolol tartrate (LOPRESSOR) 100 MG tablet TAKE 1 TABLET BY MOUTH TWICE A DAY (Patient taking differently: Take 100 mg by mouth 2 (two) times daily. )  . omeprazole (PRILOSEC) 40 MG capsule TAKE 1 CAPSULE BY MOUTH EVERY DAY (Patient taking differently: Take 40 mg by mouth daily. )  . ONETOUCH VERIO test strip USE 1 STRIP TO CHECK GLUCOSE ONCE DAILY  . tobramycin (TOBREX) 0.3 % ophthalmic solution Place 1 drop into the left eye every morning. Prn for dry eyes  . [DISCONTINUED] Clobetasol Prop Emollient Base (CLOBETASOL PROPIONATE E) 0.05 % emollient cream Apply topically twice daily for the long term.  . [DISCONTINUED] ofloxacin (OCUFLOX) 0.3 % ophthalmic solution      Allergies:   Lisinopril   Social History   Socioeconomic History  . Marital status: Married    Spouse name: Richard  . Number of children: 1  . Years of education: 66  . Highest education level: Not on file  Occupational History  . Occupation: Retire-Textile    Employer: RETIRED  Tobacco Use  . Smoking status: Former Smoker    Packs/day: 1.00    Years: 45.00    Pack years: 45.00    Quit date: 07/27/2005    Years since quitting: 14.0  . Smokeless tobacco: Former Systems developer    Quit date: 03/2005  Vaping Use  . Vaping Use: Never used  Substance and Sexual Activity  . Alcohol use: Yes    Comment: beer occasionally   . Drug use: No  . Sexual activity: Not Currently    Partners: Male    Comment: 1st intercourse-22, partners- 57, married- 16 yrs   Other Topics Concern  . Not on file  Social History Narrative   Health Care POA:    Emergency Contact: husband, Micaila Ziemba   End of Life Plan:    Who lives with you: Lives with husband   Any pets: gold fish   Diet: Patient has a varied diet and reports stuggeling with portion size  and ice cream.   Exercise: Patient does not have currently exercise routine.   Seatbelts: Patient reports wearing seatbelt when in vehicle.   Sun Exposure/Protection: Patient reports not using sun protection.   Hobbies: Bingo         Social Determinants of Health   Financial Resource Strain:   . Difficulty of Paying Living Expenses:   Food Insecurity:   . Worried About Charity fundraiser in the Last Year:   . Arboriculturist in the Last Year:   Transportation Needs:   . Film/video editor (Medical):   Marland Kitchen Lack of Transportation (Non-Medical):   Physical Activity:   . Days of Exercise per Week:   . Minutes of Exercise per Session:  Stress:   . Feeling of Stress :   Social Connections:   . Frequency of Communication with Friends and Family:   . Frequency of Social Gatherings with Friends and Family:   . Attends Religious Services:   . Active Member of Clubs or Organizations:   . Attends Archivist Meetings:   Marland Kitchen Marital Status:      Family History: The patient's family history includes Cancer in her sister; Heart disease in her father; Tongue cancer in her sister. There is no history of Colon polyps, Rectal cancer, or Stomach cancer.  ROS:   Please see the history of present illness.    All other systems reviewed and are negative.  EKGs/Labs/Other Studies Reviewed:    The following studies were reviewed today:  EKG:  Not performed today  Recent Labs: 01/29/2019: ALT 11; TSH 0.923 08/07/2019: BUN 13; Creatinine, Ser 0.98; Hemoglobin 15.6; Platelets 323; Potassium 4.9; Sodium 141  Recent Lipid Panel    Component Value Date/Time   CHOL 153 01/29/2019 1057   TRIG 216 (H) 01/29/2019 1057   HDL 38 (L) 01/29/2019 1057   CHOLHDL 4.0 01/29/2019 1057   CHOLHDL 4.4 08/29/2016 0920   VLDL 28 08/29/2016 0920   LDLCALC 79 01/29/2019 1057   LDLDIRECT 81 03/21/2013 1431    Physical Exam:    VS:  BP 122/70   Pulse 80   Ht _0  (1.6 m)   Wt 171 lb 12.8 oz  (77.9 kg)   SpO2 93%   BMI 30.43 kg/m     Wt Readings from Last 5 Encounters:  08/07/19 171 lb 12.8 oz (77.9 kg)  08/06/19 175 lb (79.4 kg)  07/29/19 175 lb (79.4 kg)  02/11/19 170 lb (77.1 kg)  02/03/19 171 lb (77.6 kg)     Constitutional: No acute distress Eyes: sclera non-icteric, normal conjunctiva and lids ENMT: normal dentition, moist mucous membranes Cardiovascular: regular rhythm, normal rate, no murmurs. S1 and S2 normal. Radial pulses normal bilaterally. No jugular venous distention.  Respiratory: clear to auscultation bilaterally GI : normal bowel sounds, soft and nontender. No distention.   MSK: extremities warm, well perfused. No edema.  NEURO: grossly nonfocal exam, moves all extremities. PSYCH: alert and oriented x 3, normal mood and affect.   ASSESSMENT:    1. Abnormal stress test    PLAN:    Abnormal stress test - Plan: Basic metabolic panel, CBC  Stress test is abnormal, and she has known coronary artery calcifications. She is experiencing chest pain and has had DOE. We discussed inability to obtain CCTA previously due to HR, with these factors we participated in shared decision making and determined that coronary angiography is next best step. Informed consent performed and obtained.  INFORMED CONSENT: I have reviewed the risks, indications, and alternatives to cardiac catheterization, possible angioplasty, and stenting with the patient. Risks include but are not limited to bleeding, infection, vascular injury, stroke, myocardial infection, arrhythmia, kidney injury, radiation-related injury in the case of prolonged fluoroscopy use, emergency cardiac surgery, and death. The patient understands the risks of serious complication is 1-2 in 4315 with diagnostic cardiac cath and 1-2% or less with angioplasty/stenting.    Total time of encounter: 30 minutes total time of encounter, including 25 minutes spent in face-to-face patient care on the date of this  encounter. This time includes coordination of care and counseling regarding above mentioned problem list. Remainder of non-face-to-face time involved reviewing chart documents/testing relevant to the patient encounter and documentation in the  medical record. I have independently reviewed documentation from referring provider.   Cherlynn Kaiser, MD [place]  [facility]    Medication Adjustments/Labs and Tests Ordered: Current medicines are reviewed at length with the patient today.  Concerns regarding medicines are outlined above.  Orders Placed This Encounter  Procedures  . Basic metabolic panel  . CBC   No orders of the defined types were placed in this encounter.   Patient Instructions  Medication Instructions:  START TAKING ASPIRIN 81 MG DAILY  If you need a refill on your cardiac medications before your next appointment, please call your pharmacy*   Lab Work: CBC BMP  If you have labs (blood work) drawn today and your tests are completely normal, you will receive your results only by: Marland Kitchen MyChart Message (if you have MyChart) OR . A paper copy in the mail If you have any lab test that is abnormal or we need to change your treatment, we will call you to review the results.   Testing/Procedures:  WILL BE SCHEDULE AT [facility] Your physician has requested that you have a cardiac catheterization. Cardiac catheterization is used to diagnose and/or treat various heart conditions. Doctors may recommend this procedure for a number of different reasons. The most common reason is to evaluate chest pain. Chest pain can be a symptom of coronary artery disease (CAD), and cardiac catheterization can show whether plaque is narrowing or blocking your heart's arteries. This procedure is also used to evaluate the valves, as well as measure the blood flow and oxygen levels in different parts of your heart. For further information please visit [email or web address]. Please follow  instruction sheet, as given.   Follow-Up: At [facility], you and your health needs are our priority.  As part of our continuing mission to provide you with exceptional heart care, we have created designated Provider Care Teams.  These Care Teams include your primary Cardiologist (physician) and Advanced Practice Providers (APPs -  Physician Assistants and Nurse Practitioners) who all work together to provide you with the care you need, when you need it.  We recommend signing up for the patient portal called "MyChart".  Sign up information is provided on this After Visit Summary.  MyChart is used to connect with patients for Virtual Visits (Telemedicine).  Patients are able to view lab/test results, encounter notes, upcoming appointments, etc.  Non-urgent messages can be sent to your provider as well.   To learn more about what you can do with MyChart, go to [email or web address].    Your next appointment:   4 week(s)  The format for your next appointment:   In Person  Provider:   Cherlynn Kaiser, MD   Other Instructions       [place] [address] Dept: [phone] Loc: [place]  08/07/2019  You are scheduled for a Cardiac Catheterization on Wednesday, June 23 with Dr. Daneen Schick.  1. Please arrive at the [facility] (Main Entrance A) at [facility]: [address] [place], [place] [address] at 6:30 AM (This time is two hours before your procedure to ensure your preparation). Free valet parking service is available.   Special note: Every effort is made to have your procedure done on time. Please understand that emergencies sometimes delay scheduled procedures.  2. Diet: Do not eat solid foods after midnight.  The patient may have clear liquids until 5am upon the  day of the procedure.  3. Labs: You will need to have blood  drawn on  CBC, BMP , June 10 at [place]  Open: 8am - 5pm (Lunch 12:30 - 1:30)   Phone: [phone]. You do not need to be fasting.  4. Medication instructions in preparation for your procedure:   Contrast Allergy: No  Stop taking, Avapro (Irbesartan), Cozaar (Losartan) Wednesday, June 23,  Take only 5 units of insulin the night before your procedure. Do not take any insulin on the day of the procedure.  Do not take Diabetes Med Glucophage (Metformin) on the day of the procedure and HOLD 48 HOURS AFTER THE PROCEDURE.  On the morning of your procedure, take your Aspirin 81  MG and any morning medicines NOT listed above.  You may use sips of water.  5. Plan for one night stay--bring personal belongings. 6. Bring a current list of your medications and current insurance cards. 7. You MUST have a responsible person to drive you home. 8. Someone MUST be with you the first 24 hours after you arrive home or your discharge will be delayed. 9. Please wear clothes that are easy to get on and off and wear slip-on shoes.  Thank you for allowing [place] to care for you!   -- [facility]

## 2019-08-20 ENCOUNTER — Ambulatory Visit (HOSPITAL_COMMUNITY)
Admission: RE | Admit: 2019-08-20 | Discharge: 2019-08-20 | Disposition: A | Discharge status: Home / Self Care | Payer: Medicare HMO | Attending: Interventional Cardiology | Admitting: Interventional Cardiology

## 2019-08-20 ENCOUNTER — Encounter (HOSPITAL_COMMUNITY)
Admission: RE | Disposition: A | Discharge status: Home / Self Care | Payer: Self-pay | Source: Home / Self Care | Attending: Interventional Cardiology

## 2019-08-20 ENCOUNTER — Encounter (HOSPITAL_COMMUNITY): Payer: Self-pay | Admitting: Interventional Cardiology

## 2019-08-20 ENCOUNTER — Other Ambulatory Visit: Payer: Self-pay | Admitting: Family Medicine

## 2019-08-20 ENCOUNTER — Other Ambulatory Visit: Payer: Self-pay

## 2019-08-20 DIAGNOSIS — E785 Hyperlipidemia, unspecified: Secondary | ICD-10-CM | POA: Diagnosis not present

## 2019-08-20 DIAGNOSIS — Z7982 Long term (current) use of aspirin: Secondary | ICD-10-CM | POA: Diagnosis not present

## 2019-08-20 DIAGNOSIS — R9439 Abnormal result of other cardiovascular function study: Secondary | ICD-10-CM | POA: Diagnosis present

## 2019-08-20 DIAGNOSIS — K5790 Diverticulosis of intestine, part unspecified, without perforation or abscess without bleeding: Secondary | ICD-10-CM | POA: Diagnosis not present

## 2019-08-20 DIAGNOSIS — I25118 Atherosclerotic heart disease of native coronary artery with other forms of angina pectoris: Secondary | ICD-10-CM

## 2019-08-20 DIAGNOSIS — I2582 Chronic total occlusion of coronary artery: Secondary | ICD-10-CM | POA: Diagnosis not present

## 2019-08-20 DIAGNOSIS — Z87891 Personal history of nicotine dependence: Secondary | ICD-10-CM

## 2019-08-20 DIAGNOSIS — I2510 Atherosclerotic heart disease of native coronary artery without angina pectoris: Secondary | ICD-10-CM | POA: Insufficient documentation

## 2019-08-20 DIAGNOSIS — Z79899 Other long term (current) drug therapy: Secondary | ICD-10-CM | POA: Diagnosis not present

## 2019-08-20 DIAGNOSIS — R69 Illness, unspecified: Secondary | ICD-10-CM | POA: Diagnosis not present

## 2019-08-20 DIAGNOSIS — Z794 Long term (current) use of insulin: Secondary | ICD-10-CM | POA: Diagnosis not present

## 2019-08-20 DIAGNOSIS — E119 Type 2 diabetes mellitus without complications: Secondary | ICD-10-CM | POA: Diagnosis not present

## 2019-08-20 DIAGNOSIS — K219 Gastro-esophageal reflux disease without esophagitis: Secondary | ICD-10-CM | POA: Diagnosis not present

## 2019-08-20 DIAGNOSIS — I255 Ischemic cardiomyopathy: Secondary | ICD-10-CM | POA: Diagnosis not present

## 2019-08-20 DIAGNOSIS — I152 Hypertension secondary to endocrine disorders: Secondary | ICD-10-CM | POA: Diagnosis present

## 2019-08-20 DIAGNOSIS — I10 Essential (primary) hypertension: Secondary | ICD-10-CM | POA: Insufficient documentation

## 2019-08-20 DIAGNOSIS — I7090 Unspecified atherosclerosis: Secondary | ICD-10-CM | POA: Diagnosis present

## 2019-08-20 HISTORY — PX: CARDIAC CATHETERIZATION: SHX172

## 2019-08-20 HISTORY — PX: LEFT HEART CATH AND CORONARY ANGIOGRAPHY: CATH118249

## 2019-08-20 LAB — GLUCOSE, CAPILLARY
Glucose-Capillary: 115 mg/dL — ABNORMAL HIGH (ref 70–99)
Glucose-Capillary: 77 mg/dL (ref 70–99)

## 2019-08-20 SURGERY — LEFT HEART CATH AND CORONARY ANGIOGRAPHY
Anesthesia: LOCAL

## 2019-08-20 MED ORDER — ASPIRIN 81 MG PO CHEW: 81.0000 mg | CHEWABLE_TABLET | Freq: Every day | ORAL | Status: DC

## 2019-08-20 MED ORDER — SODIUM CHLORIDE 0.9 % WEIGHT BASED INFUSION: 1.0000 mL/kg/h | INTRAVENOUS | Status: DC

## 2019-08-20 MED ORDER — HEPARIN (PORCINE) IN NACL 1000-0.9 UT/500ML-% IV SOLN
INTRAVENOUS | Status: DC | PRN
  Administered 2019-08-20 (×3): 500 mL

## 2019-08-20 MED ORDER — LABETALOL HCL 5 MG/ML IV SOLN: 10.0000 mg | INTRAVENOUS | Status: DC | PRN

## 2019-08-20 MED ORDER — SODIUM CHLORIDE 0.9 % WEIGHT BASED INFUSION
3.0000 mL/kg/h | INTRAVENOUS | Status: AC
  Administered 2019-08-20: 3 mL/kg/h via INTRAVENOUS

## 2019-08-20 MED ORDER — HEPARIN (PORCINE) IN NACL 1000-0.9 UT/500ML-% IV SOLN
INTRAVENOUS | Status: AC
  Filled 2019-08-20: qty 1000

## 2019-08-20 MED ORDER — SODIUM CHLORIDE 0.9% FLUSH: 3.0000 mL | INTRAVENOUS | Status: DC | PRN

## 2019-08-20 MED ORDER — LIDOCAINE HCL (PF) 1 % IJ SOLN
INTRAMUSCULAR | Status: AC
  Filled 2019-08-20: qty 30

## 2019-08-20 MED ORDER — HEPARIN SODIUM (PORCINE) 1000 UNIT/ML IJ SOLN
INTRAMUSCULAR | Status: AC
  Filled 2019-08-20: qty 1

## 2019-08-20 MED ORDER — MIDAZOLAM HCL 2 MG/2ML IJ SOLN
INTRAMUSCULAR | Status: AC
  Filled 2019-08-20: qty 2

## 2019-08-20 MED ORDER — SODIUM CHLORIDE 0.9 % IV SOLN
INTRAVENOUS | Status: AC | PRN
  Administered 2019-08-20: 500 mL via INTRAVENOUS

## 2019-08-20 MED ORDER — NITROGLYCERIN 1 MG/10 ML FOR IR/CATH LAB
INTRA_ARTERIAL | Status: AC
  Filled 2019-08-20: qty 10

## 2019-08-20 MED ORDER — ACETAMINOPHEN 325 MG PO TABS: 650.0000 mg | ORAL_TABLET | ORAL | Status: DC | PRN

## 2019-08-20 MED ORDER — VERAPAMIL HCL 2.5 MG/ML IV SOLN
INTRAVENOUS | Status: AC
  Filled 2019-08-20: qty 2

## 2019-08-20 MED ORDER — HYDRALAZINE HCL 20 MG/ML IJ SOLN: 10.0000 mg | INTRAMUSCULAR | Status: DC | PRN

## 2019-08-20 MED ORDER — HEPARIN SODIUM (PORCINE) 1000 UNIT/ML IJ SOLN
INTRAMUSCULAR | Status: DC | PRN
  Administered 2019-08-20: 4000 [IU] via INTRAVENOUS

## 2019-08-20 MED ORDER — LIDOCAINE HCL (PF) 1 % IJ SOLN
INTRAMUSCULAR | Status: DC | PRN
  Administered 2019-08-20: 2 mL

## 2019-08-20 MED ORDER — SODIUM CHLORIDE 0.9 % IV SOLN: 250.0000 mL | INTRAVENOUS | Status: DC | PRN

## 2019-08-20 MED ORDER — NITROGLYCERIN 1 MG/10 ML FOR IR/CATH LAB
INTRA_ARTERIAL | Status: DC | PRN
  Administered 2019-08-20: 200 ug via INTRACORONARY

## 2019-08-20 MED ORDER — FENTANYL CITRATE (PF) 100 MCG/2ML IJ SOLN
INTRAMUSCULAR | Status: AC
  Filled 2019-08-20: qty 2

## 2019-08-20 MED ORDER — MIDAZOLAM HCL 2 MG/2ML IJ SOLN
INTRAMUSCULAR | Status: DC | PRN
  Administered 2019-08-20: 1 mg via INTRAVENOUS

## 2019-08-20 MED ORDER — FENTANYL CITRATE (PF) 100 MCG/2ML IJ SOLN
INTRAMUSCULAR | Status: DC | PRN
  Administered 2019-08-20: 25 ug via INTRAVENOUS

## 2019-08-20 MED ORDER — IOHEXOL 350 MG/ML SOLN
INTRAVENOUS | Status: DC | PRN
  Administered 2019-08-20: 85 mL

## 2019-08-20 MED ORDER — SODIUM CHLORIDE 0.9 % IV SOLN: INTRAVENOUS | Status: DC

## 2019-08-20 MED ORDER — SODIUM CHLORIDE 0.9% FLUSH: 3.0000 mL | Freq: Two times a day (BID) | INTRAVENOUS | Status: DC

## 2019-08-20 MED ORDER — ONDANSETRON HCL 4 MG/2ML IJ SOLN: 4.0000 mg | Freq: Four times a day (QID) | INTRAMUSCULAR | Status: DC | PRN

## 2019-08-20 MED ORDER — VERAPAMIL HCL 2.5 MG/ML IV SOLN
INTRAVENOUS | Status: DC | PRN
  Administered 2019-08-20: 10 mL via INTRA_ARTERIAL

## 2019-08-20 MED ORDER — OXYCODONE HCL 5 MG PO TABS: 5.0000 mg | ORAL_TABLET | ORAL | Status: DC | PRN

## 2019-08-20 MED ORDER — HEPARIN (PORCINE) IN NACL 1000-0.9 UT/500ML-% IV SOLN
INTRAVENOUS | Status: AC
  Filled 2019-08-20: qty 500

## 2019-08-20 SURGICAL SUPPLY — 10 items
CATH 5FR JL3.5 JR4 ANG PIG MP (CATHETERS) ×1 IMPLANT
DEVICE RAD COMP TR BAND LRG (VASCULAR PRODUCTS) ×1 IMPLANT
GLIDESHEATH SLEND A-KIT 6F 22G (SHEATH) ×1 IMPLANT
GUIDEWIRE INQWIRE 1.5J.035X260 (WIRE) IMPLANT
INQWIRE 1.5J .035X260CM (WIRE) ×2
KIT HEART LEFT (KITS) ×2 IMPLANT
PACK CARDIAC CATHETERIZATION (CUSTOM PROCEDURE TRAY) ×2 IMPLANT
SHEATH PROBE COVER 6X72 (BAG) ×1 IMPLANT
TRANSDUCER W/STOPCOCK (MISCELLANEOUS) ×2 IMPLANT
TUBING CIL FLEX 10 FLL-RA (TUBING) ×2 IMPLANT

## 2019-08-20 NOTE — CV Procedure (Signed)
   Coronary angiography, left ventriculography, and hemodynamic recordings in the setting of ischemic cardiomyopathy with low EF.  Arterial access via right radial using real-time vascular ultrasound.  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  Totally occluded proximal RCA with left-to-right collaterals.  Diffusely diseased circumflex with proximal 70% and distal 95%.  Normal left main  Severe three-vessel coronary calcification.  Severe inferobasal hypokinesis.  Apical hypokinesis.  EF 35 to 40%.  Normal LVEDP.  Refer to [facility] for coronary revascularization.

## 2019-08-20 NOTE — Telephone Encounter (Signed)
Pt currently in hospital so I left a message stating I would refill for 30 days until she got out of hospital to call to get something scheduled

## 2019-08-20 NOTE — Discharge Instructions (Signed)
Drink plenty of fluids for 48 hours and keep wrist elevated at heart level for 24 hours Hold Metformin for 48 hours. May resume June 26th.   Radial Site Care   This sheet gives you information about how to care for yourself after your procedure. Your health care provider may also give you more specific instructions. If you have problems or questions, contact your health care provider. What can I expect after the procedure? After the procedure, it is common to have:  Bruising and tenderness at the catheter insertion area. Follow these instructions at home: Medicines  Take over-the-counter and prescription medicines only as told by your health care provider. Insertion site care 1. Follow instructions from your health care provider about how to take care of your insertion site. Make sure you: ? Wash your hands with soap and water before you change your bandage (dressing). If soap and water are not available, use hand sanitizer. ? Remove your dressing as told by your health care provider. In 24 hours 2. Check your insertion site every day for signs of infection. Check for: ? Redness, swelling, or pain. ? Fluid or blood. ? Pus or a bad smell. ? Warmth. 3. Do not take baths, swim, or use a hot tub until your health care provider approves. 4. You may shower 24-48 hours after the procedure, or as directed by your health care provider. ? Remove the dressing and gently wash the site with plain soap and water. ? Pat the area dry with a clean towel. ? Do not rub the site. That could cause bleeding. 5. Do not apply powder or lotion to the site. Activity   1. For 24 hours after the procedure, or as directed by your health care provider: ? Do not flex or bend the affected arm. ? Do not push or pull heavy objects with the affected arm. ? Do not drive yourself home from the hospital or clinic. You may drive 24 hours after the procedure unless your health care provider tells you not to. ? Do not  operate machinery or power tools. 2. Do not lift anything that is heavier than 10 lb (4.5 kg), or the limit that you are told, until your health care provider says that it is safe.  For 4 days 3. Ask your health care provider when it is okay to: ? Return to work or school. ? Resume usual physical activities or sports. ? Resume sexual activity. General instructions  If the catheter site starts to bleed, raise your arm and put firm pressure on the site. If the bleeding does not stop, get help right away. This is a medical emergency.  If you went home on the same day as your procedure, a responsible adult should be with you for the first 24 hours after you arrive home.  Keep all follow-up visits as told by your health care provider. This is important. Contact a health care provider if:  You have a fever.  You have redness, swelling, or yellow drainage around your insertion site. Get help right away if:  You have unusual pain at the radial site.  The catheter insertion area swells very fast.  The insertion area is bleeding, and the bleeding does not stop when you hold steady pressure on the area.  Your arm or hand becomes pale, cool, tingly, or numb. These symptoms may represent a serious problem that is an emergency. Do not wait to see if the symptoms will go away. Get medical help right away.  Call your local emergency services (911 in the [place] Do not drive yourself to the hospital. Summary  After the procedure, it is common to have bruising and tenderness at the site.  Follow instructions from your health care provider about how to take care of your radial site wound. Check the wound every day for signs of infection.  Do not lift anything that is heavier than 10 lb (4.5 kg), or the limit that you are told, until your health care provider says that it is safe. This information is not intended to replace advice given to you by your health care provider. Make sure you discuss any  questions you have with your health care provider. Document Revised: 03/21/2017 Document Reviewed: 03/21/2017 Elsevier Patient Education  2020 [employer].

## 2019-08-20 NOTE — Interval H&P Note (Signed)
History and Physical Interval Note:  08/20/2019 7:52 AMCath Lab Visit (complete for each Cath Lab visit)  Clinical Evaluation Leading to the Procedure:   ACS: No.  Non-ACS:    Anginal Classification: CCS II  Anti-ischemic medical therapy: Maximal Therapy (2 or more classes of medications)  Non-Invasive Test Results: Intermediate-risk stress test findings: cardiac mortality 1-3%/year  Prior CABG: No previous CABG        Jessica Hart  has presented today for surgery, with the diagnosis of abnormal stress test.  The various methods of treatment have been discussed with the patient and family. After consideration of risks, benefits and other options for treatment, the patient has consented to  Procedure(s): LEFT HEART CATH AND CORONARY ANGIOGRAPHY (N/A) as a surgical intervention.  The patient's history has been reviewed, patient examined, no change in status, stable for surgery.  I have reviewed the patient's chart and labs.  Questions were answered to the patient's satisfaction.     Belva Crome III

## 2019-08-20 NOTE — Progress Notes (Signed)
TCTS consulted for outpt CABG evaluation. Dr. Abran Duke office will call pt with an appointment.

## 2019-08-26 ENCOUNTER — Encounter: Payer: Self-pay | Admitting: Thoracic Surgery (Cardiothoracic Vascular Surgery)

## 2019-08-26 ENCOUNTER — Institutional Professional Consult (permissible substitution): Payer: Medicare HMO | Admitting: Thoracic Surgery (Cardiothoracic Vascular Surgery)

## 2019-08-26 ENCOUNTER — Other Ambulatory Visit: Payer: Self-pay

## 2019-08-26 VITALS — BP 145/75 | HR 71 | Temp 97.6°F | Resp 20 | Ht 63.0 in | Wt 170.0 lb

## 2019-08-26 DIAGNOSIS — I2510 Atherosclerotic heart disease of native coronary artery without angina pectoris: Secondary | ICD-10-CM | POA: Diagnosis not present

## 2019-08-26 NOTE — Progress Notes (Signed)
[address]       [place] [address]             [phone]        [name] Easley Medical Record #[id number] Date of Birth: 01/31/1941  Referring: Belva Crome, MD Primary Care: Girtha Rm, NP-C Primary Cardiologist:No primary care provider on file.  Chief Complaint:    Chief Complaint  Patient presents with  . 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  Over the past several months she has had progressive exertional angina to the point where she becomes short of breath and has chest pressure simply with washing dishes.  She is not very mobile and that she spends most of the day in her recliner watching television.  She denies any lower extremity swelling or pain at rest.  She is able to lay flat without much pain or shortness of breath.  She does endorse some left leg cramps, at rest.  She is a former smoker quit about 15 years ago.  She has been diagnosed with pancreatic insufficiency has been placed on Creon.   Past Medical and Surgical History: Previous Chest Surgery: None Previous Chest Radiation: No Diabetes Mellitus: Yes.  HbA1C 6.8 Creatinine: 0.9  Past Medical History:  Diagnosis Date  . Arthritis   . Cataract bilaterl 3 years ago  . Diabetes mellitus 02/27/2005  . Diabetes mellitus type 2, controlled, without complications ([facility]) 8/46/9629   Qualifier: Diagnosis of  By: Drucie Ip    . Diverticulosis   . Echocardiogram findings abnormal, without diagnosis 2005   EF 47%, no ischemia, no infarction  . Former smoker 01/10/2017   45 pack year history, quit in 2007   . GERD (gastroesophageal reflux disease)   . History of bone density study 2006   Lt hip = -1.0, L spine =  -1.8   . Hyperlipidemia   . Hypertension     Past Surgical History:  Procedure Laterality Date  . ABDOMINAL HYSTERECTOMY  1989   one ovary remains per patient  . LEFT HEART CATH AND CORONARY ANGIOGRAPHY N/A 08/20/2019   Procedure: LEFT HEART CATH AND CORONARY ANGIOGRAPHY;  Surgeon: Belva Crome, MD;  Location: [facility];  Service: Cardiovascular;  Laterality: N/A;  . TONSILECTOMY, ADENOIDECTOMY, BILATERAL MYRINGOTOMY AND TUBES  1965    Social History: Support: Lives with her husband.  Comes to clinic with her nurse from [place].  Social History   Tobacco Use  Smoking Status Former Smoker  . Packs/day: 1.00  . Years: 45.00  . Pack years: 45.00  . Quit date: 07/27/2005  . Years since quitting: 14.0  Smokeless Tobacco Former Systems developer  . Quit date: 03/2005    Social History   Substance and Sexual Activity  Alcohol Use Yes   Comment: beer occasionally      Allergies  Allergen Reactions  . Lisinopril Itching and Cough    Medications: Asprin: Yes Statin: Yes Beta Blocker: Yes Ace Inhibitor: No Anti-Coagulation: No  Current Outpatient Medications  Medication Sig Dispense Refill  . acetaminophen (TYLENOL) 500 MG tablet Take 2 tablets (1,000 mg total) by mouth every 8 (eight) hours as needed. (Patient taking differently: Take  1,000 mg by mouth 2 (two) times daily after a meal. ) 30 tablet 2  . amLODipine (NORVASC) 10 MG tablet TAKE 1 TABLET BY MOUTH EVERY DAY (Patient taking differently: Take 10 mg by mouth daily. ) 90 tablet 1  . aspirin EC 81 MG tablet Take 81 mg by mouth daily.    . dapagliflozin propanediol (FARXIGA) 5 MG TABS tablet Take 5 mg by mouth daily. 90 tablet 0  . insulin degludec (TRESIBA FLEXTOUCH) 100 UNIT/ML SOPN FlexTouch Pen Inject 0.1 mLs (10 Units total) into the skin daily after supper. 2 pen 0  . Insulin Pen Needle (PEN NEEDLES 5/16") 30G X 8 MM MISC Pen needles used for lantus injections 100 each 3  . Lancets (ONETOUCH DELICA PLUS  [id number]) MISC 1 APPLICATION BY OTHER ROUTE DAILY. TEST BLOOD SUGAR ONCE A DAY 100 each 1  . lipase/protease/amylase (CREON) 36000 UNITS CPEP capsule Take 2 capsules with each meal three times daily  Take 1 capsule with each snack twice daily (Patient taking differently: Take 36,000-72,000 Units by mouth See admin instructions. Take 2 capsules with each meal three times daily  Take 1 capsule with each snack twice daily) 240 capsule 11  . losartan (COZAAR) 50 MG tablet TAKE 1 TABLET BY MOUTH EVERY DAY (Patient taking differently: Take 50 mg by mouth daily. ) 90 tablet 1  . lovastatin (MEVACOR) 20 MG tablet TAKE ONE TABLET BY MOUTH ONCE DAILY (Patient taking differently: Take 20 mg by mouth daily. ) 90 tablet 1  . metFORMIN (GLUCOPHAGE) 1000 MG tablet TAKE 1 TABLET BY MOUTH TWICE DAILY WITH A MEAL 60 tablet 0  . metoprolol tartrate (LOPRESSOR) 100 MG tablet TAKE 1 TABLET BY MOUTH TWICE A DAY (Patient taking differently: Take 100 mg by mouth 2 (two) times daily. ) 180 tablet 0  . ofloxacin (OCUFLOX) 0.3 % ophthalmic solution Place 1 drop into the left eye in the morning, at noon, and at bedtime. For 10 days    . omeprazole (PRILOSEC) 40 MG capsule TAKE 1 CAPSULE BY MOUTH EVERY DAY (Patient taking differently: Take 40 mg by mouth daily. ) 90 capsule 0  . ONETOUCH VERIO test strip USE 1 STRIP TO CHECK GLUCOSE ONCE DAILY 100 strip 1  . tobramycin (TOBREX) 0.3 % ophthalmic solution Place 1 drop into the left eye every morning. Prn for dry eyes     No current facility-administered medications for this visit.    (Not in a hospital admission)   Family History  Problem Relation Age of Onset  . Heart disease Father   . Cancer Sister   . Tongue cancer Sister   . Colon polyps Neg Hx   . Rectal cancer Neg Hx   . Stomach cancer Neg Hx      Review of Systems:   Review of Systems  Constitutional: Negative.   HENT: Negative.   Respiratory: Positive for shortness of breath.   Cardiovascular: Positive  for chest pain and claudication. Negative for orthopnea and leg swelling.  Gastrointestinal: Positive for abdominal pain, diarrhea, heartburn, nausea and vomiting.  Musculoskeletal: Negative.   Neurological: Negative.       Physical Exam: BP (!) 145/75   Pulse 71   Temp 97.6 F (36.4 C) (Temporal)   Resp 20   Ht 5' 3" (1.6 m)   Wt 170 lb (77.1 kg)   SpO2 92% Comment: RA  BMI 30.11 kg/m  Physical Exam Constitutional:      Appearance: Normal appearance.  HENT:  Head: Normocephalic and atraumatic.  Eyes:     Extraocular Movements: Extraocular movements intact.     Conjunctiva/sclera: Conjunctivae normal.  Cardiovascular:     Rate and Rhythm: Normal rate and regular rhythm.  Pulmonary:     Effort: Pulmonary effort is normal. No respiratory distress.  Abdominal:     General: Abdomen is flat. There is no distension.  Musculoskeletal:        General: Normal range of motion.     Cervical back: Normal range of motion.  Skin:    General: Skin is warm and dry.  Neurological:     General: No focal deficit present.     Mental Status: She is alert.       Diagnostic Studies & Laboratory data:    Left Heart Catherization: She has a completely occluded LAD which fills from left to left collaterals.  Reconstituted vessel appears appropriate for bypass.  She also has a totally occluded RCA which fills from left to right collaterals.  The PDA appears appropriate for bypass.  She has several tandem lesions in her circumflex vessel.  OM1 appears appropriate for bypass.  Echo: Echo from 2019 did not show any significant valvular disease.  She did have a stress echo that was done in June 2021 which showed reduced function.   I have independently reviewed the above radiologic studies and discussed with the patient   Recent Lab Findings: Lab Results  Component Value Date   WBC 7.1 08/07/2019   HGB 15.6 08/07/2019   HCT 47.3 (H) 08/07/2019   PLT 323 08/07/2019   GLUCOSE 121 (H)  08/07/2019   CHOL 153 01/29/2019   TRIG 216 (H) 01/29/2019   HDL 38 (L) 01/29/2019   LDLDIRECT 81 03/21/2013   LDLCALC 79 01/29/2019   ALT 11 01/29/2019   AST 15 01/29/2019   NA 141 08/07/2019   K 4.9 08/07/2019   CL 103 08/07/2019   CREATININE 0.98 08/07/2019   BUN 13 08/07/2019   CO2 20 08/07/2019   TSH 0.923 01/29/2019   HGBA1C 6.8 (H) 01/29/2019      Assessment / Plan:   79 year old female with severe three-vessel disease and CTO lesions to the LAD and RCA.  Her stress echo shows that she has an EF that is around 35%.  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  It does not appear to be porcelain but during the time of surgery may need to consider alternate arterial access.  She is agreeable to proceed with a bypass, the risks and benefits have been discussed in detail and she is tentatively scheduled for July 6.  I will obtain repeat echocardiogram, as well as new vascular studies.       Jessica Hart 08/26/2019 5:30 PM

## 2019-08-27 ENCOUNTER — Other Ambulatory Visit: Payer: Self-pay | Admitting: *Deleted

## 2019-08-27 DIAGNOSIS — I2510 Atherosclerotic heart disease of native coronary artery without angina pectoris: Secondary | ICD-10-CM

## 2019-08-28 NOTE — Progress Notes (Signed)
Your procedure is scheduled on Tuesday July 6.  Report to [facility] Main Entrance "A" at 05:30 A.M., and check in at the Admitting office.  Call this number if you have problems the morning of surgery: [phone]  Call [phone] if you have any questions prior to your surgery date Monday-Friday 8am-4pm   Remember: Do not eat or drink after midnight the night before your surgery   Take these medicines the morning of surgery with A SIP OF WATER: amLODipine (NORVASC)  lipase/protease/amylase (CREON) lovastatin (MEVACOR) metoprolol tartrate (LOPRESSOR)  omeprazole (PRILOSEC)   Eye drops :  ofloxacin (OCUFLOX)  tobramycin (TOBREX)  If needed: acetaminophen (TYLENOL)   Follow your surgeon's instructions on when to stop Aspirin.  If no instructions were given by your surgeon then you will need to call the office to get those instructions     As of today, STOP taking any Aspirin containing products, Aleve, Naproxen, Ibuprofen, Motrin, Advil, Goody's, BC's, all herbal medications, fish oil, and all vitamins.   WHAT DO I DO ABOUT MY DIABETES MEDICATION?   . THE DAY/NIGHT BEFORE SURGERY  o dapagliflozin propanediol (FARXIGA) - NONE o metFORMIN (GLUCOPHAGE) - take as usual   . THE MORNING OF SURGERY - NO ORAL DIABETIC MEDICATIONS  o dapagliflozin propanediol (FARXIGA) - NONE  o metFORMIN (GLUCOPHAGE) - NONE   HOW TO MANAGE YOUR DIABETES BEFORE AND AFTER SURGERY  Why is it important to control my blood sugar before and after surgery? . Improving blood sugar levels before and after surgery helps healing and can limit problems. . A way of improving blood sugar control is eating a healthy diet by: o  Eating less sugar and carbohydrates o  Increasing activity/exercise o  Talking with your doctor about reaching your blood sugar goals . High blood sugars (greater than 180 mg/dL) can raise your risk of infections and slow your recovery, so you will need to focus on  controlling your diabetes during the weeks before surgery. . Make sure that the doctor who takes care of your diabetes knows about your planned surgery including the date and location.  How do I manage my blood sugar before surgery? . Check your blood sugar at least 4 times a day, starting 2 days before surgery, to make sure that the level is not too high or low. . Check your blood sugar the morning of your surgery when you wake up and every 2 hours until you get to the Short Stay unit. o If your blood sugar is less than 70 mg/dL, you will need to treat for low blood sugar: - Do not take insulin. - Treat a low blood sugar (less than 70 mg/dL) with  cup of clear juice (cranberry or apple), 4 glucose tablets, OR glucose gel. - Recheck blood sugar in 15 minutes after treatment (to make sure it is greater than 70 mg/dL). If your blood sugar is not greater than 70 mg/dL on recheck, call [phone] for further instructions. . Report your blood sugar to the short stay nurse when you get to Short Stay.  . If you are admitted to the hospital after surgery: o Your blood sugar will be checked by the staff and you will probably be given insulin after surgery (instead of oral diabetes medicines) to make sure you have good blood sugar levels. o The goal for blood sugar control after surgery is 80-180 mg/dL.    The Morning of Surgery  Do not wear jewelry, make-up or nail polish.  Do  not wear lotions, powders, or perfumes, or deodorant  Do not shave 48 hours prior to surgery.    Do not bring valuables to the hospital.  [facility] is not responsible for any belongings or valuables.  If you are a smoker, DO NOT Smoke 24 hours prior to surgery  If you wear a CPAP at night please bring your mask the morning of surgery   Remember that you must have someone to transport you home after your surgery, and remain with you for 24 hours if you are discharged the same day.   Please bring cases for contacts,  glasses, hearing aids, dentures or bridgework because it cannot be worn into surgery.    Leave your suitcase in the car.  After surgery it may be brought to your room.  For patients admitted to the hospital, discharge time will be determined by your treatment team.  Patients discharged the day of surgery will not be allowed to drive home.    Special instructions:   [facility]- Preparing For Surgery  Before surgery, you can play an important role. Because skin is not sterile, your skin needs to be as free of germs as possible. You can reduce the number of germs on your skin by washing with CHG (chlorahexidine gluconate) Soap before surgery.  CHG is an antiseptic cleaner which kills germs and bonds with the skin to continue killing germs even after washing.    Oral Hygiene is also important to reduce your risk of infection.  Remember - BRUSH YOUR TEETH THE MORNING OF SURGERY WITH YOUR REGULAR TOOTHPASTE  Please do not use if you have an allergy to CHG or antibacterial soaps. If your skin becomes reddened/irritated stop using the CHG.  Do not shave (including legs and underarms) for at least 48 hours prior to first CHG shower. It is OK to shave your face.  Please follow these instructions carefully.   1. Shower the NIGHT BEFORE SURGERY and the MORNING OF SURGERY with CHG Soap.   2. If you chose to wash your hair and body, wash as usual with your normal shampoo and body-wash/soap.  3. Rinse your hair and body thoroughly to remove the shampoo and soap.  4. Apply CHG directly to the skin (ONLY FROM THE NECK DOWN) and wash gently with a scrungie or a clean washcloth.   5. Do not use on open wounds or open sores. Avoid contact with your eyes, ears, mouth and genitals (private parts). Wash Face and genitals (private parts)  with your normal soap.   6. Wash thoroughly, paying special attention to the area where your surgery will be performed.  7. Thoroughly rinse your body with warm water  from the neck down.  8. DO NOT shower/wash with your normal soap after using and rinsing off the CHG Soap.  9. Pat yourself dry with a CLEAN TOWEL.  10. Wear CLEAN PAJAMAS to bed the night before surgery  11. Place CLEAN SHEETS on your bed the night of your first shower and DO NOT SLEEP WITH PETS.  12. Wear comfortable clothes the morning of surgery.     Day of Surgery:  Please shower the morning of surgery with the CHG soap Do not apply any deodorants/lotions. Please wear clean clothes to the hospital/surgery center.   Remember to brush your teeth WITH YOUR REGULAR TOOTHPASTE.   Please read over the following fact sheets that you were given.

## 2019-08-29 ENCOUNTER — Encounter (HOSPITAL_COMMUNITY)
Admission: RE | Admit: 2019-08-29 | Discharge: 2019-08-29 | Disposition: A | Discharge status: Home / Self Care | Payer: Medicare HMO | Source: Ambulatory Visit | Attending: Thoracic Surgery (Cardiothoracic Vascular Surgery) | Admitting: Thoracic Surgery (Cardiothoracic Vascular Surgery)

## 2019-08-29 ENCOUNTER — Telehealth: Payer: Self-pay

## 2019-08-29 ENCOUNTER — Other Ambulatory Visit (HOSPITAL_COMMUNITY)
Admission: RE | Admit: 2019-08-29 | Discharge: 2019-08-29 | Disposition: A | Discharge status: Home / Self Care | Payer: Medicare HMO | Source: Ambulatory Visit | Attending: Thoracic Surgery (Cardiothoracic Vascular Surgery) | Admitting: Thoracic Surgery (Cardiothoracic Vascular Surgery)

## 2019-08-29 ENCOUNTER — Other Ambulatory Visit: Payer: Self-pay | Admitting: Family Medicine

## 2019-08-29 ENCOUNTER — Other Ambulatory Visit: Payer: Self-pay

## 2019-08-29 ENCOUNTER — Ambulatory Visit (HOSPITAL_COMMUNITY)
Admission: RE | Admit: 2019-08-29 | Discharge: 2019-08-29 | Disposition: A | Discharge status: Home / Self Care | Payer: Medicare HMO | Source: Ambulatory Visit | Attending: Thoracic Surgery (Cardiothoracic Vascular Surgery) | Admitting: Thoracic Surgery (Cardiothoracic Vascular Surgery)

## 2019-08-29 ENCOUNTER — Ambulatory Visit (HOSPITAL_BASED_OUTPATIENT_CLINIC_OR_DEPARTMENT_OTHER)
Admission: RE | Admit: 2019-08-29 | Discharge: 2019-08-29 | Disposition: A | Discharge status: Home / Self Care | Payer: Medicare HMO | Source: Ambulatory Visit | Attending: Thoracic Surgery (Cardiothoracic Vascular Surgery) | Admitting: Thoracic Surgery (Cardiothoracic Vascular Surgery)

## 2019-08-29 ENCOUNTER — Encounter (HOSPITAL_COMMUNITY): Payer: Self-pay

## 2019-08-29 DIAGNOSIS — I340 Nonrheumatic mitral (valve) insufficiency: Secondary | ICD-10-CM | POA: Diagnosis not present

## 2019-08-29 DIAGNOSIS — R918 Other nonspecific abnormal finding of lung field: Secondary | ICD-10-CM | POA: Diagnosis not present

## 2019-08-29 DIAGNOSIS — Z20822 Contact with and (suspected) exposure to covid-19: Secondary | ICD-10-CM | POA: Insufficient documentation

## 2019-08-29 DIAGNOSIS — I6523 Occlusion and stenosis of bilateral carotid arteries: Secondary | ICD-10-CM | POA: Insufficient documentation

## 2019-08-29 DIAGNOSIS — R0602 Shortness of breath: Secondary | ICD-10-CM | POA: Diagnosis not present

## 2019-08-29 DIAGNOSIS — I10 Essential (primary) hypertension: Secondary | ICD-10-CM | POA: Diagnosis not present

## 2019-08-29 DIAGNOSIS — E785 Hyperlipidemia, unspecified: Secondary | ICD-10-CM | POA: Insufficient documentation

## 2019-08-29 DIAGNOSIS — E119 Type 2 diabetes mellitus without complications: Secondary | ICD-10-CM | POA: Diagnosis not present

## 2019-08-29 DIAGNOSIS — I2510 Atherosclerotic heart disease of native coronary artery without angina pectoris: Secondary | ICD-10-CM

## 2019-08-29 DIAGNOSIS — Z01818 Encounter for other preprocedural examination: Secondary | ICD-10-CM | POA: Diagnosis not present

## 2019-08-29 HISTORY — DX: Dyspnea, unspecified: R06.00

## 2019-08-29 HISTORY — DX: Atherosclerotic heart disease of native coronary artery without angina pectoris: I25.10

## 2019-08-29 HISTORY — DX: Cardiac murmur, unspecified: R01.1

## 2019-08-29 LAB — COMPREHENSIVE METABOLIC PANEL
ALT: 19 U/L (ref 0–44)
AST: 25 U/L (ref 15–41)
Albumin: 4.5 g/dL (ref 3.5–5.0)
Alkaline Phosphatase: 84 U/L (ref 38–126)
Anion gap: 11 (ref 5–15)
BUN: 14 mg/dL (ref 8–23)
CO2: 18 mmol/L — ABNORMAL LOW (ref 22–32)
Calcium: 9.8 mg/dL (ref 8.9–10.3)
Chloride: 109 mmol/L (ref 98–111)
Creatinine, Ser: 0.93 mg/dL (ref 0.44–1.00)
GFR calc Af Amer: >60 mL/min (ref >60)
GFR calc non Af Amer: 59 mL/min — ABNORMAL LOW (ref >60)
Glucose, Bld: 111 mg/dL — ABNORMAL HIGH (ref 70–99)
Potassium: 4.6 mmol/L (ref 3.5–5.1)
Sodium: 138 mmol/L (ref 135–145)
Total Bilirubin: 0.8 mg/dL (ref 0.3–1.2)
Total Protein: 8 g/dL (ref 6.5–8.1)

## 2019-08-29 LAB — URINALYSIS, ROUTINE W REFLEX MICROSCOPIC
Bilirubin Urine: NEGATIVE
Glucose, UA: >=500 mg/dL — AB
Hgb urine dipstick: NEGATIVE
Ketones, ur: NEGATIVE mg/dL
Leukocytes,Ua: NEGATIVE
Nitrite: POSITIVE — AB
Protein, ur: NEGATIVE mg/dL
Specific Gravity, Urine: 1.011 (ref 1.005–1.030)
pH: 5 (ref 5.0–8.0)

## 2019-08-29 LAB — CBC
HCT: 45.7 % (ref 36.0–46.0)
Hemoglobin: 15.1 g/dL — ABNORMAL HIGH (ref 12.0–15.0)
MCH: 28.8 pg (ref 26.0–34.0)
MCHC: 33 g/dL (ref 30.0–36.0)
MCV: 87 fL (ref 80.0–100.0)
Platelets: 355 10*3/uL (ref 150–400)
RBC: 5.25 MIL/uL — ABNORMAL HIGH (ref 3.87–5.11)
RDW: 13.5 % (ref 11.5–15.5)
WBC: 7.4 10*3/uL (ref 4.0–10.5)
nRBC: 0 % (ref 0.0–0.2)

## 2019-08-29 LAB — SARS CORONAVIRUS 2 (TAT 6-24 HRS): SARS Coronavirus 2: NEGATIVE

## 2019-08-29 LAB — PROTIME-INR
INR: 1 (ref 0.8–1.2)
Prothrombin Time: 12.8 seconds (ref 11.4–15.2)

## 2019-08-29 LAB — BLOOD GAS, ARTERIAL
Acid-base deficit: 4.5 mmol/L — ABNORMAL HIGH (ref 0.0–2.0)
Bicarbonate: 19.5 mmol/L — ABNORMAL LOW (ref 20.0–28.0)
Drawn by: 421801
FIO2: 21
O2 Saturation: 95.9 %
Patient temperature: 37
pCO2 arterial: 32.5 mmHg (ref 32.0–48.0)
pH, Arterial: 7.395 (ref 7.350–7.450)
pO2, Arterial: 82.8 mmHg — ABNORMAL LOW (ref 83.0–108.0)

## 2019-08-29 LAB — HEMOGLOBIN A1C
Hgb A1c MFr Bld: 6.7 % — ABNORMAL HIGH (ref 4.8–5.6)
Mean Plasma Glucose: 145.59 mg/dL

## 2019-08-29 LAB — APTT: aPTT: 36 seconds (ref 24–36)

## 2019-08-29 LAB — ECHOCARDIOGRAM COMPLETE
Height: 63 in
Weight: 2660.8 oz

## 2019-08-29 LAB — SURGICAL PCR SCREEN
MRSA, PCR: NEGATIVE
Staphylococcus aureus: NEGATIVE

## 2019-08-29 LAB — TYPE AND SCREEN
ABO/RH(D): O POS
Antibody Screen: NEGATIVE

## 2019-08-29 LAB — GLUCOSE, CAPILLARY: Glucose-Capillary: 159 mg/dL — ABNORMAL HIGH (ref 70–99)

## 2019-08-29 MED ORDER — TRANEXAMIC ACID (OHS) PUMP PRIME SOLUTION
2.0000 mg/kg | INTRAVENOUS | Status: DC
  Filled 2019-08-29: qty 1.54

## 2019-08-29 MED ORDER — MILRINONE LACTATE IN DEXTROSE 20-5 MG/100ML-% IV SOLN
0.3000 ug/kg/min | INTRAVENOUS | Status: AC
  Administered 2019-09-02: .25 ug/kg/min via INTRAVENOUS
  Filled 2019-08-29: qty 100

## 2019-08-29 MED ORDER — MANNITOL 20 % IV SOLN
INTRAVENOUS | Status: DC
  Filled 2019-08-29: qty 13

## 2019-08-29 MED ORDER — NITROGLYCERIN IN D5W 200-5 MCG/ML-% IV SOLN
2.0000 ug/min | INTRAVENOUS | Status: DC
  Filled 2019-08-29: qty 250

## 2019-08-29 MED ORDER — SODIUM CHLORIDE 0.9 % IV SOLN
750.0000 mg | INTRAVENOUS | Status: AC
  Administered 2019-09-02: 750 mg via INTRAVENOUS
  Filled 2019-08-29: qty 750

## 2019-08-29 MED ORDER — DEXMEDETOMIDINE HCL IN NACL 400 MCG/100ML IV SOLN
0.1000 ug/kg/h | INTRAVENOUS | Status: AC
  Administered 2019-09-02: .4 ug/kg/h via INTRAVENOUS
  Filled 2019-08-29: qty 100

## 2019-08-29 MED ORDER — POTASSIUM CHLORIDE 2 MEQ/ML IV SOLN
80.0000 meq | INTRAVENOUS | Status: DC
  Filled 2019-08-29: qty 40

## 2019-08-29 MED ORDER — PHENYLEPHRINE HCL-NACL 20-0.9 MG/250ML-% IV SOLN
30.0000 ug/min | INTRAVENOUS | Status: AC
  Administered 2019-09-02: 20 ug/min via INTRAVENOUS
  Filled 2019-08-29: qty 250

## 2019-08-29 MED ORDER — EPINEPHRINE HCL 5 MG/250ML IV SOLN IN NS
0.0000 ug/min | INTRAVENOUS | Status: DC
  Filled 2019-08-29: qty 250

## 2019-08-29 MED ORDER — PLASMA-LYTE 148 IV SOLN
INTRAVENOUS | Status: DC
  Filled 2019-08-29: qty 2.5

## 2019-08-29 MED ORDER — INSULIN REGULAR(HUMAN) IN NACL 100-0.9 UT/100ML-% IV SOLN
INTRAVENOUS | Status: AC
  Administered 2019-09-02: 1.2 [IU]/h via INTRAVENOUS
  Filled 2019-08-29: qty 100

## 2019-08-29 MED ORDER — CIPROFLOXACIN HCL 250 MG PO TABS: 500.0000 mg | ORAL_TABLET | Freq: Two times a day (BID) | ORAL | 0 refills | Status: AC

## 2019-08-29 MED ORDER — TRANEXAMIC ACID (OHS) BOLUS VIA INFUSION
15.0000 mg/kg | INTRAVENOUS | Status: AC
  Administered 2019-09-02: 1156.5 mg via INTRAVENOUS
  Filled 2019-08-29: qty 1157

## 2019-08-29 MED ORDER — VANCOMYCIN HCL 1250 MG/250ML IV SOLN
1250.0000 mg | INTRAVENOUS | Status: AC
  Administered 2019-09-02: 1250 mg via INTRAVENOUS
  Filled 2019-08-29: qty 250

## 2019-08-29 MED ORDER — SODIUM CHLORIDE 0.9 % IV SOLN
INTRAVENOUS | Status: DC
  Filled 2019-08-29: qty 30

## 2019-08-29 MED ORDER — NOREPINEPHRINE 4 MG/250ML-% IV SOLN
0.0000 ug/min | INTRAVENOUS | Status: AC
  Administered 2019-09-02: 2 ug/min via INTRAVENOUS
  Filled 2019-08-29: qty 250

## 2019-08-29 MED ORDER — TRANEXAMIC ACID 1000 MG/10ML IV SOLN
1.5000 mg/kg/h | INTRAVENOUS | Status: AC
  Administered 2019-09-02: 1.5 mg/kg/h via INTRAVENOUS
  Filled 2019-08-29: qty 25

## 2019-08-29 MED ORDER — SODIUM CHLORIDE 0.9 % IV SOLN
1.5000 g | INTRAVENOUS | Status: AC
  Administered 2019-09-02: 1.5 g via INTRAVENOUS
  Filled 2019-08-29: qty 1.5

## 2019-08-29 NOTE — Telephone Encounter (Signed)
Received VM from [place], [place] at [facility] stating patient's UA was abnormal.  Per Dr. Kipp Brood, patient is to take 595m Cipro BID x3 days up until surgery 09/02/19.  Called patient and advised of medication and to pick up from preferred pharmacy, [facility] on [address].  Patient acknowledged receipt.

## 2019-08-29 NOTE — Progress Notes (Signed)
  Echocardiogram 2D Echocardiogram has been performed.  Johny Chess 08/29/2019, 2:39 PM

## 2019-08-29 NOTE — Progress Notes (Signed)
Vascular US Pre-CABG study completed.   See Cv Proc for preliminary results.   Jessica Hart

## 2019-08-29 NOTE — Progress Notes (Signed)
PCP - Vickie L. Raenette Rover, NP-C Cardiologist - Gayatri A. Margaretann Loveless, MD  PPM/ICD - Denies  Chest x-ray - 08/29/19 EKG - 08/29/19 Stress Test - 08/06/19 ECHO - 02/26/18; scheduled today, 08/29/19 @ 1415, patient aware Cardiac Cath - 08/20/19 Doppler Studies- scheduled today, 08/29/19 @ 1500, patient aware  Sleep Study - Denies  Fasting Blood Sugar: 119/125 Checks Blood Sugar x1 daily  Blood Thinner Instructions: N/A Aspirin Instructions: Patient to contact Dr. Abran Duke office.  ERAS Protcol - N/A PRE-SURGERY Ensure or G2- N/A  COVID TEST- 08/29/19   Anesthesia review: Yes, cardiac hx  Patient denies shortness of breath, fever, cough and chest pain at PAT appointment   All instructions explained to the patient, with a verbal understanding of the material. Patient agrees to go over the instructions while at home for a better understanding. Patient also instructed to self quarantine after being tested for COVID-19. The opportunity to ask questions was provided.

## 2019-08-29 NOTE — Progress Notes (Addendum)
Abnormal U/A called in to Dr. Abran Duke office-- Left VM for Charlena Cross, RN. Also [employer] Dr. Kipp Brood concerning abnormal U/A.

## 2019-08-31 ENCOUNTER — Encounter (HOSPITAL_COMMUNITY): Payer: Self-pay | Admitting: Thoracic Surgery (Cardiothoracic Vascular Surgery)

## 2019-09-01 NOTE — Anesthesia Preprocedure Evaluation (Addendum)
Anesthesia Evaluation  Patient identified by MRN, date of birth, ID band Patient awake    Reviewed: Allergy & Precautions, NPO status , Patient's Chart, lab work & pertinent test results  History of Anesthesia Complications Negative for: history of anesthetic complications  Airway Mallampati: II  TM Distance: >3 FB Neck ROM: Full    Dental  (+) Dental Advisory Given   Pulmonary former smoker,    Pulmonary exam normal        Cardiovascular hypertension, Pt. on medications and Pt. on home beta blockers + CAD and +CHF   Rhythm:Regular Rate:Normal + Systolic murmurs  '21 Carotid US - 1-39% b/l ICAS  '21 TTE - Hypokinesis of the inferior, inferoseptal, anterioseptal, apical anterior, and apical myocardium. EF 40 to 45%. LV internal cavity was normal in size. No LVH. Grade I diastolic dysfunction (impaired relaxation). Normal RV, LA, and RA. Mild MR.   '21 Cath - Severe three-vessel coronary artery obstructive disease with severe three-vessel calcification. Functionally occluded proximal to mid LAD supplied by left to left collaterals. Diffusely diseased circumflex with proximal 80% stenosis and distal 90% stenosis before a small third obtuse marginal. Totally occluded proximal RCA filled by left to right collaterals. Left main is widely patent Ischemic cardiomyopathy with severe inferobasal hypokinesis and apical wall motion abnormality as well.  EF 35 to 40%.  Normal LVEDP.    Neuro/Psych negative neurological ROS  negative psych ROS   GI/Hepatic Neg liver ROS, GERD  Medicated and Controlled,  Endo/Other  diabetes, Type 2, Insulin Dependent, Oral Hypoglycemic Agents  Renal/GU negative Renal ROS     Musculoskeletal  (+) Arthritis , Osteoarthritis,    Abdominal   Peds  Hematology negative hematology ROS (+)   Anesthesia Other Findings Covid test negative Hx HSV  Reproductive/Obstetrics                             Anesthesia Physical Anesthesia Plan  ASA: IV  Anesthesia Plan: General   Post-op Pain Management:    Induction: Intravenous  PONV Risk Score and Plan: 3 and Treatment may vary due to age or medical condition  Airway Management Planned: Oral ETT  Additional Equipment: Arterial line, CVP, PA Cath, TEE and Ultrasound Guidance Line Placement  Intra-op Plan:   Post-operative Plan: Post-operative intubation/ventilation  Informed Consent: I have reviewed the patients History and Physical, chart, labs and discussed the procedure including the risks, benefits and alternatives for the proposed anesthesia with the patient or authorized representative who has indicated his/her understanding and acceptance.     Dental advisory given  Plan Discussed with: CRNA and Anesthesiologist  Anesthesia Plan Comments: (Possible right axillary artery cannulation, plan to place radial arterial line on left arm)       Anesthesia Quick Evaluation

## 2019-09-02 ENCOUNTER — Inpatient Hospital Stay (HOSPITAL_COMMUNITY): Payer: Medicare HMO | Admitting: Certified Registered Nurse Anesthetist

## 2019-09-02 ENCOUNTER — Inpatient Hospital Stay (HOSPITAL_COMMUNITY): Payer: Medicare HMO | Admitting: Vascular Surgery

## 2019-09-02 ENCOUNTER — Inpatient Hospital Stay (HOSPITAL_COMMUNITY): Payer: Medicare HMO

## 2019-09-02 ENCOUNTER — Encounter (HOSPITAL_COMMUNITY): Payer: Self-pay | Admitting: Thoracic Surgery (Cardiothoracic Vascular Surgery)

## 2019-09-02 ENCOUNTER — Other Ambulatory Visit: Payer: Self-pay

## 2019-09-02 ENCOUNTER — Inpatient Hospital Stay (HOSPITAL_COMMUNITY)
Admission: RE | Disposition: A | Discharge status: Home / Self Care | Payer: Self-pay | Source: Home / Self Care | Attending: Thoracic Surgery (Cardiothoracic Vascular Surgery)

## 2019-09-02 ENCOUNTER — Inpatient Hospital Stay (HOSPITAL_COMMUNITY)
Admission: RE | Admit: 2019-09-02 | Discharge: 2019-09-07 | DRG: 236 | Disposition: A | Discharge status: Home / Self Care | Payer: Medicare HMO | Attending: Thoracic Surgery (Cardiothoracic Vascular Surgery) | Admitting: Thoracic Surgery (Cardiothoracic Vascular Surgery)

## 2019-09-02 DIAGNOSIS — M1990 Unspecified osteoarthritis, unspecified site: Secondary | ICD-10-CM | POA: Diagnosis present

## 2019-09-02 DIAGNOSIS — K219 Gastro-esophageal reflux disease without esophagitis: Secondary | ICD-10-CM | POA: Diagnosis not present

## 2019-09-02 DIAGNOSIS — E1151 Type 2 diabetes mellitus with diabetic peripheral angiopathy without gangrene: Secondary | ICD-10-CM | POA: Diagnosis present

## 2019-09-02 DIAGNOSIS — E785 Hyperlipidemia, unspecified: Secondary | ICD-10-CM | POA: Diagnosis present

## 2019-09-02 DIAGNOSIS — Z87891 Personal history of nicotine dependence: Secondary | ICD-10-CM

## 2019-09-02 DIAGNOSIS — K5790 Diverticulosis of intestine, part unspecified, without perforation or abscess without bleeding: Secondary | ICD-10-CM | POA: Diagnosis not present

## 2019-09-02 DIAGNOSIS — E8770 Fluid overload, unspecified: Secondary | ICD-10-CM | POA: Diagnosis not present

## 2019-09-02 DIAGNOSIS — K8689 Other specified diseases of pancreas: Secondary | ICD-10-CM | POA: Diagnosis not present

## 2019-09-02 DIAGNOSIS — Z20822 Contact with and (suspected) exposure to covid-19: Secondary | ICD-10-CM | POA: Diagnosis present

## 2019-09-02 DIAGNOSIS — Z7982 Long term (current) use of aspirin: Secondary | ICD-10-CM | POA: Diagnosis not present

## 2019-09-02 DIAGNOSIS — Z79899 Other long term (current) drug therapy: Secondary | ICD-10-CM | POA: Diagnosis not present

## 2019-09-02 DIAGNOSIS — J811 Chronic pulmonary edema: Secondary | ICD-10-CM | POA: Diagnosis not present

## 2019-09-02 DIAGNOSIS — Z888 Allergy status to other drugs, medicaments and biological substances status: Secondary | ICD-10-CM

## 2019-09-02 DIAGNOSIS — E119 Type 2 diabetes mellitus without complications: Secondary | ICD-10-CM | POA: Diagnosis not present

## 2019-09-02 DIAGNOSIS — Z794 Long term (current) use of insulin: Secondary | ICD-10-CM | POA: Diagnosis not present

## 2019-09-02 DIAGNOSIS — I2510 Atherosclerotic heart disease of native coronary artery without angina pectoris: Secondary | ICD-10-CM | POA: Diagnosis present

## 2019-09-02 DIAGNOSIS — J90 Pleural effusion, not elsewhere classified: Secondary | ICD-10-CM | POA: Diagnosis not present

## 2019-09-02 DIAGNOSIS — J939 Pneumothorax, unspecified: Secondary | ICD-10-CM

## 2019-09-02 DIAGNOSIS — D62 Acute posthemorrhagic anemia: Secondary | ICD-10-CM | POA: Diagnosis not present

## 2019-09-02 DIAGNOSIS — I509 Heart failure, unspecified: Secondary | ICD-10-CM | POA: Diagnosis not present

## 2019-09-02 DIAGNOSIS — R000 Tachycardia, unspecified: Secondary | ICD-10-CM | POA: Diagnosis not present

## 2019-09-02 DIAGNOSIS — I517 Cardiomegaly: Secondary | ICD-10-CM | POA: Diagnosis not present

## 2019-09-02 DIAGNOSIS — I119 Hypertensive heart disease without heart failure: Secondary | ICD-10-CM | POA: Diagnosis present

## 2019-09-02 DIAGNOSIS — I25110 Atherosclerotic heart disease of native coronary artery with unstable angina pectoris: Principal | ICD-10-CM | POA: Diagnosis present

## 2019-09-02 DIAGNOSIS — Z808 Family history of malignant neoplasm of other organs or systems: Secondary | ICD-10-CM

## 2019-09-02 DIAGNOSIS — J9811 Atelectasis: Secondary | ICD-10-CM | POA: Diagnosis not present

## 2019-09-02 DIAGNOSIS — K5900 Constipation, unspecified: Secondary | ICD-10-CM | POA: Diagnosis not present

## 2019-09-02 DIAGNOSIS — Z8249 Family history of ischemic heart disease and other diseases of the circulatory system: Secondary | ICD-10-CM | POA: Diagnosis not present

## 2019-09-02 DIAGNOSIS — I110 Hypertensive heart disease with heart failure: Secondary | ICD-10-CM | POA: Diagnosis not present

## 2019-09-02 DIAGNOSIS — R0902 Hypoxemia: Secondary | ICD-10-CM | POA: Diagnosis not present

## 2019-09-02 DIAGNOSIS — Z951 Presence of aortocoronary bypass graft: Secondary | ICD-10-CM

## 2019-09-02 HISTORY — PX: CORONARY ARTERY BYPASS GRAFT: SHX141

## 2019-09-02 HISTORY — PX: TEE WITHOUT CARDIOVERSION: SHX5443

## 2019-09-02 HISTORY — PX: ENDOVEIN HARVEST OF GREATER SAPHENOUS VEIN: SHX5059

## 2019-09-02 LAB — CBC
HCT: 34.2 % — ABNORMAL LOW (ref 36.0–46.0)
HCT: 36.2 % (ref 36.0–46.0)
Hemoglobin: 11 g/dL — ABNORMAL LOW (ref 12.0–15.0)
Hemoglobin: 12 g/dL (ref 12.0–15.0)
MCH: 28.6 pg (ref 26.0–34.0)
MCH: 28.8 pg (ref 26.0–34.0)
MCHC: 32.2 g/dL (ref 30.0–36.0)
MCHC: 33.1 g/dL (ref 30.0–36.0)
MCV: 88.8 fL (ref 80.0–100.0)
MCV: 86.8 fL (ref 80.0–100.0)
Platelets: 207 10*3/uL (ref 150–400)
Platelets: 249 10*3/uL (ref 150–400)
RBC: 3.85 MIL/uL — ABNORMAL LOW (ref 3.87–5.11)
RBC: 4.17 MIL/uL (ref 3.87–5.11)
RDW: 13.4 % (ref 11.5–15.5)
RDW: 13.3 % (ref 11.5–15.5)
WBC: 14.6 10*3/uL — ABNORMAL HIGH (ref 4.0–10.5)
WBC: 11.8 10*3/uL — ABNORMAL HIGH (ref 4.0–10.5)
nRBC: 0 % (ref 0.0–0.2)
nRBC: 0 % (ref 0.0–0.2)

## 2019-09-02 LAB — POCT I-STAT 7, (LYTES, BLD GAS, ICA,H+H)
Acid-base deficit: 7 mmol/L — ABNORMAL HIGH (ref 0.0–2.0)
Acid-base deficit: 5 mmol/L — ABNORMAL HIGH (ref 0.0–2.0)
Acid-base deficit: 4 mmol/L — ABNORMAL HIGH (ref 0.0–2.0)
Acid-base deficit: 1 mmol/L (ref 0.0–2.0)
Acid-base deficit: 4 mmol/L — ABNORMAL HIGH (ref 0.0–2.0)
Acid-base deficit: 4 mmol/L — ABNORMAL HIGH (ref 0.0–2.0)
Acid-base deficit: 4 mmol/L — ABNORMAL HIGH (ref 0.0–2.0)
Bicarbonate: 18.6 mmol/L — ABNORMAL LOW (ref 20.0–28.0)
Bicarbonate: 21 mmol/L (ref 20.0–28.0)
Bicarbonate: 21.3 mmol/L (ref 20.0–28.0)
Bicarbonate: 23.2 mmol/L (ref 20.0–28.0)
Bicarbonate: 22.6 mmol/L (ref 20.0–28.0)
Bicarbonate: 19.8 mmol/L — ABNORMAL LOW (ref 20.0–28.0)
Bicarbonate: 20.3 mmol/L (ref 20.0–28.0)
Calcium, Ion: 1.29 mmol/L (ref 1.15–1.40)
Calcium, Ion: 1 mmol/L — ABNORMAL LOW (ref 1.15–1.40)
Calcium, Ion: 1.1 mmol/L — ABNORMAL LOW (ref 1.15–1.40)
Calcium, Ion: 1.36 mmol/L (ref 1.15–1.40)
Calcium, Ion: 1.26 mmol/L (ref 1.15–1.40)
Calcium, Ion: 1.23 mmol/L (ref 1.15–1.40)
Calcium, Ion: 1.18 mmol/L (ref 1.15–1.40)
HCT: 40 % (ref 36.0–46.0)
HCT: 28 % — ABNORMAL LOW (ref 36.0–46.0)
HCT: 30 % — ABNORMAL LOW (ref 36.0–46.0)
HCT: 33 % — ABNORMAL LOW (ref 36.0–46.0)
HCT: 32 % — ABNORMAL LOW (ref 36.0–46.0)
HCT: 35 % — ABNORMAL LOW (ref 36.0–46.0)
HCT: 36 % (ref 36.0–46.0)
Hemoglobin: 13.6 g/dL (ref 12.0–15.0)
Hemoglobin: 9.5 g/dL — ABNORMAL LOW (ref 12.0–15.0)
Hemoglobin: 10.2 g/dL — ABNORMAL LOW (ref 12.0–15.0)
Hemoglobin: 11.2 g/dL — ABNORMAL LOW (ref 12.0–15.0)
Hemoglobin: 10.9 g/dL — ABNORMAL LOW (ref 12.0–15.0)
Hemoglobin: 11.9 g/dL — ABNORMAL LOW (ref 12.0–15.0)
Hemoglobin: 12.2 g/dL (ref 12.0–15.0)
O2 Saturation: 100 %
O2 Saturation: 100 %
O2 Saturation: 100 %
O2 Saturation: 100 %
O2 Saturation: 90 %
O2 Saturation: 93 %
O2 Saturation: 93 %
Patient temperature: 35.1
Patient temperature: 36.5
Patient temperature: 37.2
Potassium: 3.6 mmol/L (ref 3.5–5.1)
Potassium: 4 mmol/L (ref 3.5–5.1)
Potassium: 4.2 mmol/L (ref 3.5–5.1)
Potassium: 4 mmol/L (ref 3.5–5.1)
Potassium: 3.7 mmol/L (ref 3.5–5.1)
Potassium: 4.5 mmol/L (ref 3.5–5.1)
Potassium: 4.2 mmol/L (ref 3.5–5.1)
Sodium: 141 mmol/L (ref 135–145)
Sodium: 141 mmol/L (ref 135–145)
Sodium: 140 mmol/L (ref 135–145)
Sodium: 141 mmol/L (ref 135–145)
Sodium: 142 mmol/L (ref 135–145)
Sodium: 141 mmol/L (ref 135–145)
Sodium: 141 mmol/L (ref 135–145)
TCO2: 20 mmol/L — ABNORMAL LOW (ref 22–32)
TCO2: 22 mmol/L (ref 22–32)
TCO2: 22 mmol/L (ref 22–32)
TCO2: 24 mmol/L (ref 22–32)
TCO2: 24 mmol/L (ref 22–32)
TCO2: 21 mmol/L — ABNORMAL LOW (ref 22–32)
TCO2: 21 mmol/L — ABNORMAL LOW (ref 22–32)
pCO2 arterial: 37.9 mmHg (ref 32.0–48.0)
pCO2 arterial: 41.6 mmHg (ref 32.0–48.0)
pCO2 arterial: 37.6 mmHg (ref 32.0–48.0)
pCO2 arterial: 37 mmHg (ref 32.0–48.0)
pCO2 arterial: 42.3 mmHg (ref 32.0–48.0)
pCO2 arterial: 32 mmHg (ref 32.0–48.0)
pCO2 arterial: 34.9 mmHg (ref 32.0–48.0)
pH, Arterial: 7.3 — ABNORMAL LOW (ref 7.350–7.450)
pH, Arterial: 7.311 — ABNORMAL LOW (ref 7.350–7.450)
pH, Arterial: 7.362 (ref 7.350–7.450)
pH, Arterial: 7.405 (ref 7.350–7.450)
pH, Arterial: 7.326 — ABNORMAL LOW (ref 7.350–7.450)
pH, Arterial: 7.397 (ref 7.350–7.450)
pH, Arterial: 7.374 (ref 7.350–7.450)
pO2, Arterial: 330 mmHg — ABNORMAL HIGH (ref 83.0–108.0)
pO2, Arterial: 350 mmHg — ABNORMAL HIGH (ref 83.0–108.0)
pO2, Arterial: 291 mmHg — ABNORMAL HIGH (ref 83.0–108.0)
pO2, Arterial: 259 mmHg — ABNORMAL HIGH (ref 83.0–108.0)
pO2, Arterial: 58 mmHg — ABNORMAL LOW (ref 83.0–108.0)
pO2, Arterial: 63 mmHg — ABNORMAL LOW (ref 83.0–108.0)
pO2, Arterial: 70 mmHg — ABNORMAL LOW (ref 83.0–108.0)

## 2019-09-02 LAB — POCT I-STAT, CHEM 8
BUN: 16 mg/dL (ref 8–23)
BUN: 14 mg/dL (ref 8–23)
BUN: 14 mg/dL (ref 8–23)
Calcium, Ion: 1.3 mmol/L (ref 1.15–1.40)
Calcium, Ion: 1.09 mmol/L — ABNORMAL LOW (ref 1.15–1.40)
Calcium, Ion: 1.35 mmol/L (ref 1.15–1.40)
Chloride: 107 mmol/L (ref 98–111)
Chloride: 103 mmol/L (ref 98–111)
Chloride: 104 mmol/L (ref 98–111)
Creatinine, Ser: 0.7 mg/dL (ref 0.44–1.00)
Creatinine, Ser: 0.6 mg/dL (ref 0.44–1.00)
Creatinine, Ser: 0.6 mg/dL (ref 0.44–1.00)
Glucose, Bld: 103 mg/dL — ABNORMAL HIGH (ref 70–99)
Glucose, Bld: 113 mg/dL — ABNORMAL HIGH (ref 70–99)
Glucose, Bld: 118 mg/dL — ABNORMAL HIGH (ref 70–99)
HCT: 39 % (ref 36.0–46.0)
HCT: 27 % — ABNORMAL LOW (ref 36.0–46.0)
HCT: 28 % — ABNORMAL LOW (ref 36.0–46.0)
Hemoglobin: 13.3 g/dL (ref 12.0–15.0)
Hemoglobin: 9.2 g/dL — ABNORMAL LOW (ref 12.0–15.0)
Hemoglobin: 9.5 g/dL — ABNORMAL LOW (ref 12.0–15.0)
Potassium: 3.7 mmol/L (ref 3.5–5.1)
Potassium: 4.3 mmol/L (ref 3.5–5.1)
Potassium: 4 mmol/L (ref 3.5–5.1)
Sodium: 141 mmol/L (ref 135–145)
Sodium: 138 mmol/L (ref 135–145)
Sodium: 141 mmol/L (ref 135–145)
TCO2: 21 mmol/L — ABNORMAL LOW (ref 22–32)
TCO2: 23 mmol/L (ref 22–32)
TCO2: 27 mmol/L (ref 22–32)

## 2019-09-02 LAB — GLUCOSE, CAPILLARY
Glucose-Capillary: 148 mg/dL — ABNORMAL HIGH (ref 70–99)
Glucose-Capillary: 114 mg/dL — ABNORMAL HIGH (ref 70–99)
Glucose-Capillary: 136 mg/dL — ABNORMAL HIGH (ref 70–99)
Glucose-Capillary: 128 mg/dL — ABNORMAL HIGH (ref 70–99)
Glucose-Capillary: 124 mg/dL — ABNORMAL HIGH (ref 70–99)
Glucose-Capillary: 111 mg/dL — ABNORMAL HIGH (ref 70–99)
Glucose-Capillary: 124 mg/dL — ABNORMAL HIGH (ref 70–99)
Glucose-Capillary: 131 mg/dL — ABNORMAL HIGH (ref 70–99)
Glucose-Capillary: 146 mg/dL — ABNORMAL HIGH (ref 70–99)
Glucose-Capillary: 137 mg/dL — ABNORMAL HIGH (ref 70–99)
Glucose-Capillary: 137 mg/dL — ABNORMAL HIGH (ref 70–99)

## 2019-09-02 LAB — APTT: aPTT: 33 seconds (ref 24–36)

## 2019-09-02 LAB — BASIC METABOLIC PANEL
Anion gap: 10 (ref 5–15)
BUN: 11 mg/dL (ref 8–23)
CO2: 18 mmol/L — ABNORMAL LOW (ref 22–32)
Calcium: 8.4 mg/dL — ABNORMAL LOW (ref 8.9–10.3)
Chloride: 109 mmol/L (ref 98–111)
Creatinine, Ser: 0.73 mg/dL (ref 0.44–1.00)
GFR calc Af Amer: >60 mL/min (ref >60)
GFR calc non Af Amer: >60 mL/min (ref >60)
Glucose, Bld: 127 mg/dL — ABNORMAL HIGH (ref 70–99)
Potassium: 4.2 mmol/L (ref 3.5–5.1)
Sodium: 137 mmol/L (ref 135–145)

## 2019-09-02 LAB — ECHO INTRAOPERATIVE TEE
Height: 63 in
Weight: 2659.63 oz

## 2019-09-02 LAB — PROTIME-INR
INR: 1.4 — ABNORMAL HIGH (ref 0.8–1.2)
Prothrombin Time: 16.4 seconds — ABNORMAL HIGH (ref 11.4–15.2)

## 2019-09-02 LAB — MAGNESIUM: Magnesium: 2.7 mg/dL — ABNORMAL HIGH (ref 1.7–2.4)

## 2019-09-02 LAB — HEMOGLOBIN AND HEMATOCRIT, BLOOD
HCT: 29.3 % — ABNORMAL LOW (ref 36.0–46.0)
Hemoglobin: 9.5 g/dL — ABNORMAL LOW (ref 12.0–15.0)

## 2019-09-02 LAB — ABO/RH: ABO/RH(D): O POS

## 2019-09-02 LAB — PLATELET COUNT: Platelets: 184 10*3/uL (ref 150–400)

## 2019-09-02 SURGERY — CORONARY ARTERY BYPASS GRAFTING (CABG)
Anesthesia: General | Site: Leg Lower | Laterality: Right

## 2019-09-02 MED ORDER — PROPOFOL 10 MG/ML IV BOLUS
INTRAVENOUS | Status: AC
  Filled 2019-09-02: qty 20

## 2019-09-02 MED ORDER — SODIUM CHLORIDE (PF) 0.9 % IJ SOLN
INTRAMUSCULAR | Status: AC
  Filled 2019-09-02: qty 20

## 2019-09-02 MED ORDER — LACTATED RINGERS IV SOLN: INTRAVENOUS | Status: DC

## 2019-09-02 MED ORDER — TOBRAMYCIN 0.3 % OP SOLN
1.0000 [drp] | Freq: Every morning | OPHTHALMIC | Status: DC
  Administered 2019-09-03 – 2019-09-07 (×5): 1 [drp] via OPHTHALMIC
  Filled 2019-09-02: qty 5

## 2019-09-02 MED ORDER — PANTOPRAZOLE SODIUM 40 MG PO TBEC
40.0000 mg | DELAYED_RELEASE_TABLET | Freq: Every day | ORAL | Status: DC
  Administered 2019-09-04 – 2019-09-07 (×4): 40 mg via ORAL
  Filled 2019-09-02 (×4): qty 1

## 2019-09-02 MED ORDER — FENTANYL CITRATE (PF) 250 MCG/5ML IJ SOLN
INTRAMUSCULAR | Status: DC | PRN
  Administered 2019-09-02: 100 ug via INTRAVENOUS
  Administered 2019-09-02: 150 ug via INTRAVENOUS
  Administered 2019-09-02: 50 ug via INTRAVENOUS
  Administered 2019-09-02 (×4): 150 ug via INTRAVENOUS

## 2019-09-02 MED ORDER — SODIUM CHLORIDE 0.9% FLUSH
3.0000 mL | Freq: Two times a day (BID) | INTRAVENOUS | Status: DC
  Administered 2019-09-03 – 2019-09-05 (×3): 3 mL via INTRAVENOUS

## 2019-09-02 MED ORDER — MIDAZOLAM HCL 5 MG/5ML IJ SOLN
INTRAMUSCULAR | Status: DC | PRN
  Administered 2019-09-02: 1 mg via INTRAVENOUS
  Administered 2019-09-02 (×4): 2 mg via INTRAVENOUS
  Administered 2019-09-02: 1 mg via INTRAVENOUS

## 2019-09-02 MED ORDER — SODIUM CHLORIDE (PF) 0.9 % IJ SOLN: OROMUCOSAL | Status: DC | PRN

## 2019-09-02 MED ORDER — FENTANYL CITRATE (PF) 250 MCG/5ML IJ SOLN
INTRAMUSCULAR | Status: AC
  Filled 2019-09-02: qty 25

## 2019-09-02 MED ORDER — LACTATED RINGERS IV SOLN: 500.0000 mL | Freq: Once | INTRAVENOUS | Status: DC | PRN

## 2019-09-02 MED ORDER — ACETAMINOPHEN 650 MG RE SUPP
650.0000 mg | Freq: Once | RECTAL | Status: AC
  Administered 2019-09-02: 650 mg via RECTAL

## 2019-09-02 MED ORDER — PANCRELIPASE (LIP-PROT-AMYL) 12000-38000 UNITS PO CPEP
36000.0000 [IU] | ORAL_CAPSULE | ORAL | Status: DC
  Administered 2019-09-03 – 2019-09-06 (×5): 36000 [IU] via ORAL
  Filled 2019-09-02: qty 3
  Filled 2019-09-02: qty 1
  Filled 2019-09-02: qty 3
  Filled 2019-09-02: qty 1
  Filled 2019-09-02: qty 3
  Filled 2019-09-02 (×4): qty 1

## 2019-09-02 MED ORDER — ACETAMINOPHEN 500 MG PO TABS
1000.0000 mg | ORAL_TABLET | Freq: Four times a day (QID) | ORAL | Status: DC
  Administered 2019-09-03 – 2019-09-07 (×18): 1000 mg via ORAL
  Filled 2019-09-02 (×19): qty 2

## 2019-09-02 MED ORDER — DOCUSATE SODIUM 100 MG PO CAPS
200.0000 mg | ORAL_CAPSULE | Freq: Every day | ORAL | Status: DC
  Administered 2019-09-03 – 2019-09-05 (×3): 200 mg via ORAL
  Filled 2019-09-02 (×3): qty 2

## 2019-09-02 MED ORDER — HEPARIN SODIUM (PORCINE) 1000 UNIT/ML IJ SOLN
INTRAMUSCULAR | Status: AC
  Filled 2019-09-02: qty 1

## 2019-09-02 MED ORDER — METOPROLOL TARTRATE 12.5 MG HALF TABLET
12.5000 mg | ORAL_TABLET | Freq: Two times a day (BID) | ORAL | Status: DC
  Administered 2019-09-03 (×2): 12.5 mg via ORAL
  Filled 2019-09-02 (×2): qty 1

## 2019-09-02 MED ORDER — DEXMEDETOMIDINE HCL IN NACL 400 MCG/100ML IV SOLN: 0.0000 ug/kg/h | INTRAVENOUS | Status: DC

## 2019-09-02 MED ORDER — ORAL CARE MOUTH RINSE
15.0000 mL | Freq: Two times a day (BID) | OROMUCOSAL | Status: DC
  Administered 2019-09-02 – 2019-09-05 (×7): 15 mL via OROMUCOSAL

## 2019-09-02 MED ORDER — OXYCODONE HCL 5 MG PO TABS
5.0000 mg | ORAL_TABLET | ORAL | Status: DC | PRN
  Administered 2019-09-02 – 2019-09-03 (×3): 5 mg via ORAL
  Filled 2019-09-02 (×3): qty 1

## 2019-09-02 MED ORDER — ROCURONIUM BROMIDE 10 MG/ML (PF) SYRINGE
PREFILLED_SYRINGE | INTRAVENOUS | Status: AC
  Filled 2019-09-02: qty 10

## 2019-09-02 MED ORDER — DEXTROSE 50 % IV SOLN: 0.0000 mL | INTRAVENOUS | Status: DC | PRN

## 2019-09-02 MED ORDER — HEPARIN SODIUM (PORCINE) 1000 UNIT/ML IJ SOLN
INTRAMUSCULAR | Status: DC | PRN
  Administered 2019-09-02: 27000 [IU] via INTRAVENOUS

## 2019-09-02 MED ORDER — SODIUM CHLORIDE 0.9% FLUSH: 3.0000 mL | INTRAVENOUS | Status: DC | PRN

## 2019-09-02 MED ORDER — MAGNESIUM SULFATE 4 GM/100ML IV SOLN
4.0000 g | Freq: Once | INTRAVENOUS | Status: AC
  Administered 2019-09-02: 4 g via INTRAVENOUS
  Filled 2019-09-02: qty 100

## 2019-09-02 MED ORDER — ASPIRIN EC 325 MG PO TBEC
325.0000 mg | DELAYED_RELEASE_TABLET | Freq: Every day | ORAL | Status: DC
  Administered 2019-09-03 – 2019-09-07 (×5): 325 mg via ORAL
  Filled 2019-09-02 (×5): qty 1

## 2019-09-02 MED ORDER — MIDAZOLAM HCL (PF) 10 MG/2ML IJ SOLN
INTRAMUSCULAR | Status: AC
  Filled 2019-09-02: qty 2

## 2019-09-02 MED ORDER — PROTAMINE SULFATE 10 MG/ML IV SOLN
INTRAVENOUS | Status: AC
  Filled 2019-09-02: qty 25

## 2019-09-02 MED ORDER — LACTATED RINGERS IV SOLN: INTRAVENOUS | Status: DC | PRN

## 2019-09-02 MED ORDER — PANCRELIPASE (LIP-PROT-AMYL) 12000-38000 UNITS PO CPEP
72000.0000 [IU] | ORAL_CAPSULE | Freq: Three times a day (TID) | ORAL | Status: DC
  Administered 2019-09-03 – 2019-09-07 (×11): 72000 [IU] via ORAL
  Filled 2019-09-02: qty 6
  Filled 2019-09-02 (×2): qty 2
  Filled 2019-09-02 (×7): qty 6
  Filled 2019-09-02 (×4): qty 2

## 2019-09-02 MED ORDER — SODIUM CHLORIDE 0.9 % IV SOLN: 250.0000 mL | INTRAVENOUS | Status: DC

## 2019-09-02 MED ORDER — PHENYLEPHRINE HCL (PRESSORS) 10 MG/ML IV SOLN
INTRAVENOUS | Status: DC | PRN
  Administered 2019-09-02 (×5): 40 ug via INTRAVENOUS

## 2019-09-02 MED ORDER — POTASSIUM CHLORIDE 10 MEQ/50ML IV SOLN
10.0000 meq | INTRAVENOUS | Status: AC
  Administered 2019-09-02 (×3): 10 meq via INTRAVENOUS

## 2019-09-02 MED ORDER — SODIUM CHLORIDE 0.9 % IV SOLN: INTRAVENOUS | Status: DC | PRN

## 2019-09-02 MED ORDER — METOPROLOL TARTRATE 12.5 MG HALF TABLET
12.5000 mg | ORAL_TABLET | Freq: Once | ORAL | Status: AC
  Administered 2019-09-02: 12.5 mg via ORAL
  Filled 2019-09-02: qty 1

## 2019-09-02 MED ORDER — ACETAMINOPHEN 160 MG/5ML PO SOLN: 1000.0000 mg | Freq: Four times a day (QID) | ORAL | Status: DC

## 2019-09-02 MED ORDER — PROTAMINE SULFATE 10 MG/ML IV SOLN
INTRAVENOUS | Status: DC | PRN
  Administered 2019-09-02: 30 mg via INTRAVENOUS
  Administered 2019-09-02: 240 mg via INTRAVENOUS

## 2019-09-02 MED ORDER — TRAMADOL HCL 50 MG PO TABS
50.0000 mg | ORAL_TABLET | Freq: Four times a day (QID) | ORAL | Status: DC | PRN
  Filled 2019-09-02: qty 1

## 2019-09-02 MED ORDER — ALBUMIN HUMAN 5 % IV SOLN: 250.0000 mL | INTRAVENOUS | Status: AC | PRN

## 2019-09-02 MED ORDER — METOPROLOL TARTRATE 25 MG/10 ML ORAL SUSPENSION: 12.5000 mg | Freq: Two times a day (BID) | ORAL | Status: DC

## 2019-09-02 MED ORDER — INSULIN REGULAR(HUMAN) IN NACL 100-0.9 UT/100ML-% IV SOLN
INTRAVENOUS | Status: DC
  Filled 2019-09-02: qty 100

## 2019-09-02 MED ORDER — MIDAZOLAM HCL 2 MG/2ML IJ SOLN: 2.0000 mg | INTRAMUSCULAR | Status: DC | PRN

## 2019-09-02 MED ORDER — PANCRELIPASE (LIP-PROT-AMYL) 36000-114000 UNITS PO CPEP: 36000.0000 [IU] | ORAL_CAPSULE | ORAL | Status: DC

## 2019-09-02 MED ORDER — EPHEDRINE 5 MG/ML INJ
INTRAVENOUS | Status: AC
  Filled 2019-09-02: qty 10

## 2019-09-02 MED ORDER — ROCURONIUM BROMIDE 10 MG/ML (PF) SYRINGE
PREFILLED_SYRINGE | INTRAVENOUS | Status: DC | PRN
  Administered 2019-09-02: 70 mg via INTRAVENOUS
  Administered 2019-09-02 (×2): 50 mg via INTRAVENOUS
  Administered 2019-09-02: 30 mg via INTRAVENOUS

## 2019-09-02 MED ORDER — SODIUM CHLORIDE 0.9% FLUSH: 10.0000 mL | INTRAVENOUS | Status: DC | PRN

## 2019-09-02 MED ORDER — SODIUM CHLORIDE 0.9 % IV SOLN
1.5000 g | Freq: Two times a day (BID) | INTRAVENOUS | Status: AC
  Administered 2019-09-02 – 2019-09-04 (×4): 1.5 g via INTRAVENOUS
  Filled 2019-09-02 (×4): qty 1.5

## 2019-09-02 MED ORDER — MILRINONE LACTATE IN DEXTROSE 20-5 MG/100ML-% IV SOLN
0.2500 ug/kg/min | INTRAVENOUS | Status: DC
  Administered 2019-09-03: 0.25 ug/kg/min via INTRAVENOUS
  Filled 2019-09-02: qty 100

## 2019-09-02 MED ORDER — METOPROLOL TARTRATE 5 MG/5ML IV SOLN
2.5000 mg | INTRAVENOUS | Status: DC | PRN
  Administered 2019-09-05: 5 mg via INTRAVENOUS
  Filled 2019-09-02: qty 5

## 2019-09-02 MED ORDER — FAMOTIDINE IN NACL 20-0.9 MG/50ML-% IV SOLN
20.0000 mg | Freq: Two times a day (BID) | INTRAVENOUS | Status: AC
  Administered 2019-09-02: 20 mg via INTRAVENOUS
  Filled 2019-09-02: qty 50

## 2019-09-02 MED ORDER — PLASMA-LYTE 148 IV SOLN
INTRAVENOUS | Status: DC | PRN
  Administered 2019-09-02: 500 mL via INTRAVASCULAR

## 2019-09-02 MED ORDER — NOREPINEPHRINE 4 MG/250ML-% IV SOLN
0.0000 ug/min | INTRAVENOUS | Status: DC
  Administered 2019-09-03: 5 ug/min via INTRAVENOUS
  Filled 2019-09-02 (×2): qty 250

## 2019-09-02 MED ORDER — BISACODYL 10 MG RE SUPP: 10.0000 mg | Freq: Every day | RECTAL | Status: DC

## 2019-09-02 MED ORDER — CHLORHEXIDINE GLUCONATE CLOTH 2 % EX PADS
6.0000 | MEDICATED_PAD | Freq: Every day | CUTANEOUS | Status: DC
  Administered 2019-09-02 – 2019-09-04 (×3): 6 via TOPICAL

## 2019-09-02 MED ORDER — NICARDIPINE HCL IN NACL 20-0.86 MG/200ML-% IV SOLN
5.0000 mg/h | INTRAVENOUS | Status: DC
  Filled 2019-09-02: qty 200

## 2019-09-02 MED ORDER — EPHEDRINE SULFATE 50 MG/ML IJ SOLN
INTRAMUSCULAR | Status: DC | PRN
Start: 2019-09-02 — End: 2019-09-02
  Administered 2019-09-02: 5 mg via INTRAVENOUS

## 2019-09-02 MED ORDER — ASPIRIN 81 MG PO CHEW: 324.0000 mg | CHEWABLE_TABLET | Freq: Every day | ORAL | Status: DC

## 2019-09-02 MED ORDER — PROPOFOL 10 MG/ML IV BOLUS
INTRAVENOUS | Status: DC | PRN
  Administered 2019-09-02: 20 mg via INTRAVENOUS
  Administered 2019-09-02: 100 mg via INTRAVENOUS

## 2019-09-02 MED ORDER — ACETAMINOPHEN 160 MG/5ML PO SOLN: 650.0000 mg | Freq: Once | ORAL | Status: AC

## 2019-09-02 MED ORDER — BISACODYL 5 MG PO TBEC
10.0000 mg | DELAYED_RELEASE_TABLET | Freq: Every day | ORAL | Status: DC
  Administered 2019-09-03 – 2019-09-05 (×3): 10 mg via ORAL
  Filled 2019-09-02 (×3): qty 2

## 2019-09-02 MED ORDER — SODIUM CHLORIDE 0.9% FLUSH
10.0000 mL | Freq: Two times a day (BID) | INTRAVENOUS | Status: DC
  Administered 2019-09-02 – 2019-09-03 (×2): 10 mL

## 2019-09-02 MED ORDER — NITROGLYCERIN IN D5W 200-5 MCG/ML-% IV SOLN: 0.0000 ug/min | INTRAVENOUS | Status: DC

## 2019-09-02 MED ORDER — PRAVASTATIN SODIUM 10 MG PO TABS
20.0000 mg | ORAL_TABLET | Freq: Every day | ORAL | Status: DC
  Administered 2019-09-03 – 2019-09-06 (×4): 20 mg via ORAL
  Filled 2019-09-02 (×5): qty 2

## 2019-09-02 MED ORDER — PROTAMINE SULFATE 10 MG/ML IV SOLN
INTRAVENOUS | Status: AC
  Filled 2019-09-02: qty 5

## 2019-09-02 MED ORDER — ONDANSETRON HCL 4 MG/2ML IJ SOLN
4.0000 mg | Freq: Four times a day (QID) | INTRAMUSCULAR | Status: DC | PRN
  Administered 2019-09-03: 4 mg via INTRAVENOUS
  Filled 2019-09-02: qty 2

## 2019-09-02 MED ORDER — SODIUM CHLORIDE 0.9 % IV SOLN: INTRAVENOUS | Status: DC

## 2019-09-02 MED ORDER — LIDOCAINE 2% (20 MG/ML) 5 ML SYRINGE
INTRAMUSCULAR | Status: DC | PRN
  Administered 2019-09-02: 100 mg via INTRAVENOUS

## 2019-09-02 MED ORDER — SODIUM CHLORIDE 0.9 % IR SOLN
Status: DC | PRN
Start: 2019-09-02 — End: 2019-09-02
  Administered 2019-09-02 (×5): 1000 mL

## 2019-09-02 MED ORDER — LACTATED RINGERS IV SOLN
INTRAVENOUS | Status: DC | PRN
Start: 2019-09-02 — End: 2019-09-02

## 2019-09-02 MED ORDER — VANCOMYCIN HCL IN DEXTROSE 1-5 GM/200ML-% IV SOLN
1000.0000 mg | Freq: Once | INTRAVENOUS | Status: AC
  Administered 2019-09-02: 1000 mg via INTRAVENOUS

## 2019-09-02 MED ORDER — ALBUMIN HUMAN 5 % IV SOLN
INTRAVENOUS | Status: DC | PRN
Start: 2019-09-02 — End: 2019-09-02

## 2019-09-02 MED ORDER — PHENYLEPHRINE 40 MCG/ML (10ML) SYRINGE FOR IV PUSH (FOR BLOOD PRESSURE SUPPORT)
PREFILLED_SYRINGE | INTRAVENOUS | Status: AC
  Filled 2019-09-02: qty 10

## 2019-09-02 MED ORDER — CHLORHEXIDINE GLUCONATE 0.12 % MT SOLN
15.0000 mL | OROMUCOSAL | Status: AC
  Administered 2019-09-02: 15 mL via OROMUCOSAL

## 2019-09-02 MED ORDER — PHENYLEPHRINE HCL-NACL 20-0.9 MG/250ML-% IV SOLN: 0.0000 ug/min | INTRAVENOUS | Status: DC

## 2019-09-02 MED ORDER — CHLORHEXIDINE GLUCONATE 0.12 % MT SOLN
15.0000 mL | Freq: Once | OROMUCOSAL | Status: DC
  Filled 2019-09-02: qty 15

## 2019-09-02 MED ORDER — SODIUM CHLORIDE 0.45 % IV SOLN: INTRAVENOUS | Status: DC | PRN

## 2019-09-02 MED ORDER — MORPHINE SULFATE (PF) 2 MG/ML IV SOLN
1.0000 mg | INTRAVENOUS | Status: DC | PRN
  Administered 2019-09-02: 2 mg via INTRAVENOUS
  Filled 2019-09-02: qty 1

## 2019-09-02 MED ORDER — CHLORHEXIDINE GLUCONATE 4 % EX LIQD: 30.0000 mL | CUTANEOUS | Status: DC

## 2019-09-02 SURGICAL SUPPLY — 89 items
ADH SKN CLS APL DERMABOND .7 (GAUZE/BANDAGES/DRESSINGS) ×3
BAG DECANTER FOR FLEXI CONT (MISCELLANEOUS) ×4 IMPLANT
BLADE CLIPPER SURG (BLADE) ×4 IMPLANT
BLADE STERNUM SYSTEM 6 (BLADE) ×4 IMPLANT
BLADE SURG 11 STRL SS (BLADE) ×2 IMPLANT
BNDG CMPR MED 15X6 ELC VLCR LF (GAUZE/BANDAGES/DRESSINGS) ×3
BNDG ELASTIC 4X5.8 VLCR STR LF (GAUZE/BANDAGES/DRESSINGS) ×4 IMPLANT
BNDG ELASTIC 6X15 VLCR STRL LF (GAUZE/BANDAGES/DRESSINGS) ×1 IMPLANT
BNDG ELASTIC 6X5.8 VLCR STR LF (GAUZE/BANDAGES/DRESSINGS) ×4 IMPLANT
BNDG GAUZE ELAST 4 BULKY (GAUZE/BANDAGES/DRESSINGS) ×4 IMPLANT
CABLE SURGICAL S-101-97-12 (CABLE) ×4 IMPLANT
CANISTER SUCT 3000ML PPV (MISCELLANEOUS) ×4 IMPLANT
CANNULA EZ GLIDE 8.0 24FR (CANNULA) ×4 IMPLANT
CANNULA MC2 2 STG 29/37 NON-V (CANNULA) ×3 IMPLANT
CANNULA MC2 TWO STAGE (CANNULA) ×4
CANNULA NON VENT 20FR 12 (CANNULA) ×1 IMPLANT
CATH ROBINSON RED A/P 18FR (CATHETERS) ×8 IMPLANT
CLIP RETRACTION 3.0MM CORONARY (MISCELLANEOUS) ×2 IMPLANT
CLIP VESOCCLUDE MED 24/CT (CLIP) IMPLANT
CLIP VESOCCLUDE SM WIDE 24/CT (CLIP) IMPLANT
CONN ST 1/2X1/2  BEN (MISCELLANEOUS) ×4
CONN ST 1/2X1/2 BEN (MISCELLANEOUS) ×3 IMPLANT
CONNECTOR BLAKE 2:1 CARIO BLK (MISCELLANEOUS) ×4 IMPLANT
DERMABOND ADVANCED (GAUZE/BANDAGES/DRESSINGS) ×1
DERMABOND ADVANCED .7 DNX12 (GAUZE/BANDAGES/DRESSINGS) IMPLANT
DRAIN CHANNEL 19F RND (DRAIN) ×11 IMPLANT
DRAIN CONNECTOR BLAKE 1:1 (MISCELLANEOUS) ×4 IMPLANT
DRAPE CARDIOVASCULAR INCISE (DRAPES) ×4
DRAPE INCISE IOBAN 66X45 STRL (DRAPES) IMPLANT
DRAPE SLUSH/WARMER DISC (DRAPES) ×4 IMPLANT
DRAPE SRG 135X102X78XABS (DRAPES) ×3 IMPLANT
DRSG COVADERM 4X14 (GAUZE/BANDAGES/DRESSINGS) ×4 IMPLANT
DRSG COVADERM 4X8 (GAUZE/BANDAGES/DRESSINGS) ×1 IMPLANT
ELECT BLADE 4.0 EZ CLEAN MEGAD (MISCELLANEOUS) ×4
ELECT REM PT RETURN 9FT ADLT (ELECTROSURGICAL) ×8
ELECTRODE BLDE 4.0 EZ CLN MEGD (MISCELLANEOUS) ×1 IMPLANT
ELECTRODE REM PT RTRN 9FT ADLT (ELECTROSURGICAL) ×6 IMPLANT
FELT TEFLON 1X6 (MISCELLANEOUS) ×8 IMPLANT
GAUZE SPONGE 4X4 12PLY STRL (GAUZE/BANDAGES/DRESSINGS) ×7 IMPLANT
GLOVE BIO SURGEON STRL SZ 6.5 (GLOVE) ×14 IMPLANT
GLOVE BIO SURGEON STRL SZ7 (GLOVE) ×8 IMPLANT
GLOVE BIO SURGEON STRL SZ7.5 (GLOVE) ×3 IMPLANT
GLOVE BIOGEL M STRL SZ7.5 (GLOVE) ×8 IMPLANT
GLOVE BIOGEL PI IND STRL 6.5 (GLOVE) IMPLANT
GLOVE BIOGEL PI INDICATOR 6.5 (GLOVE) ×1
GOWN STRL REUS W/ TWL LRG LVL3 (GOWN DISPOSABLE) ×13 IMPLANT
GOWN STRL REUS W/ TWL XL LVL3 (GOWN DISPOSABLE) ×6 IMPLANT
GOWN STRL REUS W/TWL LRG LVL3 (GOWN DISPOSABLE) ×20
GOWN STRL REUS W/TWL XL LVL3 (GOWN DISPOSABLE) ×16
HEMOSTAT POWDER SURGIFOAM 1G (HEMOSTASIS) ×12 IMPLANT
KIT BASIN OR (CUSTOM PROCEDURE TRAY) ×4 IMPLANT
KIT CATH SUCT 8FR (CATHETERS) ×2 IMPLANT
KIT SUCTION CATH 14FR (SUCTIONS) ×4 IMPLANT
KIT TURNOVER KIT B (KITS) ×4 IMPLANT
KIT VASOVIEW HEMOPRO 2 VH 4000 (KITS) ×4 IMPLANT
LEAD PACING MYOCARDI (MISCELLANEOUS) ×4 IMPLANT
MARKER GRAFT CORONARY BYPASS (MISCELLANEOUS) ×10 IMPLANT
NS IRRIG 1000ML POUR BTL (IV SOLUTION) ×20 IMPLANT
PACK ACCESSORY CANNULA KIT (KITS) ×4 IMPLANT
PACK E OPEN HEART (SUTURE) ×4 IMPLANT
PACK OPEN HEART (CUSTOM PROCEDURE TRAY) ×4 IMPLANT
PAD ARMBOARD 7.5X6 YLW CONV (MISCELLANEOUS) ×8 IMPLANT
PAD ELECT DEFIB RADIOL ZOLL (MISCELLANEOUS) ×4 IMPLANT
PENCIL BUTTON HOLSTER BLD 10FT (ELECTRODE) ×4 IMPLANT
POSITIONER HEAD DONUT 9IN (MISCELLANEOUS) ×4 IMPLANT
PUNCH AORTIC ROTATE 4.0MM (MISCELLANEOUS) ×4 IMPLANT
SET CARDIOPLEGIA MPS 5001102 (MISCELLANEOUS) ×1 IMPLANT
SUPPORT HEART JANKE-BARRON (MISCELLANEOUS) ×4 IMPLANT
SUT BONE WAX W31G (SUTURE) ×4 IMPLANT
SUT ETHIBOND X763 2 0 SH 1 (SUTURE) ×8 IMPLANT
SUT MNCRL AB 3-0 PS2 18 (SUTURE) ×8 IMPLANT
SUT MNCRL AB 4-0 PS2 18 (SUTURE) ×2 IMPLANT
SUT PDS AB 1 CTX 36 (SUTURE) ×8 IMPLANT
SUT PROLENE 4 0 SH DA (SUTURE) ×4 IMPLANT
SUT PROLENE 5 0 C 1 36 (SUTURE) ×10 IMPLANT
SUT PROLENE 5 0 C1 (SUTURE) ×1 IMPLANT
SUT PROLENE 7 0 BV1 MDA (SUTURE) ×4 IMPLANT
SUT STEEL 6MS V (SUTURE) ×8 IMPLANT
SUT VIC AB 2-0 CT1 27 (SUTURE) ×4
SUT VIC AB 2-0 CT1 TAPERPNT 27 (SUTURE) ×1 IMPLANT
SYSTEM SAHARA CHEST DRAIN ATS (WOUND CARE) ×4 IMPLANT
TAPE CLOTH SURG 4X10 WHT LF (GAUZE/BANDAGES/DRESSINGS) ×2 IMPLANT
TAPE PAPER 3X10 WHT MICROPORE (GAUZE/BANDAGES/DRESSINGS) ×2 IMPLANT
TOWEL GREEN STERILE (TOWEL DISPOSABLE) ×4 IMPLANT
TOWEL GREEN STERILE FF (TOWEL DISPOSABLE) ×4 IMPLANT
TRAY FOLEY SLVR 16FR TEMP STAT (SET/KITS/TRAYS/PACK) ×4 IMPLANT
TUBING LAP HI FLOW INSUFFLATIO (TUBING) ×4 IMPLANT
UNDERPAD 30X36 HEAVY ABSORB (UNDERPADS AND DIAPERS) ×4 IMPLANT
WATER STERILE IRR 1000ML POUR (IV SOLUTION) ×8 IMPLANT

## 2019-09-02 NOTE — Anesthesia Procedure Notes (Signed)
Procedure Name: Intubation Date/Time: 09/02/2019 7:46 AM Performed by: Alain Marion, CRNA Pre-anesthesia Checklist: Patient identified, Emergency Drugs available, Suction available and Patient being monitored Patient Re-evaluated:Patient Re-evaluated prior to induction Oxygen Delivery Method: Circle System Utilized Preoxygenation: Pre-oxygenation with 100% oxygen Induction Type: IV induction Ventilation: Mask ventilation without difficulty Laryngoscope Size: Miller and 2 Grade View: Grade I Tube type: Oral Tube size: 8.0 mm Number of attempts: 1 Airway Equipment and Method: Stylet and Oral airway Placement Confirmation: ETT inserted through vocal cords under direct vision,  positive ETCO2 and breath sounds checked- equal and bilateral Secured at: 23 cm Tube secured with: Tape Dental Injury: Teeth and Oropharynx as per pre-operative assessment

## 2019-09-02 NOTE — Progress Notes (Signed)
Patient ID: Jessica Hart, female   DOB: 12-29-1940, 79 y.o.   MRN: [id number]  TCTS Evening Rounds:   Hemodynamically stable  CI = 2.8 on milrinone 0.25 and NE 5.  Extubated  Urine output good  CT output low  CBC    Component Value Date/Time   WBC 11.8 (H) 09/02/2019 1812   RBC 4.17 09/02/2019 1812   HGB 12.0 09/02/2019 1812   HGB 15.6 08/07/2019 1232   HCT 36.2 09/02/2019 1812   HCT 47.3 (H) 08/07/2019 1232   PLT 249 09/02/2019 1812   PLT 323 08/07/2019 1232   MCV 86.8 09/02/2019 1812   MCV 90 08/07/2019 1232   MCH 28.8 09/02/2019 1812   MCHC 33.1 09/02/2019 1812   RDW 13.3 09/02/2019 1812   RDW 13.6 08/07/2019 1232   LYMPHSABS 1.9 01/29/2019 1057   MONOABS 776 08/29/2016 0920   EOSABS 0.2 01/29/2019 1057   BASOSABS 0.1 01/29/2019 1057     BMET    Component Value Date/Time   NA 141 09/02/2019 1635   NA 141 08/07/2019 1232   K 4.5 09/02/2019 1635   CL 104 09/02/2019 1128   CO2 18 (L) 08/29/2019 1225   GLUCOSE 118 (H) 09/02/2019 1128   BUN 14 09/02/2019 1128   BUN 13 08/07/2019 1232   CREATININE 0.60 09/02/2019 1128   CREATININE 0.69 01/10/2017 1235   CALCIUM 9.8 08/29/2019 1225   GFRNONAA 59 (L) 08/29/2019 1225   GFRNONAA 86 01/10/2017 1235   GFRAA >60 08/29/2019 1225   GFRAA 99 01/10/2017 1235     A/P:  Stable postop course. Continue current plans

## 2019-09-02 NOTE — Anesthesia Procedure Notes (Signed)
Arterial Line Insertion Start/End7/07/2019 6:50 AM, 09/02/2019 7:00 AM Performed by: Albertha Ghee, MD, anesthesiologist  Patient location: Pre-op. Preanesthetic checklist: patient identified, IV checked, site marked, risks and benefits discussed, surgical consent, monitors and equipment checked, pre-op evaluation, timeout performed and anesthesia consent Lidocaine 1% used for infiltration radial was placed Catheter size: 20 Fr Hand hygiene performed  and maximum sterile barriers used   Attempts: 3 Procedure performed using ultrasound guided technique. Ultrasound Notes:anatomy identified, needle tip was noted to be adjacent to the nerve/plexus identified and no ultrasound evidence of intravascular and/or intraneural injection Following insertion, dressing applied. Post procedure assessment: normal and unchanged  Patient tolerated the procedure well with no immediate complications.

## 2019-09-02 NOTE — Progress Notes (Signed)
1st call to ICU @ 1130 2nd call to ICU@ 1152 Last call to Jessica Hart

## 2019-09-02 NOTE — Discharge Summary (Signed)
Physician Discharge Summary       [address]       [place] [address]             [phone]    Patient ID: AIMA MCWHIRT MRN: [id number] DOB/AGE: [birth year] 79 y.o.  Admit date: 09/02/2019 Discharge date: 09/07/2019  Admission Diagnoses: 1. Coronary artery disease 2. Coronary artery calcification seen on CAT scan  Discharge Diagnoses:  1.   S/P CABG x 3 2. Expected ABL anemia 3. History of Diabetes mellitus type 2,  4. History of Hypertension 5. History of Hyperlipidemia 6. History of GERD (gastroesophageal reflux disease) 7. History of remote tobacco abuse 8. History of diverticulosis   Consults: None  Procedure (s):  CABG X 3, LIMA LAD, reverse saphenous vein graft to PDA, and OM1 Endoscopic greater saphenous vein harvest on the right Intra-operative Transesophageal Echocardiogram by Dr. Kipp Brood on 09/02/2019.  History of Presenting Illness: Mrs. Whitenight is a 79 year old female who is referred for surgical evaluation of severe three-vessel coronary disease congestive heart failure with an EF of 35 to 40%. Over the past several months she has had progressive exertional angina to the point where she becomes short of breath and has chest pressure simply with washing dishes. She is not very mobile and that she spends most of the day in her recliner watching television. She denies any lower extremity swelling or pain at rest. She is able to lay flat without much pain or shortness of breath. She does endorse some left leg cramps, at rest. She is a former smoker quit about 15 years ago. She has been diagnosed with pancreatic insufficiency has been placed on Creon. In summary, this is a 79 year old female with severe three-vessel disease and CTO lesions to the LAD and RCA. Her stress echo shows that she has an EF that is around 35%. Additionally, Dr. 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 It does not appear to be porcelain but during the time of surgery may need to consider alternate arterial access. Potential risks, benefits, and complications of the surgery were discussed with the patient and she agreed to proceed with surgery. Pre operative carotid duplex US showed no significant internal carotid artery stenosis bilaterally. She underwent a CABG x 3 on 09/02/2019.  Brief Hospital Course:  The patient was extubated the evening of surgery without difficulty. She remained afebrile and hemodynamically stable. She was weaned off Nor epinephrine and Milrinone drip[s. Gordy Councilman, a line,  and foley were removed early in the post operative course. Chest tubes remained for a few days then were removed once output decreased.  Lopressor was started and titrated accordingly. She was volume over loaded and diuresed. She had ABL anemia. She did not require a post op transfusion. Anemia was resolved as last H and H was  12.6 and 38.7. She was weaned off the insulin drip. She was initially given scheduled Insulin. Metformin and Wilder Glade were restarted once she was tolerating a diet and creatinine was stabilized.  The patient's glucose remained well controlled.The patient's HGA1C pre op was  6.6  She has a history of pancreatic insufficiency and Creon was restarted immediately post op. The patient was felt surgically stable for transfer from the ICU to [facility] for further convalescence on 07/08 . She continues to progress with cardiac rehab. She has been requiring several liters of oxygen post op. This is multifactorial-deconditioned, prior tobacco abuse, and volume overload.  She was weaned to room air. She did not meet criteria for home oxygen. Her heart rate began to increase with ambulation (ST not a fib) so Lopressor was increased to 37.5 mg bid. She still has mild volume overload so Lasix/potassium will be continued for one week. She has been tolerating a diet and has had a bowel movement.  As  discussed with Dr. Kipp Brood, the patient is felt surgically stable for discharge today.   Latest Vital Signs: Blood pressure 125/67, pulse 98, temperature 97.6 F (36.4 C), temperature source Oral, resp. rate 18, height _0  (1.6 m), weight 71.7 kg, SpO2 93 %.  Physical Exam: Cardiovascular: RRR Pulmonary: Slightly diminished bibasilar breath sounds Abdomen: Soft, non tender, bowel sounds present. Extremities: Trace bilateral lower extremity edema. Wounds: Clean and dry.  No erythema or signs of infection.   Discharge Condition: Stable and discharged to home.  Recent laboratory studies:  Lab Results  Component Value Date   WBC 14.2 (H) 09/05/2019   HGB 12.6 09/05/2019   HCT 38.7 09/05/2019   MCV 88.0 09/05/2019   PLT 233 09/05/2019   Lab Results  Component Value Date   NA 136 09/05/2019   K 4.3 09/05/2019   CL 100 09/05/2019   CO2 26 09/05/2019   CREATININE 0.90 09/05/2019   GLUCOSE 101 (H) 09/05/2019      Diagnostic Studies: DG Chest 2 View  Result Date: 09/05/2019 CLINICAL DATA:  History of pneumothorax. EXAM: CHEST - 2 VIEW COMPARISON:  09/04/2019. FINDINGS: Interim removal of left chest tube. No pneumothorax noted. Interim removal right IJ sheath and mediastinal drainage catheter. Prior CABG. Stable cardiomegaly. No pulmonary venous congestion. Interim decrease interstitial prominence suggesting clearing of interstitial edema. Low lung volumes with persistent bibasilar atelectasis/infiltrates. Small bilateral pleural effusions. IMPRESSION: 1. Interim removal of left chest tube. No pneumothorax noted. Interim removal of right IJ sheath and mediastinal drainage catheter. 2. Prior CABG. Stable cardiomegaly. No pulmonary venous congestion. Interim decrease interstitial prominence suggesting clearing of interstitial edema. 3. Low lung volumes with persistent bibasilar atelectasis/infiltrates. Small bilateral pleural effusions. Electronically Signed   By: Marcello Moores  Register   On:  09/05/2019 06:50   DG Chest 2 View  Result Date: 08/29/2019 CLINICAL DATA:  Preoperative for CABG, shortness of breath and chest pain EXAM: CHEST - 2 VIEW COMPARISON:  CT 02/04/2019, radiograph 11/07/2006 FINDINGS: Chronic hyperinflation with coarsened interstitium similar to comparison CTs and radiography. Stable areas of opacity in the left mid lung likely reflect scarring in the lingula. No consolidation, features of edema, pneumothorax, or effusion. The aorta is calcified. The remaining cardiomediastinal contours are unremarkable. No acute osseous or soft tissue abnormality. Degenerative changes are present in the imaged spine and shoulders. IMPRESSION: Chronic hyperinflation and coarsened interstitium with scarring in the left mid lung/lingula. No acute cardiopulmonary findings. Electronically Signed   By: Lovena Le M.D.   On: 08/29/2019 22:53   CARDIAC CATHETERIZATION  Result Date: 08/20/2019  Severe three-vessel coronary artery obstructive disease with severe three-vessel calcification.  Functionally occluded proximal to mid LAD supplied by left to left collaterals.  Diffusely diseased circumflex with proximal 80% stenosis and distal 90% stenosis before a small third obtuse marginal.  Totally occluded proximal RCA filled by left to right collaterals.  Left main is widely patent  Ischemic cardiomyopathy with severe inferobasal hypokinesis and apical wall motion abnormality as well.  EF 35 to 40%.  Normal LVEDP. RECOMMENDATIONS:  If not already done, echocardiography should be done to assess the mitral valve  Refer to [facility] for clinical evaluation and consideration of multivessel grafting.  Continue aspirin, tight diabetes control, and guideline directed therapy for systolic heart failure.  DG CHEST PORT 1 VIEW  Result Date: 09/04/2019 CLINICAL DATA:  Pleural effusion. EXAM: PORTABLE CHEST 1 VIEW COMPARISON:  09/03/2019. FINDINGS: Interval removal of Swan-Ganz catheter. Right IJ sheath in  stable position. Mediastinal drainage catheter left chest tube in stable position. No pneumothorax. Prior CABG. Stable cardiomegaly. Increase interstitial prominence. Interstitial edema cannot be excluded. Pneumonitis can not be excluded. Persistent left mid and left base subsegmental atelectasis/infiltrates. No prominent pleural effusion. IMPRESSION: 1. Interim removal Swan-Ganz catheter. Remaining lines and tubes including left chest tube in stable position. No pneumothorax. 2. Prior CABG. Stable cardiomegaly. Increase interstitial prominence noted bilaterally suggesting mild interstitial edema. 3. Persistent left mid and left base subsegmental atelectasis/infiltrates. Electronically Signed   By: Marcello Moores  Register   On: 09/04/2019 05:58   DG Chest Port 1 View  Result Date: 09/03/2019 CLINICAL DATA:  Postop day 1 CABG.  Extubation. EXAM: PORTABLE CHEST 1 VIEW COMPARISON:  09/02/2019 and earlier. FINDINGS: Sternotomy for CABG. Interval extubation since yesterday. RIGHT jugular Swan-Ganz catheter tip projects at the expected location of the main pulmonary trunk. Mediastinal drainage tube. LEFT chest tube in place with no pneumothorax. Stable moderate cardiomegaly. Consolidation in the LEFT LOWER LOBE and LEFT mid lung, slightly increased since yesterday. Minimal linear atelectasis at the RIGHT lung base. Pulmonary vascularity normal without evidence of pulmonary edema. IMPRESSION: 1. Support apparatus satisfactory. 2. No pneumothorax. 3. Atelectasis favored over pneumonia involving the LEFT LOWER LOBE and LEFT mid lung, slightly increased since yesterday. 4. Minimal RIGHT basilar atelectasis. Electronically Signed   By: Evangeline Dakin M.D.   On: 09/03/2019 08:20   DG Chest Port 1 View  Result Date: 09/02/2019 CLINICAL DATA:  Hypoxia. Status post coronary artery bypass grafting. EXAM: PORTABLE CHEST 1 VIEW COMPARISON:  August 29, 2019 FINDINGS: Patient is status post coronary artery bypass grafting. Endotracheal  tube tip is 0.8 cm above the carina. Nasogastric tube tip and side port are in the stomach. Swan-Ganz catheter tip is in the main pulmonary outflow tract. There is a left chest tube and a mediastinal drain. No pneumothorax. There is consolidation in the left base medially with minimal left pleural effusion. There is atelectatic change in the mid lung on the left. Right lung is clear. Heart is upper normal in size with pulmonary vascularity normal. No adenopathy. There is aortic atherosclerosis. No bone lesions. IMPRESSION: Tube and catheter positions as described without pneumothorax. Note that the endotracheal tube tip is near the carina. It may be prudent to consider withdrawing endotracheal tube 2.5-3 cm. Airspace opacity medial left base. Suspect atelectasis with potential superimposed focus of pneumonia and/or aspiration. Minimal left pleural effusion. There is mild left midlung atelectasis. Right lung clear.  Heart upper normal in size. Aortic Atherosclerosis (ICD10-I70.0). These results will be called to the ordering clinician or representative by the Radiologist Assistant, and communication documented in the PACS or [employer]. Electronically Signed   By: Lowella Grip III M.D.   On: 09/02/2019 13:23   ECHOCARDIOGRAM COMPLETE  Result Date: 08/29/2019    ECHOCARDIOGRAM REPORT   Patient Name:   ANGLEA GORDNER Date of Exam: 08/29/2019 Medical Rec #:  [id number]           Height:       63.0 in Accession #:    [id number]          Weight:  166.3 lb Date of Birth:  [birth year]           BSA:          1.788 m Patient Age:    29 years            BP:           134/69 mmHg Patient Gender: F                   HR:           67 bpm. Exam Location:  Outpatient Procedure: 2D Echo                                 MODIFIED REPORT:  This report was modified by Skeet Latch MD on 08/29/2019 due to wall motion                                   abnormality.  Indications:     coronary artery disease. Pre-op  testing  History:         Patient has prior history of Echocardiogram examinations, most                  recent 02/26/2018. Risk Factors:Diabetes, Hypertension and                  Dyslipidemia.  Sonographer:     Johny Chess RDCS Referring Phys:  [id number] Lucile Crater LIGHTFOOT Diagnosing Phys: Skeet Latch MD IMPRESSIONS  1. Hypokinesis of the inferior, inferoseptal, anterioseptal, apical anterior, and apical myocardium.. Left ventricular ejection fraction, by estimation, is 40 to 45%. The left ventricle has mildly decreased function. The left ventricle demonstrates regional wall motion abnormalities (see scoring diagram/findings for description). Left ventricular diastolic parameters are consistent with Grade I diastolic dysfunction (impaired relaxation).  2. Right ventricular systolic function is normal. The right ventricular size is normal.  3. The mitral valve is normal in structure. Mild mitral valve regurgitation. No evidence of mitral stenosis.  4. The aortic valve is normal in structure. Aortic valve regurgitation is not visualized. No aortic stenosis is present.  5. The inferior vena cava is normal in size with greater than 50% respiratory variability, suggesting right atrial pressure of 3 mmHg. FINDINGS  Left Ventricle: Hypokinesis of the inferior, inferoseptal, anterioseptal, apical anterior, and apical myocardium. Left ventricular ejection fraction, by estimation, is 40 to 45%. The left ventricle has mildly decreased function. The left ventricle demonstrates regional wall motion abnormalities. The left ventricular internal cavity size was normal in size. There is no left ventricular hypertrophy. Left ventricular diastolic parameters are consistent with Grade I diastolic dysfunction (impaired relaxation). Indeterminate filling pressures. Right Ventricle: The right ventricular size is normal. No increase in right ventricular wall thickness. Right ventricular systolic function is normal. Left  Atrium: Left atrial size was normal in size. Right Atrium: Right atrial size was normal in size. Pericardium: There is no evidence of pericardial effusion. Mitral Valve: The mitral valve is normal in structure. Normal mobility of the mitral valve leaflets. Mild mitral valve regurgitation. No evidence of mitral valve stenosis. Tricuspid Valve: The tricuspid valve is normal in structure. Tricuspid valve regurgitation is not demonstrated. No evidence of tricuspid stenosis. Aortic Valve: The aortic valve is normal in structure. Aortic valve regurgitation is not visualized. No aortic stenosis is present. Pulmonic Valve: The pulmonic valve  was normal in structure. Pulmonic valve regurgitation is not visualized. No evidence of pulmonic stenosis. Aorta: The aortic root is normal in size and structure. Venous: The inferior vena cava is normal in size with greater than 50% respiratory variability, suggesting right atrial pressure of 3 mmHg. IAS/Shunts: No atrial level shunt detected by color flow Doppler.  LEFT VENTRICLE PLAX 2D LVIDd:         5.10 cm     Diastology LVIDs:         3.40 cm     LV e' lateral:   9.79 cm/s LV PW:         1.00 cm     LV E/e' lateral: 6.1 LV IVS:        0.90 cm     LV e' medial:    4.90 cm/s LVOT diam:     1.80 cm     LV E/e' medial:  12.2 LV SV:         51 LV SV Index:   29 LVOT Area:     2.54 cm  LV Volumes (MOD) LV vol d, MOD A2C: 83.8 ml LV vol d, MOD A4C: 85.2 ml LV vol s, MOD A2C: 46.4 ml LV vol s, MOD A4C: 47.0 ml LV SV MOD A2C:     37.4 ml LV SV MOD A4C:     85.2 ml LV SV MOD BP:      38.9 ml RIGHT VENTRICLE            IVC RV S prime:     7.94 cm/s  IVC diam: 1.40 cm TAPSE (M-mode): 1.5 cm LEFT ATRIUM             Index       RIGHT ATRIUM           Index LA diam:        4.30 cm 2.41 cm/m  RA Area:     11.00 cm LA Vol (A2C):   43.2 ml 24.16 ml/m RA Volume:   23.50 ml  13.14 ml/m LA Vol (A4C):   38.9 ml 21.76 ml/m LA Biplane Vol: 42.6 ml 23.83 ml/m  AORTIC VALVE LVOT Vmax:   88.60 cm/s  LVOT Vmean:  60.100 cm/s LVOT VTI:    0.201 m  AORTA Ao Root diam: 3.10 cm Ao Asc diam:  3.20 cm MITRAL VALVE MV Area (PHT): 2.73 cm     SHUNTS MV Decel Time: 278 msec     Systemic VTI:  0.20 m MV E velocity: 60.00 cm/s   Systemic Diam: 1.80 cm MV A velocity: 125.00 cm/s MV E/A ratio:  0.48 Skeet Latch MD Electronically signed by Skeet Latch MD Signature Date/Time: 08/29/2019/4:45:08 PM    Final (Updated)    ECHO INTRAOPERATIVE TEE  Result Date: 09/02/2019  *INTRAOPERATIVE TRANSESOPHAGEAL REPORT *  Patient Name:   ROSANNE WOHLFARTH Date of Exam: 09/02/2019 Medical Rec #:  [id number]           Height:       63.0 in Accession #:    [id number]          Weight:       166.2 lb Date of Birth:  1940-06-26           BSA:          1.79 m Patient Age:    61 years            BP:  125/70 mmHg Patient Gender: F                   HR:           65 bpm. Exam Location:  Anesthesiology Transesophogeal exam was perform intraoperatively during surgical procedure. Patient was closely monitored under general anesthesia during the entirety of examination. Indications:     I25.110 Atherosclerotic heart disease of native coronary artery                  with unstable angina pectoris Sonographer:     Renold Don MD Performing Phys: [id number] Lucile Crater LIGHTFOOT Diagnosing Phys: Renold Don MD Complications: No known complications during this procedure. POST-OP IMPRESSIONS - Left Ventricle: The left ventricle is unchanged from pre-bypass. - Right Ventricle: The right ventricle appears unchanged from pre-bypass. - Aorta: The aorta appears unchanged from pre-bypass. - Left Atrium: The left atrium appears unchanged from pre-bypass. - Left Atrial Appendage: The left atrial appendage appears unchanged from pre-bypass. - Aortic Valve: The aortic valve appears unchanged from pre-bypass. - Mitral Valve: The mitral valve appears unchanged from pre-bypass. - Tricuspid Valve: The tricuspid valve appears unchanged from pre-bypass. -  Interatrial Septum: The interatrial septum appears unchanged from pre-bypass. - Interventricular Septum: The interventricular septum appears unchanged from pre-bypass. - Pericardium: The pericardium appears unchanged from pre-bypass. PRE-OP FINDINGS  Left Ventricle: The left ventricle has mild-moderately reduced systolic function, with an ejection fraction of 40-45%. The cavity size was normal. There is mild concentric left ventricular hypertrophy. Left ventricular diffuse hypokinesis. Right Ventricle: The right ventricle has normal systolic function. The cavity was normal. There is no increase in right ventricular wall thickness. Left Atrium: Left atrial size was normal in size. Right Atrium: Right atrial size was normal in size.  Interatrial Septum: No atrial level shunt detected by color flow Doppler. There is left bowing of the interatrial septum, suggestive of elevated right atrial pressure. Pericardium: There is no evidence of pericardial effusion. Mitral Valve: The mitral valve is normal in structure. Mitral valve regurgitation is mild by color flow Doppler. The MR jet is centrally-directed. There is No evidence of mitral stenosis. Tricuspid Valve: The tricuspid valve was normal in structure. Tricuspid valve regurgitation is trivial by color flow Doppler. Aortic Valve: The aortic valve is tricuspid Aortic valve regurgitation was not visualized by color flow Doppler. There is no evidence of aortic valve stenosis. Pulmonic Valve: The pulmonic valve was normal in structure. Pulmonic valve regurgitation is trivial by color flow Doppler. Aorta: The is normal in size and structure. The aortic arch was not well visualized. There is evidence of immobile plaque in the ascending aorta, aortic arch and descending aorta; Grade II, measuring 2-26m in size. Heavily calcified aorta.  TRenold DonMD Electronically signed by TRenold DonMD Signature Date/Time: 09/02/2019/12:05:21 PM    Final    VAS [place]PLER PRE  CABG  Result Date: 08/29/2019 PREOPERATIVE VASCULAR EVALUATION  Indications:      Pre-CABG. Risk Factors:     Hypertension, hyperlipidemia, Diabetes. Comparison Study: 01-03-2019 Carotid duplex                   02-14-2017 Lower extremity arterial seg multi Performing Technologist: HDarlin Coco Examination Guidelines: A complete evaluation includes B-mode imaging, spectral Doppler, color Doppler, and power Doppler as needed of all accessible portions of each vessel. Bilateral testing is considered an integral part of a complete examination. Limited examinations for reoccurring indications may be performed as noted.  Right Carotid Findings: +----------+--------+--------+--------+------------+------------------+           PSV cm/sEDV cm/sStenosisDescribe    Comments           +----------+--------+--------+--------+------------+------------------+ CCA Prox  74      11              heterogenousintimal thickening +----------+--------+--------+--------+------------+------------------+ CCA Distal59      11              heterogenousintimal thickening +----------+--------+--------+--------+------------+------------------+ ICA Prox  56      13      1-39%   calcific                       +----------+--------+--------+--------+------------+------------------+ ICA Distal36      12                                             +----------+--------+--------+--------+------------+------------------+ ECA       47                                                     +----------+--------+--------+--------+------------+------------------+ +----------+--------+-------+----------------+------------+           PSV cm/sEDV cmsDescribe        Arm Pressure +----------+--------+-------+----------------+------------+ Subclavian               Multiphasic, WNL             +----------+--------+-------+----------------+------------+ +---------+--------+--+--------+--+---------+ VertebralPSV  cm/s45EDV cm/s13Antegrade +---------+--------+--+--------+--+---------+ Left Carotid Findings: +----------+-------+-------+--------+------------------------+-----------------+           PSV    EDV    StenosisDescribe                Comments                    cm/s   cm/s                                                     +----------+-------+-------+--------+------------------------+-----------------+ CCA Prox  98     14             heterogenous and        intimal                                           irregular               thickening        +----------+-------+-------+--------+------------------------+-----------------+ CCA Distal73     15             heterogenous            intimal  thickening        +----------+-------+-------+--------+------------------------+-----------------+ ICA Prox  60     20     1-39%   heterogenous                              +----------+-------+-------+--------+------------------------+-----------------+ ICA Distal41     14                                                       +----------+-------+-------+--------+------------------------+-----------------+ ECA       67                                                              +----------+-------+-------+--------+------------------------+-----------------+ +----------+--------+--------+----------------+------------+ SubclavianPSV cm/sEDV cm/sDescribe        Arm Pressure +----------+--------+--------+----------------+------------+                           Multiphasic, WNL             +----------+--------+--------+----------------+------------+ +---------+--------+--+--------+--+---------+ VertebralPSV cm/s71EDV cm/s18Antegrade +---------+--------+--+--------+--+---------+  ABI Findings: +---------+------------------+-----+---------+--------+ Right    Rt Pressure  (mmHg)IndexWaveform Comment  +---------+------------------+-----+---------+--------+ Brachial 132                    triphasic         +---------+------------------+-----+---------+--------+ PTA      105               0.80 triphasic         +---------+------------------+-----+---------+--------+ DP       96                0.73 triphasic         +---------+------------------+-----+---------+--------+ Great Toe63                0.48 Abnormal          +---------+------------------+-----+---------+--------+ +---------+------------------+-----+---------+-------+ Left     Lt Pressure (mmHg)IndexWaveform Comment +---------+------------------+-----+---------+-------+ Brachial 130                    triphasic        +---------+------------------+-----+---------+-------+ PTA      100               0.76 biphasic         +---------+------------------+-----+---------+-------+ DP       99                0.75 biphasic         +---------+------------------+-----+---------+-------+ Great Toe59                0.45 Abnormal         +---------+------------------+-----+---------+-------+ +-------+---------------+----------------+ ABI/TBIToday's ABI/TBIPrevious ABI/TBI +-------+---------------+----------------+ Right  0.80/0.48      0.98/0.55        +-------+---------------+----------------+ Left   0.76/0.45      0.99/0.67        +-------+---------------+----------------+  Right Doppler Findings: +--------+--------+-----+---------+--------+ Site    PressureIndexDoppler  Comments +--------+--------+-----+---------+--------+ [id number]          triphasic         +--------+--------+-----+---------+--------+ Radial  triphasic         +--------+--------+-----+---------+--------+ Ulnar                triphasic         +--------+--------+-----+---------+--------+  Left Doppler Findings: +--------+--------+-----+---------+--------+ Site     PressureIndexDoppler  Comments +--------+--------+-----+---------+--------+ [id number]          triphasic         +--------+--------+-----+---------+--------+ Radial               triphasic         +--------+--------+-----+---------+--------+ Ulnar                triphasic         +--------+--------+-----+---------+--------+  Summary: Right Carotid: Velocities in the right ICA are consistent with a 1-39% stenosis. Left Carotid: Velocities in the left ICA are consistent with a 1-39% stenosis. Vertebrals:  Bilateral vertebral arteries demonstrate antegrade flow. Subclavians: Normal flow hemodynamics were seen in bilateral subclavian              arteries. Right ABI: Resting right ankle-brachial index indicates mild right lower extremity arterial disease. The right toe-brachial index is abnormal. Left ABI: Resting left ankle-brachial index indicates moderate left lower extremity arterial disease. The left toe-brachial index is abnormal. Bilateral Extremity: Doppler waveforms remain within normal limits with compression bilaterally for the radial arteries. Doppler waveforms remain within normal limits with compression bilaterally for the ulnar arteries.  Electronically signed by Monica Martinez MD on 08/29/2019 at 6:31:36 PM.    Final    Discharge Instructions    Amb Referral to Cardiac Rehabilitation   Complete by: As directed    Diagnosis: CABG   CABG X ___: 3   After initial evaluation and assessments completed: Virtual Based Care may be provided alone or in conjunction with Phase 2 Cardiac Rehab based on patient barriers.: Yes      Discharge Medications: Allergies as of 09/07/2019      Reactions   Lisinopril Itching, Cough      Medication List    STOP taking these medications   amLODipine 10 MG tablet Commonly known as: NORVASC   losartan 50 MG tablet Commonly known as: COZAAR     TAKE these medications   acetaminophen 500 MG tablet Commonly known as:  TYLENOL Take 2 tablets (1,000 mg total) by mouth every 8 (eight) hours as needed. What changed:   when to take this  reasons to take this   aspirin 325 MG EC tablet Take 1 tablet (325 mg total) by mouth daily. Start taking on: September 08, 2019 What changed:   medication strength  how much to take   Farxiga 5 MG Tabs tablet Generic drug: dapagliflozin propanediol Take 5 mg by mouth daily.   furosemide 40 MG tablet Commonly known as: LASIX Take 1 tablet (40 mg total) by mouth daily. For one week then stop.   lipase/protease/amylase 36000 UNITS Cpep capsule Commonly known as: Creon Take 2 capsules with each meal three times daily  Take 1 capsule with each snack twice daily What changed:   how much to take  how to take this  when to take this   lovastatin 20 MG tablet Commonly known as: MEVACOR TAKE ONE TABLET BY MOUTH ONCE DAILY   metFORMIN 1000 MG tablet Commonly known as: GLUCOPHAGE TAKE 1 TABLET BY MOUTH TWICE DAILY WITH A MEAL What changed: See the new instructions.   Metoprolol Tartrate 37.5 MG Tabs Take 37.5 mg by mouth 2 (two) times  daily. What changed:   medication strength  how much to take   ofloxacin 0.3 % ophthalmic solution Commonly known as: OCUFLOX Place 1 drop into the left eye in the morning, at noon, and at bedtime. For 10 days   omeprazole 40 MG capsule Commonly known as: PRILOSEC TAKE 1 CAPSULE BY MOUTH EVERY DAY What changed: how much to take   OneTouch Delica Plus [id number] Misc 1 APPLICATION BY OTHER ROUTE DAILY. TEST BLOOD SUGAR ONCE A DAY   OneTouch Verio test strip Generic drug: glucose blood USE 1 STRIP TO CHECK GLUCOSE ONCE DAILY   Pen Needles 5/16" 30G X 8 MM Misc Pen needles used for lantus injections   potassium chloride SA 20 MEQ tablet Commonly known as: KLOR-CON Take 1 tablet (20 mEq total) by mouth daily. For one week then stop. Start taking on: September 08, 2019   tobramycin 0.3 % ophthalmic solution Commonly  known as: TOBREX Place 1 drop into the left eye every morning.   traMADol 50 MG tablet Commonly known as: ULTRAM Take 1 tablet (50 mg total) by mouth every 6 (six) hours as needed for moderate pain.   Tyler Aas FlexTouch 100 UNIT/ML FlexTouch Pen Generic drug: insulin degludec Inject 0.1 mLs (10 Units total) into the skin daily after supper.      The patient has been discharged on:   1.Beta Blocker:  Yes [  x ]                              No   [   ]                              If No, reason:  2.Ace Inhibitor/ARB: Yes [   ]                                     No  [ x   ]                                     If No, reason:Labile BP but will consider restarting as an outpatient  3.Statin:   Yes [  x ]                  No  [   ]                  If No, reason:  4.Ecasa:  Yes  [ x  ]                  No   [   ]                  If No, reason:  Follow Up Appointments:  Follow-up Information    Lajuana Matte, MD. Go on 09/12/2019.   Specialty: Cardiothoracic Surgery Why: Appointment time is at 10:45 am Contact information: [place] [address] [phone]        Elouise Munroe, MD. Go on 09/23/2019.   Specialties: Cardiology, Radiology Why: Appointment time is at 11:20 am Contact information: [address] [place] [address] [phone]  SignedSharalyn Ink ZimmermanPA-C 09/07/2019, 11:47 AM

## 2019-09-02 NOTE — Progress Notes (Signed)
  Echocardiogram Echocardiogram Transesophageal has been performed.  Jessica Hart 09/02/2019, 8:23 AM

## 2019-09-02 NOTE — Anesthesia Postprocedure Evaluation (Signed)
Anesthesia Post Note  Patient: NAZIYA HEGWOOD  Procedure(s) Performed: CORONARY ARTERY BYPASS GRAFTING (CABG) TIMES THREE USING LEFT INTERNAL MAMMARY ARTERY AND RIGHT GREATER SAPHENOUS VEIN (N/A Chest) TRANSESOPHAGEAL ECHOCARDIOGRAM (TEE) (N/A ) ENDOVEIN HARVEST OF GREATER SAPHENOUS VEIN (Right Leg Lower)     Patient location during evaluation: ICU Anesthesia Type: General Level of consciousness: sedated and patient remains intubated per anesthesia plan Pain management: pain level controlled Vital Signs Assessment: post-procedure vital signs reviewed and stable Respiratory status: patient remains intubated per anesthesia plan Cardiovascular status: stable Postop Assessment: no apparent nausea or vomiting Anesthetic complications: no   No complications documented.  Last Vitals:  Vitals:   09/02/19 1227 09/02/19 1300  BP: (!) 117/58 103/64  Pulse: 77 77  Resp:  17  Temp:  (!) 35.2 C  SpO2: 94% 94%    Last Pain:  Vitals:   09/02/19 1300  TempSrc: Core  PainSc:                  Audry Pili

## 2019-09-02 NOTE — Transfer of Care (Signed)
Immediate Anesthesia Transfer of Care Note  Patient: Jessica Hart  Procedure(s) Performed: CORONARY ARTERY BYPASS GRAFTING (CABG) TIMES THREE USING LEFT INTERNAL MAMMARY ARTERY AND RIGHT GREATER SAPHENOUS VEIN (N/A Chest) TRANSESOPHAGEAL ECHOCARDIOGRAM (TEE) (N/A ) ENDOVEIN HARVEST OF GREATER SAPHENOUS VEIN (Right Leg Lower)  Patient Location: 2H20  Anesthesia Type:General  Level of Consciousness: sedated and Patient remains intubated per anesthesia plan  Airway & Oxygen Therapy: Patient remains intubated per anesthesia plan  Post-op Assessment: Report given to RN and Post -op Vital signs reviewed and stable  Post vital signs: Reviewed and stable  Last Vitals:  Vitals Value Taken Time  BP 116/50   Temp 35.1 C 09/02/19 1237  Pulse 78 09/02/19 1237  Resp 18 09/02/19 1237  SpO2 94 % 09/02/19 1237  Vitals shown include unvalidated device data.  Last Pain:  Vitals:   09/02/19 0644  PainSc: 0-No pain         Complications: No complications documented.

## 2019-09-02 NOTE — H&P (Signed)
[address]       [place] [address]             [phone]      No events since her clinic appointment OR today for CABG4  Per my last clinic note  Jessica Hart Date of Birth: [birth year]  Referring: Belva Crome, MD Primary Care: Girtha Rm, NP-C Primary Cardiologist:No primary care provider on file.  Chief Complaint:        Chief Complaint  Patient presents with  . 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  Over the past several months she has had progressive exertional angina to the point where she becomes short of breath and has chest pressure simply with washing dishes.  She is not very mobile and that she spends most of the day in her recliner watching television.  She denies any lower extremity swelling or pain at rest.  She is able to lay flat without much pain or shortness of breath.  She does endorse some left leg cramps, at rest.  She is a former smoker quit about 15 years ago.  She has been diagnosed with pancreatic insufficiency has been placed on Creon.   Past Medical and Surgical History: Previous Chest Surgery: None Previous Chest Radiation: No Diabetes Mellitus: Yes.  HbA1C 6.8 Creatinine: 0.9      Past Medical History:  Diagnosis Date  . Arthritis   . Cataract bilaterl 3 years ago  . Diabetes mellitus 02/27/2005  . Diabetes mellitus type 2, controlled, without complications ([facility]) 1/54/0086   Qualifier: Diagnosis of  By: Drucie Ip    . Diverticulosis   . Echocardiogram findings abnormal, without diagnosis 2005   EF 47%, no ischemia, no infarction  . Former smoker 01/10/2017   45 pack year history, quit in 2007   .  GERD (gastroesophageal reflux disease)   . History of bone density study 2006   Lt hip = -1.0, L spine = -1.8   . Hyperlipidemia   . Hypertension          Past Surgical History:  Procedure Laterality Date  . ABDOMINAL HYSTERECTOMY  1989   one ovary remains per patient  . LEFT HEART CATH AND CORONARY ANGIOGRAPHY N/A 08/20/2019   Procedure: LEFT HEART CATH AND CORONARY ANGIOGRAPHY;  Surgeon: Belva Crome, MD;  Location: [facility];  Service: Cardiovascular;  Laterality: N/A;  . TONSILECTOMY, ADENOIDECTOMY, BILATERAL MYRINGOTOMY AND TUBES  1965    Social History: Support: Lives with her husband.  Comes to clinic with her nurse from [place].  Social History       Tobacco Use  Smoking Status Former Smoker  . Packs/day: 1.00  . Years: 45.00  . Pack years: 45.00  . Quit date: 07/27/2005  . Years since quitting: 14.0  Smokeless Tobacco Former Systems developer  . Quit date: 03/2005    Social History       Substance and Sexual Activity  Alcohol Use Yes   Comment: beer occasionally          Allergies  Allergen Reactions  . Lisinopril Itching and Cough    Medications: Asprin: Yes Statin: Yes Beta Blocker: Yes  Ace Inhibitor: No Anti-Coagulation: No        Current Outpatient Medications  Medication Sig Dispense Refill  . acetaminophen (TYLENOL) 500 MG tablet Take 2 tablets (1,000 mg total) by mouth every 8 (eight) hours as needed. (Patient taking differently: Take 1,000 mg by mouth 2 (two) times daily after a meal. ) 30 tablet 2  . amLODipine (NORVASC) 10 MG tablet TAKE 1 TABLET BY MOUTH EVERY DAY (Patient taking differently: Take 10 mg by mouth daily. ) 90 tablet 1  . aspirin EC 81 MG tablet Take 81 mg by mouth daily.    . dapagliflozin propanediol (FARXIGA) 5 MG TABS tablet Take 5 mg by mouth daily. 90 tablet 0  . insulin degludec (TRESIBA FLEXTOUCH) 100 UNIT/ML SOPN FlexTouch Pen Inject 0.1 mLs (10 Units total) into the skin daily after supper. 2  pen 0  . Insulin Pen Needle (PEN NEEDLES 5/16") 30G X 8 MM MISC Pen needles used for lantus injections 100 each 3  . Lancets (ONETOUCH DELICA PLUS [id number]) MISC 1 APPLICATION BY OTHER ROUTE DAILY. TEST BLOOD SUGAR ONCE A DAY 100 each 1  . lipase/protease/amylase (CREON) 36000 UNITS CPEP capsule Take 2 capsules with each meal three times daily  Take 1 capsule with each snack twice daily (Patient taking differently: Take 36,000-72,000 Units by mouth See admin instructions. Take 2 capsules with each meal three times daily  Take 1 capsule with each snack twice daily) 240 capsule 11  . losartan (COZAAR) 50 MG tablet TAKE 1 TABLET BY MOUTH EVERY DAY (Patient taking differently: Take 50 mg by mouth daily. ) 90 tablet 1  . lovastatin (MEVACOR) 20 MG tablet TAKE ONE TABLET BY MOUTH ONCE DAILY (Patient taking differently: Take 20 mg by mouth daily. ) 90 tablet 1  . metFORMIN (GLUCOPHAGE) 1000 MG tablet TAKE 1 TABLET BY MOUTH TWICE DAILY WITH A MEAL 60 tablet 0  . metoprolol tartrate (LOPRESSOR) 100 MG tablet TAKE 1 TABLET BY MOUTH TWICE A DAY (Patient taking differently: Take 100 mg by mouth 2 (two) times daily. ) 180 tablet 0  . ofloxacin (OCUFLOX) 0.3 % ophthalmic solution Place 1 drop into the left eye in the morning, at noon, and at bedtime. For 10 days    . omeprazole (PRILOSEC) 40 MG capsule TAKE 1 CAPSULE BY MOUTH EVERY DAY (Patient taking differently: Take 40 mg by mouth daily. ) 90 capsule 0  . ONETOUCH VERIO test strip USE 1 STRIP TO CHECK GLUCOSE ONCE DAILY 100 strip 1  . tobramycin (TOBREX) 0.3 % ophthalmic solution Place 1 drop into the left eye every morning. Prn for dry eyes     No current facility-administered medications for this visit.    (Not in a hospital admission)        Family History  Problem Relation Age of Onset  . Heart disease Father   . Cancer Sister   . Tongue cancer Sister   . Colon polyps Neg Hx   . Rectal cancer Neg Hx   . Stomach cancer Neg Hx       Review of Systems:   Review of Systems  Constitutional: Negative.   HENT: Negative.   Respiratory: Positive for shortness of breath.   Cardiovascular: Positive for chest pain and claudication. Negative for orthopnea and leg swelling.  Gastrointestinal: Positive for abdominal pain, diarrhea, heartburn, nausea and vomiting.  Musculoskeletal: Negative.   Neurological: Negative.                  Physical  Exam: BP (!) 145/75   Pulse 71   Temp 97.6 F (36.4 C) (Temporal)   Resp 20   Ht _0  (1.6 m)   Wt 170 lb (77.1 kg)   SpO2 92% Comment: RA  BMI 30.11 kg/m  Physical Exam Constitutional:      Appearance: Normal appearance.  HENT:     Head: Normocephalic and atraumatic.  Eyes:     Extraocular Movements: Extraocular movements intact.     Conjunctiva/sclera: Conjunctivae normal.  Cardiovascular:     Rate and Rhythm: Normal rate and regular rhythm.  Pulmonary:     Effort: Pulmonary effort is normal. No respiratory distress.  Abdominal:     General: Abdomen is flat. There is no distension.  Musculoskeletal:        General: Normal range of motion.     Cervical back: Normal range of motion.  Skin:    General: Skin is warm and dry.  Neurological:     General: No focal deficit present.     Mental Status: She is alert.       Diagnostic Studies & Laboratory data:    Left Heart Catherization: She has a completely occluded LAD which fills from left to left collaterals.  Reconstituted vessel appears appropriate for bypass.  She also has a totally occluded RCA which fills from left to right collaterals.  The PDA appears appropriate for bypass.  She has several tandem lesions in her circumflex vessel.  OM1 appears appropriate for bypass.  Echo: Echo from 2019 did not show any significant valvular disease.  She did have a stress echo that was done in June 2021 which showed reduced function.   I have independently reviewed the above radiologic studies and discussed  with the patient   Recent Lab Findings: Recent Labs       Lab Results  Component Value Date   WBC 7.1 08/07/2019   HGB 15.6 08/07/2019   HCT 47.3 (H) 08/07/2019   PLT 323 08/07/2019   GLUCOSE 121 (H) 08/07/2019   CHOL 153 01/29/2019   TRIG 216 (H) 01/29/2019   HDL 38 (L) 01/29/2019   LDLDIRECT 81 03/21/2013   LDLCALC 79 01/29/2019   ALT 11 01/29/2019   AST 15 01/29/2019   NA 141 08/07/2019   K 4.9 08/07/2019   CL 103 08/07/2019   CREATININE 0.98 08/07/2019   BUN 13 08/07/2019   CO2 20 08/07/2019   TSH 0.923 01/29/2019   HGBA1C 6.8 (H) 01/29/2019        Assessment / Plan:   79 year old female with severe three-vessel disease and CTO lesions to the LAD and RCA.  Her stress echo shows that she has an EF that is around 35%.  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  It does not appear to be porcelain but during the time of surgery may need to consider alternate arterial access.  She is agreeable to proceed with a bypass, the risks and benefits have been discussed in detail and she is tentatively scheduled for July 6.

## 2019-09-02 NOTE — Procedures (Signed)
Extubation Procedure Note  Patient Details:   Name: Jessica Hart DOB: 1940-10-06 MRN: [id number]   Airway Documentation:    Vent end date: 09/02/19 Vent end time: 1707   Evaluation  O2 sats: stable throughout Complications: No apparent complications Patient did tolerate procedure well. Bilateral Breath Sounds: Clear, Diminished   Yes    Pt extubated per rapid wean protocol. They had a positive cuff leak, Nif -2mhg VC 5510m. Suctioned pts ETT and Oral airway before extubation. Pt extubated to 4L [place]. No stridor noted.    NaAngelique Holm/07/2019, 5:16 PM

## 2019-09-02 NOTE — Op Note (Signed)
[address]       [place] [address]             [phone]                                          09/02/2019 Patient:  [name] Pre-Op Dx: Coronary artery disease,   Unstable angina   Peripheral vascular disease   Hypertension   Diabetes Post-op Dx: Same Procedure: CABG X 3, LIMA LAD, reverse saphenous vein graft to PDA, and OM1 Endoscopic greater saphenous vein harvest on the right Intra-operative Transesophageal Echocardiogram  Surgeon and Role:      * Arron Mcnaught, Lucile Crater, MD - Primary    *D. Tacy Dura, PA-C- assisting  Anesthesia  general EBL:  500 ml Blood Administration: nonr Xclamp Time:  66 min Pump Time:  41mn  Drains: 127F blake drain: L, mediastinal  Wires: none Counts: correct   Indications:  79year old female with severe three-vessel disease and CTO lesions to the LAD and RCA. Her stress echo shows that she has an EF that is around 35%. 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 It does not appear to be porcelain but during the time of surgery may need to consider alternate arterial access. She is agreeable to proceed with a bypass, the risks and benefits have been discussed in detail .  Findings: Good LIMA, good vein conduit.  The ascending aorta was heavily calcified around the arch, and there was a large plaque in the mid a sending.  For this reason we decided to do this under single cross-clamp.  The LAD was calcified.  But a soft spot was identified.  OM 1 was a good sized vessel.  OM 3 was heavily calcified and small.  The PDA was heavily calcified proximally as well.  There were good flows in all the vein grafts.  Operative Technique: All invasive lines were placed in pre-op holding.  After the risks, benefits and alternatives were thoroughly discussed, the patient was brought to the operative theatre.  Anesthesia was induced, and the patient  was prepped and draped in normal sterile fashion.  An appropriate surgical pause was performed, and pre-operative antibiotics were dosed accordingly.  We began with simultaneous incisions were made along the right leg for harvesting of the greater saphenous vein and the chest for the sternotomy.  In regards to the sternotomy, this was carried down with bovie cautery, and the sternum was divided with a reciprocating saw.  Meticulous hemostasis was obtained.  The left internal thoracic artery was exposed and harvested in in pedicled fashion.  The patient was systemically heparinized, and the artery was divided distally, and placed in a papaverine sponge.    The sternal elevator was removed, and a retractor was placed.  The pericardium was divided in the midline and fashioned into a cradle with pericardial stitches.   After we confirmed an appropriate ACT, the ascending aorta was cannulated in standard fashion.  The right atrial appendage was used for venous cannulation site.  Cardiopulmonary bypass was initiated, and the heart retractor was placed. The cross clamp was applied, and a dose of anterograde cardioplegia was given with good arrest of the heart.  We moved to the posterior wall of the heart, and found a good target on the  PDA.  An arteriotomy was made, and the vein graft was anastomosed to it in an end to side fashion.  Next we exposed the lateral wall, and found a good target on the OM1.  An end to side anastomosis with the vein graft was then created.  Finally, we exposed a good target on the  LAD, and fashioned an end to side anastomosis between it and the LITA.  We began to re-warm.  We then moved to the ascending aorta, and made 2 aortotomies.  We created an end to side anastomosis between it and the proximal vein grafts.  The proximal sites were marked with rings.  A re-animation dose of cardioplegia was given.  The heart was de-aired, and the cross clamp was removed.  Meticulous hemostasis was  obtained.    We separated from cardiopulmonary bypass without event.the heparin was reversed with protamine.  Chest tubes and wires were placed, and the sternum was re-approximated with with sternal wires.  The soft tissue and skin were re-approximated wth absorbable suture.    The patient tolerated the procedure without any immediate complications, and was transferred to the ICU in guarded condition.  Yacine Droz Bary Leriche

## 2019-09-02 NOTE — Brief Op Note (Signed)
09/02/2019  10:59 AM  PATIENT:  Jessica Hart  79 y.o. female  PRE-OPERATIVE DIAGNOSIS:  Coronary artery disease  POST-OPERATIVE DIAGNOSIS:  Coronary artery disease  PROCEDURE: TRANSESOPHAGEAL ECHOCARDIOGRAM (TEE), CORONARY ARTERY BYPASS GRAFTING (CABG) TIMES THREE (LIMA to LAD, SVG to OM, SVG to PDA) USING LEFT INTERNAL MAMMARY ARTERY AND RIGHT GREATER SAPHENOUS VEIN HARVESTED ENDOSCOPICALLY   SURGEON:  Surgeon(s) and Role:    Lightfoot, Lucile Crater, MD - Primary  PHYSICIAN ASSISTANT: Lars Pinks PA-C  ASSISTANTS: Johnette Abraham RNFA  ANESTHESIA:   general  EBL:  Per anesthesia and perfusion record  DRAINS: Chest tubes placed in the mediastinal and pleural spaces   COUNTS CORRECT:  YES  DICTATION: .Dragon Dictation  PLAN OF CARE: Admit to inpatient   PATIENT DISPOSITION:  ICU - intubated and hemodynamically stable.   Delay start of Pharmacological VTE agent (>24hrs) due to surgical blood loss or risk of bleeding: yes  BASELINE WEIGHT: 75.4 kg

## 2019-09-02 NOTE — Discharge Instructions (Signed)
Discharge Instructions:  1. You may shower, please wash incisions daily with soap and water and keep dry.  If you wish to cover wounds with dressing you may do so but please keep clean and change daily.  No tub baths or swimming until incisions have completely healed.  If your incisions become red or develop any drainage please call our office at [phone]  2. No Driving until cleared by Dr. Abran Duke office and you are no longer using narcotic pain medications  3. Monitor your weight daily.. Please use the same scale and weigh at same time... If you gain 5-10 lbs in 48 hours with associated lower extremity swelling, please contact our office at [phone]  4. Fever of 101.5 for at least 24 hours with no source, please contact our office at [phone]  5. Activity- up as tolerated, please walk at least 3 times per day.  Avoid strenuous activity, no lifting, pushing, or pulling with your arms over 8-10 lbs for a minimum of 6 weeks  6. If any questions or concerns arise, please do not hesitate to contact our office at [phone]

## 2019-09-02 NOTE — Anesthesia Procedure Notes (Signed)
Central Venous Catheter Insertion Performed by: Albertha Ghee, MD, anesthesiologist Start/End7/07/2019 6:55 AM, 09/02/2019 7:06 AM Patient location: Pre-op. Preanesthetic checklist: patient identified, IV checked, site marked, risks and benefits discussed, surgical consent, monitors and equipment checked, pre-op evaluation, timeout performed and anesthesia consent Position: Trendelenburg Lidocaine 1% used for infiltration and patient sedated Hand hygiene performed , maximum sterile barriers used  and Seldinger technique used Catheter size: 8.5 Fr Central line and PA cath was placed.Sheath introducer Swan type:thermodilation Procedure performed using ultrasound guided technique. Ultrasound Notes:anatomy identified, needle tip was noted to be adjacent to the nerve/plexus identified, no ultrasound evidence of intravascular and/or intraneural injection and image(s) printed for medical record Attempts: 1 Following insertion, line sutured, dressing applied and Biopatch. Post procedure assessment: blood return through all ports, free fluid flow and no air  Patient tolerated the procedure well with no immediate complications.

## 2019-09-03 ENCOUNTER — Inpatient Hospital Stay (HOSPITAL_COMMUNITY): Payer: Medicare HMO

## 2019-09-03 ENCOUNTER — Encounter (HOSPITAL_COMMUNITY): Payer: Self-pay | Admitting: Thoracic Surgery (Cardiothoracic Vascular Surgery)

## 2019-09-03 LAB — BASIC METABOLIC PANEL
Anion gap: 12 (ref 5–15)
Anion gap: 9 (ref 5–15)
BUN: 7 mg/dL — ABNORMAL LOW (ref 8–23)
BUN: 6 mg/dL — ABNORMAL LOW (ref 8–23)
CO2: 17 mmol/L — ABNORMAL LOW (ref 22–32)
CO2: 23 mmol/L (ref 22–32)
Calcium: 8.2 mg/dL — ABNORMAL LOW (ref 8.9–10.3)
Calcium: 8.7 mg/dL — ABNORMAL LOW (ref 8.9–10.3)
Chloride: 108 mmol/L (ref 98–111)
Chloride: 103 mmol/L (ref 98–111)
Creatinine, Ser: 0.78 mg/dL (ref 0.44–1.00)
Creatinine, Ser: 0.81 mg/dL (ref 0.44–1.00)
GFR calc Af Amer: >60 mL/min (ref >60)
GFR calc Af Amer: >60 mL/min (ref >60)
GFR calc non Af Amer: >60 mL/min (ref >60)
GFR calc non Af Amer: >60 mL/min (ref >60)
Glucose, Bld: 109 mg/dL — ABNORMAL HIGH (ref 70–99)
Glucose, Bld: 144 mg/dL — ABNORMAL HIGH (ref 70–99)
Potassium: 3.8 mmol/L (ref 3.5–5.1)
Potassium: 4.5 mmol/L (ref 3.5–5.1)
Sodium: 137 mmol/L (ref 135–145)
Sodium: 135 mmol/L (ref 135–145)

## 2019-09-03 LAB — CBC
HCT: 35.3 % — ABNORMAL LOW (ref 36.0–46.0)
HCT: 39.2 % (ref 36.0–46.0)
Hemoglobin: 12 g/dL (ref 12.0–15.0)
Hemoglobin: 12.9 g/dL (ref 12.0–15.0)
MCH: 29.6 pg (ref 26.0–34.0)
MCH: 28.7 pg (ref 26.0–34.0)
MCHC: 34 g/dL (ref 30.0–36.0)
MCHC: 32.9 g/dL (ref 30.0–36.0)
MCV: 86.9 fL (ref 80.0–100.0)
MCV: 87.3 fL (ref 80.0–100.0)
Platelets: 244 10*3/uL (ref 150–400)
Platelets: 221 10*3/uL (ref 150–400)
RBC: 4.06 MIL/uL (ref 3.87–5.11)
RBC: 4.49 MIL/uL (ref 3.87–5.11)
RDW: 13.2 % (ref 11.5–15.5)
RDW: 13.8 % (ref 11.5–15.5)
WBC: 12.6 10*3/uL — ABNORMAL HIGH (ref 4.0–10.5)
WBC: 15.5 10*3/uL — ABNORMAL HIGH (ref 4.0–10.5)
nRBC: 0 % (ref 0.0–0.2)
nRBC: 0 % (ref 0.0–0.2)

## 2019-09-03 LAB — GLUCOSE, CAPILLARY
Glucose-Capillary: 105 mg/dL — ABNORMAL HIGH (ref 70–99)
Glucose-Capillary: 107 mg/dL — ABNORMAL HIGH (ref 70–99)
Glucose-Capillary: 108 mg/dL — ABNORMAL HIGH (ref 70–99)
Glucose-Capillary: 110 mg/dL — ABNORMAL HIGH (ref 70–99)
Glucose-Capillary: 112 mg/dL — ABNORMAL HIGH (ref 70–99)
Glucose-Capillary: 118 mg/dL — ABNORMAL HIGH (ref 70–99)
Glucose-Capillary: 118 mg/dL — ABNORMAL HIGH (ref 70–99)
Glucose-Capillary: 123 mg/dL — ABNORMAL HIGH (ref 70–99)
Glucose-Capillary: 129 mg/dL — ABNORMAL HIGH (ref 70–99)
Glucose-Capillary: 138 mg/dL — ABNORMAL HIGH (ref 70–99)
Glucose-Capillary: 140 mg/dL — ABNORMAL HIGH (ref 70–99)
Glucose-Capillary: 142 mg/dL — ABNORMAL HIGH (ref 70–99)
Glucose-Capillary: 166 mg/dL — ABNORMAL HIGH (ref 70–99)
Glucose-Capillary: 76 mg/dL (ref 70–99)
Glucose-Capillary: 77 mg/dL (ref 70–99)
Glucose-Capillary: 91 mg/dL (ref 70–99)

## 2019-09-03 LAB — COOXEMETRY PANEL
Carboxyhemoglobin: 0.9 % (ref 0.5–1.5)
Methemoglobin: 0.8 % (ref 0.0–1.5)
O2 Saturation: 53.9 %
Total hemoglobin: 12.4 g/dL (ref 12.0–16.0)

## 2019-09-03 LAB — MAGNESIUM: Magnesium: 2 mg/dL (ref 1.7–2.4)

## 2019-09-03 MED ORDER — INSULIN DETEMIR 100 UNIT/ML ~~LOC~~ SOLN
8.0000 [IU] | Freq: Every day | SUBCUTANEOUS | Status: AC
  Administered 2019-09-03: 8 [IU] via SUBCUTANEOUS
  Filled 2019-09-03 (×2): qty 0.08

## 2019-09-03 MED ORDER — FUROSEMIDE 10 MG/ML IJ SOLN
40.0000 mg | Freq: Once | INTRAMUSCULAR | Status: AC
  Administered 2019-09-03: 40 mg via INTRAVENOUS
  Filled 2019-09-03: qty 4

## 2019-09-03 MED ORDER — POTASSIUM CHLORIDE CRYS ER 20 MEQ PO TBCR
20.0000 meq | EXTENDED_RELEASE_TABLET | Freq: Once | ORAL | Status: AC
  Administered 2019-09-03: 20 meq via ORAL
  Filled 2019-09-03: qty 1

## 2019-09-03 MED ORDER — MILRINONE LACTATE IN DEXTROSE 20-5 MG/100ML-% IV SOLN
0.1250 ug/kg/min | INTRAVENOUS | Status: DC
  Administered 2019-09-04: 0.125 ug/kg/min via INTRAVENOUS
  Filled 2019-09-03: qty 100

## 2019-09-03 MED ORDER — INSULIN ASPART 100 UNIT/ML ~~LOC~~ SOLN
0.0000 [IU] | SUBCUTANEOUS | Status: DC
  Administered 2019-09-03: 2 [IU] via SUBCUTANEOUS
  Administered 2019-09-03: 4 [IU] via SUBCUTANEOUS
  Administered 2019-09-03 – 2019-09-04 (×3): 2 [IU] via SUBCUTANEOUS

## 2019-09-03 MED ORDER — INSULIN DETEMIR 100 UNIT/ML ~~LOC~~ SOLN
10.0000 [IU] | Freq: Every day | SUBCUTANEOUS | Status: DC
  Administered 2019-09-04 – 2019-09-05 (×2): 10 [IU] via SUBCUTANEOUS
  Filled 2019-09-03 (×3): qty 0.1

## 2019-09-03 NOTE — Progress Notes (Signed)
Instructed patient with flutter at this time. Tolerated well. Let flutter at bedside

## 2019-09-03 NOTE — Progress Notes (Addendum)
TCTS DAILY ICU PROGRESS NOTE                   [address]            [place] [address]          [phone]   1 Day Post-Op Procedure(s) (LRB): CORONARY ARTERY BYPASS GRAFTING (CABG) TIMES THREE USING LEFT INTERNAL MAMMARY ARTERY AND RIGHT GREATER SAPHENOUS VEIN (N/A) TRANSESOPHAGEAL ECHOCARDIOGRAM (TEE) (N/A) ENDOVEIN HARVEST OF GREATER SAPHENOUS VEIN (Right)  Total Length of Stay:  LOS: 1 day   Subjective: Patient alert and awake this am. She has some incisional pain.  Objective: Vital signs in last 24 hours: Temp:  [95.4 F (35.2 C)-99.7 F (37.6 C)] 99.3 F (37.4 C) (07/07 0700) Pulse Rate:  [72-91] 88 (07/07 0700) Cardiac Rhythm: Normal sinus rhythm (07/06 2000) Resp:  [16-28] 20 (07/07 0700) BP: (101-123)/(51-81) 123/58 (07/07 0700) SpO2:  [89 %-96 %] 94 % (07/07 0700) Arterial Line BP: (95-155)/(46-56) 134/46 (07/07 0700) FiO2 (%):  [40 %-50 %] 40 % (07/06 1617) Weight:  [78 kg] 78 kg (07/07 0539)  Filed Weights   09/02/19 0635 09/03/19 0539  Weight: 75.4 kg 78 kg    Weight change: 2.6 kg   Hemodynamic parameters for last 24 hours: PAP: (45-62)/(11-26) 45/13 CO:  [3.5 L/min-6.1 L/min] 6 L/min CI:  [2 L/min/m2-3.4 L/min/m2] 3.4 L/min/m2  Intake/Output from previous day: 07/06 0701 - 07/07 0700 In: 4509.8 [I.V.:2846.5; Blood:270; IV Piggyback:1393.3] Out: 2993 [Urine:3870; Blood:405; Chest Tube:370]  Intake/Output this shift: No intake/output data recorded.  Current Meds: Scheduled Meds: . acetaminophen  1,000 mg Oral Q6H   Or  . acetaminophen (TYLENOL) oral liquid 160 mg/5 mL  1,000 mg Per Tube Q6H  . aspirin EC  325 mg Oral Daily   Or  . aspirin  324 mg Per Tube Daily  . bisacodyl  10 mg Oral Daily   Or  . bisacodyl  10 mg Rectal Daily  . Chlorhexidine Gluconate Cloth  6 each Topical Daily  . docusate sodium  200 mg Oral Daily  . lipase/protease/amylase  36,000 Units Oral With snacks  . lipase/protease/amylase  72,000 Units  Oral TID WC  . mouth rinse  15 mL Mouth Rinse BID  . metoprolol tartrate  12.5 mg Oral BID   Or  . metoprolol tartrate  12.5 mg Per Tube BID  . [START ON 09/04/2019] pantoprazole  40 mg Oral Daily  . pravastatin  20 mg Oral q1800  . sodium chloride flush  10-40 mL Intracatheter Q12H  . sodium chloride flush  3 mL Intravenous Q12H  . tobramycin  1 drop Left Eye q morning - 10a   Continuous Infusions: . sodium chloride Stopped (09/02/19 1842)  . sodium chloride    . sodium chloride 10 mL/hr at 09/02/19 1244  . albumin human    . cefUROXime (ZINACEF)  IV 1.5 g (09/03/19 0302)  . dexmedetomidine (PRECEDEX) IV infusion Stopped (09/02/19 1445)  . famotidine (PEPCID) IV Stopped (09/02/19 1316)  . insulin 1.6 mL/hr at 09/02/19 1900  . lactated ringers    . lactated ringers Stopped (09/02/19 1256)  . lactated ringers 10 mL/hr at 09/02/19 1900  . milrinone 0.25 mcg/kg/min (09/03/19 0022)  . niCARDipine    . nitroGLYCERIN 0 mcg/min (09/02/19 1248)  . norepinephrine (LEVOPHED) Adult infusion 5 mcg/min (09/03/19 0017)  . phenylephrine (NEO-SYNEPHRINE) Adult infusion 0 mcg/min (09/02/19 1248)   PRN Meds:.sodium chloride, albumin human, dextrose, lactated ringers, metoprolol tartrate, midazolam,  morphine injection, ondansetron (ZOFRAN) IV, oxyCODONE, sodium chloride flush, sodium chloride flush, traMADol  General appearance: alert, cooperative and no distress Neurologic: intact Heart: RRR Lungs: Diminished bibasilar breath sounds Abdomen: Soft, non tender, sporadic bowel sounds Extremities: SCDs in place Wound: Sternal and RLE dressings are clean and dry  Lab Results: CBC: Recent Labs    09/02/19 1812 09/02/19 1812 09/02/19 1818 09/03/19 0416  WBC 11.8*  --   --  12.6*  HGB 12.0   < > 12.2 12.0  HCT 36.2   < > 36.0 35.3*  PLT 249  --   --  244   < > = values in this interval not displayed.   BMET:  Recent Labs    09/02/19 1812 09/02/19 1812 09/02/19 1818 09/03/19 0416  NA  137   < > 141 137  K 4.2   < > 4.2 3.8  CL 109  --   --  108  CO2 18*  --   --  17*  GLUCOSE 127*  --   --  109*  BUN 11  --   --  7*  CREATININE 0.73  --   --  0.78  CALCIUM 8.4*  --   --  8.2*   < > = values in this interval not displayed.    CMET: Lab Results  Component Value Date   WBC 12.6 (H) 09/03/2019   HGB 12.0 09/03/2019   HCT 35.3 (L) 09/03/2019   PLT 244 09/03/2019   GLUCOSE 109 (H) 09/03/2019   CHOL 153 01/29/2019   TRIG 216 (H) 01/29/2019   HDL 38 (L) 01/29/2019   LDLDIRECT 81 03/21/2013   LDLCALC 79 01/29/2019   ALT 19 08/29/2019   AST 25 08/29/2019   NA 137 09/03/2019   K 3.8 09/03/2019   CL 108 09/03/2019   CREATININE 0.78 09/03/2019   BUN 7 (L) 09/03/2019   CO2 17 (L) 09/03/2019   TSH 0.923 01/29/2019   INR 1.4 (H) 09/02/2019   HGBA1C 6.7 (H) 08/29/2019   MICROALBUR 3.4 08/29/2016     PT/INR:  Recent Labs    09/02/19 1242  LABPROT 16.4*  INR 1.4*   Radiology: DG Chest Port 1 View  Result Date: 09/02/2019 CLINICAL DATA:  Hypoxia. Status post coronary artery bypass grafting. EXAM: PORTABLE CHEST 1 VIEW COMPARISON:  August 29, 2019 FINDINGS: Patient is status post coronary artery bypass grafting. Endotracheal tube tip is 0.8 cm above the carina. Nasogastric tube tip and side port are in the stomach. Swan-Ganz catheter tip is in the main pulmonary outflow tract. There is a left chest tube and a mediastinal drain. No pneumothorax. There is consolidation in the left base medially with minimal left pleural effusion. There is atelectatic change in the mid lung on the left. Right lung is clear. Heart is upper normal in size with pulmonary vascularity normal. No adenopathy. There is aortic atherosclerosis. No bone lesions. IMPRESSION: Tube and catheter positions as described without pneumothorax. Note that the endotracheal tube tip is near the carina. It may be prudent to consider withdrawing endotracheal tube 2.5-3 cm. Airspace opacity medial left base. Suspect  atelectasis with potential superimposed focus of pneumonia and/or aspiration. Minimal left pleural effusion. There is mild left midlung atelectasis. Right lung clear.  Heart upper normal in size. Aortic Atherosclerosis (ICD10-I70.0). These results will be called to the ordering clinician or representative by the Radiologist Assistant, and communication documented in the PACS or [employer]. Electronically Signed   By: Lowella Grip  III M.D.   On: 09/02/2019 13:23   ECHO INTRAOPERATIVE TEE  Result Date: 09/02/2019  *INTRAOPERATIVE TRANSESOPHAGEAL REPORT *  Patient Name:   Jessica Hart Date of Exam: 09/02/2019 Medical Rec #:  [id number]           Height:       63.0 in Accession #:    [id number]          Weight:       166.2 lb Date of Birth:  [birth year]           BSA:          1.79 m Patient Age:    11 years            BP:           125/70 mmHg Patient Gender: F                   HR:           65 bpm. Exam Location:  Anesthesiology Transesophogeal exam was perform intraoperatively during surgical procedure. Patient was closely monitored under general anesthesia during the entirety of examination. 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 POST-OP IMPRESSIONS - Left Ventricle: The left ventricle is unchanged from pre-bypass. - Right Ventricle: The right ventricle appears unchanged from pre-bypass. - Aorta: The aorta appears unchanged from pre-bypass. - Left Atrium: The left atrium appears unchanged from pre-bypass. - Left Atrial Appendage: The left atrial appendage appears unchanged from pre-bypass. - Aortic Valve: The aortic valve appears unchanged from pre-bypass. - Mitral Valve: The mitral valve appears unchanged from pre-bypass. - Tricuspid Valve:  The tricuspid valve appears unchanged from pre-bypass. - Interatrial Septum: The interatrial septum appears unchanged from pre-bypass. - Interventricular Septum: The interventricular septum appears unchanged from pre-bypass. - Pericardium: The pericardium appears unchanged from pre-bypass. PRE-OP FINDINGS  Left Ventricle: The left ventricle has mild-moderately reduced systolic function, with an ejection fraction of 40-45%. The cavity size was normal. There is mild concentric left ventricular hypertrophy. Left ventricular diffuse hypokinesis. Right Ventricle: The right ventricle has normal systolic function. The cavity was normal. There is no increase in right ventricular wall thickness. Left Atrium: Left atrial size was normal in size. Right Atrium: Right atrial size was normal in size.  Interatrial Septum: No atrial level shunt detected by color flow Doppler. There is left bowing of the interatrial septum, suggestive of elevated right atrial pressure. Pericardium: There is no evidence of pericardial effusion. Mitral Valve: The mitral valve is normal in structure. Mitral valve regurgitation is mild by color flow Doppler. The MR jet is centrally-directed. There is No evidence of mitral stenosis. Tricuspid Valve: The tricuspid valve was normal in structure. Tricuspid valve regurgitation is trivial by color flow Doppler. Aortic Valve: The aortic valve is tricuspid Aortic valve regurgitation was not visualized by color flow Doppler. There is no evidence of aortic valve stenosis. Pulmonic Valve: The pulmonic valve was normal in structure. Pulmonic valve regurgitation is trivial by color flow Doppler. Aorta: The is normal in size and structure. The aortic arch was not well visualized. There is evidence of immobile plaque in the ascending aorta, aortic arch and descending aorta; Grade II,  measuring 2-51m in size. Heavily calcified aorta.  TRenold DonMD Electronically signed by TRenold DonMD Signature Date/Time:  09/02/2019/12:05:21 PM    Final      Assessment/Plan: S/P Procedure(s) (LRB): CORONARY ARTERY BYPASS GRAFTING (CABG) TIMES THREE USING LEFT INTERNAL MAMMARY ARTERY AND RIGHT GREATER SAPHENOUS VEIN (N/A) TRANSESOPHAGEAL ECHOCARDIOGRAM (TEE) (N/A) ENDOVEIN HARVEST OF GREATER SAPHENOUS VEIN (Right)   1. CV-CO/CI 4.9/2.7. SR with HR in the 90's this am. On Milrinone drip. Also, on Lopressor 12.5 mg bid. Check co ox  2. Pulmonary-on 6 liters of oxygen. Wean as able. Chest tubes with 370 cc of output since surgery. Likely transition to bulb suction.  CXR this am appears stable (cardiomegaly, low lung volumes, bibasilar atelectasis. Encourage incentive spirometer and flutter valve. 3. Low grade fever to 99.3. Likely related to SIRS. Will monitor. 4. CBGs 91/112/108. On Insulin drip and will transition off. Will restart Metformin and Farxiga once tolerating oral better and creatinine stable. Pre op HGA1C 6.6 5. Supplement potassium 6. Expected post op blood loss anemia-Ha dn H this am 12 and 35.3 7. Has pancreatic insufficiency-continue Creon 8. Please see progression orders:remove a line, SRowe PavyPA-C 09/03/2019 7:42 AM   Agree with above. Patient doing well. We will decrease milrinone to 0.125 and start diuresis today and assess PA pressures. We will remove arterial line. We will start ambulation today We will transition to sliding scale. Advancing diet, and will ensure the patient is on [place]

## 2019-09-04 ENCOUNTER — Inpatient Hospital Stay (HOSPITAL_COMMUNITY): Payer: Medicare HMO

## 2019-09-04 LAB — CBC
HCT: 35.4 % — ABNORMAL LOW (ref 36.0–46.0)
Hemoglobin: 11.7 g/dL — ABNORMAL LOW (ref 12.0–15.0)
MCH: 29.2 pg (ref 26.0–34.0)
MCHC: 33.1 g/dL (ref 30.0–36.0)
MCV: 88.3 fL (ref 80.0–100.0)
Platelets: 202 10*3/uL (ref 150–400)
RBC: 4.01 MIL/uL (ref 3.87–5.11)
RDW: 14.1 % (ref 11.5–15.5)
WBC: 14.2 10*3/uL — ABNORMAL HIGH (ref 4.0–10.5)
nRBC: 0 % (ref 0.0–0.2)

## 2019-09-04 LAB — GLUCOSE, CAPILLARY
Glucose-Capillary: 100 mg/dL — ABNORMAL HIGH (ref 70–99)
Glucose-Capillary: 107 mg/dL — ABNORMAL HIGH (ref 70–99)
Glucose-Capillary: 109 mg/dL — ABNORMAL HIGH (ref 70–99)
Glucose-Capillary: 121 mg/dL — ABNORMAL HIGH (ref 70–99)
Glucose-Capillary: 141 mg/dL — ABNORMAL HIGH (ref 70–99)
Glucose-Capillary: 93 mg/dL (ref 70–99)

## 2019-09-04 LAB — COOXEMETRY PANEL
Carboxyhemoglobin: 1.4 % (ref 0.5–1.5)
Methemoglobin: 1.3 % (ref 0.0–1.5)
O2 Saturation: 69.8 %
Total hemoglobin: 12 g/dL (ref 12.0–16.0)

## 2019-09-04 LAB — BASIC METABOLIC PANEL
Anion gap: 9 (ref 5–15)
BUN: <5 mg/dL — ABNORMAL LOW (ref 8–23)
CO2: 24 mmol/L (ref 22–32)
Calcium: 8.8 mg/dL — ABNORMAL LOW (ref 8.9–10.3)
Chloride: 104 mmol/L (ref 98–111)
Creatinine, Ser: 0.68 mg/dL (ref 0.44–1.00)
GFR calc Af Amer: >60 mL/min (ref >60)
GFR calc non Af Amer: >60 mL/min (ref >60)
Glucose, Bld: 107 mg/dL — ABNORMAL HIGH (ref 70–99)
Potassium: 4.3 mmol/L (ref 3.5–5.1)
Sodium: 137 mmol/L (ref 135–145)

## 2019-09-04 MED ORDER — FUROSEMIDE 10 MG/ML IJ SOLN
40.0000 mg | Freq: Once | INTRAMUSCULAR | Status: AC
  Administered 2019-09-04: 40 mg via INTRAVENOUS

## 2019-09-04 MED ORDER — METOPROLOL TARTRATE 25 MG PO TABS
25.0000 mg | ORAL_TABLET | Freq: Two times a day (BID) | ORAL | Status: DC
  Administered 2019-09-04 – 2019-09-06 (×5): 25 mg via ORAL
  Filled 2019-09-04 (×6): qty 1

## 2019-09-04 MED ORDER — FUROSEMIDE 10 MG/ML IJ SOLN
40.0000 mg | Freq: Once | INTRAMUSCULAR | Status: AC
  Filled 2019-09-04: qty 4

## 2019-09-04 MED ORDER — METOPROLOL TARTRATE 25 MG/10 ML ORAL SUSPENSION
25.0000 mg | Freq: Two times a day (BID) | ORAL | Status: DC
  Filled 2019-09-04 (×5): qty 10

## 2019-09-04 MED ORDER — POTASSIUM CHLORIDE CRYS ER 20 MEQ PO TBCR
40.0000 meq | EXTENDED_RELEASE_TABLET | Freq: Once | ORAL | Status: AC
  Administered 2019-09-04: 40 meq via ORAL
  Filled 2019-09-04: qty 2

## 2019-09-04 MED ORDER — SODIUM CHLORIDE 0.9% FLUSH
3.0000 mL | Freq: Two times a day (BID) | INTRAVENOUS | Status: DC
  Administered 2019-09-04 – 2019-09-07 (×5): 3 mL via INTRAVENOUS

## 2019-09-04 MED ORDER — SODIUM CHLORIDE 0.9% FLUSH: 3.0000 mL | INTRAVENOUS | Status: DC | PRN

## 2019-09-04 MED ORDER — SODIUM CHLORIDE 0.9 % IV SOLN: 250.0000 mL | INTRAVENOUS | Status: DC | PRN

## 2019-09-04 MED ORDER — INSULIN ASPART 100 UNIT/ML ~~LOC~~ SOLN: 0.0000 [IU] | Freq: Three times a day (TID) | SUBCUTANEOUS | Status: DC

## 2019-09-04 MED ORDER — GUAIFENESIN ER 600 MG PO TB12
600.0000 mg | ORAL_TABLET | Freq: Two times a day (BID) | ORAL | Status: DC
  Administered 2019-09-04 – 2019-09-07 (×7): 600 mg via ORAL
  Filled 2019-09-04 (×7): qty 1

## 2019-09-04 MED ORDER — POTASSIUM CHLORIDE CRYS ER 20 MEQ PO TBCR
20.0000 meq | EXTENDED_RELEASE_TABLET | Freq: Every day | ORAL | Status: DC
  Administered 2019-09-05 – 2019-09-07 (×3): 20 meq via ORAL
  Filled 2019-09-04 (×3): qty 1
  Filled 2019-09-04: qty 2

## 2019-09-04 MED ORDER — ~~LOC~~ CARDIAC SURGERY, PATIENT & FAMILY EDUCATION: Freq: Once | Status: AC

## 2019-09-04 MED ORDER — FUROSEMIDE 40 MG PO TABS
40.0000 mg | ORAL_TABLET | Freq: Two times a day (BID) | ORAL | Status: DC
  Administered 2019-09-04 – 2019-09-07 (×7): 40 mg via ORAL
  Filled 2019-09-04 (×7): qty 1

## 2019-09-04 NOTE — Progress Notes (Signed)
Mobility Specialist: Progress Note    09/04/19 1631  Mobility  Activity Ambulated in hall  Level of Assistance Contact guard assist, steadying assist  Assistive Device Front wheel walker  Distance Ambulated (ft) 300 ft  Mobility Response Tolerated well  Mobility performed by Mobility specialist  $Mobility charge 1 Mobility   Pre-Mobility: 96 HR, 130/65 BP, 93% SpO2 During Mobility: 103 HR, 89% SpO2 Post-Mobility: 105 HR, 147/64 BP, 98% SpO2  Pt c/o of SOB during walk. Pt started on 4L/min O2 and her sats dropped to 84% while ambulating. I bumped her O2 up to 6L/min which only brought her sats up to 89% so I had to put her on 8L/min O2 and her sats came back up to 98%. Pt said she felt better after sitting down.   [name] Brynja Marker Mobility Specialist

## 2019-09-04 NOTE — Progress Notes (Signed)
Report called to [id number]. Patient with no complaints at the current time. Will transfer via [place].

## 2019-09-04 NOTE — Progress Notes (Addendum)
TCTS DAILY ICU PROGRESS NOTE                   [address]            [place] [address]          [phone]   2 Days Post-Op Procedure(s) (LRB): CORONARY ARTERY BYPASS GRAFTING (CABG) TIMES THREE USING LEFT INTERNAL MAMMARY ARTERY AND RIGHT GREATER SAPHENOUS VEIN (N/A) TRANSESOPHAGEAL ECHOCARDIOGRAM (TEE) (N/A) ENDOVEIN HARVEST OF GREATER SAPHENOUS VEIN (Right)  Total Length of Stay:  LOS: 2 days   Subjective: Patient eating breakfast. She states she feels she is doing "ok"  Objective: Vital signs in last 24 hours: Temp:  [97.7 F (36.5 C)-99.7 F (37.6 C)] 98.7 F (37.1 C) (07/08 0500) Pulse Rate:  [82-98] 90 (07/08 0600) Cardiac Rhythm: Normal sinus rhythm (07/08 0400) Resp:  [13-33] 25 (07/08 0600) BP: (73-131)/(56-76) 114/56 (07/08 0600) SpO2:  [91 %-97 %] 95 % (07/08 0600) Arterial Line BP: (128-158)/(44-58) 128/44 (07/07 1430) Weight:  [73.8 kg] 73.8 kg (07/08 0500)  Filed Weights   09/02/19 0635 09/03/19 0539 09/04/19 0500  Weight: 75.4 kg 78 kg 73.8 kg    Weight change: -4.2 kg   Hemodynamic parameters for last 24 hours: PAP: (42-56)/(14-20) 44/15 CO:  [4.3 L/min-4.9 L/min] 4.3 L/min CI:  [2.4 L/min/m2-2.7 L/min/m2] 2.4 L/min/m2  Intake/Output from previous day: 07/07 0701 - 07/08 0700 In: 1281.5 [P.O.:600; I.V.:481.4; IV Piggyback:200.1] Out: 2700 [Urine:2560; Chest Tube:140]  Intake/Output this shift: No intake/output data recorded.  Current Meds: Scheduled Meds: . acetaminophen  1,000 mg Oral Q6H   Or  . acetaminophen (TYLENOL) oral liquid 160 mg/5 mL  1,000 mg Per Tube Q6H  . aspirin EC  325 mg Oral Daily   Or  . aspirin  324 mg Per Tube Daily  . bisacodyl  10 mg Oral Daily   Or  . bisacodyl  10 mg Rectal Daily  . Chlorhexidine Gluconate Cloth  6 each Topical Daily  . docusate sodium  200 mg Oral Daily  . insulin aspart  0-24 Units Subcutaneous Q4H  . insulin detemir  10 Units Subcutaneous Daily  .  lipase/protease/amylase  36,000 Units Oral With snacks  . lipase/protease/amylase  72,000 Units Oral TID WC  . mouth rinse  15 mL Mouth Rinse BID  . metoprolol tartrate  12.5 mg Oral BID   Or  . metoprolol tartrate  12.5 mg Per Tube BID  . pantoprazole  40 mg Oral Daily  . pravastatin  20 mg Oral q1800  . sodium chloride flush  10-40 mL Intracatheter Q12H  . sodium chloride flush  3 mL Intravenous Q12H  . tobramycin  1 drop Left Eye q morning - 10a   Continuous Infusions: . sodium chloride Stopped (09/03/19 1447)  . sodium chloride    . sodium chloride 10 mL/hr at 09/02/19 1244  . lactated ringers    . lactated ringers Stopped (09/02/19 1256)  . lactated ringers 10 mL/hr at 09/04/19 0500  . milrinone 0.125 mcg/kg/min (09/04/19 0500)  . niCARDipine    . nitroGLYCERIN 0 mcg/min (09/02/19 1248)  . norepinephrine (LEVOPHED) Adult infusion Stopped (09/03/19 0749)  . phenylephrine (NEO-SYNEPHRINE) Adult infusion 0 mcg/min (09/02/19 1248)   PRN Meds:.sodium chloride, dextrose, lactated ringers, metoprolol tartrate, midazolam, morphine injection, ondansetron (ZOFRAN) IV, oxyCODONE, sodium chloride flush, sodium chloride flush, traMADol  General appearance: Sitting in chair, no distress Neurologic: intact Heart: RRR Lungs: Diminished bibasilar breath sounds Abdomen: Soft, non tender, sporadic bowel  sounds Extremities: Mild LE edema Wound: Sternal dressings is clean and dry. RLE wound is clean and dry  Lab Results: CBC: Recent Labs    09/03/19 1641 09/04/19 0500  WBC 15.5* 14.2*  HGB 12.9 11.7*  HCT 39.2 35.4*  PLT 221 202   BMET:  Recent Labs    09/03/19 1641 09/04/19 0500  NA 135 137  K 4.5 4.3  CL 103 104  CO2 23 24  GLUCOSE 144* 107*  BUN 6* <5*  CREATININE 0.81 0.68  CALCIUM 8.7* 8.8*    CMET: Lab Results  Component Value Date   WBC 14.2 (H) 09/04/2019   HGB 11.7 (L) 09/04/2019   HCT 35.4 (L) 09/04/2019   PLT 202 09/04/2019   GLUCOSE 107 (H) 09/04/2019    CHOL 153 01/29/2019   TRIG 216 (H) 01/29/2019   HDL 38 (L) 01/29/2019   LDLDIRECT 81 03/21/2013   LDLCALC 79 01/29/2019   ALT 19 08/29/2019   AST 25 08/29/2019   NA 137 09/04/2019   K 4.3 09/04/2019   CL 104 09/04/2019   CREATININE 0.68 09/04/2019   BUN <5 (L) 09/04/2019   CO2 24 09/04/2019   TSH 0.923 01/29/2019   INR 1.4 (H) 09/02/2019   HGBA1C 6.7 (H) 08/29/2019   MICROALBUR 3.4 08/29/2016     PT/INR:  Recent Labs    09/02/19 1242  LABPROT 16.4*  INR 1.4*   Radiology: DG CHEST PORT 1 VIEW  Result Date: 09/04/2019 CLINICAL DATA:  Pleural effusion. EXAM: PORTABLE CHEST 1 VIEW COMPARISON:  09/03/2019. FINDINGS: Interval removal of Swan-Ganz catheter. Right IJ sheath in stable position. Mediastinal drainage catheter left chest tube in stable position. No pneumothorax. Prior CABG. Stable cardiomegaly. Increase interstitial prominence. Interstitial edema cannot be excluded. Pneumonitis can not be excluded. Persistent left mid and left base subsegmental atelectasis/infiltrates. No prominent pleural effusion. IMPRESSION: 1. Interim removal Swan-Ganz catheter. Remaining lines and tubes including left chest tube in stable position. No pneumothorax. 2. Prior CABG. Stable cardiomegaly. Increase interstitial prominence noted bilaterally suggesting mild interstitial edema. 3. Persistent left mid and left base subsegmental atelectasis/infiltrates. Electronically Signed   By: Marcello Moores  Register   On: 09/04/2019 05:58     Assessment/Plan: S/P Procedure(s) (LRB): CORONARY ARTERY BYPASS GRAFTING (CABG) TIMES THREE USING LEFT INTERNAL MAMMARY ARTERY AND RIGHT GREATER SAPHENOUS VEIN (N/A) TRANSESOPHAGEAL ECHOCARDIOGRAM (TEE) (N/A) ENDOVEIN HARVEST OF GREATER SAPHENOUS VEIN (Right)   1. CV-Slightly tachy with HR in the low 100's this am. On Milrinone drip. Also, on Lopressor 12.5 mg bid. Co ox this am 69.8 so hope to stop Milrinone. Will increase Lopressor for better HR control. 2. Pulmonary-on 6  liters of oxygen. Wean as able. Chest tubes with 140 cc of output last 24 hours. Hope to remove chest tubes. CXR this am appears stable (cardiomegaly, low lung volumes, left basilar atelectasis, mild interstitial edema). Encourage incentive spirometer and flutter valve. Mucinex for cough 4. CBGs 109/93/107. On Insulin drip and will transition off. Will restart Metformin and Farxiga once tolerating oral better and creatinine stable. Pre op HGA1C 6.6 5. Expected post op blood loss anemia-H and H this am 11.7 and 35.4 6. Has pancreatic insufficiency-continue Creon 7. If Milrinone stopped, would like to remove right IJ but does not have peripheral yet. Will discuss with Dr. Kipp Brood 8. Remove foley 9. Volume overload-diurese 10. May be able to transfer to [place] PA-C 09/04/2019 7:22 AM    Agree with above Doing well Will remove CT and foley Will  d/c milr Will transfer to floor  [place]

## 2019-09-04 NOTE — Progress Notes (Signed)
CARDIAC REHAB PHASE I   PRE:  Rate/Rhythm: 92 SR    BP: sitting  119/60    SaO2: 91 3L  MODE:  Ambulation: 380 ft   POST:  Rate/Rhythm: 105ST    BP: sitting 111/70     SaO2: 86 6L, up to 91 4L   Pt stood with min assist and ambulated with EVA, assist x2 standby. Pt began on 2L O2 but eventually increased to 6L. SAO2 stayed 86 6L for most of walk. Pt not overtly SOB. Steady. Return to recliner. SaO2 increased to 90 4L with rest. Practiced IS, 500 mL. Will f/u as x1. [place], ACSM 09/04/2019 1:52 PM

## 2019-09-05 ENCOUNTER — Inpatient Hospital Stay (HOSPITAL_COMMUNITY): Payer: Medicare HMO

## 2019-09-05 LAB — BASIC METABOLIC PANEL
Anion gap: 10 (ref 5–15)
BUN: 11 mg/dL (ref 8–23)
CO2: 26 mmol/L (ref 22–32)
Calcium: 9.1 mg/dL (ref 8.9–10.3)
Chloride: 100 mmol/L (ref 98–111)
Creatinine, Ser: 0.9 mg/dL (ref 0.44–1.00)
GFR calc Af Amer: >60 mL/min (ref >60)
GFR calc non Af Amer: >60 mL/min (ref >60)
Glucose, Bld: 101 mg/dL — ABNORMAL HIGH (ref 70–99)
Potassium: 4.3 mmol/L (ref 3.5–5.1)
Sodium: 136 mmol/L (ref 135–145)

## 2019-09-05 LAB — CBC
HCT: 38.7 % (ref 36.0–46.0)
Hemoglobin: 12.6 g/dL (ref 12.0–15.0)
MCH: 28.6 pg (ref 26.0–34.0)
MCHC: 32.6 g/dL (ref 30.0–36.0)
MCV: 88 fL (ref 80.0–100.0)
Platelets: 233 10*3/uL (ref 150–400)
RBC: 4.4 MIL/uL (ref 3.87–5.11)
RDW: 14.1 % (ref 11.5–15.5)
WBC: 14.2 10*3/uL — ABNORMAL HIGH (ref 4.0–10.5)
nRBC: 0 % (ref 0.0–0.2)

## 2019-09-05 LAB — GLUCOSE, CAPILLARY
Glucose-Capillary: 91 mg/dL (ref 70–99)
Glucose-Capillary: 172 mg/dL — ABNORMAL HIGH (ref 70–99)

## 2019-09-05 MED ORDER — INSULIN ASPART 100 UNIT/ML ~~LOC~~ SOLN
0.0000 [IU] | Freq: Three times a day (TID) | SUBCUTANEOUS | Status: DC
  Administered 2019-09-05: 2 [IU] via SUBCUTANEOUS
  Administered 2019-09-05: 4 [IU] via SUBCUTANEOUS

## 2019-09-05 MED ORDER — LACTULOSE 10 GM/15ML PO SOLN
20.0000 g | Freq: Once | ORAL | Status: AC
  Administered 2019-09-05: 20 g via ORAL
  Filled 2019-09-05: qty 30

## 2019-09-05 MED ORDER — ENOXAPARIN SODIUM 40 MG/0.4ML ~~LOC~~ SOLN
40.0000 mg | SUBCUTANEOUS | Status: DC
  Administered 2019-09-05 – 2019-09-06 (×2): 40 mg via SUBCUTANEOUS
  Filled 2019-09-05 (×2): qty 0.4

## 2019-09-05 NOTE — Progress Notes (Signed)
CARDIAC REHAB PHASE I   PRE:  Rate/Rhythm: 102 ST  BP:  Sitting: 129/80      SaO2: 95 4L  MODE:  Ambulation: 310 ft   POST:  Rate/Rhythm: 117 ST  BP:  Sitting: 152/74    SaO2: 84 4L --> 88 6L --> 90 4L with rest   Pt ambulated 373f in hallway standby assist with front wheel walker. Pt took several short standing rest breaks c/o SOB. Pt coached through purse lipped breathing. Pt returned to recliner. Call bell and phone within reach. Encouraged continued ambulation and IS use. Will continue to follow.  [id number] Falco RN BSN 09/05/2019 9:01 AM

## 2019-09-05 NOTE — Progress Notes (Signed)
[address]       [place], [address]             [phone]        3 Days Post-Op Procedure(s) (LRB): CORONARY ARTERY BYPASS GRAFTING (CABG) TIMES THREE USING LEFT INTERNAL MAMMARY ARTERY AND RIGHT GREATER SAPHENOUS VEIN (N/A) TRANSESOPHAGEAL ECHOCARDIOGRAM (TEE) (N/A) ENDOVEIN HARVEST OF GREATER SAPHENOUS VEIN (Right)  Subjective: Patient about to get up to use the bathroom. She has no specific complaint this am. She has not had a bowel movement yet.  Objective: Vital signs in last 24 hours: Temp:  [98 F (36.7 C)-98.4 F (36.9 C)] 98.4 F (36.9 C) (07/09 0413) Pulse Rate:  [88-102] 88 (07/09 0413) Cardiac Rhythm: Sinus tachycardia (07/08 2000) Resp:  [18-25] 18 (07/09 0413) BP: (107-157)/(62-85) 118/62 (07/09 0413) SpO2:  [92 %-98 %] 96 % (07/09 0413)  Pre op weight 75.4 kg Current Weight  09/04/19 73.8 kg       Intake/Output from previous day: 07/08 0701 - 07/09 0700 In: 881.4 [P.O.:840; I.V.:41.4] Out: 2985 [Urine:2975; Chest Tube:10]   Physical Exam:  Cardiovascular: Slightly tachycardic Pulmonary: Diminished bibasilar breath sounds Abdomen: Soft, non tender, bowel sounds present. Extremities: Mild bilateral lower extremity edema. Wounds: Clean and dry.  No erythema or signs of infection.  Lab Results: CBC: Recent Labs    09/04/19 0500 09/05/19 0352  WBC 14.2* 14.2*  HGB 11.7* 12.6  HCT 35.4* 38.7  PLT 202 233   BMET:  Recent Labs    09/04/19 0500 09/05/19 0352  NA 137 136  K 4.3 4.3  CL 104 100  CO2 24 26  GLUCOSE 107* 101*  BUN <5* 11  CREATININE 0.68 0.90  CALCIUM 8.8* 9.1    PT/INR:  Lab Results  Component Value Date   INR 1.4 (H) 09/02/2019   INR 1.0 08/29/2019   ABG:  INR: Will add last result for INR, ABG once components are confirmed Will add last 4 CBG results once components are confirmed  Assessment/Plan:  1. CV-Slightly tachy with HR in the low 100's this am. On Lopressor 25 mg bid but  with SBP in the 110's, will not increase Lopressor at this time. Will montior 2. Pulmonary-on 8 liters of oxygen. Wean as able. She has had slight increase in oxygen requirements likely secondary to volume overload.  CXR this am appears stable (cardiomegaly, low lung volumes, bibasilar atelectasis, and small pleural effusions). Encourage incentive spirometer and flutter valve. Mucinex for cough 4. CBGs 100/141/121. Will restart Metformin and Farxiga in am. Pre op HGA1C 6.6 5. Expected post op blood loss anemia resolved as H and H 12.6 and 38.7. 6. Has pancreatic insufficiency-continue Creon 7. Volume overload-on Lasix 40 mg bid. I doubt weight is correct so continue with diuresis 8. Will start Lovenox for DVT prophylaxis 9. LOC constipation  Sayler Mickiewicz M ZimmermanPA-C 09/05/2019,7:01 AM

## 2019-09-05 NOTE — Progress Notes (Signed)
Mobility Specialist: Progress Note    09/05/19 1518  Mobility  Activity Ambulated in hall  Level of Assistance Minimal assist, patient does 75% or more  Assistive Device Front wheel walker  Distance Ambulated (ft) 180 ft  Mobility Response Tolerated well  Mobility performed by Mobility specialist  $Mobility charge 1 Mobility   Pre-Mobility: 105 HR, 111/66 BP, 93% SpO2 During Mobility: 119 HR, 93% SpO2 Post-Mobility: 107 HR, 117/72 BP, 94% SpO2  Pt maintained 93% SpO2 during ambulation. Pt stopped to take one rest break halfway through walk.   [facility] Jordie Skalsky Mobility Specialist

## 2019-09-05 NOTE — Plan of Care (Signed)
  Problem: Education: Goal: Knowledge of General Education information will improve Description: Including pain rating scale, medication(s)/side effects and non-pharmacologic comfort measures Outcome: Progressing   Problem: Health Behavior/Discharge Planning: Goal: Ability to manage health-related needs will improve Outcome: Progressing

## 2019-09-05 NOTE — Care Management Important Message (Signed)
Important Message  Patient Details  Name: JHOANA UPHAM MRN: [id number] Date of Birth: 01/02/1941   Medicare Important Message Given:  Yes     Shelda Altes 09/05/2019, 8:26 AM

## 2019-09-05 NOTE — Plan of Care (Signed)
  Problem: Education: Goal: Knowledge of General Education information will improve Description: Including pain rating scale, medication(s)/side effects and non-pharmacologic comfort measures Outcome: Progressing

## 2019-09-06 LAB — GLUCOSE, CAPILLARY
Glucose-Capillary: 157 mg/dL — ABNORMAL HIGH (ref 70–99)
Glucose-Capillary: 91 mg/dL (ref 70–99)
Glucose-Capillary: 114 mg/dL — ABNORMAL HIGH (ref 70–99)
Glucose-Capillary: 117 mg/dL — ABNORMAL HIGH (ref 70–99)
Glucose-Capillary: 126 mg/dL — ABNORMAL HIGH (ref 70–99)

## 2019-09-06 MED ORDER — METOPROLOL TARTRATE 25 MG PO TABS
37.5000 mg | ORAL_TABLET | Freq: Two times a day (BID) | ORAL | Status: DC
  Administered 2019-09-06 – 2019-09-07 (×2): 37.5 mg via ORAL
  Filled 2019-09-06 (×2): qty 1

## 2019-09-06 MED ORDER — LOSARTAN POTASSIUM 25 MG PO TABS: 25.0000 mg | ORAL_TABLET | Freq: Every day | ORAL | Status: DC

## 2019-09-06 MED ORDER — METOPROLOL TARTRATE 25 MG/10 ML ORAL SUSPENSION
37.5000 mg | Freq: Two times a day (BID) | ORAL | Status: DC
  Filled 2019-09-06 (×3): qty 15

## 2019-09-06 MED ORDER — METOPROLOL TARTRATE 12.5 MG HALF TABLET
12.5000 mg | ORAL_TABLET | Freq: Once | ORAL | Status: AC
  Administered 2019-09-06: 12.5 mg via ORAL
  Filled 2019-09-06: qty 1

## 2019-09-06 MED ORDER — METFORMIN HCL 500 MG PO TABS
500.0000 mg | ORAL_TABLET | Freq: Two times a day (BID) | ORAL | Status: DC
  Administered 2019-09-06 – 2019-09-07 (×3): 500 mg via ORAL
  Filled 2019-09-06 (×3): qty 1

## 2019-09-06 NOTE — Progress Notes (Signed)
CARDIAC REHAB PHASE I   PRE:  Rate/Rhythm: 112-115 ST    BP: sitting 126/69    SaO2: 94 2L rest/ 91 RA rest  MODE:  Ambulation: 200 ft   POST:  Rate/Rhythm: 146 max with ambulation/ 108-124 ST with frequent PACs at rest post walk    BP: sitting 139/69     SaO2: 92 2L  [id number] Patient sitting in chair upon arrival, 2 L O2. O2 removed and patient sats decreased to 91 sitting, 88 standing. O2 reinitiated at 2L with sats increased to 92 standing. Ambulated in hallway with RW. O2 saturations decreased to 86% on 2L. Recovered with standing rest break. Patient c/o SOB, "feeling hot" and mild diaphoresis. HR increased to 146. Back to chair post walk. VS recovered (see above). Patient denied all complaints once returned to sitting. Discharge education completed including review of OHS booklet, restrictions, incisional care, activity progression, diet, and phase 2 CR. Patient request referral to phase 2 be placed to [facility]. Patient also instructed and observed using IS. Encouraged patient to use 10 x per hour to potentially decrease need for O2. Patient verbalized understanding. Will follow up on Monday if not discharged home tomorrow.  Kavya Haag Minus Breeding RN, BSN

## 2019-09-06 NOTE — Progress Notes (Signed)
Mobility Specialist: Progress Note    09/06/19 1414  Mobility  Activity Ambulated in hall  Level of Assistance Modified independent, requires aide device or extra time  Assistive Device Front wheel walker  Distance Ambulated (ft) 150 ft  Mobility Response Tolerated well  Mobility performed by Mobility specialist  $Mobility charge 1 Mobility   Pre-Mobility: 97 HR, 99/58 BP, 97% SpO2 Post-Mobility: 105 HR, 123/67 BP, 92% SpO2   Psychologist, clinical

## 2019-09-06 NOTE — Progress Notes (Addendum)
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 She is not sleeping well;otherwise, no complaints  Objective: Vital signs in last 24 hours: Temp:  [98.1 F (36.7 C)-98.8 F (37.1 C)] 98.4 F (36.9 C) (07/10 0335) Pulse Rate:  [95-100] 98 (07/10 0500) Cardiac Rhythm: Normal sinus rhythm (07/09 2000) Resp:  [19-26] 19 (07/10 0500) BP: (116-152)/(68-80) 134/79 (07/10 0335) SpO2:  [93 %-97 %] 96 % (07/10 0500) Weight:  [73.6 kg] 73.6 kg (07/10 0500)  Pre op weight 75.4 kg Current Weight  09/06/19 73.6 kg       Intake/Output from previous day: 07/09 0701 - 07/10 0700 In: 240 [P.O.:240] Out: 900 [Urine:900]   Physical Exam:  Cardiovascular: RRR Pulmonary: Slightly diminished bibasilar breath sounds Abdomen: Soft, non tender, bowel sounds present. Extremities: Trace bilateral lower extremity edema. Wounds: Clean and dry.  No erythema or signs of infection.  Lab Results: CBC: Recent Labs    09/04/19 0500 09/05/19 0352  WBC 14.2* 14.2*  HGB 11.7* 12.6  HCT 35.4* 38.7  PLT 202 233   BMET:  Recent Labs    09/04/19 0500 09/05/19 0352  NA 137 136  K 4.3 4.3  CL 104 100  CO2 24 26  GLUCOSE 107* 101*  BUN <5* 11  CREATININE 0.68 0.90  CALCIUM 8.8* 9.1    PT/INR:  Lab Results  Component Value Date   INR 1.4 (H) 09/02/2019   INR 1.0 08/29/2019   ABG:  INR: Will add last result for INR, ABG once components are confirmed Will add last 4 CBG results once components are confirmed  Assessment/Plan:  1. CV-Slightly tachy at times with HR in the low 100's this am. On Lopressor 25 mg bid. Will consider restarting low  dose Cozaar in am 2. Pulmonary-on 2 liters of oxygen. Continue to wean as able. Nurse will document oxygen saturation as she was desatting with ambulation. She may need home oxygen when ready for discharge. Encourage incentive spirometer and flutter valve. Mucinex for cough 4. CBGs 157/172/91.  Stop scheduled Insulin. Will restart low dose Metformin this am then [place] at discharge. Pre op HGA1C 6.6 5. Expected post op blood loss anemia resolved as H and H 12.6 and 38.7. 6. Has pancreatic insufficiency-continue Creon 7. Volume overload-on Lasix 40 mg bid. I doubt weight is correct so continue with diuresis 8. Continue Lovenox for DVT prophylaxis 9. Possible discharge 1-2 days  Donielle M ZimmermanPA-C 09/06/2019,8:08 AM   Agree with above. Doing well, sinus tach this morning after ambulation Beta-blocker increased Continuing diuresis Will likely need supplemental oxygen once discharge. Dispo planning.  [place] Trost Bary Leriche

## 2019-09-06 NOTE — Progress Notes (Signed)
Nurse notified me her heart rate increased into the 140's when getting up to ambulate this am. I increased her Lopressor to 37.5 mg bid. Monitor and hope to avoid a fib

## 2019-09-07 LAB — GLUCOSE, CAPILLARY
Glucose-Capillary: 110 mg/dL — ABNORMAL HIGH (ref 70–99)
Glucose-Capillary: 118 mg/dL — ABNORMAL HIGH (ref 70–99)

## 2019-09-07 MED ORDER — TRAMADOL HCL 50 MG PO TABS: 50.0000 mg | ORAL_TABLET | Freq: Four times a day (QID) | ORAL | 0 refills | Status: DC | PRN

## 2019-09-07 MED ORDER — METOPROLOL TARTRATE 37.5 MG PO TABS: 37.5000 mg | ORAL_TABLET | Freq: Two times a day (BID) | ORAL | 1 refills | Status: DC

## 2019-09-07 MED ORDER — ASPIRIN 325 MG PO TBEC: 325.0000 mg | DELAYED_RELEASE_TABLET | Freq: Every day | ORAL | 0 refills | Status: DC

## 2019-09-07 MED ORDER — FUROSEMIDE 40 MG PO TABS: 40.0000 mg | ORAL_TABLET | Freq: Every day | ORAL | 0 refills | Status: DC

## 2019-09-07 MED ORDER — ACETAMINOPHEN 500 MG PO TABS: 1000.0000 mg | ORAL_TABLET | Freq: Three times a day (TID) | ORAL | 2 refills | Status: AC | PRN

## 2019-09-07 MED ORDER — POTASSIUM CHLORIDE CRYS ER 20 MEQ PO TBCR: 20.0000 meq | EXTENDED_RELEASE_TABLET | Freq: Every day | ORAL | 0 refills | Status: DC

## 2019-09-07 NOTE — Progress Notes (Signed)
Mobility Specialist: Progress Note    09/07/19 1115  Mobility  Activity Refused mobility  Mobility performed by Mobility specialist   Pt refused mobility. Pt said she is discharging soon.   [facility] Paulino Cork Mobility Specialist

## 2019-09-07 NOTE — Progress Notes (Signed)
Patient ambulated 364f  in hallway with rolling walker and on room air, patient tolerated well.  O2 sats of 88-100%.

## 2019-09-07 NOTE — Plan of Care (Signed)
DISCHARGE NOTE HOME Jessica Hart to be discharged home per MD order. Discussed prescriptions and follow up appointments with the patient. Prescriptions given to patient; medication list explained in detail. Patient verbalized understanding.  Skin clean, dry and intact without evidence of skin break down, no evidence of skin tears noted. IV catheter discontinued intact. Site without signs and symptoms of complications. Dressing and pressure applied. Pt denies pain at the site currently. No complaints noted.  Patient free of lines, drains, and wounds.   An After Visit Summary (AVS) was printed and given to the patient. Patient escorted via wheelchair, and discharged home via private auto.  Stephan Minister, RN

## 2019-09-07 NOTE — Progress Notes (Addendum)
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 She hopes to go home today.  Objective: Vital signs in last 24 hours: Temp:  [98.3 F (36.8 C)-99.1 F (37.3 C)] 99.1 F (37.3 C) (07/11 0539) Pulse Rate:  [80-110] 98 (07/11 0539) Cardiac Rhythm: Normal sinus rhythm (07/11 0735) Resp:  [16-20] 18 (07/11 0539) BP: (103-140)/(63-73) 128/70 (07/11 0539) SpO2:  [93 %-98 %] 93 % (07/11 0539) Weight:  [71.7 kg] 71.7 kg (07/11 0300)  Pre op weight 75.4 kg Current Weight  09/07/19 71.7 kg       Intake/Output from previous day: 07/10 0701 - 07/11 0700 In: 480 [P.O.:480] Out: -    Physical Exam:  Cardiovascular: RRR Pulmonary: Slightly diminished bibasilar breath sounds Abdomen: Soft, non tender, bowel sounds present. Extremities: Trace bilateral lower extremity edema. Wounds: Clean and dry.  No erythema or signs of infection.  Lab Results: CBC: Recent Labs    09/05/19 0352  WBC 14.2*  HGB 12.6  HCT 38.7  PLT 233   BMET:  Recent Labs    09/05/19 0352  NA 136  K 4.3  CL 100  CO2 26  GLUCOSE 101*  BUN 11  CREATININE 0.90  CALCIUM 9.1    PT/INR:  Lab Results  Component Value Date   INR 1.4 (H) 09/02/2019   INR 1.0 08/29/2019   ABG:  INR: Will add last result for INR, ABG once components are confirmed Will add last 4 CBG results once components are confirmed  Assessment/Plan:  1. CV-Tachycardia at times with ambulation. On Lopressor 37.5 mg bid.  2. Pulmonary-on room air this am. Patient's oxygenation briefly decreased to 88% with ambulation and came back up to over 91% on room air . She will not need  home oxygen. Encourage incentive spirometer and flutter valve. Mucinex for cough 4. CBGs 177/126/110.  On low dose Metformin and will restart Farxiga at discharge. Pre op HGA1C 6.6 5. Has pancreatic insufficiency-continue Creon 6. Volume overload-on Lasix 40 mg bid. Will decrease to daily at discharge 7. As discussed with Dr. Kipp Brood, will discharge  Arlan Birks M [facility] 09/07/2019,8:06 AM

## 2019-09-07 NOTE — Progress Notes (Signed)
Attempted to get pt up to ealk pt states she is a little dizzy and wants to wait till later to walk, VSS pt has been 89-93 saturations during the night on room air

## 2019-09-08 ENCOUNTER — Other Ambulatory Visit: Payer: Self-pay | Admitting: Family Medicine

## 2019-09-08 ENCOUNTER — Other Ambulatory Visit: Payer: Self-pay

## 2019-09-08 DIAGNOSIS — Z794 Long term (current) use of insulin: Secondary | ICD-10-CM

## 2019-09-08 DIAGNOSIS — E1165 Type 2 diabetes mellitus with hyperglycemia: Secondary | ICD-10-CM

## 2019-09-08 MED ORDER — ONDANSETRON HCL 4 MG PO TABS: 4.0000 mg | ORAL_TABLET | Freq: Three times a day (TID) | ORAL | 0 refills | Status: AC | PRN

## 2019-09-08 MED FILL — Electrolyte-R (PH 7.4) Solution: INTRAVENOUS | Qty: 4000 | Status: AC

## 2019-09-08 MED FILL — Sodium Bicarbonate IV Soln 8.4%: INTRAVENOUS | Qty: 50 | Status: AC

## 2019-09-08 MED FILL — Sodium Chloride IV Soln 0.9%: INTRAVENOUS | Qty: 3000 | Status: AC

## 2019-09-08 MED FILL — Mannitol IV Soln 20%: INTRAVENOUS | Qty: 500 | Status: AC

## 2019-09-08 MED FILL — Calcium Chloride Inj 10%: INTRAVENOUS | Qty: 10 | Status: AC

## 2019-09-08 MED FILL — Heparin Sodium (Porcine) Inj 1000 Unit/ML: INTRAMUSCULAR | Qty: 10 | Status: AC

## 2019-09-08 NOTE — Telephone Encounter (Signed)
Pt has appt in december

## 2019-09-08 NOTE — Progress Notes (Signed)
Patient's niece, Levada Dy contacted the office x2 to express concern about patient complaint of nausea and vomiting.  She stated that she threw up 2 times today since being discharged.  Patient seems to think it may be new medication she was started on.  Advised patient was taking medications in hospital, which she did not have any nausea or vomiting. Called in prescription of Zofran per Nicholes Rough, PA 4 mg q8 hrs PRN times 5 days.  Patient's niece acknowledged receipt and advised to contact the office if the Zofran does not help, or if patient is unable to keep food or drink down.  Patient's family is aware.

## 2019-09-09 MED FILL — Heparin Sodium (Porcine) Inj 1000 Unit/ML: INTRAMUSCULAR | Qty: 30 | Status: AC

## 2019-09-09 MED FILL — Lidocaine HCl Local Preservative Free (PF) Inj 2%: INTRAMUSCULAR | Qty: 15 | Status: AC

## 2019-09-09 MED FILL — Potassium Chloride Inj 2 mEq/ML: INTRAVENOUS | Qty: 40 | Status: AC

## 2019-09-10 ENCOUNTER — Other Ambulatory Visit: Payer: Self-pay | Admitting: Internal Medicine

## 2019-09-10 MED ORDER — AMLODIPINE BESYLATE 10 MG PO TABS: 10.0000 mg | ORAL_TABLET | Freq: Every day | ORAL | 1 refills | Status: DC

## 2019-09-11 ENCOUNTER — Other Ambulatory Visit: Payer: Self-pay | Admitting: Family Medicine

## 2019-09-11 ENCOUNTER — Telehealth (HOSPITAL_COMMUNITY): Payer: Self-pay

## 2019-09-11 NOTE — Telephone Encounter (Signed)
Attempted to call patient in regards to Cardiac Rehab - LM on VM 

## 2019-09-11 NOTE — Telephone Encounter (Signed)
Pt has an appt in December 2021

## 2019-09-11 NOTE — Telephone Encounter (Signed)
Pt insurance is active and benefits verified through [facility]. Co-pay $45. 00, DED $0.00/$0.00 met, out of pocket $5,000.00/$439.56 met, co-insurance 0%. No pre-authorization required. Mel C./[employer], 09/11/19 @ 2:39PM, WOE#[id number]  Will contact patient to see if she is interested in the Cardiac Rehab Program. If interested, patient will need to complete follow up appt. Once completed, patient will be contacted for scheduling upon review by the RN Navigator.

## 2019-09-12 ENCOUNTER — Other Ambulatory Visit: Payer: Self-pay

## 2019-09-12 ENCOUNTER — Encounter: Payer: Self-pay | Admitting: Thoracic Surgery (Cardiothoracic Vascular Surgery)

## 2019-09-12 ENCOUNTER — Ambulatory Visit (INDEPENDENT_AMBULATORY_CARE_PROVIDER_SITE_OTHER): Payer: Self-pay | Admitting: Thoracic Surgery (Cardiothoracic Vascular Surgery)

## 2019-09-12 VITALS — BP 126/71 | HR 85 | Temp 98.6°F | Resp 24 | Ht 63.0 in | Wt 160.0 lb

## 2019-09-12 DIAGNOSIS — Z951 Presence of aortocoronary bypass graft: Secondary | ICD-10-CM

## 2019-09-12 NOTE — Progress Notes (Signed)
[address]       [place] [address]             [phone]        Jessica Hart [place] Medical Record #[id number] Date of Birth: 1940/12/05  Referring: Belva Crome, MD Primary Care: Girtha Rm, NP-C Primary Cardiologist:No primary care provider on file.  Reason for visit:   follow-up  History of Present Illness:     79 year old female presents for her first follow-up appointment.  She is done well following her CABG.  She states that she has not been taking her aspirin.  She does complain of some incisional chest pain.  Physical Exam: BP 126/71 (BP Location: Right Arm, Patient Position: Sitting, Cuff Size: Normal)   Pulse 85   Temp 98.6 F (37 C) (Temporal)   Resp (!) 24   Ht 5' 3" (1.6 m)   Wt 160 lb (72.6 kg)   SpO2 91% Comment: RA  BMI 28.34 kg/m   Alert NAD Incision clean.  Sternum stable Abdomen soft, ND Trace peripheral edema      Assessment / Plan:   79 year old female status post CABG currently doing well. I instructed her that she must continue to take her aspirin, statin and beta-blocker. She will follow-up in 1 month with a chest x-ray.   Lajuana Matte 09/12/2019 2:51 PM

## 2019-09-20 ENCOUNTER — Other Ambulatory Visit: Payer: Self-pay | Admitting: Family Medicine

## 2019-09-20 DIAGNOSIS — R69 Illness, unspecified: Secondary | ICD-10-CM | POA: Diagnosis not present

## 2019-09-23 ENCOUNTER — Encounter: Payer: Self-pay | Admitting: Internal Medicine

## 2019-09-23 ENCOUNTER — Other Ambulatory Visit: Payer: Self-pay

## 2019-09-23 ENCOUNTER — Ambulatory Visit: Payer: Medicare HMO | Admitting: Internal Medicine

## 2019-09-23 VITALS — BP 138/60 | HR 70 | Ht 63.0 in | Wt 160.6 lb

## 2019-09-23 DIAGNOSIS — E1169 Type 2 diabetes mellitus with other specified complication: Secondary | ICD-10-CM | POA: Diagnosis not present

## 2019-09-23 DIAGNOSIS — Z794 Long term (current) use of insulin: Secondary | ICD-10-CM | POA: Diagnosis not present

## 2019-09-23 DIAGNOSIS — I2510 Atherosclerotic heart disease of native coronary artery without angina pectoris: Secondary | ICD-10-CM | POA: Diagnosis not present

## 2019-09-23 DIAGNOSIS — Z87891 Personal history of nicotine dependence: Secondary | ICD-10-CM

## 2019-09-23 DIAGNOSIS — R072 Precordial pain: Secondary | ICD-10-CM

## 2019-09-23 DIAGNOSIS — Z951 Presence of aortocoronary bypass graft: Secondary | ICD-10-CM | POA: Diagnosis not present

## 2019-09-23 DIAGNOSIS — R9439 Abnormal result of other cardiovascular function study: Secondary | ICD-10-CM | POA: Diagnosis not present

## 2019-09-23 DIAGNOSIS — I10 Essential (primary) hypertension: Secondary | ICD-10-CM

## 2019-09-23 DIAGNOSIS — E785 Hyperlipidemia, unspecified: Secondary | ICD-10-CM | POA: Diagnosis not present

## 2019-09-23 DIAGNOSIS — E119 Type 2 diabetes mellitus without complications: Secondary | ICD-10-CM

## 2019-09-23 MED ORDER — DOCUSATE SODIUM 50 MG PO CAPS
50.0000 mg | ORAL_CAPSULE | Freq: Every day | ORAL | 0 refills | Status: DC | PRN
Start: 2019-09-23 — End: 2020-02-06

## 2019-09-23 MED ORDER — POLYETHYLENE GLYCOL 3350 17 G PO PACK: 17.0000 g | PACK | Freq: Every day | ORAL | 1 refills | Status: DC | PRN

## 2019-09-23 NOTE — Patient Instructions (Signed)
Medication Instructions:  Colace 50 mg as needed for mild constipation Miralax 17 g as need for mild constipation  *If you need a refill on your cardiac medications before your next appointment, please call your pharmacy*   Lab Work: None  Testing/Procedures: None   Follow-Up: At [facility], you and your health needs are our priority.  As part of our continuing mission to provide you with exceptional heart care, we have created designated Provider Care Teams.  These Care Teams include your primary Cardiologist (physician) and Advanced Practice Providers (APPs -  Physician Assistants and Nurse Practitioners) who all work together to provide you with the care you need, when you need it.  We recommend signing up for the patient portal called "MyChart".  Sign up information is provided on this After Visit Summary.  MyChart is used to connect with patients for Virtual Visits (Telemedicine).  Patients are able to view lab/test results, encounter notes, upcoming appointments, etc.  Non-urgent messages can be sent to your provider as well.   To learn more about what you can do with MyChart, go to [email or web address].    Your next appointment:   3 month(s)  The format for your next appointment:   In Person  Provider:   Cherlynn Kaiser, MD

## 2019-09-23 NOTE — Progress Notes (Signed)
Cardiology Office Note:    Date:  09/23/2019   ID:  Jessica Hart, DOB Jun 28, 1940, MRN [id number]  PCP:  Jessica Rm, NP-C  Cardiologist:  Jessica Munroe, MD  Electrophysiologist:  None   Referring MD: Jessica Rm, NP-C   Chief Complaint: CAD s/p CABG  History of Present Illness:    Jessica Hart is a 79 y.o. female with a history of diabetes mellitus type 2, hypertension, hyperlipidemia, GERD, remote tobacco abuse who presents for follow-up after CABG x3 LIMA to LAD, reverse saphenous vein graft to PDA and OM1.  She was initially referred for coronary angiography given progressive chest pain and infarct seen on stress test inferior and inferolateral with nuclear stress EF of 36%.  Coronary angiography showed severe three-vessel coronary artery obstructive disease with severe three-vessel calcification.  Coronary artery bypass grafting performed on 09/02/2019.  An echo was performed on 08-29-19 demonstrating mild mitral valve regurgitation and estimated ejection fraction 40 to 45% with regional wall motion abnormalities.  She is overall well and sternotomy is well-healing.  Her primary concern is constipation, and what sounds like hemorrhoids with mild bright red blood per rectum.  No red flag symptoms of GI bleed on thorough review.  She is not currently been taking a bowel regimen.  We will provide this today.  She has no chest pain and is overall progressing well after bypass.  Symptoms have improved with revascularization.  Medications reviewed in detail.   Past Medical History:  Diagnosis Date  . Arthritis   . Cataract bilaterl 3 years ago  . Coronary artery disease   . Diabetes mellitus 02/27/2005  . Diabetes mellitus type 2, controlled, without complications ([place]) 8/85/0277   Qualifier: Diagnosis of  By: Drucie Ip    . Diverticulosis   . Dyspnea   . Echocardiogram findings abnormal, without diagnosis 2005   EF 47%, no ischemia, no infarction  .  Former smoker 01/10/2017   45 pack year history, quit in 2007   . GERD (gastroesophageal reflux disease)   . Heart murmur   . History of bone density study 2006   Lt hip = -1.0, L spine = -1.8   . Hyperlipidemia   . Hypertension     Past Surgical History:  Procedure Laterality Date  . ABDOMINAL HYSTERECTOMY  1989   one ovary remains per patient  . CARDIAC CATHETERIZATION  08/20/2019  . COLONOSCOPY    . CORONARY ARTERY BYPASS GRAFT N/A 09/02/2019   Procedure: CORONARY ARTERY BYPASS GRAFTING (CABG) TIMES THREE USING LEFT INTERNAL MAMMARY ARTERY AND RIGHT GREATER SAPHENOUS VEIN;  Surgeon: Lajuana Matte, MD;  Location: [facility];  Service: Open Heart Surgery;  Laterality: N/A;  . ENDOVEIN HARVEST OF GREATER SAPHENOUS VEIN Right 09/02/2019   Procedure: ENDOVEIN HARVEST OF GREATER SAPHENOUS VEIN;  Surgeon: Lajuana Matte, MD;  Location: [facility];  Service: Open Heart Surgery;  Laterality: Right;  . EYE SURGERY Bilateral    CATARACT  . LEFT HEART CATH AND CORONARY ANGIOGRAPHY N/A 08/20/2019   Procedure: LEFT HEART CATH AND CORONARY ANGIOGRAPHY;  Surgeon: Belva Crome, MD;  Location: [facility];  Service: Cardiovascular;  Laterality: N/A;  . TEE WITHOUT CARDIOVERSION N/A 09/02/2019   Procedure: TRANSESOPHAGEAL ECHOCARDIOGRAM (TEE);  Surgeon: Lajuana Matte, MD;  Location: [facility];  Service: Open Heart Surgery;  Laterality: N/A;  . TONSILECTOMY, ADENOIDECTOMY, BILATERAL MYRINGOTOMY AND TUBES  1965  . TONSILLECTOMY      Current Medications: Current Meds  Medication Sig  . acetaminophen (TYLENOL) 500 MG tablet Take 2 tablets (1,000 mg total) by mouth every 8 (eight) hours as needed.  Marland Kitchen amLODipine (NORVASC) 10 MG tablet Take 1 tablet (10 mg total) by mouth daily.  Marland Kitchen aspirin EC 325 MG EC tablet Take 1 tablet (325 mg total) by mouth daily.  Marland Kitchen FARXIGA 5 MG TABS tablet TAKE 1 TABLET BY MOUTH EVERY DAY  . furosemide (LASIX) 40 MG tablet Take 1 tablet (40 mg total) by mouth daily. For  one week then stop.  . insulin degludec (TRESIBA FLEXTOUCH) 100 UNIT/ML SOPN FlexTouch Pen Inject 0.1 mLs (10 Units total) into the skin daily after supper.  . Insulin Pen Needle (PEN NEEDLES 5/16") 30G X 8 MM MISC Pen needles used for lantus injections  . Lancets (ONETOUCH DELICA PLUS [id number]) MISC 1 APPLICATION BY OTHER ROUTE DAILY. TEST BLOOD SUGAR ONCE A DAY  . lipase/protease/amylase (CREON) 36000 UNITS CPEP capsule Take 2 capsules with each meal three times daily  Take 1 capsule with each snack twice daily (Patient taking differently: Take 36,000-72,000 Units by mouth See admin instructions. Take 2 capsules with each meal three times daily  Take 1 capsule with each snack twice daily)  . lovastatin (MEVACOR) 20 MG tablet TAKE ONE TABLET BY MOUTH ONCE DAILY  . metFORMIN (GLUCOPHAGE) 1000 MG tablet TAKE 1 TABLET BY MOUTH TWICE DAILY WITH A MEAL  . metoprolol tartrate 37.5 MG TABS Take 37.5 mg by mouth 2 (two) times daily.  Marland Kitchen ofloxacin (OCUFLOX) 0.3 % ophthalmic solution Place 1 drop into the left eye in the morning, at noon, and at bedtime. For 10 days   . omeprazole (PRILOSEC) 40 MG capsule TAKE 1 CAPSULE BY MOUTH EVERY DAY (Patient taking differently: Take 40 mg by mouth daily. )  . ONETOUCH VERIO test strip USE 1 STRIP TO CHECK GLUCOSE ONCE DAILY  . potassium chloride SA (KLOR-CON) 20 MEQ tablet Take 1 tablet (20 mEq total) by mouth daily. For one week then stop.  Marland Kitchen tobramycin (TOBREX) 0.3 % ophthalmic solution Place 1 drop into the left eye every morning.   . traMADol (ULTRAM) 50 MG tablet Take 1 tablet (50 mg total) by mouth every 6 (six) hours as needed for moderate pain.     Allergies:   Lisinopril   Social History   Socioeconomic History  . Marital status: Married    Spouse name: Jessica Hart  . Number of children: 1  . Years of education: 48  . Highest education level: Not on file  Occupational History  . Occupation: Retire-Textile    Employer: RETIRED  Tobacco Use  . Smoking  status: Former Smoker    Packs/day: 1.00    Years: 45.00    Pack years: 45.00    Quit date: 07/27/2005    Years since quitting: 14.1  . Smokeless tobacco: Former Systems developer    Quit date: 03/2005  Vaping Use  . Vaping Use: Never used  Substance and Sexual Activity  . Alcohol use: Yes    Comment: beer occasionally   . Drug use: No  . Sexual activity: Not Currently    Partners: Male    Comment: 1st intercourse-22, partners- 17, married- 91 yrs   Other Topics Concern  . Not on file  Social History Narrative   Health Care POA:    Emergency Contact: husband, Jessica Hart   End of Life Plan:    Who lives with you: Lives with husband   Any pets: gold fish  Diet: Patient has a varied diet and reports stuggeling with portion size and ice cream.   Exercise: Patient does not have currently exercise routine.   Seatbelts: Patient reports wearing seatbelt when in vehicle.   Sun Exposure/Protection: Patient reports not using sun protection.   Hobbies: Bingo         Social Determinants of Health   Financial Resource Strain:   . Difficulty of Paying Living Expenses:   Food Insecurity:   . Worried About Charity fundraiser in the Last Year:   . Arboriculturist in the Last Year:   Transportation Needs:   . Film/video editor (Medical):   Marland Kitchen Lack of Transportation (Non-Medical):   Physical Activity:   . Days of Exercise per Week:   . Minutes of Exercise per Session:   Stress:   . Feeling of Stress :   Social Connections:   . Frequency of Communication with Friends and Family:   . Frequency of Social Gatherings with Friends and Family:   . Attends Religious Services:   . Active Member of Clubs or Organizations:   . Attends Archivist Meetings:   Marland Kitchen Marital Status:      Family History: The patient's family history includes Cancer in her sister; Heart disease in her father; Tongue cancer in her sister. There is no history of Colon polyps, Rectal cancer, or Stomach  cancer.  ROS:   Please see the history of present illness.    All other systems reviewed and are negative.  EKGs/Labs/Other Studies Reviewed:    The following studies were reviewed today:  EKG:  n/a Recent Labs: 01/29/2019: TSH 0.923 08/29/2019: ALT 19 09/03/2019: Magnesium 2.0 09/05/2019: BUN 11; Creatinine, Ser 0.90; Hemoglobin 12.6; Platelets 233; Potassium 4.3; Sodium 136  Recent Lipid Panel    Component Value Date/Time   CHOL 153 01/29/2019 1057   TRIG 216 (H) 01/29/2019 1057   HDL 38 (L) 01/29/2019 1057   CHOLHDL 4.0 01/29/2019 1057   CHOLHDL 4.4 08/29/2016 0920   VLDL 28 08/29/2016 0920   LDLCALC 79 01/29/2019 1057   LDLDIRECT 81 03/21/2013 1431    Physical Exam:    VS:  BP (!) 138/60   Pulse 70   Ht _0  (1.6 m)   Wt 160 lb 9.6 oz (72.8 kg)   SpO2 (!) 89%   BMI 28.45 kg/m     Wt Readings from Last 5 Encounters:  09/23/19 160 lb 9.6 oz (72.8 kg)  09/12/19 160 lb (72.6 kg)  09/07/19 158 lb 1.1 oz (71.7 kg)  08/29/19 166 lb 4.8 oz (75.4 kg)  08/26/19 170 lb (77.1 kg)     Constitutional: No acute distress Eyes: sclera non-icteric, normal conjunctiva and lids ENMT: normal dentition, moist mucous membranes Cardiovascular: regular rhythm, normal rate, no murmurs. S1 and S2 normal. Radial pulses normal bilaterally. No jugular venous distention.  Sternotomy well-healing. Respiratory: clear to auscultation bilaterally GI : normal bowel sounds, soft and nontender. No distention.   MSK: extremities warm, well perfused.  Trace edema.  NEURO: grossly nonfocal exam, moves all extremities. PSYCH: alert and oriented x 3, normal mood and affect.   ASSESSMENT:    1. S/P CABG x 3   2. Coronary artery disease involving native heart without angina pectoris, unspecified vessel or lesion type   3. Abnormal stress test   4. Precordial pain   5. Essential hypertension   6. Hyperlipidemia associated with type 2 diabetes mellitus ([place])   7. Former smoker  8. Controlled type 2  diabetes mellitus without complication, with long-term current use of insulin ([facility])    PLAN:    S/P CABG x 3 Coronary artery disease involving native heart without angina pectoris, unspecified vessel or lesion type Abnormal stress test Precordial pain - Doing well without chest pain or shortness of breath, symptoms have improved since revascularization. -Continue ASA 325 mg daily until follow-up with CV surgery in a few weeks, then can transition to aspirin 81 mg daily.  -For decrease systolic function, she is already on Farxiga 5 mg daily.  Would recommend increasing this to 10 mg daily for the benefit of her systolic function. -On lovastatin 20 mg daily, continue.  Will consider transition to high intensity statin versus PCSK9 inhibitor. -On metoprolol tartrate 37.5 mg twice daily, continue. -Would recommend repeat echocardiogram in 3 to 6 months, we will discuss at follow-up.  Essential hypertension-amlodipine and losartan held on hospital dismissal likely secondary to hypotension.  She has resumed amlodipine, however I would recommend resuming losartan for the benefit of her reduced ejection fraction.  Blood pressure today seems appropriate for restarting losartan.  Continue beta-blocker.  Hyperlipidemia associated with type 2 diabetes mellitus ([place]) -will refer to pharmacist for discussion of PCSK9 inhibitor.  Controlled type 2 diabetes mellitus without complication, with long-term current use of insulin ([facility])-on Farxiga, would increase the dose to 10 mg daily for the benefit of her systolic function.    Total time of encounter: 37 minutes total time of encounter, including 25 minutes spent in face-to-face patient care on the date of this encounter. This time includes coordination of care and counseling regarding above mentioned problem list. Remainder of non-face-to-face time involved reviewing chart documents/testing relevant to the patient encounter and documentation in the medical  record. I have independently reviewed documentation from referring provider.   Cherlynn Kaiser, MD [place]  [facility]    Medication Adjustments/Labs and Tests Ordered: Current medicines are reviewed at length with the patient today.  Concerns regarding medicines are outlined above.  No orders of the defined types were placed in this encounter.  Meds ordered this encounter  Medications  . docusate sodium (COLACE) 50 MG capsule    Sig: Take 1 capsule (50 mg total) by mouth daily as needed for mild constipation.    Dispense:  30 capsule    Refill:  0  . polyethylene glycol (MIRALAX / GLYCOLAX) 17 g packet    Sig: Take 17 g by mouth daily as needed for mild constipation.    Dispense:  14 each    Refill:  1    Patient Instructions  Medication Instructions:  Colace 50 mg as needed for mild constipation Miralax 17 g as need for mild constipation  *If you need a refill on your cardiac medications before your next appointment, please call your pharmacy*   Lab Work: None  Testing/Procedures: None   Follow-Up: At [facility], you and your health needs are our priority.  As part of our continuing mission to provide you with exceptional heart care, we have created designated Provider Care Teams.  These Care Teams include your primary Cardiologist (physician) and Advanced Practice Providers (APPs -  Physician Assistants and Nurse Practitioners) who all work together to provide you with the care you need, when you need it.  We recommend signing up for the patient portal called "MyChart".  Sign up information is provided on this After Visit Summary.  MyChart is used to connect with patients for Virtual Visits (Telemedicine).  Patients are able to view lab/test results, encounter notes, upcoming appointments, etc.  Non-urgent messages can be sent to your provider as well.   To learn more about what you can do with MyChart, go to [email or web address].    Your next  appointment:   3 month(s)  The format for your next appointment:   In Person  Provider:   Cherlynn Kaiser, MD

## 2019-09-29 ENCOUNTER — Telehealth (HOSPITAL_COMMUNITY): Payer: Self-pay

## 2019-09-29 ENCOUNTER — Other Ambulatory Visit: Payer: Self-pay | Admitting: Physician Assistant

## 2019-09-29 NOTE — Telephone Encounter (Signed)
Pt returned CR phone call, patient expressed interest. Explained scheduling process and went over insurance, patient verbalized understanding. Will contact patient for scheduling once f/u has been completed.

## 2019-10-09 ENCOUNTER — Encounter: Payer: Self-pay | Admitting: Family Medicine

## 2019-10-09 ENCOUNTER — Other Ambulatory Visit: Payer: Self-pay

## 2019-10-09 ENCOUNTER — Ambulatory Visit (INDEPENDENT_AMBULATORY_CARE_PROVIDER_SITE_OTHER): Payer: Medicare HMO | Admitting: Family Medicine

## 2019-10-09 ENCOUNTER — Telehealth: Payer: Self-pay | Admitting: Internal Medicine

## 2019-10-09 VITALS — BP 140/80 | HR 103 | Wt 162.2 lb

## 2019-10-09 DIAGNOSIS — R42 Dizziness and giddiness: Secondary | ICD-10-CM

## 2019-10-09 DIAGNOSIS — E1169 Type 2 diabetes mellitus with other specified complication: Secondary | ICD-10-CM

## 2019-10-09 DIAGNOSIS — I10 Essential (primary) hypertension: Secondary | ICD-10-CM | POA: Diagnosis not present

## 2019-10-09 DIAGNOSIS — H6121 Impacted cerumen, right ear: Secondary | ICD-10-CM

## 2019-10-09 DIAGNOSIS — E785 Hyperlipidemia, unspecified: Secondary | ICD-10-CM | POA: Diagnosis not present

## 2019-10-09 DIAGNOSIS — E119 Type 2 diabetes mellitus without complications: Secondary | ICD-10-CM

## 2019-10-09 MED ORDER — METOPROLOL TARTRATE 37.5 MG PO TABS: 37.5000 mg | ORAL_TABLET | Freq: Two times a day (BID) | ORAL | 3 refills | Status: DC

## 2019-10-09 MED ORDER — AMLODIPINE BESYLATE 5 MG PO TABS: 5.0000 mg | ORAL_TABLET | Freq: Every day | ORAL | 3 refills | Status: DC

## 2019-10-09 NOTE — Telephone Encounter (Signed)
Follow Up ° °Patient is returning call. Please give patient a call back.  °

## 2019-10-09 NOTE — Telephone Encounter (Signed)
Jessica Hart from [facility] calling stating the patient saw them today and is confused about her medications. She would like a nurse to call the patient back to go over them.

## 2019-10-09 NOTE — Telephone Encounter (Signed)
Have her stay off that for now.  We can add it back in if her pressure continues to rise

## 2019-10-09 NOTE — Progress Notes (Signed)
   Subjective:    Patient ID: Jessica Hart, female    DOB: Oct 02, 1940, 79 y.o.   MRN: [id number]  HPI Chief Complaint  Patient presents with  . multiple issues    changes in meds- change aspirin, metoprolol. but told to stop amodipine and losartan. needs to be advised.    She is here with concerns regarding her medications. She is confused about which medications she is supposed to be taking. States she was just at her cardiologist a couple of weeks ago but is not clear on the changes that were made. She also needs refills.   Diabetes- Dr. Kipp Brood checked her Hgb A1c 6.7% on 08/29/2019  Denies fever, chills, chest pain, palpitations, shortness of breath, abdominal pain, N/V/D, urinary symptoms, LE edema.   States she has been having a spinning sensation when she lays down at night occasionally. States her right ear feels full. No pain. No uri Sxs.   Reviewed allergies, medications, past medical, surgical, family, and social history.   Review of Systems Pertinent positives and negatives in the history of present illness.     Objective:   Physical Exam BP 140/80   Pulse (!) 103   Wt 162 lb 3.2 oz (73.6 kg)   SpO2 98%   BMI 28.73 kg/m   Alert and in no distress. Unable to visualize TMs bilaterally due to cerumen impaction. Tympanic membranes and canals are normal post ear lavage. Cardiac exam shows a regular rate and rhythm.  Lungs are clear to auscultation. Extremities without edema.       Assessment & Plan:  Controlled type 2 diabetes mellitus without complication, unspecified whether long term insulin use ([place])  Hyperlipidemia associated with type 2 diabetes mellitus ([place])  Essential hypertension  Vertigo  Impacted cerumen of right ear  Her main concern today is that she is confused about her medications. My CMA, Gabriel Cirri, called her cardiology office and they will contact her to clear up any concerns she has regarding her heart medications. Review of labs  shows that her hemoglobin A1c was 6.7% in July and her diabetes is well controlled. She is followed closely by her cardiologist and cardiothoracic surgeon. Cerumen impaction-ear lavage performed by my CMA and her exam was normal and she reported significant improvement in ear fullness. Hopefully this will help improve her vertigo. She will let me know if she continues having vertigo symptoms. Encouraged her to change positions slowly.

## 2019-10-09 NOTE — Patient Instructions (Signed)
Per your cardiologist, you should be taking losartan and amlodipine. Please call her office to ask for her nurse.

## 2019-10-09 NOTE — Telephone Encounter (Signed)
[id number] on pt's phone.

## 2019-10-09 NOTE — Telephone Encounter (Signed)
Called and spoke with pt, she asked for a refill of her metoprolol tartrate to be sent in to her preferred pharmacy. (will send) she reports she was told to stop taking her amlodipine and her losartan when she left the hospital, that it was on her discharge summary.     Medication List    STOP taking these medications   amLODipine 10 MG tablet Commonly known as: NORVASC   losartan 50 MG tablet Commonly known as: COZAAR    Notified that I no longer saw the losartan on her med list but the amlodipine was still listed.  Pt also asking about her potassium. Notified that per the instructions on her potassium it stated to take for a week then stop. Notified she should no longer be taking the potassium.  Pt would still like clarification on her amlodipine and losartan. Notified I would send this message to Dr.Acharya and our pharmacists to advise. Pt verbalized understanding with no other questions at this time.

## 2019-10-09 NOTE — Telephone Encounter (Signed)
Looks like her BP has started creeping upward since hospital discharge.   Would recommend she go back on amlodipine, but just 5 mg daily.  Have her monitor home readings and let us know if systolic pressure stays > 140

## 2019-10-09 NOTE — Telephone Encounter (Signed)
Called and spoke with pt, notified of pharmacist's recommendations. She is aware to stay off the losartan and to start amlodipine 18m daily. She will let uKoreaknow if her BP stays >140. No other questions from pt at this time. Will send in prescription for amlodipine to preferred pharmacy.

## 2019-10-13 ENCOUNTER — Other Ambulatory Visit: Payer: Self-pay | Admitting: Family Medicine

## 2019-10-13 DIAGNOSIS — K219 Gastro-esophageal reflux disease without esophagitis: Secondary | ICD-10-CM

## 2019-10-16 ENCOUNTER — Other Ambulatory Visit: Payer: Self-pay | Admitting: Thoracic Surgery (Cardiothoracic Vascular Surgery)

## 2019-10-16 DIAGNOSIS — Z951 Presence of aortocoronary bypass graft: Secondary | ICD-10-CM

## 2019-10-17 ENCOUNTER — Ambulatory Visit
Admission: RE | Admit: 2019-10-17 | Discharge: 2019-10-17 | Disposition: A | Discharge status: Home / Self Care | Payer: Medicare HMO | Source: Ambulatory Visit | Attending: Thoracic Surgery (Cardiothoracic Vascular Surgery) | Admitting: Thoracic Surgery (Cardiothoracic Vascular Surgery)

## 2019-10-17 ENCOUNTER — Encounter: Payer: Self-pay | Admitting: Thoracic Surgery (Cardiothoracic Vascular Surgery)

## 2019-10-17 ENCOUNTER — Ambulatory Visit (INDEPENDENT_AMBULATORY_CARE_PROVIDER_SITE_OTHER): Payer: Self-pay | Admitting: Thoracic Surgery (Cardiothoracic Vascular Surgery)

## 2019-10-17 ENCOUNTER — Other Ambulatory Visit: Payer: Self-pay

## 2019-10-17 VITALS — BP 155/81 | HR 76 | Temp 98.8°F | Resp 20 | Ht 63.0 in | Wt 161.5 lb

## 2019-10-17 DIAGNOSIS — Z951 Presence of aortocoronary bypass graft: Secondary | ICD-10-CM

## 2019-10-17 DIAGNOSIS — J9811 Atelectasis: Secondary | ICD-10-CM | POA: Diagnosis not present

## 2019-10-17 NOTE — Progress Notes (Signed)
[address]       [place] [address]             [phone]        [place] [facility] [place] Medical Record #[id number] Date of Birth: 08-25-1940  Referring: Belva Crome, MD Primary Care: Girtha Rm, NP-C Primary Cardiologist:Gayatri Stann Mainland, MD  Reason for visit:   follow-up  History of Present Illness:     Jessica Hart comes in for second follow-up. She has done very well at home. She walks a great she still chest pain or shortness of breath.  Physical Exam: BP (!) 155/81 (BP Location: Right Arm, Patient Position: Sitting, Cuff Size: Normal)   Pulse 76   Temp 98.8 F (37.1 C) (Oral)   Resp 20   Ht _0  (1.6 m)   Wt 161 lb 8 oz (73.3 kg)   SpO2 95% Comment: RA  BMI 28.61 kg/m   Alert NAD Incision clean.  Sternum stable Abdomen soft, ND No peripheral edema   Diagnostic Studies & Laboratory data: CXR: Clear     Assessment / Plan:   79 year old female status post CABG. Doing well Cleared for cardiac rehab Return to clinic as needed.   Lajuana Matte 10/17/2019 2:31 PM

## 2019-10-31 ENCOUNTER — Telehealth (HOSPITAL_COMMUNITY): Payer: Self-pay

## 2019-10-31 NOTE — Telephone Encounter (Signed)
Called patient to see if she was interested in participating in the Cardiac Rehab Program. Patient stated yes. Patient will come in for orientation on 12/09/19 @ 10AM and will attend the 9AM exercise class.  Mailed letter.

## 2019-11-04 ENCOUNTER — Telehealth: Payer: Self-pay | Admitting: Family Medicine

## 2019-11-04 NOTE — Telephone Encounter (Signed)
Pt called for samples of insulin x 2 and pen needles.  She states she ususally gets samples of both.

## 2019-11-05 NOTE — Telephone Encounter (Signed)
Pt. Samples put in the frig and she is aware to pick them up.

## 2019-11-12 ENCOUNTER — Other Ambulatory Visit: Payer: Self-pay | Admitting: Family Medicine

## 2019-11-12 DIAGNOSIS — R69 Illness, unspecified: Secondary | ICD-10-CM | POA: Diagnosis not present

## 2019-11-20 ENCOUNTER — Other Ambulatory Visit: Payer: Self-pay | Admitting: Family Medicine

## 2019-11-20 NOTE — Telephone Encounter (Signed)
Dose was changed by cardiology

## 2019-12-04 ENCOUNTER — Telehealth (HOSPITAL_COMMUNITY): Payer: Self-pay | Admitting: Pharmacist

## 2019-12-04 NOTE — Telephone Encounter (Signed)
Cardiac Rehab Medication Review by a Pharmacist  Does the patient  feel that his/her medications are working for him/her?  yes  Has the patient been experiencing any side effects to the medications prescribed?  no  Does the patient measure his/her own blood pressure or blood glucose at home?  Patient endorsed checking blood sugar regularly. Patient has noticed her blood sugars have been between 110-120.  Does the patient have any problems obtaining medications due to transportation or finances?   no  Understanding of regimen: good Understanding of indications: good Potential of compliance: excellent    Pharmacist Intervention: Patient is very compliant to medications per discussion. Patient needed some help remembering indications for all medications and did rely on bottles to remember some of her medications.    Cephus Slater, PharmD, [facility] Resident [phone] 12/04/2019 10:29 AM

## 2019-12-06 ENCOUNTER — Other Ambulatory Visit: Payer: Self-pay | Admitting: Family Medicine

## 2019-12-08 ENCOUNTER — Telehealth (HOSPITAL_COMMUNITY): Payer: Self-pay | Admitting: *Deleted

## 2019-12-08 NOTE — Telephone Encounter (Signed)
Left message to call cardiac rehab regarding cardiac rehab orientation appointment for 12/09/19.Barnet Pall, RN,BSN 12/08/2019 4:42 PM

## 2019-12-09 ENCOUNTER — Encounter (HOSPITAL_COMMUNITY): Payer: Self-pay

## 2019-12-09 ENCOUNTER — Encounter (HOSPITAL_COMMUNITY)
Admission: RE | Admit: 2019-12-09 | Discharge: 2019-12-09 | Disposition: A | Discharge status: Home / Self Care | Payer: Medicare HMO | Source: Ambulatory Visit | Attending: Internal Medicine | Admitting: Internal Medicine

## 2019-12-09 ENCOUNTER — Other Ambulatory Visit: Payer: Self-pay

## 2019-12-09 VITALS — BP 124/72 | HR 84 | Ht 63.25 in | Wt 165.8 lb

## 2019-12-09 DIAGNOSIS — Z951 Presence of aortocoronary bypass graft: Secondary | ICD-10-CM | POA: Insufficient documentation

## 2019-12-09 DIAGNOSIS — Z79899 Other long term (current) drug therapy: Secondary | ICD-10-CM | POA: Insufficient documentation

## 2019-12-09 NOTE — Progress Notes (Signed)
Cardiac Individual Treatment Plan  Patient Details  Name: Jessica Hart MRN: [id number] Date of Birth: 1940-09-12 Referring Provider:     CARDIAC REHAB PHASE II ORIENTATION from 12/09/2019 in [place]  Referring Provider Gayatri A. 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  acetaminophen (TYLENOL) 500 MG tablet, Take 2 tablets (1,000 mg total) by mouth every 8 (eight) hours as needed., Disp: 30 tablet, Rfl: 2 .  amLODipine (NORVASC) 5 MG tablet, Take 1 tablet (5 mg total) by mouth daily., Disp: 90 tablet, Rfl: 3 .  aspirin EC 325 MG EC tablet, Take 1 tablet (325 mg total) by mouth daily., Disp: 30 tablet, Rfl: 0 .  docusate sodium (COLACE) 50 MG capsule, Take 1 capsule (50 mg total) by mouth daily as needed for mild constipation., Disp: 30 capsule, Rfl: 0 .  FARXIGA 5 MG TABS tablet, TAKE 1 TABLET BY MOUTH EVERY DAY, Disp: 90 tablet, Rfl: 1 .  insulin degludec (TRESIBA FLEXTOUCH) 100 UNIT/ML SOPN FlexTouch Pen, Inject 0.1 mLs (10 Units total) into the skin daily after supper., Disp: 2 pen, Rfl: 0 .  Insulin Pen Needle (PEN NEEDLES 5/16") 30G X 8 MM MISC, Pen needles used for lantus injections, Disp: 100 each, Rfl: 3 .  Lancets (ONETOUCH DELICA PLUS [id number]) MISC, 1 APPLICATION BY OTHER ROUTE DAILY. TEST BLOOD SUGAR ONCE A DAY, Disp: 100 each, Rfl: 1 .  lipase/protease/amylase (CREON) 36000 UNITS CPEP capsule, Take 2 capsules with each meal three times daily  Take 1 capsule with each snack twice daily (Patient taking differently: Take 36,000-72,000 Units by mouth See admin instructions. Take 2 capsules with each meal three times daily  Take 1 capsule with each snack twice daily), Disp: 240 capsule, Rfl: 11 .   lovastatin (MEVACOR) 20 MG tablet, TAKE ONE TABLET BY MOUTH ONCE DAILY, Disp: 90 tablet, Rfl: 1 .  metFORMIN (GLUCOPHAGE) 1000 MG tablet, TAKE 1 TABLET BY MOUTH TWICE DAILY WITH A MEAL, Disp: 180 tablet, Rfl: 0 .  Metoprolol Tartrate 37.5 MG TABS, Take 37.5 mg by mouth 2 (two) times daily., Disp: 90 tablet, Rfl: 3 .  omeprazole (PRILOSEC) 40 MG capsule, TAKE 1 CAPSULE BY MOUTH EVERY DAY, Disp: 90 capsule, Rfl: 0 .  ONETOUCH VERIO test strip, USE 1 STRIP TO CHECK GLUCOSE ONCE DAILY, Disp: 100 strip, Rfl: 1 .  polyethylene glycol (MIRALAX / GLYCOLAX) 17 g packet, Take 17 g by mouth daily as needed for mild constipation., Disp: 14 each, Rfl: 1 .  tobramycin (TOBREX) 0.3 % ophthalmic solution, Place 1 drop into the left eye every morning.  (Patient not taking: Reported on 12/04/2019), Disp: , Rfl:   Past Medical History: Past Medical History:  Diagnosis Date  . Arthritis   . Cataract bilaterl 3 years ago  . Coronary artery disease   . Diabetes mellitus 02/27/2005  . Diabetes mellitus type 2, controlled, without complications ([place]) 05/20/5571   Qualifier: Diagnosis of  By: Drucie Ip    . Diverticulosis   . Dyspnea   . Echocardiogram findings abnormal, without diagnosis 2005   EF 47%, no ischemia, no infarction  . Former smoker 01/10/2017   45 pack year history, quit in 2007   . GERD (gastroesophageal  reflux disease)   . Heart murmur   . History of bone density study 2006   Lt hip = -1.0, L spine = -1.8   . Hyperlipidemia   . Hypertension     Tobacco Use: Social History   Tobacco Use  Smoking Status Former Smoker  . Packs/day: 1.00  . Years: 45.00  . Pack years: 45.00  . Quit date: 07/27/2005  . Years since quitting: 14.3  Smokeless Tobacco Former Systems developer  . Quit date: 03/2005    Labs: Recent Review Flowsheet Data    Labs for ITP Cardiac and Pulmonary Rehab Latest Ref Rng & Units 09/02/2019 09/02/2019 09/02/2019 09/03/2019 09/04/2019   Cholestrol 100 - 199 mg/dL - - - - -   LDLCALC 0 -  99 mg/dL - - - - -   LDLDIRECT mg/dL - - - - -   HDL >39 mg/dL - - - - -   Trlycerides 0 - 149 mg/dL - - - - -   Hemoglobin A1c 4.8 - 5.6 % - - - - -   PHART 7.35 - 7.45 7.326(L) 7.397 7.374 - -   PCO2ART 32 - 48 mmHg 42.3 32.0 34.9 - -   HCO3 20.0 - 28.0 mmol/L 22.6 19.8(L) 20.3 - -   TCO2 22 - 32 mmol/L 24 21(L) 21(L) - -   ACIDBASEDEF 0.0 - 2.0 mmol/L 4.0(H) 4.0(H) 4.0(H) - -   O2SAT % 90.0 93.0 93.0 53.9 69.8      Capillary Blood Glucose: Lab Results  Component Value Date   GLUCAP 118 (H) 09/07/2019   GLUCAP 110 (H) 09/07/2019   GLUCAP 126 (H) 09/06/2019   GLUCAP 117 (H) 09/06/2019   GLUCAP 114 (H) 09/06/2019     Exercise Target Goals: Exercise Program Goal: Individual exercise prescription set using results from initial 6 min walk test and THRR while considering  patient's activity barriers and safety.   Exercise Prescription Goal: Starting with aerobic activity 30 plus minutes a day, 3 days per week for initial exercise prescription. Provide home exercise prescription and guidelines that participant acknowledges understanding prior to discharge.  Activity Barriers & Risk Stratification:  Activity Barriers & Cardiac Risk Stratification - 12/09/19 1152      Activity Barriers & Cardiac Risk Stratification   Activity Barriers Arthritis    Cardiac Risk Stratification High           6 Minute Walk:  6 Minute Walk    Row Name 12/09/19 1050         6 Minute Walk   Phase Initial     Distance 1097 feet     Walk Time 6 minutes     # of Rest Breaks 1  Took 47 second rest break (3:56 - 4:43).     MPH 2.1     METS 2.1     RPE 13     Perceived Dyspnea  1     VO2 Peak 7.3     Symptoms Yes (comment)     Comments SOB, RPD = 1     Resting HR 84 bpm     Resting BP 124/72     Resting Oxygen Saturation  95 %     Exercise Oxygen Saturation  during 6 min walk 90 %  Pt's hands were cold. Warmed hands.     Max Ex. HR 101 bpm     Max Ex. BP 140/70     2 Minute Post BP  128/70  Oxygen Initial Assessment:   Oxygen Re-Evaluation:   Oxygen Discharge (Final Oxygen Re-Evaluation):   Initial Exercise Prescription:  Initial Exercise Prescription - 12/09/19 1100      Date of Initial Exercise RX and Referring Provider   Date 12/09/19    Referring Provider Gayatri A. Margaretann Loveless, MD    Expected Discharge Date 02/06/20      NuStep   Level 2    SPM 75    Minutes 30    METs 2      Prescription Details   Frequency (times per week) 3    Duration Progress to 30 minutes of continuous aerobic without signs/symptoms of physical distress      Intensity   THRR 40-80% of Max Heartrate 57-114    Ratings of Perceived Exertion 11-13    Perceived Dyspnea 0-4      Progression   Progression Continue progressive overload as per policy without signs/symptoms or physical distress.      Resistance Training   Training Prescription Yes    Weight 2 lbs    Reps 10-15           Perform Capillary Blood Glucose checks as needed.  Exercise Prescription Changes:   Exercise Comments:   Exercise Goals and Review:   Exercise Goals    Row Name 12/09/19 1154             Exercise Goals   Increase Physical Activity Yes       Expected Outcomes Short Term: Attend rehab on a regular basis to increase amount of physical activity.;Long Term: Add in home exercise to make exercise part of routine and to increase amount of physical activity.;Long Term: Exercising regularly at least 3-5 days a week.       Increase Strength and Stamina Yes       Intervention Provide advice, education, support and counseling about physical activity/exercise needs.;Develop an individualized exercise prescription for aerobic and resistive training based on initial evaluation findings, risk stratification, comorbidities and participant's personal goals.       Expected Outcomes Short Term: Increase workloads from initial exercise prescription for resistance, speed, and METs.;Short  Term: Perform resistance training exercises routinely during rehab and add in resistance training at home;Long Term: Improve cardiorespiratory fitness, muscular endurance and strength as measured by increased METs and functional capacity (6MWT)       Able to understand and use rate of perceived exertion (RPE) scale Yes       Intervention Provide education and explanation on how to use RPE scale       Expected Outcomes Short Term: Able to use RPE daily in rehab to express subjective intensity level;Long Term:  Able to use RPE to guide intensity level when exercising independently       Knowledge and understanding of Target Heart Rate Range (THRR) Yes       Intervention Provide education and explanation of THRR including how the numbers were predicted and where they are located for reference       Expected Outcomes Short Term: Able to state/look up THRR;Short Term: Able to use daily as guideline for intensity in rehab;Long Term: Able to use THRR to govern intensity when exercising independently       Understanding of Exercise Prescription Yes       Intervention Provide education, explanation, and written materials on patient's individual exercise prescription       Expected Outcomes Short Term: Able to explain program exercise prescription;Long Term: Able to explain home exercise prescription  to exercise independently              Exercise Goals Re-Evaluation :    Discharge Exercise Prescription (Final Exercise Prescription Changes):   Nutrition:  Target Goals: Understanding of nutrition guidelines, daily intake of sodium <1561m, cholesterol <2049m calories 30% from fat and 7% or less from saturated fats, daily to have 5 or more servings of fruits and vegetables.  Biometrics:  Pre Biometrics - 12/09/19 1000      Pre Biometrics   Waist Circumference 39 inches    Hip Circumference 41 inches    Waist to Hip Ratio 0.95 %    Triceps Skinfold 32 mm    % Body Fat 42.7 %    Grip Strength 37  kg    Flexibility 15.5 in    Single Leg Stand 6.06 seconds   Moderate risk for fall           Nutrition Therapy Plan and Nutrition Goals:   Nutrition Assessments:   Nutrition Goals Re-Evaluation:   Nutrition Goals Discharge (Final Nutrition Goals Re-Evaluation):   Psychosocial: Target Goals: Acknowledge presence or absence of significant depression and/or stress, maximize coping skills, provide positive support system. Participant is able to verbalize types and ability to use techniques and skills needed for reducing stress and depression.  Initial Review & Psychosocial Screening:  Initial Psych Review & Screening - 12/09/19 1039      Initial Review   Current issues with None Identified      Family Dynamics   Good Support System? Yes   GwMeredith Modyas her husband RiDelfino Lovettor support   Concerns No support system      Barriers   Psychosocial barriers to participate in program There are no identifiable barriers or psychosocial needs.      Screening Interventions   Interventions Encouraged to exercise           Quality of Life Scores:  Quality of Life - 12/09/19 1144      Quality of Life   Select Quality of Life      Quality of Life Scores   Health/Function Pre 26.03 %    Socioeconomic Pre 23.17 %    Psych/Spiritual Pre 29.14 %    Family Pre 27.6 %    GLOBAL Pre 26.41 %          Scores of 19 and below usually indicate a poorer quality of life in these areas.  A difference of  2-3 points is a clinically meaningful difference.  A difference of 2-3 points in the total score of the Quality of Life Index has been associated with significant improvement in overall quality of life, self-image, physical symptoms, and general health in studies assessing change in quality of life.  PHQ-9: Recent Review Flowsheet Data    [facility] 12/09/2019 01/29/2019 01/14/2018 01/10/2017 12/29/2015   Decreased Interest 0 0 0 0 0   Down, Depressed, Hopeless 0 0 0 0 0   PHQ  - 2 Score 0 0 0 0 0     Interpretation of Total Score  Total Score Depression Severity:  1-4 = Minimal depression, 5-9 = Mild depression, 10-14 = Moderate depression, 15-19 = Moderately severe depression, 20-27 = Severe depression   Psychosocial Evaluation and Intervention:   Psychosocial Re-Evaluation:   Psychosocial Discharge (Final Psychosocial Re-Evaluation):   Vocational Rehabilitation: Provide vocational rehab assistance to qualifying candidates.   Vocational Rehab Evaluation & Intervention:  Vocational Rehab - 12/09/19 100086  Initial Vocational Rehab Evaluation & Intervention   Assessment shows need for Vocational Rehabilitation No   [name] is retired and does not need vocational rehab at this time          Education: Education Goals: Education classes will be provided on a weekly basis, covering required topics. Participant will state understanding/return demonstration of topics presented.  Learning Barriers/Preferences:  Learning Barriers/Preferences - 12/09/19 1145      Learning Barriers/Preferences   Learning Barriers Sight   waers glasses   Learning Preferences Written Material;Computer/Internet           Education Topics: Hypertension, Hypertension Reduction -Define heart disease and high blood pressure. Discus how high blood pressure affects the body and ways to reduce high blood pressure.   Exercise and Your Heart -Discuss why it is important to exercise, the FITT principles of exercise, normal and abnormal responses to exercise, and how to exercise safely.   Angina -Discuss definition of angina, causes of angina, treatment of angina, and how to decrease risk of having angina.   Cardiac Medications -Review what the following cardiac medications are used for, how they affect the body, and side effects that may occur when taking the medications.  Medications include Aspirin, Beta blockers, calcium channel blockers, ACE Inhibitors, angiotensin  receptor blockers, diuretics, digoxin, and antihyperlipidemics.   Congestive Heart Failure -Discuss the definition of CHF, how to live with CHF, the signs and symptoms of CHF, and how keep track of weight and sodium intake.   Heart Disease and Intimacy -Discus the effect sexual activity has on the heart, how changes occur during intimacy as we age, and safety during sexual activity.   Smoking Cessation / COPD -Discuss different methods to quit smoking, the health benefits of quitting smoking, and the definition of COPD.   Nutrition I: Fats -Discuss the types of cholesterol, what cholesterol does to the heart, and how cholesterol levels can be controlled.   Nutrition II: Labels -Discuss the different components of food labels and how to read food label   Heart Parts/Heart Disease and PAD -Discuss the anatomy of the heart, the pathway of blood circulation through the heart, and these are affected by heart disease.   Stress I: Signs and Symptoms -Discuss the causes of stress, how stress may lead to anxiety and depression, and ways to limit stress.   Stress II: Relaxation -Discuss different types of relaxation techniques to limit stress.   Warning Signs of Stroke / TIA -Discuss definition of a stroke, what the signs and symptoms are of a stroke, and how to identify when someone is having stroke.   Knowledge Questionnaire Score:  Knowledge Questionnaire Score - 12/09/19 1145      Knowledge Questionnaire Score   Pre Score 15/24           Core Components/Risk Factors/Patient Goals at Admission:  Personal Goals and Risk Factors at Admission - 12/09/19 1147      Core Components/Risk Factors/Patient Goals on Admission    Weight Management Yes;Weight Loss    Intervention Weight Management: Develop a combined nutrition and exercise program designed to reach desired caloric intake, while maintaining appropriate intake of nutrient and fiber, sodium and fats, and appropriate  energy expenditure required for the weight goal.;Weight Management: Provide education and appropriate resources to help participant work on and attain dietary goals.;Weight Management/Obesity: Establish reasonable short term and long term weight goals.    Admit Weight 165 lb 12.6 oz (75.2 kg)    Expected Outcomes Short Term: Continue  to assess and modify interventions until short term weight is achieved;Long Term: Adherence to nutrition and physical activity/exercise program aimed toward attainment of established weight goal;Weight Maintenance: Understanding of the daily nutrition guidelines, which includes 25-35% calories from fat, 7% or less cal from saturated fats, less than 244m cholesterol, less than 1.5gm of sodium, & 5 or more servings of fruits and vegetables daily;Weight Loss: Understanding of general recommendations for a balanced deficit meal plan, which promotes 1-2 lb weight loss per week and includes a negative energy balance of [phone] kcal/d;Understanding recommendations for meals to include 15-35% energy as protein, 25-35% energy from fat, 35-60% energy from carbohydrates, less than 2019mof dietary cholesterol, 20-35 gm of total fiber daily;Understanding of distribution of calorie intake throughout the day with the consumption of 4-5 meals/snacks    Diabetes Yes    Intervention Provide education about signs/symptoms and action to take for hypo/hyperglycemia.;Provide education about proper nutrition, including hydration, and aerobic/resistive exercise prescription along with prescribed medications to achieve blood glucose in normal ranges: Fasting glucose 65-99 mg/dL    Expected Outcomes Short Term: Participant verbalizes understanding of the signs/symptoms and immediate care of hyper/hypoglycemia, proper foot care and importance of medication, aerobic/resistive exercise and nutrition plan for blood glucose control.;Long Term: Attainment of HbA1C < 7%.    Hypertension Yes    Intervention  Provide education on lifestyle modifcations including regular physical activity/exercise, weight management, moderate sodium restriction and increased consumption of fresh fruit, vegetables, and low fat dairy, alcohol moderation, and smoking cessation.;Monitor prescription use compliance.    Expected Outcomes Short Term: Continued assessment and intervention until BP is < 140/9078mG in hypertensive participants. < 130/64m49m in hypertensive participants with diabetes, heart failure or chronic kidney disease.;Long Term: Maintenance of blood pressure at goal levels.    Lipids Yes    Intervention Provide education and support for participant on nutrition & aerobic/resistive exercise along with prescribed medications to achieve LDL <70mg60mL >40mg.61mExpected Outcomes Short Term: Participant states understanding of desired cholesterol values and is compliant with medications prescribed. Participant is following exercise prescription and nutrition guidelines.;Long Term: Cholesterol controlled with medications as prescribed, with individualized exercise RX and with personalized nutrition plan. Value goals: LDL < 70mg, 71m> 40 mg.           Core Components/Risk Factors/Patient Goals Review:   Goals and Risk Factor Review    Row Name 12/09/19 1042             Core Components/Risk Factors/Patient Goals Review   Personal Goals Review --              Core Components/Risk Factors/Patient Goals at Discharge (Final Review):   Goals and Risk Factor Review - 12/09/19 1042      Core Components/Risk Factors/Patient Goals Review   Personal Goals Review --           ITP Comments:  ITP Comments    Row Name 12/09/19 1038           ITP Comments Dr Traci TFransico Himdical Director              Comments: Patient attended orientation on 12/09/2019 to review rules and guidelines for program.  Completed 6 minute walk test, Intitial ITP, and exercise prescription.  VSS. Telemetry-Sinus  Rhythm. Gwen stopped and had to rest for 47 seconds due to fatigue and resumed the test without difficulty.Gwen did  Report having mild shortness of breath. Gwen's hands were also cold  upon completion of her walk test. Oxygen saturations originally registered at 90% on RA.. Safety measures and social distancing in place per CDC guidelines.Barnet Pall, RN,BSN 12/09/2019 12:25 PM

## 2019-12-15 ENCOUNTER — Encounter (HOSPITAL_COMMUNITY)
Admission: RE | Admit: 2019-12-15 | Discharge: 2019-12-15 | Disposition: A | Discharge status: Home / Self Care | Payer: Medicare HMO | Source: Ambulatory Visit | Attending: Internal Medicine | Admitting: Internal Medicine

## 2019-12-15 ENCOUNTER — Other Ambulatory Visit: Payer: Self-pay

## 2019-12-15 DIAGNOSIS — Z79899 Other long term (current) drug therapy: Secondary | ICD-10-CM | POA: Diagnosis not present

## 2019-12-15 DIAGNOSIS — Z951 Presence of aortocoronary bypass graft: Secondary | ICD-10-CM | POA: Diagnosis not present

## 2019-12-15 LAB — GLUCOSE, CAPILLARY
Glucose-Capillary: 198 mg/dL — ABNORMAL HIGH (ref 70–99)
Glucose-Capillary: 155 mg/dL — ABNORMAL HIGH (ref 70–99)

## 2019-12-15 NOTE — Progress Notes (Signed)
Daily Session Note  Patient Details  Name: Jessica Hart MRN: [id number] Date of Birth: 03/16/1940 Referring Provider:     CARDIAC REHAB PHASE II ORIENTATION from 12/09/2019 in [place]  Referring Provider Gayatri A. Margaretann Loveless, MD      Encounter Date: 12/15/2019  Check In:  Session Check In - 12/15/19 0904      Check-In   Supervising physician immediately available to respond to emergencies [facility] immediately available    Physician(s) Dr. Lonny Prude    Location [facility]    Staff Present Barnet Pall, RN, Milus Glazier, MS, EP-C, CCRP;Olinty Celesta Aver, MS, ACSM CEP, Exercise Physiologist;Tyara Carol Ada, MS,ACSM CEP, Exercise Physiologist    Virtual Visit No    Medication changes reported     No    Fall or balance concerns reported    No    Tobacco Cessation No Change    Warm-up and Cool-down Performed on first and last piece of equipment    Resistance Training Performed Yes    VAD Patient? No    PAD/SET Patient? No      Pain Assessment   Currently in Pain? No/denies    Pain Score 0-No pain    Multiple Pain Sites No           Capillary Blood Glucose: No results found for this or any previous visit (from the past 24 hour(s)).   Exercise Prescription Changes - 12/15/19 1000      Response to Exercise   Blood Pressure (Admit) 148/72    Blood Pressure (Exercise) 142/70    Blood Pressure (Exit) 122/60    Heart Rate (Admit) 84 bpm    Heart Rate (Exercise) 102 bpm    Heart Rate (Exit) 84 bpm    Rating of Perceived Exertion (Exercise) 12    Perceived Dyspnea (Exercise) 0    Symptoms None    Comments Pt's first day of exercise    Duration Progress to 10 minutes continuous walking  at current work load and total walking time to 30-45 min    Intensity THRR unchanged      Progression   Progression Continue to progress workloads to maintain intensity without signs/symptoms of physical distress.    Average  METs 1.9      Resistance Training   Training Prescription Yes    Weight 2lbs    Reps 10-15    Time 10 Minutes      Interval Training   Interval Training No      NuStep   Level 2    SPM 75    Minutes 15    METs 1.9      Track   Laps 10    Minutes 15    METs 1.9           Social History   Tobacco Use  Smoking Status Former Smoker  . Packs/day: 1.00  . Years: 45.00  . Pack years: 45.00  . Quit date: 07/27/2005  . Years since quitting: 14.3  Smokeless Tobacco Former Systems developer  . Quit date: 03/2005    Goals Met:  Exercise tolerated well No report of cardiac concerns or symptoms Strength training completed today  Goals Unmet:  Not Applicable  Comments: Jessica Hart started cardiac rehab today.  Pt tolerated light exercise without difficulty. VSS, telemetry-Sinus Rhythm, asymptomatic.  Medication list reconciled. Pt denies barriers to medicaiton compliance.  PSYCHOSOCIAL ASSESSMENT:  PHQ-0. Pt exhibits positive coping skills, hopeful outlook with supportive family. No psychosocial  needs identified at this time, no psychosocial interventions necessary.    Pt enjoys watching TV and playing cards..   Pt oriented to exercise equipment and routine.    Understanding verbalized.Barnet Pall, RN,BSN 12/15/2019 1:30 PM   Dr. Fransico Him is Medical Director for Cardiac Rehab at [facility].

## 2019-12-17 ENCOUNTER — Other Ambulatory Visit: Payer: Self-pay

## 2019-12-17 ENCOUNTER — Encounter (HOSPITAL_COMMUNITY)
Admission: RE | Admit: 2019-12-17 | Discharge: 2019-12-17 | Disposition: A | Discharge status: Home / Self Care | Payer: Medicare HMO | Source: Ambulatory Visit | Attending: Internal Medicine | Admitting: Internal Medicine

## 2019-12-17 DIAGNOSIS — Z951 Presence of aortocoronary bypass graft: Secondary | ICD-10-CM

## 2019-12-17 DIAGNOSIS — Z79899 Other long term (current) drug therapy: Secondary | ICD-10-CM | POA: Diagnosis not present

## 2019-12-17 LAB — GLUCOSE, CAPILLARY
Glucose-Capillary: 207 mg/dL — ABNORMAL HIGH (ref 70–99)
Glucose-Capillary: 139 mg/dL — ABNORMAL HIGH (ref 70–99)

## 2019-12-17 NOTE — Progress Notes (Signed)
Jessica Hart 79 y.o. 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 Arthritis   . Cataract bilaterl 3 years ago  . Coronary artery disease   . Diabetes mellitus 02/27/2005  . Diabetes mellitus type 2, controlled, without complications ([facility]) 9/47/6546   Qualifier: Diagnosis of  By: Drucie Ip    . Diverticulosis   . Dyspnea   . Echocardiogram findings abnormal, without diagnosis 2005   EF 47%, no ischemia, no infarction  . Former smoker 01/10/2017   45 pack year history, quit in 2007   . GERD (gastroesophageal reflux disease)   . Heart murmur   . History of bone density study 2006   Lt hip = -1.0, L spine = -1.8   . Hyperlipidemia   . Hypertension      Medications reviewed.   Current Outpatient Medications:  .  acetaminophen (TYLENOL) 500 MG tablet, Take 2 tablets (1,000 mg total) by mouth every 8 (eight) hours as needed., Disp: 30 tablet, Rfl: 2 .  amLODipine (NORVASC) 5 MG tablet, Take 1 tablet (5 mg total) by mouth daily., Disp: 90 tablet, Rfl: 3 .  aspirin EC 325 MG EC tablet, Take 1 tablet (325 mg total) by mouth daily., Disp: 30 tablet, Rfl: 0 .  docusate sodium (COLACE) 50 MG capsule, Take 1 capsule (50 mg total) by mouth daily as needed for mild constipation., Disp: 30 capsule, Rfl: 0 .  FARXIGA 5 MG TABS tablet, TAKE 1 TABLET BY MOUTH EVERY DAY, Disp: 90 tablet, Rfl: 1 .  insulin degludec (TRESIBA FLEXTOUCH) 100 UNIT/ML SOPN FlexTouch Pen, Inject 0.1 mLs (10 Units total) into the skin daily after supper., Disp: 2 pen, Rfl: 0 .  Insulin Pen Needle (PEN NEEDLES 5/16") 30G X 8 MM MISC, Pen needles used for lantus injections, Disp: 100 each, Rfl: 3 .  Lancets (ONETOUCH DELICA PLUS TKPTWS56C) MISC, 1 APPLICATION BY OTHER ROUTE DAILY. TEST BLOOD SUGAR ONCE A DAY, Disp: 100 each, Rfl: 1 .  lipase/protease/amylase (CREON) 36000 UNITS CPEP capsule, Take 2 capsules with each meal three times daily  Take 1 capsule  with each snack twice daily (Patient taking differently: Take 36,000-72,000 Units by mouth See admin instructions. Take 2 capsules with each meal three times daily  Take 1 capsule with each snack twice daily), Disp: 240 capsule, Rfl: 11 .  lovastatin (MEVACOR) 20 MG tablet, TAKE ONE TABLET BY MOUTH ONCE DAILY, Disp: 90 tablet, Rfl: 1 .  metFORMIN (GLUCOPHAGE) 1000 MG tablet, TAKE 1 TABLET BY MOUTH TWICE DAILY WITH A MEAL, Disp: 180 tablet, Rfl: 0 .  Metoprolol Tartrate 37.5 MG TABS, Take 37.5 mg by mouth 2 (two) times daily., Disp: 90 tablet, Rfl: 3 .  omeprazole (PRILOSEC) 40 MG capsule, TAKE 1 CAPSULE BY MOUTH EVERY DAY, Disp: 90 capsule, Rfl: 0 .  ONETOUCH VERIO test strip, USE 1 STRIP TO CHECK GLUCOSE ONCE DAILY, Disp: 100 strip, Rfl: 1 .  polyethylene glycol (MIRALAX / GLYCOLAX) 17 g packet, Take 17 g by mouth daily as needed for mild constipation., Disp: 14 each, Rfl: 1 .  tobramycin (TOBREX) 0.3 % ophthalmic solution, Place 1 drop into the left eye every morning.  (Patient not taking: Reported on 12/04/2019), Disp: , Rfl:    Ht Readings from Last 1 Encounters:  12/09/19 5' 3.25" (1.607 m)     Wt Readings from Last 3 Encounters:  12/09/19 165 lb 12.6 oz (75.2 kg)  10/17/19 161  lb 8 oz (73.3 kg)  10/09/19 162 lb 3.2 oz (73.6 kg)     There is no height or weight on file to calculate BMI.   Social History   Tobacco Use  Smoking Status Former Smoker  . Packs/day: 1.00  . Years: 45.00  . Pack years: 45.00  . Quit date: 07/27/2005  . Years since quitting: 14.4  Smokeless Tobacco Former Systems developer  . Quit date: 03/2005     Lab Results  Component Value Date   CHOL 153 01/29/2019   Lab Results  Component Value Date   HDL 38 (L) 01/29/2019   Lab Results  Component Value Date   LDLCALC 79 01/29/2019   Lab Results  Component Value Date   TRIG 216 (H) 01/29/2019     Lab Results  Component Value Date   HGBA1C 6.7 (H) 08/29/2019     CBG (last 3)  Recent Labs     12/15/19 0900 12/15/19 1004  GLUCAP 198* 155*     Nutrition Note  Spoke with pt. Nutrition Plan and Nutrition Survey goals reviewed with pt.   Pt has Type 2 Diabetes. Last A1c indicates blood glucose well-controlled.  Pt checks CBG's 1 times a day. Fasting CBG's reportedly 115-130 mg/dL.   Per discussion, pt does use canned/convenience foods often.  Pt does not eat out frequently.   5% weight loss following CABG. Weight loss stabilized. She has since gained 5 lbs. Reports normal appetite. She cooks at home 3 nights per week. She often chooses convenience foods for her and husband to eat. She eats 3 meals per day. Sometimes she gets fast food for lunch. She states goal of being able to prepare a heart healthy meal at graduation from rehab. She feels her cooking skills could be improved.  She does not use much technology (ie likely will not watch videos on phone).   Pt expressed understanding of the information reviewed.    Nutrition Diagnosis ? Food-and nutrition-related knowledge deficit related to lack of exposure to information as related to diagnosis of: ? CVD ? Type 2 Diabetes  Nutrition Intervention ? Pt's individual nutrition plan reviewed with pt. ? Benefits of adopting Heart Healthy diet discussed when [employer] reviewed. ? Continue client-centered nutrition education by RD, as part of interdisciplinary care.  Goal(s) ? Pt to build a healthy plate including vegetables, fruits, whole grains, and low-fat dairy products in a heart healthy meal plan. ? Pt to learn basic cooking skills  Plan:   Will provide client-centered nutrition education as part of interdisciplinary care  Monitor and evaluate progress toward nutrition goal with team.   Michaele Offer, MS, RDN, LDN

## 2019-12-19 ENCOUNTER — Other Ambulatory Visit: Payer: Self-pay

## 2019-12-19 ENCOUNTER — Encounter (HOSPITAL_COMMUNITY)
Admission: RE | Admit: 2019-12-19 | Discharge: 2019-12-19 | Disposition: A | Discharge status: Home / Self Care | Payer: Medicare HMO | Source: Ambulatory Visit | Attending: Internal Medicine | Admitting: Internal Medicine

## 2019-12-19 DIAGNOSIS — Z79899 Other long term (current) drug therapy: Secondary | ICD-10-CM | POA: Diagnosis not present

## 2019-12-19 DIAGNOSIS — Z951 Presence of aortocoronary bypass graft: Secondary | ICD-10-CM | POA: Diagnosis not present

## 2019-12-22 ENCOUNTER — Encounter (HOSPITAL_COMMUNITY)
Admission: RE | Admit: 2019-12-22 | Discharge: 2019-12-22 | Disposition: A | Discharge status: Home / Self Care | Payer: Medicare HMO | Source: Ambulatory Visit | Attending: Internal Medicine | Admitting: Internal Medicine

## 2019-12-22 ENCOUNTER — Other Ambulatory Visit: Payer: Self-pay

## 2019-12-22 DIAGNOSIS — Z79899 Other long term (current) drug therapy: Secondary | ICD-10-CM | POA: Diagnosis not present

## 2019-12-22 DIAGNOSIS — Z951 Presence of aortocoronary bypass graft: Secondary | ICD-10-CM | POA: Diagnosis not present

## 2019-12-24 ENCOUNTER — Encounter (HOSPITAL_COMMUNITY)
Admission: RE | Admit: 2019-12-24 | Discharge: 2019-12-24 | Disposition: A | Discharge status: Home / Self Care | Payer: Medicare HMO | Source: Ambulatory Visit | Attending: Internal Medicine | Admitting: Internal Medicine

## 2019-12-24 ENCOUNTER — Other Ambulatory Visit: Payer: Self-pay

## 2019-12-24 DIAGNOSIS — Z951 Presence of aortocoronary bypass graft: Secondary | ICD-10-CM | POA: Diagnosis not present

## 2019-12-24 DIAGNOSIS — Z79899 Other long term (current) drug therapy: Secondary | ICD-10-CM | POA: Diagnosis not present

## 2019-12-24 NOTE — Progress Notes (Signed)
Cardiac Individual Treatment Plan  Patient Details  Name: Jessica Hart MRN: [id number] Date of Birth: 1940/03/16 Referring Provider:     CARDIAC REHAB PHASE II ORIENTATION from 12/09/2019 in [place]  Referring Provider Gayatri A. 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  acetaminophen (TYLENOL) 500 MG tablet, Take 2 tablets (1,000 mg total) by mouth every 8 (eight) hours as needed., Disp: 30 tablet, Rfl: 2 .  amLODipine (NORVASC) 5 MG tablet, Take 1 tablet (5 mg total) by mouth daily., Disp: 90 tablet, Rfl: 3 .  aspirin EC 325 MG EC tablet, Take 1 tablet (325 mg total) by mouth daily., Disp: 30 tablet, Rfl: 0 .  docusate sodium (COLACE) 50 MG capsule, Take 1 capsule (50 mg total) by mouth daily as needed for mild constipation., Disp: 30 capsule, Rfl: 0 .  FARXIGA 5 MG TABS tablet, TAKE 1 TABLET BY MOUTH EVERY DAY, Disp: 90 tablet, Rfl: 1 .  insulin degludec (TRESIBA FLEXTOUCH) 100 UNIT/ML SOPN FlexTouch Pen, Inject 0.1 mLs (10 Units total) into the skin daily after supper., Disp: 2 pen, Rfl: 0 .  Insulin Pen Needle (PEN NEEDLES 5/16") 30G X 8 MM MISC, Pen needles used for lantus injections, Disp: 100 each, Rfl: 3 .  Lancets (ONETOUCH DELICA PLUS [id number]) MISC, 1 APPLICATION BY OTHER ROUTE DAILY. TEST BLOOD SUGAR ONCE A DAY, Disp: 100 each, Rfl: 1 .  lipase/protease/amylase (CREON) 36000 UNITS CPEP capsule, Take 2 capsules with each meal three times daily  Take 1 capsule with each snack twice daily (Patient taking differently: Take 36,000-72,000 Units by mouth See admin instructions. Take 2 capsules with each meal three times daily  Take 1 capsule with each snack twice daily), Disp: 240 capsule, Rfl: 11 .   lovastatin (MEVACOR) 20 MG tablet, TAKE ONE TABLET BY MOUTH ONCE DAILY, Disp: 90 tablet, Rfl: 1 .  metFORMIN (GLUCOPHAGE) 1000 MG tablet, TAKE 1 TABLET BY MOUTH TWICE DAILY WITH A MEAL, Disp: 180 tablet, Rfl: 0 .  Metoprolol Tartrate 37.5 MG TABS, Take 37.5 mg by mouth 2 (two) times daily., Disp: 90 tablet, Rfl: 3 .  omeprazole (PRILOSEC) 40 MG capsule, TAKE 1 CAPSULE BY MOUTH EVERY DAY, Disp: 90 capsule, Rfl: 0 .  ONETOUCH VERIO test strip, USE 1 STRIP TO CHECK GLUCOSE ONCE DAILY, Disp: 100 strip, Rfl: 1 .  polyethylene glycol (MIRALAX / GLYCOLAX) 17 g packet, Take 17 g by mouth daily as needed for mild constipation., Disp: 14 each, Rfl: 1 .  tobramycin (TOBREX) 0.3 % ophthalmic solution, Place 1 drop into the left eye every morning.  (Patient not taking: Reported on 12/04/2019), Disp: , Rfl:   Past Medical History: Past Medical History:  Diagnosis Date  . Arthritis   . Cataract bilaterl 3 years ago  . Coronary artery disease   . Diabetes mellitus 02/27/2005  . Diabetes mellitus type 2, controlled, without complications ([place]) 06/15/6220   Qualifier: Diagnosis of  By: Drucie Ip    . Diverticulosis   . Dyspnea   . Echocardiogram findings abnormal, without diagnosis 2005   EF 47%, no ischemia, no infarction  . Former smoker 01/10/2017   45 pack year history, quit in 2007   . GERD (gastroesophageal  reflux disease)   . Heart murmur   . History of bone density study 2006   Lt hip = -1.0, L spine = -1.8   . Hyperlipidemia   . Hypertension     Tobacco Use: Social History   Tobacco Use  Smoking Status Former Smoker  . Packs/day: 1.00  . Years: 45.00  . Pack years: 45.00  . Quit date: 07/27/2005  . Years since quitting: 14.4  Smokeless Tobacco Former Systems developer  . Quit date: 03/2005    Labs: Recent Review Flowsheet Data    Labs for ITP Cardiac and Pulmonary Rehab Latest Ref Rng & Units 09/02/2019 09/02/2019 09/02/2019 09/03/2019 09/04/2019   Cholestrol 100 - 199 mg/dL - - - - -   LDLCALC 0 -  99 mg/dL - - - - -   LDLDIRECT mg/dL - - - - -   HDL >39 mg/dL - - - - -   Trlycerides 0 - 149 mg/dL - - - - -   Hemoglobin A1c 4.8 - 5.6 % - - - - -   PHART 7.35 - 7.45 7.326(L) 7.397 7.374 - -   PCO2ART 32 - 48 mmHg 42.3 32.0 34.9 - -   HCO3 20.0 - 28.0 mmol/L 22.6 19.8(L) 20.3 - -   TCO2 22 - 32 mmol/L 24 21(L) 21(L) - -   ACIDBASEDEF 0.0 - 2.0 mmol/L 4.0(H) 4.0(H) 4.0(H) - -   O2SAT % 90.0 93.0 93.0 53.9 69.8      Capillary Blood Glucose: Lab Results  Component Value Date   GLUCAP 139 (H) 12/17/2019   GLUCAP 207 (H) 12/17/2019   GLUCAP 155 (H) 12/15/2019   GLUCAP 198 (H) 12/15/2019   GLUCAP 118 (H) 09/07/2019     Exercise Target Goals: Exercise Program Goal: Individual exercise prescription set using results from initial 6 min walk test and THRR while considering  patient's activity barriers and safety.   Exercise Prescription Goal: Starting with aerobic activity 30 plus minutes a day, 3 days per week for initial exercise prescription. Provide home exercise prescription and guidelines that participant acknowledges understanding prior to discharge.  Activity Barriers & Risk Stratification:  Activity Barriers & Cardiac Risk Stratification - 12/09/19 1152      Activity Barriers & Cardiac Risk Stratification   Activity Barriers Arthritis    Cardiac Risk Stratification High           6 Minute Walk:  6 Minute Walk    Row Name 12/09/19 1050         6 Minute Walk   Phase Initial     Distance 1097 feet     Walk Time 6 minutes     # of Rest Breaks 1  Took 47 second rest break (3:56 - 4:43).     MPH 2.1     METS 2.1     RPE 13     Perceived Dyspnea  1     VO2 Peak 7.3     Symptoms Yes (comment)     Comments SOB, RPD = 1     Resting HR 84 bpm     Resting BP 124/72     Resting Oxygen Saturation  95 %     Exercise Oxygen Saturation  during 6 min walk 90 %  Pt's hands were cold. Warmed hands.     Max Ex. HR 101 bpm     Max Ex. BP 140/70     2 Minute Post BP  128/70  Oxygen Initial Assessment:   Oxygen Re-Evaluation:   Oxygen Discharge (Final Oxygen Re-Evaluation):   Initial Exercise Prescription:  Initial Exercise Prescription - 12/09/19 1100      Date of Initial Exercise RX and Referring Provider   Date 12/09/19    Referring Provider Gayatri A. Margaretann Loveless, MD    Expected Discharge Date 02/06/20      NuStep   Level 2    SPM 75    Minutes 30    METs 2      Prescription Details   Frequency (times per week) 3    Duration Progress to 30 minutes of continuous aerobic without signs/symptoms of physical distress      Intensity   THRR 40-80% of Max Heartrate 57-114    Ratings of Perceived Exertion 11-13    Perceived Dyspnea 0-4      Progression   Progression Continue progressive overload as per policy without signs/symptoms or physical distress.      Resistance Training   Training Prescription Yes    Weight 2 lbs    Reps 10-15           Perform Capillary Blood Glucose checks as needed.  Exercise Prescription Changes:   Exercise Prescription Changes    Row Name 12/15/19 1000 12/24/19 1127           Response to Exercise   Blood Pressure (Admit) 148/72 126/60      Blood Pressure (Exercise) 142/70 142/84      Blood Pressure (Exit) 122/60 120/78      Heart Rate (Admit) 84 bpm 97 bpm      Heart Rate (Exercise) 102 bpm 99 bpm      Heart Rate (Exit) 84 bpm 86 bpm      Rating of Perceived Exertion (Exercise) 12 13      Perceived Dyspnea (Exercise) 0 0      Symptoms None None      Comments Pt's first day of exercise None      Duration Progress to 10 minutes continuous walking  at current work load and total walking time to 30-45 min Progress to 30 minutes of  aerobic without signs/symptoms of physical distress      Intensity THRR unchanged THRR unchanged        Progression   Progression Continue to progress workloads to maintain intensity without signs/symptoms of physical distress. Continue to progress  workloads to maintain intensity without signs/symptoms of physical distress.      Average METs 1.9 2.5        Resistance Training   Training Prescription Yes No      Weight 2lbs --      Reps 10-15 --      Time 10 Minutes --        Interval Training   Interval Training No No        NuStep   Level 2 2      SPM 75 90      Minutes 15 15      METs 1.9 2.6        Track   Laps 10 13      Minutes 15 15      METs 1.9 2.51             Exercise Comments:   Exercise Comments    Row Name 12/15/19 1102           Exercise Comments Pt's first day of exercise. Pt reported knee discomfort with Nustep and walking  the track. Pt able to exercise with multiple rest breaks due to fatigue. Will continue to work with pt to help increase strength and stamina.              Exercise Goals and Review:   Exercise Goals    Row Name 12/09/19 1154             Exercise Goals   Increase Physical Activity Yes       Expected Outcomes Short Term: Attend rehab on a regular basis to increase amount of physical activity.;Long Term: Add in home exercise to make exercise part of routine and to increase amount of physical activity.;Long Term: Exercising regularly at least 3-5 days a week.       Increase Strength and Stamina Yes       Intervention Provide advice, education, support and counseling about physical activity/exercise needs.;Develop an individualized exercise prescription for aerobic and resistive training based on initial evaluation findings, risk stratification, comorbidities and participant's personal goals.       Expected Outcomes Short Term: Increase workloads from initial exercise prescription for resistance, speed, and METs.;Short Term: Perform resistance training exercises routinely during rehab and add in resistance training at home;Long Term: Improve cardiorespiratory fitness, muscular endurance and strength as measured by increased METs and functional capacity (6MWT)       Able to  understand and use rate of perceived exertion (RPE) scale Yes       Intervention Provide education and explanation on how to use RPE scale       Expected Outcomes Short Term: Able to use RPE daily in rehab to express subjective intensity level;Long Term:  Able to use RPE to guide intensity level when exercising independently       Knowledge and understanding of Target Heart Rate Range (THRR) Yes       Intervention Provide education and explanation of THRR including how the numbers were predicted and where they are located for reference       Expected Outcomes Short Term: Able to state/look up THRR;Short Term: Able to use daily as guideline for intensity in rehab;Long Term: Able to use THRR to govern intensity when exercising independently       Understanding of Exercise Prescription Yes       Intervention Provide education, explanation, and written materials on patient's individual exercise prescription       Expected Outcomes Short Term: Able to explain program exercise prescription;Long Term: Able to explain home exercise prescription to exercise independently              Exercise Goals Re-Evaluation :  Exercise Goals Re-Evaluation    [place] Name 12/15/19 1104             Exercise Goal Re-Evaluation   Exercise Goals Review Increase Physical Activity;Increase Strength and Stamina;Able to understand and use rate of perceived exertion (RPE) scale       Comments Pt's first day of exercise. Pt tolerated first day of exercise fairly okay. Pt requested to add walking track to prescription. Will continue to monitor knee discomfort. Pt oriented to exercise equipment.       Expected Outcomes Pt will continue to increase strength and stamina. Pt will work towards walking for 15 minutes with minimal rest breaks.               Discharge Exercise Prescription (Final Exercise Prescription Changes):  Exercise Prescription Changes - 12/24/19 1127      Response to Exercise   Blood Pressure (  Admit)  126/60    Blood Pressure (Exercise) 142/84    Blood Pressure (Exit) 120/78    Heart Rate (Admit) 97 bpm    Heart Rate (Exercise) 99 bpm    Heart Rate (Exit) 86 bpm    Rating of Perceived Exertion (Exercise) 13    Perceived Dyspnea (Exercise) 0    Symptoms None    Comments None    Duration Progress to 30 minutes of  aerobic without signs/symptoms of physical distress    Intensity THRR unchanged      Progression   Progression Continue to progress workloads to maintain intensity without signs/symptoms of physical distress.    Average METs 2.5      Resistance Training   Training Prescription No    Weight --    Reps --    Time --      Interval Training   Interval Training No      NuStep   Level 2    SPM 90    Minutes 15    METs 2.6      Track   Laps 13    Minutes 15    METs 2.51           Nutrition:  Target Goals: Understanding of nutrition guidelines, daily intake of sodium <1555m, cholesterol <2034m calories 30% from fat and 7% or less from saturated fats, daily to have 5 or more servings of fruits and vegetables.  Biometrics:  Pre Biometrics - 12/09/19 1000      Pre Biometrics   Waist Circumference 39 inches    Hip Circumference 41 inches    Waist to Hip Ratio 0.95 %    Triceps Skinfold 32 mm    % Body Fat 42.7 %    Grip Strength 37 kg    Flexibility 15.5 in    Single Leg Stand 6.06 seconds   Moderate risk for fall           Nutrition Therapy Plan and Nutrition Goals:  Nutrition Therapy & Goals - 12/17/19 1027      Nutrition Therapy   Diet heart Healthy/carb mod    Drug/Food Interactions Statins/Certain Fruits      Personal Nutrition Goals   Nutrition Goal Pt to build a healthy plate including vegetables, fruits, whole grains, and low-fat dairy products in a heart healthy meal plan.    Personal Goal #2 Pt to learn basic cooking skills      Intervention Plan   Intervention Prescribe, educate and counsel regarding individualized specific dietary  modifications aiming towards targeted core components such as weight, hypertension, lipid management, diabetes, heart failure and other comorbidities.;Nutrition handout(s) given to patient.    Expected Outcomes Short Term Goal: Understand basic principles of dietary content, such as calories, fat, sodium, cholesterol and nutrients.           Nutrition Assessments:   Nutrition Goals Re-Evaluation:  Nutrition Goals Re-Evaluation    [place]ame 12/17/19 1032             Goals   Current Weight 165 lb (74.8 kg)       Nutrition Goal Pt to build a healthy plate including vegetables, fruits, whole grains, and low-fat dairy products in a heart healthy meal plan.         Personal Goal #2 Re-Evaluation   Personal Goal #2 Pt to learn basic cooking skills              Nutrition Goals Discharge (Final Nutrition Goals Re-Evaluation):  Nutrition Goals Re-Evaluation - 12/17/19 1032      Goals   Current Weight 165 lb (74.8 kg)    Nutrition Goal Pt to build a healthy plate including vegetables, fruits, whole grains, and low-fat dairy products in a heart healthy meal plan.      Personal Goal #2 Re-Evaluation   Personal Goal #2 Pt to learn basic cooking skills           Psychosocial: Target Goals: Acknowledge presence or absence of significant depression and/or stress, maximize coping skills, provide positive support system. Participant is able to verbalize types and ability to use techniques and skills needed for reducing stress and depression.  Initial Review & Psychosocial Screening:  Initial Psych Review & Screening - 12/24/19 1630      Family Dynamics   Concerns No support system           Quality of Life Scores:  Quality of Life - 12/09/19 1144      Quality of Life   Select Quality of Life      Quality of Life Scores   Health/Function Pre 26.03 %    Socioeconomic Pre 23.17 %    Psych/Spiritual Pre 29.14 %    Family Pre 27.6 %    GLOBAL Pre 26.41 %          Scores  of 19 and below usually indicate a poorer quality of life in these areas.  A difference of  2-3 points is a clinically meaningful difference.  A difference of 2-3 points in the total score of the Quality of Life Index has been associated with significant improvement in overall quality of life, self-image, physical symptoms, and general health in studies assessing change in quality of life.  PHQ-9: Recent Review Flowsheet Data    Depression screen [facility] 2/9 12/09/2019 01/29/2019 01/14/2018 01/10/2017 12/29/2015   Decreased Interest 0 0 0 0 0   Down, Depressed, Hopeless 0 0 0 0 0   PHQ - 2 Score 0 0 0 0 0     Interpretation of Total Score  Total Score Depression Severity:  1-4 = Minimal depression, 5-9 = Mild depression, 10-14 = Moderate depression, 15-19 = Moderately severe depression, 20-27 = Severe depression   Psychosocial Evaluation and Intervention:   Psychosocial Re-Evaluation:  Psychosocial Re-Evaluation    Row Name 12/24/19 1630             Psychosocial Re-Evaluation   Current issues with None Identified       Interventions Encouraged to attend Cardiac Rehabilitation for the exercise       Continue Psychosocial Services  No Follow up required              Psychosocial Discharge (Final Psychosocial Re-Evaluation):  Psychosocial Re-Evaluation - 12/24/19 1630      Psychosocial Re-Evaluation   Current issues with None Identified    Interventions Encouraged to attend Cardiac Rehabilitation for the exercise    Continue Psychosocial Services  No Follow up required           Vocational Rehabilitation: Provide vocational rehab assistance to qualifying candidates.   Vocational Rehab Evaluation & Intervention:  Vocational Rehab - 12/09/19 1039      Initial Vocational Rehab Evaluation & Intervention   Assessment shows need for Vocational Rehabilitation No   [name] is retired and does not need vocational rehab at this time          Education: Education Goals: Education  classes will be provided on a weekly basis,  covering required topics. Participant will state understanding/return demonstration of topics presented.  Learning Barriers/Preferences:  Learning Barriers/Preferences - 12/09/19 1145      Learning Barriers/Preferences   Learning Barriers Sight   waers glasses   Learning Preferences Written Material;Computer/Internet           Education Topics: Hypertension, Hypertension Reduction -Define heart disease and high blood pressure. Discus how high blood pressure affects the body and ways to reduce high blood pressure.   Exercise and Your Heart -Discuss why it is important to exercise, the FITT principles of exercise, normal and abnormal responses to exercise, and how to exercise safely.   Angina -Discuss definition of angina, causes of angina, treatment of angina, and how to decrease risk of having angina.   Cardiac Medications -Review what the following cardiac medications are used for, how they affect the body, and side effects that may occur when taking the medications.  Medications include Aspirin, Beta blockers, calcium channel blockers, ACE Inhibitors, angiotensin receptor blockers, diuretics, digoxin, and antihyperlipidemics.   Congestive Heart Failure -Discuss the definition of CHF, how to live with CHF, the signs and symptoms of CHF, and how keep track of weight and sodium intake.   Heart Disease and Intimacy -Discus the effect sexual activity has on the heart, how changes occur during intimacy as we age, and safety during sexual activity.   Smoking Cessation / COPD -Discuss different methods to quit smoking, the health benefits of quitting smoking, and the definition of COPD.   Nutrition I: Fats -Discuss the types of cholesterol, what cholesterol does to the heart, and how cholesterol levels can be controlled.   Nutrition II: Labels -Discuss the different components of food labels and how to read food label   Heart  Parts/Heart Disease and PAD -Discuss the anatomy of the heart, the pathway of blood circulation through the heart, and these are affected by heart disease.   Stress I: Signs and Symptoms -Discuss the causes of stress, how stress may lead to anxiety and depression, and ways to limit stress.   Stress II: Relaxation -Discuss different types of relaxation techniques to limit stress.   Warning Signs of Stroke / TIA -Discuss definition of a stroke, what the signs and symptoms are of a stroke, and how to identify when someone is having stroke.   Knowledge Questionnaire Score:  Knowledge Questionnaire Score - 12/09/19 1145      Knowledge Questionnaire Score   Pre Score 15/24           Core Components/Risk Factors/Patient Goals at Admission:  Personal Goals and Risk Factors at Admission - 12/09/19 1147      Core Components/Risk Factors/Patient Goals on Admission    Weight Management Yes;Weight Loss    Intervention Weight Management: Develop a combined nutrition and exercise program designed to reach desired caloric intake, while maintaining appropriate intake of nutrient and fiber, sodium and fats, and appropriate energy expenditure required for the weight goal.;Weight Management: Provide education and appropriate resources to help participant work on and attain dietary goals.;Weight Management/Obesity: Establish reasonable short term and long term weight goals.    Admit Weight 165 lb 12.6 oz (75.2 kg)    Expected Outcomes Short Term: Continue to assess and modify interventions until short term weight is achieved;Long Term: Adherence to nutrition and physical activity/exercise program aimed toward attainment of established weight goal;Weight Maintenance: Understanding of the daily nutrition guidelines, which includes 25-35% calories from fat, 7% or less cal from saturated fats, less than 215m cholesterol, less  than 1.5gm of sodium, & 5 or more servings of fruits and vegetables daily;Weight  Loss: Understanding of general recommendations for a balanced deficit meal plan, which promotes 1-2 lb weight loss per week and includes a negative energy balance of [phone] kcal/d;Understanding recommendations for meals to include 15-35% energy as protein, 25-35% energy from fat, 35-60% energy from carbohydrates, less than 29m of dietary cholesterol, 20-35 gm of total fiber daily;Understanding of distribution of calorie intake throughout the day with the consumption of 4-5 meals/snacks    Diabetes Yes    Intervention Provide education about signs/symptoms and action to take for hypo/hyperglycemia.;Provide education about proper nutrition, including hydration, and aerobic/resistive exercise prescription along with prescribed medications to achieve blood glucose in normal ranges: Fasting glucose 65-99 mg/dL    Expected Outcomes Short Term: Participant verbalizes understanding of the signs/symptoms and immediate care of hyper/hypoglycemia, proper foot care and importance of medication, aerobic/resistive exercise and nutrition plan for blood glucose control.;Long Term: Attainment of HbA1C < 7%.    Hypertension Yes    Intervention Provide education on lifestyle modifcations including regular physical activity/exercise, weight management, moderate sodium restriction and increased consumption of fresh fruit, vegetables, and low fat dairy, alcohol moderation, and smoking cessation.;Monitor prescription use compliance.    Expected Outcomes Short Term: Continued assessment and intervention until BP is < 140/963mHG in hypertensive participants. < 130/8041mG in hypertensive participants with diabetes, heart failure or chronic kidney disease.;Long Term: Maintenance of blood pressure at goal levels.    Lipids Yes    Intervention Provide education and support for participant on nutrition & aerobic/resistive exercise along with prescribed medications to achieve LDL <7m82mDL >40mg40m Expected Outcomes Short Term:  Participant states understanding of desired cholesterol values and is compliant with medications prescribed. Participant is following exercise prescription and nutrition guidelines.;Long Term: Cholesterol controlled with medications as prescribed, with individualized exercise RX and with personalized nutrition plan. Value goals: LDL < 7mg,67m > 40 mg.           Core Components/Risk Factors/Patient Goals Review:   Goals and Risk Factor Review    Row Name 12/09/19 1042 12/24/19 1631           Core Components/Risk Factors/Patient Goals Review   Personal Goals Review -- Weight Management/Obesity;Lipids;Hypertension;Diabetes      Review -- Gwen hMeredith Modyad some moderate resting and exertional BP elevation. CBG's have been stable      Expected Outcomes -- Gwen will continue to partcipate in phase 2 cardiac rehab for exercise, nutrtion and lifestyle modifications. Will continue to monitor BP             Core Components/Risk Factors/Patient Goals at Discharge (Final Review):   Goals and Risk Factor Review - 12/24/19 1631      Core Components/Risk Factors/Patient Goals Review   Personal Goals Review Weight Management/Obesity;Lipids;Hypertension;Diabetes    Review Gwen hMeredith Modyad some moderate resting and exertional BP elevation. CBG's have been stable    Expected Outcomes Gwen will continue to partcipate in phase 2 cardiac rehab for exercise, nutrtion and lifestyle modifications. Will continue to monitor BP           ITP Comments:  ITP Comments    Row Name 12/09/19 1038 12/24/19 1629         ITP Comments Dr Traci Fransico Himedical Director 30 Day ITP Review. gwen ahs good attendance and partcipation in phase 2 cardiac rehab  Comments: See ITP comments. Gwen does have chronic knee pain and takes breaks on the walking track as needed.Barnet Pall, RN,BSN 12/25/2019 1:24 PM

## 2019-12-25 ENCOUNTER — Other Ambulatory Visit: Payer: Self-pay | Admitting: Family Medicine

## 2019-12-26 ENCOUNTER — Encounter (HOSPITAL_COMMUNITY)
Admission: RE | Admit: 2019-12-26 | Discharge: 2019-12-26 | Disposition: A | Discharge status: Home / Self Care | Payer: Medicare HMO | Source: Ambulatory Visit | Attending: Internal Medicine | Admitting: Internal Medicine

## 2019-12-26 ENCOUNTER — Ambulatory Visit: Payer: Medicare HMO | Admitting: Internal Medicine

## 2019-12-26 ENCOUNTER — Other Ambulatory Visit: Payer: Self-pay

## 2019-12-26 ENCOUNTER — Encounter: Payer: Self-pay | Admitting: Internal Medicine

## 2019-12-26 VITALS — BP 130/64 | HR 77 | Ht 63.0 in | Wt 166.0 lb

## 2019-12-26 DIAGNOSIS — I10 Essential (primary) hypertension: Secondary | ICD-10-CM | POA: Diagnosis not present

## 2019-12-26 DIAGNOSIS — E1165 Type 2 diabetes mellitus with hyperglycemia: Secondary | ICD-10-CM

## 2019-12-26 DIAGNOSIS — Z79899 Other long term (current) drug therapy: Secondary | ICD-10-CM | POA: Diagnosis not present

## 2019-12-26 DIAGNOSIS — Z794 Long term (current) use of insulin: Secondary | ICD-10-CM

## 2019-12-26 DIAGNOSIS — Z951 Presence of aortocoronary bypass graft: Secondary | ICD-10-CM | POA: Diagnosis not present

## 2019-12-26 DIAGNOSIS — I2510 Atherosclerotic heart disease of native coronary artery without angina pectoris: Secondary | ICD-10-CM

## 2019-12-26 DIAGNOSIS — E1169 Type 2 diabetes mellitus with other specified complication: Secondary | ICD-10-CM

## 2019-12-26 DIAGNOSIS — E785 Hyperlipidemia, unspecified: Secondary | ICD-10-CM | POA: Diagnosis not present

## 2019-12-26 DIAGNOSIS — R69 Illness, unspecified: Secondary | ICD-10-CM | POA: Diagnosis not present

## 2019-12-26 MED ORDER — DAPAGLIFLOZIN PROPANEDIOL 5 MG PO TABS: 10.0000 mg | ORAL_TABLET | Freq: Every day | ORAL | 3 refills | Status: DC

## 2019-12-26 MED ORDER — ASPIRIN EC 81 MG PO TBEC
81.0000 mg | DELAYED_RELEASE_TABLET | Freq: Every day | ORAL | 3 refills | Status: DC
Start: 2019-12-26 — End: 2021-02-09

## 2019-12-26 MED ORDER — AMLODIPINE BESYLATE 5 MG PO TABS: 10.0000 mg | ORAL_TABLET | Freq: Every day | ORAL | 3 refills | Status: DC

## 2019-12-26 NOTE — Progress Notes (Signed)
Cardiology Office Note:    Date:  12/26/2019   ID:  Jessica Hart, DOB November 15, 1940, MRN [id number]  PCP:  Girtha Rm, NP-C  Cardiologist:  Elouise Munroe, MD  Electrophysiologist:  None   Referring MD: Girtha Rm, NP-C   Chief Complaint/Reason for Referral: CAD s/p CABG  History of Present Illness:    Jessica Hart is a 79 y.o. female with a history of diabetes mellitus type 2, hypertension, hyperlipidemia, GERD, remote tobacco abuse who presents for follow-up after CABG x3 LIMA to LAD, reverse saphenous vein graft to PDA and OM1.  She was initially referred for coronary angiography given progressive chest pain and infarct seen on stress test inferior and inferolateral with nuclear stress EF of 36%.  Coronary angiography showed severe three-vessel coronary artery obstructive disease with severe three-vessel calcification.  Coronary artery bypass grafting performed on 09/02/2019.   An echo was performed on 08-29-19 demonstrating mild mitral valve regurgitation and estimated ejection fraction 40 to 45% with regional wall motion abnormalities.  Blood pressure has been elevated at cardiac rehab and she brings that log with her today.  Blood pressure mostly in the 696E systolic over 95M.  Occasional blood pressures in the 841L and 244W systolic.  We have been monitoring her blood pressure given hypotension in hospital and discontinuation of therapy as a result.  She is currently taking 5 mg of amlodipine.  With elevated blood pressures we discussed increasing this.  If still elevated afterward at cardiac rehab we discussed reinitiating her losartan given reduced ejection fraction.  We also reviewed increasing Farxiga for reduced ejection fraction.  She denies chest pain and is able to exert herself at cardiac rehab without symptoms.   Past Medical History:  Diagnosis Date  . Arthritis   . Cataract bilaterl 3 years ago  . Coronary artery disease   . Diabetes mellitus  02/27/2005  . Diabetes mellitus type 2, controlled, without complications ([place]) 03/01/7251   Qualifier: Diagnosis of  By: Drucie Ip    . Diverticulosis   . Dyspnea   . Echocardiogram findings abnormal, without diagnosis 2005   EF 47%, no ischemia, no infarction  . Former smoker 01/10/2017   45 pack year history, quit in 2007   . GERD (gastroesophageal reflux disease)   . Heart murmur   . History of bone density study 2006   Lt hip = -1.0, L spine = -1.8   . Hyperlipidemia   . Hypertension     Past Surgical History:  Procedure Laterality Date  . ABDOMINAL HYSTERECTOMY  1989   one ovary remains per patient  . CARDIAC CATHETERIZATION  08/20/2019  . COLONOSCOPY    . CORONARY ARTERY BYPASS GRAFT N/A 09/02/2019   Procedure: CORONARY ARTERY BYPASS GRAFTING (CABG) TIMES THREE USING LEFT INTERNAL MAMMARY ARTERY AND RIGHT GREATER SAPHENOUS VEIN;  Surgeon: Lajuana Matte, MD;  Location: [facility];  Service: Open Heart Surgery;  Laterality: N/A;  . ENDOVEIN HARVEST OF GREATER SAPHENOUS VEIN Right 09/02/2019   Procedure: ENDOVEIN HARVEST OF GREATER SAPHENOUS VEIN;  Surgeon: Lajuana Matte, MD;  Location: [facility];  Service: Open Heart Surgery;  Laterality: Right;  . EYE SURGERY Bilateral    CATARACT  . LEFT HEART CATH AND CORONARY ANGIOGRAPHY N/A 08/20/2019   Procedure: LEFT HEART CATH AND CORONARY ANGIOGRAPHY;  Surgeon: Belva Crome, MD;  Location: [facility];  Service: Cardiovascular;  Laterality: N/A;  . TEE WITHOUT CARDIOVERSION N/A 09/02/2019   Procedure: TRANSESOPHAGEAL ECHOCARDIOGRAM (  TEE);  Surgeon: Lajuana Matte, MD;  Location: [facility];  Service: Open Heart Surgery;  Laterality: N/A;  . TONSILECTOMY, ADENOIDECTOMY, BILATERAL MYRINGOTOMY AND TUBES  1965  . TONSILLECTOMY      Current Medications: Current Meds  Medication Sig  . acetaminophen (TYLENOL) 500 MG tablet Take 2 tablets (1,000 mg total) by mouth every 8 (eight) hours as needed.  Marland Kitchen amLODipine (NORVASC) 5 MG  tablet Take 1 tablet (5 mg total) by mouth daily.  Marland Kitchen aspirin EC 325 MG EC tablet Take 1 tablet (325 mg total) by mouth daily.  Marland Kitchen docusate sodium (COLACE) 50 MG capsule Take 1 capsule (50 mg total) by mouth daily as needed for mild constipation.  Marland Kitchen FARXIGA 5 MG TABS tablet TAKE 1 TABLET BY MOUTH EVERY DAY  . insulin degludec (TRESIBA FLEXTOUCH) 100 UNIT/ML SOPN FlexTouch Pen Inject 0.1 mLs (10 Units total) into the skin daily after supper.  . Insulin Pen Needle (PEN NEEDLES 5/16") 30G X 8 MM MISC Pen needles used for lantus injections  . lipase/protease/amylase (CREON) 36000 UNITS CPEP capsule Take 2 capsules with each meal three times daily  Take 1 capsule with each snack twice daily (Patient taking differently: Take 36,000-72,000 Units by mouth See admin instructions. Take 2 capsules with each meal three times daily  Take 1 capsule with each snack twice daily)  . lovastatin (MEVACOR) 20 MG tablet TAKE ONE TABLET BY MOUTH ONCE DAILY  . metFORMIN (GLUCOPHAGE) 1000 MG tablet TAKE 1 TABLET BY MOUTH TWICE DAILY WITH A MEAL  . Metoprolol Tartrate 37.5 MG TABS Take 37.5 mg by mouth 2 (two) times daily.  Marland Kitchen omeprazole (PRILOSEC) 40 MG capsule TAKE 1 CAPSULE BY MOUTH EVERY DAY  . OneTouch Delica Lancets 54Y MISC USE TO TEST BLOOD SUGAR ONCE A DAY  . ONETOUCH VERIO test strip USE 1 STRIP TO CHECK GLUCOSE ONCE DAILY  . polyethylene glycol (MIRALAX / GLYCOLAX) 17 g packet Take 17 g by mouth daily as needed for mild constipation.     Allergies:   Lisinopril   Social History   Tobacco Use  . Smoking status: Former Smoker    Packs/day: 1.00    Years: 45.00    Pack years: 45.00    Quit date: 07/27/2005    Years since quitting: 14.4  . Smokeless tobacco: Former Systems developer    Quit date: 03/2005  Vaping Use  . Vaping Use: Never used  Substance Use Topics  . Alcohol use: Yes    Comment: beer occasionally   . Drug use: No     Family History: The patient's family history includes Cancer in her sister; Heart  disease in her father; Tongue cancer in her sister. There is no history of Colon polyps, Rectal cancer, or Stomach cancer.  ROS:   Please see the history of present illness.    All other systems reviewed and are negative.  EKGs/Labs/Other Studies Reviewed:    The following studies were reviewed today:  EKG: NSR, inferior infarct, T wave abnl lateral  Recent Labs: 01/29/2019: TSH 0.923 08/29/2019: ALT 19 09/03/2019: Magnesium 2.0 09/05/2019: BUN 11; Creatinine, Ser 0.90; Hemoglobin 12.6; Platelets 233; Potassium 4.3; Sodium 136  Recent Lipid Panel    Component Value Date/Time   CHOL 153 01/29/2019 1057   TRIG 216 (H) 01/29/2019 1057   HDL 38 (L) 01/29/2019 1057   CHOLHDL 4.0 01/29/2019 1057   CHOLHDL 4.4 08/29/2016 0920   VLDL 28 08/29/2016 0920   LDLCALC 79 01/29/2019 1057   LDLDIRECT  81 03/21/2013 1431    Physical Exam:    VS:  BP 130/64   Pulse 77   Ht _0  (1.6 m)   Wt 166 lb (75.3 kg)   SpO2 94%   BMI 29.41 kg/m     Wt Readings from Last 5 Encounters:  12/09/19 165 lb 12.6 oz (75.2 kg)  10/17/19 161 lb 8 oz (73.3 kg)  10/09/19 162 lb 3.2 oz (73.6 kg)  09/23/19 160 lb 9.6 oz (72.8 kg)  09/12/19 160 lb (72.6 kg)    Constitutional: No acute distress Eyes: sclera non-icteric, normal conjunctiva and lids ENMT: normal dentition, moist mucous membranes Cardiovascular: regular rhythm, normal rate, no murmurs. S1 and S2 normal. Radial pulses normal bilaterally. No jugular venous distention.  Respiratory: clear to auscultation bilaterally GI : normal bowel sounds, soft and nontender. No distention.   MSK: extremities warm, well perfused. No edema.  NEURO: grossly nonfocal exam, moves all extremities. PSYCH: alert and oriented x 3, normal mood and affect.   ASSESSMENT:    1. Coronary artery disease involving native heart without angina pectoris, unspecified vessel or lesion type   2. S/P CABG x 3   3. Essential hypertension   4. Type 2 diabetes mellitus with  hyperglycemia, with long-term current use of insulin (HCC)   5. Hyperlipidemia associated with type 2 diabetes mellitus (HCC)    PLAN:    S/P CABG x 3 Coronary artery disease involving native heart without angina pectoris, unspecified vessel or lesion type Precordial pain - Doing well without chest pain or shortness of breath, symptoms have improved since revascularization. -Seen by surgery, felt to be stable. Decrease ASA to 81 mg daily, indefinitely.  -increase Farxiga to 10 mg daily.  -On lovastatin 20 mg daily, continue.  Will consider transition to high intensity statin versus PCSK9 inhibitor. -On metoprolol tartrate 37.5 mg twice daily, continue. Consider transition to Toprol XL -Would recommend repeat echocardiogram at next follow up.  Essential hypertension- - amlodipine and BB. Initial plan to increase amlodipine for better BP control however would prefer to resume her losartan 25 mg daily.    Hyperlipidemia associated with type 2 diabetes mellitus ([place]) - - needs referral to lipid clinic for PCKS9I discussion.    Controlled type 2 diabetes mellitus without complication, with long-term current use of insulin (HCC)-on Farxiga, will increase the dose to 10 mg daily for the benefit of her systolic function.    Total time of encounter: 30 minutes total time of encounter, including 20 minutes spent in face-to-face patient care on the date of this encounter. This time includes coordination of care and counseling regarding above mentioned problem list. Remainder of non-face-to-face time involved reviewing chart documents/testing relevant to the patient encounter and documentation in the medical record. I have independently reviewed documentation from referring provider.   Cherlynn Kaiser, MD   [facility]    Medication Adjustments/Labs and Tests Ordered: Current medicines are reviewed at length with the patient today.  Concerns regarding medicines are outlined above.    No orders of the defined types were placed in this encounter.   Meds ordered this encounter  Medications  . aspirin EC 81 MG tablet    Sig: Take 1 tablet (81 mg total) by mouth daily. Swallow whole.    Dispense:  90 tablet    Refill:  3  . dapagliflozin propanediol (FARXIGA) 5 MG TABS tablet    Sig: Take 2 tablets (10 mg total) by mouth daily.    Dispense:  90 tablet    Refill:  3  . amLODipine (NORVASC) 5 MG tablet    Sig: Take 2 tablets (10 mg total) by mouth daily.    Dispense:  60 tablet    Refill:  3    Patient Instructions  Medication Instructions:  PLEASE DECREASE ASPIRIN to  56m PLEASE INCREASE AMLODIPINE TO 130mDAILY  PLEASE INCREASE FARXIGA TO 1047mAILY  *If you need a refill on your cardiac medications before your next appointment, please call your pharmacy*  Lab Work: None Ordered At This Time.  If you have labs (blood work) drawn today and your tests are completely normal, you will receive your results only by: . MMarland KitchenChart Message (if you have MyChart) OR . A paper copy in the mail If you have any lab test that is abnormal or we need to change your treatment, we will call you to review the results.  Testing/Procedures: None Ordered At This Time.   Follow-Up: [facility] and your health needs are our priority.  As part of our continuing mission to provide you with exceptional heart care, we have created designated Provider Care Teams.  These Care Teams include your primary Cardiologist (physician) and Advanced Practice Providers (APPs -  Physician Assistants and Nurse Practitioners) who all work together to provide you with the care you need, when you need it.  We recommend signing up for the patient portal called "MyChart".  Sign up information is provided on this After Visit Summary.  MyChart is used to connect with patients for Virtual Visits (Telemedicine).  Patients are able to view lab/test results, encounter notes, upcoming appointments, etc.   Non-urgent messages can be sent to your provider as well.   To learn more about what you can do with MyChart, go to [email or web address]  Your next appointment:   2 week(s)  The format for your next appointment:   In Person  Provider:   GayCherlynn KaiserD

## 2019-12-26 NOTE — Patient Instructions (Signed)
Medication Instructions:  PLEASE DECREASE ASPIRIN to  26m PLEASE INCREASE AMLODIPINE TO 175mDAILY  PLEASE INCREASE FARXIGA TO 1015mAILY  *If you need a refill on your cardiac medications before your next appointment, please call your pharmacy*  Lab Work: None Ordered At This Time.  If you have labs (blood work) drawn today and your tests are completely normal, you will receive your results only by: . MMarland KitchenChart Message (if you have MyChart) OR . A paper copy in the mail If you have any lab test that is abnormal or we need to change your treatment, we will call you to review the results.  Testing/Procedures: None Ordered At This Time.   Follow-Up: [facility] and [facility] are our [facility].  As part of our continuing mission to provide you with exceptional heart care, we have created designated Provider Care Teams.  These Care Teams include your primary Cardiologist (physician) and Advanced Practice Providers (APPs -  Physician Assistants and Nurse Practitioners) who all work together to provide you with the care you need, when you need it.  We recommend signing up for the patient portal called "MyChart".  Sign up information is provided on this After Visit Summary.  MyChart is used to connect with patients for Virtual Visits (Telemedicine).  Patients are able to view lab/test results, encounter notes, upcoming appointments, etc.  Non-urgent messages can be sent to your provider as well.   To learn more about what you can do with MyChart, go to [email or web address]  Your next appointment:   2 week(s)  The format for your next appointment:   In Person  Provider:   GayCherlynn KaiserD

## 2019-12-29 ENCOUNTER — Other Ambulatory Visit (INDEPENDENT_AMBULATORY_CARE_PROVIDER_SITE_OTHER): Payer: Medicare HMO

## 2019-12-29 ENCOUNTER — Other Ambulatory Visit: Payer: Self-pay

## 2019-12-29 ENCOUNTER — Telehealth: Payer: Self-pay

## 2019-12-29 ENCOUNTER — Encounter (HOSPITAL_COMMUNITY)
Admission: RE | Admit: 2019-12-29 | Discharge: 2019-12-29 | Disposition: A | Discharge status: Home / Self Care | Payer: Medicare HMO | Source: Ambulatory Visit | Attending: Internal Medicine | Admitting: Internal Medicine

## 2019-12-29 DIAGNOSIS — Z79899 Other long term (current) drug therapy: Secondary | ICD-10-CM

## 2019-12-29 DIAGNOSIS — I10 Essential (primary) hypertension: Secondary | ICD-10-CM

## 2019-12-29 DIAGNOSIS — Z951 Presence of aortocoronary bypass graft: Secondary | ICD-10-CM | POA: Insufficient documentation

## 2019-12-29 MED ORDER — AMLODIPINE BESYLATE 5 MG PO TABS: 5.0000 mg | ORAL_TABLET | Freq: Every day | ORAL | 3 refills | Status: DC

## 2019-12-29 MED ORDER — LOSARTAN POTASSIUM 25 MG PO TABS
25.0000 mg | ORAL_TABLET | Freq: Every day | ORAL | 2 refills | Status: DC
Start: 2019-12-29 — End: 2020-03-25

## 2019-12-29 NOTE — Telephone Encounter (Signed)
Spoke with patient per message from Dr. Margaretann Loveless regarding patients current medications. Dr. Margaretann Loveless would like for patient to decrease Amlodipine to 1m and to restart Losartan 238mdaily. Dr. AcMargaretann Lovelessould also like for patient to return a few days prior to next appointment for BMET blood work. Advised patient of Dr. AcDelphina Cahillecommendations and medication changes. Advised patient that prescriptions have been sent in to the pharmacy and that order for BMET is placed in the chart. Advised patient to also bring all pill bottles to next appointment with Dr. AcMargaretann Lovelesss well. Patient verbalized understanding of all instructions and medication changes.

## 2019-12-31 ENCOUNTER — Other Ambulatory Visit: Payer: Self-pay

## 2019-12-31 ENCOUNTER — Encounter (HOSPITAL_COMMUNITY)
Admission: RE | Admit: 2019-12-31 | Discharge: 2019-12-31 | Disposition: A | Discharge status: Home / Self Care | Payer: Medicare HMO | Source: Ambulatory Visit | Attending: Internal Medicine | Admitting: Internal Medicine

## 2019-12-31 DIAGNOSIS — Z951 Presence of aortocoronary bypass graft: Secondary | ICD-10-CM | POA: Diagnosis not present

## 2020-01-02 ENCOUNTER — Other Ambulatory Visit: Payer: Self-pay

## 2020-01-02 ENCOUNTER — Encounter (HOSPITAL_COMMUNITY)
Admission: RE | Admit: 2020-01-02 | Discharge: 2020-01-02 | Disposition: A | Discharge status: Home / Self Care | Payer: Medicare HMO | Source: Ambulatory Visit | Attending: Internal Medicine | Admitting: Internal Medicine

## 2020-01-02 DIAGNOSIS — Z951 Presence of aortocoronary bypass graft: Secondary | ICD-10-CM | POA: Diagnosis not present

## 2020-01-02 NOTE — Progress Notes (Signed)
Nutrition Note  Pt wanted to learn basic cooking skills. Limited access to internet/no computer. Pt was able to use laptop in rehab to watch cooking skills video from [facility] website.  Pt learned ways to make healthy sandwiches, how to cook whole grains, and steam vegetables.  Provided time for questions and feedback for pt.  She verbalized understanding.  Nutrition Diagnosis   Food-and nutrition-related knowledge deficit related to lack of exposure to information as related to diagnosis of: ? CVD ? Type 2 Diabetes  Nutrition Intervention   Pt's individual nutrition plan reviewed with pt.  Benefits of adopting Heart Healthy diet discussed when Medficts reviewed.  Continue client-centered nutrition education by RD, as part of interdisciplinary care.  Goal(s)  Pt to build a healthy plate including vegetables, fruits, whole grains, and low-fat dairy products in a heart healthy meal plan.  Pt to learn basic cooking skills  Plan:  Will provide client-centered nutrition education as part of interdisciplinary care  Monitor and evaluate progress toward nutrition goal with team.   Michaele Offer, MS, RDN, LDN

## 2020-01-05 ENCOUNTER — Encounter (HOSPITAL_COMMUNITY)
Admission: RE | Admit: 2020-01-05 | Discharge: 2020-01-05 | Disposition: A | Discharge status: Home / Self Care | Payer: Medicare HMO | Source: Ambulatory Visit | Attending: Internal Medicine | Admitting: Internal Medicine

## 2020-01-05 ENCOUNTER — Other Ambulatory Visit: Payer: Self-pay

## 2020-01-05 DIAGNOSIS — Z951 Presence of aortocoronary bypass graft: Secondary | ICD-10-CM | POA: Diagnosis not present

## 2020-01-07 ENCOUNTER — Encounter (HOSPITAL_COMMUNITY)
Admission: RE | Admit: 2020-01-07 | Discharge: 2020-01-07 | Disposition: A | Discharge status: Home / Self Care | Payer: Medicare HMO | Source: Ambulatory Visit | Attending: Internal Medicine | Admitting: Internal Medicine

## 2020-01-07 ENCOUNTER — Other Ambulatory Visit: Payer: Self-pay

## 2020-01-07 DIAGNOSIS — Z79899 Other long term (current) drug therapy: Secondary | ICD-10-CM | POA: Diagnosis not present

## 2020-01-07 DIAGNOSIS — Z951 Presence of aortocoronary bypass graft: Secondary | ICD-10-CM | POA: Diagnosis not present

## 2020-01-07 LAB — BASIC METABOLIC PANEL
BUN/Creatinine Ratio: 11 — ABNORMAL LOW (ref 12–28)
BUN: 10 mg/dL (ref 8–27)
CO2: 21 mmol/L (ref 20–29)
Calcium: 9.4 mg/dL (ref 8.7–10.3)
Chloride: 106 mmol/L (ref 96–106)
Creatinine, Ser: 0.87 mg/dL (ref 0.57–1.00)
GFR calc Af Amer: 74 mL/min/{1.73_m2} (ref >59)
GFR calc non Af Amer: 64 mL/min/{1.73_m2} (ref >59)
Glucose: 105 mg/dL — ABNORMAL HIGH (ref 65–99)
Potassium: 4.7 mmol/L (ref 3.5–5.2)
Sodium: 140 mmol/L (ref 134–144)

## 2020-01-09 ENCOUNTER — Other Ambulatory Visit: Payer: Self-pay

## 2020-01-09 ENCOUNTER — Encounter (HOSPITAL_COMMUNITY)
Admission: RE | Admit: 2020-01-09 | Discharge: 2020-01-09 | Disposition: A | Discharge status: Home / Self Care | Payer: Medicare HMO | Source: Ambulatory Visit | Attending: Internal Medicine | Admitting: Internal Medicine

## 2020-01-09 DIAGNOSIS — Z951 Presence of aortocoronary bypass graft: Secondary | ICD-10-CM | POA: Diagnosis not present

## 2020-01-10 ENCOUNTER — Other Ambulatory Visit: Payer: Self-pay | Admitting: Family Medicine

## 2020-01-10 DIAGNOSIS — K219 Gastro-esophageal reflux disease without esophagitis: Secondary | ICD-10-CM

## 2020-01-12 ENCOUNTER — Telehealth: Payer: Self-pay | Admitting: Internal Medicine

## 2020-01-12 ENCOUNTER — Other Ambulatory Visit: Payer: Self-pay

## 2020-01-12 ENCOUNTER — Encounter: Payer: Self-pay | Admitting: Internal Medicine

## 2020-01-12 ENCOUNTER — Ambulatory Visit: Payer: Medicare HMO | Admitting: Internal Medicine

## 2020-01-12 ENCOUNTER — Encounter (HOSPITAL_COMMUNITY)
Admission: RE | Admit: 2020-01-12 | Discharge: 2020-01-12 | Disposition: A | Discharge status: Home / Self Care | Payer: Medicare HMO | Source: Ambulatory Visit | Attending: Internal Medicine | Admitting: Internal Medicine

## 2020-01-12 VITALS — BP 120/62 | HR 74 | Ht 63.0 in | Wt 166.2 lb

## 2020-01-12 DIAGNOSIS — I2510 Atherosclerotic heart disease of native coronary artery without angina pectoris: Secondary | ICD-10-CM

## 2020-01-12 DIAGNOSIS — E1169 Type 2 diabetes mellitus with other specified complication: Secondary | ICD-10-CM | POA: Diagnosis not present

## 2020-01-12 DIAGNOSIS — Z951 Presence of aortocoronary bypass graft: Secondary | ICD-10-CM

## 2020-01-12 DIAGNOSIS — E1165 Type 2 diabetes mellitus with hyperglycemia: Secondary | ICD-10-CM | POA: Diagnosis not present

## 2020-01-12 DIAGNOSIS — Z794 Long term (current) use of insulin: Secondary | ICD-10-CM | POA: Diagnosis not present

## 2020-01-12 DIAGNOSIS — E785 Hyperlipidemia, unspecified: Secondary | ICD-10-CM | POA: Diagnosis not present

## 2020-01-12 MED ORDER — DAPAGLIFLOZIN PROPANEDIOL 10 MG PO TABS: 10.0000 mg | ORAL_TABLET | Freq: Every day | ORAL | 3 refills | Status: DC

## 2020-01-12 NOTE — Progress Notes (Signed)
Cardiology Office Note:    Date:  01/12/2020   ID:  Jessica Hart, DOB 14-Feb-1941, MRN [id number]  PCP:  Girtha Rm, NP-C  Cardiologist:  Elouise Munroe, MD  Electrophysiologist:  None   Referring MD: Girtha Rm, NP-C   Chief Complaint/Reason for Referral: CAD s/p CABG, HTN  History of Present Illness:    Jessica Hart is a 79 y.o. female with a history of diabetes mellitus type 2, hypertension, hyperlipidemia, GERD, remote tobacco abuse who presents for follow-up after CABG x3 LIMA to LAD, reverse saphenous vein graft to PDA and OM1.  She was initially referred for coronary angiography given progressive chest pain and infarct seen on stress test inferior and inferolateral with nuclear stress EF of 36%.  Coronary angiography showed severe three-vessel coronary artery obstructive disease with severe three-vessel calcification.  Coronary artery bypass grafting performed on 09/02/2019.   An echo was performed on 08-29-19 demonstrating mild mitral valve regurgitation and estimated ejection fraction 40 to 45% with regional wall motion abnormalities.   We have been addressing mild hypertension while she attends cardiac rehab.  At our last visit we added losartan, laboratory studies are appropriate in regard to this.  Blood pressure has improved on 25 mg of losartan.  She is uncertain what dose of amlodipine she is taking, however I have informed her that whether it is 5 or 10 mg, her blood pressure seems well controlled and she can continue that dose, she will call us later today with her pill bottle in hand to let us know what dose she is actually taking.  We also increased her Wilder Glade to 10 mg daily.  We have provided samples and refills today.  She feels well overall and is participating well with cardiac rehab.  She sometimes feels she is told to slow down at rehab, we discussed that this is a good sign if she is able to have energy to do that.  Last echocardiogram in  July showed mildly reduced ejection fraction, we will need to repeat this now as it has been 4 months.  Past Medical History:  Diagnosis Date   Arthritis    Cataract bilaterl 3 years ago   Coronary artery disease    Diabetes mellitus 02/27/2005   Diabetes mellitus type 2, controlled, without complications ([place]) 1/91/6606   Qualifier: Diagnosis of  By: Drucie Ip     Diverticulosis    Dyspnea    Echocardiogram findings abnormal, without diagnosis 2005   EF 47%, no ischemia, no infarction   Former smoker 01/10/2017   45 pack year history, quit in 2007    GERD (gastroesophageal reflux disease)    Heart murmur    History of bone density study 2006   Lt hip = -1.0, L spine = -1.8    Hyperlipidemia    Hypertension     Past Surgical History:  Procedure Laterality Date   ABDOMINAL HYSTERECTOMY  1989   one ovary remains per patient   CARDIAC CATHETERIZATION  08/20/2019   COLONOSCOPY     CORONARY ARTERY BYPASS GRAFT N/A 09/02/2019   Procedure: CORONARY ARTERY BYPASS GRAFTING (CABG) TIMES THREE USING LEFT INTERNAL MAMMARY ARTERY AND RIGHT GREATER SAPHENOUS VEIN;  Surgeon: Lajuana Matte, MD;  Location: [place];  Service: Open Heart Surgery;  Laterality: N/A;   ENDOVEIN HARVEST OF GREATER SAPHENOUS VEIN Right 09/02/2019   Procedure: ENDOVEIN HARVEST OF GREATER SAPHENOUS VEIN;  Surgeon: Lajuana Matte, MD;  Location: [place];  Service: Open  Heart Surgery;  Laterality: Right;   EYE SURGERY Bilateral    CATARACT   LEFT HEART CATH AND CORONARY ANGIOGRAPHY N/A 08/20/2019   Procedure: LEFT HEART CATH AND CORONARY ANGIOGRAPHY;  Surgeon: Belva Crome, MD;  Location: [place] [facility];  Service: Cardiovascular;  Laterality: N/A;   TEE WITHOUT CARDIOVERSION N/A 09/02/2019   Procedure: TRANSESOPHAGEAL ECHOCARDIOGRAM (TEE);  Surgeon: Lajuana Matte, MD;  Location: [place];  Service: Open Heart Surgery;  Laterality: N/A;   TONSILECTOMY, ADENOIDECTOMY, BILATERAL  MYRINGOTOMY AND TUBES  1965   TONSILLECTOMY      Current Medications: Current Meds  Medication Sig   acetaminophen (TYLENOL) 500 MG tablet Take 2 tablets (1,000 mg total) by mouth every 8 (eight) hours as needed.   amLODipine (NORVASC) 5 MG tablet Take 1 tablet (5 mg total) by mouth daily.   aspirin EC 81 MG tablet Take 1 tablet (81 mg total) by mouth daily. Swallow whole.   dapagliflozin propanediol (FARXIGA) 10 MG TABS tablet Take 1 tablet (10 mg total) by mouth daily.   docusate sodium (COLACE) 50 MG capsule Take 1 capsule (50 mg total) by mouth daily as needed for mild constipation.   insulin degludec (TRESIBA FLEXTOUCH) 100 UNIT/ML SOPN FlexTouch Pen Inject 0.1 mLs (10 Units total) into the skin daily after supper.   Insulin Pen Needle (PEN NEEDLES 5/16") 30G X 8 MM MISC Pen needles used for lantus injections   lipase/protease/amylase (CREON) 36000 UNITS CPEP capsule Take 2 capsules with each meal three times daily  Take 1 capsule with each snack twice daily (Patient taking differently: Take 36,000-72,000 Units by mouth See admin instructions. Take 2 capsules with each meal three times daily  Take 1 capsule with each snack twice daily)   losartan (COZAAR) 25 MG tablet Take 1 tablet (25 mg total) by mouth daily.   lovastatin (MEVACOR) 20 MG tablet TAKE ONE TABLET BY MOUTH ONCE DAILY   metFORMIN (GLUCOPHAGE) 1000 MG tablet TAKE 1 TABLET BY MOUTH TWICE DAILY WITH A MEAL   Metoprolol Tartrate 37.5 MG TABS Take 37.5 mg by mouth 2 (two) times daily.   omeprazole (PRILOSEC) 40 MG capsule TAKE 1 CAPSULE BY MOUTH EVERY DAY   OneTouch Delica Lancets 62G MISC USE TO TEST BLOOD SUGAR ONCE A DAY   ONETOUCH VERIO test strip USE 1 STRIP TO CHECK GLUCOSE ONCE DAILY   polyethylene glycol (MIRALAX / GLYCOLAX) 17 g packet Take 17 g by mouth daily as needed for mild constipation.   tobramycin (TOBREX) 0.3 % ophthalmic solution Place 1 drop into the left eye every morning.     [DISCONTINUED] dapagliflozin propanediol (FARXIGA) 5 MG TABS tablet Take 2 tablets (10 mg total) by mouth daily.     Allergies:   Lisinopril   Social History   Tobacco Use   Smoking status: Former Smoker    Packs/day: 1.00    Years: 45.00    Pack years: 45.00    Quit date: 07/27/2005    Years since quitting: 14.4   Smokeless tobacco: Former Systems developer    Quit date: 03/2005  Vaping Use   Vaping Use: Never used  Substance Use Topics   Alcohol use: Yes    Comment: beer occasionally    Drug use: No     Family History: The patient's family history includes Cancer in her sister; Heart disease in her father; Tongue cancer in her sister. There is no history of Colon polyps, Rectal cancer, or Stomach cancer.  ROS:   Please  see the history of present illness.    All other systems reviewed and are negative.  EKGs/Labs/Other Studies Reviewed:    The following studies were reviewed today:  Recent Labs: 01/29/2019: TSH 0.923 08/29/2019: ALT 19 09/03/2019: Magnesium 2.0 09/05/2019: Hemoglobin 12.6; Platelets 233 01/07/2020: BUN 10; Creatinine, Ser 0.87; Potassium 4.7; Sodium 140 - reviewed. Stable on losartan. Recent Lipid Panel    Component Value Date/Time   CHOL 153 01/29/2019 1057   TRIG 216 (H) 01/29/2019 1057   HDL 38 (L) 01/29/2019 1057   CHOLHDL 4.0 01/29/2019 1057   CHOLHDL 4.4 08/29/2016 0920   VLDL 28 08/29/2016 0920   LDLCALC 79 01/29/2019 1057   LDLDIRECT 81 03/21/2013 1431    Physical Exam:    VS:  BP 120/62 (BP Location: Left Arm, Patient Position: Sitting, Cuff Size: Large)    Pulse 74    Ht _0  (1.6 m)    Wt 166 lb 3.2 oz (75.4 kg)    BMI 29.44 kg/m     Wt Readings from Last 5 Encounters:  01/12/20 166 lb 3.2 oz (75.4 kg)  12/26/19 166 lb (75.3 kg)  12/09/19 165 lb 12.6 oz (75.2 kg)  10/17/19 161 lb 8 oz (73.3 kg)  10/09/19 162 lb 3.2 oz (73.6 kg)    Constitutional: No acute distress Eyes: sclera non-icteric, normal conjunctiva and lids ENMT: normal  dentition, moist mucous membranes Cardiovascular: regular rhythm, normal rate, no murmurs. S1 and S2 normal. Radial pulses normal bilaterally. No jugular venous distention.  Respiratory: clear to auscultation bilaterally GI : normal bowel sounds, soft and nontender. No distention.   MSK: extremities warm, well perfused. No edema.  NEURO: grossly nonfocal exam, moves all extremities. PSYCH: alert and oriented x 3, normal mood and affect.   ASSESSMENT:    1. Coronary artery disease involving native heart without angina pectoris, unspecified vessel or lesion type   2. Type 2 diabetes mellitus with hyperglycemia, with long-term current use of insulin (HCC)   3. S/P CABG x 3   4. Hyperlipidemia associated with type 2 diabetes mellitus (HCC)    PLAN:    S/P CABG x 3 Coronary artery disease involving native heart without angina pectoris, unspecified vessel or lesion type Precordial pain - ASA to 81 mg daily, indefinitely.  -Farxiga 10 mg daily.  -On lovastatin 20 mg daily, continue.  Will consider transition to high intensity statin versus PCSK9 inhibitor.  Will refer to [facility] lipid clinic. -On metoprolol tartrate 37.5 mg twice daily, continue. Consider transition to Toprol XL if LVEF remains low.  She is tolerating current prescription well. -Would recommend repeat echocardiogram, will perform now.   Essential hypertension- - amlodipine and BB.  - losartan 25 mg daily.  -Blood pressure log from cardiac rehab reviewed and improved from before.   Hyperlipidemia associated with type 2 diabetes mellitus ([place]) - - needs referral to lipid clinic for PCKS9I discussion.    Controlled type 2 diabetes mellitus without complication, with long-term current use of insulin (HCC) - per PCP -on Farxiga 10 mg daily for reduced EF.    Total time of encounter: 30 minutes total time of encounter, including 20 minutes spent in face-to-face patient care on the date of this encounter. This time includes  coordination of care and counseling regarding above mentioned problem list. Remainder of non-face-to-face time involved reviewing chart documents/testing relevant to the patient encounter and documentation in the medical record. I have independently reviewed documentation from referring provider.   Cherlynn Kaiser, MD [facility]   [facility]    Medication Adjustments/Labs and Tests Ordered: Current medicines are reviewed at length with the patient today.  Concerns regarding medicines are outlined above.   Orders Placed This Encounter  Procedures   Lipid panel   ECHOCARDIOGRAM COMPLETE    Meds ordered this encounter  Medications   dapagliflozin propanediol (FARXIGA) 10 MG TABS tablet    Sig: Take 1 tablet (10 mg total) by mouth daily.    Dispense:  30 tablet    Refill:  3    Patient Instructions  Medication Instructions:  CALL us WITH YOUR MEDICATION LIST- [phone] *If you need a refill on your cardiac medications before your next appointment, please call your pharmacy*  Lab Work: FASTING LIPID BLOOD WORK- PLEASE HAVE THIS DONE PRIOR TO VISIT WITH PHARMACY If you have labs (blood work) drawn today and your tests are completely normal, you will receive your results only by:  [place] (if you have MyChart) OR  A paper copy in the mail If you have any lab test that is abnormal or we need to change your treatment, we will call you to review the results.  Testing/Procedures: Your physician has requested that you have an echocardiogram. Echocardiography is a painless test that uses sound waves to create images of your heart. It provides your doctor with information about the size and shape of your heart and how well your hearts chambers and valves are working. You may receive an ultrasound enhancing agent through an IV if needed to better visualize your heart during the echo.This procedure takes approximately one hour. There are no restrictions for this procedure.  This will take place at the [address], [address].   Follow-Up: At [facility], you and your health needs are our priority.  As part of our continuing mission to provide you with exceptional heart care, we have created designated Provider Care Teams.  These Care Teams include your primary Cardiologist (physician) and Advanced Practice Providers (APPs -  Physician Assistants and Nurse Practitioners) who all work together to provide you with the care you need, when you need it.  We recommend signing up for the patient portal called "MyChart".  Sign up information is provided on this After Visit Summary.  MyChart is used to connect with patients for Virtual Visits (Telemedicine).  Patients are able to view lab/test results, encounter notes, upcoming appointments, etc.  Non-urgent messages can be sent to your provider as well.   To learn more about what you can do with MyChart, go to [email or web address].    Your next appointment:   3 month(s)  The format for your next appointment:   In Person  Provider:   Cherlynn Kaiser, MD  Other Instructions YOU ARE BEING REFERRED TO [facility] (PHARMACIST) for LIPID CLINIC. PLEASE MAKE AN APPOINTMENT

## 2020-01-12 NOTE — Telephone Encounter (Signed)
Attempted to call patient, left message for patient to call back to office.   

## 2020-01-12 NOTE — Telephone Encounter (Signed)
Follow Up:   Pt said her last refill on Amlodipine 12-09-19 was for 10 mg.

## 2020-01-12 NOTE — Patient Instructions (Signed)
Medication Instructions:  CALL us WITH YOUR MEDICATION LIST- [phone] *If you need a refill on your cardiac medications before your next appointment, please call your pharmacy*  Lab Work: FASTING LIPID BLOOD WORK- PLEASE HAVE THIS DONE PRIOR TO VISIT WITH PHARMACY If you have labs (blood work) drawn today and your tests are completely normal, you will receive your results only by: Marland Kitchen MyChart Message (if you have MyChart) OR . A paper copy in the mail If you have any lab test that is abnormal or we need to change your treatment, we will call you to review the results.  Testing/Procedures: Your physician has requested that you have an echocardiogram. Echocardiography is a painless test that uses sound waves to create images of your heart. It provides your doctor with information about the size and shape of your heart and how well your heart's chambers and valves are working. You may receive an ultrasound enhancing agent through an IV if needed to better visualize your heart during the echo.This procedure takes approximately one hour. There are no restrictions for this procedure. This will take place at the [address].   Follow-Up: At [facility], you and your health needs are our priority.  As part of our continuing mission to provide you with exceptional heart care, we have created designated Provider Care Teams.  These Care Teams include your primary Cardiologist (physician) and Advanced Practice Providers (APPs -  Physician Assistants and Nurse Practitioners) who all work together to provide you with the care you need, when you need it.  We recommend signing up for the patient portal called "MyChart".  Sign up information is provided on this After Visit Summary.  MyChart is used to connect with patients for Virtual Visits (Telemedicine).  Patients are able to view lab/test results, encounter notes, upcoming appointments, etc.  Non-urgent messages can be sent to your provider  as well.   To learn more about what you can do with MyChart, go to [email or web address].    Your next appointment:   3 month(s)  The format for your next appointment:   In Person  Provider:   Cherlynn Kaiser, MD  Other Instructions YOU ARE BEING REFERRED TO [facility] (PHARMACIST) for LIPID CLINIC. PLEASE MAKE AN APPOINTMENT

## 2020-01-12 NOTE — Progress Notes (Signed)
Reviewed home exercise Rx with patient today. Pt is not currently exercising at home. Pt agrees to add 2 days of walking at home for 30 minutes. We discussed breaking the time up in to 15 minute blocks. Stressed the importance of warm-up, cool-down, and stretching. Reviewed THRR of 57-114 and staying between 11-13 on the RPE scale when exercising. Encouraged hydration before, during and after exercise. Reviewed weather parameters for temperature and humidity for exercise outdoors. Discussed S/S that would require patient to terminate exercise and when to call 911 vs MD. Reviewed and had patient practice palpating er pulse. Pt verbalized understanding of the exercise Rx and was provided a copy.   Lesly Rubenstein MS, ACSM-EP-C, CCRP

## 2020-01-13 MED ORDER — AMLODIPINE BESYLATE 10 MG PO TABS: 10.0000 mg | ORAL_TABLET | Freq: Every day | ORAL | 3 refills | Status: DC

## 2020-01-13 NOTE — Telephone Encounter (Signed)
Spoke with patient who states that she is taking Amlodipine 70m daily. Per Dr. AMargaretann Lovelesspatient okay to continue taking the 176mof amlodipine daily. Will re order amlodipine to updated dosage. Advised patient to continue taking the 1076mmlodipine once daily. Patient verbalized understanding.

## 2020-01-14 ENCOUNTER — Encounter (HOSPITAL_COMMUNITY)
Admission: RE | Admit: 2020-01-14 | Discharge: 2020-01-14 | Disposition: A | Discharge status: Home / Self Care | Payer: Medicare HMO | Source: Ambulatory Visit | Attending: Internal Medicine | Admitting: Internal Medicine

## 2020-01-14 ENCOUNTER — Other Ambulatory Visit: Payer: Self-pay

## 2020-01-14 DIAGNOSIS — Z951 Presence of aortocoronary bypass graft: Secondary | ICD-10-CM

## 2020-01-16 ENCOUNTER — Encounter (HOSPITAL_COMMUNITY)
Admission: RE | Admit: 2020-01-16 | Discharge: 2020-01-16 | Disposition: A | Discharge status: Home / Self Care | Payer: Medicare HMO | Source: Ambulatory Visit | Attending: Internal Medicine | Admitting: Internal Medicine

## 2020-01-16 ENCOUNTER — Other Ambulatory Visit: Payer: Self-pay

## 2020-01-16 DIAGNOSIS — Z951 Presence of aortocoronary bypass graft: Secondary | ICD-10-CM | POA: Diagnosis not present

## 2020-01-16 NOTE — Progress Notes (Signed)
Nutrition Note  Continued reviewing cooking skills with pt. Reviewed cooking greens, kale, and winter squash. She requests recipes for baking pork chops and chicken thighs. Provided recipes.   Will continue to monitor pt during cardiac rehab.  Michaele Offer, MS, RDN, LDN

## 2020-01-19 ENCOUNTER — Other Ambulatory Visit: Payer: Self-pay

## 2020-01-19 ENCOUNTER — Encounter (HOSPITAL_COMMUNITY)
Admission: RE | Admit: 2020-01-19 | Discharge: 2020-01-19 | Disposition: A | Discharge status: Home / Self Care | Payer: Medicare HMO | Source: Ambulatory Visit | Attending: Internal Medicine | Admitting: Internal Medicine

## 2020-01-19 DIAGNOSIS — Z951 Presence of aortocoronary bypass graft: Secondary | ICD-10-CM | POA: Diagnosis not present

## 2020-01-19 NOTE — Progress Notes (Signed)
Cardiac Individual Treatment Plan  Patient Details  Name: Jessica Hart MRN: [id number] Date of Birth: March 03, 1940 Referring Provider:     CARDIAC REHAB PHASE II ORIENTATION from 12/09/2019 in [facility]  Referring Provider Gayatri A. 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  acetaminophen (TYLENOL) 500 MG tablet, Take 2 tablets (1,000 mg total) by mouth every 8 (eight) hours as needed., Disp: 30 tablet, Rfl: 2 .  amLODipine (NORVASC) 10 MG tablet, Take 1 tablet (10 mg total) by mouth daily., Disp: 30 tablet, Rfl: 3 .  aspirin EC 81 MG tablet, Take 1 tablet (81 mg total) by mouth daily. Swallow whole., Disp: 90 tablet, Rfl: 3 .  dapagliflozin propanediol (FARXIGA) 10 MG TABS tablet, Take 1 tablet (10 mg total) by mouth daily., Disp: 30 tablet, Rfl: 3 .  docusate sodium (COLACE) 50 MG capsule, Take 1 capsule (50 mg total) by mouth daily as needed for mild constipation., Disp: 30 capsule, Rfl: 0 .  insulin degludec (TRESIBA FLEXTOUCH) 100 UNIT/ML SOPN FlexTouch Pen, Inject 0.1 mLs (10 Units total) into the skin daily after supper., Disp: 2 pen, Rfl: 0 .  Insulin Pen Needle (PEN NEEDLES 5/16") 30G X 8 MM MISC, Pen needles used for lantus injections, Disp: 100 each, Rfl: 3 .  lipase/protease/amylase (CREON) 36000 UNITS CPEP capsule, Take 2 capsules with each meal three times daily  Take 1 capsule with each snack twice daily (Patient taking differently: Take 36,000-72,000 Units by mouth See admin instructions. Take 2 capsules with each meal three times daily  Take 1 capsule with each snack twice daily), Disp: 240 capsule, Rfl: 11 .  losartan (COZAAR) 25 MG tablet, Take 1 tablet (25 mg total) by mouth daily., Disp: 30 tablet,  Rfl: 2 .  lovastatin (MEVACOR) 20 MG tablet, TAKE ONE TABLET BY MOUTH ONCE DAILY, Disp: 90 tablet, Rfl: 1 .  metFORMIN (GLUCOPHAGE) 1000 MG tablet, TAKE 1 TABLET BY MOUTH TWICE DAILY WITH A MEAL, Disp: 180 tablet, Rfl: 0 .  Metoprolol Tartrate 37.5 MG TABS, Take 37.5 mg by mouth 2 (two) times daily., Disp: 90 tablet, Rfl: 3 .  omeprazole (PRILOSEC) 40 MG capsule, TAKE 1 CAPSULE BY MOUTH EVERY DAY, Disp: 90 capsule, Rfl: 0 .  OneTouch Delica Lancets 93Y MISC, USE TO TEST BLOOD SUGAR ONCE A DAY, Disp: 100 each, Rfl: 1 .  ONETOUCH VERIO test strip, USE 1 STRIP TO CHECK GLUCOSE ONCE DAILY, Disp: 100 strip, Rfl: 1 .  polyethylene glycol (MIRALAX / GLYCOLAX) 17 g packet, Take 17 g by mouth daily as needed for mild constipation., Disp: 14 each, Rfl: 1 .  tobramycin (TOBREX) 0.3 % ophthalmic solution, Place 1 drop into the left eye every morning. , Disp: , Rfl:   Past Medical History: Past Medical History:  Diagnosis Date  . Arthritis   . Cataract bilaterl 3 years ago  . Coronary artery disease   . Diabetes mellitus 02/27/2005  . Diabetes mellitus type 2, controlled, without complications ([place]) 02/28/7508   Qualifier: Diagnosis of  By: Drucie Ip    . Diverticulosis   . Dyspnea   . Echocardiogram findings abnormal, without diagnosis 2005   EF 47%, no ischemia, no infarction  . Former smoker  01/10/2017   45 pack year history, quit in 2007   . GERD (gastroesophageal reflux disease)   . Heart murmur   . History of bone density study 2006   Lt hip = -1.0, L spine = -1.8   . Hyperlipidemia   . Hypertension     Tobacco Use: Social History   Tobacco Use  Smoking Status Former Smoker  . Packs/day: 1.00  . Years: 45.00  . Pack years: 45.00  . Quit date: 07/27/2005  . Years since quitting: 14.4  Smokeless Tobacco Former Systems developer  . Quit date: 03/2005    Labs: Recent Review Flowsheet Data    Labs for ITP Cardiac and Pulmonary Rehab Latest Ref Rng & Units 09/02/2019 09/02/2019 09/02/2019 09/03/2019  09/04/2019   Cholestrol 100 - 199 mg/dL - - - - -   LDLCALC 0 - 99 mg/dL - - - - -   LDLDIRECT mg/dL - - - - -   HDL >39 mg/dL - - - - -   Trlycerides 0 - 149 mg/dL - - - - -   Hemoglobin A1c 4.8 - 5.6 % - - - - -   PHART 7.35 - 7.45 7.326(L) 7.397 7.374 - -   PCO2ART 32 - 48 mmHg 42.3 32.0 34.9 - -   HCO3 20.0 - 28.0 mmol/L 22.6 19.8(L) 20.3 - -   TCO2 22 - 32 mmol/L 24 21(L) 21(L) - -   ACIDBASEDEF 0.0 - 2.0 mmol/L 4.0(H) 4.0(H) 4.0(H) - -   O2SAT % 90.0 93.0 93.0 53.9 69.8      Capillary Blood Glucose: Lab Results  Component Value Date   GLUCAP 139 (H) 12/17/2019   GLUCAP 207 (H) 12/17/2019   GLUCAP 155 (H) 12/15/2019   GLUCAP 198 (H) 12/15/2019   GLUCAP 118 (H) 09/07/2019     Exercise Target Goals: Exercise Program Goal: Individual exercise prescription set using results from initial 6 min walk test and THRR while considering  patient's activity barriers and safety.   Exercise Prescription Goal: Starting with aerobic activity 30 plus minutes a day, 3 days per week for initial exercise prescription. Provide home exercise prescription and guidelines that participant acknowledges understanding prior to discharge.  Activity Barriers & Risk Stratification:  Activity Barriers & Cardiac Risk Stratification - 12/09/19 1152      Activity Barriers & Cardiac Risk Stratification   Activity Barriers Arthritis    Cardiac Risk Stratification High           6 Minute Walk:  6 Minute Walk    Row Name 12/09/19 1050         6 Minute Walk   Phase Initial     Distance 1097 feet     Walk Time 6 minutes     # of Rest Breaks 1  Took 47 second rest break (3:56 - 4:43).     MPH 2.1     METS 2.1     RPE 13     Perceived Dyspnea  1     VO2 Peak 7.3     Symptoms Yes (comment)     Comments SOB, RPD = 1     Resting HR 84 bpm     Resting BP 124/72     Resting Oxygen Saturation  95 %     Exercise Oxygen Saturation  during 6 min walk 90 %  Pt's hands were cold. Warmed hands.     Max  Ex. HR 101 bpm     Max Ex. BP 140/70  2 Minute Post BP 128/70            Oxygen Initial Assessment:   Oxygen Re-Evaluation:   Oxygen Discharge (Final Oxygen Re-Evaluation):   Initial Exercise Prescription:  Initial Exercise Prescription - 12/09/19 1100      Date of Initial Exercise RX and Referring Provider   Date 12/09/19    Referring Provider Gayatri A. Margaretann Loveless, MD    Expected Discharge Date 02/06/20      NuStep   Level 2    SPM 75    Minutes 30    METs 2      Prescription Details   Frequency (times per week) 3    Duration Progress to 30 minutes of continuous aerobic without signs/symptoms of physical distress      Intensity   THRR 40-80% of Max Heartrate 57-114    Ratings of Perceived Exertion 11-13    Perceived Dyspnea 0-4      Progression   Progression Continue progressive overload as per policy without signs/symptoms or physical distress.      Resistance Training   Training Prescription Yes    Weight 2 lbs    Reps 10-15           Perform Capillary Blood Glucose checks as needed.  Exercise Prescription Changes:   Exercise Prescription Changes    Row Name 12/15/19 1000 12/24/19 1127 01/09/20 1045         Response to Exercise   Blood Pressure (Admit) 148/72 126/60 148/72     Blood Pressure (Exercise) 142/70 142/84 152/80     Blood Pressure (Exit) 122/60 120/78 138/66     Heart Rate (Admit) 84 bpm 97 bpm 98 bpm     Heart Rate (Exercise) 102 bpm 99 bpm 121 bpm     Heart Rate (Exit) 84 bpm 86 bpm 96 bpm     Rating of Perceived Exertion (Exercise) _0 Perceived Dyspnea (Exercise) 0 0 0     Symptoms None None None     Comments Pt's first day of exercise None Reviwed hone exercise Rx     Duration Progress to 10 minutes continuous walking  at current work load and total walking time to 30-45 min Progress to 30 minutes of  aerobic without signs/symptoms of physical distress Continue with 30 min of aerobic exercise without signs/symptoms  of physical distress.     Intensity THRR unchanged THRR unchanged THRR unchanged       Progression   Progression Continue to progress workloads to maintain intensity without signs/symptoms of physical distress. Continue to progress workloads to maintain intensity without signs/symptoms of physical distress. Continue to progress workloads to maintain intensity without signs/symptoms of physical distress.     Average METs 1.9 2.5 2.5       Resistance Training   Training Prescription Yes No No     Weight 2lbs -- 2 lbs     Reps 10-15 -- 10-15     Time 10 Minutes -- 10 Minutes       Interval Training   Interval Training No No No       NuStep   Level _1 SPM 75 90 95     Minutes _2 METs 1.9 2.6 2.8       Track   Laps _3 Minutes _4 METs 1.9 2.51 2.16  Home Exercise Plan   Plans to continue exercise at -- -- Home (comment)     Frequency -- -- Add 2 additional days to program exercise sessions.     Initial Home Exercises Provided -- -- 01/09/20            Exercise Comments:   Exercise Comments    Row Name 12/15/19 1102 01/09/20 1045         Exercise Comments Pt's first day of exercise. Pt reported knee discomfort with Nustep and walking the track. Pt able to exercise with multiple rest breaks due to fatigue. Will continue to work with pt to help increase strength and stamina. Reviewed home exercise Rx with patient. Pt agrees to add 30 minutes of walking 2x/week.             Exercise Goals and Review:   Exercise Goals    Row Name 12/09/19 1154             Exercise Goals   Increase Physical Activity Yes       Expected Outcomes Short Term: Attend rehab on a regular basis to increase amount of physical activity.;Long Term: Add in home exercise to make exercise part of routine and to increase amount of physical activity.;Long Term: Exercising regularly at least 3-5 days a week.       Increase Strength and Stamina Yes        Intervention Provide advice, education, support and counseling about physical activity/exercise needs.;Develop an individualized exercise prescription for aerobic and resistive training based on initial evaluation findings, risk stratification, comorbidities and participant's personal goals.       Expected Outcomes Short Term: Increase workloads from initial exercise prescription for resistance, speed, and METs.;Short Term: Perform resistance training exercises routinely during rehab and add in resistance training at home;Long Term: Improve cardiorespiratory fitness, muscular endurance and strength as measured by increased METs and functional capacity (6MWT)       Able to understand and use rate of perceived exertion (RPE) scale Yes       Intervention Provide education and explanation on how to use RPE scale       Expected Outcomes Short Term: Able to use RPE daily in rehab to express subjective intensity level;Long Term:  Able to use RPE to guide intensity level when exercising independently       Knowledge and understanding of Target Heart Rate Range (THRR) Yes       Intervention Provide education and explanation of THRR including how the numbers were predicted and where they are located for reference       Expected Outcomes Short Term: Able to state/look up THRR;Short Term: Able to use daily as guideline for intensity in rehab;Long Term: Able to use THRR to govern intensity when exercising independently       Understanding of Exercise Prescription Yes       Intervention Provide education, explanation, and written materials on patient's individual exercise prescription       Expected Outcomes Short Term: Able to explain program exercise prescription;Long Term: Able to explain home exercise prescription to exercise independently              Exercise Goals Re-Evaluation :  Exercise Goals Re-Evaluation    [place] Name 12/15/19 1104 01/09/20 1045           Exercise Goal Re-Evaluation   Exercise  Goals Review Increase Physical Activity;Increase Strength and Stamina;Able to understand and use rate of perceived exertion (RPE) scale Increase Physical Activity;Increase  Strength and Stamina;Able to understand and use rate of perceived exertion (RPE) scale;Knowledge and understanding of Target Heart Rate Range (THRR);Understanding of Exercise Prescription;Able to check pulse independently      Comments Pt's first day of exercise. Pt tolerated first day of exercise fairly okay. Pt requested to add walking track to prescription. Will continue to monitor knee discomfort. Pt oriented to exercise equipment. Reviewed home exercise prescription and reviewed with patient how to palpate her pulse. Pt agres to try to add 2x/week - 30 minutes of walking. Pt verbalized understanding of the honme exercise Rx and was provided a copy.      Expected Outcomes Pt will continue to increase strength and stamina. Pt will work towards walking for 15 minutes with minimal rest breaks. Pt will exercise at home by walking 2x/week for 30 minutes.              Discharge Exercise Prescription (Final Exercise Prescription Changes):  Exercise Prescription Changes - 01/09/20 1045      Response to Exercise   Blood Pressure (Admit) 148/72    Blood Pressure (Exercise) 152/80    Blood Pressure (Exit) 138/66    Heart Rate (Admit) 98 bpm    Heart Rate (Exercise) 121 bpm    Heart Rate (Exit) 96 bpm    Rating of Perceived Exertion (Exercise) 11    Perceived Dyspnea (Exercise) 0    Symptoms None    Comments Reviwed hone exercise Rx    Duration Continue with 30 min of aerobic exercise without signs/symptoms of physical distress.    Intensity THRR unchanged      Progression   Progression Continue to progress workloads to maintain intensity without signs/symptoms of physical distress.    Average METs 2.5      Resistance Training   Training Prescription No    Weight 2 lbs    Reps 10-15    Time 10 Minutes      Interval  Training   Interval Training No      NuStep   Level 2    SPM 95    Minutes 15    METs 2.8      Track   Laps 10    Minutes 15    METs 2.16      Home Exercise Plan   Plans to continue exercise at Home (comment)    Frequency Add 2 additional days to program exercise sessions.    Initial Home Exercises Provided 01/09/20           Nutrition:  Target Goals: Understanding of nutrition guidelines, daily intake of sodium <1558m, cholesterol <2056m calories 30% from fat and 7% or less from saturated fats, daily to have 5 or more servings of fruits and vegetables.  Biometrics:  Pre Biometrics - 12/09/19 1000      Pre Biometrics   Waist Circumference 39 inches    Hip Circumference 41 inches    Waist to Hip Ratio 0.95 %    Triceps Skinfold 32 mm    % Body Fat 42.7 %    Grip Strength 37 kg    Flexibility 15.5 in    Single Leg Stand 6.06 seconds   Moderate risk for fall           Nutrition Therapy Plan and Nutrition Goals:  Nutrition Therapy & Goals - 12/17/19 1027      Nutrition Therapy   Diet heart Healthy/carb mod    Drug/Food Interactions Statins/Certain Fruits      Personal Nutrition  Goals   Nutrition Goal Pt to build a healthy plate including vegetables, fruits, whole grains, and low-fat dairy products in a heart healthy meal plan.    Personal Goal #2 Pt to learn basic cooking skills      Intervention Plan   Intervention Prescribe, educate and counsel regarding individualized specific dietary modifications aiming towards targeted core components such as weight, hypertension, lipid management, diabetes, heart failure and other comorbidities.;Nutrition handout(s) given to patient.    Expected Outcomes Short Term Goal: Understand basic principles of dietary content, such as calories, fat, sodium, cholesterol and nutrients.           Nutrition Assessments:  MEDIFICTS Score Key:  ?70 Need to make dietary changes   40-70 Heart Healthy Diet  ? 40 Therapeutic  Level Cholesterol Diet   Picture Your Plate Scores:  <81 Unhealthy dietary pattern with much room for improvement.  41-50 Dietary pattern unlikely to meet recommendations for good health and room for improvement.  51-60 More healthful dietary pattern, with some room for improvement.   >60 Healthy dietary pattern, although there may be some specific behaviors that could be improved.    Nutrition Goals Re-Evaluation:  Nutrition Goals Re-Evaluation    [place] Name 12/17/19 1032 01/15/20 1432           Goals   Current Weight 165 lb (74.8 kg) 166 lb (75.3 kg)      Nutrition Goal Pt to build a healthy plate including vegetables, fruits, whole grains, and low-fat dairy products in a heart healthy meal plan. Pt to build a healthy plate including vegetables, fruits, whole grains, and low-fat dairy products in a heart healthy meal plan.      Comment -- Pt watched American Heart Association videos while exercising at rehab. Pt plans to increase whole grain and use steamer to cook veggies.        Personal Goal #2 Re-Evaluation   Personal Goal #2 Pt to learn basic cooking skills Pt to learn basic cooking skills             Nutrition Goals Discharge (Final Nutrition Goals Re-Evaluation):  Nutrition Goals Re-Evaluation - 01/15/20 1432      Goals   Current Weight 166 lb (75.3 kg)    Nutrition Goal Pt to build a healthy plate including vegetables, fruits, whole grains, and low-fat dairy products in a heart healthy meal plan.    Comment Pt watched American Heart Association videos while exercising at rehab. Pt plans to increase whole grain and use steamer to cook veggies.      Personal Goal #2 Re-Evaluation   Personal Goal #2 Pt to learn basic cooking skills           Psychosocial: Target Goals: Acknowledge presence or absence of significant depression and/or stress, maximize coping skills, provide positive support system. Participant is able to verbalize types and ability to use  techniques and skills needed for reducing stress and depression.  Initial Review & Psychosocial Screening:  Initial Psych Review & Screening - 12/24/19 1630      Family Dynamics   Concerns No support system           Quality of Life Scores:  Quality of Life - 12/09/19 1144      Quality of Life   Select Quality of Life      Quality of Life Scores   Health/Function Pre 26.03 %    Socioeconomic Pre 23.17 %    Psych/Spiritual Pre 29.14 %  Family Pre 27.6 %    GLOBAL Pre 26.41 %          Scores of 19 and below usually indicate a poorer quality of life in these areas.  A difference of  2-3 points is a clinically meaningful difference.  A difference of 2-3 points in the total score of the Quality of Life Index has been associated with significant improvement in overall quality of life, self-image, physical symptoms, and general health in studies assessing change in quality of life.  PHQ-9: Recent Review Flowsheet Data    Depression screen [facility] 2/9 12/09/2019 01/29/2019 01/14/2018 01/10/2017 12/29/2015   Decreased Interest 0 0 0 0 0   Down, Depressed, Hopeless 0 0 0 0 0   PHQ - 2 Score 0 0 0 0 0     Interpretation of Total Score  Total Score Depression Severity:  1-4 = Minimal depression, 5-9 = Mild depression, 10-14 = Moderate depression, 15-19 = Moderately severe depression, 20-27 = Severe depression   Psychosocial Evaluation and Intervention:   Psychosocial Re-Evaluation:  Psychosocial Re-Evaluation    [place] Name 12/24/19 1630 01/16/20 1323           Psychosocial Re-Evaluation   Current issues with None Identified None Identified      Interventions Encouraged to attend Cardiac Rehabilitation for the exercise Encouraged to attend Cardiac Rehabilitation for the exercise      Continue Psychosocial Services  No Follow up required No Follow up required             Psychosocial Discharge (Final Psychosocial Re-Evaluation):  Psychosocial Re-Evaluation - 01/16/20 1323       Psychosocial Re-Evaluation   Current issues with None Identified    Interventions Encouraged to attend Cardiac Rehabilitation for the exercise    Continue Psychosocial Services  No Follow up required           Vocational Rehabilitation: Provide vocational rehab assistance to qualifying candidates.   Vocational Rehab Evaluation & Intervention:  Vocational Rehab - 12/09/19 1039      Initial Vocational Rehab Evaluation & Intervention   Assessment shows need for Vocational Rehabilitation No   [name] is retired and does not need vocational rehab at this time          Education: Education Goals: Education classes will be provided on a weekly basis, covering required topics. Participant will state understanding/return demonstration of topics presented.  Learning Barriers/Preferences:  Learning Barriers/Preferences - 12/09/19 1145      Learning Barriers/Preferences   Learning Barriers Sight   waers glasses   Learning Preferences Written Material;Computer/Internet           Education Topics: Hypertension, Hypertension Reduction -Define heart disease and high blood pressure. Discus how high blood pressure affects the body and ways to reduce high blood pressure.   Exercise and Your Heart -Discuss why it is important to exercise, the FITT principles of exercise, normal and abnormal responses to exercise, and how to exercise safely.   Angina -Discuss definition of angina, causes of angina, treatment of angina, and how to decrease risk of having angina.   Cardiac Medications -Review what the following cardiac medications are used for, how they affect the body, and side effects that may occur when taking the medications.  Medications include Aspirin, Beta blockers, calcium channel blockers, ACE Inhibitors, angiotensin receptor blockers, diuretics, digoxin, and antihyperlipidemics.   Congestive Heart Failure -Discuss the definition of CHF, how to live with CHF, the signs and  symptoms of CHF, and how  keep track of weight and sodium intake.   Heart Disease and Intimacy -Discus the effect sexual activity has on the heart, how changes occur during intimacy as we age, and safety during sexual activity.   Smoking Cessation / COPD -Discuss different methods to quit smoking, the health benefits of quitting smoking, and the definition of COPD.   Nutrition I: Fats -Discuss the types of cholesterol, what cholesterol does to the heart, and how cholesterol levels can be controlled.   Nutrition II: Labels -Discuss the different components of food labels and how to read food label   Heart Parts/Heart Disease and PAD -Discuss the anatomy of the heart, the pathway of blood circulation through the heart, and these are affected by heart disease.   Stress I: Signs and Symptoms -Discuss the causes of stress, how stress may lead to anxiety and depression, and ways to limit stress.   Stress II: Relaxation -Discuss different types of relaxation techniques to limit stress.   Warning Signs of Stroke / TIA -Discuss definition of a stroke, what the signs and symptoms are of a stroke, and how to identify when someone is having stroke.   Knowledge Questionnaire Score:  Knowledge Questionnaire Score - 12/09/19 1145      Knowledge Questionnaire Score   Pre Score 15/24           Core Components/Risk Factors/Patient Goals at Admission:  Personal Goals and Risk Factors at Admission - 12/09/19 1147      Core Components/Risk Factors/Patient Goals on Admission    Weight Management Yes;Weight Loss    Intervention Weight Management: Develop a combined nutrition and exercise program designed to reach desired caloric intake, while maintaining appropriate intake of nutrient and fiber, sodium and fats, and appropriate energy expenditure required for the weight goal.;Weight Management: Provide education and appropriate resources to help participant work on and attain dietary  goals.;Weight Management/Obesity: Establish reasonable short term and long term weight goals.    Admit Weight 165 lb 12.6 oz (75.2 kg)    Expected Outcomes Short Term: Continue to assess and modify interventions until short term weight is achieved;Long Term: Adherence to nutrition and physical activity/exercise program aimed toward attainment of established weight goal;Weight Maintenance: Understanding of the daily nutrition guidelines, which includes 25-35% calories from fat, 7% or less cal from saturated fats, less than 252m cholesterol, less than 1.5gm of sodium, & 5 or more servings of fruits and vegetables daily;Weight Loss: Understanding of general recommendations for a balanced deficit meal plan, which promotes 1-2 lb weight loss per week and includes a negative energy balance of [phone] kcal/d;Understanding recommendations for meals to include 15-35% energy as protein, 25-35% energy from fat, 35-60% energy from carbohydrates, less than 2065mof dietary cholesterol, 20-35 gm of total fiber daily;Understanding of distribution of calorie intake throughout the day with the consumption of 4-5 meals/snacks    Diabetes Yes    Intervention Provide education about signs/symptoms and action to take for hypo/hyperglycemia.;Provide education about proper nutrition, including hydration, and aerobic/resistive exercise prescription along with prescribed medications to achieve blood glucose in normal ranges: Fasting glucose 65-99 mg/dL    Expected Outcomes Short Term: Participant verbalizes understanding of the signs/symptoms and immediate care of hyper/hypoglycemia, proper foot care and importance of medication, aerobic/resistive exercise and nutrition plan for blood glucose control.;Long Term: Attainment of HbA1C < 7%.    Hypertension Yes    Intervention Provide education on lifestyle modifcations including regular physical activity/exercise, weight management, moderate sodium restriction and increased  consumption of fresh  fruit, vegetables, and low fat dairy, alcohol moderation, and smoking cessation.;Monitor prescription use compliance.    Expected Outcomes Short Term: Continued assessment and intervention until BP is < 140/34m HG in hypertensive participants. < 130/854mHG in hypertensive participants with diabetes, heart failure or chronic kidney disease.;Long Term: Maintenance of blood pressure at goal levels.    Lipids Yes    Intervention Provide education and support for participant on nutrition & aerobic/resistive exercise along with prescribed medications to achieve LDL <7028mHDL >27m30m  Expected Outcomes Short Term: Participant states understanding of desired cholesterol values and is compliant with medications prescribed. Participant is following exercise prescription and nutrition guidelines.;Long Term: Cholesterol controlled with medications as prescribed, with individualized exercise RX and with personalized nutrition plan. Value goals: LDL < 70mg27mL > 40 mg.           Core Components/Risk Factors/Patient Goals Review:   Goals and Risk Factor Review    Row Name 12/09/19 1042 12/24/19 1631 01/16/20 1324         Core Components/Risk Factors/Patient Goals Review   Personal Goals Review -- Weight Management/Obesity;Lipids;Hypertension;Diabetes Weight Management/Obesity;Lipids;Hypertension;Diabetes     Review -- Gwen Meredith Modyhad some moderate resting and exertional BP elevation. CBG's have been stable Gwen has been doing well with exercise. Gwen's Blood pressure readings have improved. CBG's have been stable.     Expected Outcomes -- Gwen [name] continue to partcipate in phase 2 cardiac rehab for exercise, nutrtion and lifestyle modifications. Will continue to monitor BP Gwen will continue to partcipate in phase 2 cardiac rehab for exercise, nutrtion and lifestyle modifications. Will continue to monitor BP            Core Components/Risk Factors/Patient Goals at Discharge (Final  Review):   Goals and Risk Factor Review - 01/16/20 1324      Core Components/Risk Factors/Patient Goals Review   Personal Goals Review Weight Management/Obesity;Lipids;Hypertension;Diabetes    Review Gwen Meredith Modybeen doing well with exercise. Gwen's Blood pressure readings have improved. CBG's have been stable.    Expected Outcomes Gwen will continue to partcipate in phase 2 cardiac rehab for exercise, nutrtion and lifestyle modifications. Will continue to monitor BP           ITP Comments:  ITP Comments    Row Name 12/09/19 1038 12/24/19 1629 01/16/20 1321       ITP Comments Dr TraciFransico HimMedical Director 30 Day ITP Review. gwen ahs good attendance and partcipation in phase 2 cardiac rehab 30 Day ITP Review. gwen has good attendance and partcipation in phase 2 cardiac rehab. Gwen continues to do well with exercise.            Comments: See ITP comments.MariaBarnet PallBSN 01/20/2020 10:14 AM

## 2020-01-21 ENCOUNTER — Other Ambulatory Visit: Payer: Self-pay

## 2020-01-21 ENCOUNTER — Encounter (HOSPITAL_COMMUNITY)
Admission: RE | Admit: 2020-01-21 | Discharge: 2020-01-21 | Disposition: A | Discharge status: Home / Self Care | Payer: Medicare HMO | Source: Ambulatory Visit | Attending: Internal Medicine | Admitting: Internal Medicine

## 2020-01-21 DIAGNOSIS — Z951 Presence of aortocoronary bypass graft: Secondary | ICD-10-CM

## 2020-01-23 ENCOUNTER — Encounter (HOSPITAL_COMMUNITY): Payer: Medicare HMO

## 2020-01-26 ENCOUNTER — Other Ambulatory Visit: Payer: Self-pay

## 2020-01-26 ENCOUNTER — Encounter (HOSPITAL_COMMUNITY)
Admission: RE | Admit: 2020-01-26 | Discharge: 2020-01-26 | Disposition: A | Discharge status: Home / Self Care | Payer: Medicare HMO | Source: Ambulatory Visit | Attending: Internal Medicine | Admitting: Internal Medicine

## 2020-01-26 DIAGNOSIS — Z951 Presence of aortocoronary bypass graft: Secondary | ICD-10-CM

## 2020-01-28 ENCOUNTER — Encounter (HOSPITAL_COMMUNITY)
Admission: RE | Admit: 2020-01-28 | Discharge: 2020-01-28 | Disposition: A | Discharge status: Home / Self Care | Payer: Medicare HMO | Source: Ambulatory Visit | Attending: Internal Medicine | Admitting: Internal Medicine

## 2020-01-28 ENCOUNTER — Other Ambulatory Visit: Payer: Self-pay

## 2020-01-28 DIAGNOSIS — Z951 Presence of aortocoronary bypass graft: Secondary | ICD-10-CM | POA: Diagnosis not present

## 2020-01-30 ENCOUNTER — Other Ambulatory Visit: Payer: Self-pay

## 2020-01-30 ENCOUNTER — Encounter (HOSPITAL_COMMUNITY)
Admission: RE | Admit: 2020-01-30 | Discharge: 2020-01-30 | Disposition: A | Discharge status: Home / Self Care | Payer: Medicare HMO | Source: Ambulatory Visit | Attending: Internal Medicine | Admitting: Internal Medicine

## 2020-01-30 ENCOUNTER — Ambulatory Visit: Payer: Medicare HMO | Attending: Internal Medicine

## 2020-01-30 DIAGNOSIS — Z23 Encounter for immunization: Secondary | ICD-10-CM

## 2020-01-30 DIAGNOSIS — Z951 Presence of aortocoronary bypass graft: Secondary | ICD-10-CM

## 2020-01-30 NOTE — Progress Notes (Signed)
   Covid-19 Vaccination Clinic  Name:  Jessica Hart    MRN: [id number] DOB: March 08, 1940  01/30/2020  [name] was observed post Covid-19 immunization for 15 minutes without incident. She was provided with Vaccine Information Sheet and instruction to access the V-Safe system.   [name] was instructed to call 911 with any severe reactions post vaccine: Marland Kitchen Difficulty breathing  . Swelling of face and throat  . A fast heartbeat  . A bad rash all over body  . Dizziness and weakness

## 2020-02-01 DIAGNOSIS — E1169 Type 2 diabetes mellitus with other specified complication: Secondary | ICD-10-CM | POA: Insufficient documentation

## 2020-02-01 NOTE — Progress Notes (Signed)
Jessica Hart is a 79 y.o. female who presents for annual wellness visit, CPE and follow-up on chronic medical conditions.  She has the following concerns:  Reports having a rash on her vulva which is not new. Using cream Dr. Dellis Filbert prescribed. Per records, she was diagnosed with lichen sclerosis. She also had biopsy of vulvar lesion. She will call and follow-up with Dr. Dellis Filbert  Diabetes- this morning her BS was 100. Reports taking her medication daily and denies any concerns  Reports taking her statin daily  She is under the care of her gastroenterologist. She is curious whether she would need to take Creon indefinitely or not. Reports that she vomits once monthly at least one time and then feels back to normal.  Loose stools - chronic   Left ear feels clogged. She would like to have it cleaned out if needed. Ports history of trauma to her left ear    Immunization History  Administered Date(s) Administered  . Fluad Quad(high Dose 65+) 11/11/2018  . Influenza Whole 12/11/2007  . Influenza, High Dose Seasonal PF 02/29/2016, 01/10/2017, 11/15/2017  . Influenza,inj,Quad PF,6+ Mos 10/29/2012, 04/06/2014, 02/19/2015  . PFIZER SARS-COV-2 Vaccination 04/22/2019, 05/13/2019, 01/30/2020  . Pneumococcal Conjugate-13 04/06/2014  . Pneumococcal Polysaccharide-23 12/11/2007  . Td 05/28/2004   Last Pap smear:  02/14/2019 and vulvar biopsy  Last mammogram: January 2021 Last colonoscopy: 12/2017 Last DEXA:  02/2016 normal  Dentist: none. Plans to schedule  Ophtho: Dr. Gershon Crane eye exam 04/29/2019 Exercise: cardiac rehab Eating healthier   Other doctors caring for patient include: Dr. Dellis Filbert- gyn Dr. Margaretann Loveless- cardiology Dr. Silverio Decamp- GI   Married. Retired. 1 son and 4 grandkids. 2 great-grand kids.   Depression screen:  See questionnaire below.  Depression screen [facility] 2/9 02/02/2020 12/09/2019 01/29/2019 01/14/2018 01/10/2017  Decreased Interest 0 0 0 0 0  Down, Depressed, Hopeless 0 0  0 0 0  PHQ - 2 Score 0 0 0 0 0  Some recent data might be hidden    Fall Risk Screen: see questionnaire below. Fall Risk  02/02/2020 12/09/2019 01/29/2019 01/14/2018 01/10/2017  Falls in the past year? 0 0 0 0 No  Number falls in past yr: 0 0 0 0 -  Injury with Fall? 0 0 0 0 -  Risk for fall due to : - Impaired balance/gait - - -  Follow up - Falls evaluation completed - - -    ADL screen:  See questionnaire below Functional Status Survey: Is the patient deaf or have difficulty hearing?: No Does the patient have difficulty seeing, even when wearing glasses/contacts?: No Does the patient have difficulty concentrating, remembering, or making decisions?: No Does the patient have difficulty walking or climbing stairs?: No Does the patient have difficulty dressing or bathing?: No Does the patient have difficulty doing errands alone such as visiting a doctor's office or shopping?: No   End of Life Discussion:  Patient does not have a living will and medical power of attorney. I provided her with the forms once again and discussed the importance of designating a [facility] POA and doing her living will. MOST form reviewed and discussed.   Review of Systems Constitutional: -fever, -chills, -sweats, -unexpected weight change, -anorexia, -fatigue Allergy: -sneezing, -itching, -congestion Dermatology: denies changing moles, rash, lumps, new worrisome lesions ENT: -runny nose, -ear pain, -sore throat, -hoarseness, -sinus pain, -teeth pain, -tinnitus, -hearing loss, -epistaxis Cardiology:  -chest pain, -palpitations, -edema, -orthopnea, -paroxysmal nocturnal dyspnea Respiratory: -cough, -shortness of breath, -dyspnea on exertion, -wheezing, -hemoptysis  Gastroenterology: -abdominal pain, -nausea, -vomiting, -diarrhea, -constipation, -blood in stool, -changes in bowel movement, -dysphagia Hematology: -bleeding or bruising problems Musculoskeletal: -arthralgias, -myalgias, -joint swelling, -back pain, -neck  pain, -cramping, -gait changes Ophthalmology: -vision changes, -eye redness, -itching, -discharge Urology: -dysuria, -difficulty urinating, -hematuria, -urinary frequency, -urgency, -incontinence Neurology: -headache, -weakness, -tingling, -numbness, -speech abnormality, -memory loss, -falls, -dizziness  Psychology:  -depressed mood, -agitation, -sleep problems    PHYSICAL EXAM:  BP 130/70   Pulse 81   Ht _0  (1.6 m)   Wt 162 lb (73.5 kg)   BMI 28.70 kg/m   General Appearance: Alert, cooperative, no distress, appears stated age Head: Normocephalic, without obvious abnormality, atraumatic Eyes: PERRL, conjunctiva/corneas clear, EOM's intact Ears: Normal TM's and external ear canals Nose: Mask on Throat: Mask on Neck: Supple, no lymphadenopathy; thyroid: no enlargement/tenderness/nodules Back: Spine nontender, no curvature, ROM normal, no CVA tenderness Lungs: Clear to auscultation bilaterally without wheezes, rales or ronchi; respirations unlabored Chest Wall: No tenderness or deformity Heart: Regular rate and rhythm, S1 and S2 normal, no murmur, rub or gallop Breast Exam: OB/GYN Abdomen: Soft, non-tender, nondistended, normoactive bowel sounds, no masses, no hepatosplenomegaly Genitalia: OB/GYN Extremities: No clubbing, cyanosis or edema Pulses: 2+ and symmetric all extremities Skin: Skin color, texture, turgor normal Lymph nodes: Cervical, supraclavicular, and axillary nodes normal Neurologic: CNII-XII intact, normal strength, sensation and gait; reflexes 2+ and symmetric throughout Psych: Normal mood, affect, hygiene and grooming.  ASSESSMENT/PLAN: Medicare annual wellness visit, subsequent -She denies any issues with memory, ADLs, depression or falls. Medications reviewed. Her list of providers was updated. She is in good spirits. Advanced directive counseling was done.  Routine general medical examination at a health care facility - Plan: CBC with  Differential/Platelet, Comprehensive metabolic panel -Preventive health care reviewed. She has an OB/GYN. Colonoscopy up-to-date. Followed by GI. Counseling on healthy lifestyle including diet and exercise. Immunizations reviewed. Discussed safety and health promotion.  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 Continue current medications. Continue limiting sugar and carbohydrates and encouraged her to continue with exercise even after cardiac rehab is complete which is soon per patient.  Hyperlipidemia associated with type 2 diabetes mellitus ([place]) - Plan: Lipid panel -Continue statin therapy  Gastroesophageal reflux disease, unspecified whether esophagitis present -Follow-up with GI  HYPERTENSION, BENIGN SYSTEMIC -Well-controlled. Followed by cardiology.  Coronary artery calcification seen on CAT scan -Followed by cardiology. Continue statin therapy.  Need for hepatitis C screening test - Plan: Hepatitis C antibody -Done per screening guidelines  Lichen sclerosus of vulva -Follow-up with her OB/GYN  Advance directive discussed with patient  Bilateral impacted cerumen - Plan: Ambulatory referral to ENT -Ear lavage performed by Sabrina, CMA, however we were unable to remove all of the cerumen in her ear canal was quite irritated during the procedure. I will refer her to ENT for further evaluation and treatment  Irritation of left ear - Plan: Ambulatory referral to ENT -See above note    Discussed monthly self breast exams and yearly mammograms; at least 30 minutes of aerobic activity at least 5 days/week and weight-bearing exercise 2x/week; proper sunscreen use reviewed; healthy diet, including goals of calcium and vitamin D intake and alcohol recommendations (less than or equal to 1 drink/day) reviewed;  regular seatbelt use; changing batteries in smoke detectors.  Immunization recommendations discussed.  Colonoscopy recommendations reviewed   Medicare Attestation  I have personally reviewed: The patient's medical and social history Their use of alcohol, tobacco or illicit drugs Their current medications and supplements The patient's functional ability including ADLs,fall risks, home safety risks, cognitive, and hearing and visual impairment Diet and physical activities Evidence for depression or mood disorders  The patient's weight, height, and BMI have been recorded in the chart.  I have made referrals, counseling, and provided education to the patient based on review of the above and I have provided the patient with a written personalized care plan for preventive services.     Harland Dingwall, NP-C   02/03/2020

## 2020-02-02 ENCOUNTER — Other Ambulatory Visit: Payer: Self-pay

## 2020-02-02 ENCOUNTER — Encounter: Payer: Self-pay | Admitting: Family Medicine

## 2020-02-02 ENCOUNTER — Encounter (HOSPITAL_COMMUNITY): Payer: Medicare HMO

## 2020-02-02 ENCOUNTER — Ambulatory Visit (INDEPENDENT_AMBULATORY_CARE_PROVIDER_SITE_OTHER): Payer: Medicare HMO | Admitting: Family Medicine

## 2020-02-02 VITALS — BP 130/70 | HR 81 | Ht 63.0 in | Wt 162.0 lb

## 2020-02-02 DIAGNOSIS — H6123 Impacted cerumen, bilateral: Secondary | ICD-10-CM | POA: Diagnosis not present

## 2020-02-02 DIAGNOSIS — Z1159 Encounter for screening for other viral diseases: Secondary | ICD-10-CM | POA: Diagnosis not present

## 2020-02-02 DIAGNOSIS — E1169 Type 2 diabetes mellitus with other specified complication: Secondary | ICD-10-CM

## 2020-02-02 DIAGNOSIS — E781 Pure hyperglyceridemia: Secondary | ICD-10-CM

## 2020-02-02 DIAGNOSIS — H938X2 Other specified disorders of left ear: Secondary | ICD-10-CM

## 2020-02-02 DIAGNOSIS — I10 Essential (primary) hypertension: Secondary | ICD-10-CM | POA: Diagnosis not present

## 2020-02-02 DIAGNOSIS — I2510 Atherosclerotic heart disease of native coronary artery without angina pectoris: Secondary | ICD-10-CM

## 2020-02-02 DIAGNOSIS — K219 Gastro-esophageal reflux disease without esophagitis: Secondary | ICD-10-CM

## 2020-02-02 DIAGNOSIS — N904 Leukoplakia of vulva: Secondary | ICD-10-CM | POA: Insufficient documentation

## 2020-02-02 DIAGNOSIS — E785 Hyperlipidemia, unspecified: Secondary | ICD-10-CM

## 2020-02-02 DIAGNOSIS — Z7189 Other specified counseling: Secondary | ICD-10-CM | POA: Diagnosis not present

## 2020-02-02 DIAGNOSIS — Z0000 Encounter for general adult medical examination without abnormal findings: Secondary | ICD-10-CM

## 2020-02-02 LAB — POCT GLYCOSYLATED HEMOGLOBIN (HGB A1C): Hemoglobin A1C: 6.2 % — AB (ref 4.0–5.6)

## 2020-02-02 NOTE — Patient Instructions (Addendum)
  Jessica Hart , Thank you for taking time to come for your Medicare Wellness Visit. I appreciate your ongoing commitment to your health goals. Please review the following plan we discussed and let me know if I can assist you in the future.   These are the goals we discussed: Goals    . Exercise 2x per week (30 min per time) at [place] hemoglobin A1c is 6.2% and your diabetes is well controlled.  Continue your current medications.  I recommend that you stop the stool softener and schedule with Dr. Rush Landmark, your gastroenterologist regarding the vomiting and the loose stools.  Call and schedule with your gynecologist as we discussed.  You can return for your flu shot soon.  Check with your pharmacy and insurance regarding the Tdap vaccine. If it is affordable then I recommend you get it.   I will be in touch with your lab results.    This is a list of the screening recommended for you and due dates:  Health Maintenance  Topic Date Due  .  Hepatitis C: One time screening is recommended by Center for Disease Control  (CDC) for  adults born from [birth year] through 1963.   Never done  . Tetanus Vaccine  05/29/2014  . Lipid (cholesterol) test  07/30/2019  . Flu Shot  09/28/2019  . Complete foot exam   11/11/2019  . Hemoglobin A1C  02/29/2020  . Eye exam for diabetics  04/28/2020  . DEXA scan (bone density measurement)  Completed  . COVID-19 Vaccine  Completed  . Pneumonia vaccines  Completed

## 2020-02-03 LAB — COMPREHENSIVE METABOLIC PANEL
ALT: 12 IU/L (ref 0–32)
AST: 19 IU/L (ref 0–40)
Albumin/Globulin Ratio: 1.6 (ref 1.2–2.2)
Albumin: 4.7 g/dL (ref 3.7–4.7)
Alkaline Phosphatase: 131 IU/L — ABNORMAL HIGH (ref 44–121)
BUN/Creatinine Ratio: 13 (ref 12–28)
BUN: 9 mg/dL (ref 8–27)
Bilirubin Total: 0.4 mg/dL (ref 0.0–1.2)
CO2: 19 mmol/L — ABNORMAL LOW (ref 20–29)
Calcium: 9.8 mg/dL (ref 8.7–10.3)
Chloride: 107 mmol/L — ABNORMAL HIGH (ref 96–106)
Creatinine, Ser: 0.7 mg/dL (ref 0.57–1.00)
GFR calc Af Amer: 97 mL/min/{1.73_m2} (ref >59)
GFR calc non Af Amer: 84 mL/min/{1.73_m2} (ref >59)
Globulin, Total: 3 g/dL (ref 1.5–4.5)
Glucose: 91 mg/dL (ref 65–99)
Potassium: 3.9 mmol/L (ref 3.5–5.2)
Sodium: 142 mmol/L (ref 134–144)
Total Protein: 7.7 g/dL (ref 6.0–8.5)

## 2020-02-03 LAB — CBC WITH DIFFERENTIAL/PLATELET
Basophils Absolute: 0.1 10*3/uL (ref 0.0–0.2)
Basos: 1 %
EOS (ABSOLUTE): 0.1 10*3/uL (ref 0.0–0.4)
Eos: 2 %
Hematocrit: 45.1 % (ref 34.0–46.6)
Hemoglobin: 14.6 g/dL (ref 11.1–15.9)
Immature Grans (Abs): 0 10*3/uL (ref 0.0–0.1)
Immature Granulocytes: 0 %
Lymphocytes Absolute: 1.7 10*3/uL (ref 0.7–3.1)
Lymphs: 22 %
MCH: 27.6 pg (ref 26.6–33.0)
MCHC: 32.4 g/dL (ref 31.5–35.7)
MCV: 85 fL (ref 79–97)
Monocytes Absolute: 0.6 10*3/uL (ref 0.1–0.9)
Monocytes: 8 %
Neutrophils Absolute: 5.1 10*3/uL (ref 1.4–7.0)
Neutrophils: 67 %
Platelets: 359 10*3/uL (ref 150–450)
RBC: 5.29 x10E6/uL — ABNORMAL HIGH (ref 3.77–5.28)
RDW: 14.6 % (ref 11.7–15.4)
WBC: 7.7 10*3/uL (ref 3.4–10.8)

## 2020-02-03 LAB — MICROALBUMIN / CREATININE URINE RATIO
Creatinine, Urine: 143.4 mg/dL
Microalb/Creat Ratio: 9 mg/g creat (ref 0–29)
Microalbumin, Urine: 12.3 ug/mL

## 2020-02-03 LAB — LIPID PANEL
Chol/HDL Ratio: 3.8 ratio (ref 0.0–4.4)
Cholesterol, Total: 145 mg/dL (ref 100–199)
HDL: 38 mg/dL — ABNORMAL LOW (ref >39)
LDL Chol Calc (NIH): 83 mg/dL (ref 0–99)
Triglycerides: 138 mg/dL (ref 0–149)
VLDL Cholesterol Cal: 24 mg/dL (ref 5–40)

## 2020-02-03 LAB — HEPATITIS C ANTIBODY: Hep C Virus Ab: <0.1 s/co ratio (ref 0.0–0.9)

## 2020-02-03 LAB — TSH: TSH: 1.38 u[IU]/mL (ref 0.450–4.500)

## 2020-02-03 LAB — T4, FREE: Free T4: 1.19 ng/dL (ref 0.82–1.77)

## 2020-02-03 NOTE — Progress Notes (Signed)
Her labs look fine. Continue her medications.

## 2020-02-04 ENCOUNTER — Encounter (HOSPITAL_COMMUNITY)
Admission: RE | Admit: 2020-02-04 | Discharge: 2020-02-04 | Disposition: A | Discharge status: Home / Self Care | Payer: Medicare HMO | Source: Ambulatory Visit | Attending: Internal Medicine | Admitting: Internal Medicine

## 2020-02-04 ENCOUNTER — Other Ambulatory Visit: Payer: Self-pay

## 2020-02-04 VITALS — Ht 63.25 in | Wt 165.1 lb

## 2020-02-04 DIAGNOSIS — Z951 Presence of aortocoronary bypass graft: Secondary | ICD-10-CM | POA: Diagnosis not present

## 2020-02-05 ENCOUNTER — Ambulatory Visit (HOSPITAL_COMMUNITY): Payer: Medicare HMO | Attending: Cardiovascular Disease

## 2020-02-05 ENCOUNTER — Ambulatory Visit (INDEPENDENT_AMBULATORY_CARE_PROVIDER_SITE_OTHER)
Admission: RE | Admit: 2020-02-05 | Discharge: 2020-02-05 | Disposition: A | Discharge status: Home / Self Care | Payer: Medicare HMO | Source: Ambulatory Visit | Attending: Acute Care | Admitting: Acute Care

## 2020-02-05 DIAGNOSIS — I2510 Atherosclerotic heart disease of native coronary artery without angina pectoris: Secondary | ICD-10-CM | POA: Insufficient documentation

## 2020-02-05 DIAGNOSIS — Z87891 Personal history of nicotine dependence: Secondary | ICD-10-CM | POA: Diagnosis not present

## 2020-02-05 DIAGNOSIS — Z951 Presence of aortocoronary bypass graft: Secondary | ICD-10-CM | POA: Diagnosis not present

## 2020-02-05 LAB — ECHOCARDIOGRAM COMPLETE
Area-P 1/2: 3.7 cm2
S' Lateral: 3.1 cm

## 2020-02-06 ENCOUNTER — Other Ambulatory Visit: Payer: Self-pay

## 2020-02-06 ENCOUNTER — Encounter (HOSPITAL_COMMUNITY)
Admission: RE | Admit: 2020-02-06 | Discharge: 2020-02-06 | Disposition: A | Discharge status: Home / Self Care | Payer: Medicare HMO | Source: Ambulatory Visit | Attending: Internal Medicine | Admitting: Internal Medicine

## 2020-02-06 DIAGNOSIS — Z951 Presence of aortocoronary bypass graft: Secondary | ICD-10-CM | POA: Diagnosis not present

## 2020-02-06 NOTE — Progress Notes (Addendum)
Discharge Progress Report  Patient Details  Name: Jessica Hart MRN: [id number] Date of Birth: [birth year] Referring Provider:   Flowsheet Row CARDIAC REHAB PHASE II ORIENTATION from 12/09/2019 in [place]  Referring Provider Gayatri A. Margaretann Loveless, MD       Number of Visits: 22  Reason for Discharge:  Patient reached a stable level of exercise. Patient independent in their exercise. Patient has met program and personal goals.  Smoking History:  Social History   Tobacco Use  Smoking Status Former Smoker  . Packs/day: 1.00  . Years: 45.00  . Pack years: 45.00  . Quit date: 07/27/2005  . Years since quitting: 14.5  Smokeless Tobacco Former Systems developer  . 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 Margaretann Loveless, MD    Expected Discharge Date 02/06/20      NuStep   Level 2    SPM 75    Minutes 30    METs 2      Prescription Details   Frequency (times per week) 3    Duration Progress to 30 minutes of continuous aerobic without signs/symptoms of physical distress      Intensity   THRR 40-80% of Max Heartrate 57-114    Ratings of Perceived Exertion 11-13    Perceived Dyspnea 0-4      Progression   Progression Continue progressive overload as per policy without signs/symptoms or physical distress.      Resistance Training   Training Prescription Yes    Weight 2 lbs    Reps 10-15           Discharge Exercise Prescription (Final Exercise Prescription Changes):  Exercise Prescription Changes - 02/06/20 1100      Response to Exercise   Blood Pressure (Admit) 138/70    Blood Pressure (Exercise) 142/60    Blood Pressure (Exit) 142/68    Heart Rate (Admit) 86 bpm    Heart Rate (Exercise) 104 bpm    Heart Rate (Exit) 84 bpm     Rating of Perceived Exertion (Exercise) 11    Perceived Dyspnea (Exercise) 0    Symptoms None    Comments Pt graduated for the CRP2 program today.    Duration Continue with 30 min of aerobic exercise without signs/symptoms of physical distress.    Intensity THRR unchanged      Progression   Progression Continue to progress workloads to maintain intensity without signs/symptoms of physical distress.    Average METs 2.7      Resistance Training   Training Prescription Yes    Weight 2 lbs    Reps 10-15    Time 10 Minutes      Interval Training   Interval Training No      NuStep   Level 2    SPM 95    Minutes 15    METs 2.7      Track   Laps 11    Minutes 15    METs 2.39      Home Exercise Plan   Plans to continue exercise at Home (comment)    Frequency Add 2 additional days to program exercise sessions.    Initial Home Exercises Provided 01/09/20  Functional Capacity:  6 Minute Walk    Row Name 12/09/19 1050 02/04/20 1027       6 Minute Walk   Phase Initial Discharge    Distance 1097 feet 1200 feet    Distance % Change -- 9.39 %    Distance Feet Change -- 103 ft    Walk Time 6 minutes 6 minutes    # of Rest Breaks 1  Took 47 second rest break (3:56 - 4:43). 0    MPH 2.1 2.27    METS 2.1 2.3    RPE 13 11    Perceived Dyspnea  1 0    VO2 Peak 7.3 7.95    Symptoms Yes (comment) No    Comments SOB, RPD = 1 --    Resting HR 84 bpm 98 bpm    Resting BP 124/72 142/76    Resting Oxygen Saturation  95 % --    Exercise Oxygen Saturation  during 6 min walk 90 %  Pt's hands were cold. Warmed hands. --    Max Ex. HR 101 bpm 123 bpm    Max Ex. BP 140/70 160/80    2 Minute Post BP 128/70 --           Psychological, QOL, Others - Outcomes: PHQ 2/9: Depression screen [facility] 2/9 02/06/2020 02/02/2020 12/09/2019 01/29/2019 01/14/2018  Decreased Interest 0 0 0 0 0  Down, Depressed, Hopeless 0 0 0 0 0  PHQ - 2 Score 0 0 0 0 0  Some recent data might be hidden     Quality of Life:  Quality of Life - 12/09/19 1144      Quality of Life   Select Quality of Life      Quality of Life Scores   Health/Function Pre 26.03 %    Socioeconomic Pre 23.17 %    Psych/Spiritual Pre 29.14 %    Family Pre 27.6 %    GLOBAL Pre 26.41 %           Personal Goals: Goals established at orientation with interventions provided to work toward goal.  Personal Goals and Risk Factors at Admission - 12/09/19 1147      Core Components/Risk Factors/Patient Goals on Admission    Weight Management Yes;Weight Loss    Intervention Weight Management: Develop a combined nutrition and exercise program designed to reach desired caloric intake, while maintaining appropriate intake of nutrient and fiber, sodium and fats, and appropriate energy expenditure required for the weight goal.;Weight Management: Provide education and appropriate resources to help participant work on and attain dietary goals.;Weight Management/Obesity: Establish reasonable short term and long term weight goals.    Admit Weight 165 lb 12.6 oz (75.2 kg)    Expected Outcomes Short Term: Continue to assess and modify interventions until short term weight is achieved;Long Term: Adherence to nutrition and physical activity/exercise program aimed toward attainment of established weight goal;Weight Maintenance: Understanding of the daily nutrition guidelines, which includes 25-35% calories from fat, 7% or less cal from saturated fats, less than 211m cholesterol, less than 1.5gm of sodium, & 5 or more servings of fruits and vegetables daily;Weight Loss: Understanding of general recommendations for a balanced deficit meal plan, which promotes 1-2 lb weight loss per week and includes a negative energy balance of [id number] kcal/d;Understanding recommendations for meals to include 15-35% energy as protein, 25-35% energy from fat, 35-60% energy from carbohydrates, less than 2079mof dietary cholesterol, 20-35 gm of total fiber  daily;Understanding of distribution of calorie intake throughout  the day with the consumption of 4-5 meals/snacks    Diabetes Yes    Intervention Provide education about signs/symptoms and action to take for hypo/hyperglycemia.;Provide education about proper nutrition, including hydration, and aerobic/resistive exercise prescription along with prescribed medications to achieve blood glucose in normal ranges: Fasting glucose 65-99 mg/dL    Expected Outcomes Short Term: Participant verbalizes understanding of the signs/symptoms and immediate care of hyper/hypoglycemia, proper foot care and importance of medication, aerobic/resistive exercise and nutrition plan for blood glucose control.;Long Term: Attainment of HbA1C < 7%.    Hypertension Yes    Intervention Provide education on lifestyle modifcations including regular physical activity/exercise, weight management, moderate sodium restriction and increased consumption of fresh fruit, vegetables, and low fat dairy, alcohol moderation, and smoking cessation.;Monitor prescription use compliance.    Expected Outcomes Short Term: Continued assessment and intervention until BP is < 140/1m HG in hypertensive participants. < 130/876mHG in hypertensive participants with diabetes, heart failure or chronic kidney disease.;Long Term: Maintenance of blood pressure at goal levels.    Lipids Yes    Intervention Provide education and support for participant on nutrition & aerobic/resistive exercise along with prescribed medications to achieve LDL <7045mHDL >76m76m  Expected Outcomes Short Term: Participant states understanding of desired cholesterol values and is compliant with medications prescribed. Participant is following exercise prescription and nutrition guidelines.;Long Term: Cholesterol controlled with medications as prescribed, with individualized exercise RX and with personalized nutrition plan. Value goals: LDL < 70mg10mL > 40 mg.            Personal  Goals Discharge:  Goals and Risk Factor Review    Row Name 12/09/19 1042 12/24/19 1631 01/16/20 1324 02/06/20 0953       Core Components/Risk Factors/Patient Goals Review   Personal Goals Review -- Weight Management/Obesity;Lipids;Hypertension;Diabetes Weight Management/Obesity;Lipids;Hypertension;Diabetes Weight Management/Obesity;Lipids;Hypertension;Diabetes    Review -- Gwen Meredith Modyhad some moderate resting and exertional BP elevation. CBG's have been stable Gwen has been doing well with exercise. Gwen's Blood pressure readings have improved. CBG's have been stable. Gwen completed exercise at cardiac rehab on 02/06/20. Gwen did well with exercise. Gwen's blood pressures were improved at discharge    Expected Outcomes -- Gwen [name] continue to partcipate in phase 2 cardiac rehab for exercise, nutrtion and lifestyle modifications. Will continue to monitor BP Gwen will continue to partcipate in phase 2 cardiac rehab for exercise, nutrtion and lifestyle modifications. Will continue to monitor BP Gwen will continue to exercise upon graduation follow lifestyle and nutrtional modifications           Exercise Goals and Review:  Exercise Goals    Row Name 12/09/19 1154             Exercise Goals   Increase Physical Activity Yes       Expected Outcomes Short Term: Attend rehab on a regular basis to increase amount of physical activity.;Long Term: Add in home exercise to make exercise part of routine and to increase amount of physical activity.;Long Term: Exercising regularly at least 3-5 days a week.       Increase Strength and Stamina Yes       Intervention Provide advice, education, support and counseling about physical activity/exercise needs.;Develop an individualized exercise prescription for aerobic and resistive training based on initial evaluation findings, risk stratification, comorbidities and participant's personal goals.       Expected Outcomes Short Term: Increase workloads from initial  exercise prescription for resistance, speed, and METs.;Short Term: Perform resistance training  exercises routinely during rehab and add in resistance training at home;Long Term: Improve cardiorespiratory fitness, muscular endurance and strength as measured by increased METs and functional capacity (6MWT)       Able to understand and use rate of perceived exertion (RPE) scale Yes       Intervention Provide education and explanation on how to use RPE scale       Expected Outcomes Short Term: Able to use RPE daily in rehab to express subjective intensity level;Long Term:  Able to use RPE to guide intensity level when exercising independently       Knowledge and understanding of Target Heart Rate Range (THRR) Yes       Intervention Provide education and explanation of THRR including how the numbers were predicted and where they are located for reference       Expected Outcomes Short Term: Able to state/look up THRR;Short Term: Able to use daily as guideline for intensity in rehab;Long Term: Able to use THRR to govern intensity when exercising independently       Understanding of Exercise Prescription Yes       Intervention Provide education, explanation, and written materials on patient's individual exercise prescription       Expected Outcomes Short Term: Able to explain program exercise prescription;Long Term: Able to explain home exercise prescription to exercise independently              Exercise Goals Re-Evaluation:  Exercise Goals Re-Evaluation    Row Name 12/15/19 1104 01/09/20 1045 02/06/20 1134         Exercise Goal Re-Evaluation   Exercise Goals Review Increase Physical Activity;Increase Strength and Stamina;Able to understand and use rate of perceived exertion (RPE) scale Increase Physical Activity;Increase Strength and Stamina;Able to understand and use rate of perceived exertion (RPE) scale;Knowledge and understanding of Target Heart Rate Range (THRR);Understanding of Exercise  Prescription;Able to check pulse independently Increase Physical Activity;Increase Strength and Stamina;Able to understand and use rate of perceived exertion (RPE) scale;Knowledge and understanding of Target Heart Rate Range (THRR);Able to check pulse independently;Understanding of Exercise Prescription     Comments Pt's first day of exercise. Pt tolerated first day of exercise fairly okay. Pt requested to add walking track to prescription. Will continue to monitor knee discomfort. Pt oriented to exercise equipment. Reviewed home exercise prescription and reviewed with patient how to palpate her pulse. Pt agres to try to add 2x/week - 30 minutes of walking. Pt verbalized understanding of the honme exercise Rx and was provided a copy. Pt graduated from the [facility] program today. Pt made good progress and had an average MET level of 2.7. The patient will continue her exercise at the [facility] through the [place] porgram, She will be taking an aerobics class and walking the track. 5-7x/week for 30-45 minutes encouraged.     Expected Outcomes Pt will continue to increase strength and stamina. Pt will work towards walking for 15 minutes with minimal rest breaks. Pt will exercise at home by walking 2x/week for 30 minutes. Pt will continue to exercise on her own at [facility].            Nutrition & Weight - Outcomes:  Pre Biometrics - 12/09/19 1000      Pre Biometrics   Waist Circumference 39 inches    Hip Circumference 41 inches    Waist to Hip Ratio 0.95 %    Triceps Skinfold 32 mm    % Body Fat 42.7 %    Grip Strength  37 kg    Flexibility 15.5 in    Single Leg Stand 6.06 seconds   Moderate risk for fall          Post Biometrics - 02/04/20 1030       Post  Biometrics   Height 5' 3.25" (1.607 m)    Weight 74.9 kg    Waist Circumference 38 inches    Hip Circumference 40 inches    Waist to Hip Ratio 0.95 %    BMI (Calculated) 29    Triceps Skinfold 33 mm    % Body Fat 42.5 %    Grip  Strength 32 kg    Flexibility 16 in    Single Leg Stand 5.43 seconds           Nutrition:  Nutrition Therapy & Goals - 12/17/19 1027      Nutrition Therapy   Diet heart Healthy/carb mod    Drug/Food Interactions Statins/Certain Fruits      Personal Nutrition Goals   Nutrition Goal Pt to build a healthy plate including vegetables, fruits, whole grains, and low-fat dairy products in a heart healthy meal plan.    Personal Goal #2 Pt to learn basic cooking skills      Intervention Plan   Intervention Prescribe, educate and counsel regarding individualized specific dietary modifications aiming towards targeted core components such as weight, hypertension, lipid management, diabetes, heart failure and other comorbidities.;Nutrition handout(s) given to patient.    Expected Outcomes Short Term Goal: Understand basic principles of dietary content, such as calories, fat, sodium, cholesterol and nutrients.           Nutrition Discharge:  Nutrition Assessments - 02/13/20 1441      MEDFICTS Scores   Post Score 40           Education Questionnaire Score:  Knowledge Questionnaire Score - 02/06/20 1147      Knowledge Questionnaire Score   Post Score 19/24           Goals reviewed with patient; copy given to patient.Gwen graduated from cardiac rehab program today with completion of 22 exercise sessions in Phase II. Pt maintained good attendance and progressed nicely during his participation in rehab as evidenced by increased MET level.   Medication list reconciled. Repeat  PHQ score- 0 .  Pt has made significant lifestyle changes and should be commended for her success. Pt feels she has achieved her goals during cardiac rehab.  Gwen increased her distance on her post exercise walk test by 103 feet. Pt plans to continue exercise by going to the [facility] or the [facility]. We are proud of Gwen's progress! Barnet Pall, RN,BSN 02/18/2020 2:10 PM

## 2020-02-09 ENCOUNTER — Other Ambulatory Visit: Payer: Self-pay | Admitting: Family Medicine

## 2020-02-09 DIAGNOSIS — E785 Hyperlipidemia, unspecified: Secondary | ICD-10-CM | POA: Diagnosis not present

## 2020-02-09 DIAGNOSIS — E1165 Type 2 diabetes mellitus with hyperglycemia: Secondary | ICD-10-CM

## 2020-02-09 DIAGNOSIS — E1169 Type 2 diabetes mellitus with other specified complication: Secondary | ICD-10-CM | POA: Diagnosis not present

## 2020-02-09 LAB — LIPID PANEL
Chol/HDL Ratio: 3.6 ratio (ref 0.0–4.4)
Cholesterol, Total: 127 mg/dL (ref 100–199)
HDL: 35 mg/dL — ABNORMAL LOW (ref >39)
LDL Chol Calc (NIH): 73 mg/dL (ref 0–99)
Triglycerides: 104 mg/dL (ref 0–149)
VLDL Cholesterol Cal: 19 mg/dL (ref 5–40)

## 2020-02-10 ENCOUNTER — Ambulatory Visit (INDEPENDENT_AMBULATORY_CARE_PROVIDER_SITE_OTHER): Payer: Medicare HMO | Admitting: Pharmacist Clinician (PhC)/ Clinical Pharmacy Specialist

## 2020-02-10 ENCOUNTER — Other Ambulatory Visit: Payer: Self-pay

## 2020-02-10 DIAGNOSIS — E1169 Type 2 diabetes mellitus with other specified complication: Secondary | ICD-10-CM | POA: Diagnosis not present

## 2020-02-10 DIAGNOSIS — E785 Hyperlipidemia, unspecified: Secondary | ICD-10-CM

## 2020-02-10 MED ORDER — ROSUVASTATIN CALCIUM 10 MG PO TABS: 10.0000 mg | ORAL_TABLET | Freq: Every day | ORAL | 3 refills | Status: DC

## 2020-02-10 NOTE — Assessment & Plan Note (Signed)
Patient with hyperlipidemia, goal will be to get a 50% reduction in LDL, or to about 40.  Will switch her from lovastatin 20 mg daily to rosuvastatin 10 mg.  She should continue to follow a healthy diet and get back into regular exercise once the holidays are over.  We will repeat labs in about 8-10 weeks and she is scheduled to see Dr. Margaretann Loveless in early March for follow up.

## 2020-02-10 NOTE — Patient Instructions (Signed)
Your Results:             Your most recent labs Goal  Total Cholesterol 127 < 200  Triglycerides 104 < 150  HDL (happy/good cholesterol) 35 > 40  LDL (lousy/bad cholesterol 73 50% drop ~40-45   Medication changes:  Stop lovastatin and start rosuvastatin 10 mg once daily (morning or evening)  Lab orders:  We will have you repeat cholesterol labs in about 8-10 weeks to see how they are doing.  We will mail a lab order to you closer to that time.   Thank you for choosing [facility]

## 2020-02-10 NOTE — Progress Notes (Signed)
02/10/2020 Jessica Hart [birth year] [id number]   HPI:  Jessica Hart is a 79 y.o. female patient of Dr Margaretann Loveless, who presents today for a lipid clinic evaluation.  See pertinent past medical history below.  She had CABG x 3 back in July of this year, and cholesterol labs drawn in the year prior to the event showed her LDL to be consistently in the 60-80 range.  In fact, her highest LDL recorded, going back 5 years, was just a few weeks ago, and came in at 28.  Based on the fact that she needed bypass x 3, will aim to lower her LDL cholesterol by 50% - to a goal of around 40.  Patient reports being on pravastatin for multiple years, and does not recall having tried any other statin drugs.    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 Creon 2 w/each meal   Current Medications: lovastatin 20 mg qd  Cholesterol Goals: LDL < 70   Intolerant/previously tried: lisinopril - cough  Family history: mother with heart issues, died at 62 with respiratory issues, father died at 88, heart issues; 1 sister died at 73 complications of rheumatic fever; another sister deceased due to heart issues, DM. 2 brothers with heart issues  Diet: mix of home cooked and eating out - not much fast food; f/v fresh in summer, f/c in winter; does rinse cnaned veggies  Exercise:  Just finished cardiac rehab, going back to [facility] at first of year  Labs: 02/09/20 - TC 127, TG 104, HLD 35, LDL 73   Current Outpatient Medications  Medication Sig Dispense Refill  . acetaminophen (TYLENOL) 500 MG tablet Take 2 tablets (1,000 mg total) by mouth every 8 (eight) hours as needed. (Patient taking differently: Take 1,000 mg by mouth 2 (two) times daily.) 30 tablet 2  . amLODipine (NORVASC) 10 MG tablet Take 1 tablet (10 mg total) by mouth daily. 30 tablet 3   . aspirin EC 81 MG tablet Take 1 tablet (81 mg total) by mouth daily. Swallow whole. 90 tablet 3  . clobetasol cream (TEMOVATE) 1.93 % Apply 1 application topically 2 (two) times daily.    . dapagliflozin propanediol (FARXIGA) 10 MG TABS tablet Take 1 tablet (10 mg total) by mouth daily. 30 tablet 3  . insulin degludec (TRESIBA FLEXTOUCH) 100 UNIT/ML SOPN FlexTouch Pen Inject 0.1 mLs (10 Units total) into the skin daily after supper. 2 pen 0  . Insulin Pen Needle (PEN NEEDLES 5/16") 30G X 8 MM MISC Pen needles used for lantus injections 100 each 3  . lipase/protease/amylase (CREON) 36000 UNITS CPEP capsule Take 2 capsules with each meal three times daily  Take 1 capsule with each snack twice daily (Patient taking differently: Take 36,000-72,000 Units by mouth See admin instructions. Take 2 capsules with each meal three times daily  Take 1 capsule with each snack twice daily) 240 capsule 11  . losartan (COZAAR) 25 MG tablet Take 1 tablet (25 mg total) by mouth daily. 30 tablet 2  . metFORMIN (GLUCOPHAGE) 1000 MG tablet TAKE 1 TABLET BY MOUTH TWICE DAILY WITH A MEAL 180 tablet 0  . Metoprolol Tartrate 37.5 MG TABS Take 37.5 mg by mouth 2 (two) times daily. 90 tablet 3  . omeprazole (PRILOSEC) 40 MG capsule TAKE 1 CAPSULE BY MOUTH EVERY DAY 90  capsule 0  . OneTouch Delica Lancets 80S MISC USE TO TEST BLOOD SUGAR ONCE A DAY 100 each 1  . ONETOUCH VERIO test strip USE 1 STRIP TO CHECK GLUCOSE ONCE DAILY 100 strip 1  . rosuvastatin (CRESTOR) 10 MG tablet Take 1 tablet (10 mg total) by mouth daily. 30 tablet 3   No current facility-administered medications for this visit.    Allergies  Allergen Reactions  . Lisinopril Itching and Cough    Past Medical History:  Diagnosis Date  . Arthritis   . Cataract bilaterl 3 years ago  . Coronary artery disease   . Diabetes mellitus 02/27/2005  . Diabetes mellitus type 2, controlled, without complications ([place]) 10/07/313   Qualifier: Diagnosis of  By:  Drucie Ip    . Diverticulosis   . Dyspnea   . Echocardiogram findings abnormal, without diagnosis 2005   EF 47%, no ischemia, no infarction  . Former smoker 01/10/2017   45 pack year history, quit in 2007   . GERD (gastroesophageal reflux disease)   . Heart murmur   . History of bone density study 2006   Lt hip = -1.0, L spine = -1.8   . Hyperlipidemia   . Hypertension     There were no vitals taken for this visit.   Hyperlipidemia associated with type 2 diabetes mellitus ([place]) Patient with hyperlipidemia, goal will be to get a 50% reduction in LDL, or to about 40.  Will switch her from lovastatin 20 mg daily to rosuvastatin 10 mg.  She should continue to follow a healthy diet and get back into regular exercise once the holidays are over.  We will repeat labs in about 8-10 weeks and she is scheduled to see Dr. Margaretann Loveless in early March for follow up.     Tommy Medal PharmD CPP [facility] [address] [place], [place] [address] [phone]

## 2020-02-12 DIAGNOSIS — R69 Illness, unspecified: Secondary | ICD-10-CM | POA: Diagnosis not present

## 2020-02-13 ENCOUNTER — Other Ambulatory Visit: Payer: Self-pay | Admitting: *Deleted

## 2020-02-13 ENCOUNTER — Telehealth: Payer: Self-pay | Admitting: Internal Medicine

## 2020-02-13 DIAGNOSIS — Z87891 Personal history of nicotine dependence: Secondary | ICD-10-CM

## 2020-02-13 NOTE — Progress Notes (Signed)
Please call patient and let them  know their  low dose Ct was read as a Lung RADS 2: nodules that are benign in appearance and behavior with a very low likelihood of becoming a clinically active cancer due to size or lack of growth. Recommendation per radiology is for a repeat LDCT in 12 months. .Please let them  know we will order and schedule their  annual screening scan for 01/2021. Please let them  know there was notation of CAD on their  scan.  Please remind the patient  that this is a non-gated exam therefore degree or severity of disease  cannot be determined. Please have them  follow up with their PCP regarding potential risk factor modification, dietary therapy or pharmacologic therapy if clinically indicated. Pt.  is  currently on statin therapy. Please place order for annual  screening scan for  01/2021 and fax results to PCP. Thanks so much.  Langley Gauss, there are findings on her scan concerning for ILD. Can you ask her if she would like to be referred to either [facility] or [facility] for follow up? If she would like to talk with her PCP about it first that is also fine, but she should have follow up. Thanks so much

## 2020-02-13 NOTE — Telephone Encounter (Signed)
Pt was advised to contact pulmonology about scheduling an appointment.   [facility]- [phone]

## 2020-02-13 NOTE — Telephone Encounter (Signed)
-----  Message from Girtha Rm, NP-C sent at 02/13/2020  2:43 PM EST -----   ----- Message ----- From: Christie Beckers, RN Sent: 02/13/2020   9:35 AM EST To: Girtha Rm, NP-C

## 2020-02-13 NOTE — Progress Notes (Signed)
Please let her know that I reviewed her CT results and I agree with her pulmonologist in regards to seeing one the their specialists for interstitial lung disease.

## 2020-03-11 ENCOUNTER — Other Ambulatory Visit: Payer: Self-pay | Admitting: Family Medicine

## 2020-03-14 ENCOUNTER — Other Ambulatory Visit: Payer: Self-pay | Admitting: Internal Medicine

## 2020-03-19 ENCOUNTER — Other Ambulatory Visit: Payer: Self-pay | Admitting: Internal Medicine

## 2020-03-20 ENCOUNTER — Other Ambulatory Visit: Payer: Self-pay | Admitting: Family Medicine

## 2020-03-22 ENCOUNTER — Other Ambulatory Visit: Payer: Self-pay

## 2020-03-22 ENCOUNTER — Ambulatory Visit (INDEPENDENT_AMBULATORY_CARE_PROVIDER_SITE_OTHER): Payer: Medicare HMO | Admitting: Otolaryngology

## 2020-03-22 VITALS — Temp 96.3°F

## 2020-03-22 DIAGNOSIS — R42 Dizziness and giddiness: Secondary | ICD-10-CM | POA: Diagnosis not present

## 2020-03-22 DIAGNOSIS — H6123 Impacted cerumen, bilateral: Secondary | ICD-10-CM

## 2020-03-22 NOTE — Telephone Encounter (Signed)
Pt no longer on this. Was switched to crestor

## 2020-03-22 NOTE — Progress Notes (Signed)
HPI: Jessica Hart is a 80 y.o. female who presents is referred by PCP for evaluation of earwax in both ears.  She apparently was having this cleaned at the office and has some bleeding.  She also describes some dizziness.  She is not complaining of any significant hearing problems.  She does not wear hearing aids..  Past Medical History:  Diagnosis Date  . Arthritis   . Cataract bilaterl 3 years ago  . Coronary artery disease   . Diabetes mellitus 02/27/2005  . Diabetes mellitus type 2, controlled, without complications ([place]) 08/10/4313   Qualifier: Diagnosis of  By: Drucie Ip    . Diverticulosis   . Dyspnea   . Echocardiogram findings abnormal, without diagnosis 2005   EF 47%, no ischemia, no infarction  . Former smoker 01/10/2017   45 pack year history, quit in 2007   . GERD (gastroesophageal reflux disease)   . Heart murmur   . History of bone density study 2006   Lt hip = -1.0, L spine = -1.8   . Hyperlipidemia   . Hypertension    Past Surgical History:  Procedure Laterality Date  . ABDOMINAL HYSTERECTOMY  1989   one ovary remains per patient  . CARDIAC CATHETERIZATION  08/20/2019  . COLONOSCOPY    . CORONARY ARTERY BYPASS GRAFT N/A 09/02/2019   Procedure: CORONARY ARTERY BYPASS GRAFTING (CABG) TIMES THREE USING LEFT INTERNAL MAMMARY ARTERY AND RIGHT GREATER SAPHENOUS VEIN;  Surgeon: Lajuana Matte, MD;  Location: [facility];  Service: Open Heart Surgery;  Laterality: N/A;  . ENDOVEIN HARVEST OF GREATER SAPHENOUS VEIN Right 09/02/2019   Procedure: ENDOVEIN HARVEST OF GREATER SAPHENOUS VEIN;  Surgeon: Lajuana Matte, MD;  Location: [facility];  Service: Open Heart Surgery;  Laterality: Right;  . EYE SURGERY Bilateral    CATARACT  . LEFT HEART CATH AND CORONARY ANGIOGRAPHY N/A 08/20/2019   Procedure: LEFT HEART CATH AND CORONARY ANGIOGRAPHY;  Surgeon: Belva Crome, MD;  Location: [facility];  Service: Cardiovascular;  Laterality: N/A;  . TEE WITHOUT  CARDIOVERSION N/A 09/02/2019   Procedure: TRANSESOPHAGEAL ECHOCARDIOGRAM (TEE);  Surgeon: Lajuana Matte, MD;  Location: [facility];  Service: Open Heart Surgery;  Laterality: N/A;  . TONSILECTOMY, ADENOIDECTOMY, BILATERAL MYRINGOTOMY AND TUBES  1965  . TONSILLECTOMY     Social History   Socioeconomic History  . Marital status: Married    Spouse name: Richard  . Number of children: 1  . Years of education: 76  . Highest education level: High school graduate  Occupational History  . Occupation: Retire-Textile    Employer: RETIRED  Tobacco Use  . Smoking status: Former Smoker    Packs/day: 1.00    Years: 45.00    Pack years: 45.00    Quit date: 07/27/2005    Years since quitting: 14.6  . Smokeless tobacco: Former Systems developer    Quit date: 03/2005  Vaping Use  . Vaping Use: Never used  Substance and Sexual Activity  . Alcohol use: Yes    Comment: beer occasionally   . Drug use: No  . Sexual activity: Not Currently    Partners: Male    Comment: 1st intercourse-22, partners- 76, married- 29 yrs   Other Topics Concern  . Not on file  Social History Narrative   Health Care POA:    Emergency Contact: husband, Jenney Brester   End of Life Plan:    Who lives with you: Lives with husband   Any pets: gold fish  Diet: Patient has a varied diet and reports stuggeling with portion size and ice cream.   Exercise: Patient does not have currently exercise routine.   Seatbelts: Patient reports wearing seatbelt when in vehicle.   Sun Exposure/Protection: Patient reports not using sun protection.   Hobbies: Bingo         Social Determinants of Health   Financial Resource Strain: Not on file  Food Insecurity: Not on file  Transportation Needs: Not on file  Physical Activity: Not on file  Stress: Not on file  Social Connections: Not on file   Family History  Problem Relation Age of Onset  . Heart disease Father   . Cancer Sister   . Tongue cancer Sister   . Colon polyps Neg Hx   .  Rectal cancer Neg Hx   . Stomach cancer Neg Hx    Allergies  Allergen Reactions  . Lisinopril Itching and Cough   Prior to Admission medications   Medication Sig Start Date End Date Taking? Authorizing Provider  acetaminophen (TYLENOL) 500 MG tablet Take 2 tablets (1,000 mg total) by mouth every 8 (eight) hours as needed. Patient taking differently: Take 1,000 mg by mouth 2 (two) times daily. 09/07/19   Nani Skillern, PA-C  amLODipine (NORVASC) 10 MG tablet Take 1 tablet (10 mg total) by mouth daily. 01/13/20   Elouise Munroe, MD  aspirin EC 81 MG tablet Take 1 tablet (81 mg total) by mouth daily. Swallow whole. 12/26/19   Elouise Munroe, MD  clobetasol cream (TEMOVATE) 8.54 % Apply 1 application topically 2 (two) times daily.    [provider]  dapagliflozin propanediol (FARXIGA) 10 MG TABS tablet Take 1 tablet (10 mg total) by mouth daily. 01/12/20   Elouise Munroe, MD  insulin degludec (TRESIBA FLEXTOUCH) 100 UNIT/ML SOPN FlexTouch Pen Inject 0.1 mLs (10 Units total) into the skin daily after supper. 10/08/18   Henson, Vickie L, NP-C  Insulin Pen Needle (PEN NEEDLES 5/16") 30G X 8 MM MISC Pen needles used for lantus injections 05/04/15   Henson, Vickie L, NP-C  lipase/protease/amylase (CREON) 36000 UNITS CPEP capsule Take 2 capsules with each meal three times daily  Take 1 capsule with each snack twice daily Patient taking differently: Take 36,000-72,000 Units by mouth See admin instructions. Take 2 capsules with each meal three times daily  Take 1 capsule with each snack twice daily 12/19/18   Nandigam, Venia Minks, MD  losartan (COZAAR) 25 MG tablet Take 1 tablet (25 mg total) by mouth daily. 12/29/19   Elouise Munroe, MD  metFORMIN (GLUCOPHAGE) 1000 MG tablet TAKE 1 TABLET BY MOUTH TWICE DAILY WITH A MEAL 03/11/20   Henson, Vickie L, NP-C  Metoprolol Tartrate 37.5 MG TABS Take 37.5 mg by mouth 2 (two) times daily. 10/09/19   Elouise Munroe, MD  omeprazole  (PRILOSEC) 40 MG capsule TAKE 1 CAPSULE BY MOUTH EVERY DAY 01/12/20   Henson, Vickie L, NP-C  OneTouch Delica Lancets 62V MISC USE TO TEST BLOOD SUGAR ONCE A DAY 12/26/19   Henson, Vickie L, NP-C  ONETOUCH VERIO test strip USE 1 STRIP TO CHECK GLUCOSE ONCE DAILY 11/12/19   Henson, Vickie L, NP-C  rosuvastatin (CRESTOR) 10 MG tablet Take 1 tablet (10 mg total) by mouth daily. 02/10/20 03/11/20  Elouise Munroe, MD     Positive ROS: Otherwise negative  All other systems have been reviewed and were otherwise negative with the exception of those mentioned in the HPI and  as above.  Physical Exam: Constitutional: Alert, well-appearing, no acute distress Ears: External ears without lesions or tenderness.  She has a moderate amount of wax in both ear canals that was cleaned with suction and curettes.  TMs were clear bilaterally.  On Dix-Hallpike testing she had no nystagmus or vertigo although she was a little off balance on getting up and down from the table. Nasal: External nose without lesions. Septum mild deformity. Clear nasal passages otherwise. Oral: Lips and gums without lesions. Tongue and palate mucosa without lesions. Posterior oropharynx clear. Neck: No palpable adenopathy or masses Respiratory: Breathing comfortably  Skin: No facial/neck lesions or rash noted.  Cerumen impaction removal  Date/Time: 03/22/2020 6:36 PM Performed by: Rozetta Nunnery, MD Authorized by: Rozetta Nunnery, MD   Consent:    Consent obtained:  Verbal   Consent given by:  Patient   Risks discussed:  Pain and bleeding Procedure details:    Location:  L ear and R ear   Procedure type: curette, suction and forceps   Post-procedure details:    Inspection:  TM intact and canal normal   Hearing quality:  Improved   Patient tolerance of procedure:  Tolerated well, no immediate complications Comments:     TMs are clear bilaterally.    Assessment: Wax buildup in both ear canals that was  cleaned in the office.  TMs are clear bilaterally.  Plan: She will follow up as needed.   Radene Journey, MD   CC:

## 2020-03-25 ENCOUNTER — Telehealth: Payer: Self-pay | Admitting: Gastroenterology

## 2020-03-25 NOTE — Telephone Encounter (Signed)
*  STAT* If patient is at the pharmacy, call can be transferred to refill team.   1. Which medications need to be refilled? (please list name of each medication and dose if known)  Losartan and Metoprolol  2. Which pharmacy/location (including street and city if local pharmacy) is medication to be sent to? [facility]  3. Do they need a 30 day or 90 day supply? 90 days and refills

## 2020-03-25 NOTE — Telephone Encounter (Signed)
Dr Silverio Decamp Please advise on whether or not this patient needs to continue Creon

## 2020-03-25 NOTE — Telephone Encounter (Signed)
Inbound call from patient stating she did not renew the assistance for Creon medication. Wanting know if she still needs to be taking the medication and if so, needs help renewing application please.

## 2020-03-25 NOTE — Telephone Encounter (Signed)
Please schedule office follow up visit to discuss medication management. Thanks

## 2020-03-25 NOTE — Telephone Encounter (Signed)
Spoke with patient scheduled her a follow up for 3/17

## 2020-03-26 ENCOUNTER — Other Ambulatory Visit: Payer: Self-pay | Admitting: Internal Medicine

## 2020-03-26 DIAGNOSIS — E1165 Type 2 diabetes mellitus with hyperglycemia: Secondary | ICD-10-CM

## 2020-03-30 ENCOUNTER — Telehealth: Payer: Self-pay | Admitting: *Deleted

## 2020-03-30 ENCOUNTER — Other Ambulatory Visit: Payer: Self-pay | Admitting: Family Medicine

## 2020-03-30 NOTE — Telephone Encounter (Signed)
Pt is on lower dose per cardiology

## 2020-03-30 NOTE — Telephone Encounter (Signed)
Patient requesting refills for metoprolol

## 2020-04-24 ENCOUNTER — Other Ambulatory Visit: Payer: Self-pay | Admitting: Family Medicine

## 2020-04-24 DIAGNOSIS — K219 Gastro-esophageal reflux disease without esophagitis: Secondary | ICD-10-CM

## 2020-04-29 DIAGNOSIS — Z794 Long term (current) use of insulin: Secondary | ICD-10-CM | POA: Diagnosis not present

## 2020-04-29 DIAGNOSIS — Z961 Presence of intraocular lens: Secondary | ICD-10-CM | POA: Diagnosis not present

## 2020-04-29 DIAGNOSIS — E119 Type 2 diabetes mellitus without complications: Secondary | ICD-10-CM | POA: Diagnosis not present

## 2020-04-29 DIAGNOSIS — H524 Presbyopia: Secondary | ICD-10-CM | POA: Diagnosis not present

## 2020-04-29 LAB — HM DIABETES EYE EXAM

## 2020-05-03 ENCOUNTER — Other Ambulatory Visit: Payer: Self-pay | Admitting: Family Medicine

## 2020-05-03 DIAGNOSIS — Z1231 Encounter for screening mammogram for malignant neoplasm of breast: Secondary | ICD-10-CM

## 2020-05-04 ENCOUNTER — Other Ambulatory Visit: Payer: Self-pay

## 2020-05-04 ENCOUNTER — Ambulatory Visit: Payer: Medicare HMO | Admitting: Internal Medicine

## 2020-05-04 ENCOUNTER — Encounter: Payer: Self-pay | Admitting: Internal Medicine

## 2020-05-04 VITALS — BP 116/62 | HR 70 | Ht 63.0 in | Wt 168.0 lb

## 2020-05-04 DIAGNOSIS — E1169 Type 2 diabetes mellitus with other specified complication: Secondary | ICD-10-CM | POA: Diagnosis not present

## 2020-05-04 DIAGNOSIS — E1165 Type 2 diabetes mellitus with hyperglycemia: Secondary | ICD-10-CM

## 2020-05-04 DIAGNOSIS — I2510 Atherosclerotic heart disease of native coronary artery without angina pectoris: Secondary | ICD-10-CM | POA: Diagnosis not present

## 2020-05-04 DIAGNOSIS — Z87891 Personal history of nicotine dependence: Secondary | ICD-10-CM

## 2020-05-04 DIAGNOSIS — I10 Essential (primary) hypertension: Secondary | ICD-10-CM | POA: Diagnosis not present

## 2020-05-04 DIAGNOSIS — Z951 Presence of aortocoronary bypass graft: Secondary | ICD-10-CM

## 2020-05-04 DIAGNOSIS — E785 Hyperlipidemia, unspecified: Secondary | ICD-10-CM | POA: Diagnosis not present

## 2020-05-04 DIAGNOSIS — Z794 Long term (current) use of insulin: Secondary | ICD-10-CM

## 2020-05-04 NOTE — Patient Instructions (Signed)
Medication Instructions:  No Changes In Medications at this time.  *If you need a refill on your cardiac medications before your next appointment, please call your pharmacy*  Lab Work: LIPIDS/CMP- PLEASE RETURN TO OFFICE TO HAVE THIS DRAWN. YOU WILL NEED TO BE FASTING. NOTHING TO EAT OR DRINK AFTER MIDNIGHT THE NIGHT BEFORE  If you have labs (blood work) drawn today and your tests are completely normal, you will receive your results only by: [place] MyChart Message (if you have MyChart) OR . A paper copy in the mail If you have any lab test that is abnormal or we need to change your treatment, we will call you to review the results.  Follow-Up: At [facility], you and your health needs are our priority.  As part of our continuing mission to provide you with exceptional heart care, we have created designated Provider Care Teams.  These Care Teams include your primary Cardiologist (physician) and Advanced Practice Providers (APPs -  Physician Assistants and Nurse Practitioners) who all work together to provide you with the care you need, when you need it.  We recommend signing up for the patient portal called "MyChart".  Sign up information is provided on this After Visit Summary.  MyChart is used to connect with patients for Virtual Visits (Telemedicine).  Patients are able to view lab/test results, encounter notes, upcoming appointments, etc.  Non-urgent messages can be sent to your provider as well.   To learn more about what you can do with MyChart, go to [email or web address].    Your next appointment:   6 month(s)  The format for your next appointment:   In Person  Provider:   Cherlynn Kaiser, MD

## 2020-05-04 NOTE — Progress Notes (Signed)
Cardiology Office Note:    Date:  05/04/2020   ID:  Jessica Hart, DOB 11-Jul-1940, MRN [id number]  PCP:  Girtha Rm, NP-C  Cardiologist:  Elouise Munroe, MD  Electrophysiologist:  None   Referring MD: Girtha Rm, NP-C   Chief Complaint/Reason for Referral: CAD s/p CABG  History of Present Illness:    Jessica Hart is a 80 y.o. female with a history of diabetes mellitus type 2, hypertension, hyperlipidemia, GERD, remote tobacco abuse who presents for follow-up after CABG x3 LIMA to LAD, reverse saphenous vein graft to PDA and OM1.  She was initially referred for coronary angiography given progressive chest pain and infarct seen on stress test inferior and inferolateral with nuclear stress EF of 36%.  Coronary angiography showed severe three-vessel coronary artery obstructive disease with severe three-vessel calcification.  Coronary artery bypass grafting performed on 09/02/2019.   An echo was performed on 08-29-19 demonstrating mild mitral valve regurgitation and estimated ejection fraction 40 to 45% with regional wall motion abnormalities.  Raking with mild SOB. No CP.  Occasional shooting left leg cramp.   Doing well on crestor 10 mg daily. Will get labs.   Echo reviewed with patient. Mild LVH, and I am unable to reproduce the readers 18 mm measurement. I have independently reviewed the images multiple times. I think strain was not performed well therefore cannot be accurately reported (may be falsely low values due to suboptimal tracking). Discussed in great detail with patient and we decided in the absence of concerning strain patterns, no syncope or signs of arrhythmia, there is no strong indication to screen for HCM or amyloid at this time. Will repeat an echo with strain in 6-12 mo.   Past Medical History:  Diagnosis Date   Arthritis    Cataract bilaterl 3 years ago   Coronary artery disease    Diabetes mellitus 02/27/2005   Diabetes mellitus type 2,  controlled, without complications ([place]) 09/02/2374   Qualifier: Diagnosis of  By: Drucie Ip     Diverticulosis    Dyspnea    Echocardiogram findings abnormal, without diagnosis 2005   EF 47%, no ischemia, no infarction   Former smoker 01/10/2017   45 pack year history, quit in 2007    GERD (gastroesophageal reflux disease)    Heart murmur    History of bone density study 2006   Lt hip = -1.0, L spine = -1.8    Hyperlipidemia    Hypertension     Past Surgical History:  Procedure Laterality Date   ABDOMINAL HYSTERECTOMY  1989   one ovary remains per patient   CARDIAC CATHETERIZATION  08/20/2019   COLONOSCOPY     CORONARY ARTERY BYPASS GRAFT N/A 09/02/2019   Procedure: CORONARY ARTERY BYPASS GRAFTING (CABG) TIMES THREE USING LEFT INTERNAL MAMMARY ARTERY AND RIGHT GREATER SAPHENOUS VEIN;  Surgeon: Lajuana Matte, MD;  Location: [facility];  Service: Open Heart Surgery;  Laterality: N/A;   ENDOVEIN HARVEST OF GREATER SAPHENOUS VEIN Right 09/02/2019   Procedure: ENDOVEIN HARVEST OF GREATER SAPHENOUS VEIN;  Surgeon: Lajuana Matte, MD;  Location: [facility];  Service: Open Heart Surgery;  Laterality: Right;   EYE SURGERY Bilateral    CATARACT   LEFT HEART CATH AND CORONARY ANGIOGRAPHY N/A 08/20/2019   Procedure: LEFT HEART CATH AND CORONARY ANGIOGRAPHY;  Surgeon: Belva Crome, MD;  Location: [facility];  Service: Cardiovascular;  Laterality: N/A;   TEE WITHOUT CARDIOVERSION N/A 09/02/2019   Procedure: TRANSESOPHAGEAL  ECHOCARDIOGRAM (TEE);  Surgeon: Lajuana Matte, MD;  Location: [facility];  Service: Open Heart Surgery;  Laterality: N/A;   TONSILECTOMY, ADENOIDECTOMY, BILATERAL MYRINGOTOMY AND TUBES  1965   TONSILLECTOMY      Current Medications: Current Meds  Medication Sig   acetaminophen (TYLENOL) 500 MG tablet Take 2 tablets (1,000 mg total) by mouth every 8 (eight) hours as needed. (Patient taking differently: Take 1,000 mg by mouth 2 (two) times  daily.)   amLODipine (NORVASC) 10 MG tablet Take 1 tablet (10 mg total) by mouth daily.   aspirin EC 81 MG tablet Take 1 tablet (81 mg total) by mouth daily. Swallow whole.   clobetasol cream (TEMOVATE) 1.61 % Apply 1 application topically 2 (two) times daily.   dapagliflozin propanediol (FARXIGA) 10 MG TABS tablet Take 1 tablet (10 mg total) by mouth daily.   insulin degludec (TRESIBA FLEXTOUCH) 100 UNIT/ML SOPN FlexTouch Pen Inject 0.1 mLs (10 Units total) into the skin daily after supper.   Insulin Pen Needle (PEN NEEDLES 5/16") 30G X 8 MM MISC Pen needles used for lantus injections   lipase/protease/amylase (CREON) 36000 UNITS CPEP capsule Take 2 capsules with each meal three times daily  Take 1 capsule with each snack twice daily (Patient taking differently: Take 36,000-72,000 Units by mouth See admin instructions. Take 2 capsules with each meal three times daily  Take 1 capsule with each snack twice daily)   losartan (COZAAR) 25 MG tablet TAKE 1 TABLET BY MOUTH EVERY DAY   metFORMIN (GLUCOPHAGE) 1000 MG tablet TAKE 1 TABLET BY MOUTH TWICE DAILY WITH A MEAL   Metoprolol Tartrate 37.5 MG TABS TAKE 1 TABLET BY MOUTH TWICE A DAY   omeprazole (PRILOSEC) 40 MG capsule TAKE 1 CAPSULE BY MOUTH EVERY DAY   OneTouch Delica Lancets 09U MISC USE TO TEST BLOOD SUGAR ONCE A DAY   ONETOUCH VERIO test strip USE 1 STRIP TO CHECK GLUCOSE ONCE DAILY     Allergies:   Lisinopril   Social History   Tobacco Use   Smoking status: Former Smoker    Packs/day: 1.00    Years: 45.00    Pack years: 45.00    Quit date: 07/27/2005    Years since quitting: 14.7   Smokeless tobacco: Former Systems developer    Quit date: 03/2005  Vaping Use   Vaping Use: Never used  Substance Use Topics   Alcohol use: Yes    Comment: beer occasionally    Drug use: No     Family History: The patient's family history includes Cancer in her sister; Heart disease in her father; Tongue cancer in her sister. There is no  history of Colon polyps, Rectal cancer, or Stomach cancer.  ROS:   Please see the history of present illness.    All other systems reviewed and are negative.  EKGs/Labs/Other Studies Reviewed:    The following studies were reviewed today:  EKG:  NSR, inferior and anterior infarct pattern.   Recent Labs: 09/03/2019: Magnesium 2.0 02/02/2020: ALT 12; BUN 9; Creatinine, Ser 0.70; Hemoglobin 14.6; Platelets 359; Potassium 3.9; Sodium 142; TSH 1.380  Recent Lipid Panel    Component Value Date/Time   CHOL 127 02/09/2020 0852   TRIG 104 02/09/2020 0852   HDL 35 (L) 02/09/2020 0852   CHOLHDL 3.6 02/09/2020 0852   CHOLHDL 4.4 08/29/2016 0920   VLDL 28 08/29/2016 0920   LDLCALC 73 02/09/2020 0852   LDLDIRECT 81 03/21/2013 1431    Physical Exam:    VS:  BP 116/62 (BP Location: Left Arm, Patient Position: Sitting, Cuff Size: Normal)    Pulse 70    Ht _0  (1.6 m)    Wt 168 lb (76.2 kg)    BMI 29.76 kg/m     Wt Readings from Last 5 Encounters:  05/04/20 168 lb (76.2 kg)  02/04/20 165 lb 2 oz (74.9 kg)  02/02/20 162 lb (73.5 kg)  01/12/20 166 lb 3.2 oz (75.4 kg)  12/26/19 166 lb (75.3 kg)    Constitutional: No acute distress Eyes: sclera non-icteric, normal conjunctiva and lids ENMT: normal dentition, moist mucous membranes Cardiovascular: regular rhythm, normal rate, no murmurs. S1 and S2 normal. Radial pulses normal bilaterally. No jugular venous distention.  Respiratory: clear to auscultation bilaterally GI : normal bowel sounds, soft and nontender. No distention.   MSK: extremities warm, well perfused. No edema.  NEURO: grossly nonfocal exam, moves all extremities. PSYCH: alert and oriented x 3, normal mood and affect.   ASSESSMENT:    1. Essential hypertension   2. Hyperlipidemia associated with type 2 diabetes mellitus ([place])   3. Type 2 diabetes mellitus with hyperglycemia, with long-term current use of insulin ([place])   4. Coronary artery disease involving native heart  without angina pectoris, unspecified vessel or lesion type   5. S/P CABG x 3   6. Former smoker    PLAN:    Essential hypertension - Plan: EKG 12-Lead - amlodipine, losartan, metoprolol tartrate, continue  Hyperlipidemia associated with type 2 diabetes mellitus ([place]) - Plan: Lipid panel, Comprehensive metabolic panel - will repeat lipids on crestor 10 mg   Type 2 diabetes mellitus with hyperglycemia, with long-term current use of insulin ([facility]) - she is on Farxiga, insulin, metformin per PCP  Coronary artery disease involving native heart without angina pectoris, unspecified vessel or lesion type S/P CABG x 3 - continue asa, statin, bb, ARB   Total time of encounter: 30 minutes total time of encounter, including 20 minutes spent in face-to-face patient care on the date of this encounter. This time includes coordination of care and counseling regarding above mentioned problem list. Remainder of non-face-to-face time involved reviewing chart documents/testing relevant to the patient encounter and documentation in the medical record. I have independently reviewed documentation from referring provider.   Cherlynn Kaiser, MD, [facility]    Medication Adjustments/Labs and Tests Ordered: Current medicines are reviewed at length with the patient today.  Concerns regarding medicines are outlined above.   Orders Placed This Encounter  Procedures   Lipid panel   Comprehensive metabolic panel   EKG 40-HKVQ    No orders of the defined types were placed in this encounter.   Patient Instructions  Medication Instructions:  No Changes In Medications at this time.  *If you need a refill on your cardiac medications before your next appointment, please call your pharmacy*  Lab Work: LIPIDS/CMP- PLEASE RETURN TO OFFICE TO HAVE THIS DRAWN. YOU WILL NEED TO BE FASTING. NOTHING TO EAT OR DRINK AFTER MIDNIGHT THE NIGHT BEFORE  If you have labs (blood work) drawn today  and your tests are completely normal, you will receive your results only by:  [place] (if you have MyChart) OR  A paper copy in the mail If you have any lab test that is abnormal or we need to change your treatment, we will call you to review the results.  Follow-Up: At [facility], you and your health needs are our priority.  As part  of our continuing mission to provide you with exceptional heart care, we have created designated Provider Care Teams.  These Care Teams include your primary Cardiologist (physician) and Advanced Practice Providers (APPs -  Physician Assistants and Nurse Practitioners) who all work together to provide you with the care you need, when you need it.  We recommend signing up for the patient portal called "MyChart".  Sign up information is provided on this After Visit Summary.  MyChart is used to connect with patients for Virtual Visits (Telemedicine).  Patients are able to view lab/test results, encounter notes, upcoming appointments, etc.  Non-urgent messages can be sent to your provider as well.   To learn more about what you can do with MyChart, go to [email or web address].    Your next appointment:   6 month(s)  The format for your next appointment:   In Person  Provider:   Cherlynn Kaiser, MD

## 2020-05-07 ENCOUNTER — Other Ambulatory Visit: Payer: Self-pay | Admitting: Family Medicine

## 2020-05-08 ENCOUNTER — Other Ambulatory Visit: Payer: Self-pay | Admitting: Internal Medicine

## 2020-05-11 DIAGNOSIS — E1169 Type 2 diabetes mellitus with other specified complication: Secondary | ICD-10-CM | POA: Diagnosis not present

## 2020-05-11 DIAGNOSIS — E785 Hyperlipidemia, unspecified: Secondary | ICD-10-CM | POA: Diagnosis not present

## 2020-05-11 LAB — COMPREHENSIVE METABOLIC PANEL
ALT: 13 IU/L (ref 0–32)
AST: 17 IU/L (ref 0–40)
Albumin/Globulin Ratio: 1.4 (ref 1.2–2.2)
Albumin: 4.3 g/dL (ref 3.7–4.7)
Alkaline Phosphatase: 125 IU/L — ABNORMAL HIGH (ref 44–121)
BUN/Creatinine Ratio: 13 (ref 12–28)
BUN: 10 mg/dL (ref 8–27)
Bilirubin Total: 0.5 mg/dL (ref 0.0–1.2)
CO2: 21 mmol/L (ref 20–29)
Calcium: 9.6 mg/dL (ref 8.7–10.3)
Chloride: 100 mmol/L (ref 96–106)
Creatinine, Ser: 0.77 mg/dL (ref 0.57–1.00)
Globulin, Total: 3 g/dL (ref 1.5–4.5)
Glucose: 113 mg/dL — ABNORMAL HIGH (ref 65–99)
Potassium: 3.8 mmol/L (ref 3.5–5.2)
Sodium: 142 mmol/L (ref 134–144)
Total Protein: 7.3 g/dL (ref 6.0–8.5)
eGFR: 79 mL/min/{1.73_m2} (ref >59)

## 2020-05-11 LAB — LIPID PANEL
Chol/HDL Ratio: 3.2 ratio (ref 0.0–4.4)
Cholesterol, Total: 129 mg/dL (ref 100–199)
HDL: 40 mg/dL (ref >39)
LDL Chol Calc (NIH): 65 mg/dL (ref 0–99)
Triglycerides: 133 mg/dL (ref 0–149)
VLDL Cholesterol Cal: 24 mg/dL (ref 5–40)

## 2020-05-13 ENCOUNTER — Encounter: Payer: Self-pay | Admitting: Gastroenterology

## 2020-05-13 ENCOUNTER — Ambulatory Visit: Payer: Medicare HMO | Admitting: Gastroenterology

## 2020-05-13 VITALS — BP 152/70 | HR 96 | Ht 63.0 in | Wt 168.1 lb

## 2020-05-13 DIAGNOSIS — M25462 Effusion, left knee: Secondary | ICD-10-CM

## 2020-05-13 DIAGNOSIS — K580 Irritable bowel syndrome with diarrhea: Secondary | ICD-10-CM | POA: Diagnosis not present

## 2020-05-13 DIAGNOSIS — M25562 Pain in left knee: Secondary | ICD-10-CM

## 2020-05-13 DIAGNOSIS — K8689 Other specified diseases of pancreas: Secondary | ICD-10-CM

## 2020-05-13 MED ORDER — PANCRELIPASE (LIP-PROT-AMYL) 36000-114000 UNITS PO CPEP: ORAL_CAPSULE | ORAL | 11 refills | Status: DC

## 2020-05-13 NOTE — Progress Notes (Signed)
Jessica Hart    [id number]    June 06, 1940  Primary Care Physician:Henson, Laurian Brim, NP-C  Referring Physician: Girtha Rm, NP-C [place],  [place] [address]   Chief complaint: IBS-Chronic diarrhea  HPI:  80 year old very pleasant female here for follow-up visit for chronic IBS diarrhea and pancreatic insufficiency  She is taking Creon 72,000 units with meals and 36,000 units with snacks.  She continues to have increased bowel frequency with 5-6 bowel movements per day.  Mostly soft to semiformed stool and occasional liquid diarrhea.  Denies any mucus or rectal bleeding. No unintentional weight loss.  She has gained 8 pounds in the past 2 months, she feels she has made some wrong food choices.  She has been eating ice cream daily and has also been eating [place] sausage and bacon for breakfast on most days.  Denies any nausea, vomiting, abdominal pain, melena or bright red blood per rectum  Review of system positive for left knee pain and swelling, she has not been able to exercise or walk much.   GI work up: October 2019 stool GI pathogen panel negative for Infectious etiology  Colonoscopy January 01, 2018 with biopsies negative for microscopic colitis  December 07, 2017 fecal elastase 124 suggestive of pancreatic insufficiency   Outpatient Encounter Medications as of 05/13/2020  Medication Sig  . acetaminophen (TYLENOL) 500 MG tablet Take 2 tablets (1,000 mg total) by mouth every 8 (eight) hours as needed. (Patient taking differently: Take 1,000 mg by mouth 2 (two) times daily.)  . amLODipine (NORVASC) 10 MG tablet Take 1 tablet (10 mg total) by mouth daily.  Marland Kitchen aspirin EC 81 MG tablet Take 1 tablet (81 mg total) by mouth daily. Swallow whole.  . clobetasol cream (TEMOVATE) 1.42 % Apply 1 application topically 2 (two) times daily.  . dapagliflozin propanediol (FARXIGA) 10 MG TABS tablet Take 1 tablet (10 mg total) by mouth daily.  .  insulin degludec (TRESIBA FLEXTOUCH) 100 UNIT/ML SOPN FlexTouch Pen Inject 0.1 mLs (10 Units total) into the skin daily after supper.  . Insulin Pen Needle (PEN NEEDLES 5/16") 30G X 8 MM MISC Pen needles used for lantus injections  . lipase/protease/amylase (CREON) 36000 UNITS CPEP capsule Take 2 capsules with each meal three times daily  Take 1 capsule with each snack twice daily (Patient taking differently: Take 36,000-72,000 Units by mouth See admin instructions. Take 2 capsules with each meal three times daily  Take 1 capsule with each snack twice daily)  . losartan (COZAAR) 25 MG tablet TAKE 1 TABLET BY MOUTH EVERY DAY  . metFORMIN (GLUCOPHAGE) 1000 MG tablet TAKE 1 TABLET BY MOUTH TWICE DAILY WITH A MEAL  . Metoprolol Tartrate 37.5 MG TABS TAKE 1 TABLET BY MOUTH TWICE A DAY  . omeprazole (PRILOSEC) 40 MG capsule TAKE 1 CAPSULE BY MOUTH EVERY DAY  . OneTouch Delica Lancets 32W MISC USE TO TEST BLOOD SUGAR ONCE A DAY  . ONETOUCH VERIO test strip USE 1 STRIP TO CHECK GLUCOSE ONCE DAILY  . rosuvastatin (CRESTOR) 10 MG tablet Take 1 tablet (10 mg total) by mouth daily.   No facility-administered encounter medications on file as of 05/13/2020.    Allergies as of 05/13/2020 - Review Complete 05/13/2020  Allergen Reaction Noted  . Lisinopril Itching and Cough 05/26/2009    Past Medical History:  Diagnosis Date  . Arthritis   . Cataract bilaterl 3 years ago  . Coronary artery disease   .  Diabetes mellitus 02/27/2005  . Diabetes mellitus type 2, controlled, without complications ([facility]) 1/85/6314   Qualifier: Diagnosis of  By: Drucie Ip    . Diverticulosis   . Dyspnea   . Echocardiogram findings abnormal, without diagnosis 2005   EF 47%, no ischemia, no infarction  . Former smoker 01/10/2017   45 pack year history, quit in 2007   . GERD (gastroesophageal reflux disease)   . Heart murmur   . History of bone density study 2006   Lt hip = -1.0, L spine = -1.8   . Hyperlipidemia   .  Hypertension     Past Surgical History:  Procedure Laterality Date  . ABDOMINAL HYSTERECTOMY  1989   one ovary remains per patient  . CARDIAC CATHETERIZATION  08/20/2019  . COLONOSCOPY    . CORONARY ARTERY BYPASS GRAFT N/A 09/02/2019   Procedure: CORONARY ARTERY BYPASS GRAFTING (CABG) TIMES THREE USING LEFT INTERNAL MAMMARY ARTERY AND RIGHT GREATER SAPHENOUS VEIN;  Surgeon: Lajuana Matte, MD;  Location: [facility];  Service: Open Heart Surgery;  Laterality: N/A;  . ENDOVEIN HARVEST OF GREATER SAPHENOUS VEIN Right 09/02/2019   Procedure: ENDOVEIN HARVEST OF GREATER SAPHENOUS VEIN;  Surgeon: Lajuana Matte, MD;  Location: [facility];  Service: Open Heart Surgery;  Laterality: Right;  . EYE SURGERY Bilateral    CATARACT  . LEFT HEART CATH AND CORONARY ANGIOGRAPHY N/A 08/20/2019   Procedure: LEFT HEART CATH AND CORONARY ANGIOGRAPHY;  Surgeon: Belva Crome, MD;  Location: [facility];  Service: Cardiovascular;  Laterality: N/A;  . TEE WITHOUT CARDIOVERSION N/A 09/02/2019   Procedure: TRANSESOPHAGEAL ECHOCARDIOGRAM (TEE);  Surgeon: Lajuana Matte, MD;  Location: [facility];  Service: Open Heart Surgery;  Laterality: N/A;  . TONSILECTOMY, ADENOIDECTOMY, BILATERAL MYRINGOTOMY AND TUBES  1965  . TONSILLECTOMY      Family History  Problem Relation Age of Onset  . Heart disease Father   . Cancer Sister   . Tongue cancer Sister   . Colon polyps Neg Hx   . Rectal cancer Neg Hx   . Stomach cancer Neg Hx     Social History   Socioeconomic History  . Marital status: Married    Spouse name: Richard  . Number of children: 1  . Years of education: 67  . Highest education level: High school graduate  Occupational History  . Occupation: Retire-Textile    Employer: RETIRED  Tobacco Use  . Smoking status: Former Smoker    Packs/day: 1.00    Years: 45.00    Pack years: 45.00    Quit date: 07/27/2005    Years since quitting: 14.8  . Smokeless tobacco: Former Systems developer    Quit date: 03/2005   Vaping Use  . Vaping Use: Never used  Substance and Sexual Activity  . Alcohol use: Yes    Comment: beer occasionally   . Drug use: No  . Sexual activity: Not Currently    Partners: Male    Comment: 1st intercourse-22, partners- 77, married- 68 yrs   Other Topics Concern  . Not on file  Social History Narrative   Health Care POA:    Emergency Contact: husband, Ondrea Dow   End of Life Plan:    Who lives with you: Lives with husband   Any pets: gold fish   Diet: Patient has a varied diet and reports stuggeling with portion size and ice cream.   Exercise: Patient does not have currently exercise routine.   Seatbelts: Patient reports wearing  seatbelt when in vehicle.   Sun Exposure/Protection: Patient reports not using sun protection.   Hobbies: Bingo         Social Determinants of Radio broadcast assistant Strain: Not on file  Food Insecurity: Not on file  Transportation Needs: Not on file  Physical Activity: Not on file  Stress: Not on file  Social Connections: Not on file  Intimate Partner Violence: Not on file      Review of systems: All other review of systems negative except as mentioned in the HPI.   Physical Exam: Vitals:   05/13/20 0958  BP: (!) 152/70  Pulse: 96   Body mass index is 29.78 kg/m. Gen:      No acute distress HEENT:  sclera anicteric Abd:      soft, non-tender; no palpable masses, no distension Ext:    Mild bilateral 1+ edema Neuro: alert and oriented x 3 Psych: normal mood and affect  Data Reviewed:  Reviewed labs, radiology imaging, old records and pertinent past GI work up   Assessment and Plan/Recommendations:  80 year old very pleasant female with history of type 2 diabetes, hypertension, CAD, chronic IBS diarrhea and pancreatic insufficiency  Advised patient to decrease or limit lactose intake, trial of lactose-free diet Low-sodium diet, avoid eating bacon or [place] sausage every day  Continue Creon 72,000 units  with meals and 36,000 units with snacks  Will refer to sports medicine Dr. Tamala Julian for evaluation of left knee pain and swelling  Return in 1 year or sooner if needed   The patient was provided an opportunity to ask questions and all were answered. The patient agreed with the plan and demonstrated an understanding of the instructions.  Damaris Hippo , MD    CC: Girtha Rm, NP-C

## 2020-05-13 NOTE — Patient Instructions (Addendum)
We have referred you to Drusilla Kanner from [facility], they will contact you with that appointment  We will refill your creon for 1 year  Follow a Lactose free diet   Follow up in 1 year   Lactose-Free Diet, Adult If you have lactose intolerance, you are not able to digest lactose. Lactose is a natural sugar found mainly in dairy milk and dairy products. You may need to avoid all foods and beverages that contain lactose. A lactose-free diet can help you do this. Which foods have lactose? Lactose is found in dairy milk and dairy products, such as:  Yogurt.  Cheese.  Butter.  Margarine.  Sour cream.  Cream.  Whipped toppings and nondairy creamers.  Ice cream and other dairy-based desserts. Lactose is also found in foods or products made with dairy milk or milk ingredients. To find out whether a food contains dairy milk or a milk ingredient, look at the ingredients list. Avoid foods with the statement "May contain milk" and foods that contain:  Milk powder.  Whey.  Curd.  Caseinate.  Lactose.  Lactalbumin.  Lactoglobulin. What are alternatives to dairy milk and foods made with milk products?  Lactose-free milk.  Soy milk with added calcium and vitamin D.  Almond milk, coconut milk, rice milk, or other nondairy milk alternatives with added calcium and vitamin D. Note that these are low in protein.  Soy products, such as soy yogurt, soy cheese, soy ice cream, and soy-based sour cream.  Other nut milk products, such as almond yogurt, almond cheese, cashew yogurt, cashew cheese, cashew ice cream, coconut yogurt, and coconut ice cream. What are tips for following this plan?  Do not consume foods, beverages, vitamins, minerals, or medicines containing lactose. Read ingredient lists carefully.  Look for the words "lactose-free" on labels.  Use lactase enzyme drops or tablets as directed by your health care provider.  Use lactose-free milk or a milk  alternative, such as soy milk or almond milk, for drinking and cooking.  Make sure you get enough calcium and vitamin D in your diet. A lactose-free eating plan can be lacking in these important nutrients.  Take calcium and vitamin D supplements as directed by your health care provider. Talk to your health care provider about supplements if you are not able to get enough calcium and vitamin D from food. What foods can I eat? Fruits All fresh, canned, frozen, or dried fruits that are not processed with lactose. Vegetables All fresh, frozen, and canned vegetables without cheese, cream, or butter sauces. Grains Any that are not made with dairy milk or dairy products. Meats and other proteins Any meat, fish, poultry, and other protein sources that are not made with dairy milk or dairy products. Soy cheese and yogurt. Fats and oils Any that are not made with dairy milk or dairy products. Beverages Lactose-free milk. Soy, rice, or almond milk with added calcium and vitamin D. Fruit and vegetable juices. Sweets and desserts Any that are not made with dairy milk or dairy products. Seasonings and condiments Any that are not made with dairy milk or dairy products. Calcium Calcium is found in many foods that contain lactose and is important for bone health. The amount of calcium you need depends on your age:  Adults younger than 50 years: 1,000 mg of calcium a day.  Adults older than 50 years: 1,200 mg of calcium a day. If you are not getting enough calcium, you may get it from other sources, including:  Orange juice with calcium added. There are 300-350 mg of calcium in 1 cup of orange juice.  Calcium-fortified soy milk. There are 300-400 mg of calcium in 1 cup of calcium-fortified soy milk.  Calcium-fortified rice or almond milk. There are 300 mg of calcium in 1 cup of calcium-fortified rice or almond milk.  Calcium-fortified breakfast cereals. There are 100-1,000 mg of calcium in  calcium-fortified breakfast cereals.  Spinach, cooked. There are 145 mg of calcium in  cup of cooked spinach.  Edamame, cooked. There are 130 mg of calcium in  cup of cooked edamame.  Collard greens, cooked. There are 125 mg of calcium in  cup of cooked collard greens.  Kale, frozen or cooked. There are 90 mg of calcium in  cup of cooked or frozen kale.  Almonds. There are 95 mg of calcium in  cup of almonds.  Broccoli, cooked. There are 60 mg of calcium in 1 cup of cooked broccoli. The items listed above may not be a complete list of recommended foods and beverages. Contact a dietitian for more options.   What foods are not recommended? Fruits None, unless they are made with dairy milk or dairy products. Vegetables None, unless they are made with dairy milk or dairy products. Grains Any grains that are made with dairy milk or dairy products. Meats and other proteins None, unless they are made with dairy milk or dairy products. Dairy All dairy products, including milk, goat's milk, buttermilk, kefir, acidophilus milk, flavored milk, evaporated milk, condensed milk, dulce de Banner, eggnog, yogurt, cheese, and cheese spreads. Fats and oils Any that are made with milk or milk products. Margarines and salad dressings that contain milk or cheese. Cream. Half and half. Cream cheese. Sour cream. Chip dips made with sour cream or yogurt. Beverages Hot chocolate. Cocoa with lactose. Instant iced teas. Powdered fruit drinks. Smoothies made with dairy milk or yogurt. Sweets and desserts Any that are made with milk or milk products. Seasonings and condiments Chewing gum that has lactose. Spice blends if they contain lactose. Artificial sweeteners that contain lactose. Nondairy creamers. The items listed above may not be a complete list of foods and beverages to avoid. Contact a dietitian for more information. Summary  If you are lactose intolerant, it means that you have a hard time  digesting lactose, a natural sugar found in milk and milk products.  Following a lactose-free diet can help you manage this condition.  Calcium is important for bone health and is found in many foods that contain lactose. Talk with your health care provider about other sources of calcium. This information is not intended to replace advice given to you by your health care provider. Make sure you discuss any questions you have with your health care provider. Document Revised: 03/13/2017 Document Reviewed: 03/13/2017 Elsevier Patient Education  2021 [employer].  If you are age 79 or older, your body mass index should be between 23-30. Your Body mass index is 29.78 kg/m. If this is out of the aforementioned range listed, please consider follow up with your Primary Care Provider.  If you are age 34 or younger, your body mass index should be between 19-25. Your Body mass index is 29.78 kg/m. If this is out of the aformentioned range listed, please consider follow up with your Primary Care Provider.    Due to recent changes in healthcare laws, you may see the results of your imaging and laboratory studies on MyChart before your provider has had a chance  to review them.  We understand that in some cases there may be results that are confusing or concerning to you. Not all laboratory results come back in the same time frame and the provider may be waiting for multiple results in order to interpret others.  Please give [place] 48 hours in order for your provider to thoroughly review all the results before contacting the office for clarification of your results.   I appreciate the  opportunity to care for you  Thank You   Harl Bowie , MD

## 2020-05-17 ENCOUNTER — Telehealth: Payer: Self-pay

## 2020-05-17 DIAGNOSIS — E1169 Type 2 diabetes mellitus with other specified complication: Secondary | ICD-10-CM

## 2020-05-17 DIAGNOSIS — E785 Hyperlipidemia, unspecified: Secondary | ICD-10-CM

## 2020-05-17 MED ORDER — ROSUVASTATIN CALCIUM 20 MG PO TABS: 20.0000 mg | ORAL_TABLET | Freq: Every day | ORAL | 3 refills | Status: DC

## 2020-05-17 NOTE — Telephone Encounter (Signed)
Called and spoke w/pt results given and instructed to increase rosuvastatin to 67m so take 2 tablets to finish what they have in the bottle at home prior to starting the new rx tht I sent in. I also instructed the pt that they need fasting lipids in 2 mos after starting new dose. Pt voiced understanding

## 2020-05-17 NOTE — Addendum Note (Signed)
Addended by: Allean Found on: 05/17/2020 08:58 AM   Modules accepted: Orders

## 2020-05-28 NOTE — Progress Notes (Signed)
[facility] [address] [place] Phone: [phone] Subjective:   I Jessica Hart am serving as a Education administrator for Dr. Hulan Saas.  This visit occurred during the SARS-CoV-2 public health emergency.  Safety protocols were in place, including screening questions prior to the visit, additional usage of staff PPE, and extensive cleaning of exam room while observing appropriate contact time as indicated for disinfecting solutions.   I'm seeing this patient by the request  of:  Hart, Jessica L, NP-C  CC: left knee pain and swelling   [id number]  Jessica Hart is a 80 y.o. female coming in with complaint of left knee pain. Patient states the pain is chronic. States she has had injections before in the past and states it helps. Has arthritis in the knee. Pain is medial. Has tried multiple modalities over the years. States that exercising is difficult. Recent triple bypass. Some swelling at times. 5-6/10. knee feels sore and [place].   B knee xray 2015- mild medial OA bilateral.      Past Medical History:  Diagnosis Date  . Arthritis   . Cataract bilaterl 3 years ago  . Coronary artery disease   . Diabetes mellitus 02/27/2005  . Diabetes mellitus type 2, controlled, without complications ([facility]) 06/11/4358   Qualifier: Diagnosis of  By: Drucie Ip    . Diverticulosis   . Dyspnea   . Echocardiogram findings abnormal, without diagnosis 2005   EF 47%, no ischemia, no infarction  . Former smoker 01/10/2017   45 pack year history, quit in 2007   . GERD (gastroesophageal reflux disease)   . Heart murmur   . History of bone density study 2006   Lt hip = -1.0, L spine = -1.8   . Hyperlipidemia   . Hypertension    Past Surgical History:  Procedure Laterality Date  . ABDOMINAL HYSTERECTOMY  1989   one ovary remains per patient  . CARDIAC CATHETERIZATION  08/20/2019  . COLONOSCOPY    . CORONARY ARTERY BYPASS GRAFT N/A 09/02/2019    Procedure: CORONARY ARTERY BYPASS GRAFTING (CABG) TIMES THREE USING LEFT INTERNAL MAMMARY ARTERY AND RIGHT GREATER SAPHENOUS VEIN;  Surgeon: Lajuana Matte, MD;  Location: [facility];  Service: Open Heart Surgery;  Laterality: N/A;  . ENDOVEIN HARVEST OF GREATER SAPHENOUS VEIN Right 09/02/2019   Procedure: ENDOVEIN HARVEST OF GREATER SAPHENOUS VEIN;  Surgeon: Lajuana Matte, MD;  Location: [facility];  Service: Open Heart Surgery;  Laterality: Right;  . EYE SURGERY Bilateral    CATARACT  . LEFT HEART CATH AND CORONARY ANGIOGRAPHY N/A 08/20/2019   Procedure: LEFT HEART CATH AND CORONARY ANGIOGRAPHY;  Surgeon: Belva Crome, MD;  Location: [facility];  Service: Cardiovascular;  Laterality: N/A;  . TEE WITHOUT CARDIOVERSION N/A 09/02/2019   Procedure: TRANSESOPHAGEAL ECHOCARDIOGRAM (TEE);  Surgeon: Lajuana Matte, MD;  Location: [facility];  Service: Open Heart Surgery;  Laterality: N/A;  . TONSILECTOMY, ADENOIDECTOMY, BILATERAL MYRINGOTOMY AND TUBES  1965  . TONSILLECTOMY     Social History   Socioeconomic History  . Marital status: Married    Spouse name: Jessica Hart  . Number of children: 1  . Years of education: 88  . Highest education level: High school graduate  Occupational History  . Occupation: Retire-Textile    Employer: RETIRED  Tobacco Use  . Smoking status: Former Smoker    Packs/day: 1.00    Years: 45.00    Pack years: 45.00  Quit date: 07/27/2005    Years since quitting: 14.8  . Smokeless tobacco: Former Systems developer    Quit date: 03/2005  Vaping Use  . Vaping Use: Never used  Substance and Sexual Activity  . Alcohol use: Yes    Comment: beer occasionally   . Drug use: No  . Sexual activity: Not Currently    Partners: Male    Comment: 1st intercourse-22, partners- 31, married- 35 yrs   Other Topics Concern  . Not on file  Social History Narrative   Health Care POA:    Emergency Contact: husband, Jessica Hart   End of Life Plan:    Who lives with you: Lives  with husband   Any pets: gold fish   Diet: Patient has a varied diet and reports stuggeling with portion size and ice cream.   Exercise: Patient does not have currently exercise routine.   Seatbelts: Patient reports wearing seatbelt when in vehicle.   Sun Exposure/Protection: Patient reports not using sun protection.   Hobbies: Bingo         Social Determinants of Health   Financial Resource Strain: Not on file  Food Insecurity: Not on file  Transportation Needs: Not on file  Physical Activity: Not on file  Stress: Not on file  Social Connections: Not on file   Allergies  Allergen Reactions  . Lisinopril Itching and Cough   Family History  Problem Relation Age of Onset  . Heart disease Father   . Cancer Sister   . Tongue cancer Sister   . Colon polyps Neg Hx   . Rectal cancer Neg Hx   . Stomach cancer Neg Hx     Current Outpatient Medications (Endocrine & Metabolic):  .  dapagliflozin propanediol (FARXIGA) 10 MG TABS tablet, Take 1 tablet (10 mg total) by mouth daily. .  insulin degludec (TRESIBA FLEXTOUCH) 100 UNIT/ML SOPN FlexTouch Pen, Inject 0.1 mLs (10 Units total) into the skin daily after supper. .  metFORMIN (GLUCOPHAGE) 1000 MG tablet, TAKE 1 TABLET BY MOUTH TWICE DAILY WITH A MEAL  Current Outpatient Medications (Cardiovascular):  .  amLODipine (NORVASC) 10 MG tablet, Take 1 tablet (10 mg total) by mouth daily. Marland Kitchen  losartan (COZAAR) 25 MG tablet, TAKE 1 TABLET BY MOUTH EVERY DAY .  Metoprolol Tartrate 37.5 MG TABS, TAKE 1 TABLET BY MOUTH TWICE A DAY .  rosuvastatin (CRESTOR) 20 MG tablet, Take 1 tablet (20 mg total) by mouth daily.   Current Outpatient Medications (Analgesics):  .  acetaminophen (TYLENOL) 500 MG tablet, Take 2 tablets (1,000 mg total) by mouth every 8 (eight) hours as needed. (Patient taking differently: Take 1,000 mg by mouth 2 (two) times daily.) .  aspirin EC 81 MG tablet, Take 1 tablet (81 mg total) by mouth daily. Swallow  whole.   Current Outpatient Medications (Other):  .  clobetasol cream (TEMOVATE) 1.94 %, Apply 1 application topically 2 (two) times daily. .  Insulin Pen Needle (PEN NEEDLES 5/16") 30G X 8 MM MISC, Pen needles used for lantus injections .  lipase/protease/amylase (CREON) 36000 UNITS CPEP capsule, Take 2 capsules with each meal three times daily  Take 1 capsule with each snack twice daily .  omeprazole (PRILOSEC) 40 MG capsule, TAKE 1 CAPSULE BY MOUTH EVERY DAY .  OneTouch Delica Lancets 17E MISC, USE TO TEST BLOOD SUGAR ONCE A DAY .  ONETOUCH VERIO test strip, USE 1 STRIP TO CHECK GLUCOSE ONCE DAILY   Reviewed prior external information including notes and imaging from  primary care provider As well as notes that were available from care everywhere and other healthcare systems.  Past medical history, social, surgical and family history all reviewed in electronic medical record.  No pertanent information unless stated regarding to the chief complaint.   Review of Systems:  No headache, visual changes, nausea, vomiting, diarrhea, constipation, dizziness, abdominal pain, skin rash, fevers, chills, night sweats, weight loss, swollen lymph nodes, , chest pain, shortness of breath, mood changes. POSITIVE muscle aches, body aches, joint swelling  Objective  Blood pressure (!) 144/9, pulse 77, height 5' 3" (1.6 m), weight 170 lb (77.1 kg), SpO2 94 %.   General: No apparent distress alert and oriented x3 mood and affect normal, dressed appropriately.  HEENT: Pupils equal, extraocular movements intact  Respiratory: Patient's speak in full sentences and does not appear short of breath  Cardiovascular: No lower extremity edema, non tender, no erythema  Gait normal with good balance and coordination.  MSK: Patient likely does have significant arthritic changes mostly of the medial compartment.  Mild valgus and varus deformity noted.  Trace effusion noted of the patellofemoral joint.  Mild crepitus  noted.  Full range of motion no noted.  After informed written and verbal consent, patient was seated on exam table. Left knee was prepped with alcohol swab and utilizing anterolateral approach, patient's left knee space was injected with 4:1  marcaine 0.5%: Kenalog 65m/dL. Patient tolerated the procedure well without immediate complications.    Impression and Recommendations:     The above documentation has been reviewed and is accurate and complete ZLyndal Pulley DO

## 2020-05-31 ENCOUNTER — Ambulatory Visit (INDEPENDENT_AMBULATORY_CARE_PROVIDER_SITE_OTHER): Payer: Medicare HMO

## 2020-05-31 ENCOUNTER — Other Ambulatory Visit: Payer: Self-pay | Admitting: Internal Medicine

## 2020-05-31 ENCOUNTER — Encounter: Payer: Self-pay | Admitting: Family Medicine

## 2020-05-31 ENCOUNTER — Other Ambulatory Visit: Payer: Self-pay

## 2020-05-31 ENCOUNTER — Other Ambulatory Visit: Payer: Self-pay | Admitting: Family Medicine

## 2020-05-31 ENCOUNTER — Ambulatory Visit: Payer: Medicare HMO | Admitting: Family Medicine

## 2020-05-31 DIAGNOSIS — M25562 Pain in left knee: Secondary | ICD-10-CM | POA: Diagnosis not present

## 2020-05-31 DIAGNOSIS — I10 Essential (primary) hypertension: Secondary | ICD-10-CM

## 2020-05-31 DIAGNOSIS — E1165 Type 2 diabetes mellitus with hyperglycemia: Secondary | ICD-10-CM

## 2020-05-31 DIAGNOSIS — K219 Gastro-esophageal reflux disease without esophagitis: Secondary | ICD-10-CM

## 2020-05-31 DIAGNOSIS — M1712 Unilateral primary osteoarthritis, left knee: Secondary | ICD-10-CM | POA: Diagnosis not present

## 2020-05-31 DIAGNOSIS — Z794 Long term (current) use of insulin: Secondary | ICD-10-CM

## 2020-05-31 NOTE — Assessment & Plan Note (Signed)
Patient given injection and tolerated the procedure well.  Discussed icing regimen and home exercises.  Discussed which activities to do which wants to avoid.  Patient can increase activity slowly.  We will get x-rays to further evaluate the amount of arthritic changes.  And follow-up if continuing to have trouble could be a candidate for viscosupplementation and could be a candidate also for a brace.  Follow-up with me again 6 weeks

## 2020-05-31 NOTE — Telephone Encounter (Signed)
Metoprolol was lower in dose, lovastatin was changed to another med

## 2020-05-31 NOTE — Patient Instructions (Addendum)
Good to see you Xray today Exercises Tart cherry extract 1263m at night Viti D 2000IU daily See me again in 6-8 weeks

## 2020-06-24 ENCOUNTER — Inpatient Hospital Stay: Admission: RE | Admit: 2020-06-24 | Payer: Medicare HMO | Source: Ambulatory Visit

## 2020-07-14 NOTE — Progress Notes (Signed)
[facility] [address] [place] Phone: [phone] Subjective:   I Jessica Hart am serving as a Education administrator for Dr. Hulan Saas.  This visit occurred during the SARS-CoV-2 public health emergency.  Safety protocols were in place, including screening questions prior to the visit, additional usage of staff PPE, and extensive cleaning of exam room while observing appropriate contact time as indicated for disinfecting solutions.   I'm seeing this patient by the request  of:  Harland Dingwall L, NP-C  CC: Knee pain follow-up  [id number]   05/31/2020 Patient given injection and tolerated the procedure well.  Discussed icing regimen and home exercises.  Discussed which activities to do which wants to avoid.  Patient can increase activity slowly.  We will get x-rays to further evaluate the amount of arthritic changes.  And follow-up if continuing to have trouble could be a candidate for viscosupplementation and could be a candidate also for a brace.  Follow-up with me again 6 weeks  Update 07/16/2020 Jessica Hart is a 80 y.o. female coming in with complaint of L knee pain. Patient states her knee is a little better. States the injection helped a little and is not sure if she wants another one today. States her leg feels weak.  Patient is hoping that she can get some improvement even more.  Would state that she is feeling about 60% better over the last 6 weeks from prior to this.  Xray left knee 05/31/2020 IMPRESSION: Mild to moderate tricompartmental osteoarthritis, most prominent in the medial compartment.     Past Medical History:  Diagnosis Date  . Arthritis   . Cataract bilaterl 3 years ago  . Coronary artery disease   . Diabetes mellitus 02/27/2005  . Diabetes mellitus type 2, controlled, without complications ([facility]) 6/76/7209   Qualifier: Diagnosis of  By: Drucie Ip    . Diverticulosis   . Dyspnea   . Echocardiogram findings  abnormal, without diagnosis 2005   EF 47%, no ischemia, no infarction  . Former smoker 01/10/2017   45 pack year history, quit in 2007   . GERD (gastroesophageal reflux disease)   . Heart murmur   . History of bone density study 2006   Lt hip = -1.0, L spine = -1.8   . Hyperlipidemia   . Hypertension    Past Surgical History:  Procedure Laterality Date  . ABDOMINAL HYSTERECTOMY  1989   one ovary remains per patient  . CARDIAC CATHETERIZATION  08/20/2019  . COLONOSCOPY    . 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 ENDOVEIN HARVEST OF GREATER SAPHENOUS VEIN Right 09/02/2019   Procedure: ENDOVEIN HARVEST OF GREATER SAPHENOUS VEIN;  Surgeon: Lajuana Matte, MD;  Location: [facility];  Service: Open Heart Surgery;  Laterality: Right;  . EYE SURGERY Bilateral    CATARACT  . LEFT HEART CATH AND CORONARY ANGIOGRAPHY N/A 08/20/2019   Procedure: LEFT HEART CATH AND CORONARY ANGIOGRAPHY;  Surgeon: Belva Crome, MD;  Location: [facility];  Service: Cardiovascular;  Laterality: N/A;  . TEE WITHOUT CARDIOVERSION N/A 09/02/2019   Procedure: TRANSESOPHAGEAL ECHOCARDIOGRAM (TEE);  Surgeon: Lajuana Matte, MD;  Location: [facility];  Service: Open Heart Surgery;  Laterality: N/A;  . TONSILECTOMY, ADENOIDECTOMY,  BILATERAL MYRINGOTOMY AND TUBES  1965  . TONSILLECTOMY     Social History   Socioeconomic History  . Marital status: Married    Spouse name: Richard  . Number of children: 1  . Years of education: 57  . Highest education level: High school graduate  Occupational History  . Occupation: Retire-Textile    Employer: RETIRED  Tobacco Use  . Smoking status: Former Smoker    Packs/day: 1.00    Years: 45.00    Pack years: 45.00    Quit date:  07/27/2005    Years since quitting: 14.9  . Smokeless tobacco: Former Systems developer    Quit date: 03/2005  Vaping Use  . Vaping Use: Never used  Substance and Sexual Activity  . Alcohol use: Yes    Comment: beer occasionally   . Drug use: No  . Sexual activity: Not Currently    Partners: Male    Comment: 1st intercourse-22, partners- 31, married- 46 yrs   Other Topics Concern  . Not on file  Social History Narrative   Health Care POA:    Emergency Contact: husband, Saylor Murry   End of Life Plan:    Who lives with you: Lives with husband   Any pets: gold fish   Diet: Patient has a varied diet and reports stuggeling with portion size and ice cream.   Exercise: Patient does not have currently exercise routine.   Seatbelts: Patient reports wearing seatbelt when in vehicle.   Sun Exposure/Protection: Patient reports not using sun protection.   Hobbies: Bingo         Social Determinants of Health   Financial Resource Strain: Not on file  Food Insecurity: Not on file  Transportation Needs: Not on file  Physical Activity: Not on file  Stress: Not on file  Social Connections: Not on file   Allergies  Allergen Reactions  . Lisinopril Itching and Cough   Family History  Problem Relation Age of Onset  . Heart disease Father   . Cancer Sister   . Tongue cancer Sister   . Colon polyps Neg Hx   . Rectal cancer Neg Hx   . Stomach cancer Neg Hx     Current Outpatient Medications (Endocrine & Metabolic):  .  dapagliflozin propanediol (FARXIGA) 10 MG TABS tablet, Take 1 tablet (10 mg total) by mouth daily. Marland Kitchen  FARXIGA 5 MG TABS tablet, TAKE 1 TABLET BY MOUTH EVERY DAY .  insulin degludec (TRESIBA FLEXTOUCH) 100 UNIT/ML SOPN FlexTouch Pen, Inject 0.1 mLs (10 Units total) into the skin daily after supper. .  metFORMIN (GLUCOPHAGE) 1000 MG tablet, TAKE 1 TABLET BY MOUTH TWICE DAILY WITH A MEAL  Current Outpatient Medications (Cardiovascular):  .  amLODipine (NORVASC) 10 MG tablet, Take  1 tablet (10 mg total) by mouth daily. Marland Kitchen  losartan (COZAAR) 25 MG tablet, TAKE 1 TABLET BY MOUTH EVERY DAY .  losartan (COZAAR) 50 MG tablet, TAKE 1 TABLET BY MOUTH EVERY DAY .  Metoprolol Tartrate 37.5 MG TABS, TAKE 1 TABLET BY MOUTH TWICE A DAY .  rosuvastatin (CRESTOR) 20 MG tablet, Take 1 tablet (20 mg total) by mouth daily.   Current Outpatient Medications (Analgesics):  .  acetaminophen (TYLENOL) 500 MG tablet, Take 2 tablets (1,000 mg total) by mouth every 8 (eight) hours as needed. (Patient taking differently: Take 1,000 mg by mouth 2 (two) times daily.) .  aspirin EC 81 MG tablet, Take 1 tablet (81 mg total) by mouth daily. Swallow whole.   Current  Outpatient Medications (Other):  .  clobetasol cream (TEMOVATE) 1.12 %, Apply 1 application topically 2 (two) times daily. .  Insulin Pen Needle (PEN NEEDLES 5/16") 30G X 8 MM MISC, Pen needles used for lantus injections .  lipase/protease/amylase (CREON) 36000 UNITS CPEP capsule, Take 2 capsules with each meal three times daily  Take 1 capsule with each snack twice daily .  omeprazole (PRILOSEC) 40 MG capsule, TAKE 1 CAPSULE BY MOUTH EVERY DAY .  OneTouch Delica Lancets 16K MISC, USE TO TEST BLOOD SUGAR ONCE A DAY .  ONETOUCH VERIO test strip, USE 1 STRIP TO CHECK GLUCOSE ONCE DAILY   Reviewed prior external information including notes and imaging from  primary care provider As well as notes that were available from care everywhere and other healthcare systems.  Past medical history, social, surgical and family history all reviewed in electronic medical record.  No pertanent information unless stated regarding to the chief complaint.   Review of Systems:  No headache, visual changes, nausea, vomiting, diarrhea, constipation, dizziness, abdominal pain, skin rash, fevers, chills, night sweats, weight loss, swollen lymph nodes, body aches,chest pain, shortness of breath, mood changes. POSITIVE muscle aches, joint swelling  Objective   Blood pressure 140/82, pulse 72, height 5' 3" (1.6 m), weight 169 lb (76.7 kg), SpO2 90 %.   General: No apparent distress alert and oriented x3 mood and affect normal, dressed appropriately.  HEENT: Pupils equal, extraocular movements intact  Respiratory: Patient's speak in full sentences and does not appear short of breath  Cardiovascular: No lower extremity edema, non tender, no erythema  Gait mild antalgic MSK: Left knee exam does still have trace effusion noted.  Patient lacks last 5 degrees of flexion.  Also mild instability with valgus and varus force.  Tender to palpation over the medial joint space.    Impression and Recommendations:     The above documentation has been reviewed and is accurate and complete Lyndal Pulley, DO

## 2020-07-16 ENCOUNTER — Other Ambulatory Visit: Payer: Self-pay

## 2020-07-16 ENCOUNTER — Encounter: Payer: Self-pay | Admitting: Family Medicine

## 2020-07-16 ENCOUNTER — Ambulatory Visit: Payer: Medicare HMO | Admitting: Family Medicine

## 2020-07-16 DIAGNOSIS — M1712 Unilateral primary osteoarthritis, left knee: Secondary | ICD-10-CM | POA: Diagnosis not present

## 2020-07-16 NOTE — Patient Instructions (Addendum)
Good to see you Gel approval  We will call you once you are approved [facility]

## 2020-07-16 NOTE — Assessment & Plan Note (Signed)
Patient responded very well to the steroid injection but I do think she is a candidate for viscosupplementation.  I do think that this would be another thing that could be beneficial.  Discussed with patient about icing regimen and home exercises.  We will see if we can get approval for the viscosupplementation and we will have her follow-up afterwards.

## 2020-08-02 ENCOUNTER — Encounter: Payer: Self-pay | Admitting: Family Medicine

## 2020-08-02 ENCOUNTER — Ambulatory Visit (INDEPENDENT_AMBULATORY_CARE_PROVIDER_SITE_OTHER): Payer: Medicare HMO | Admitting: Family Medicine

## 2020-08-02 ENCOUNTER — Other Ambulatory Visit: Payer: Self-pay

## 2020-08-02 VITALS — BP 140/80 | HR 84 | Wt 166.0 lb

## 2020-08-02 DIAGNOSIS — E785 Hyperlipidemia, unspecified: Secondary | ICD-10-CM

## 2020-08-02 DIAGNOSIS — E781 Pure hyperglyceridemia: Secondary | ICD-10-CM

## 2020-08-02 DIAGNOSIS — Z794 Long term (current) use of insulin: Secondary | ICD-10-CM

## 2020-08-02 DIAGNOSIS — E1165 Type 2 diabetes mellitus with hyperglycemia: Secondary | ICD-10-CM

## 2020-08-02 DIAGNOSIS — I7090 Unspecified atherosclerosis: Secondary | ICD-10-CM

## 2020-08-02 DIAGNOSIS — J439 Emphysema, unspecified: Secondary | ICD-10-CM

## 2020-08-02 DIAGNOSIS — I10 Essential (primary) hypertension: Secondary | ICD-10-CM

## 2020-08-02 DIAGNOSIS — E1169 Type 2 diabetes mellitus with other specified complication: Secondary | ICD-10-CM

## 2020-08-02 DIAGNOSIS — I2510 Atherosclerotic heart disease of native coronary artery without angina pectoris: Secondary | ICD-10-CM

## 2020-08-02 LAB — POCT GLYCOSYLATED HEMOGLOBIN (HGB A1C): Hemoglobin A1C: 6.5 % — AB (ref 4.0–5.6)

## 2020-08-02 MED ORDER — LOSARTAN POTASSIUM 50 MG PO TABS: 1.0000 | ORAL_TABLET | Freq: Every day | ORAL | 1 refills | Status: DC

## 2020-08-02 MED ORDER — AMLODIPINE BESYLATE 10 MG PO TABS: 10.0000 mg | ORAL_TABLET | Freq: Every day | ORAL | 1 refills | Status: DC

## 2020-08-02 MED ORDER — ROSUVASTATIN CALCIUM 20 MG PO TABS: 20.0000 mg | ORAL_TABLET | Freq: Every day | ORAL | 1 refills | Status: DC

## 2020-08-02 MED ORDER — DAPAGLIFLOZIN PROPANEDIOL 5 MG PO TABS: 5.0000 mg | ORAL_TABLET | Freq: Every day | ORAL | 1 refills | Status: DC

## 2020-08-02 NOTE — Patient Instructions (Signed)
Continue on your current medications and take the 5 mg of Farxiga (not the 10 mg) for now.   Try to do more low impact exercises since your knee is bothering you.   Biking, elliptical, rowing machine or water aerobics.   Follow up in 4 months or sooner if needed.

## 2020-08-02 NOTE — Progress Notes (Signed)
   Subjective:    Patient ID: Jessica Hart, female    DOB: 1940-07-10, 80 y.o.   MRN: [id number]  HPI Chief Complaint  Patient presents with  . 6 month diabetes    6 month follow-up med check- discuss crestor medicine to see when she should be taking it but needs a refills Discuss farixga as well does she need to be on farixga 60m or 128m Needs refill on amlopidine and losartan medication    She is here for a medication management visit.   She is having issues with her left knee. Seeing Dr. SmTamala Julianor this.  States she has not been active due to knee pain.   DM- checking BS at home daily. FBS 90-135 Metformin 1,000 and Farxiga 5 mg. She took 10 mg for 2 months and realized she should  Eating fairly healthy.   On Crestor and doing fine.   Hx of atherosclerosis and emphysema on CT.  Denies cough, shortness of breath.    Denies fever, chills, dizziness, chest pain, palpitations, abdominal pain, N/V/D, urinary symptoms, LE edema.     Review of Systems Pertinent positives and negatives in the history of present illness.     Objective:   Physical Exam BP 140/80   Pulse 84   Wt 166 lb (75.3 kg)   BMI 29.41 kg/m   Alert and in no distress.  Cardiac exam shows a regular rhythm.  Lungs are clear to auscultation. Extremities without edema. Skin is warm and dry. Normal speech, mood and memory.        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 Continue current medication,  Farxiga 5 mg and not 10 mg. Recommend low carb diet and increase physical activity (low impact).  Continue all medications including HTN and statin.  Review of low dose CT shows emphysema of lungs. She denies any symptoms and will follow up with repeat low dose CT as recommended.  I will see her back in 4 months for diabetes. She will continue seeing cardiology.

## 2020-08-04 ENCOUNTER — Other Ambulatory Visit: Payer: Self-pay | Admitting: Internal Medicine

## 2020-08-04 MED ORDER — TRESIBA FLEXTOUCH 100 UNIT/ML ~~LOC~~ SOPN: 10.0000 [IU] | PEN_INJECTOR | Freq: Every day | SUBCUTANEOUS | 0 refills | Status: DC

## 2020-08-12 ENCOUNTER — Other Ambulatory Visit: Payer: Self-pay

## 2020-08-12 ENCOUNTER — Ambulatory Visit
Admission: RE | Admit: 2020-08-12 | Discharge: 2020-08-12 | Disposition: A | Discharge status: Home / Self Care | Payer: Medicare HMO | Source: Ambulatory Visit | Attending: Family Medicine | Admitting: Family Medicine

## 2020-08-12 DIAGNOSIS — Z1231 Encounter for screening mammogram for malignant neoplasm of breast: Secondary | ICD-10-CM | POA: Diagnosis not present

## 2020-08-30 ENCOUNTER — Other Ambulatory Visit: Payer: Self-pay | Admitting: Family Medicine

## 2020-09-24 ENCOUNTER — Other Ambulatory Visit: Payer: Self-pay | Admitting: Internal Medicine

## 2020-09-28 ENCOUNTER — Other Ambulatory Visit: Payer: Self-pay | Admitting: Family Medicine

## 2020-09-28 DIAGNOSIS — K219 Gastro-esophageal reflux disease without esophagitis: Secondary | ICD-10-CM

## 2020-09-29 ENCOUNTER — Other Ambulatory Visit: Payer: Self-pay | Admitting: *Deleted

## 2020-09-29 MED ORDER — METOPROLOL TARTRATE 37.5 MG PO TABS: 1.0000 | ORAL_TABLET | Freq: Two times a day (BID) | ORAL | 3 refills | Status: DC

## 2020-11-04 ENCOUNTER — Encounter: Payer: Self-pay | Admitting: Internal Medicine

## 2020-11-04 ENCOUNTER — Ambulatory Visit: Payer: Medicare HMO | Admitting: Internal Medicine

## 2020-11-04 ENCOUNTER — Other Ambulatory Visit: Payer: Self-pay

## 2020-11-04 VITALS — BP 122/64 | HR 77 | Ht 63.0 in | Wt 162.0 lb

## 2020-11-04 DIAGNOSIS — I2510 Atherosclerotic heart disease of native coronary artery without angina pectoris: Secondary | ICD-10-CM

## 2020-11-04 DIAGNOSIS — Z794 Long term (current) use of insulin: Secondary | ICD-10-CM | POA: Diagnosis not present

## 2020-11-04 DIAGNOSIS — E1169 Type 2 diabetes mellitus with other specified complication: Secondary | ICD-10-CM

## 2020-11-04 DIAGNOSIS — E1165 Type 2 diabetes mellitus with hyperglycemia: Secondary | ICD-10-CM | POA: Diagnosis not present

## 2020-11-04 DIAGNOSIS — Z951 Presence of aortocoronary bypass graft: Secondary | ICD-10-CM | POA: Diagnosis not present

## 2020-11-04 DIAGNOSIS — I10 Essential (primary) hypertension: Secondary | ICD-10-CM | POA: Diagnosis not present

## 2020-11-04 DIAGNOSIS — E785 Hyperlipidemia, unspecified: Secondary | ICD-10-CM

## 2020-11-04 NOTE — Patient Instructions (Signed)
Medication Instructions:  No Changes In Medications at this time.  *If you need a refill on your cardiac medications before your next appointment, please call your pharmacy*  Follow-Up: At [facility], you and your health needs are our priority.  As part of our continuing mission to provide you with exceptional heart care, we have created designated Provider Care Teams.  These Care Teams include your primary Cardiologist (physician) and Advanced Practice Providers (APPs -  Physician Assistants and Nurse Practitioners) who all work together to provide you with the care you need, when you need it.  We recommend signing up for the patient portal called "MyChart".  Sign up information is provided on this After Visit Summary.  MyChart is used to connect with patients for Virtual Visits (Telemedicine).  Patients are able to view lab/test results, encounter notes, upcoming appointments, etc.  Non-urgent messages can be sent to your provider as well.   To learn more about what you can do with MyChart, go to [email or web address].    Your next appointment:   MARCH 28th at [facility]  The format for your next appointment:   In Person  Provider:   Cherlynn Kaiser, MD

## 2020-11-04 NOTE — Progress Notes (Signed)
Cardiology Office Note:    Date:  11/04/2020   ID:  Jessica Hart, DOB [birth year], MRN [id number]  PCP:  Girtha Rm, NP-C  Cardiologist:  Elouise Munroe, MD  Electrophysiologist:  None   Referring MD: Girtha Rm, NP-C   Chief Complaint/Reason for Referral: CAD s/p CABG  History of Present Illness:    Jessica Hart is a 80 y.o. female with a history of diabetes mellitus type 2, hypertension, hyperlipidemia, GERD, remote tobacco abuse who presents for follow-up after CABG x3 LIMA to LAD, reverse saphenous vein graft to PDA and OM1.  She was initially referred for coronary angiography given progressive chest pain and infarct seen on stress test inferior and inferolateral with nuclear stress EF of 36%.  Coronary angiography showed severe three-vessel coronary artery obstructive disease with severe three-vessel calcification.  Coronary artery bypass grafting performed on 09/02/2019.   An echo was performed on 08-29-19 demonstrating mild mitral valve regurgitation and estimated ejection fraction 40 to 45% with regional wall motion abnormalities. Most recent LVEF felt to be normal, 60-65%. At last visit echo reviewed with patient. Mild LVH, and I am unable to reproduce the readers 18 mm measurement. I have independently reviewed the images multiple times. I think strain was not performed well therefore cannot be accurately reported (may be falsely low values due to suboptimal tracking). Discussed in great detail with patient and we decided in the absence of concerning strain patterns, no syncope or signs of arrhythmia, there is no strong indication to screen for HCM or amyloid at this time. Will repeat an echo at next follow up since she is overall doing well.  She notes walking makes her feel better. When active she will have mild SOB but she feels better after moving later in the day. No CP. Occasional shooting left leg cramp noted at last visit which she still has. Reviewed that  this may be due to statin therapy and offered CVRR visit for consideration of PCSK9I. She defers at this time and would like to try topical therapies and walking more. Will consider Mg supplement but already has some increased bowel movements from metformin. She is overall tolerating crestor 20 mg daily well.     Past Medical History:  Diagnosis Date   Arthritis    Cataract bilaterl 3 years ago   Coronary artery disease    Diabetes mellitus 02/27/2005   Diabetes mellitus type 2, controlled, without complications ([place]) 05/12/1759   Qualifier: Diagnosis of  By: Drucie Ip     Diverticulosis    Dyspnea    Echocardiogram findings abnormal, without diagnosis 2005   EF 47%, no ischemia, no infarction   Former smoker 01/10/2017   45 pack year history, quit in 2007    GERD (gastroesophageal reflux disease)    Heart murmur    History of bone density study 2006   Lt hip = -1.0, L spine = -1.8    Hyperlipidemia    Hypertension     Past Surgical History:  Procedure Laterality Date   ABDOMINAL HYSTERECTOMY  1989   one ovary remains per patient   CARDIAC CATHETERIZATION  08/20/2019   COLONOSCOPY     CORONARY ARTERY BYPASS GRAFT N/A 09/02/2019   Procedure: CORONARY ARTERY BYPASS GRAFTING (CABG) TIMES THREE USING LEFT INTERNAL MAMMARY ARTERY AND RIGHT GREATER SAPHENOUS VEIN;  Surgeon: Lajuana Matte, MD;  Location: [place];  Service: Open Heart Surgery;  Laterality: N/A;   ENDOVEIN HARVEST OF GREATER SAPHENOUS VEIN Right  09/02/2019   Procedure: ENDOVEIN HARVEST OF GREATER SAPHENOUS VEIN;  Surgeon: Lajuana Matte, MD;  Location: [place];  Service: Open Heart Surgery;  Laterality: Right;   EYE SURGERY Bilateral    CATARACT   LEFT HEART CATH AND CORONARY ANGIOGRAPHY N/A 08/20/2019   Procedure: LEFT HEART CATH AND CORONARY ANGIOGRAPHY;  Surgeon: Belva Crome, MD;  Location: [facility];  Service: Cardiovascular;  Laterality: N/A;   TEE WITHOUT CARDIOVERSION N/A 09/02/2019   Procedure:  TRANSESOPHAGEAL ECHOCARDIOGRAM (TEE);  Surgeon: Lajuana Matte, MD;  Location: [place];  Service: Open Heart Surgery;  Laterality: N/A;   TONSILECTOMY, ADENOIDECTOMY, BILATERAL MYRINGOTOMY AND TUBES  1965   TONSILLECTOMY      Current Medications: Current Meds  Medication Sig   acetaminophen (TYLENOL) 500 MG tablet Take 2 tablets (1,000 mg total) by mouth every 8 (eight) hours as needed. (Patient taking differently: Take 1,000 mg by mouth 2 (two) times daily.)   amLODipine (NORVASC) 10 MG tablet Take 1 tablet (10 mg total) by mouth daily.   aspirin EC 81 MG tablet Take 1 tablet (81 mg total) by mouth daily. Swallow whole.   dapagliflozin propanediol (FARXIGA) 5 MG TABS tablet Take 1 tablet (5 mg total) by mouth daily.   insulin degludec (TRESIBA FLEXTOUCH) 100 UNIT/ML FlexTouch Pen Inject 10 Units into the skin daily after supper.   Insulin Pen Needle (PEN NEEDLES 5/16") 30G X 8 MM MISC Pen needles used for lantus injections   lipase/protease/amylase (CREON) 36000 UNITS CPEP capsule Take 2 capsules with each meal three times daily  Take 1 capsule with each snack twice daily   losartan (COZAAR) 50 MG tablet Take 1 tablet (50 mg total) by mouth daily.   metFORMIN (GLUCOPHAGE) 1000 MG tablet TAKE 1 TABLET BY MOUTH TWICE DAILY WITH A MEAL   Metoprolol Tartrate 37.5 MG TABS Take 1 tablet by mouth 2 (two) times daily.   omeprazole (PRILOSEC) 40 MG capsule TAKE 1 CAPSULE BY MOUTH EVERY DAY   OneTouch Delica Lancets 09X MISC USE TO TEST BLOOD SUGAR ONCE A DAY   ONETOUCH VERIO test strip USE 1 STRIP TO CHECK GLUCOSE ONCE DAILY   rosuvastatin (CRESTOR) 20 MG tablet Take 1 tablet (20 mg total) by mouth daily.     Allergies:   Lisinopril   Social History   Tobacco Use   Smoking status: Former    Packs/day: 1.00    Years: 45.00    Pack years: 45.00    Types: Cigarettes    Quit date: 07/27/2005    Years since quitting: 15.2   Smokeless tobacco: Former    Quit date: 03/2005  Vaping Use    Vaping Use: Never used  Substance Use Topics   Alcohol use: Yes    Comment: beer occasionally    Drug use: No     Family History: The patient's family history includes Cancer in her sister; Heart disease in her father; Tongue cancer in her sister. There is no history of Colon polyps, Rectal cancer, or Stomach cancer.  ROS:   Please see the history of present illness.    All other systems reviewed and are negative.  EKGs/Labs/Other Studies Reviewed:    The following studies were reviewed today:  EKG:  11/04/20 - NSR, inferior and anterior infarct pattern. 05/04/20 - NSR, inferior and anterior infarct pattern.   Recent Labs: 02/02/2020: Hemoglobin 14.6; Platelets 359; TSH 1.380 05/11/2020: ALT 13; BUN 10; Creatinine, Ser 0.77; Potassium 3.8; Sodium 142  Recent  Lipid Panel    Component Value Date/Time   CHOL 129 05/11/2020 0850   TRIG 133 05/11/2020 0850   HDL 40 05/11/2020 0850   CHOLHDL 3.2 05/11/2020 0850   CHOLHDL 4.4 08/29/2016 0920   VLDL 28 08/29/2016 0920   LDLCALC 65 05/11/2020 0850   LDLDIRECT 81 03/21/2013 1431    Physical Exam:    VS:  BP 122/64 (BP Location: Left Arm, Patient Position: Sitting, Cuff Size: Normal)   Pulse 77   Ht _0  (1.6 m)   Wt 162 lb (73.5 kg)   BMI 28.70 kg/m     Wt Readings from Last 5 Encounters:  11/04/20 162 lb (73.5 kg)  08/02/20 166 lb (75.3 kg)  07/16/20 169 lb (76.7 kg)  05/31/20 170 lb (77.1 kg)  05/13/20 168 lb 2 oz (76.3 kg)    Constitutional: No acute distress Eyes: sclera non-icteric, normal conjunctiva and lids ENMT: normal dentition, moist mucous membranes Cardiovascular: regular rhythm, normal rate, no murmurs. S1 and S2 normal. Radial pulses normal bilaterally. No jugular venous distention.  Respiratory: clear to auscultation bilaterally GI : normal bowel sounds, soft and nontender. No distention.   MSK: extremities warm, well perfused. No edema.  NEURO: grossly nonfocal exam, moves all extremities. PSYCH: alert  and oriented x 3, normal mood and affect.   ASSESSMENT:    1. Coronary artery disease involving native heart without angina pectoris, unspecified vessel or lesion type   2. S/P CABG x 3   3. Essential hypertension   4. Hyperlipidemia associated with type 2 diabetes mellitus ([place])   5. Type 2 diabetes mellitus with hyperglycemia, with long-term current use of insulin (HCC)     PLAN:    Coronary artery disease involving native heart without angina pectoris, unspecified vessel or lesion type S/P CABG x 3 - continue ASA 81 mg daily - continue metoprolol tartrate 37.5 mg BID - continue rosuvastatin 20 mg daily.   Essential hypertension - Plan: EKG 12-Lead - BP well controlled.  - continue amlodipine 10 mg daily - continue losartan 50 mg daily - continue metoprolol tartrate 37.5 mg BID  Hyperlipidemia associated with type 2 diabetes mellitus ([place])  - continue crestor 20 mg daily. Offered PCSK9I since she is having leg cramping - not representatitve of claudication. She will consider but defers for now.  Type 2 diabetes mellitus with hyperglycemia, with long-term current use of insulin (HCC) - she is on Farxiga, insulin, metformin per PCP  Total time of encounter: 30 minutes total time of encounter, including 20 minutes spent in face-to-face patient care on the date of this encounter. This time includes coordination of care and counseling regarding above mentioned problem list. Remainder of non-face-to-face time involved reviewing chart documents/testing relevant to the patient encounter and documentation in the medical record. I have independently reviewed documentation from referring provider.   Cherlynn Kaiser, MD, [facility]     Medication Adjustments/Labs and Tests Ordered: Current medicines are reviewed at length with the patient today.  Concerns regarding medicines are outlined above.   Orders Placed This Encounter  Procedures   EKG 12-Lead      No orders of the defined types were placed in this encounter.   Patient Instructions  Medication Instructions:  No Changes In Medications at this time.  *If you need a refill on your cardiac medications before your next appointment, please call your pharmacy*  Follow-Up: At [facility], you and your health needs are our priority.  As  part of our continuing mission to provide you with exceptional heart care, we have created designated Provider Care Teams.  These Care Teams include your primary Cardiologist (physician) and Advanced Practice Providers (APPs -  Physician Assistants and Nurse Practitioners) who all work together to provide you with the care you need, when you need it.  We recommend signing up for the patient portal called "MyChart".  Sign up information is provided on this After Visit Summary.  MyChart is used to connect with patients for Virtual Visits (Telemedicine).  Patients are able to view lab/test results, encounter notes, upcoming appointments, etc.  Non-urgent messages can be sent to your provider as well.   To learn more about what you can do with MyChart, go to [email or web address].    Your next appointment:   MARCH 28th at [facility]  The format for your next appointment:   In Person  Provider:   Cherlynn Kaiser, MD

## 2020-11-30 ENCOUNTER — Telehealth: Payer: Self-pay | Admitting: Internal Medicine

## 2020-11-30 NOTE — Telephone Encounter (Signed)
Pt would like to know if she can switch to you. You see her husband Cyrene Gharibian

## 2020-11-30 NOTE — Telephone Encounter (Signed)
Per shane ok to change

## 2021-01-31 ENCOUNTER — Other Ambulatory Visit: Payer: Self-pay

## 2021-01-31 MED ORDER — AMLODIPINE BESYLATE 10 MG PO TABS: 10.0000 mg | ORAL_TABLET | Freq: Every day | ORAL | 1 refills | Status: DC

## 2021-02-02 ENCOUNTER — Ambulatory Visit: Payer: Medicare HMO | Admitting: Medical

## 2021-02-02 ENCOUNTER — Other Ambulatory Visit: Payer: Self-pay

## 2021-02-02 DIAGNOSIS — I10 Essential (primary) hypertension: Secondary | ICD-10-CM

## 2021-02-02 MED ORDER — LOSARTAN POTASSIUM 50 MG PO TABS: 50.0000 mg | ORAL_TABLET | Freq: Every day | ORAL | 1 refills | Status: DC

## 2021-02-03 ENCOUNTER — Other Ambulatory Visit: Payer: Self-pay | Admitting: *Deleted

## 2021-02-03 DIAGNOSIS — Z87891 Personal history of nicotine dependence: Secondary | ICD-10-CM

## 2021-02-08 ENCOUNTER — Other Ambulatory Visit: Payer: Self-pay

## 2021-02-08 ENCOUNTER — Ambulatory Visit (INDEPENDENT_AMBULATORY_CARE_PROVIDER_SITE_OTHER): Payer: Medicare HMO | Admitting: Medical

## 2021-02-08 ENCOUNTER — Encounter: Payer: Self-pay | Admitting: Medical

## 2021-02-08 ENCOUNTER — Ambulatory Visit
Admission: RE | Admit: 2021-02-08 | Discharge: 2021-02-08 | Disposition: A | Discharge status: Home / Self Care | Payer: Medicare HMO | Source: Ambulatory Visit | Attending: Medical | Admitting: Medical

## 2021-02-08 VITALS — BP 108/62 | HR 82 | Temp 97.3°F | Wt 164.8 lb

## 2021-02-08 DIAGNOSIS — E1169 Type 2 diabetes mellitus with other specified complication: Secondary | ICD-10-CM

## 2021-02-08 DIAGNOSIS — E781 Pure hyperglyceridemia: Secondary | ICD-10-CM | POA: Diagnosis not present

## 2021-02-08 DIAGNOSIS — Z951 Presence of aortocoronary bypass graft: Secondary | ICD-10-CM

## 2021-02-08 DIAGNOSIS — I7090 Unspecified atherosclerosis: Secondary | ICD-10-CM | POA: Diagnosis not present

## 2021-02-08 DIAGNOSIS — R0902 Hypoxemia: Secondary | ICD-10-CM

## 2021-02-08 DIAGNOSIS — I2510 Atherosclerotic heart disease of native coronary artery without angina pectoris: Secondary | ICD-10-CM

## 2021-02-08 DIAGNOSIS — Z87891 Personal history of nicotine dependence: Secondary | ICD-10-CM | POA: Diagnosis not present

## 2021-02-08 DIAGNOSIS — I10 Essential (primary) hypertension: Secondary | ICD-10-CM

## 2021-02-08 DIAGNOSIS — E1165 Type 2 diabetes mellitus with hyperglycemia: Secondary | ICD-10-CM

## 2021-02-08 DIAGNOSIS — E785 Hyperlipidemia, unspecified: Secondary | ICD-10-CM

## 2021-02-08 DIAGNOSIS — R0602 Shortness of breath: Secondary | ICD-10-CM

## 2021-02-08 DIAGNOSIS — Z9981 Dependence on supplemental oxygen: Secondary | ICD-10-CM | POA: Insufficient documentation

## 2021-02-08 DIAGNOSIS — R21 Rash and other nonspecific skin eruption: Secondary | ICD-10-CM

## 2021-02-08 DIAGNOSIS — R051 Acute cough: Secondary | ICD-10-CM | POA: Insufficient documentation

## 2021-02-08 LAB — POCT GLYCOSYLATED HEMOGLOBIN (HGB A1C): Hemoglobin A1C: 6.3 % — AB (ref 4.0–5.6)

## 2021-02-08 LAB — POCT INFLUENZA A/B
Influenza A, POC: NEGATIVE
Influenza B, POC: NEGATIVE

## 2021-02-08 LAB — POC COVID19 BINAXNOW: SARS Coronavirus 2 Ag: NEGATIVE

## 2021-02-08 MED ORDER — CLOTRIMAZOLE-BETAMETHASONE 1-0.05 % EX CREA: 1.0000 "application " | TOPICAL_CREAM | Freq: Every day | CUTANEOUS | 0 refills | Status: DC

## 2021-02-08 MED ORDER — TRELEGY ELLIPTA 100-62.5-25 MCG/ACT IN AEPB: 1.0000 | INHALATION_SPRAY | Freq: Every day | RESPIRATORY_TRACT | 2 refills | Status: DC

## 2021-02-08 NOTE — Addendum Note (Signed)
Addended by: Carlena Hurl on: 02/08/2021 01:46 PM   Modules accepted: Orders

## 2021-02-08 NOTE — Progress Notes (Deleted)
Subjective:  Jessica Hart is a 80 y.o. female who presents for Chief Complaint  Patient presents with   Diabetes   Cough    For 3 days, SHOB on and off r/t chronic dx   Leg Pain    Left upper thigh where she hasnt been active for 1 week- states goes away after movement      SOB x 3 days,   No fever, no NVD  Gets loose stools on metformin  Gingerbread cookie  Dry nostril  Cramp in left calve few months   ***  Cabg x 3 2 years ago    Former smoker, quit 16years   No other aggravating or relieving factors.    No other c/o.  Past Medical History:  Diagnosis Date   Arthritis    Cataract bilaterl 3 years ago   Coronary artery disease    Diabetes mellitus 02/27/2005   Diabetes mellitus type 2, controlled, without complications ([place]) 5/64/3329   Qualifier: Diagnosis of  By: Jessica Hart     Diverticulosis    Dyspnea    Echocardiogram findings abnormal, without diagnosis 2005   EF 47%, no ischemia, no infarction   Former smoker 01/10/2017   45 pack year history, quit in 2007    GERD (gastroesophageal reflux disease)    Heart murmur    History of bone density study 2006   Lt hip = -1.0, L spine = -1.8    Hyperlipidemia    Hypertension    Current Outpatient Medications on File Prior to Visit  Medication Sig Dispense Refill   amLODipine (NORVASC) 10 MG tablet Take 1 tablet (10 mg total) by mouth daily. 90 tablet 1   aspirin EC 81 MG tablet Take 1 tablet (81 mg total) by mouth daily. Swallow whole. 90 tablet 3   dapagliflozin propanediol (FARXIGA) 5 MG TABS tablet Take 1 tablet (5 mg total) by mouth daily. 90 tablet 1   insulin degludec (TRESIBA FLEXTOUCH) 100 UNIT/ML FlexTouch Pen Inject 10 Units into the skin daily after supper. 4 mL 0   lipase/protease/amylase (CREON) 36000 UNITS CPEP capsule Take 2 capsules with each meal three times daily  Take 1 capsule with each snack twice daily 240 capsule 11   losartan (COZAAR) 50 MG tablet Take 1 tablet (50 mg  total) by mouth daily. 90 tablet 1   metFORMIN (GLUCOPHAGE) 1000 MG tablet TAKE 1 TABLET BY MOUTH TWICE DAILY WITH A MEAL 180 tablet 0   Metoprolol Tartrate 37.5 MG TABS Take 1 tablet by mouth 2 (two) times daily. 180 tablet 3   omeprazole (PRILOSEC) 40 MG capsule TAKE 1 CAPSULE BY MOUTH EVERY DAY 90 capsule 0   rosuvastatin (CRESTOR) 20 MG tablet Take 1 tablet (20 mg total) by mouth daily. 90 tablet 1   acetaminophen (TYLENOL) 500 MG tablet Take 2 tablets (1,000 mg total) by mouth every 8 (eight) hours as needed. (Patient taking differently: Take 1,000 mg by mouth 2 (two) times daily.) 30 tablet 2   Insulin Pen Needle (PEN NEEDLES 5/16") 30G X 8 MM MISC Pen needles used for lantus injections 100 each 3   OneTouch Delica Lancets 51O MISC USE TO TEST BLOOD SUGAR ONCE A DAY 100 each 1   ONETOUCH VERIO test strip USE 1 STRIP TO CHECK GLUCOSE ONCE DAILY 100 strip 1   No current facility-administered medications on file prior to visit.     The following portions of the patient's history were reviewed and updated as appropriate: allergies, current  medications, past family history, past medical history, past social history, past surgical history and problem list.  ROS Otherwise as in subjective above    Objective: BP 108/62 (BP Location: Right Arm, Patient Position: Sitting)    Pulse 82    Temp (!) 97.3 F (36.3 C) (Tympanic)    Wt 164 lb 12.8 oz (74.8 kg)    SpO2 (!) 88%    BMI 29.19 kg/m   BP Readings from Last 3 Encounters:  02/08/21 108/62  11/04/20 122/64  08/02/20 140/80   Wt Readings from Last 3 Encounters:  02/08/21 164 lb 12.8 oz (74.8 kg)  11/04/20 162 lb (73.5 kg)  08/02/20 166 lb (75.3 kg)     General appearance: alert, no distress, well developed, well nourished HEENT: normocephalic, sclerae anicteric, conjunctiva pink and moist, TMs pearly, nares patent, no discharge or erythema, pharynx normal Oral cavity: MMM, no lesions Neck: supple, no lymphadenopathy, no  thyromegaly, no masses Heart: RRR, normal S1, S2, no murmurs Lungs: CTA bilaterally, no wheezes, rhonchi, or rales Abdomen: +bs, soft, non tender, non distended, no masses, no hepatomegaly, no splenomegaly Pulses: 2+ radial pulses, 2+ pedal pulses, normal cap refill Ext: no edema   Assessment: Encounter Diagnosis  Name Primary?   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

## 2021-02-08 NOTE — Progress Notes (Addendum)
Subjective:  Jessica Hart is a 80 y.o. female who presents for Chief Complaint  Patient presents with   Diabetes   Cough    For 3 days, SHOB on and off r/t chronic dx   Leg Pain    Left upper thigh where she hasnt been active for 1 week- states goes away after movement     Here for med check.  She was initially scheduled for diabetes and med check.  However nurse noted her oxygen was low on triage.  She has underlying history of coronary artery disease with prior CABG x3, type 2 diabetes, former smoker, hypertension, hyperlipidemia, GERD, arthritis.     She last smoked about 16 years ago.  She was a pack a day x20 years plus smoker before.  She notes being involved in some type of lung study through [facility] health that has some type of yearly surveillance.  She has never been told or diagnosed about any lung disease  She does have a history of heart disease with prior CABG three-vessel.  She saw cardiology September 2022.  They felt things are stable at that time  She notes ongoing shortness of breath for months.  She has never been on oxygen or inhalers.    She does have 3-day history of a little bit of a cough but no sore throat, no ear pain no congestion, no sick contacts.  She has history of GERD underlying.   She notes compliance with her blood pressure and cholesterol medications  She notes compliance with diabetes medications.  No recent low sugar readings.  She has had some cramping in her left calf for the last few months.  She occasionally gets some loose stools on metformin  She requests refill on jock itch cream.  No other aggravating or relieving factors.    No other c/o.  Past Medical History:  Diagnosis Date   Arthritis    Cataract bilaterl 3 years ago   Coronary artery disease    Diabetes mellitus 02/27/2005   Diabetes mellitus type 2, controlled, without complications ([place]) 09/21/3662   Qualifier: Diagnosis of  By: Drucie Ip     Diverticulosis     Dyspnea    Echocardiogram findings abnormal, without diagnosis 2005   EF 47%, no ischemia, no infarction   Former smoker 01/10/2017   45 pack year history, quit in 2007    GERD (gastroesophageal reflux disease)    Heart murmur    History of bone density study 2006   Lt hip = -1.0, L spine = -1.8    Hyperlipidemia    Hypertension    Current Outpatient Medications on File Prior to Visit  Medication Sig Dispense Refill   amLODipine (NORVASC) 10 MG tablet Take 1 tablet (10 mg total) by mouth daily. 90 tablet 1   aspirin EC 81 MG tablet Take 1 tablet (81 mg total) by mouth daily. Swallow whole. 90 tablet 3   dapagliflozin propanediol (FARXIGA) 5 MG TABS tablet Take 1 tablet (5 mg total) by mouth daily. 90 tablet 1   insulin degludec (TRESIBA FLEXTOUCH) 100 UNIT/ML FlexTouch Pen Inject 10 Units into the skin daily after supper. 4 mL 0   lipase/protease/amylase (CREON) 36000 UNITS CPEP capsule Take 2 capsules with each meal three times daily  Take 1 capsule with each snack twice daily 240 capsule 11   losartan (COZAAR) 50 MG tablet Take 1 tablet (50 mg total) by mouth daily. 90 tablet 1   metFORMIN (GLUCOPHAGE) 1000 MG tablet  TAKE 1 TABLET BY MOUTH TWICE DAILY WITH A MEAL 180 tablet 0   Metoprolol Tartrate 37.5 MG TABS Take 1 tablet by mouth 2 (two) times daily. 180 tablet 3   omeprazole (PRILOSEC) 40 MG capsule TAKE 1 CAPSULE BY MOUTH EVERY DAY 90 capsule 0   rosuvastatin (CRESTOR) 20 MG tablet Take 1 tablet (20 mg total) by mouth daily. 90 tablet 1   acetaminophen (TYLENOL) 500 MG tablet Take 2 tablets (1,000 mg total) by mouth every 8 (eight) hours as needed. (Patient taking differently: Take 1,000 mg by mouth 2 (two) times daily.) 30 tablet 2   Insulin Pen Needle (PEN NEEDLES 5/16") 30G X 8 MM MISC Pen needles used for lantus injections 100 each 3   OneTouch Delica Lancets 83F MISC USE TO TEST BLOOD SUGAR ONCE A DAY 100 each 1   ONETOUCH VERIO test strip USE 1 STRIP TO CHECK GLUCOSE ONCE  DAILY 100 strip 1   No current facility-administered medications on file prior to visit.     The following portions of the patient's history were reviewed and updated as appropriate: allergies, current medications, past family history, past medical history, past social history, past surgical history and problem list.  ROS Otherwise as in subjective above    Objective: BP 108/62 (BP Location: Right Arm, Patient Position: Sitting)    Pulse 82    Temp (!) 97.3 F (36.3 C) (Tympanic)    Wt 164 lb 12.8 oz (74.8 kg)    SpO2 (!) 88%    BMI 29.19 kg/m   BP Readings from Last 3 Encounters:  02/08/21 108/62  11/04/20 122/64  08/02/20 140/80   Wt Readings from Last 3 Encounters:  02/08/21 164 lb 12.8 oz (74.8 kg)  11/04/20 162 lb (73.5 kg)  08/02/20 166 lb (75.3 kg)    General appearance: alert, no distress, well developed, well nourished HEENT: normocephalic, sclerae anicteric, conjunctiva pink and moist, TMs pearly, nares patent, no discharge or erythema, pharynx normal Oral cavity: MMM, no lesions Neck: supple, no lymphadenopathy, no thyromegaly, no masses Heart: RRR, normal S1, S2, no murmurs Lungs: Somewhat decreased breath sounds in the lower fields but no dullness, negative egophony,, no wheezes, rhonchi, or rales Abdomen: +bs, soft, non tender, non distended, no masses, no hepatomegaly, no splenomegaly Pulses: 2+ radial pulses, 2+ pedal pulses, normal cap refill Ext: no edema Negative Homans, no calf asymmetry, nontender calves without deformity     Assessment: Encounter Diagnoses  Name Primary?   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  She notes possibly being told low oxygen at cardiac rehab a year and a half ago but otherwise was unaware of any oxygen deficit.  She denies any prior lung disease.  She is a 84 pack/year former smoker quit about 16 years ago.  Today walking her around the building indoors she dropped down to  79 oxygen on room air.  We put her on 1 L of oxygen per minute and she improved up to 92% oxygen.  Looking back she had a CT scan last year December 2021 showing emphysema.  We discussed this finding.  I will begin her on trial of Trelegy and oxygen.  We will contact home health about trial to get oxygen at the home 1 L/min.  Referral to pulmonology as well.  We discussed risks and proper use of oxygen at home.   Diabetes-hemoglobin A1c at goal.  Continue metformin 1000 mg twice daily, Tresiba 10 units daily and Farxiga 5 mg daily.  Continue glucose monitoring  Hypertension-continue current medications metoprolol, losartan, amlodipine  Hyperlipidemia-continue Crestor rosuvastatin, aspirin daily  CABG x3, coronary artery disease, atherosclerosis-continue statin, continue routine follow-up with cardiology.  I reviewed her September 2022 cardiology notes  She tested negative for flu and COVID today  Rash - given reported tinea cruris rash, refilled short term lotrisone cream today  Jessica Hart was seen today for diabetes, cough and leg pain.  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) -     HgB A1c -     Microalbumin/Creatinine Ratio, Urine -     Comprehensive metabolic panel  Type 2 diabetes mellitus with hyperglycemia, unspecified whether long term insulin use (HCC) -     Microalbumin/Creatinine Ratio, Urine -     Comprehensive  metabolic panel  S/P CABG x 3  HYPERTENSION, BENIGN SYSTEMIC -     TSH  Hyperlipidemia associated with type 2 diabetes mellitus ([place]) -     Comprehensive metabolic panel  Former smoker -     Spirometry with graph -     DG Chest 2 View; Future  Coronary artery disease involving native coronary artery of native heart without angina pectoris -     DG Chest 2 View; Future  Atherosclerosis -     CBC with Differential/Platelet -     TSH  SOB (shortness of breath) -     CBC with Differential/Platelet -     TSH -     Spirometry with graph -     DG Chest 2 View; Future -     POC COVID-19 -     Influenza A/B -     Ambulatory referral to Pulmonology  Acute cough -     POC COVID-19 -     Influenza A/B -     Ambulatory referral to Pulmonology  Hypoxemia -     Ambulatory referral to Pulmonology  Rash  Other orders -     Fluticasone-Umeclidin-Vilant (TRELEGY ELLIPTA) 100-62.5-25 MCG/ACT AEPB; Inhale 1 puff into the lungs daily. -     clotrimazole-betamethasone (LOTRISONE) cream; Apply 1 application topically daily.  Spent > 45 minutes face to face with patient in discussion of symptoms, evaluation, plan and recommendations.     Follow up: pending labs, chest xray

## 2021-02-08 NOTE — Patient Instructions (Signed)
Please go to [facility] for your chest xray.   Their hours are 8am - 4:30 pm Monday - Friday.  Take your insurance card with you.  [facility] [phone]  [place] [address], [place], [place] [address]  [address] [place], [place] [address]

## 2021-02-08 NOTE — Progress Notes (Signed)
Starting Sp02  baseline: 88 % Walking for 1 minute 80% Walking for 2 minutes: 74 %  Applied 1 L of O2 via [place] per Kaylyn Layer verbal orders  1 Minute of Oxygen : 81% 2 Minutes : 86%

## 2021-02-09 ENCOUNTER — Other Ambulatory Visit: Payer: Self-pay | Admitting: Medical

## 2021-02-09 ENCOUNTER — Other Ambulatory Visit: Payer: Self-pay

## 2021-02-09 ENCOUNTER — Other Ambulatory Visit: Payer: Self-pay | Admitting: Internal Medicine

## 2021-02-09 DIAGNOSIS — J449 Chronic obstructive pulmonary disease, unspecified: Secondary | ICD-10-CM | POA: Diagnosis not present

## 2021-02-09 DIAGNOSIS — E1165 Type 2 diabetes mellitus with hyperglycemia: Secondary | ICD-10-CM

## 2021-02-09 DIAGNOSIS — Z794 Long term (current) use of insulin: Secondary | ICD-10-CM

## 2021-02-09 DIAGNOSIS — R0902 Hypoxemia: Secondary | ICD-10-CM

## 2021-02-09 DIAGNOSIS — K219 Gastro-esophageal reflux disease without esophagitis: Secondary | ICD-10-CM

## 2021-02-09 LAB — COMPREHENSIVE METABOLIC PANEL
ALT: 16 IU/L (ref 0–32)
AST: 20 IU/L (ref 0–40)
Albumin/Globulin Ratio: 1.7 (ref 1.2–2.2)
Albumin: 4.6 g/dL (ref 3.7–4.7)
Alkaline Phosphatase: 118 IU/L (ref 44–121)
BUN/Creatinine Ratio: 12 (ref 12–28)
BUN: 13 mg/dL (ref 8–27)
Bilirubin Total: 0.5 mg/dL (ref 0.0–1.2)
CO2: 19 mmol/L — ABNORMAL LOW (ref 20–29)
Calcium: 9.7 mg/dL (ref 8.7–10.3)
Chloride: 109 mmol/L — ABNORMAL HIGH (ref 96–106)
Creatinine, Ser: 1.05 mg/dL — ABNORMAL HIGH (ref 0.57–1.00)
Globulin, Total: 2.7 g/dL (ref 1.5–4.5)
Glucose: 110 mg/dL — ABNORMAL HIGH (ref 70–99)
Potassium: 4.4 mmol/L (ref 3.5–5.2)
Sodium: 143 mmol/L (ref 134–144)
Total Protein: 7.3 g/dL (ref 6.0–8.5)
eGFR: 54 mL/min/{1.73_m2} — ABNORMAL LOW (ref >59)

## 2021-02-09 LAB — CBC WITH DIFFERENTIAL/PLATELET
Basophils Absolute: 0.1 10*3/uL (ref 0.0–0.2)
Basos: 1 %
EOS (ABSOLUTE): 0.1 10*3/uL (ref 0.0–0.4)
Eos: 2 %
Hematocrit: 45.6 % (ref 34.0–46.6)
Hemoglobin: 14.5 g/dL (ref 11.1–15.9)
Immature Grans (Abs): 0 10*3/uL (ref 0.0–0.1)
Immature Granulocytes: 0 %
Lymphocytes Absolute: 1.9 10*3/uL (ref 0.7–3.1)
Lymphs: 21 %
MCH: 27.2 pg (ref 26.6–33.0)
MCHC: 31.8 g/dL (ref 31.5–35.7)
MCV: 85 fL (ref 79–97)
Monocytes Absolute: 0.7 10*3/uL (ref 0.1–0.9)
Monocytes: 8 %
Neutrophils Absolute: 6.2 10*3/uL (ref 1.4–7.0)
Neutrophils: 68 %
Platelets: 286 10*3/uL (ref 150–450)
RBC: 5.34 x10E6/uL — ABNORMAL HIGH (ref 3.77–5.28)
RDW: 14.6 % (ref 11.7–15.4)
WBC: 9 10*3/uL (ref 3.4–10.8)

## 2021-02-09 LAB — MICROALBUMIN / CREATININE URINE RATIO
Creatinine, Urine: 38.8 mg/dL
Microalb/Creat Ratio: 29 mg/g creat (ref 0–29)
Microalbumin, Urine: 11.2 ug/mL

## 2021-02-09 LAB — TSH: TSH: 1.67 u[IU]/mL (ref 0.450–4.500)

## 2021-02-09 MED ORDER — OMEPRAZOLE 40 MG PO CPDR: DELAYED_RELEASE_CAPSULE | ORAL | 1 refills | Status: DC

## 2021-02-09 MED ORDER — PEN NEEDLES 32G X 4 MM MISC: 1.0000 | Freq: Every day | 1 refills | Status: DC

## 2021-02-09 MED ORDER — MISC. DEVICES MISC: 0 refills | Status: AC

## 2021-02-09 MED ORDER — METFORMIN HCL ER 750 MG PO TB24: 750.0000 mg | ORAL_TABLET | Freq: Every day | ORAL | 0 refills | Status: DC

## 2021-02-09 MED ORDER — ROSUVASTATIN CALCIUM 20 MG PO TABS: 20.0000 mg | ORAL_TABLET | Freq: Every day | ORAL | 3 refills | Status: DC

## 2021-02-09 MED ORDER — DAPAGLIFLOZIN PROPANEDIOL 5 MG PO TABS: 5.0000 mg | ORAL_TABLET | Freq: Every day | ORAL | 1 refills | Status: DC

## 2021-02-09 MED ORDER — ASPIRIN EC 81 MG PO TBEC: 81.0000 mg | DELAYED_RELEASE_TABLET | Freq: Every day | ORAL | 3 refills | Status: DC

## 2021-02-09 MED ORDER — TRESIBA FLEXTOUCH 100 UNIT/ML ~~LOC~~ SOPN: 15.0000 [IU] | PEN_INJECTOR | Freq: Every day | SUBCUTANEOUS | 2 refills | Status: DC

## 2021-02-14 ENCOUNTER — Ambulatory Visit (INDEPENDENT_AMBULATORY_CARE_PROVIDER_SITE_OTHER)
Admission: RE | Admit: 2021-02-14 | Discharge: 2021-02-14 | Disposition: A | Discharge status: Home / Self Care | Payer: Medicare HMO | Source: Ambulatory Visit | Attending: Acute Care | Admitting: Acute Care

## 2021-02-14 ENCOUNTER — Telehealth: Payer: Self-pay | Admitting: Pulmonary Disease

## 2021-02-14 ENCOUNTER — Other Ambulatory Visit: Payer: Self-pay

## 2021-02-14 DIAGNOSIS — Z87891 Personal history of nicotine dependence: Secondary | ICD-10-CM

## 2021-02-14 NOTE — Telephone Encounter (Signed)
FYI to provider

## 2021-02-15 ENCOUNTER — Other Ambulatory Visit: Payer: Self-pay

## 2021-02-15 ENCOUNTER — Telehealth: Payer: Self-pay | Admitting: Acute Care

## 2021-02-15 DIAGNOSIS — R911 Solitary pulmonary nodule: Secondary | ICD-10-CM

## 2021-02-15 DIAGNOSIS — Z87891 Personal history of nicotine dependence: Secondary | ICD-10-CM

## 2021-02-15 NOTE — Telephone Encounter (Signed)
I have attempted to call the patient with the results of her low dose CT Chest. There was no answer. I have left a HIPPA compliant message on the patient's VM with the office Contact information asking that she call the office for her results.  Langley Gauss, she is a Lung  RADS 3, nodules that are probably benign findings, short term follow up suggested: includes nodules with a low likelihood of becoming a clinically active cancer. Radiology recommends a 6 month repeat LDCT follow up.  She has been sick though ( looking at he PCP notes from 2014) . So 6 month follow up.  I will try to get in touch with her again tomorrow.  Thanks

## 2021-02-15 NOTE — Telephone Encounter (Signed)
Order placed for 6 month nodule follow up CT.

## 2021-02-17 ENCOUNTER — Other Ambulatory Visit: Payer: Self-pay

## 2021-02-17 ENCOUNTER — Ambulatory Visit (INDEPENDENT_AMBULATORY_CARE_PROVIDER_SITE_OTHER): Payer: Medicare HMO | Admitting: Medical

## 2021-02-17 ENCOUNTER — Telehealth: Payer: Self-pay | Admitting: Acute Care

## 2021-02-17 ENCOUNTER — Telehealth: Payer: Self-pay

## 2021-02-17 VITALS — BP 136/78 | HR 89 | Resp 16 | Wt 163.6 lb

## 2021-02-17 DIAGNOSIS — J439 Emphysema, unspecified: Secondary | ICD-10-CM | POA: Diagnosis not present

## 2021-02-17 DIAGNOSIS — Z951 Presence of aortocoronary bypass graft: Secondary | ICD-10-CM

## 2021-02-17 DIAGNOSIS — R9389 Abnormal findings on diagnostic imaging of other specified body structures: Secondary | ICD-10-CM | POA: Insufficient documentation

## 2021-02-17 DIAGNOSIS — I10 Essential (primary) hypertension: Secondary | ICD-10-CM

## 2021-02-17 DIAGNOSIS — E1165 Type 2 diabetes mellitus with hyperglycemia: Secondary | ICD-10-CM | POA: Diagnosis not present

## 2021-02-17 DIAGNOSIS — R0902 Hypoxemia: Secondary | ICD-10-CM | POA: Diagnosis not present

## 2021-02-17 DIAGNOSIS — I7090 Unspecified atherosclerosis: Secondary | ICD-10-CM | POA: Diagnosis not present

## 2021-02-17 DIAGNOSIS — Z87891 Personal history of nicotine dependence: Secondary | ICD-10-CM

## 2021-02-17 DIAGNOSIS — R918 Other nonspecific abnormal finding of lung field: Secondary | ICD-10-CM | POA: Insufficient documentation

## 2021-02-17 DIAGNOSIS — R0600 Dyspnea, unspecified: Secondary | ICD-10-CM

## 2021-02-17 NOTE — Patient Instructions (Signed)
Bronchiectasis Bronchiectasis is a condition in which the airways in the lungs (bronchi) are damaged and widened. The condition makes it hard for the lungs to get rid of mucus, and it causes mucus to gather in the bronchi. This condition often leads to lung infections, which can make the condition worse. What are the causes? You can be born with this condition, or you can develop it later in life. Common causes of this condition include: Cystic fibrosis. Repeated lung infections, such as pneumonia or tuberculosis. An object or other blockage in the lungs. Breathing in fluid, food, or other objects (aspiration). A problem with the body's defense system (immune system) and lung structure that is present at birth (congenital). Sometimes the cause is not known. What are the signs or symptoms? Common symptoms of this condition include: A daily cough that brings up mucus and lasts for more than 3 weeks. Lung infections that happen often. Shortness of breath and wheezing. Weakness and feeling tired (fatigue). How is this diagnosed? This condition is diagnosed with tests, such as: Chest X-rays or CT scans. These are done to check for changes in the lungs. Breathing tests. These are done to check how well your lungs are working. A test of a sample of your saliva (sputum culture). This test is done to check for infection. Blood tests and other tests. These are done to check for related diseases or causes. How is this treated? Treatment for this condition depends on the severity of the illness and its cause. Treatment may include: Medicines that loosen mucus so it can be coughed up (mucolytics). Medicines that relax the muscles of the bronchi (bronchodilators). Antibiotic medicines to prevent or treat infection. Physical therapy to help clear mucus from the lungs. Techniques may include: Postural drainage. This is when you sit or lie in certain positions so that mucus can drain by gravity. Chest  percussion. This involves tapping the chest or back with a cupped hand. Chest vibration. For this therapy, a hand or special equipment vibrates your chest and back. Surgery to remove the affected part of the lung. This may be done in severe cases. Follow these instructions at home: Medicines Take over-the-counter and prescription medicines only as told by your health care provider. If you were prescribed an antibiotic medicine, take it as told by your health care provider. Do not stop taking the antibiotic even if you start to feel better. Avoid taking sedatives and antihistamines unless your health care provider tells you to take them. These medicines tend to thicken the mucus in the lungs. Managing symptoms Do breathing exercises or techniques to clear your lungs as told by your health care provider. Consider using a cold steam vaporizer or humidifier in your room or home to help loosen secretions. If you have a cough that gets worse at night, try sleeping in a semi-upright position. General instructions Get plenty of rest. Drink enough fluid to keep your urine pale yellow. Stay inside when pollution and ozone levels are high. Stay up to date with vaccinations and immunizations. Avoid cigarette smoke and other lung irritants. Do not use any products that contain nicotine or tobacco. These products include cigarettes, chewing tobacco, and vaping devices, such as e-cigarettes. If you need help quitting, ask your health care provider. Keep all follow-up visits. This is important. Contact a health care provider if: You cough up more sputum than before and the sputum is yellow or green in color. You have a fever or chills. You cannot control your cough  and are losing sleep. Get help right away if: You cough up blood. You have chest pain. You have increasing shortness of breath. You have pain that gets worse or is not controlled with medicines. You have a fever and your symptoms suddenly get  worse. These symptoms may be an emergency. Get help right away. Call 911. Do not wait to see if the symptoms will go away. Do not drive yourself to the hospital. Summary Bronchiectasis is a condition in which the airways in the lungs (bronchi) are damaged and widened. The condition makes it hard for the lungs to get rid of mucus, and it causes mucus to gather in the bronchi. Treatment usually includes therapy to help clear mucus from the lungs. Avoid cigarette smoke and other lung irritants. Stay up to date with vaccinations and immunizations. This information is not intended to replace advice given to you by your health care provider. Make sure you discuss any questions you have with your health care provider. Document Revised: 10/27/2020 Document Reviewed: 09/14/2020 Elsevier Patient Education  [place].

## 2021-02-17 NOTE — Telephone Encounter (Signed)
Attempted to call patient at the request of Dr. Margaretann Loveless who would like patient to have an Echocardiogram prior to her Pulmonology appointment on 12/29. Left message to patient to call back.   Order for Echo placed.

## 2021-02-17 NOTE — Telephone Encounter (Signed)
See telephone note 02/15/21

## 2021-02-17 NOTE — Progress Notes (Signed)
I have called the patient with the results of her low dose CT Chest. I explained that her scan was read as a Lung  RADS 3, nodules that are probably benign findings, short term follow up suggested: includes nodules with a low likelihood of becoming a clinically active cancer. Radiology recommends a 6 month repeat LDCT follow up. She is in agreement with a 6 month follow up CT to re-evaluate her nodule.  °[name], 6 month follow up Low Dose CT chest. Fax results to PCP. Thanks so much °

## 2021-02-17 NOTE — Progress Notes (Signed)
Subjective:  Jessica Hart is a 80 y.o. female who presents for Chief Complaint  Patient presents with   recheck breathing    Recheck breathing     Here for 1 week follow-up.  I just started seeing her recently as a new patient  Formally she was seeing Harland Dingwall nurse practitioner here in our office before Ovando left  Medical team: Dr. Cherlynn Kaiser, cardiology Dr. Harl Bowie, gastroenterology Dr. Rutherford Guys, ophthalmology Dr. Melony Overly, ENT Dr. Melodie Bouillon, cardiothoracic surgery Dr. Princess Bruins, gynecology  She has underlying history of coronary artery disease with prior CABG x3, type 2 diabetes, former smoker, hypertension, hyperlipidemia, GERD, arthritis.    At her recent visit she came in for a med check however the nurse noted that she complained of shortness of breath and her oxygen levels were quite low on initial triage  She just saw cardiology back in September.  She did not have any chest pain or palpitations or edema.  Lungs sounded relatively clear, but she did drop in her oxygen walking around the hallway  We got her started on oxygen 1 L/min and Trelegy sample last visit.  She does note improvements in her overall energy and breathing since last week.  She has oxygen with her today.  She also feels like the Trelegy open her lungs up where she can get a better breath.  She is part of some type of ongoing study.  She had a CT chest per that study last week.   She last smoked about 16 years ago.  She was a pack a day x20 years plus smoker before.  She notes being involved in some type of lung study through [facility] that has some type of yearly surveillance.  She reports that she has never been told or diagnosed about any lung disease  She does have a history of heart disease with prior CABG three-vessel.  She saw cardiology September 2022.  They felt things were stable at that time  She notes ongoing shortness of breath for  months.  She has never been on oxygen or inhalers.    She has history of GERD underlying.   She notes compliance with her blood pressure and cholesterol medications  She notes compliance with diabetes medications.  No recent low sugar readings.  No other aggravating or relieving factors.    No other c/o.  Past Medical History:  Diagnosis Date   Arthritis    Cataract bilaterl 3 years ago   Coronary artery disease    Diabetes mellitus 02/27/2005   Diabetes mellitus type 2, controlled, without complications ([place]) 08/13/735   Qualifier: Diagnosis of  By: Drucie Ip     Diverticulosis    Dyspnea    Echocardiogram findings abnormal, without diagnosis 2005   EF 47%, no ischemia, no infarction   Former smoker 01/10/2017   45 pack year history, quit in 2007    GERD (gastroesophageal reflux disease)    Heart murmur    History of bone density study 2006   Lt hip = -1.0, L spine = -1.8    Hyperlipidemia    Hypertension    Current Outpatient Medications on File Prior to Visit  Medication Sig Dispense Refill   acetaminophen (TYLENOL) 500 MG tablet Take 2 tablets (1,000 mg total) by mouth every 8 (eight) hours as needed. (Patient taking differently: Take 1,000 mg by mouth 2 (two) times daily.) 30 tablet 2   amLODipine (NORVASC) 10 MG tablet Take 1 tablet (  10 mg total) by mouth daily. 90 tablet 1   aspirin EC 81 MG tablet Take 1 tablet (81 mg total) by mouth daily. Swallow whole. 90 tablet 3   clotrimazole-betamethasone (LOTRISONE) cream Apply 1 application topically daily. 30 g 0   dapagliflozin propanediol (FARXIGA) 5 MG TABS tablet Take 1 tablet (5 mg total) by mouth daily. 90 tablet 1   Fluticasone-Umeclidin-Vilant (TRELEGY ELLIPTA) 100-62.5-25 MCG/ACT AEPB Inhale 1 puff into the lungs daily. 1 each 2   insulin degludec (TRESIBA FLEXTOUCH) 100 UNIT/ML FlexTouch Pen Inject 15 Units into the skin daily after supper. 3 mL 2   Insulin Pen Needle (PEN NEEDLES 5/16") 30G X 8 MM MISC Pen  needles used for lantus injections 100 each 3   Insulin Pen Needle (PEN NEEDLES) 32G X 4 MM MISC 1 each by Other route daily. 100 each 1   lipase/protease/amylase (CREON) 36000 UNITS CPEP capsule Take 2 capsules with each meal three times daily  Take 1 capsule with each snack twice daily 240 capsule 11   losartan (COZAAR) 50 MG tablet Take 1 tablet (50 mg total) by mouth daily. 90 tablet 1   metFORMIN (GLUCOPHAGE-XR) 750 MG 24 hr tablet TAKE 1 TABLET BY MOUTH EVERY DAY WITH BREAKFAST 90 tablet 0   Metoprolol Tartrate 37.5 MG TABS Take 1 tablet by mouth 2 (two) times daily. 180 tablet 3   Misc. Devices MISC SIG: 1 L of Oxygen via Nasal cannula around the clock Dispense Home Oxygen and Portable tank and necessary supplies. Dx: Hypoxemia, Shortness of breath 1 Device 0   omeprazole (PRILOSEC) 40 MG capsule TAKE 1 CAPSULE BY MOUTH EVERY DAY 90 capsule 1   OneTouch Delica Lancets 21Y MISC USE TO TEST BLOOD SUGAR ONCE A DAY 100 each 1   ONETOUCH VERIO test strip USE 1 STRIP TO CHECK GLUCOSE ONCE DAILY 100 strip 1   rosuvastatin (CRESTOR) 20 MG tablet Take 1 tablet (20 mg total) by mouth daily. 90 tablet 3   No current facility-administered medications on file prior to visit.     The following portions of the patient's history were reviewed and updated as appropriate: allergies, current medications, past family history, past medical history, past social history, past surgical history and problem list.  ROS Otherwise as in subjective above    Objective: BP 136/78    Pulse 89    Resp 16    Wt 163 lb 9.6 oz (74.2 kg)    SpO2 95%    BMI 28.98 kg/m   BP Readings from Last 3 Encounters:  02/17/21 136/78  02/08/21 108/62  11/04/20 122/64   Wt Readings from Last 3 Encounters:  02/17/21 163 lb 9.6 oz (74.2 kg)  02/08/21 164 lb 12.8 oz (74.8 kg)  11/04/20 162 lb (73.5 kg)    General appearance: alert, no distress, well developed, well nourished HEENT: normocephalic, sclerae anicteric,  conjunctiva pink and moist, TMs pearly, nares patent, no discharge or erythema, pharynx normal Oral cavity: MMM, no lesions Neck: supple, no lymphadenopathy, no thyromegaly, no masses Heart: RRR, normal S1, S2, no murmurs Lungs: Somewhat decreased breath sounds in the lower fields but no dullness, negative egophony,, no wheezes, rhonchi, or rales Pulses: 2+ radial pulses, 2+ pedal pulses, normal cap refill Ext: no edema Negative Homans, no calf asymmetry, nontender calves without deformity     Assessment: Encounter Diagnoses  Name Primary?   Pulmonary emphysema, unspecified emphysema type ([place]) Yes   Pulmonary nodules    Abnormal chest CT  Hypoxemia    Former smoker    Type 2 diabetes mellitus with hyperglycemia, unspecified whether long term insulin use (HCC)    S/P CABG x 3    Primary hypertension       Plan: We discussed her CT findings.  I will send a copy of the CT report to cardiology given the notations about pulmonary hypertension and atherosclerosis  She has several findings that will require follow-up.  She sees pulmonology for consult and 1 week.  Given the findings including emphysematous changes, bronchiectasis notations, pulmonary nodules and her newly found hypoxemia-she will need follow-up with pulmonology to discuss evaluation and treatment.  I will leave her at 1 L/min on oxygen today and Trelegy samples since she thinks is the improvement so far.  She stayed around 95% oxygen on 1 L/min but actually walked in to our office today desaturating down to about 87%  She has a 20 pack/year former smoker quit about 16 years ago.     Atherosclerosis-continue statin, low-cholesterol diet.  We spent some time talking about diet.  She does eat hot dogs and some snack cakes regularly.  We discussed cutting out more of the high cholesterol foods in her diet  Diabetes-hemoglobin A1c at goal.  Continue metformin 1000 mg twice daily, Tresiba 10 units daily and Farxiga 5 mg  daily.  Continue glucose monitoring  Hypertension-continue current medications metoprolol, losartan, amlodipine  Hyperlipidemia-continue Crestor rosuvastatin, aspirin daily  CABG x3, coronary artery disease, atherosclerosis-continue statin, continue routine follow-up with cardiology.  I reviewed her September 2022 cardiology notes   Meagon was seen today for recheck breathing.  Diagnoses and all orders for this visit:  Pulmonary emphysema, unspecified emphysema type ([place])  Pulmonary nodules  Abnormal chest CT  Hypoxemia  Former smoker  Type 2 diabetes mellitus with hyperglycemia, unspecified whether long term insulin use ([place])  S/P CABG x 3  Primary hypertension     Follow up: With pulmonology next week as planned

## 2021-02-17 NOTE — Telephone Encounter (Signed)
I have called the patient with the results of her low dose CT Chest. I explained that her scan was read as a Lung  RADS 3, nodules that are probably benign findings, short term follow up suggested: includes nodules with a low likelihood of becoming a clinically active cancer. Radiology recommends a 6 month repeat LDCT follow up. She is in agreement with a 6 month follow up CT to re-evaluate her nodule.  [name], 6 month follow up Low Dose CT chest. Fax results to PCP. Thanks so much

## 2021-02-17 NOTE — Telephone Encounter (Signed)
See telephone note 02/17/21

## 2021-02-24 ENCOUNTER — Ambulatory Visit: Payer: Medicare HMO | Admitting: Pulmonary Disease

## 2021-02-24 ENCOUNTER — Encounter: Payer: Self-pay | Admitting: Pulmonary Disease

## 2021-02-24 ENCOUNTER — Other Ambulatory Visit: Payer: Self-pay

## 2021-02-24 VITALS — BP 138/64 | HR 89 | Temp 97.9°F | Ht 63.0 in | Wt 162.6 lb

## 2021-02-24 DIAGNOSIS — J439 Emphysema, unspecified: Secondary | ICD-10-CM | POA: Diagnosis not present

## 2021-02-24 NOTE — Telephone Encounter (Signed)
Patient is scheduled for an Echo on 03/08/21.

## 2021-02-24 NOTE — Patient Instructions (Signed)
Nice to meet you  I think you need oxygen due to the history of cigarette smoking.  We will get breathing test in the coming weeks to look for any other reason that you may have low oxygen.  Your CT scans in the past have shown possible evidence of scarring on the bottom of the lungs.  I suspect this may be due to your longstanding history of heartburn and reflux that can go down the wrong tube and cause scarring in the body of the lungs.  Continue or keep your scheduled CT scan following the nodules.  We may need to repeat a special high-resolution CT scan in the next few months to further evaluate if there is any scarring on the lung.  Continue Trelegy.  We will walk you today and see if he can qualify for a portable oxygen concentrator with exertion.  Return to clinic in 3 months or sooner as needed with Dr. Silas Flood

## 2021-02-24 NOTE — Progress Notes (Signed)
_0  ID: Jessica Hart, female    DOB: 1941-01-13, 80 y.o.   MRN: [id number]  Chief Complaint  Patient presents with   Consult    Consult for cough, SOB and low oxygen. This started about 1 month ago. Is on an o2 tank on 1L as needed.     Referring provider: Carlena Hurl, PA-C  HPI:   80 y.o. woman whom we are seeing in consultation for evaluation of chronic hypoxemic respiratory failure.  Note from PCP x2 reviewed.  Was in usual state of health.  Was seen PCP and routine follow-up.  Noted to be hypoxemic work-up, rooming.  Noted little bit of cough for 3 days.  She was placed on 1 L of oxygen.  Came up to 92%.  She was placed on Trelegy.  She was referred to pulmonary.  Home oxygen was delivered.  She represented to PCP about 10 days later.  Noted to be at 87% on 1 L with exertion although no changes were made to oxygen prescription.  She denies significant dyspnea, nothing too significant.  Able to do tasks he needs to do.  Reviewed multiple CT scans that shows mild emphysematous changes primarily in the upper lobes with interlobular septal thickening and bronchiectasis in bilateral lower lobes without clear honeycombing on my interpretation.  PMH: Hypertension, diabetes, hyperlipidemia, tobacco abuse in remission surgical history: Colon surgery, hysterectomy Family history: CAD in first relatives, no significant [name] is a 80 year old with Social history: Former smoker, 45-pack-year, quit 2008, retired, lives with husband Restaurant manager, fast food / Pulmonary Flowsheets:   ACT:  No flowsheet data found.  MMRC: No flowsheet data found.  Epworth:  No flowsheet data found.  Tests:   FENO:  No results found for: NITRICOXIDE  PFT: No flowsheet data found.  WALK:  No flowsheet data found.  Imaging: Personally reviewed and as per EMR discussion of this note DG Chest 2 View  Result Date: 02/09/2021 CLINICAL DATA:  80 year old female with shortness of  breath EXAM: CHEST - 2 VIEW COMPARISON:  10/17/2019 FINDINGS: Cardiomediastinal silhouette unchanged in size and contour. Surgical changes of median sternotomy and CABG. Aortic valve calcifications. No pneumothorax.  No pleural effusion. Stigmata of emphysema, with increased retrosternal airspace, flattened hemidiaphragms, increased AP diameter, and hyperinflation on the AP view. Reticulonodular opacities in the mid and lower lungs, more pronounced than the comparison. No confluent airspace disease. No interlobular septal thickening. Degenerative changes the spine.  No acute displaced fracture IMPRESSION: Reticulonodular opacities, potentially atypical infection or progression of chronic changes, with a background of emphysema and scarring. Surgical changes of median sternotomy and CABG Electronically Signed   By: Corrie Mckusick D.O.   On: 02/09/2021 14:13   CT CHEST LUNG CA SCREEN LOW DOSE W/O CM  Result Date: 02/14/2021 CLINICAL DATA:  Former smoker, quit 14 years ago, 74 pack-year history. EXAM: CT CHEST WITHOUT CONTRAST LOW-DOSE FOR LUNG CANCER SCREENING TECHNIQUE: Multidetector CT imaging of the chest was performed following the standard protocol without IV contrast. COMPARISON:  02/05/2020. FINDINGS: Cardiovascular: Atherosclerotic calcification of the aorta. Pulmonic trunk and heart are enlarged. No pericardial effusion. Mediastinum/Nodes: 12 mm low-attenuation left thyroid nodule. No follow-up recommended. (Ref: J Am Coll Radiol. 2015 Feb;12(2): 143-50).Mediastinal lymph nodes measure up to 1.5 cm in the low right paratracheal station, similar. Hilar regions are difficult to evaluate without IV contrast. No axillary adenopathy. Esophagus is grossly unremarkable. Lungs/Pleura: Centrilobular and paraseptal emphysema. Bibasilar bronchiectasis/bronchiolectasis with linear densities and possible subpleural reticulation,  similar. There are new pulmonary nodules measuring up to 5.9 mm in the right middle lobe  (3/200). Pre-existing pulmonary nodules measure 4.9 mm or less in size, similar. No pleural fluid. Airway is unremarkable. Upper Abdomen: Visualized portions of the liver, adrenal glands, kidneys, spleen, pancreas, stomach and bowel are grossly unremarkable. Musculoskeletal: Degenerative changes in the spine. No worrisome lytic or sclerotic lesions. IMPRESSION: 1. Lung-RADS 3, probably benign findings. Short-term follow-up in 6 months is recommended with repeat low-dose chest CT without contrast (please use the following order, "CT CHEST LCS NODULE FOLLOW-UP W/O CM"). new pulmonary nodules measure up to 5.9 mm in the right middle lobe. These results will be called to the ordering clinician or representative by the Radiologist Assistant, and communication documented in the PACS or [employer]. 2. Bibasilar bronchiectasis/bronchiolectasis with linear densities and possible subpleural reticulation, as before. If further evaluation for interstitial lung disease is desired, high-resolution chest CT without contrast is recommended. 3.  Aortic atherosclerosis (ICD10-I70.0). 4. Enlarged pulmonic trunk, indicative of pulmonary arterial hypertension. 5.  Emphysema (ICD10-J43.9). Electronically Signed   By: Lorin Picket M.D.   On: 02/14/2021 15:43    Lab Results: Personally reviewed, no anemia, no polycythemia CBC    Component Value Date/Time   WBC 9.0 02/08/2021 1205   WBC 14.2 (H) 09/05/2019 0352   RBC 5.34 (H) 02/08/2021 1205   RBC 4.40 09/05/2019 0352   HGB 14.5 02/08/2021 1205   HCT 45.6 02/08/2021 1205   PLT 286 02/08/2021 1205   MCV 85 02/08/2021 1205   MCH 27.2 02/08/2021 1205   MCH 28.6 09/05/2019 0352   MCHC 31.8 02/08/2021 1205   MCHC 32.6 09/05/2019 0352   RDW 14.6 02/08/2021 1205   LYMPHSABS 1.9 02/08/2021 1205   MONOABS 776 08/29/2016 0920   EOSABS 0.1 02/08/2021 1205   BASOSABS 0.1 02/08/2021 1205    BMET    Component Value Date/Time   NA 143 02/08/2021 1205   K 4.4  02/08/2021 1205   CL 109 (H) 02/08/2021 1205   CO2 19 (L) 02/08/2021 1205   GLUCOSE 110 (H) 02/08/2021 1205   GLUCOSE 101 (H) 09/05/2019 0352   BUN 13 02/08/2021 1205   CREATININE 1.05 (H) 02/08/2021 1205   CREATININE 0.69 01/10/2017 1235   CALCIUM 9.7 02/08/2021 1205   GFRNONAA 84 02/02/2020 1108   GFRNONAA 86 01/10/2017 1235   GFRAA 97 02/02/2020 1108   GFRAA 99 01/10/2017 1235    BNP No results found for: BNP  ProBNP No results found for: PROBNP  Specialty Problems       Pulmonary Problems   Acute cough   SOB (shortness of breath)   Pulmonary emphysema (HCC)   Pulmonary nodules    Allergies  Allergen Reactions   Lisinopril Itching and Cough    Immunization History  Administered Date(s) Administered   Fluad Quad(high Dose 65+) 11/11/2018   Influenza Whole 12/11/2007   Influenza, High Dose Seasonal PF 02/29/2016, 01/10/2017, 11/15/2017   Influenza,inj,Quad PF,6+ Mos 10/29/2012, 04/06/2014, 02/19/2015   PFIZER(Purple Top)SARS-COV-2 Vaccination 04/22/2019, 05/13/2019, 01/30/2020   Pneumococcal Conjugate-13 04/06/2014   Pneumococcal Polysaccharide-23 12/11/2007   Td 05/28/2004    Past Medical History:  Diagnosis Date   Arthritis    Cataract bilaterl 3 years ago   Coronary artery disease    Diabetes mellitus 02/27/2005   Diabetes mellitus type 2, controlled, without complications ([place]) 07/14/6158   Qualifier: Diagnosis of  By: Drucie Ip     Diverticulosis    Dyspnea  Echocardiogram findings abnormal, without diagnosis 2005   EF 47%, no ischemia, no infarction   Former smoker 01/10/2017   45 pack year history, quit in 2007    GERD (gastroesophageal reflux disease)    Heart murmur    History of bone density study 2006   Lt hip = -1.0, L spine = -1.8    Hyperlipidemia    Hypertension     Tobacco History: Social History   Tobacco Use  Smoking Status Former   Packs/day: 1.00   Years: 45.00   Pack years: 45.00   Types: Cigarettes   Quit date:  07/27/2005   Years since quitting: 15.5  Smokeless Tobacco Former   Quit date: 03/2005   Counseling given: Not Answered   Continue to not smoke  Outpatient Encounter Medications as of 02/24/2021  Medication Sig   acetaminophen (TYLENOL) 500 MG tablet Take 2 tablets (1,000 mg total) by mouth every 8 (eight) hours as needed. (Patient taking differently: Take 1,000 mg by mouth 2 (two) times daily.)   amLODipine (NORVASC) 10 MG tablet Take 1 tablet (10 mg total) by mouth daily.   aspirin EC 81 MG tablet Take 1 tablet (81 mg total) by mouth daily. Swallow whole.   clotrimazole-betamethasone (LOTRISONE) cream Apply 1 application topically daily.   dapagliflozin propanediol (FARXIGA) 5 MG TABS tablet Take 1 tablet (5 mg total) by mouth daily.   Fluticasone-Umeclidin-Vilant (TRELEGY ELLIPTA) 100-62.5-25 MCG/ACT AEPB Inhale 1 puff into the lungs daily.   insulin degludec (TRESIBA FLEXTOUCH) 100 UNIT/ML FlexTouch Pen Inject 15 Units into the skin daily after supper.   Insulin Pen Needle (PEN NEEDLES 5/16") 30G X 8 MM MISC Pen needles used for lantus injections   Insulin Pen Needle (PEN NEEDLES) 32G X 4 MM MISC 1 each by Other route daily.   lipase/protease/amylase (CREON) 36000 UNITS CPEP capsule Take 2 capsules with each meal three times daily  Take 1 capsule with each snack twice daily   losartan (COZAAR) 50 MG tablet Take 1 tablet (50 mg total) by mouth daily.   metFORMIN (GLUCOPHAGE-XR) 750 MG 24 hr tablet TAKE 1 TABLET BY MOUTH EVERY DAY WITH BREAKFAST   Metoprolol Tartrate 37.5 MG TABS Take 1 tablet by mouth 2 (two) times daily.   Misc. Devices MISC SIG: 1 L of Oxygen via Nasal cannula around the clock Dispense Home Oxygen and Portable tank and necessary supplies. Dx: Hypoxemia, Shortness of breath   omeprazole (PRILOSEC) 40 MG capsule TAKE 1 CAPSULE BY MOUTH EVERY DAY   OneTouch Delica Lancets 78G MISC USE TO TEST BLOOD SUGAR ONCE A DAY   ONETOUCH VERIO test strip USE 1 STRIP TO CHECK GLUCOSE  ONCE DAILY   rosuvastatin (CRESTOR) 20 MG tablet Take 1 tablet (20 mg total) by mouth daily.   No facility-administered encounter medications on file as of 02/24/2021.     Review of Systems  Review of Systems  No chest pain with exertion.  No orthopnea or PND.  Conference review of systems otherwise negative. Physical Exam  BP 138/64 (BP Location: Left Arm, Patient Position: Sitting, Cuff Size: Normal)    Pulse 89    Temp 97.9 F (36.6 C) (Oral)    Ht _0  (1.6 m)    Wt 162 lb 9.6 oz (73.8 kg)    SpO2 90%    BMI 28.80 kg/m   Wt Readings from Last 5 Encounters:  02/24/21 162 lb 9.6 oz (73.8 kg)  02/17/21 163 lb 9.6 oz (74.2 kg)  02/08/21 164  lb 12.8 oz (74.8 kg)  11/04/20 162 lb (73.5 kg)  08/02/20 166 lb (75.3 kg)    BMI Readings from Last 5 Encounters:  02/24/21 28.80 kg/m  02/17/21 28.98 kg/m  02/08/21 29.19 kg/m  11/04/20 28.70 kg/m  08/02/20 29.41 kg/m     Physical Exam General: Well-appearing, no acute distress Eyes: EOMI, no icterus Neck: Supple, no JVP Pulmonary: Clear, normal breathing, no crackles Cardiovascular: Regular rate and rhythm, no murmurs Abdomen: Nondistended, bowel sounds present MSK: No synovitis, no joint effusion Neuro: Normal gait, no weakness Psych: Normal mood, full affect   Assessment & Plan:   Chronic hypoxemic respite failure: Suspect related to emphysema seen on CT scan.  Possible basilar fibrosis could not appear classic for UIP but no other clear pattern.  Possibly due to recurrent aspiration in setting of long history of GERD.  Do suspect some element of fibrosis given degree of hypoxemia out of proportion to emphysematous changes.  Short has upcoming repeat low-dose CT in coming weeks.  Consider high-res CT in the coming months for further evaluation.  Full PFTs ordered for further evaluation.  Notably, spirometry appeared normal earlier the month of December 2022. --1 L at rest, 4 L oxygen pulsed via POC maintain oxygen  saturation 90% with exertion, new order for POC ordered  Tobacco abuse in remission: Encouraged to continue low-dose CT scan can for lung cancer screening, has a follow-up scan scheduled for next month to follow nodule seen a few months prior.   Return in about 3 months (around 05/25/2021).   Lanier Clam, MD 02/24/2021

## 2021-03-02 ENCOUNTER — Telehealth: Payer: Self-pay | Admitting: Medical

## 2021-03-02 NOTE — Telephone Encounter (Signed)
Left message for patient to call back and schedule Medicare Annual Wellness Visit (AWV) either virtually or in office. I left my number for patient to call [phone].  Last AWV 02/02/20  please schedule at anytime with health coach  This should be a 45 minute visit.

## 2021-03-08 ENCOUNTER — Ambulatory Visit (HOSPITAL_COMMUNITY): Payer: Medicare HMO | Attending: Cardiology

## 2021-03-08 ENCOUNTER — Other Ambulatory Visit: Payer: Self-pay

## 2021-03-08 DIAGNOSIS — R0600 Dyspnea, unspecified: Secondary | ICD-10-CM | POA: Diagnosis not present

## 2021-03-08 DIAGNOSIS — R0609 Other forms of dyspnea: Secondary | ICD-10-CM | POA: Diagnosis not present

## 2021-03-08 LAB — ECHOCARDIOGRAM COMPLETE
Area-P 1/2: 2.56 cm2
S' Lateral: 2.5 cm

## 2021-03-08 NOTE — Progress Notes (Signed)
Marked difference in R sided parameters compared to 1 year prior. 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 But given such drastic change would worry about development of group 1 disease. Certainly presents a challenge from a potential treatment standpoint. When I met with her she denied any dyspnea which is curious and unexpected given degree of estimated pathology on the RV.  My initial thought is I need to ensure she is wearing O2 and receiving adequate treatment for hypoxemia. Will follow up of PFTs and see about seeing her in f/u sooner than previously planned.   April, can you arrange PFTs and follow up with me next available? Would prefer a 30 min slot as a lot to discuss but would like to see her in the next few weeks so ok for shorter slot if needed. Thanks!

## 2021-03-11 ENCOUNTER — Other Ambulatory Visit: Payer: Self-pay

## 2021-03-11 ENCOUNTER — Ambulatory Visit (INDEPENDENT_AMBULATORY_CARE_PROVIDER_SITE_OTHER): Payer: Medicare HMO

## 2021-03-11 VITALS — BP 160/80 | HR 89 | Temp 97.8°F | Ht 63.0 in | Wt 165.0 lb

## 2021-03-11 DIAGNOSIS — Z0000 Encounter for general adult medical examination without abnormal findings: Secondary | ICD-10-CM | POA: Diagnosis not present

## 2021-03-11 NOTE — Patient Instructions (Signed)
[name] , Thank you for taking time to come for your Medicare Wellness Visit. I appreciate your ongoing commitment to your health goals. Please review the following plan we discussed and let me know if I can assist you in the future.   Screening recommendations/referrals: Colonoscopy: not required Mammogram: completed 08/12/2020 Bone Density: completed 03/01/2016 Recommended yearly ophthalmology/optometry visit for glaucoma screening and checkup Recommended yearly dental visit for hygiene and checkup  Vaccinations: Influenza vaccine: due Pneumococcal vaccine: completed 04/06/2014 Tdap vaccine: due Shingles vaccine: discussed   Covid-19: 01/30/2020, 05/13/2019, 04/22/2019  Advanced directives: copy in chart  Conditions/risks identified: none  Next appointment: Follow up in one year for your annual wellness visit    Preventive Care 44 Years and Older, Female Preventive care refers to lifestyle choices and visits with your health care provider that can promote health and wellness. What does preventive care include? A yearly physical exam. This is also called an annual well check. Dental exams once or twice a year. Routine eye exams. Ask your health care provider how often you should have your eyes checked. Personal lifestyle choices, including: Daily care of your teeth and gums. Regular physical activity. Eating a healthy diet. Avoiding tobacco and drug use. Limiting alcohol use. Practicing safe sex. Taking low-dose aspirin every day. Taking vitamin and mineral supplements as recommended by your health care provider. What happens during an annual well check? The services and screenings done by your health care provider during your annual well check will depend on your age, overall health, lifestyle risk factors, and family history of disease. Counseling  Your health care provider may ask you questions about your: Alcohol use. Tobacco use. Drug use. Emotional well-being. Home  and relationship well-being. Sexual activity. Eating habits. History of falls. Memory and ability to understand (cognition). Work and work Statistician. Reproductive health. Screening  You may have the following tests or measurements: Height, weight, and BMI. Blood pressure. Lipid and cholesterol levels. These may be checked every 5 years, or more frequently if you are over 67 years old. Skin check. Lung cancer screening. You may have this screening every year starting at age 17 if you have a 30-pack-year history of smoking and currently smoke or have quit within the past 15 years. Fecal occult blood test (FOBT) of the stool. You may have this test every year starting at age 38. Flexible sigmoidoscopy or colonoscopy. You may have a sigmoidoscopy every 5 years or a colonoscopy every 10 years starting at age 105. Hepatitis C blood test. Hepatitis B blood test. Sexually transmitted disease (STD) testing. Diabetes screening. This is done by checking your blood sugar (glucose) after you have not eaten for a while (fasting). You may have this done every 1-3 years. Bone density scan. This is done to screen for osteoporosis. You may have this done starting at age 52. Mammogram. This may be done every 1-2 years. Talk to your health care provider about how often you should have regular mammograms. Talk with your health care provider about your test results, treatment options, and if necessary, the need for more tests. Vaccines  Your health care provider may recommend certain vaccines, such as: Influenza vaccine. This is recommended every year. Tetanus, diphtheria, and acellular pertussis (Tdap, Td) vaccine. You may need a Td booster every 10 years. Zoster vaccine. You may need this after age 32. Pneumococcal 13-valent conjugate (PCV13) vaccine. One dose is recommended after age 28. Pneumococcal polysaccharide (PPSV23) vaccine. One dose is recommended after age 68. Talk to your  health care provider  about which screenings and vaccines you need and how often you need them. This information is not intended to replace advice given to you by your health care provider. Make sure you discuss any questions you have with your health care provider. Document Released: 03/12/2015 Document Revised: 11/03/2015 Document Reviewed: 12/15/2014 Elsevier Interactive Patient Education  2017 [place] Prevention in the Home Falls can cause injuries. They can happen to people of all ages. There are many things you can do to make your home safe and to help prevent falls. What can I do on the outside of my home? Regularly fix the edges of walkways and driveways and fix any cracks. Remove anything that might make you trip as you walk through a door, such as a raised step or threshold. Trim any bushes or trees on the path to your home. Use bright outdoor lighting. Clear any walking paths of anything that might make someone trip, such as rocks or tools. Regularly check to see if handrails are loose or broken. Make sure that both sides of any steps have handrails. Any raised decks and porches should have guardrails on the edges. Have any leaves, snow, or ice cleared regularly. Use sand or salt on walking paths during winter. Clean up any spills in your garage right away. This includes oil or grease spills. What can I do in the bathroom? Use night lights. Install grab bars by the toilet and in the tub and shower. Do not use towel bars as grab bars. Use non-skid mats or decals in the tub or shower. If you need to sit down in the shower, use a plastic, non-slip stool. Keep the floor dry. Clean up any water that spills on the floor as soon as it happens. Remove soap buildup in the tub or shower regularly. Attach bath mats securely with double-sided non-slip rug tape. Do not have throw rugs and other things on the floor that can make you trip. What can I do in the bedroom? Use night lights. Make sure  that you have a light by your bed that is easy to reach. Do not use any sheets or blankets that are too big for your bed. They should not hang down onto the floor. Have a firm chair that has side arms. You can use this for support while you get dressed. Do not have throw rugs and other things on the floor that can make you trip. What can I do in the kitchen? Clean up any spills right away. Avoid walking on wet floors. Keep items that you use a lot in easy-to-reach places. If you need to reach something above you, use a strong step stool that has a grab bar. Keep electrical cords out of the way. Do not use floor polish or wax that makes floors slippery. If you must use wax, use non-skid floor wax. Do not have throw rugs and other things on the floor that can make you trip. What can I do with my stairs? Do not leave any items on the stairs. Make sure that there are handrails on both sides of the stairs and use them. Fix handrails that are broken or loose. Make sure that handrails are as long as the stairways. Check any carpeting to make sure that it is firmly attached to the stairs. Fix any carpet that is loose or worn. Avoid having throw rugs at the top or bottom of the stairs. If you do have throw rugs, attach them  to the floor with carpet tape. Make sure that you have a light switch at the top of the stairs and the bottom of the stairs. If you do not have them, ask someone to add them for you. What else can I do to help prevent falls? Wear shoes that: Do not have high heels. Have rubber bottoms. Are comfortable and fit you well. Are closed at the toe. Do not wear sandals. If you use a stepladder: Make sure that it is fully opened. Do not climb a closed stepladder. Make sure that both sides of the stepladder are locked into place. Ask someone to hold it for you, if possible. Clearly mark and make sure that you can see: Any grab bars or handrails. First and last steps. Where the edge of  each step is. Use tools that help you move around (mobility aids) if they are needed. These include: Canes. Walkers. Scooters. Crutches. Turn on the lights when you go into a dark area. Replace any light bulbs as soon as they burn out. Set up your furniture so you have a clear path. Avoid moving your furniture around. If any of your floors are uneven, fix them. If there are any pets around you, be aware of where they are. Review your medicines with your doctor. Some medicines can make you feel dizzy. This can increase your chance of falling. Ask your doctor what other things that you can do to help prevent falls. This information is not intended to replace advice given to you by your health care provider. Make sure you discuss any questions you have with your health care provider. Document Released: 12/10/2008 Document Revised: 07/22/2015 Document Reviewed: 03/20/2014 Elsevier Interactive Patient Education  2017 [employer].

## 2021-03-11 NOTE — Progress Notes (Addendum)
This visit occurred during the SARS-CoV-2 public health emergency.  Safety protocols were in place, including screening questions prior to the visit, additional usage of staff PPE, and extensive cleaning of exam room while observing appropriate contact time as indicated for disinfecting solutions.  Subjective:   Jessica Hart is a 81 y.o. female who presents for Medicare Annual (Subsequent) preventive examination.  Review of Systems     Cardiac Risk Factors include: advanced age (>107mn, >>84women);diabetes mellitus;hypertension     Objective:    Today's Vitals   03/11/21 1541  BP: (!) 160/80  Pulse: 89  Temp: 97.8 F (36.6 C)  TempSrc: Oral  SpO2: 90%  Weight: 165 lb (74.8 kg)  Height: _0  (1.6 m)   Body mass index is 29.23 kg/m.  Advanced Directives 03/11/2021 09/02/2019 08/29/2019 08/20/2019 12/20/2015 05/13/2015 01/11/2015  Does Patient Have a Medical Advance Directive? Yes _1  No  Type of Advance Directive Out of facility DNR (pink MOST or yellow form) - - - - - -  Does patient want to make changes to medical advance directive? - No - Patient declined No - Patient declined - - - -  Would patient like information on creating a medical advance directive? - No - Patient declined - No - Patient declined No - patient declined information No - patient declined information No - patient declined information    Current Medications (verified) Outpatient Encounter Medications as of 03/11/2021  Medication Sig   acetaminophen (TYLENOL) 500 MG tablet Take 2 tablets (1,000 mg total) by mouth every 8 (eight) hours as needed. (Patient taking differently: Take 1,000 mg by mouth 2 (two) times daily.)   amLODipine (NORVASC) 10 MG tablet Take 1 tablet (10 mg total) by mouth daily.   aspirin EC 81 MG tablet Take 1 tablet (81 mg total) by mouth daily. Swallow whole.   Cholecalciferol (D3 5000) 125 MCG (5000 UT) capsule Take 5,000 Units by mouth daily.   clotrimazole-betamethasone  (LOTRISONE) cream Apply 1 application topically daily.   dapagliflozin propanediol (FARXIGA) 5 MG TABS tablet Take 1 tablet (5 mg total) by mouth daily.   Fluticasone-Umeclidin-Vilant (TRELEGY ELLIPTA) 100-62.5-25 MCG/ACT AEPB Inhale 1 puff into the lungs daily.   insulin degludec (TRESIBA FLEXTOUCH) 100 UNIT/ML FlexTouch Pen Inject 15 Units into the skin daily after supper.   Insulin Pen Needle (PEN NEEDLES 5/16") 30G X 8 MM MISC Pen needles used for lantus injections   Insulin Pen Needle (PEN NEEDLES) 32G X 4 MM MISC 1 each by Other route daily.   lipase/protease/amylase (CREON) 36000 UNITS CPEP capsule Take 2 capsules with each meal three times daily  Take 1 capsule with each snack twice daily   losartan (COZAAR) 50 MG tablet Take 1 tablet (50 mg total) by mouth daily.   metFORMIN (GLUCOPHAGE-XR) 750 MG 24 hr tablet TAKE 1 TABLET BY MOUTH EVERY DAY WITH BREAKFAST   Metoprolol Tartrate 37.5 MG TABS Take 1 tablet by mouth 2 (two) times daily.   Misc. Devices MISC SIG: 1 L of Oxygen via Nasal cannula around the clock Dispense Home Oxygen and Portable tank and necessary supplies. Dx: Hypoxemia, Shortness of breath   omeprazole (PRILOSEC) 40 MG capsule TAKE 1 CAPSULE BY MOUTH EVERY DAY   OneTouch Delica Lancets 316XMISC USE TO TEST BLOOD SUGAR ONCE A DAY   ONETOUCH VERIO test strip USE 1 STRIP TO CHECK GLUCOSE ONCE DAILY   vitamin B-12 (CYANOCOBALAMIN) 500 MCG tablet Take 500 mcg by mouth daily.  rosuvastatin (CRESTOR) 20 MG tablet Take 1 tablet (20 mg total) by mouth daily.   No facility-administered encounter medications on file as of 03/11/2021.    Allergies (verified) Lisinopril   History: Past Medical History:  Diagnosis Date   Arthritis    Cataract bilaterl 3 years ago   Coronary artery disease    Diabetes mellitus 02/27/2005   Diabetes mellitus type 2, controlled, without complications ([place]) 08/28/6376   Qualifier: Diagnosis of  By: Drucie Ip     Diverticulosis    Dyspnea     Echocardiogram findings abnormal, without diagnosis 2005   EF 47%, no ischemia, no infarction   Former smoker 01/10/2017   45 pack year history, quit in 2007    GERD (gastroesophageal reflux disease)    Heart murmur    History of bone density study 2006   Lt hip = -1.0, L spine = -1.8    Hyperlipidemia    Hypertension    Past Surgical History:  Procedure Laterality Date   ABDOMINAL HYSTERECTOMY  1989   one ovary remains per patient   CARDIAC CATHETERIZATION  08/20/2019   COLONOSCOPY     CORONARY ARTERY BYPASS GRAFT N/A 09/02/2019   Procedure: CORONARY ARTERY BYPASS GRAFTING (CABG) TIMES THREE USING LEFT INTERNAL MAMMARY ARTERY AND RIGHT GREATER SAPHENOUS VEIN;  Surgeon: Lajuana Matte, MD;  Location: [facility];  Service: Open Heart Surgery;  Laterality: N/A;   ENDOVEIN HARVEST OF GREATER SAPHENOUS VEIN Right 09/02/2019   Procedure: ENDOVEIN HARVEST OF GREATER SAPHENOUS VEIN;  Surgeon: Lajuana Matte, MD;  Location: [facility];  Service: Open Heart Surgery;  Laterality: Right;   EYE SURGERY Bilateral    CATARACT   LEFT HEART CATH AND CORONARY ANGIOGRAPHY N/A 08/20/2019   Procedure: LEFT HEART CATH AND CORONARY ANGIOGRAPHY;  Surgeon: Belva Crome, MD;  Location: [facility];  Service: Cardiovascular;  Laterality: N/A;   TEE WITHOUT CARDIOVERSION N/A 09/02/2019   Procedure: TRANSESOPHAGEAL ECHOCARDIOGRAM (TEE);  Surgeon: Lajuana Matte, MD;  Location: [facility];  Service: Open Heart Surgery;  Laterality: N/A;   TONSILECTOMY, ADENOIDECTOMY, BILATERAL MYRINGOTOMY AND TUBES  1965   TONSILLECTOMY     Family History  Problem Relation Age of Onset   Heart disease Father    Cancer Sister    Tongue cancer Sister    Colon polyps Neg Hx    Rectal cancer Neg Hx    Stomach cancer Neg Hx    Social History   Socioeconomic History   Marital status: Married    Spouse name: Richard   Number of children: 1   Years of education: 12   Highest education level: High school graduate   Occupational History   Occupation: Retire-Textile    Employer: RETIRED  Tobacco Use   Smoking status: Former    Packs/day: 1.00    Years: 45.00    Pack years: 45.00    Types: Cigarettes    Quit date: 07/27/2005    Years since quitting: 15.6   Smokeless tobacco: Former    Quit date: 03/2005  Vaping Use   Vaping Use: Never used  Substance and Sexual Activity   Alcohol use: Not Currently    Comment: beer occasionally    Drug use: No   Sexual activity: Not Currently    Partners: Male    Comment: 1st intercourse-22, partners- 15, married- 72 yrs   Other Topics Concern   Not on file  Social History [place]:  Emergency Contact: husband, Kaydra Borgen   End of Life Plan:    Who lives with you: Lives with husband   Any pets: gold fish   Diet: Patient has a varied diet and reports stuggeling with portion size and ice cream.   Exercise: Patient does not have currently exercise routine.   Seatbelts: Patient reports wearing seatbelt when in vehicle.   Sun Exposure/Protection: Patient reports not using sun protection.   Hobbies: Bingo         Social Determinants of Radio broadcast assistant Strain: Low Risk    Difficulty of Paying Living Expenses: Not hard at all  Food Insecurity: No Food Insecurity   Worried About Charity fundraiser in the Last Year: Never true   Arboriculturist in the Last Year: Never true  Transportation Needs: No Transportation Needs   Lack of Transportation (Medical): No   Lack of Transportation (Non-Medical): No  Physical Activity: Inactive   Days of Exercise per Week: 0 days   Minutes of Exercise per Session: 0 min  Stress: No Stress Concern Present   Feeling of Stress : Not at all  Social Connections: Not on file    Tobacco Counseling Counseling given: Not Answered   Clinical Intake:  Pre-visit preparation completed: Yes  Pain : No/denies pain     Nutritional Status: BMI 25 -29 Overweight Nutritional Risks:  None Diabetes: Yes  How often do you need to have someone help you when you read instructions, pamphlets, or other written materials from your doctor or pharmacy?: 1 - Never What is the last grade level you completed in school?: 12th grade  Diabetic? Yes Nutrition Risk Assessment:  Has the patient had any N/V/D within the last 2 months?  No  Does the patient have any non-healing wounds?  No  Has the patient had any unintentional weight loss or weight gain?  No   Diabetes:  Is the patient diabetic?  Yes  If diabetic, was a CBG obtained today?  No  Did the patient bring in their glucometer from home?  No  How often do you monitor your CBG's? Twice weekly.   Financial Strains and Diabetes Management:  Are you having any financial strains with the device, your supplies or your medication? No .  Does the patient want to be seen by Chronic Care Management for management of their diabetes?  No  Would the patient like to be referred to a Nutritionist or for Diabetic Management?  No   Diabetic Exams:  Diabetic Eye Exam: completed 04/29/2020 Diabetic Foot Exam: Completed next appointment   Interpreter Needed?: No  Information entered by :: NAllen LPN   Activities of Daily Living In your present state of health, do you have any difficulty performing the following activities: 03/11/2021  Hearing? Y  Vision? N  Difficulty concentrating or making decisions? N  Walking or climbing stairs? Y  Dressing or bathing? N  Doing errands, shopping? N  Preparing Food and eating ? N  Using the Toilet? N  In the past six months, have you accidently leaked urine? N  Do you have problems with loss of bowel control? Y  Managing your Medications? N  Managing your Finances? N  Housekeeping or managing your Housekeeping? N  Some recent data might be hidden    Patient Care Team: Tysinger, Camelia Eng, PA-C as PCP - General (Family Medicine) Elouise Munroe, MD as PCP - Cardiology  (Cardiology) Monna Fam, MD (Ophthalmology)  Indicate any  recent Medical Services you may have received from other than [facility] providers in the past year (date may be approximate).     Assessment:   This is a routine wellness examination for Gaz[place].  Hearing/Vision screen Vision Screening - Comments:: Regular eye exams, Dr. Gershon Crane  Dietary issues and exercise activities discussed: Current Exercise Habits: The patient does not participate in regular exercise at present   Goals Addressed             This Visit's Progress    Patient Stated       03/11/2021, wants to live to 89       Depression Screen PHQ 2/9 Scores 03/11/2021 08/02/2020 02/06/2020 02/02/2020 12/09/2019 01/29/2019 01/14/2018  PHQ - 2 Score 0 0 0 0 0 0 0    Fall Risk Fall Risk  03/11/2021 08/02/2020 02/02/2020 12/09/2019 01/29/2019  Falls in the past year? 0 0 0 0 0  Number falls in past yr: - 0 0 0 0  Injury with Fall? - 0 0 0 0  Risk for fall due to : Medication side effect No Fall Risks - Impaired balance/gait -  Follow up Falls evaluation completed;Education provided;Falls prevention discussed Falls evaluation completed - Falls evaluation completed -    FALL RISK PREVENTION PERTAINING TO THE HOME:  Any stairs in or around the home? Yes  If so, are there any without handrails? No  Home free of loose throw rugs in walkways, pet beds, electrical cords, etc? Yes  Adequate lighting in your home to reduce risk of falls? Yes   ASSISTIVE DEVICES UTILIZED TO PREVENT FALLS:  Life alert? No  Use of a cane, walker or w/c? Yes  Grab bars in the bathroom? No  Shower chair or bench in shower? No  Elevated toilet seat or a handicapped toilet? No   TIMED UP AND GO:  Was the test performed? No .    Gait slow and steady with assistive device  Cognitive Function: MMSE - Mini Mental State Exam 10/29/2012 10/26/2011 07/28/2010  Orientation to time _0 Orientation to Place _1 Registration _2 Attention/  Calculation _3 Recall _4 Language- name 2 objects _5 Language- repeat _6 Language- follow 3 step command _7 Language- read & follow direction _8 Write a sentence _9 Copy design _10 Total score _11 6CIT Screen 03/11/2021  What Year? 0 points  What month? 0 points  What time? 0 points  Count back from 20 0 points  Months in reverse 0 points  Repeat phrase 6 points  Total Score 6    Immunizations Immunization History  Administered Date(s) Administered   Fluad Quad(high Dose 65+) 11/11/2018   Influenza Whole 12/11/2007   Influenza, High Dose Seasonal PF 02/29/2016, 01/10/2017, 11/15/2017   Influenza,inj,Quad PF,6+ Mos 10/29/2012, 04/06/2014, 02/19/2015   PFIZER(Purple Top)SARS-COV-2 Vaccination 04/22/2019, 05/13/2019, 01/30/2020   Pneumococcal Conjugate-13 04/06/2014   Pneumococcal Polysaccharide-23 12/11/2007   Td 05/28/2004    TDAP status: Due, Education has been provided regarding the importance of this vaccine. Advised may receive this vaccine at local pharmacy or Health Dept. Aware to provide a copy of the vaccination record if obtained from local pharmacy or Health Dept. Verbalized acceptance and understanding.  Flu Vaccine status: Declined, Education has been provided regarding the importance of this vaccine but patient still  declined. Advised may receive this vaccine at local pharmacy or Health Dept. Aware to provide a copy of the vaccination record if obtained from local pharmacy or Health Dept. Verbalized acceptance and understanding.  Pneumococcal vaccine status: Up to date  Covid-19 vaccine status: Completed vaccines  Qualifies for Shingles Vaccine? Yes   Zostavax completed No   Shingrix Completed?: No.    Education has been provided regarding the importance of this vaccine. Patient has been advised to call insurance company to determine out of pocket expense if they have not yet received this vaccine. Advised may also receive  vaccine at local pharmacy or Health Dept. Verbalized acceptance and understanding.  Screening Tests Health Maintenance  Topic Date Due   COVID-19 Vaccine (4 - Booster for Pfizer series) 03/26/2020   LIPID PANEL  11/11/2020   FOOT EXAM  02/01/2021   Zoster Vaccines- Shingrix (1 of 2) 05/18/2021 (Originally 03/27/1959)   INFLUENZA VACCINE  05/27/2021 (Originally 09/27/2020)   TETANUS/TDAP  02/17/2022 (Originally 05/29/2014)   OPHTHALMOLOGY EXAM  04/29/2021   HEMOGLOBIN A1C  08/09/2021   Pneumonia Vaccine 33+ Years old  Completed   DEXA SCAN  Completed   Hepatitis C Screening  Completed   HPV VACCINES  Aged Out    Health Maintenance  Health Maintenance Due  Topic Date Due   COVID-19 Vaccine (4 - Booster for [place] series) 03/26/2020   LIPID PANEL  11/11/2020   FOOT EXAM  02/01/2021    Colorectal cancer screening: No longer required.   Mammogram status: Completed 08/12/2020. Repeat every year  Bone Density status: Completed 03/01/2016.   Lung Cancer Screening: (Low Dose CT Chest recommended if Age 58-80 years, 30 pack-year currently smoking OR have quit w/in 15years.) does qualify.   Lung Cancer Screening Referral: CT scan 02/14/2021  Additional Screening:  Hepatitis C Screening: does qualify; Completed 02/02/2020  Vision Screening: Recommended annual ophthalmology exams for early detection of glaucoma and other disorders of the eye. Is the patient up to date with their annual eye exam?  Yes  Who is the provider or what is the name of the office in which the patient attends annual eye exams? Dr. Gershon Crane If pt is not established with a provider, would they like to be referred to a provider to establish care? No .   Dental Screening: Recommended annual dental exams for proper oral hygiene  Community Resource Referral / Chronic Care Management: CRR required this visit?  No   CCM required this visit?  No      Plan:     I have personally reviewed and noted the following in  the patients chart:   Medical and social history Use of alcohol, tobacco or illicit drugs  Current medications and supplements including opioid prescriptions.  Functional ability and status Nutritional status Physical activity Advanced directives List of other physicians Hospitalizations, surgeries, and ER visits in previous 12 months Vitals Screenings to include cognitive, depression, and falls Referrals and appointments  In addition, I have reviewed and discussed with patient certain preventive protocols, quality metrics, and best practice recommendations. A written personalized care plan for preventive services as well as general preventive health recommendations were provided to patient.     Kellie Simmering, LPN   1/51/8343   Nurse Notes: oxygen was in the 80s, but got up to 83. Before she left it was still at 34. Encouraged her to use her oxygen. She did not bring it with her today.

## 2021-03-12 DIAGNOSIS — J449 Chronic obstructive pulmonary disease, unspecified: Secondary | ICD-10-CM | POA: Diagnosis not present

## 2021-03-17 ENCOUNTER — Telehealth: Payer: Self-pay | Admitting: Internal Medicine

## 2021-03-17 MED ORDER — METOPROLOL TARTRATE 25 MG PO TABS: 37.5000 mg | ORAL_TABLET | Freq: Two times a day (BID) | ORAL | 3 refills | Status: DC

## 2021-03-17 NOTE — Telephone Encounter (Signed)
Spoke with patient. Change prescription to  25 mg  tablet ( take 1.5 tablet to equal 37.5 mg ) twice a day.  Patient is aware.  90 day supply e-scribed Patient verbalized understanding.

## 2021-03-17 NOTE — Telephone Encounter (Signed)
Pt c/o medication issue:  1. Name of Medication: Metoprolol Tartrate 37.5 MG TABS  2. How are you currently taking this medication (dosage and times per day)? Take 1 tablet by mouth 2 (two) times daily.  3. Are you having a reaction (difficulty breathing--STAT)? no  4. What is your medication issue? Pt states that her insurance does not cover this dosage of metoprolol and would like to know if there is another dosage she can take or alternative.. please advise

## 2021-03-31 ENCOUNTER — Other Ambulatory Visit: Payer: Self-pay

## 2021-03-31 ENCOUNTER — Ambulatory Visit (INDEPENDENT_AMBULATORY_CARE_PROVIDER_SITE_OTHER): Payer: No Typology Code available for payment source | Admitting: Medical

## 2021-03-31 VITALS — BP 122/70 | HR 88 | Temp 97.9°F | Wt 163.0 lb

## 2021-03-31 DIAGNOSIS — J9611 Chronic respiratory failure with hypoxia: Secondary | ICD-10-CM

## 2021-03-31 DIAGNOSIS — R600 Localized edema: Secondary | ICD-10-CM | POA: Insufficient documentation

## 2021-03-31 DIAGNOSIS — E1165 Type 2 diabetes mellitus with hyperglycemia: Secondary | ICD-10-CM

## 2021-03-31 DIAGNOSIS — Z87891 Personal history of nicotine dependence: Secondary | ICD-10-CM | POA: Diagnosis not present

## 2021-03-31 DIAGNOSIS — R609 Edema, unspecified: Secondary | ICD-10-CM | POA: Diagnosis not present

## 2021-03-31 DIAGNOSIS — R918 Other nonspecific abnormal finding of lung field: Secondary | ICD-10-CM

## 2021-03-31 DIAGNOSIS — I10 Essential (primary) hypertension: Secondary | ICD-10-CM | POA: Diagnosis not present

## 2021-03-31 DIAGNOSIS — Z951 Presence of aortocoronary bypass graft: Secondary | ICD-10-CM | POA: Diagnosis not present

## 2021-03-31 DIAGNOSIS — J439 Emphysema, unspecified: Secondary | ICD-10-CM | POA: Diagnosis not present

## 2021-03-31 DIAGNOSIS — R252 Cramp and spasm: Secondary | ICD-10-CM | POA: Insufficient documentation

## 2021-03-31 NOTE — Progress Notes (Signed)
Subjective:  Jessica Hart is a 81 y.o. female who presents for Chief Complaint  Patient presents with   hand swelling    Hand swelling and feet were swelling. Noticed them in the last week     Here for complaint of swelling.  This occurred a few days last week but has seemingly resolved.  She has been having ongoing swelling for weeks or months just in the past week.  She denies chest pain.  No change in urinary output.  No discolored urine.  No palpitations.  She does have chronic shortness of breath and currently seeing a lung doctor and is on oxygen therapy currently.  She has COPD emphysema  The only change recently is that she drank regular Pepsi, several times a day about 3 days last week.  She had not been drinking Pepsi for a few months after changing her diet.  But out of the blue last week she drank Pepsi for 3 days in a row  The swelling last week was in her hands and feet.  It did improve with lying down  She also has some cramps in her muscles  Diabetes-we recently cut back on metformin dose given worsening kidney marker.  She is compliant with metformin new dosing from last visit.  She continues on [place] 12 units daily.  Sugars have been ranging from 89-112 fasting.  Sees cardiology in March.  No other aggravating or relieving factors.    No other c/o.  Past Medical History:  Diagnosis Date   Arthritis    Cataract bilaterl 3 years ago   Coronary artery disease    Diabetes mellitus 02/27/2005   Diabetes mellitus type 2, controlled, without complications ([place]) 10/09/7515   Qualifier: Diagnosis of  By: Drucie Ip     Diverticulosis    Dyspnea    Echocardiogram findings abnormal, without diagnosis 2005   EF 47%, no ischemia, no infarction   Former smoker 01/10/2017   45 pack year history, quit in 2007    GERD (gastroesophageal reflux disease)    Heart murmur    History of bone density study 2006   Lt hip = -1.0, L spine = -1.8    Hyperlipidemia     Hypertension    Current Outpatient Medications on File Prior to Visit  Medication Sig Dispense Refill   acetaminophen (TYLENOL) 500 MG tablet Take 2 tablets (1,000 mg total) by mouth every 8 (eight) hours as needed. (Patient taking differently: Take 1,000 mg by mouth 2 (two) times daily.) 30 tablet 2   amLODipine (NORVASC) 10 MG tablet Take 1 tablet (10 mg total) by mouth daily. 90 tablet 1   aspirin EC 81 MG tablet Take 1 tablet (81 mg total) by mouth daily. Swallow whole. 90 tablet 3   Cholecalciferol (D3 5000) 125 MCG (5000 UT) capsule Take 5,000 Units by mouth daily.     clotrimazole-betamethasone (LOTRISONE) cream Apply 1 application topically daily. 30 g 0   dapagliflozin propanediol (FARXIGA) 5 MG TABS tablet Take 1 tablet (5 mg total) by mouth daily. 90 tablet 1   Fluticasone-Umeclidin-Vilant (TRELEGY ELLIPTA) 100-62.5-25 MCG/ACT AEPB Inhale 1 puff into the lungs daily. 1 each 2   insulin degludec (TRESIBA FLEXTOUCH) 100 UNIT/ML FlexTouch Pen Inject 15 Units into the skin daily after supper. 3 mL 2   Insulin Pen Needle (PEN NEEDLES 5/16") 30G X 8 MM MISC Pen needles used for lantus injections 100 each 3   Insulin Pen Needle (PEN NEEDLES) 32G X 4 MM  MISC 1 each by Other route daily. 100 each 1   lipase/protease/amylase (CREON) 36000 UNITS CPEP capsule Take 2 capsules with each meal three times daily  Take 1 capsule with each snack twice daily 240 capsule 11   losartan (COZAAR) 50 MG tablet Take 1 tablet (50 mg total) by mouth daily. 90 tablet 1   metFORMIN (GLUCOPHAGE-XR) 750 MG 24 hr tablet TAKE 1 TABLET BY MOUTH EVERY DAY WITH BREAKFAST 90 tablet 0   metoprolol tartrate (LOPRESSOR) 25 MG tablet Take 1.5 tablets (37.5 mg total) by mouth 2 (two) times daily. 270 tablet 3   omeprazole (PRILOSEC) 40 MG capsule TAKE 1 CAPSULE BY MOUTH EVERY DAY 90 capsule 1   rosuvastatin (CRESTOR) 20 MG tablet Take 1 tablet (20 mg total) by mouth daily. 90 tablet 3   vitamin B-12 (CYANOCOBALAMIN) 500 MCG  tablet Take 500 mcg by mouth daily.     Misc. Devices MISC SIG: 1 L of Oxygen via Nasal cannula around the clock Dispense Home Oxygen and Portable tank and necessary supplies. Dx: Hypoxemia, Shortness of breath 1 Device 0   OneTouch Delica Lancets 00F MISC USE TO TEST BLOOD SUGAR ONCE A DAY 100 each 1   ONETOUCH VERIO test strip USE 1 STRIP TO CHECK GLUCOSE ONCE DAILY 100 strip 1   No current facility-administered medications on file prior to visit.     The following portions of the patient's history were reviewed and updated as appropriate: allergies, current medications, past family history, past medical history, past social history, past surgical history and problem list.  ROS Otherwise as in subjective above  Objective: BP 122/70    Pulse 88    Temp 97.9 F (36.6 C)    Wt 163 lb (73.9 kg)    BMI 28.87 kg/m   General appearance: alert, no distress, well developed, well nourished Neck: supple, no lymphadenopathy, no thyromegaly, no masses Heart: RRR, normal S1, S2, no murmurs Lungs: CTA bilaterally, no wheezes, rhonchi, or rales Pulses: 2+ radial pulses, 2+ pedal pulses, normal cap refill Ext: no edema   Assessment: Encounter Diagnoses  Name Primary?   Edema, unspecified type Yes   Muscle cramp    Chronic hypoxemic respiratory failure ([place])    Pulmonary emphysema, unspecified emphysema type ([facility])    Type 2 diabetes mellitus with hyperglycemia, unspecified whether long term insulin use ([facility])    S/P CABG x 3    Pulmonary nodules    Primary hypertension    Former smoker      Plan: Edema - I suspect this is transient and currently resolved due to excess salt and sugar from 3 days of pepsi cola beverages last week.  No edema today on exam.  Givne cramps, labs as below.  Avoid pepsi, high salt foods and beverages.  Currently under care of pulmonology, on oxygen, pending follow up with pulmonology.  Reviewed 02/24/21 notes from pulmonology.  Recent CT shows emphysema.    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  Goal to maintain oxygen saturation 90% with 1 L at rest.    F/u with pulmonology and cardiology as planned  Diabetes - home sugars stable.  Continue current medication  Memory was seen today for hand swelling.  Diagnoses and all orders for this visit:  Edema, unspecified type -     Basic metabolic panel -     Magnesium  Muscle cramp -     Basic metabolic panel -     Magnesium  Chronic hypoxemic respiratory failure (H[facility])  Pulmonary emphysema,  unspecified emphysema type ([place])  Type 2 diabetes mellitus with hyperglycemia, unspecified whether long term insulin use ([facility])  S/P CABG x 3  Pulmonary nodules  Primary hypertension  Former smoker    Follow up: pending labs

## 2021-04-01 LAB — BASIC METABOLIC PANEL
BUN/Creatinine Ratio: 12 (ref 12–28)
BUN: 9 mg/dL (ref 8–27)
CO2: 22 mmol/L (ref 20–29)
Calcium: 9.2 mg/dL (ref 8.7–10.3)
Chloride: 106 mmol/L (ref 96–106)
Creatinine, Ser: 0.77 mg/dL (ref 0.57–1.00)
Glucose: 176 mg/dL — ABNORMAL HIGH (ref 70–99)
Potassium: 4.1 mmol/L (ref 3.5–5.2)
Sodium: 144 mmol/L (ref 134–144)
eGFR: 78 mL/min/{1.73_m2} (ref >59)

## 2021-04-01 LAB — MAGNESIUM: Magnesium: 2.2 mg/dL (ref 1.6–2.3)

## 2021-04-06 ENCOUNTER — Other Ambulatory Visit: Payer: Self-pay

## 2021-04-06 ENCOUNTER — Ambulatory Visit (INDEPENDENT_AMBULATORY_CARE_PROVIDER_SITE_OTHER): Payer: No Typology Code available for payment source | Admitting: Pulmonary Disease

## 2021-04-06 ENCOUNTER — Encounter: Payer: Self-pay | Admitting: Pulmonary Disease

## 2021-04-06 VITALS — BP 124/68 | HR 94 | Temp 98.5°F | Ht 63.0 in | Wt 162.0 lb

## 2021-04-06 DIAGNOSIS — E8779 Other fluid overload: Secondary | ICD-10-CM | POA: Diagnosis not present

## 2021-04-06 DIAGNOSIS — J439 Emphysema, unspecified: Secondary | ICD-10-CM

## 2021-04-06 DIAGNOSIS — J849 Interstitial pulmonary disease, unspecified: Secondary | ICD-10-CM

## 2021-04-06 DIAGNOSIS — R0602 Shortness of breath: Secondary | ICD-10-CM | POA: Diagnosis not present

## 2021-04-06 LAB — PULMONARY FUNCTION TEST
DL/VA % pred: 55 %
DL/VA: 2.3 ml/min/mmHg/L
DLCO unc % pred: 36 %
DLCO unc: 6.61 ml/min/mmHg
FEF 25-75 Post: 1.35 L/sec
FEF 25-75 Pre: 1.87 L/sec
FEF2575-%Change-Post: -27 %
FEF2575-%Pred-Post: 105 %
FEF2575-%Pred-Pre: 146 %
FEV1-%Change-Post: -4 %
FEV1-%Pred-Post: 91 %
FEV1-%Pred-Pre: 96 %
FEV1-Post: 1.37 L
FEV1-Pre: 1.44 L
FEV1FVC-%Change-Post: 0 %
FEV1FVC-%Pred-Pre: 109 %
FEV6-%Change-Post: -5 %
FEV6-%Pred-Post: 88 %
FEV6-%Pred-Pre: 93 %
FEV6-Post: 1.64 L
FEV6-Pre: 1.74 L
FEV6FVC-%Pred-Post: 104 %
FEV6FVC-%Pred-Pre: 104 %
FVC-%Change-Post: -5 %
FVC-%Pred-Post: 84 %
FVC-%Pred-Pre: 89 %
FVC-Post: 1.64 L
FVC-Pre: 1.74 L
Post FEV1/FVC ratio: 84 %
Post FEV6/FVC ratio: 100 %
Pre FEV1/FVC ratio: 83 %
Pre FEV6/FVC Ratio: 100 %
RV % pred: 60 %
RV: 1.4 L
TLC % pred: 75 %
TLC: 3.72 L

## 2021-04-06 MED ORDER — FUROSEMIDE 40 MG PO TABS: 40.0000 mg | ORAL_TABLET | Freq: Every day | ORAL | 1 refills | Status: DC

## 2021-04-06 NOTE — Patient Instructions (Signed)
Full PFT performed today.

## 2021-04-06 NOTE — Patient Instructions (Signed)
Nice to see you again  I think your stress on your heart because her oxygen is low and continues to be low most likely.  Use 4 L of oxygen with exertion.  This means walking around the house, going to the grocery store anytime you leave the house.  It may be easier just to leave your home concentrator on 4 L.  At rest 1 L is enough but I worry you will forget to increase her oxygen to 4 L when you are moving around.  I will order a sleep study to be done at home to see if you have sleep apnea.  This can contribute to stress on the heart as well.  If sleep apnea is present, we will need to bring you in to the hospital or another laboratory to do a titration of CPAP and oxygen.  I have started the medicine for the fluid overload.  Take Lasix 40 mg daily every day.  Please come to the clinic on Tuesday, February 14 around 930 to 10 AM for labs to make sure your kidney function and electrolytes are safe on this new medication.  Return to clinic in 4 weeks or sooner as needed with Dr. Silas Flood

## 2021-04-06 NOTE — Progress Notes (Signed)
Full PFT performed today. °

## 2021-04-08 NOTE — Progress Notes (Signed)
_0  ID: Jessica Hart, female    DOB: 1940-06-08, 81 y.o.   MRN: [id number]  Chief Complaint  Patient presents with   Follow-up    Follow up. Full pft done prior to appt. Pt states that her breathing is doing well since last visit.     Referring provider: Carlena Hurl, PA-C  HPI:   81 y.o. woman whom we are seeing in follow up for evaluation of chronic hypoxemic respiratory failure as well as newly discovered elevated pulmonary pressures on TTE.  Patient presents for follow-up following recent TTE.  Stable changes compared to prior with RV enlargement, RA enlargement, markedly elevated PASP.  She denies any significant worsening dyspnea.  Symptoms not too bad.  Really does not feel that bad.  Discussed her oxygen use.  At last visit 1 L at rest, 4 L pulsed with exertion.  She is not increasing her oxygen when she moves around.  Sounds like even at rest sometimes is not using.  Suspect she is still continue to have significant desaturation.  Counseled at length the importance of maintaining her oxygen saturation and increasing oxygen when exerting herself.  Counseled just to keep it on 4 L at all times if she cannot remember to turn oxygen up.  She expressed understanding as did husband in the room.  Discussed need for further work-up for presumed pulm hypertension.  Discussed role and rationale of diuretic therapy given her swelling which he expressed understanding.  Reviewed PFTs in detail.  See below for interpretation and review.  HPI at initial visit: Was in usual state of health.  Was seen PCP and routine follow-up.  Noted to be hypoxemic work-up, rooming.  Noted little bit of cough for 3 days.  She was placed on 1 L of oxygen.  Came up to 92%.  She was placed on Trelegy.  She was referred to pulmonary.  Home oxygen was delivered.  She represented to PCP about 10 days later.  Noted to be at 87% on 1 L with exertion although no changes were made to oxygen prescription.  She  denies significant dyspnea, nothing too significant.  Able to do tasks he needs to do.  Reviewed multiple CT scans that shows mild emphysematous changes primarily in the upper lobes with interlobular septal thickening and bronchiectasis in bilateral lower lobes without clear honeycombing on my interpretation.  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  MMRC: No flowsheet data found.  Epworth:  No flowsheet data found.  Tests:   FENO:  No results found for: NITRICOXIDE  PFT: PFT Results Latest Ref Rng & Units 04/06/2021  FVC-Pre L 1.74  FVC-Predicted Pre % 89  FVC-Post L 1.64  FVC-Predicted Post % 84  Pre FEV1/FVC % % 83  Post FEV1/FCV % % 84  FEV1-Pre L 1.44  FEV1-Predicted Pre % 96  FEV1-Post L 1.37  DLCO uncorrected ml/min/mmHg 6.61  DLCO UNC% % 36  DLVA Predicted % 55  TLC L 3.72  TLC % Predicted % 75  RV % Predicted % 60  Personally reviewed interpret spirometry suggestive of moderate to mild restriction versus gas trapping, no bronchodilator response, no fixed obstruction, TLC moderately reduced, DLCO severely reduced  WALK:  No flowsheet  data found.  Imaging: Personally reviewed and as per EMR discussion of this note No results found.  Lab Results: Personally reviewed, no anemia, no polycythemia CBC    Component Value Date/Time   WBC 9.0 02/08/2021 1205   WBC 14.2 (H) 09/05/2019 0352   RBC 5.34 (H) 02/08/2021 1205   RBC 4.40 09/05/2019 0352   HGB 14.5 02/08/2021 1205   HCT 45.6 02/08/2021 1205   PLT 286 02/08/2021 1205   MCV 85 02/08/2021 1205   MCH 27.2 02/08/2021 1205   MCH 28.6 09/05/2019 0352   MCHC 31.8 02/08/2021 1205    MCHC 32.6 09/05/2019 0352   RDW 14.6 02/08/2021 1205   LYMPHSABS 1.9 02/08/2021 1205   MONOABS 776 08/29/2016 0920   EOSABS 0.1 02/08/2021 1205   BASOSABS 0.1 02/08/2021 1205    BMET    Component Value Date/Time   NA 144 03/31/2021 1131   K 4.1 03/31/2021 1131   CL 106 03/31/2021 1131   CO2 22 03/31/2021 1131   GLUCOSE 176 (H) 03/31/2021 1131   GLUCOSE 101 (H) 09/05/2019 0352   BUN 9 03/31/2021 1131   CREATININE 0.77 03/31/2021 1131   CREATININE 0.69 01/10/2017 1235   CALCIUM 9.2 03/31/2021 1131   GFRNONAA 84 02/02/2020 1108   GFRNONAA 86 01/10/2017 1235   GFRAA 97 02/02/2020 1108   GFRAA 99 01/10/2017 1235    BNP No results found for: BNP  ProBNP No results found for: PROBNP  Specialty Problems       Pulmonary Problems   Acute cough   SOB (shortness of breath)   Pulmonary emphysema (HCC)   Pulmonary nodules    Allergies  Allergen Reactions   Lisinopril Itching and Cough    Immunization History  Administered Date(s) Administered   Fluad Quad(high Dose 65+) 11/11/2018   Influenza Whole 12/11/2007   Influenza, High Dose Seasonal PF 02/29/2016, 01/10/2017, 11/15/2017   Influenza,inj,Quad PF,6+ Mos 10/29/2012, 04/06/2014, 02/19/2015   PFIZER(Purple Top)SARS-COV-2 Vaccination 04/22/2019, 05/13/2019, 01/30/2020   Pneumococcal Conjugate-13 04/06/2014   Pneumococcal Polysaccharide-23 12/11/2007   Td 05/28/2004    Past Medical History:  Diagnosis Date   Arthritis    Cataract bilaterl 3 years ago   Coronary artery disease    Diabetes mellitus 02/27/2005   Diabetes mellitus type 2, controlled, without complications ([place]) 3/53/6144   Qualifier: Diagnosis of  By: Drucie Ip     Diverticulosis    Dyspnea    Echocardiogram findings abnormal, without diagnosis 2005   EF 47%, no ischemia, no infarction   Former smoker 01/10/2017   45 pack year history, quit in 2007    GERD (gastroesophageal reflux disease)    Heart murmur    History of bone density  study 2006   Lt hip = -1.0, L spine = -1.8    Hyperlipidemia    Hypertension     Tobacco History: Social History   Tobacco Use  Smoking Status Former   Packs/day: 1.00   Years: 45.00   Pack years: 45.00   Types: Cigarettes   Quit date: 07/27/2005   Years since quitting: 15.7  Smokeless Tobacco Former   Quit date: 03/2005   Counseling given: Not Answered   Continue to not smoke  Outpatient Encounter Medications as of 04/06/2021  Medication Sig   acetaminophen (TYLENOL) 500 MG tablet Take 2 tablets (1,000 mg total) by mouth every 8 (eight) hours as needed. (Patient taking differently: Take 1,000 mg by mouth 2 (two) times daily.)   amLODipine (NORVASC) 10 MG tablet  Take 1 tablet (10 mg total) by mouth daily.   aspirin EC 81 MG tablet Take 1 tablet (81 mg total) by mouth daily. Swallow whole.   Cholecalciferol (D3 5000) 125 MCG (5000 UT) capsule Take 5,000 Units by mouth daily.   clotrimazole-betamethasone (LOTRISONE) cream Apply 1 application topically daily.   dapagliflozin propanediol (FARXIGA) 5 MG TABS tablet Take 1 tablet (5 mg total) by mouth daily.   Fluticasone-Umeclidin-Vilant (TRELEGY ELLIPTA) 100-62.5-25 MCG/ACT AEPB Inhale 1 puff into the lungs daily.   furosemide (LASIX) 40 MG tablet Take 1 tablet (40 mg total) by mouth daily.   insulin degludec (TRESIBA FLEXTOUCH) 100 UNIT/ML FlexTouch Pen Inject 15 Units into the skin daily after supper.   Insulin Pen Needle (PEN NEEDLES 5/16") 30G X 8 MM MISC Pen needles used for lantus injections   Insulin Pen Needle (PEN NEEDLES) 32G X 4 MM MISC 1 each by Other route daily.   lipase/protease/amylase (CREON) 36000 UNITS CPEP capsule Take 2 capsules with each meal three times daily  Take 1 capsule with each snack twice daily   losartan (COZAAR) 50 MG tablet Take 1 tablet (50 mg total) by mouth daily.   metFORMIN (GLUCOPHAGE-XR) 750 MG 24 hr tablet TAKE 1 TABLET BY MOUTH EVERY DAY WITH BREAKFAST   metoprolol tartrate (LOPRESSOR) 25  MG tablet Take 1.5 tablets (37.5 mg total) by mouth 2 (two) times daily.   Misc. Devices MISC SIG: 1 L of Oxygen via Nasal cannula around the clock Dispense Home Oxygen and Portable tank and necessary supplies. Dx: Hypoxemia, Shortness of breath   omeprazole (PRILOSEC) 40 MG capsule TAKE 1 CAPSULE BY MOUTH EVERY DAY   OneTouch Delica Lancets 22G MISC USE TO TEST BLOOD SUGAR ONCE A DAY   ONETOUCH VERIO test strip USE 1 STRIP TO CHECK GLUCOSE ONCE DAILY   rosuvastatin (CRESTOR) 20 MG tablet Take 1 tablet (20 mg total) by mouth daily.   vitamin B-12 (CYANOCOBALAMIN) 500 MCG tablet Take 500 mcg by mouth daily.   No facility-administered encounter medications on file as of 04/06/2021.     Review of Systems  Review of Systems  No chest pain with exertion.  No orthopnea or PND.  Conference review of systems otherwise negative. Physical Exam  BP 124/68 (BP Location: Left Arm, Patient Position: Sitting, Cuff Size: Normal)    Pulse 94    Temp 98.5 F (36.9 C) (Oral)    Ht _0  (1.6 m)    Wt 162 lb (73.5 kg)    SpO2 95%    BMI 28.70 kg/m   Wt Readings from Last 5 Encounters:  04/06/21 162 lb (73.5 kg)  03/31/21 163 lb (73.9 kg)  03/11/21 165 lb (74.8 kg)  02/24/21 162 lb 9.6 oz (73.8 kg)  02/17/21 163 lb 9.6 oz (74.2 kg)    BMI Readings from Last 5 Encounters:  04/06/21 28.70 kg/m  03/31/21 28.87 kg/m  03/11/21 29.23 kg/m  02/24/21 28.80 kg/m  02/17/21 28.98 kg/m     Physical Exam General: Well-appearing, no acute distress Eyes: EOMI, no icterus Neck: Supple, JVP 4 to 5 cm above clavicle with head of bed upright Pulmonary: Clear, normal breathing, no crackles Cardiovascular: Regular rate and rhythm, no murmurs MSK: No synovitis, no joint effusion Neuro: Normal gait, no weakness Psych: Normal mood, full affect   Assessment & Plan:   Chronic hypoxemic respiratory failure: Suspect related to emphysema seen on CT scan.  Possible basilar fibrosis could not appear classic for  UIP but no other  clear pattern.  Possibly due to recurrent aspiration in setting of long history of GERD.  Do suspect some element of fibrosis given degree of hypoxemia out of proportion to emphysematous changes.  CT high-resolution ordered.  Also complains of VQ mismatch and recently discovered likely pulmonary hypertension on TTE.  It was reiterated again how she should be using oxygen.  1 L at rest, 4 L with exertion.  She is not increasing oxygen with exertion as instructed.  At times sounds like not using oxygen.  POC order was placed at last visit but [facility] told her she could not get it until she uses multiple tanks.  This does not make sense.  We will look into getting POC for her to keep her more active.  Tobacco abuse in remission: Encouraged to continue low-dose CT scan can for lung cancer screening, most recent scan 01/2021 reviewed with lung RADS 6, repeat 42-monthscan ordered.  Presumed pulmonary hypertension: Based on TTE with significant findings of RV enlargement, mildly reduced RV function, mildly dilated RA all new compared to 2021.  Suspect largely driven by group 3 disease and hypoxemia she is not using oxygen as prescribed.  Possible group 2 disease given diastolic dysfunction although not sure how big contributor this is.  No history of VTE/PE to suggest group 4 disease.  Will obtain PSG for further evaluation and possible treatment of group 3 disease.  She is volume overloaded on exam today with elevated JVP and lower extremity swelling.  Start Lasix 40 mg daily.  Recent BMP reviewed.  Repeat labs early next week, she was instructed to come have these drawn to ensure safety of kidney function and electrolytes.  Requiring high intensity drug monitoring.   Return in about 4 weeks (around 05/04/2021).   MLanier Clam MD 04/08/2021

## 2021-04-11 ENCOUNTER — Telehealth: Payer: Self-pay | Admitting: Medical

## 2021-04-11 ENCOUNTER — Other Ambulatory Visit: Payer: Self-pay | Admitting: Medical

## 2021-04-11 NOTE — Telephone Encounter (Signed)
Received Fax Refill request from [facility] Metformin HCL 1,000 MG Tablet Quantity:180

## 2021-04-12 ENCOUNTER — Other Ambulatory Visit (INDEPENDENT_AMBULATORY_CARE_PROVIDER_SITE_OTHER): Payer: No Typology Code available for payment source

## 2021-04-12 DIAGNOSIS — E8779 Other fluid overload: Secondary | ICD-10-CM | POA: Diagnosis not present

## 2021-04-13 LAB — BASIC METABOLIC PANEL
BUN: 12 mg/dL (ref 6–23)
CO2: 29 mEq/L (ref 19–32)
Calcium: 9.2 mg/dL (ref 8.4–10.5)
Chloride: 104 mEq/L (ref 96–112)
Creatinine, Ser: 0.99 mg/dL (ref 0.40–1.20)
GFR: 54.41 mL/min — ABNORMAL LOW (ref >60.00)
Glucose, Bld: 227 mg/dL — ABNORMAL HIGH (ref 70–99)
Potassium: 3.6 mEq/L (ref 3.5–5.1)
Sodium: 140 mEq/L (ref 135–145)

## 2021-04-13 LAB — MAGNESIUM: Magnesium: 1.9 mg/dL (ref 1.5–2.5)

## 2021-04-19 ENCOUNTER — Telehealth: Payer: Self-pay | Admitting: Medical

## 2021-04-19 DIAGNOSIS — K219 Gastro-esophageal reflux disease without esophagitis: Secondary | ICD-10-CM

## 2021-04-19 MED ORDER — METFORMIN HCL ER 750 MG PO TB24: ORAL_TABLET | ORAL | 1 refills | Status: DC

## 2021-04-19 MED ORDER — OMEPRAZOLE 40 MG PO CPDR: DELAYED_RELEASE_CAPSULE | ORAL | 1 refills | Status: DC

## 2021-04-19 NOTE — Telephone Encounter (Signed)
Pt needed refill for metformin, and omeprazole. Given samples of tresiba, and trelegy

## 2021-04-19 NOTE — Telephone Encounter (Signed)
Pt called requesting refill on Metformin    She is low on refills on other meds but I told her she is probably due for an Appointment.    When does she need to be seen again?

## 2021-04-21 ENCOUNTER — Other Ambulatory Visit: Payer: No Typology Code available for payment source

## 2021-04-29 ENCOUNTER — Telehealth: Payer: Self-pay | Admitting: Pulmonary Disease

## 2021-04-29 DIAGNOSIS — J439 Emphysema, unspecified: Secondary | ICD-10-CM

## 2021-04-29 NOTE — Telephone Encounter (Signed)
Placed new oxygen order per Dr Silas Flood for 4L.  ? ?DME: [facility] ? ?Order printed and faxed to fax number provided  ? ?Nothing further  ?

## 2021-05-03 DIAGNOSIS — Z794 Long term (current) use of insulin: Secondary | ICD-10-CM | POA: Diagnosis not present

## 2021-05-03 DIAGNOSIS — H524 Presbyopia: Secondary | ICD-10-CM | POA: Diagnosis not present

## 2021-05-03 DIAGNOSIS — Z961 Presence of intraocular lens: Secondary | ICD-10-CM | POA: Diagnosis not present

## 2021-05-03 DIAGNOSIS — E119 Type 2 diabetes mellitus without complications: Secondary | ICD-10-CM | POA: Diagnosis not present

## 2021-05-03 DIAGNOSIS — Z7984 Long term (current) use of oral hypoglycemic drugs: Secondary | ICD-10-CM | POA: Diagnosis not present

## 2021-05-03 LAB — HM DIABETES EYE EXAM

## 2021-05-04 DIAGNOSIS — H524 Presbyopia: Secondary | ICD-10-CM | POA: Diagnosis not present

## 2021-05-04 DIAGNOSIS — Z0100 Encounter for examination of eyes and vision without abnormal findings: Secondary | ICD-10-CM | POA: Diagnosis not present

## 2021-05-05 ENCOUNTER — Other Ambulatory Visit: Payer: Self-pay | Admitting: Medical

## 2021-05-05 ENCOUNTER — Encounter: Payer: Self-pay | Admitting: Internal Medicine

## 2021-05-05 NOTE — Telephone Encounter (Signed)
Spoke to patient. She still has rash but right now she is ok with medication and doesn't need a refill at this time. She has seen some improvements ?

## 2021-05-06 ENCOUNTER — Encounter: Payer: Self-pay | Admitting: Pulmonary Disease

## 2021-05-06 ENCOUNTER — Other Ambulatory Visit: Payer: Self-pay

## 2021-05-06 ENCOUNTER — Ambulatory Visit (INDEPENDENT_AMBULATORY_CARE_PROVIDER_SITE_OTHER): Payer: No Typology Code available for payment source | Admitting: Pulmonary Disease

## 2021-05-06 VITALS — BP 132/68 | HR 84 | Temp 97.6°F | Wt 162.0 lb

## 2021-05-06 DIAGNOSIS — I270 Primary pulmonary hypertension: Secondary | ICD-10-CM

## 2021-05-06 NOTE — Patient Instructions (Signed)
Nice to see you again ? ?We will walk today to see if you qualify for POC machine or portable oxygen concentrator.  We will send new oxygen orders to a new oxygen company. ? ?I will see you back in a few weeks after CT scan.  We can discuss neck steps at that time.  In addition, I ordered a sleep study to be performed in the lab or the hospital to see if sleep apnea is contributing to the pulmonary hypertension. ? ?Return to clinic in 4 weeks or sooner as needed with Dr. Silas Flood ?

## 2021-05-10 ENCOUNTER — Telehealth: Payer: Self-pay | Admitting: Pulmonary Disease

## 2021-05-10 NOTE — Telephone Encounter (Signed)
Waiting for response per PCCs from [place] for her oxygen. [facility] called her and reassured her that they would not take her oxygen until her new oxygen service is established.  ? ?Called and told patient that we would give her a call back as soon as we here something back from the new oxygen service.  ? ?Patient understood. And waiting for call back  ?

## 2021-05-10 NOTE — Progress Notes (Deleted)
?Cardiology Office Note:   ? ?Date:  05/10/2021  ? ?ID:  Jessica Hart, DOB 09-16-1940, MRN [id number] ? ?PCP:  Carlena Hurl, PA-C  ?Cardiologist:  Elouise Munroe, MD  ?Electrophysiologist:  None  ? ?Referring MD: Girtha Rm, PA-C  ? ?Chief Complaint/Reason for Referral: ?CAD s/p CABG ? ?History of Present Illness:   ? ?Jessica Hart is a 81 y.o. female with a history of diabetes mellitus type 2, hypertension, hyperlipidemia, GERD, remote tobacco abuse who presents for follow-up after CABG x3 LIMA to LAD, reverse saphenous vein graft to PDA and OM1.  She was initially referred for coronary angiography given progressive chest pain and infarct seen on stress test inferior and inferolateral with nuclear stress EF of 36%.  Coronary angiography showed severe three-vessel coronary artery obstructive disease with severe three-vessel calcification.  Coronary artery bypass grafting performed on 09/02/2019. ? ?Recently had an echo and seen by Pulmonary for dyspnea. PHTN noted on echo with elevated RVSP and RV dysfunction and enlargement, and evalulation with pulmonary suggets chronic hypoxemic resp failure likely secondary to prior smoking and emphysema with predominantly group 3 PHTN. Also felt to have volume overload and was diuresed by Dr. Silas Flood, possible Group 2 component. Noted to have grade 1 diastolic dysfunction on recent echo.  ? ?She is feeling *** well today, and volume status appears improved***. Using oxygen 4L per pulmonary. Intended to have sleep study, this has ***been completed. Currently on lasix 40 mg daily ***. ?  ? ?Prior visit: ?An echo was performed on 08-29-19 demonstrating mild mitral valve regurgitation and estimated ejection fraction 40 to 45% with regional wall motion abnormalities. Most recent LVEF felt to be normal, 60-65%. Mild LVH, and I am unable to reproduce the readers 18 mm measurement. I have independently reviewed the images multiple times. I think strain was not  performed well therefore cannot be accurately reported (may be falsely low values due to suboptimal tracking). Discussed in great detail with patient and we decided in the absence of concerning strain patterns, no syncope or signs of arrhythmia, there is no strong indication to screen for HCM or amyloid at this time. ? ?She notes walking makes her feel better. When active she will have mild SOB but she feels better after moving later in the day. No CP. Occasional shooting left leg cramp noted at last visit which she still has. Reviewed that this may be due to statin therapy and offered CVRR visit for consideration of PCSK9I. She defers at this time and would like to try topical therapies and walking more. Will consider Mg supplement but already has some increased bowel movements from metformin. She is overall tolerating crestor 20 mg daily well.  ?  ? ?Past Medical History:  ?Diagnosis Date  ? Arthritis   ? Cataract bilaterl 3 years ago  ? Coronary artery disease   ? Diabetes mellitus 02/27/2005  ? Diabetes mellitus type 2, controlled, without complications ([place]) 4/53/6468  ? Qualifier: Diagnosis of  By: Drucie Ip    ? Diverticulosis   ? Dyspnea   ? Echocardiogram findings abnormal, without diagnosis 2005  ? EF 47%, no ischemia, no infarction  ? Former smoker 01/10/2017  ? 45 pack year history, quit in 2007   ? GERD (gastroesophageal reflux disease)   ? Heart murmur   ? History of bone density study 2006  ? Lt hip = -1.0, L spine = -1.8   ? Hyperlipidemia   ? Hypertension   ? ? ?  Past Surgical History:  ?Procedure Laterality Date  ? ABDOMINAL HYSTERECTOMY  1989  ? one ovary remains per patient  ? CARDIAC CATHETERIZATION  08/20/2019  ? COLONOSCOPY    ? CORONARY ARTERY BYPASS GRAFT N/A 09/02/2019  ? Procedure: CORONARY ARTERY BYPASS GRAFTING (CABG) TIMES THREE USING LEFT INTERNAL MAMMARY ARTERY AND RIGHT GREATER SAPHENOUS VEIN;  Surgeon: Lajuana Matte, MD;  Location: [facility];  Service: Open Heart Surgery;   Laterality: N/A;  ? ENDOVEIN HARVEST OF GREATER SAPHENOUS VEIN Right 09/02/2019  ? Procedure: ENDOVEIN HARVEST OF GREATER SAPHENOUS VEIN;  Surgeon: Lajuana Matte, MD;  Location: [facility];  Service: Open Heart Surgery;  Laterality: Right;  ? EYE SURGERY Bilateral   ? CATARACT  ? LEFT HEART CATH AND CORONARY ANGIOGRAPHY N/A 08/20/2019  ? Procedure: LEFT HEART CATH AND CORONARY ANGIOGRAPHY;  Surgeon: Belva Crome, MD;  Location: [facility];  Service: Cardiovascular;  Laterality: N/A;  ? TEE WITHOUT CARDIOVERSION N/A 09/02/2019  ? Procedure: TRANSESOPHAGEAL ECHOCARDIOGRAM (TEE);  Surgeon: Lajuana Matte, MD;  Location: [facility];  Service: Open Heart Surgery;  Laterality: N/A;  ? TONSILECTOMY, ADENOIDECTOMY, BILATERAL MYRINGOTOMY AND TUBES  1965  ? TONSILLECTOMY    ? ? ?Current Medications: ?No outpatient medications have been marked as taking for the 05/24/21 encounter (Appointment) with Elouise Munroe, MD.  ?  ? ?Allergies:   Lisinopril  ? ?Social History  ? ?Tobacco Use  ? Smoking status: Former  ?  Packs/day: 1.00  ?  Years: 45.00  ?  Pack years: 45.00  ?  Types: Cigarettes  ?  Quit date: 07/27/2005  ?  Years since quitting: 15.7  ? Smokeless tobacco: Former  ?  Quit date: 03/2005  ?Vaping Use  ? Vaping Use: Never used  ?Substance Use Topics  ? Alcohol use: Not Currently  ?  Comment: beer occasionally   ? Drug use: No  ?  ? ?Family History: ?The patient's family history includes Cancer in her sister; Heart disease in her father; Tongue cancer in her sister. There is no history of Colon polyps, Rectal cancer, or Stomach cancer. ? ?ROS:   ?Please see the history of present illness.    ?All other systems reviewed and are negative. ? ?EKGs/Labs/Other Studies Reviewed:   ? ?The following studies were reviewed today: ? ?EKG:  11/04/20 - NSR, inferior and anterior infarct pattern. ?05/04/20 - NSR, inferior and anterior infarct pattern.  ? ?Recent Labs: ?02/08/2021: ALT 16; Hemoglobin 14.5; Platelets 286; TSH  1.670 ?04/12/2021: BUN 12; Creatinine, Ser 0.99; Magnesium 1.9; Potassium 3.6; Sodium 140  ?Recent Lipid Panel ?   ?Component Value Date/Time  ? CHOL 129 05/11/2020 0850  ? TRIG 133 05/11/2020 0850  ? HDL 40 05/11/2020 0850  ? CHOLHDL 3.2 05/11/2020 0850  ? CHOLHDL 4.4 08/29/2016 0920  ? VLDL 28 08/29/2016 0920  ? [place] 65 05/11/2020 0850  ? LDLDIRECT 81 03/21/2013 1431  ? ? ?Physical Exam:   ? ?VS:  There were no vitals taken for this visit.   ? ?Wt Readings from Last 5 Encounters:  ?05/06/21 162 lb (73.5 kg)  ?04/06/21 162 lb (73.5 kg)  ?03/31/21 163 lb (73.9 kg)  ?03/11/21 165 lb (74.8 kg)  ?02/24/21 162 lb 9.6 oz (73.8 kg)  ?  ?Constitutional: No acute distress ?Eyes: sclera non-icteric, normal conjunctiva and lids ?ENMT: normal dentition, moist mucous membranes ?Cardiovascular: regular rhythm, normal rate, no murmurs. S1 and S2 normal. Radial pulses normal bilaterally. No jugular venous distention.  ?  Respiratory: clear to auscultation bilaterally ?GI : normal bowel sounds, soft and nontender. No distention.   ?MSK: extremities warm, well perfused. No edema***.  ?NEURO: grossly nonfocal exam, moves all extremities. ?PSYCH: alert and oriented x 3, normal mood and affect.  ? ?ASSESSMENT:   ? ?1. Pulmonary HTN ([place])   ?2. Pulmonary emphysema, unspecified emphysema type ([place])   ?3. Dyspnea, unspecified type   ?4. Former smoker   ?5. Coronary artery disease involving native heart without angina pectoris, unspecified vessel or lesion type   ?6. S/P CABG x 3   ?7. Essential hypertension   ?8. Hyperlipidemia associated with type 2 diabetes mellitus ([place])   ?9. Type 2 diabetes mellitus with hyperglycemia, with long-term current use of insulin ([place])   ? ? ? ?PLAN:   ? ?PHTN ?Emphysema ?Dyspnea ?-likely driven by emphysema and group 3 PHTN. Diuresing with lasix 40 mg daily***. Continue to follow with pulmonary for chronic hypoxemic respiratory failure.  ? ?Coronary artery disease involving native heart without angina  pectoris, unspecified vessel or lesion type ?S/P CABG x 3 ?- continue ASA 81 mg daily ?- continue metoprolol tartrate 37.5 mg BID ?- continue rosuvastatin 20 mg daily.  ? ?Essential hypertension - Plan: EKG 12-Lead

## 2021-05-12 NOTE — Telephone Encounter (Signed)
PCC's can you please advise on status of o2 order ?

## 2021-05-13 NOTE — Telephone Encounter (Signed)
Estill Bamberg, please update the status of this order. Thanks. ?

## 2021-05-13 NOTE — Telephone Encounter (Signed)
Called and spoke to [place] at [facility]. She said they sent the order out to [facility]. She was going to contact them and see why the pt hasn't received what she needs. I told her that [facility] is wanting to pick up there supplies. I will contact them as well.  ?

## 2021-05-17 DIAGNOSIS — J439 Emphysema, unspecified: Secondary | ICD-10-CM | POA: Diagnosis not present

## 2021-05-19 ENCOUNTER — Ambulatory Visit
Admission: RE | Admit: 2021-05-19 | Discharge: 2021-05-19 | Disposition: A | Discharge status: Home / Self Care | Payer: No Typology Code available for payment source | Source: Ambulatory Visit | Attending: Pulmonary Disease | Admitting: Pulmonary Disease

## 2021-05-19 ENCOUNTER — Other Ambulatory Visit: Payer: No Typology Code available for payment source

## 2021-05-19 DIAGNOSIS — J849 Interstitial pulmonary disease, unspecified: Secondary | ICD-10-CM

## 2021-05-19 DIAGNOSIS — J479 Bronchiectasis, uncomplicated: Secondary | ICD-10-CM | POA: Diagnosis not present

## 2021-05-19 DIAGNOSIS — J439 Emphysema, unspecified: Secondary | ICD-10-CM | POA: Diagnosis not present

## 2021-05-19 DIAGNOSIS — I700 Atherosclerosis of aorta: Secondary | ICD-10-CM | POA: Diagnosis not present

## 2021-05-19 DIAGNOSIS — I2510 Atherosclerotic heart disease of native coronary artery without angina pectoris: Secondary | ICD-10-CM | POA: Diagnosis not present

## 2021-05-24 ENCOUNTER — Encounter: Payer: Self-pay | Admitting: Internal Medicine

## 2021-05-24 ENCOUNTER — Other Ambulatory Visit: Payer: Self-pay

## 2021-05-24 ENCOUNTER — Ambulatory Visit (INDEPENDENT_AMBULATORY_CARE_PROVIDER_SITE_OTHER): Payer: No Typology Code available for payment source | Admitting: Internal Medicine

## 2021-05-24 VITALS — BP 128/70 | HR 84 | Ht 63.0 in | Wt 162.6 lb

## 2021-05-24 DIAGNOSIS — Z794 Long term (current) use of insulin: Secondary | ICD-10-CM

## 2021-05-24 DIAGNOSIS — I272 Pulmonary hypertension, unspecified: Secondary | ICD-10-CM

## 2021-05-24 DIAGNOSIS — E8779 Other fluid overload: Secondary | ICD-10-CM | POA: Diagnosis not present

## 2021-05-24 DIAGNOSIS — I2510 Atherosclerotic heart disease of native coronary artery without angina pectoris: Secondary | ICD-10-CM | POA: Diagnosis not present

## 2021-05-24 DIAGNOSIS — E785 Hyperlipidemia, unspecified: Secondary | ICD-10-CM | POA: Diagnosis not present

## 2021-05-24 DIAGNOSIS — I10 Essential (primary) hypertension: Secondary | ICD-10-CM

## 2021-05-24 DIAGNOSIS — R0600 Dyspnea, unspecified: Secondary | ICD-10-CM | POA: Diagnosis not present

## 2021-05-24 DIAGNOSIS — Z79899 Other long term (current) drug therapy: Secondary | ICD-10-CM | POA: Diagnosis not present

## 2021-05-24 DIAGNOSIS — Z951 Presence of aortocoronary bypass graft: Secondary | ICD-10-CM | POA: Diagnosis not present

## 2021-05-24 DIAGNOSIS — E1169 Type 2 diabetes mellitus with other specified complication: Secondary | ICD-10-CM

## 2021-05-24 DIAGNOSIS — J439 Emphysema, unspecified: Secondary | ICD-10-CM

## 2021-05-24 DIAGNOSIS — Z87891 Personal history of nicotine dependence: Secondary | ICD-10-CM

## 2021-05-24 DIAGNOSIS — E1165 Type 2 diabetes mellitus with hyperglycemia: Secondary | ICD-10-CM | POA: Diagnosis not present

## 2021-05-24 MED ORDER — FUROSEMIDE 40 MG PO TABS: 40.0000 mg | ORAL_TABLET | Freq: Every day | ORAL | 2 refills | Status: DC

## 2021-05-24 NOTE — Patient Instructions (Signed)
Medication Instructions:  ?LASIX REFILLED  ?*If you need a refill on your cardiac medications before your next appointment, please call your pharmacy* ? ?Lab Work: ?BMET- TODAY  ?If you have labs (blood work) drawn today and your tests are completely normal, you will receive your results only by: ?MyChart Message (if you have MyChart) OR ?A paper copy in the mail ?If you have any lab test that is abnormal or we need to change your treatment, we will call you to review the results. ? ?Follow-Up: ?At [facility], you and your health needs are our priority.  As part of our continuing mission to provide you with exceptional heart care, we have created designated Provider Care Teams.  These Care Teams include your primary Cardiologist (physician) and Advanced Practice Providers (APPs -  Physician Assistants and Nurse Practitioners) who all work together to provide you with the care you need, when you need it. ? ?We recommend signing up for the patient portal called "MyChart".  Sign up information is provided on this After Visit Summary.  MyChart is used to connect with patients for Virtual Visits (Telemedicine).  Patients are able to view lab/test results, encounter notes, upcoming appointments, etc.  Non-urgent messages can be sent to your provider as well.   ?To learn more about what you can do with MyChart, go to [email or web address].   ? ?Your next appointment:   ?3 month(s) ? ?The format for your next appointment:   ?In Person ? ?Provider:   ?Elouise Munroe, MD   ? ?

## 2021-05-24 NOTE — Progress Notes (Signed)
?Cardiology Office Note:   ? ?Date:  05/24/2021  ? ?ID:  Jessica Hart, DOB [birth year], MRN [id number] ? ?PCP:  Carlena Hurl, PA-C  ?Cardiologist:  Elouise Munroe, MD  ?Electrophysiologist:  None  ? ?Referring MD: Girtha Rm, NP-C  ? ?Chief Complaint/Reason for Referral: ?CAD s/p CABG, new dyspnea/hypoxic respiratory failure ? ?History of Present Illness:   ? ?Jessica Hart is a 81 y.o. female with a history of diabetes mellitus type 2, hypertension, hyperlipidemia, GERD, remote tobacco abuse who presents for follow-up after CABG x3 LIMA to LAD, reverse saphenous vein graft to PDA and OM1.  She was initially referred for coronary angiography given progressive chest pain and infarct seen on stress test inferior and inferolateral with nuclear stress EF of 36%. Coronary angiography showed severe three-vessel coronary artery obstructive disease with severe three-vessel calcification. Coronary artery bypass grafting performed on 09/02/2019. ? ?Recently had an echo and seen by Pulmonary for dyspnea. PHTN noted on echo with elevated RVSP and RV dysfunction and enlargement, and evalulation with pulmonary suggets chronic hypoxemic resp failure likely secondary to prior smoking and emphysema with predominantly group 3 PHTN. Also felt to have volume overload and was diuresed by Dr. Silas Flood, possible Group 2 component. Noted to have grade 1 diastolic dysfunction on recent echo.  ? ?She is feeling well and volume status appears improved. Currently on lasix 40 mg daily and urinates frequently. She also reports her bowel movements have been infrequent after decreasing metformin. In the past few days, she notices swelling in her bilateral toes. She is unable to clearly say whether diuresis helped with her symptoms.  ? ?Using oxygen 4L per pulmonary and continues to feel short of breath. Intended to have sleep study, this has yet to be completed. The sleep study is scheduled for next month. She reports she  has not been able to sleep for a full 8 hours in several years. She is unsure why she has trouble sleeping.  ? ?When she turns her head, she notices a bulge on the L-side of her neck. She denies neck pain. She reports, a few decades ago, she was recommended to undergo further imaging for her carotids. This was completed in 2021 as part of her pre CABG workup and showed mild bilateral carotid artery disease.  ? ?For diet, she limits adding salt to her food. She admits to not staying active recently. She denies chest pain, chest pressure, PND, or orthopnea.  ?  ? ?Past Medical History:  ?Diagnosis Date  ? Arthritis   ? Cataract bilaterl 3 years ago  ? Coronary artery disease   ? Diabetes mellitus 02/27/2005  ? Diabetes mellitus type 2, controlled, without complications ([place]) 9/37/9024  ? Qualifier: Diagnosis of  By: Drucie Ip    ? Diverticulosis   ? Dyspnea   ? Echocardiogram findings abnormal, without diagnosis 2005  ? EF 47%, no ischemia, no infarction  ? Former smoker 01/10/2017  ? 45 pack year history, quit in 2007   ? GERD (gastroesophageal reflux disease)   ? Heart murmur   ? History of bone density study 2006  ? Lt hip = -1.0, L spine = -1.8   ? Hyperlipidemia   ? Hypertension   ? ? ?Past Surgical History:  ?Procedure Laterality Date  ? ABDOMINAL HYSTERECTOMY  1989  ? one ovary remains per patient  ? CARDIAC CATHETERIZATION  08/20/2019  ? COLONOSCOPY    ? CORONARY ARTERY BYPASS GRAFT N/A 09/02/2019  ? Procedure: CORONARY  ARTERY BYPASS GRAFTING (CABG) TIMES THREE USING LEFT INTERNAL MAMMARY ARTERY AND RIGHT GREATER SAPHENOUS VEIN;  Surgeon: Lajuana Matte, MD;  Location: [facility];  Service: Open Heart Surgery;  Laterality: N/A;  ? ENDOVEIN HARVEST OF GREATER SAPHENOUS VEIN Right 09/02/2019  ? Procedure: ENDOVEIN HARVEST OF GREATER SAPHENOUS VEIN;  Surgeon: Lajuana Matte, MD;  Location: [place];  Service: Open Heart Surgery;  Laterality: Right;  ? EYE SURGERY Bilateral   ? CATARACT  ? LEFT HEART CATH AND  CORONARY ANGIOGRAPHY N/A 08/20/2019  ? Procedure: LEFT HEART CATH AND CORONARY ANGIOGRAPHY;  Surgeon: Belva Crome, MD;  Location: [facility];  Service: Cardiovascular;  Laterality: N/A;  ? TEE WITHOUT CARDIOVERSION N/A 09/02/2019  ? Procedure: TRANSESOPHAGEAL ECHOCARDIOGRAM (TEE);  Surgeon: Lajuana Matte, MD;  Location: [facility];  Service: Open Heart Surgery;  Laterality: N/A;  ? TONSILECTOMY, ADENOIDECTOMY, BILATERAL MYRINGOTOMY AND TUBES  1965  ? TONSILLECTOMY    ? ? ?Current Medications: ?Current Meds  ?Medication Sig  ? acetaminophen (TYLENOL) 500 MG tablet Take 2 tablets (1,000 mg total) by mouth every 8 (eight) hours as needed. (Patient taking differently: Take 1,000 mg by mouth 2 (two) times daily.)  ? amLODipine (NORVASC) 10 MG tablet Take 1 tablet (10 mg total) by mouth daily.  ? aspirin EC 81 MG tablet Take 1 tablet (81 mg total) by mouth daily. Swallow whole.  ? Cholecalciferol (D3 5000) 125 MCG (5000 UT) capsule Take 5,000 Units by mouth daily.  ? clotrimazole-betamethasone (LOTRISONE) cream Apply 1 application topically daily.  ? dapagliflozin propanediol (FARXIGA) 5 MG TABS tablet Take 1 tablet (5 mg total) by mouth daily.  ? Fluticasone-Umeclidin-Vilant (TRELEGY ELLIPTA) 100-62.5-25 MCG/ACT AEPB Inhale 1 puff into the lungs daily.  ? insulin degludec (TRESIBA FLEXTOUCH) 100 UNIT/ML FlexTouch Pen Inject 15 Units into the skin daily after supper.  ? Insulin Pen Needle (PEN NEEDLES 5/16") 30G X 8 MM MISC Pen needles used for lantus injections  ? Insulin Pen Needle (PEN NEEDLES) 32G X 4 MM MISC 1 each by Other route daily.  ? lipase/protease/amylase (CREON) 36000 UNITS CPEP capsule Take 2 capsules with each meal three times daily  Take 1 capsule with each snack twice daily  ? losartan (COZAAR) 50 MG tablet Take 1 tablet (50 mg total) by mouth daily.  ? metFORMIN (GLUCOPHAGE-XR) 750 MG 24 hr tablet TAKE 1 TABLET BY MOUTH EVERY DAY WITH BREAKFAST  ? metoprolol tartrate (LOPRESSOR) 25 MG tablet  Take 25 mg by mouth 2 (two) times daily. Take 1.5 tablets(37.5 mg total) by mouth 2 (two) times daily.  ? Misc. Devices MISC SIG: 1 L of Oxygen via Nasal cannula around the clock Dispense Home Oxygen and Portable tank and necessary supplies. Dx: Hypoxemia, Shortness of breath  ? omeprazole (PRILOSEC) 40 MG capsule TAKE 1 CAPSULE BY MOUTH EVERY DAY  ? OneTouch Delica Lancets 12Y MISC USE TO TEST BLOOD SUGAR ONCE A DAY  ? ONETOUCH VERIO test strip USE 1 STRIP TO CHECK GLUCOSE ONCE DAILY  ? rosuvastatin (CRESTOR) 20 MG tablet Take 1 tablet (20 mg total) by mouth daily.  ? vitamin B-12 (CYANOCOBALAMIN) 500 MCG tablet Take 500 mcg by mouth daily.  ? [DISCONTINUED] furosemide (LASIX) 40 MG tablet Take 1 tablet (40 mg total) by mouth daily.  ? [DISCONTINUED] metoprolol tartrate (LOPRESSOR) 25 MG tablet Take 1.5 tablets (37.5 mg total) by mouth 2 (two) times daily.  ?  ? ?Allergies:   Lisinopril  ? ?Social History  ? ?  Tobacco Use  ? Smoking status: Former  ?  Packs/day: 1.00  ?  Years: 45.00  ?  Pack years: 45.00  ?  Types: Cigarettes  ?  Quit date: 07/27/2005  ?  Years since quitting: 15.8  ? Smokeless tobacco: Former  ?  Quit date: 03/2005  ?Vaping Use  ? Vaping Use: Never used  ?Substance Use Topics  ? Alcohol use: Not Currently  ?  Comment: beer occasionally   ? Drug use: No  ?  ? ?Family History: ?The patient's family history includes Cancer in her sister; Heart disease in her father; Tongue cancer in her sister. There is no history of Colon polyps, Rectal cancer, or Stomach cancer. ? ?ROS:   ?Please see the history of present illness.    ?(+) Dyspnea ?(+) Infrequent bowel movements ?(+) Bilateral LE edema (toes) ?(+) Insomnia ?All other systems reviewed and are negative. ? ?EKGs/Labs/Other Studies Reviewed:   ? ?The following studies were reviewed today: ?Chest CT 05/19/21 ?1. 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 Outpatient referral to Pulmonology for further clinical evaluation is recommended. ?2. Mild dilatation of the pulmonic trunk (3.4 cm in diameter), ?concerning

## 2021-05-25 LAB — BASIC METABOLIC PANEL
BUN/Creatinine Ratio: 11 — ABNORMAL LOW (ref 12–28)
BUN: 10 mg/dL (ref 8–27)
CO2: 24 mmol/L (ref 20–29)
Calcium: 9.8 mg/dL (ref 8.7–10.3)
Chloride: 103 mmol/L (ref 96–106)
Creatinine, Ser: 0.89 mg/dL (ref 0.57–1.00)
Glucose: 157 mg/dL — ABNORMAL HIGH (ref 70–99)
Potassium: 3.9 mmol/L (ref 3.5–5.2)
Sodium: 144 mmol/L (ref 134–144)
eGFR: 66 mL/min/{1.73_m2} (ref >59)

## 2021-05-27 ENCOUNTER — Ambulatory Visit: Payer: Medicare HMO | Admitting: Pulmonary Disease

## 2021-05-27 ENCOUNTER — Other Ambulatory Visit: Payer: Self-pay | Admitting: Pulmonary Disease

## 2021-05-27 DIAGNOSIS — J439 Emphysema, unspecified: Secondary | ICD-10-CM

## 2021-05-30 ENCOUNTER — Encounter: Payer: Self-pay | Admitting: Pulmonary Disease

## 2021-05-30 ENCOUNTER — Ambulatory Visit (INDEPENDENT_AMBULATORY_CARE_PROVIDER_SITE_OTHER): Payer: No Typology Code available for payment source | Admitting: Pulmonary Disease

## 2021-05-30 VITALS — BP 128/74 | HR 89 | Temp 98.8°F | Ht 63.0 in | Wt 160.8 lb

## 2021-05-30 DIAGNOSIS — I272 Pulmonary hypertension, unspecified: Secondary | ICD-10-CM | POA: Diagnosis not present

## 2021-05-30 NOTE — Patient Instructions (Addendum)
Neck to see you again ? ?I think it is okay to experiment with checking your oxygen ? ?Please get a pulse oximeter.  Please make sure the oxygen saturation is 92% or higher.  I think the higher target because of the pulmonary hypertension to help decrease stress on the heart. ? ?Check your oxygen saturation at rest and when you move around your house etc.  If the numbers stays above 92%, you can you can turn down the flow by 1 L.  Keep doing this as you are able.  You may find that at some point the oxygen numbers will drop and you need to stay on oxygen.  What ever this number is please let me know.  When you come to clinic next time we will do a full 6-minute walk. We will see how much oxygen you need.  ? ?Continue taking Trelegy, I have provided some samples today ? ?Your recent CT scan showed emphysema and some early scarring, this is why you need oxygen ? ?We need to get the sleep study done and resulted.  I would like to see the results before determining next steps, you may need to be treated for sleep apnea for some months before repeating the heart ultrasound or considering a heart catheterization.  I will discuss with your heart doctor. ? ?Return to clinic in 3 months or sooner as needed with Dr. Silas Flood ?

## 2021-05-31 ENCOUNTER — Telehealth: Payer: Self-pay | Admitting: Pulmonary Disease

## 2021-05-31 NOTE — Telephone Encounter (Signed)
Spoke with patient about this in office yesterday. Nothing further  ?

## 2021-06-01 ENCOUNTER — Telehealth: Payer: Self-pay | Admitting: Pulmonary Disease

## 2021-06-01 NOTE — Telephone Encounter (Signed)
Attempted to call pt but unable to reach. Left message for her to return call. ?

## 2021-06-05 ENCOUNTER — Other Ambulatory Visit: Payer: Self-pay | Admitting: Gastroenterology

## 2021-06-06 ENCOUNTER — Other Ambulatory Visit: Payer: Self-pay

## 2021-06-08 ENCOUNTER — Telehealth: Payer: Self-pay

## 2021-06-08 NOTE — Telephone Encounter (Signed)
Pt called for samples of tresbia. Pt was advise to come by and pick it up. [place] ?

## 2021-06-12 ENCOUNTER — Ambulatory Visit (HOSPITAL_BASED_OUTPATIENT_CLINIC_OR_DEPARTMENT_OTHER): Payer: No Typology Code available for payment source | Attending: Pulmonary Disease | Admitting: Pulmonary Disease

## 2021-06-12 DIAGNOSIS — R0902 Hypoxemia: Secondary | ICD-10-CM | POA: Diagnosis not present

## 2021-06-12 DIAGNOSIS — I270 Primary pulmonary hypertension: Secondary | ICD-10-CM | POA: Diagnosis present

## 2021-06-12 DIAGNOSIS — R0683 Snoring: Secondary | ICD-10-CM | POA: Insufficient documentation

## 2021-06-16 DIAGNOSIS — R0902 Hypoxemia: Secondary | ICD-10-CM | POA: Diagnosis not present

## 2021-06-16 DIAGNOSIS — I270 Primary pulmonary hypertension: Secondary | ICD-10-CM

## 2021-06-16 NOTE — Progress Notes (Signed)
No sleep apnea. She needs to use 4L at night when sleeping.

## 2021-06-16 NOTE — Addendum Note (Signed)
Addended by: Monna Fam L on: 06/16/2021 12:30 PM ? ? Modules accepted: Orders ? ?

## 2021-06-16 NOTE — Procedures (Signed)
Patient Name: Jessica Hart, Jessica Hart ?Study Date: 06/12/2021 ?Gender: Female ?D.O.B: 1940/04/15 ?Age (years): 4 ?Referring Provider: Larey Days MD ?Height (inches): 63 ?Interpreting Physician: Kara Mead MD, ABSM ?Weight (lbs): 163 ?RPSGT: Baxter Flattery ?BMI: 29 ?MRN: [id number] ?Neck Size: 14.00 ?<br> <br> ?CLINICAL INFORMATION ?Sleep Study Type: NPSG ? ? ? ?Indication for sleep study: Fatigue, Snoring, Witnesses Apnea / Gasping During Sleep ,pulmonary hypertension, chronic hypoxic respiratory failure ? ? ? ?Epworth Sleepiness Score: ? ? ? ?SLEEP STUDY TECHNIQUE ?As per the AASM Manual for the Scoring of Sleep and Associated Events v2.3 (April 2016) with a hypopnea requiring 4% desaturations. ? ?The channels recorded and monitored were frontal, central and occipital EEG, electrooculogram (EOG), submentalis EMG (chin), nasal and oral airflow, thoracic and abdominal wall motion, anterior tibialis EMG, snore microphone, electrocardiogram, and pulse oximetry. ? ?MEDICATIONS ?Medications self-administered by patient taken the night of the study : N/A ? ?SLEEP ARCHITECTURE ?The study was initiated at 10:49:56 PM and ended at 4:59:26 AM. ? ?Sleep onset time was 4.0 minutes and the sleep efficiency was 79.4%%. The total sleep time was 293.5 minutes. ? ?Stage REM latency was 137.5 minutes. ? ?The patient spent 1.2%% of the night in stage N1 sleep, 79.7%% in stage N2 sleep, 8.3%% in stage N3 and 10.7% in REM. ? ?Alpha intrusion was absent. ? ?Supine sleep was 25.56%. ? ?RESPIRATORY PARAMETERS ?The overall apnea/hypopnea index (AHI) was 2.5 per hour. There were 12 total apneas, including 11 obstructive, 1 central and 0 mixed apneas. There were 0 hypopneas and 0 RERAs. ? ?The AHI during Stage REM sleep was 0.0 per hour. ? ?AHI while supine was 0.0 per hour. ? ?The mean oxygen saturation was 85.7%. The minimum SpO2 during sleep was 74.0%. Oxygen was gradually increased to 4 L by 2:10 am ? ?moderate snoring was noted during  this study. ? ?CARDIAC DATA ?The 2 lead EKG demonstrated sinus rhythm. The mean heart rate was 74.6 beats per minute. Other EKG findings include: None. ? ? ?LEG MOVEMENT DATA ?The total PLMS were 0 with a resulting PLMS index of 0.0. Associated arousal with leg movement index was 0.0 . ? ?IMPRESSIONS ?- No significant obstructive sleep apnea occurred during this study (AHI = 2.5/h). ?- No significant central sleep apnea occurred during this study (CAI = 0.2/h). ?- Moderate oxygen desaturation was noted during this study (Min O2 = 74.0%). This was corrected by titrating oxygen to 4 L ?- The patient snored with moderate snoring volume. ?- No cardiac abnormalities were noted during this study. ?- Clinically significant periodic limb movements did not occur during sleep. No significant associated arousals. ? ? ?DIAGNOSIS ?- Nocturnal Hypoxemia (G47.36) ? ? ?RECOMMENDATIONS ?- Evaluate cause for nocturnal hypoxia. 4 L O2 required during sleep ?- Avoid alcohol, sedatives and other CNS depressants that may worsen sleep apnea and disrupt normal sleep architecture. ?- Sleep hygiene should be reviewed to assess factors that may improve sleep quality. ?- Weight management and regular exercise should be initiated or continued if appropriate. ? ? ?Kara Mead MD ?Board Certified in Sleep medicine ? ?

## 2021-07-07 ENCOUNTER — Other Ambulatory Visit: Payer: Self-pay | Admitting: Medical

## 2021-07-07 DIAGNOSIS — Z1231 Encounter for screening mammogram for malignant neoplasm of breast: Secondary | ICD-10-CM

## 2021-07-11 ENCOUNTER — Ambulatory Visit (INDEPENDENT_AMBULATORY_CARE_PROVIDER_SITE_OTHER): Payer: No Typology Code available for payment source | Admitting: Medical

## 2021-07-11 VITALS — BP 130/80 | HR 101 | Temp 97.9°F | Wt 163.4 lb

## 2021-07-11 DIAGNOSIS — R609 Edema, unspecified: Secondary | ICD-10-CM | POA: Diagnosis not present

## 2021-07-11 DIAGNOSIS — R0902 Hypoxemia: Secondary | ICD-10-CM

## 2021-07-11 DIAGNOSIS — E781 Pure hyperglyceridemia: Secondary | ICD-10-CM | POA: Diagnosis not present

## 2021-07-11 DIAGNOSIS — E1169 Type 2 diabetes mellitus with other specified complication: Secondary | ICD-10-CM

## 2021-07-11 DIAGNOSIS — Z951 Presence of aortocoronary bypass graft: Secondary | ICD-10-CM | POA: Diagnosis not present

## 2021-07-11 DIAGNOSIS — I2510 Atherosclerotic heart disease of native coronary artery without angina pectoris: Secondary | ICD-10-CM

## 2021-07-11 DIAGNOSIS — I7090 Unspecified atherosclerosis: Secondary | ICD-10-CM | POA: Diagnosis not present

## 2021-07-11 DIAGNOSIS — J9611 Chronic respiratory failure with hypoxia: Secondary | ICD-10-CM | POA: Diagnosis not present

## 2021-07-11 DIAGNOSIS — I10 Essential (primary) hypertension: Secondary | ICD-10-CM | POA: Diagnosis not present

## 2021-07-11 DIAGNOSIS — I5032 Chronic diastolic (congestive) heart failure: Secondary | ICD-10-CM | POA: Diagnosis not present

## 2021-07-11 NOTE — Progress Notes (Signed)
Subjective: ?Chief Complaint  ?Patient presents with  ? leg and feet swelling  ?  Leg and feet swelling- on and off for couple weeks. O2 was 64 when taken to weight scale. On 4 liters of o2 and o2 is staying around 84. Some SOB, some chest discomfort every once in a while. No other pain  ? ?Here for leg and feet swelling.  Having some swelling in legs and feet x 3 weeks.  Sometimes calves feel tight.  She does endorse drinking ginger ale somewhat regularly, has been eating some low-sodium bacon and neeses sausage.  She has felt some dry mouth of late. ? ?CMA noted that she did not have her oxygen when she came in today.  She seems short of breath.  She denies any recent chest pain or palpitations. ? ?Lung disease - seeing pulmonology.  Uses oxygen all night, and uses most of the time, but takes a break every now and then.   After using her Trelegy, feels opened up and holds off the oxygen for a little while.  Uses 3.5-4 l/min oxygen.   47m sometimes is too much, causing too much mucous in nose.   ? ?Diabetes-Hasnt checked blood sugars of late.  Doesn't eat a lot for breakfast.   Has usually had good morning readings in the past.   ? ?Hypertension-compliant with medication, not checking BP at home.  ? ?She notes prior sleep study that did not show any significant sleep apnea ? ? ?ROS as in subjective ? ?Past Medical History:  ?Diagnosis Date  ? Arthritis   ? Cataract bilaterl 3 years ago  ? Coronary artery disease   ? Diabetes mellitus 02/27/2005  ? Diabetes mellitus type 2, controlled, without complications ([place] 20/93/8182 ? Qualifier: Diagnosis of  By: KDrucie Ip   ? Diverticulosis   ? Dyspnea   ? Echocardiogram findings abnormal, without diagnosis 2005  ? EF 47%, no ischemia, no infarction  ? Former smoker 01/10/2017  ? 45 pack year history, quit in 2007   ? GERD (gastroesophageal reflux disease)   ? Heart murmur   ? History of bone density study 2006  ? Lt hip = -1.0, L spine = -1.8   ? Hyperlipidemia   ?  Hypertension   ? ?Current Outpatient Medications on File Prior to Visit  ?Medication Sig Dispense Refill  ? acetaminophen (TYLENOL) 500 MG tablet Take 2 tablets (1,000 mg total) by mouth every 8 (eight) hours as needed. (Patient taking differently: Take 1,000 mg by mouth 2 (two) times daily.) 30 tablet 2  ? amLODipine (NORVASC) 10 MG tablet Take 1 tablet (10 mg total) by mouth daily. 90 tablet 1  ? aspirin EC 81 MG tablet Take 1 tablet (81 mg total) by mouth daily. Swallow whole. 90 tablet 3  ? Cholecalciferol (D3 5000) 125 MCG (5000 UT) capsule Take 5,000 Units by mouth daily.    ? clotrimazole-betamethasone (LOTRISONE) cream Apply 1 application topically daily. 30 g 0  ? CREON 36000-114000 units CPEP capsule TAKE 2 CAPSULES BY MOUTH 3 TIMES A DAY WITH MEALS AND TAKE 1 CAPSULE WITH EACH SNACK TWICE DAILY 240 capsule 11  ? dapagliflozin propanediol (FARXIGA) 5 MG TABS tablet Take 1 tablet (5 mg total) by mouth daily. 90 tablet 1  ? Fluticasone-Umeclidin-Vilant (TRELEGY ELLIPTA) 100-62.5-25 MCG/ACT AEPB Inhale 1 puff into the lungs daily. 1 each 2  ? furosemide (LASIX) 40 MG tablet Take 1 tablet (40 mg total) by mouth daily. 30 tablet 2  ?  insulin degludec (TRESIBA FLEXTOUCH) 100 UNIT/ML FlexTouch Pen Inject 15 Units into the skin daily after supper. 3 mL 2  ? Insulin Pen Needle (PEN NEEDLES 5/16") 30G X 8 MM MISC Pen needles used for lantus injections 100 each 3  ? Insulin Pen Needle (PEN NEEDLES) 32G X 4 MM MISC 1 each by Other route daily. 100 each 1  ? losartan (COZAAR) 50 MG tablet Take 1 tablet (50 mg total) by mouth daily. 90 tablet 1  ? metFORMIN (GLUCOPHAGE-XR) 750 MG 24 hr tablet TAKE 1 TABLET BY MOUTH EVERY DAY WITH BREAKFAST 90 tablet 1  ? metoprolol tartrate (LOPRESSOR) 25 MG tablet Take 25 mg by mouth 2 (two) times daily. Take 1.5 tablets(37.5 mg total) by mouth 2 (two) times daily.    ? Misc. Devices MISC SIG: 1 L of Oxygen via Nasal cannula around the clock Dispense Home Oxygen and Portable tank and  necessary supplies. Dx: Hypoxemia, Shortness of breath (Patient taking differently: SIG: 3.5 L of Oxygen via Nasal cannula around the clock Dispense Home Oxygen and Portable tank and necessary supplies. Dx: Hypoxemia, Shortness of breath) 1 Device 0  ? omeprazole (PRILOSEC) 40 MG capsule TAKE 1 CAPSULE BY MOUTH EVERY DAY 90 capsule 1  ? OneTouch Delica Lancets 67M MISC USE TO TEST BLOOD SUGAR ONCE A DAY 100 each 1  ? ONETOUCH VERIO test strip USE 1 STRIP TO CHECK GLUCOSE ONCE DAILY 100 strip 1  ? rosuvastatin (CRESTOR) 20 MG tablet Take 1 tablet (20 mg total) by mouth daily. 90 tablet 3  ? vitamin B-12 (CYANOCOBALAMIN) 500 MCG tablet Take 500 mcg by mouth daily.    ? Metoprolol Tartrate 37.5 MG TABS Take 1 tablet by mouth 2 (two) times daily. (Patient not taking: Reported on 07/11/2021)    ? ?No current facility-administered medications on file prior to visit.  ? ? ? ?Objective: ?BP 130/80   Pulse (!) 101   Temp 97.9 ?F (36.6 ?C)   Wt 163 lb 6.4 oz (74.1 kg)   BMI 28.95 kg/m?  ? ?Wt Readings from Last 3 Encounters:  ?07/11/21 163 lb 6.4 oz (74.1 kg)  ?06/12/21 163 lb (73.9 kg)  ?05/30/21 160 lb 12.8 oz (72.9 kg)  ? ?Gen: wd, wn ,nad ?Lungs: Decreased sounds in general but no dullness or wheezing, seated on 4 L/min of oxygen currently ?Heart: Regular rate rhythm, normal S1-S2, no murmurs ?Mid to lower legs and feet with 1+ pitting edema bilaterally, no asymmetry, no discoloration ?Legs warm and dry ?2+ lower extremity pulses, normal cap refill ? ? ? ? ?Assessment: ?Encounter Diagnoses  ?Name Primary?  ? Edema, unspecified type Yes  ? 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 S/P CABG x 3   ? Atherosclerosis   ? Coronary artery disease involving native coronary artery of native heart without angina pectoris   ? Primary hypertension   ? Hypoxemia   ? ? ? ?Plan: ?Edema - we discussed possible differential.  She is consuming probably more salt than she needs to be.  Additional labs as below.  We  discussed using compression hose.  Possible referral for fitted prescription hose.  She is on Lasix once daily.  I recommend she go ahead and take a second 1 this afternoon.   Use leg elevation.  We will call with other recommendations once I get labs back.  Limit salt.  She had an echocardiogram in January showing some pulmonary hypertension.  I reviewed over her cardiology notes from  May 24, 2021.  At that time they felt like her emphysema was driving the pulmonary hypertension.  She was continued on Lasix 40 mg daily.  They were pursuing sleep study.  If sleep study did not show significant apnea they were going to consider other therapy.   Recent sleep study showed AHI 2.5.   f/u pending labs ? ?Diabetes-I recommended she check her sugars somewhat regularly.  She does not like sticking her fingers.  We discussed the potential for continuous glucose monitoring devices.  Updated labs today.  Continue medications including metformin XR 750 mg daily, Tresiba 15 units daily, Farxiga 5 mg daily ? ?Emphysema and hypoxemia-continue follow-up with pulmonology, continue oxygen therapy around-the-clock, continue current inhaler therapy ? ?Hypertension-continue amlodipine 10 mg daily, losartan 50 mg daily, metoprolol 37.5 mg twice daily, Lasix 40 mg daily in general ? ?Hyperlipidemia, coronary artery disease-continue Crestor 10 mg daily.  She was advised to use a PCSK9 previously and she declined ? ? ? ?Jessica Hart was seen today for leg and feet swelling. ? ?Diagnoses and all orders for this visit: ? ?Edema, unspecified type ?-     Brain natriuretic peptide ?-     Basic metabolic panel ?-     Hemoglobin A1c ? 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 ?S/P CABG x 3 ? ?Atherosclerosis ? ?Coronary artery disease involving native coronary artery of native heart without angina pectoris ?-     Brain natriuretic peptide ?-     Basic metabolic panel ? ?Primary hypertension ?-     Brain  natriuretic peptide ?-     Basic metabolic panel ? ?Hypoxemia ? ? ? ?F/u pending labs ? ?

## 2021-07-12 ENCOUNTER — Inpatient Hospital Stay (HOSPITAL_COMMUNITY)
Admission: EM | Admit: 2021-07-12 | Discharge: 2021-07-17 | DRG: 286 | Disposition: A | Discharge status: Home / Self Care | Payer: No Typology Code available for payment source | Attending: Internal Medicine | Admitting: Internal Medicine

## 2021-07-12 ENCOUNTER — Emergency Department (HOSPITAL_COMMUNITY): Payer: No Typology Code available for payment source

## 2021-07-12 ENCOUNTER — Other Ambulatory Visit: Payer: Self-pay | Admitting: Medical

## 2021-07-12 ENCOUNTER — Encounter (HOSPITAL_COMMUNITY): Payer: Self-pay | Admitting: Emergency Medicine

## 2021-07-12 ENCOUNTER — Telehealth: Payer: Self-pay | Admitting: Internal Medicine

## 2021-07-12 DIAGNOSIS — M542 Cervicalgia: Secondary | ICD-10-CM | POA: Diagnosis not present

## 2021-07-12 DIAGNOSIS — Z87891 Personal history of nicotine dependence: Secondary | ICD-10-CM

## 2021-07-12 DIAGNOSIS — E1169 Type 2 diabetes mellitus with other specified complication: Secondary | ICD-10-CM | POA: Diagnosis present

## 2021-07-12 DIAGNOSIS — Z79899 Other long term (current) drug therapy: Secondary | ICD-10-CM

## 2021-07-12 DIAGNOSIS — Z8249 Family history of ischemic heart disease and other diseases of the circulatory system: Secondary | ICD-10-CM

## 2021-07-12 DIAGNOSIS — I2721 Secondary pulmonary arterial hypertension: Secondary | ICD-10-CM | POA: Diagnosis present

## 2021-07-12 DIAGNOSIS — Z9981 Dependence on supplemental oxygen: Secondary | ICD-10-CM

## 2021-07-12 DIAGNOSIS — R69 Illness, unspecified: Secondary | ICD-10-CM

## 2021-07-12 DIAGNOSIS — Z951 Presence of aortocoronary bypass graft: Secondary | ICD-10-CM | POA: Diagnosis not present

## 2021-07-12 DIAGNOSIS — Z7984 Long term (current) use of oral hypoglycemic drugs: Secondary | ICD-10-CM

## 2021-07-12 DIAGNOSIS — E876 Hypokalemia: Secondary | ICD-10-CM | POA: Diagnosis present

## 2021-07-12 DIAGNOSIS — J439 Emphysema, unspecified: Secondary | ICD-10-CM | POA: Diagnosis present

## 2021-07-12 DIAGNOSIS — K219 Gastro-esophageal reflux disease without esophagitis: Secondary | ICD-10-CM | POA: Diagnosis present

## 2021-07-12 DIAGNOSIS — K5790 Diverticulosis of intestine, part unspecified, without perforation or abscess without bleeding: Secondary | ICD-10-CM | POA: Diagnosis present

## 2021-07-12 DIAGNOSIS — E785 Hyperlipidemia, unspecified: Secondary | ICD-10-CM | POA: Diagnosis not present

## 2021-07-12 DIAGNOSIS — R600 Localized edema: Secondary | ICD-10-CM | POA: Diagnosis not present

## 2021-07-12 DIAGNOSIS — N179 Acute kidney failure, unspecified: Secondary | ICD-10-CM | POA: Diagnosis present

## 2021-07-12 DIAGNOSIS — Z90710 Acquired absence of both cervix and uterus: Secondary | ICD-10-CM

## 2021-07-12 DIAGNOSIS — Z794 Long term (current) use of insulin: Secondary | ICD-10-CM

## 2021-07-12 DIAGNOSIS — Z7982 Long term (current) use of aspirin: Secondary | ICD-10-CM

## 2021-07-12 DIAGNOSIS — J9611 Chronic respiratory failure with hypoxia: Secondary | ICD-10-CM

## 2021-07-12 DIAGNOSIS — J9601 Acute respiratory failure with hypoxia: Principal | ICD-10-CM

## 2021-07-12 DIAGNOSIS — E781 Pure hyperglyceridemia: Secondary | ICD-10-CM | POA: Diagnosis present

## 2021-07-12 DIAGNOSIS — I2510 Atherosclerotic heart disease of native coronary artery without angina pectoris: Secondary | ICD-10-CM | POA: Diagnosis present

## 2021-07-12 DIAGNOSIS — M1990 Unspecified osteoarthritis, unspecified site: Secondary | ICD-10-CM | POA: Diagnosis present

## 2021-07-12 DIAGNOSIS — I5033 Acute on chronic diastolic (congestive) heart failure: Secondary | ICD-10-CM | POA: Diagnosis present

## 2021-07-12 DIAGNOSIS — I2723 Pulmonary hypertension due to lung diseases and hypoxia: Secondary | ICD-10-CM | POA: Diagnosis present

## 2021-07-12 DIAGNOSIS — I50810 Right heart failure, unspecified: Secondary | ICD-10-CM | POA: Diagnosis not present

## 2021-07-12 DIAGNOSIS — I110 Hypertensive heart disease with heart failure: Secondary | ICD-10-CM | POA: Diagnosis not present

## 2021-07-12 DIAGNOSIS — R9431 Abnormal electrocardiogram [ECG] [EKG]: Secondary | ICD-10-CM | POA: Diagnosis not present

## 2021-07-12 DIAGNOSIS — Z888 Allergy status to other drugs, medicaments and biological substances status: Secondary | ICD-10-CM

## 2021-07-12 DIAGNOSIS — J9621 Acute and chronic respiratory failure with hypoxia: Secondary | ICD-10-CM | POA: Diagnosis present

## 2021-07-12 DIAGNOSIS — J9690 Respiratory failure, unspecified, unspecified whether with hypoxia or hypercapnia: Secondary | ICD-10-CM | POA: Diagnosis not present

## 2021-07-12 LAB — CBC WITH DIFFERENTIAL/PLATELET
Abs Immature Granulocytes: 0.03 10*3/uL (ref 0.00–0.07)
Basophils Absolute: 0.1 10*3/uL (ref 0.0–0.1)
Basophils Relative: 1 %
Eosinophils Absolute: 0.1 10*3/uL (ref 0.0–0.5)
Eosinophils Relative: 1 %
HCT: 42.5 % (ref 36.0–46.0)
Hemoglobin: 13.8 g/dL (ref 12.0–15.0)
Immature Granulocytes: 0 %
Lymphocytes Relative: 15 %
Lymphs Abs: 1.4 10*3/uL (ref 0.7–4.0)
MCH: 27.8 pg (ref 26.0–34.0)
MCHC: 32.5 g/dL (ref 30.0–36.0)
MCV: 85.5 fL (ref 80.0–100.0)
Monocytes Absolute: 0.6 10*3/uL (ref 0.1–1.0)
Monocytes Relative: 7 %
Neutro Abs: 6.9 10*3/uL (ref 1.7–7.7)
Neutrophils Relative %: 76 %
Platelets: 237 10*3/uL (ref 150–400)
RBC: 4.97 MIL/uL (ref 3.87–5.11)
RDW: 16.7 % — ABNORMAL HIGH (ref 11.5–15.5)
WBC: 9.1 10*3/uL (ref 4.0–10.5)
nRBC: 0 % (ref 0.0–0.2)

## 2021-07-12 LAB — BASIC METABOLIC PANEL
Anion gap: 13 (ref 5–15)
BUN/Creatinine Ratio: 13 (ref 12–28)
BUN: 11 mg/dL (ref 8–27)
BUN: 11 mg/dL (ref 8–23)
CO2: 20 mmol/L (ref 20–29)
CO2: 21 mmol/L — ABNORMAL LOW (ref 22–32)
Calcium: 9.2 mg/dL (ref 8.7–10.3)
Calcium: 8.9 mg/dL (ref 8.9–10.3)
Chloride: 103 mmol/L (ref 96–106)
Chloride: 108 mmol/L (ref 98–111)
Creatinine, Ser: 0.85 mg/dL (ref 0.57–1.00)
Creatinine, Ser: 1.06 mg/dL — ABNORMAL HIGH (ref 0.44–1.00)
GFR, Estimated: 53 mL/min — ABNORMAL LOW (ref >60)
Glucose, Bld: 111 mg/dL — ABNORMAL HIGH (ref 70–99)
Glucose: 114 mg/dL — ABNORMAL HIGH (ref 70–99)
Potassium: 3.3 mmol/L — ABNORMAL LOW (ref 3.5–5.2)
Potassium: 2.6 mmol/L — CL (ref 3.5–5.1)
Sodium: 142 mmol/L (ref 134–144)
Sodium: 142 mmol/L (ref 135–145)
eGFR: 70 mL/min/{1.73_m2} (ref >59)

## 2021-07-12 LAB — TROPONIN I (HIGH SENSITIVITY)
Troponin I (High Sensitivity): 16 ng/L (ref <18)
Troponin I (High Sensitivity): 14 ng/L (ref <18)

## 2021-07-12 LAB — CBG MONITORING, ED: Glucose-Capillary: 82 mg/dL (ref 70–99)

## 2021-07-12 LAB — BRAIN NATRIURETIC PEPTIDE
B Natriuretic Peptide: 727.3 pg/mL — ABNORMAL HIGH (ref 0.0–100.0)
BNP: 658.5 pg/mL — ABNORMAL HIGH (ref 0.0–100.0)

## 2021-07-12 LAB — HEMOGLOBIN A1C
Est. average glucose Bld gHb Est-mCnc: 143 mg/dL
Hgb A1c MFr Bld: 6.6 % — ABNORMAL HIGH (ref 4.8–5.6)

## 2021-07-12 MED ORDER — METOPROLOL TARTRATE 37.5 MG PO TABS: 1.0000 | ORAL_TABLET | Freq: Two times a day (BID) | ORAL | Status: DC

## 2021-07-12 MED ORDER — PANTOPRAZOLE SODIUM 40 MG PO TBEC
40.0000 mg | DELAYED_RELEASE_TABLET | Freq: Every day | ORAL | Status: DC
Start: 2021-07-12 — End: 2021-07-17
  Administered 2021-07-12 – 2021-07-17 (×6): 40 mg via ORAL
  Filled 2021-07-12 (×6): qty 1

## 2021-07-12 MED ORDER — POTASSIUM CHLORIDE ER 10 MEQ PO TBCR: 10.0000 meq | EXTENDED_RELEASE_TABLET | Freq: Every day | ORAL | 0 refills | Status: DC

## 2021-07-12 MED ORDER — HEPARIN SODIUM (PORCINE) 5000 UNIT/ML IJ SOLN
5000.0000 [IU] | Freq: Three times a day (TID) | INTRAMUSCULAR | Status: DC
  Administered 2021-07-12 – 2021-07-15 (×8): 5000 [IU] via SUBCUTANEOUS
  Filled 2021-07-12 (×8): qty 1

## 2021-07-12 MED ORDER — POTASSIUM CHLORIDE CRYS ER 20 MEQ PO TBCR
40.0000 meq | EXTENDED_RELEASE_TABLET | Freq: Once | ORAL | Status: AC
  Administered 2021-07-12: 40 meq via ORAL
  Filled 2021-07-12: qty 2

## 2021-07-12 MED ORDER — ONDANSETRON HCL 4 MG/2ML IJ SOLN
4.0000 mg | Freq: Four times a day (QID) | INTRAMUSCULAR | Status: DC | PRN
Start: 2021-07-12 — End: 2021-07-17

## 2021-07-12 MED ORDER — METOPROLOL TARTRATE 25 MG PO TABS
37.5000 mg | ORAL_TABLET | Freq: Two times a day (BID) | ORAL | Status: DC
  Administered 2021-07-12 – 2021-07-17 (×10): 37.5 mg via ORAL
  Filled 2021-07-12 (×7): qty 1
  Filled 2021-07-12 (×2): qty 2
  Filled 2021-07-12 (×2): qty 1

## 2021-07-12 MED ORDER — ONDANSETRON HCL 4 MG PO TABS
4.0000 mg | ORAL_TABLET | Freq: Four times a day (QID) | ORAL | Status: DC | PRN
  Administered 2021-07-13: 4 mg via ORAL
  Filled 2021-07-12: qty 1

## 2021-07-12 MED ORDER — ACETAMINOPHEN 325 MG PO TABS: 650.0000 mg | ORAL_TABLET | Freq: Four times a day (QID) | ORAL | Status: DC | PRN

## 2021-07-12 MED ORDER — ACETAMINOPHEN 650 MG RE SUPP
650.0000 mg | Freq: Four times a day (QID) | RECTAL | Status: DC | PRN
Start: 2021-07-12 — End: 2021-07-17

## 2021-07-12 MED ORDER — INSULIN ASPART 100 UNIT/ML IJ SOLN: 0.0000 [IU] | Freq: Every day | INTRAMUSCULAR | Status: DC

## 2021-07-12 MED ORDER — FUROSEMIDE 10 MG/ML IJ SOLN
40.0000 mg | Freq: Once | INTRAMUSCULAR | Status: DC
Start: 2021-07-12 — End: 2021-07-12

## 2021-07-12 MED ORDER — POTASSIUM CHLORIDE CRYS ER 20 MEQ PO TBCR
40.0000 meq | EXTENDED_RELEASE_TABLET | Freq: Once | ORAL | Status: AC
Start: 2021-07-13 — End: 2021-07-13
  Administered 2021-07-13: 40 meq via ORAL
  Filled 2021-07-12 (×2): qty 2

## 2021-07-12 MED ORDER — ASPIRIN EC 81 MG PO TBEC: 81.0000 mg | DELAYED_RELEASE_TABLET | Freq: Every day | ORAL | Status: DC

## 2021-07-12 MED ORDER — INSULIN ASPART 100 UNIT/ML IJ SOLN
0.0000 [IU] | Freq: Three times a day (TID) | INTRAMUSCULAR | Status: DC
  Administered 2021-07-14: 3 [IU] via SUBCUTANEOUS
  Administered 2021-07-14 (×2): 2 [IU] via SUBCUTANEOUS
  Administered 2021-07-15 – 2021-07-17 (×3): 3 [IU] via SUBCUTANEOUS

## 2021-07-12 MED ORDER — DAPAGLIFLOZIN PROPANEDIOL 5 MG PO TABS
5.0000 mg | ORAL_TABLET | Freq: Every day | ORAL | Status: DC
  Administered 2021-07-13 – 2021-07-17 (×5): 5 mg via ORAL
  Filled 2021-07-12 (×5): qty 1

## 2021-07-12 MED ORDER — POTASSIUM CHLORIDE CRYS ER 20 MEQ PO TBCR
40.0000 meq | EXTENDED_RELEASE_TABLET | Freq: Once | ORAL | Status: AC
  Administered 2021-07-13: 40 meq via ORAL
  Filled 2021-07-12: qty 2

## 2021-07-12 MED ORDER — POTASSIUM CHLORIDE 10 MEQ/100ML IV SOLN
10.0000 meq | Freq: Once | INTRAVENOUS | Status: AC
  Administered 2021-07-12: 10 meq via INTRAVENOUS
  Filled 2021-07-12: qty 100

## 2021-07-12 MED ORDER — TRESIBA FLEXTOUCH 100 UNIT/ML ~~LOC~~ SOPN: 15.0000 [IU] | PEN_INJECTOR | Freq: Every day | SUBCUTANEOUS | 5 refills | Status: DC

## 2021-07-12 MED ORDER — ASPIRIN 81 MG PO TBEC
81.0000 mg | DELAYED_RELEASE_TABLET | Freq: Every day | ORAL | Status: DC
Start: 2021-07-13 — End: 2021-07-14
  Administered 2021-07-13 – 2021-07-14 (×2): 81 mg via ORAL
  Filled 2021-07-12 (×2): qty 1

## 2021-07-12 NOTE — Assessment & Plan Note (Deleted)
Observation telemetry bed. Pt without significant rales on exam. Mostly dry lower lobe rales. Scr up from baseline. Cardiology consulted. Appears that [place] was planned. I think pt's LE edema due to undertreated/undiagnosed pulmonary hypertension. Defer diuresis to cardiology. Will hold ARB given pt's likely need for IV contrast during this admission. Npo after MN in case cardiology can get [place] posted for tomorrow. ?

## 2021-07-12 NOTE — Assessment & Plan Note (Signed)
Hold norvasc and ARB. uptitrate her lopressor. Hold on diuresis for now. Defer to cardiology for diuresis management. Scr up from her baseline of 0.77 ?

## 2021-07-12 NOTE — Assessment & Plan Note (Signed)
Continue statin. 

## 2021-07-12 NOTE — Subjective & Objective (Addendum)
CC: LE edema ?HPI: ?81 year old African-American female history of coronary disease, type 2 diabetes on insulin, reflux, hyperlipidemia, chronic hypoxic respiratory failure on 4 L of oxygen since February 2023 presents to the ER today with 3-week history of lower extremity edema.  Patient has been on p.o. Lasix at home.  Pulmonary and cardiology have both seen the patient as an outpatient.  Questionable if the patient has interstitial lung disease versus pulm hypertension..  Echocardiogram echocardiogram performed in January 2023 showed recovered EF of 65 to 70%.  There is flattening of the interventricular septum consistent with RV pressure and volume overload.  RV was moderately enlarged.  There is severely elevated pulmonary artery systolic pressures estimated right ventricular systolic pressure 09.1.  There is a thickened aortic valve but without aortic stenosis. ? ?Patient states that lower extremity has not improved on Lasix.  Her O2 saturations are in the mid 80s percent.  She is not complaining of any shortness of breath however.  She only states that she has dyspnea when trying to move around.  At rest even though her sats are 85%, the patient denies any dyspnea. ? ?Patient is not any chest pain.  No nausea, vomiting, diarrhea. ? ?Patient states that she worked 40 years at [employer].  She worked the Clinical research associate.  No exposure to petrochemical's.  Just cotton dust.  No exposure to asbestos or silica.  Quit smoking 17 years ago. ? ?On arrival to the ER, temp 98.3 heart rate 90 blood pressure 122/73 satting 87% on on 4 L.  O2 saturations turned up to 6 L.  Saturations still about mid 88%. ? ?Chest x-ray which I personally reviewed negative for pulmonary edema. ? ?Labs: ? ?Sodium 142, testing 2.6, BUN of 11, creatinine 1.0.  Baseline creatinine 0.77 in February 2023. ? ?BNP elevated at 727.  No other prior BMPs were available. ? ?EKG which I personally reviewed shows normal sinus  rhythm. ? ?[facility] hospitalist consulted for admission. ?

## 2021-07-12 NOTE — Assessment & Plan Note (Addendum)
Hypokalemia, Hypomagnesemia.   Patient responded well to diuresis, at the time of her discharge her renal function has a serum cr of 0,89 with K at 4,1 and serum bicarbonate at 23.  Plan to continue diuresis with furosemide 40 mg daily and added dapagliflozin.

## 2021-07-12 NOTE — ED Triage Notes (Signed)
Patient reports shortness of breath for a few years. Was started on home oxygen in February- wears 4L [place]. Was told to take 2 of her fluid pills yesterday due to lower leg edema. Patient speaking in full sentences.  ?

## 2021-07-12 NOTE — Assessment & Plan Note (Addendum)
Continue with antiacid therapy.  ?

## 2021-07-12 NOTE — Telephone Encounter (Signed)
Called and spoke to [place] at [facility] ER triage and he will be looking for patient to arrive due to Elevated BNP and swelling and SOB.  ?

## 2021-07-12 NOTE — Assessment & Plan Note (Addendum)
Her glucose remained stable during her hospitalization.   She was placed on insulin sliding scale for glucose cover and monitoring.

## 2021-07-12 NOTE — ED Provider Triage Note (Signed)
Emergency Medicine Provider Triage Evaluation Note ? ?Jessica Hart , a 81 y.o. female  was evaluated in triage.  Pt complains of increasing lower extremity edema.  Also complains of chronic back slowly increasing shortness of breath.  Recently started on Lasix 40 mg.  Was told to take 2 doses yesterday.  Took 1 dose this morning.  Also noted 1 episode of vomiting yesterday and one earlier this morning.  Denies active nausea or vomiting.  Denies recent fever or chills, upper respiratory infection, diarrhea or constipation.  Denies active or recent chest pain. ? ?Hx of CABG, CAD, DM T2, emphysema, HTN, HLD, GERD, former tobacco use. ? ?Review of Systems  ?Positive: As above ?Negative: As above ? ?Physical Exam  ?BP 122/73   Pulse 89   Temp 98.3 ?F (36.8 ?C) (Oral)   Resp 16   Ht _0  (1.6 m)   Wt 74.1 kg   SpO2 93%   BMI 28.95 kg/m?  ?Gen:   Awake, no distress, sitting comfortably ?Resp:  Normal effort, on 4 L of O2 ?MSK:   Moves extremities without difficulty  ?Other:  2+ pitting edema bilateral extremities to the patella.  DP and radial pulses 2+ bilaterally.  Sensation intact of all extremities.  RRR without M/R/G. ? ?Medical Decision Making  ?Medically screening exam initiated at 4:06 PM.  Appropriate orders placed.  Jessica Hart was informed that the remainder of the evaluation will be completed by another provider, this initial triage assessment does not replace that evaluation, and the importance of remaining in the ED until their evaluation is complete. ? ?Labs, imaging, EKG ordered. ?  ?Prince Rome, PA-C ?87/19/59 1614 ? ?

## 2021-07-12 NOTE — H&P (Addendum)
?History and Physical  ? ? ?Jessica Hart NAT:[id number] DOB: 04-08-1940 DOA: 07/12/2021 ? ?DOS: the patient was seen and examined on 07/12/2021 ? ?PCP: Carlena Hurl, PA-C  ? ?Patient coming from: Home ? ?I have personally briefly reviewed patient's old medical records in [place] ? ?CC: LE edema ?HPI: ?81 year old African-American female history of coronary disease, type 2 diabetes on insulin, reflux, hyperlipidemia, chronic hypoxic respiratory failure on 4 L of oxygen since February 2023 presents to the ER today with 3-week history of lower extremity edema.  Patient has been on p.o. Lasix at home.  Pulmonary and cardiology have both seen the patient as an outpatient.  Questionable if the patient has interstitial lung disease versus pulm hypertension..  Echocardiogram echocardiogram performed in January 2023 showed recovered EF of 65 to 70%.  There is flattening of the interventricular septum consistent with RV pressure and volume overload.  RV was moderately enlarged.  There is severely elevated pulmonary artery systolic pressures estimated right ventricular systolic pressure 42.7.  There is a thickened aortic valve but without aortic stenosis. ? ?Patient states that lower extremity has not improved on Lasix.  Her O2 saturations are in the mid 80s percent.  She is not complaining of any shortness of breath however.  She only states that she has dyspnea when trying to move around.  At rest even though her sats are 85%, the patient denies any dyspnea. ? ?Patient is not any chest pain.  No nausea, vomiting, diarrhea. ? ?Patient states that she worked 40 years at [employer].  She worked the Clinical research associate.  No exposure to petrochemical's.  Just cotton dust.  No exposure to asbestos or silica.  Quit smoking 17 years ago. ? ?On arrival to the ER, temp 98.3 heart rate 90 blood pressure 122/73 satting 87% on on 4 L.  O2 saturations turned up to 6 L.  Saturations still about mid  88%. ? ?Chest x-ray which I personally reviewed negative for pulmonary edema. ? ?Labs: ? ?Sodium 142, testing 2.6, BUN of 11, creatinine 1.0.  Baseline creatinine 0.77 in February 2023. ? ?BNP elevated at 727.  No other prior BMPs were available. ? ?EKG which I personally reviewed shows normal sinus rhythm. ? ?[facility] hospitalist consulted for admission.  ? ?ED Course: BNP elevated at 727. CXR negative for pulmonary edema. Scr up to 1.06. baseline 0.77 ? ?Review of Systems:  ?Review of Systems  ?Constitutional: Negative.   ?HENT: Negative.    ?Respiratory:  Negative for shortness of breath.   ?Cardiovascular:  Positive for leg swelling. Negative for chest pain, palpitations and orthopnea.  ?Gastrointestinal: Negative.   ?Genitourinary: Negative.   ?Musculoskeletal: Negative.   ?Skin: Negative.   ?Neurological: Negative.   ?Endo/Heme/Allergies: Negative.   ?Psychiatric/Behavioral: Negative.    ?All other systems reviewed and are negative. ? ?Past Medical History:  ?Diagnosis Date  ? Arthritis   ? Cataract bilaterl 3 years ago  ? Coronary artery disease   ? Diabetes mellitus 02/27/2005  ? Diabetes mellitus type 2, controlled, without complications ([place]) 0/62/3762  ? Qualifier: Diagnosis of  By: Drucie Ip    ? Diverticulosis   ? Dyspnea   ? Echocardiogram findings abnormal, without diagnosis 2005  ? EF 47%, no ischemia, no infarction  ? Former smoker 01/10/2017  ? 45 pack year history, quit in 2007   ? GERD (gastroesophageal reflux disease)   ? Heart murmur   ? 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 History of bone density study  2006  ? Lt hip = -1.0, L spine = -1.8   ? Hyperlipidemia   ? Hypertension   ? ? ?Past Surgical History:  ?Procedure Laterality Date  ? ABDOMINAL HYSTERECTOMY  1989  ? one ovary remains per patient  ? CARDIAC CATHETERIZATION  08/20/2019  ? COLONOSCOPY    ? CORONARY ARTERY BYPASS GRAFT N/A 09/02/2019  ? Procedure: CORONARY ARTERY BYPASS GRAFTING (CABG) TIMES THREE USING LEFT INTERNAL MAMMARY ARTERY AND  RIGHT GREATER SAPHENOUS VEIN;  Surgeon: Lajuana Matte, MD;  Location: [place];  Service: Open Heart Surgery;  Laterality: N/A;  ? ENDOVEIN HARVEST OF GREATER SAPHENOUS VEIN Right 09/02/2019  ? Procedure: ENDOVEIN HARVEST OF GREATER SAPHENOUS VEIN;  Surgeon: Lajuana Matte, MD;  Location: [place];  Service: Open Heart Surgery;  Laterality: Right;  ? EYE SURGERY Bilateral   ? CATARACT  ? LEFT HEART CATH AND CORONARY ANGIOGRAPHY N/A 08/20/2019  ? Procedure: LEFT HEART CATH AND CORONARY ANGIOGRAPHY;  Surgeon: Belva Crome, MD;  Location: [place] [facility];  Service: Cardiovascular;  Laterality: N/A;  ? TEE WITHOUT CARDIOVERSION N/A 09/02/2019  ? Procedure: TRANSESOPHAGEAL ECHOCARDIOGRAM (TEE);  Surgeon: Lajuana Matte, MD;  Location: [place];  Service: Open Heart Surgery;  Laterality: N/A;  ? TONSILECTOMY, ADENOIDECTOMY, BILATERAL MYRINGOTOMY AND TUBES  1965  ? TONSILLECTOMY    ? ? ? reports that she quit smoking about 15 years ago. Her smoking use included cigarettes. She has a 45.00 pack-year smoking history. She quit smokeless tobacco use about 16 years ago. She reports that she does not currently use alcohol. She reports that she does not use drugs. ? ?Allergies  ?Allergen Reactions  ? Lisinopril Itching and Cough  ? ? ?Family History  ?Problem Relation Age of Onset  ? Heart disease Father   ? Cancer Sister   ? Tongue cancer Sister   ? Colon polyps Neg Hx   ? Rectal cancer Neg Hx   ? Stomach cancer Neg Hx   ? ? ?Prior to Admission medications   ?Medication Sig Start Date End Date Taking? Authorizing Provider  ?acetaminophen (TYLENOL) 500 MG tablet Take 2 tablets (1,000 mg total) by mouth every 8 (eight) hours as needed. ?Patient taking differently: Take 1,000 mg by mouth 2 (two) times daily. 09/07/19   Nani Skillern, PA-C  ?amLODipine (NORVASC) 10 MG tablet Take 1 tablet (10 mg total) by mouth daily. 01/31/21   Tysinger, Camelia Eng, PA-C  ?aspirin EC 81 MG tablet Take 1 tablet (81 mg total) by mouth  daily. Swallow whole. 02/09/21   Tysinger, Camelia Eng, PA-C  ?Cholecalciferol (D3 5000) 125 MCG (5000 UT) capsule Take 5,000 Units by mouth daily.    [provider]  ?clotrimazole-betamethasone (LOTRISONE) cream Apply 1 application topically daily. 02/08/21   Tysinger, Camelia Eng, PA-C  ?CREON 36000-114000 units CPEP capsule TAKE 2 CAPSULES BY MOUTH 3 TIMES A DAY WITH MEALS AND TAKE 1 CAPSULE WITH EACH SNACK TWICE DAILY 06/06/21   Mauri Pole, MD  ?dapagliflozin propanediol (FARXIGA) 5 MG TABS tablet Take 1 tablet (5 mg total) by mouth daily. 02/09/21   Tysinger, Camelia Eng, PA-C  ?Fluticasone-Umeclidin-Vilant (TRELEGY ELLIPTA) 100-62.5-25 MCG/ACT AEPB Inhale 1 puff into the lungs daily. 02/08/21   Tysinger, Camelia Eng, PA-C  ?furosemide (LASIX) 40 MG tablet Take 1 tablet (40 mg total) by mouth daily. 05/24/21   Elouise Munroe, MD  ?insulin degludec (TRESIBA FLEXTOUCH) 100 UNIT/ML FlexTouch Pen Inject 15 Units into the skin daily after supper.  07/12/21   Tysinger, Camelia Eng, PA-C  ?Insulin Pen Needle (PEN NEEDLES 5/16") 30G X 8 MM MISC Pen needles used for lantus injections 05/04/15   Henson, Vickie L, NP-C  ?Insulin Pen Needle (PEN NEEDLES) 32G X 4 MM MISC 1 each by Other route daily. 02/09/21   Tysinger, Camelia Eng, PA-C  ?losartan (COZAAR) 50 MG tablet Take 1 tablet (50 mg total) by mouth daily. 02/02/21   Tysinger, Camelia Eng, PA-C  ?metFORMIN (GLUCOPHAGE-XR) 750 MG 24 hr tablet TAKE 1 TABLET BY MOUTH EVERY DAY WITH BREAKFAST 04/19/21   Tysinger, Camelia Eng, PA-C  ?Metoprolol Tartrate 37.5 MG TABS Take 1 tablet by mouth 2 (two) times daily. ?Patient not taking: Reported on 07/11/2021 03/25/21   [provider]  ?Misc. Devices MISC SIG: 1 L of Oxygen via Nasal cannula around the clock Dispense Home Oxygen and Portable tank and necessary supplies. Dx: Hypoxemia, Shortness of breath ?Patient taking differently: SIG: 3.5 L of Oxygen via Nasal cannula around the clock Dispense Home Oxygen and Portable tank and  necessary supplies. Dx: Hypoxemia, Shortness of breath 02/09/21   Tysinger, Camelia Eng, PA-C  ?omeprazole (PRILOSEC) 40 MG capsule TAKE 1 CAPSULE BY MOUTH EVERY DAY 04/19/21   Tysinger, Camelia Eng, PA-C  ?OneTouch Delica Lancets

## 2021-07-12 NOTE — Consult Note (Addendum)
Cardiology Consultation:   Patient ID: Jessica Hart MRN: [id number]; DOB: 04-04-1940  Admit date: 07/12/2021 Date of Consult: 07/12/2021  PCP:  Jac Canavan, PA-C   [facility] Providers Cardiologist:  Parke Poisson, MD   {    Patient Profile:   Jessica Hart is a 81 y.o. female with a hx of DM2, HTN, HL, CAD with prior CABG, chronic resp failure on 4l [place], pulm HTN who is being seen 07/12/2021 for the evaluation of SOB at the request of Dr Imogene Burn.  History of Present Illness:   Jessica Hart 81 yo female history of DM2, HTN, HL, CAD with prior CABG 08/2019, pulmonary HTN with RV dysfunction, chronic respiratory failure on 4 L [place], admitted with progressing LE edema and SOB. Reports compliance with lasix at home.   BNP 658 K 3.3 Cr 0.85 BUN 11 CXR chronic intersitial opacities  01/2020 echo: LVEF 60-65%, no WMAs, normal RV function and PA pressures.   Jan 2023 echo: LVEF 65-70%, grade I dd with elevated LA pressure, Dshaped septum, mild RV dysfunction and mod RVE, severe pulm HTN PASP 67, mod TR.   04/2021 CT high res: intersitial lung disease   Past Medical History:  Diagnosis Date   Arthritis    Cataract bilaterl 3 years ago   Coronary artery disease    Diabetes mellitus 02/27/2005   Diabetes mellitus type 2, controlled, without complications ([facility]) 04/26/2006   Qualifier: Diagnosis of  By: Haydee Salter     Diverticulosis    Dyspnea    Echocardiogram findings abnormal, without diagnosis 2005   EF 47%, no ischemia, no infarction   Former smoker 01/10/2017   45 pack year history, quit in 2007    GERD (gastroesophageal reflux disease)    Heart murmur    History of bone density study 2006   Lt hip = -1.0, L spine = -1.8    Hyperlipidemia    Hypertension     Past Surgical History:  Procedure Laterality Date   ABDOMINAL HYSTERECTOMY  1989   one ovary remains per patient   CARDIAC CATHETERIZATION  08/20/2019   COLONOSCOPY     CORONARY ARTERY BYPASS  GRAFT N/A 09/02/2019   Procedure: CORONARY ARTERY BYPASS GRAFTING (CABG) TIMES THREE USING LEFT INTERNAL MAMMARY ARTERY AND RIGHT GREATER SAPHENOUS VEIN;  Surgeon: Corliss Skains, MD;  Location[facility];  Service: Open Heart Surgery;  Laterality: N/A;   ENDOVEIN HARVEST OF GREATER SAPHENOUS VEIN Right 09/02/2019   Procedure: ENDOVEIN HARVEST OF GREATER SAPHENOUS VEIN;  Surgeon: Corliss Skains, MD;  Location: [facility];  Service: Open Heart Surgery;  Laterality: Right;   EYE SURGERY Bilateral    CATARACT   LEFT HEART CATH AND CORONARY ANGIOGRAPHY N/A 08/20/2019   Procedure: LEFT HEART CATH AND CORONARY ANGIOGRAPHY;  Surgeon: Lyn Records, MD;  [facility]: [facility];  Service: Cardiovascular;  Laterality: N/A;   TEE WITHOUT CARDIOVERSION N/A 09/02/2019   Procedure: TRANSESOPHAGEAL ECHOCARDIOGRAM (TEE);  Surgeon: Corliss Skains, MD;  [facility];  Service: Open Heart Surgery;  Laterality: N/A;   TONSILECTOMY, ADENOIDECTOMY, BILATERAL MYRINGOTOMY AND TUBES  1965   TONSILLECTOMY        Inpatient Medications: Scheduled Meds:  Continuous Infusions:  potassium chloride 10 mEq (07/12/21 1927)   PRN Meds:   Allergies:    Allergies  Allergen Reactions   Lisinopril Itching and Cough    Social History:   Social History   Socioeconomic History   Marital status:  Married    Spouse name: Richard   Number of children: 1   Years of education: 12   Highest education level: High school graduate  Occupational History   Occupation: Retire-Textile    Employer: RETIRED  Tobacco Use   Smoking status: Former    Packs/day: 1.00    Years: 45.00    Pack years: 45.00    Types: Cigarettes    Quit date: 07/27/2005    Years since quitting: 15.9   Smokeless tobacco: Former    Quit date: 03/2005  Vaping Use   Vaping Use: Never used  Substance and Sexual Activity   Alcohol use: Not Currently    Comment: beer occasionally    Drug use: No   Sexual activity: Not Currently     Partners: Male    Comment: 1st intercourse-22, partners- 5, married- 19 yrs   Other Topics Concern   Not on file  Social History Narrative   Health Care POA:    Emergency Contact: husband, Richard Production manager   End of Life Plan:    Who lives with you: Lives with husband   Any pets: gold fish   Diet: Patient has a varied diet and reports stuggeling with portion size and ice cream.   Exercise: Patient does not have currently exercise routine.   Seatbelts: Patient reports wearing seatbelt when in vehicle.   Sun Exposure/Protection: Patient reports not using sun protection.   Hobbies: Bingo         Social Determinants of Corporate investment banker Strain: Low Risk    Difficulty of Paying Living Expenses: Not hard at all  Food Insecurity: No Food Insecurity   Worried About Programme researcher, broadcasting/film/video in the Last Year: Never true   Barista in the Last Year: Never true  Transportation Needs: No Transportation Needs   Lack of Transportation (Medical): No   Lack of Transportation (Non-Medical): No  Physical Activity: Inactive   Days of Exercise per Week: 0 days   Minutes of Exercise per Session: 0 min  Stress: No Stress Concern Present   Feeling of Stress : Not at all  Social Connections: Not on file  Intimate Partner Violence: Not on file    Family History:    Family History  Problem Relation Age of Onset   Heart disease Father    Cancer Sister    Tongue cancer Sister    Colon polyps Neg Hx    Rectal cancer Neg Hx    Stomach cancer Neg Hx      ROS:  Please see the history of present illness.   All other ROS reviewed and negative.     Physical Exam/Data:   Vitals:   07/12/21 1540 07/12/21 1815 07/12/21 1830 07/12/21 1845  BP:  115/77 137/71 125/87  Pulse: 89 87 84 80  Resp:  20 20 (!) 22  Temp:      TempSrc:      SpO2: 93% (!) 87% (!) 88% (!) 87%  Weight:      Height:       No intake or output data in the 24 hours ending 07/12/21 1933    07/12/2021    3:39 PM  07/11/2021    3:07 PM 06/12/2021    9:04 PM  Last 3 Weights  Weight (lbs) 163 lb 6.4 oz 163 lb 6.4 oz 163 lb  Weight (kg) 74.118 kg 74.118 kg 73.936 kg     Body mass index is 28.95 kg/m.  General:  Well nourished, well developed, in no acute distress HEENT: normal Neck: mildly elevated JVD Vascular: No carotid bruits; Distal pulses 2+ bilaterally Cardiac:  normal S1, S2; RRR; no murmur  Lungs:  bilateral crackles Abd: soft, nontender, no hepatomegaly  Ext: 2+ bilateral LE edema Musculoskeletal:  No deformities, BUE and BLE strength normal and equal Skin: warm and dry  Neuro:  CNs 2-12 intact, no focal abnormalities noted Psych:  Normal affect     Laboratory Data:  High Sensitivity Troponin:  No results for input(s): TROPONINIHS in the last 720 hours.   Chemistry Recent Labs  Lab 07/11/21 1535 07/12/21 1546  NA 142 142  K 3.3* 2.6*  CL 103 108  CO2 20 21*  GLUCOSE 114* 111*  BUN 11 11  CREATININE 0.85 1.06*  CALCIUM 9.2 8.9  GFRNONAA  --  53*  ANIONGAP  --  13    No results for input(s): PROT, ALBUMIN, AST, ALT, ALKPHOS, BILITOT in the last 168 hours. Lipids No results for input(s): CHOL, TRIG, HDL, LABVLDL, LDLCALC, CHOLHDL in the last 168 hours.  Hematology Recent Labs  Lab 07/12/21 1546  WBC 9.1  RBC 4.97  HGB 13.8  HCT 42.5  MCV 85.5  MCH 27.8  MCHC 32.5  RDW 16.7*  PLT 237   Thyroid No results for input(s): TSH, FREET4 in the last 168 hours.  BNP Recent Labs  Lab 07/11/21 1535 07/12/21 1546  BNP 658.5* 727.3*    DDimer No results for input(s): DDIMER in the last 168 hours.   Radiology/Studies:  DG Chest 1 View  Result Date: 07/12/2021 CLINICAL DATA:  Shortness of breath with swelling. EXAM: CHEST  1 VIEW COMPARISON:  02/08/2021. FINDINGS: Chronic interstitial opacities. No confluent consolidation. No visible pleural effusions or pneumothorax. Enlarged cardiac silhouette. Median sternotomy and CABG. Polyarticular degenerative change.  IMPRESSION: 1. Chronic interstitial opacities, probably in part due to the interstitial lung disease seen on prior CT chest. Superimposed mild interstitial edema is not excluded. 2. Cardiomegaly. Electronically Signed   By: Feliberto Harts M.D.   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   Followed closely by Dr Jacques Navy and Dr Judeth Horn. There have been ongoing discussions about possible RHC based on notes though not clear -Thought to be mainly group III pulmonary HTN(COPD, intersitial lung disease on CT), perhaps component of group II given diastolic dysfunction.   -presents volume overloaded. HOld IV lasix tonight as markedly hypokalamic, would start tomorrow AM - likely checkwith Dr Jacques Navy and [facility] tomorrow if recs are for RHC this admission once more euvolemic.          For questions or updates, please contact [facility] Please consult [email or web address] for contact info under    Signed, Dina Rich, MD  07/12/2021 7:33 PM

## 2021-07-12 NOTE — Assessment & Plan Note (Signed)
Pt states she has only been on supplemental O2 since Feb 2023. Pulmonary notes possible ILD vs PH as likely cause of her hypoxia.  PFTs show severe reduced DLCO on only mild restriction but no obstructive disease. ?

## 2021-07-12 NOTE — ED Provider Notes (Signed)
[facility] EMERGENCY DEPARTMENT Provider Note   CSN: [id number] Arrival date & time: 07/12/21  1522     History  Chief Complaint  Patient presents with   Shortness of Breath    Jessica Hart is a 81 y.o. female.  HPI Patient has history of chronic respiratory failure on 4 L of oxygen.  Other comorbid conditions include insulin-dependent diabetes and coronary artery disease.  Patient has had gradual worsening of lower extremity swelling.  Symptoms have been present for about 2 to 3 weeks.  Patient has been prescribed Lasix to try at home with no improvement.  Patient denies she is experiencing any chest pain.  She is noting some increase in shortness of breath with activity.  She does not notice significant increase at rest.  Patient is more hypoxic than baseline.    Home Medications Prior to Admission medications   Medication Sig Start Date End Date Taking? Authorizing Provider  acetaminophen (TYLENOL) 500 MG tablet Take 2 tablets (1,000 mg total) by mouth every 8 (eight) hours as needed. Patient taking differently: Take 1,000 mg by mouth 2 (two) times daily. 09/07/19   Nani Skillern, PA-C  amLODipine (NORVASC) 10 MG tablet Take 1 tablet (10 mg total) by mouth daily. 01/31/21   Tysinger, Camelia Eng, PA-C  aspirin EC 81 MG tablet Take 1 tablet (81 mg total) by mouth daily. Swallow whole. 02/09/21   Tysinger, Camelia Eng, PA-C  Cholecalciferol (D3 5000) 125 MCG (5000 UT) capsule Take 5,000 Units by mouth daily.    [provider]  clotrimazole-betamethasone (LOTRISONE) cream Apply 1 application topically daily. 02/08/21   Tysinger, Camelia Eng, PA-C  CREON [phone] units CPEP capsule TAKE 2 CAPSULES BY MOUTH 3 TIMES A DAY WITH MEALS AND TAKE 1 CAPSULE WITH EACH SNACK TWICE DAILY 06/06/21   Mauri Pole, MD  dapagliflozin propanediol (FARXIGA) 5 MG TABS tablet Take 1 tablet (5 mg total) by mouth daily. 02/09/21   Tysinger, Camelia Eng, PA-C   Fluticasone-Umeclidin-Vilant (TRELEGY ELLIPTA) 100-62.5-25 MCG/ACT AEPB Inhale 1 puff into the lungs daily. 02/08/21   Tysinger, Camelia Eng, PA-C  furosemide (LASIX) 40 MG tablet Take 1 tablet (40 mg total) by mouth daily. 05/24/21   Elouise Munroe, MD  insulin degludec (TRESIBA FLEXTOUCH) 100 UNIT/ML FlexTouch Pen Inject 15 Units into the skin daily after supper. 07/12/21   Tysinger, Camelia Eng, PA-C  Insulin Pen Needle (PEN NEEDLES 5/16") 30G X 8 MM MISC Pen needles used for lantus injections 05/04/15   Henson, Vickie L, NP-C  Insulin Pen Needle (PEN NEEDLES) 32G X 4 MM MISC 1 each by Other route daily. 02/09/21   Tysinger, Camelia Eng, PA-C  losartan (COZAAR) 50 MG tablet Take 1 tablet (50 mg total) by mouth daily. 02/02/21   Tysinger, Camelia Eng, PA-C  metFORMIN (GLUCOPHAGE-XR) 750 MG 24 hr tablet TAKE 1 TABLET BY MOUTH EVERY DAY WITH BREAKFAST 04/19/21   Tysinger, Camelia Eng, PA-C  Metoprolol Tartrate 37.5 MG TABS Take 1 tablet by mouth 2 (two) times daily. Patient not taking: Reported on 07/11/2021 03/25/21   [provider]  Misc. Devices MISC SIG: 1 L of Oxygen via Nasal cannula around the clock Dispense Home Oxygen and Portable tank and necessary supplies. Dx: Hypoxemia, Shortness of breath Patient taking differently: SIG: 3.5 L of Oxygen via Nasal cannula around the clock Dispense Home Oxygen and Portable tank and necessary supplies. Dx: Hypoxemia, Shortness of breath 02/09/21   Tysinger, Camelia Eng, PA-C  omeprazole (PRILOSEC)  40 MG capsule TAKE 1 CAPSULE BY MOUTH EVERY DAY 04/19/21   Tysinger, Camelia Eng, PA-C  OneTouch Delica Lancets 65H MISC USE TO TEST BLOOD SUGAR ONCE A DAY 12/26/19   Henson, Vickie L, NP-C  ONETOUCH VERIO test strip USE 1 STRIP TO CHECK GLUCOSE ONCE DAILY 09/28/20   Henson, Vickie L, NP-C  potassium chloride (KLOR-CON) 10 MEQ tablet Take 1 tablet (10 mEq total) by mouth daily. 07/12/21   Tysinger, Camelia Eng, PA-C  rosuvastatin (CRESTOR) 20 MG tablet Take 1 tablet (20 mg total) by mouth  daily. 02/09/21 07/11/21  Tysinger, Camelia Eng, PA-C  vitamin B-12 (CYANOCOBALAMIN) 500 MCG tablet Take 500 mcg by mouth daily.    [provider]      Allergies    Lisinopril    Review of Systems   Review of Systems 10 systems reviewed negative except as per HPI Physical Exam Updated Vital Signs BP 125/87   Pulse 80   Temp 98.3 F (36.8 C) (Oral)   Resp (!) 22   Ht _0  (1.6 m)   Wt 74.1 kg   SpO2 (!) 87%   BMI 28.95 kg/m  Physical Exam Constitutional:      Comments: Alert nontoxic.  Clear mental status.  No acute distress.  Mild increased work of breathing at rest  HENT:     Mouth/Throat:     Pharynx: Oropharynx is clear.  Eyes:     Extraocular Movements: Extraocular movements intact.  Neck:     Comments: Positive JVD Cardiovascular:     Rate and Rhythm: Normal rate and regular rhythm.  Pulmonary:     Comments: Mild increased work of breathing at rest.  Breath sounds slightly soft.  No gross crackle or wheeze.  Soft crackles at the bases. Abdominal:     General: There is no distension.     Palpations: Abdomen is soft.     Tenderness: There is no abdominal tenderness. There is no guarding.  Musculoskeletal:     Comments: 2+ edema symmetric bilateral lower extremities.  No erythema or wounds.  Skin:    General: Skin is warm and dry.  Neurological:     General: No focal deficit present.     Mental Status: She is oriented to person, place, and time.     Coordination: Coordination normal.  Psychiatric:        Mood and Affect: Mood normal.    ED Results / Procedures / Treatments   Labs (all labs ordered are listed, but only abnormal results are displayed) Labs Reviewed  BASIC METABOLIC PANEL - Abnormal; Notable for the following components:      Result Value   Potassium 2.6 (*)    CO2 21 (*)    Glucose, Bld 111 (*)    Creatinine, Ser 1.06 (*)    GFR, Estimated 53 (*)    All other components within normal limits  BRAIN NATRIURETIC PEPTIDE - Abnormal;  Notable for the following components:   B Natriuretic Peptide 727.3 (*)    All other components within normal limits  CBC WITH DIFFERENTIAL/PLATELET - Abnormal; Notable for the following components:   RDW 16.7 (*)    All other components within normal limits  TROPONIN I (HIGH SENSITIVITY)    EKG EKG Interpretation  Date/Time:  Tuesday Jul 12 2021 15:35:12 EDT Ventricular Rate:  89 PR Interval:  160 QRS Duration: 82 QT Interval:  358 QTC Calculation: 435 R Axis:   28 Text Interpretation: Normal sinus rhythm Possible Left atrial  enlargement No significant change since last tracing When compared with ECG of 03-Sep-2019 06:35, PREVIOUS ECG IS PRESENT Confirmed by Kommor, Madison ([id number]) on 07/13/2021 4:55:29 PM  Radiology DG Chest 1 View  Result Date: 07/12/2021 CLINICAL DATA:  Shortness of breath with swelling. EXAM: CHEST  1 VIEW COMPARISON:  02/08/2021. FINDINGS: Chronic interstitial opacities. No confluent consolidation. No visible pleural effusions or pneumothorax. Enlarged cardiac silhouette. Median sternotomy and CABG. Polyarticular degenerative change. IMPRESSION: 1. Chronic interstitial opacities, probably in part due to the interstitial lung disease seen on prior CT chest. Superimposed mild interstitial edema is not excluded. 2. Cardiomegaly. Electronically Signed   By: Margaretha Sheffield M.D.   On: 07/12/2021 16:33    Procedures Procedures   CRITICAL CARE Performed by: Charlesetta Shanks   Total critical care time: 30 minutes  Critical care time was exclusive of separately billable procedures and treating other patients.  Critical care was necessary to treat or prevent imminent or life-threatening deterioration.  Critical care was time spent personally by me on the following activities: development of treatment plan with patient and/or surrogate as well as nursing, discussions with consultants, evaluation of patient's response to treatment, examination of patient, obtaining  history from patient or surrogate, ordering and performing treatments and interventions, ordering and review of laboratory studies, ordering and review of radiographic studies, pulse oximetry and re-evaluation of patient's condition.  Medications Ordered in ED Medications  potassium chloride 10 mEq in 100 mL IVPB (10 mEq Intravenous New Bag/Given 07/12/21 1927)  potassium chloride SA (KLOR-CON M) CR tablet 40 mEq (40 mEq Oral Given 07/12/21 1927)    ED Course/ Medical Decision Making/ A&P                           Medical Decision Making Risk Prescription drug management. Decision regarding hospitalization.  Patient has multiple comorbid conditions.  At baseline she has chronic respiratory failure on 4 L of oxygen.  This is a somewhat newer diagnosis for the patient this year.  Review of EMR suggest working diagnosis of pulmonary hypertension\emphysema\early pulmonary fibrosis (thought less likely).  Patient is hypoxic compared to her baseline with oxygen saturations down to the mid to low 80s on her baseline 4 L.  Broad evaluation will be initiated for ACS\CHF\pneumonia\worsening acute on chronic respiratory failure.  Patient had also been seen by cardiology with recommendation for right heart cath.  Patient is not having active chest pain and no acute EKG changes at this time.  Patient is hypokalemic.  Will replace with oral and IV potassium.  Plan for admission.  Consult: Dr. Bridgett Larsson, [facility] hospitalist for admission. Consult: Dr. Driscilla Moats cardiology.  Will consult.        Final Clinical Impression(s) / ED Diagnoses Final diagnoses:  Acute hypoxemic respiratory failure ([place])  Severe comorbid illness  Hypokalemia    Rx / DC Orders ED Discharge Orders     None         Charlesetta Shanks, MD 07/15/21 [phone]

## 2021-07-12 NOTE — Progress Notes (Signed)
Think a RHC is reasonable. Her candidacy for therapy is limited but would be willing to trial something if RHC supports it. Suspect additional work up can commence as outpatient. Thanks for letting me know! - [facility]

## 2021-07-12 NOTE — Progress Notes (Signed)
Hopefully, if she went to the ER and there was enough to admit her, she can be posted for right heart cath while she is there. ? ?Glenetta Hew, MD ?Will forward to out inpt coordinator ?

## 2021-07-12 NOTE — Assessment & Plan Note (Addendum)
No chest pain or angina.  Continue blood pressure control.

## 2021-07-13 ENCOUNTER — Encounter (HOSPITAL_COMMUNITY)
Admission: EM | Disposition: A | Discharge status: Home / Self Care | Payer: Self-pay | Source: Home / Self Care | Attending: Internal Medicine

## 2021-07-13 DIAGNOSIS — Z9981 Dependence on supplemental oxygen: Secondary | ICD-10-CM | POA: Diagnosis not present

## 2021-07-13 DIAGNOSIS — J9611 Chronic respiratory failure with hypoxia: Secondary | ICD-10-CM | POA: Diagnosis not present

## 2021-07-13 DIAGNOSIS — I2721 Secondary pulmonary arterial hypertension: Secondary | ICD-10-CM | POA: Diagnosis not present

## 2021-07-13 DIAGNOSIS — K219 Gastro-esophageal reflux disease without esophagitis: Secondary | ICD-10-CM | POA: Diagnosis not present

## 2021-07-13 DIAGNOSIS — E781 Pure hyperglyceridemia: Secondary | ICD-10-CM | POA: Diagnosis not present

## 2021-07-13 DIAGNOSIS — I272 Pulmonary hypertension, unspecified: Secondary | ICD-10-CM | POA: Diagnosis not present

## 2021-07-13 DIAGNOSIS — E1169 Type 2 diabetes mellitus with other specified complication: Secondary | ICD-10-CM | POA: Diagnosis not present

## 2021-07-13 HISTORY — PX: RIGHT AND LEFT HEART CATH: CATH118262

## 2021-07-13 LAB — COMPREHENSIVE METABOLIC PANEL
ALT: 21 U/L (ref 0–44)
AST: 28 U/L (ref 15–41)
Albumin: 3.5 g/dL (ref 3.5–5.0)
Alkaline Phosphatase: 102 U/L (ref 38–126)
Anion gap: 9 (ref 5–15)
BUN: 10 mg/dL (ref 8–23)
CO2: 20 mmol/L — ABNORMAL LOW (ref 22–32)
Calcium: 8.8 mg/dL — ABNORMAL LOW (ref 8.9–10.3)
Chloride: 110 mmol/L (ref 98–111)
Creatinine, Ser: 0.86 mg/dL (ref 0.44–1.00)
GFR, Estimated: >60 mL/min (ref >60)
Glucose, Bld: 90 mg/dL (ref 70–99)
Potassium: 3.2 mmol/L — ABNORMAL LOW (ref 3.5–5.1)
Sodium: 139 mmol/L (ref 135–145)
Total Bilirubin: 1.4 mg/dL — ABNORMAL HIGH (ref 0.3–1.2)
Total Protein: 6.9 g/dL (ref 6.5–8.1)

## 2021-07-13 LAB — CBC WITH DIFFERENTIAL/PLATELET
Abs Immature Granulocytes: 0.02 10*3/uL (ref 0.00–0.07)
Basophils Absolute: 0.1 10*3/uL (ref 0.0–0.1)
Basophils Relative: 1 %
Eosinophils Absolute: 0.2 10*3/uL (ref 0.0–0.5)
Eosinophils Relative: 2 %
HCT: 41 % (ref 36.0–46.0)
Hemoglobin: 13.4 g/dL (ref 12.0–15.0)
Immature Granulocytes: 0 %
Lymphocytes Relative: 16 %
Lymphs Abs: 1.3 10*3/uL (ref 0.7–4.0)
MCH: 27.9 pg (ref 26.0–34.0)
MCHC: 32.7 g/dL (ref 30.0–36.0)
MCV: 85.4 fL (ref 80.0–100.0)
Monocytes Absolute: 0.7 10*3/uL (ref 0.1–1.0)
Monocytes Relative: 8 %
Neutro Abs: 6.2 10*3/uL (ref 1.7–7.7)
Neutrophils Relative %: 73 %
Platelets: 239 10*3/uL (ref 150–400)
RBC: 4.8 MIL/uL (ref 3.87–5.11)
RDW: 16.6 % — ABNORMAL HIGH (ref 11.5–15.5)
WBC: 8.5 10*3/uL (ref 4.0–10.5)
nRBC: 0 % (ref 0.0–0.2)

## 2021-07-13 LAB — CBG MONITORING, ED
Glucose-Capillary: 120 mg/dL — ABNORMAL HIGH (ref 70–99)
Glucose-Capillary: 100 mg/dL — ABNORMAL HIGH (ref 70–99)

## 2021-07-13 LAB — GLUCOSE, CAPILLARY
Glucose-Capillary: 76 mg/dL (ref 70–99)
Glucose-Capillary: 171 mg/dL — ABNORMAL HIGH (ref 70–99)

## 2021-07-13 LAB — MAGNESIUM: Magnesium: 1.9 mg/dL (ref 1.7–2.4)

## 2021-07-13 SURGERY — RIGHT AND LEFT HEART CATH

## 2021-07-13 MED ORDER — MIDAZOLAM HCL 2 MG/2ML IJ SOLN
INTRAMUSCULAR | Status: DC | PRN
Start: 2021-07-13 — End: 2021-07-13
  Administered 2021-07-13: 1 mg via INTRAVENOUS

## 2021-07-13 MED ORDER — LIDOCAINE HCL (PF) 1 % IJ SOLN
INTRAMUSCULAR | Status: DC | PRN
  Administered 2021-07-13: 5 mL
  Administered 2021-07-13: 2 mL
  Administered 2021-07-13: 5 mL

## 2021-07-13 MED ORDER — MIDAZOLAM HCL 2 MG/2ML IJ SOLN
INTRAMUSCULAR | Status: AC
Start: 2021-07-13 — End: ?
  Filled 2021-07-13: qty 2

## 2021-07-13 MED ORDER — FENTANYL CITRATE (PF) 100 MCG/2ML IJ SOLN
INTRAMUSCULAR | Status: DC | PRN
  Administered 2021-07-13: 25 ug via INTRAVENOUS

## 2021-07-13 MED ORDER — POTASSIUM CHLORIDE CRYS ER 20 MEQ PO TBCR
60.0000 meq | EXTENDED_RELEASE_TABLET | Freq: Once | ORAL | Status: AC
  Administered 2021-07-13: 60 meq via ORAL
  Filled 2021-07-13: qty 3

## 2021-07-13 MED ORDER — SODIUM CHLORIDE 0.9% FLUSH
3.0000 mL | Freq: Two times a day (BID) | INTRAVENOUS | Status: DC
  Administered 2021-07-13 – 2021-07-17 (×9): 3 mL via INTRAVENOUS

## 2021-07-13 MED ORDER — VERAPAMIL HCL 2.5 MG/ML IV SOLN
INTRAVENOUS | Status: AC
  Filled 2021-07-13: qty 2

## 2021-07-13 MED ORDER — VERAPAMIL HCL 2.5 MG/ML IV SOLN
INTRAVENOUS | Status: DC | PRN
  Administered 2021-07-13: 5 mL via INTRA_ARTERIAL

## 2021-07-13 MED ORDER — HEPARIN (PORCINE) IN NACL 1000-0.9 UT/500ML-% IV SOLN
INTRAVENOUS | Status: DC | PRN
  Administered 2021-07-13: 500 mL

## 2021-07-13 MED ORDER — FENTANYL CITRATE (PF) 100 MCG/2ML IJ SOLN
INTRAMUSCULAR | Status: AC
  Filled 2021-07-13: qty 2

## 2021-07-13 MED ORDER — SODIUM CHLORIDE 0.9 % IV SOLN: INTRAVENOUS | Status: DC

## 2021-07-13 MED ORDER — FUROSEMIDE 10 MG/ML IJ SOLN
40.0000 mg | Freq: Once | INTRAMUSCULAR | Status: AC
  Administered 2021-07-13: 40 mg via INTRAVENOUS
  Filled 2021-07-13: qty 4

## 2021-07-13 MED ORDER — HEPARIN SODIUM (PORCINE) 1000 UNIT/ML IJ SOLN
INTRAMUSCULAR | Status: DC | PRN
  Administered 2021-07-13: 5000 [IU] via INTRAVENOUS

## 2021-07-13 MED ORDER — HEPARIN SODIUM (PORCINE) 1000 UNIT/ML IJ SOLN
INTRAMUSCULAR | Status: AC
Start: 2021-07-13 — End: ?
  Filled 2021-07-13: qty 10

## 2021-07-13 MED ORDER — LIDOCAINE HCL (PF) 1 % IJ SOLN
INTRAMUSCULAR | Status: AC
  Filled 2021-07-13: qty 30

## 2021-07-13 MED ORDER — SODIUM CHLORIDE 0.9 % IV SOLN
INTRAVENOUS | Status: DC
Start: 2021-07-13 — End: 2021-07-13

## 2021-07-13 MED ORDER — HEPARIN (PORCINE) IN NACL 1000-0.9 UT/500ML-% IV SOLN
INTRAVENOUS | Status: AC
  Filled 2021-07-13: qty 500

## 2021-07-13 SURGICAL SUPPLY — 12 items
CATH BALLN WEDGE 5F 110CM (CATHETERS) ×2 IMPLANT
CATH INFINITI 5FR ANG PIGTAIL (CATHETERS) ×1 IMPLANT
DEVICE RAD COMP TR BAND LRG (VASCULAR PRODUCTS) ×1 IMPLANT
GLIDESHEATH SLEND SS 6F .021 (SHEATH) ×1 IMPLANT
GUIDEWIRE .025 260CM (WIRE) ×1 IMPLANT
GUIDEWIRE INQWIRE 1.5J.035X260 (WIRE) IMPLANT
INQWIRE 1.5J .035X260CM (WIRE) ×2
KIT MICROPUNCTURE NIT STIFF (SHEATH) ×1 IMPLANT
SHEATH GLIDE SLENDER 4/5FR (SHEATH) ×1 IMPLANT
SHEATH PROBE COVER 6X72 (BAG) ×3 IMPLANT
TUBING ART PRESS 72  MALE/FEM (TUBING) ×2
TUBING ART PRESS 72 MALE/FEM (TUBING) IMPLANT

## 2021-07-13 NOTE — Interval H&P Note (Signed)
History and Physical Interval Note: ? ?07/13/2021 ?4:10 PM ? ?Jessica Hart  has presented today for surgery, with the diagnosis of pulmonary.  The various methods of treatment have been discussed with the patient and family. After consideration of risks, benefits and other options for treatment, the patient has consented to  Procedure(s): ?RIGHT HEART CATH (N/A) as a surgical intervention.  The patient's history has been reviewed, patient examined, no change in status, stable for surgery.  I have reviewed the patient's chart and labs.  Questions were answered to the patient's satisfaction.   ? ? ?Early [place] ? ? ?

## 2021-07-13 NOTE — Progress Notes (Signed)
?  Progress Note ? ? ?Patient: Jessica Hart AJG:[id number] DOB: [birth year] DOA: 07/12/2021     0 ?DOS: the patient was seen and examined on 07/13/2021 ?  ?Brief hospital course: ?[name] was admitted to the hospital with the working diagnosis of decompensated heart failure.  ? ?81 yo female with the past medical history of pulmonary hypertension, T2DM, dyslipidemia, chronic hypoxemic respiratory failure who presented with lower extremity edema. Reported 3 weeks of worsening symptoms, refractive to medical therapy as outpatient with furosemide. On her initial physical examination blood pressure 115/77, HR 87, RR 20 and 02 saturation 87%, lungs with rales but no wheezing, heart with S1 and S2 present and rhythmic, abdomen not distended and positive lower extremity edema.  ? ?Na 142, K 2,6 CL 108, bicarbonate 11, glucose 111 bun 11 cr 1,0 ?BNP 727  ?Wbc 9,1 hgb 13,8 plt 237  ? ?Chest radiograph with cardiomegaly and bilateral interstitial markings.  ? ?EKG 89 bpm, normal axis, normal intervals, sinus rhythm with q wave in lead III and AvF, no significant ST segment changes, negative T in lead I and AvL. ? ?Patient was placed on IV diuresis with furosemide.  ? ?Right heart catheterization for further work up.  ? ? ? ? ? ?Assessment and Plan: ?* Pulmonary artery hypertension (HCC) ?Acute on chronic diastolic heart failure.  ? ?Urine output is 1,400 cc documented.  ?Follow up on cath report  ?Out patient workup positive for pulmonary hypertension and right heart failure.  ? ? 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  ? ?GERD (gastroesophageal reflux disease) ?Continue with antiacid therapy.  ? ?S/P CABG x 3 ?No chest pain or angina.  ? ?Chronic respiratory failure with hypoxia, on home O2 therapy (HCC) - 4 L/min ?Pt states she has only been on supplemental O2 since Feb 2023. Pulmonary notes possible ILD vs PH as likely cause of her  hypoxia.  PFTs show severe reduced DLCO on only mild restriction but no obstructive disease. ? ?Hypokalemia ?Hypomagnesemia.  ?Renal function with serum cr is 0,86 with K at 3,2 and serum bicarbonate at 20. ? ?Plan to continue K correction with Kcl and Mg  ? ? ? ? ?  ? ?Subjective: Patient is feeling better, not yet back to baseline  ? ?Physical Exam: ?Vitals:  ? 07/13/21 1036 07/13/21 1045 07/13/21 1330 07/13/21 1530  ?BP: 133/82 118/88 92/62 (!) 115/97  ?Pulse: 89 78 73 77  ?Resp:  15 (!) 23 15  ?Temp:    98 ?F (36.7 ?C)  ?TempSrc:    Oral  ?SpO2:  90% (!) 87% (!) 89%  ?Weight:      ?Height:      ? ?Neurology awake and alert ?ENT with no pallor ?Cardiovascular with edema at the right neck, heart with S1 and S2 present and rhythmic, no gallops, positive systolic murmur at the right sternal border ?Lungs with no rales or wheezing  ?Abdomen soft and non tender ?Data Reviewed: ? ? ? ?Family Communication: no family at the bedside  ? ?Disposition: ?Status is: Observation ?The patient remains OBS appropriate and will d/c before 2 midnights. ? Planned Discharge Destination: Home ? ? ? ? ?Author: ?Tawni Millers, MD ?07/13/2021 5:43 PM ? ?For on call review [email or web address].  ?

## 2021-07-13 NOTE — Assessment & Plan Note (Addendum)
Acute on chronic diastolic heart failure.  Acute on chronic hypoxemic respiratory failure.   Cardiac catheterization with right atrial pressure 23/22 with mean 17, RV 67/21. PA 65/27, mean 44, LV end diastolic pressure 4 (surrogate for PCWP that was not able to obtain) and SVC 02 saturation 61%.  Pulmonary resistance is 10 woods.   CT chest with no pulmonary embolism. Diffuse ground glass pulmonary infiltrate, interlobular septal thickening, and trace right pleural effusion. Traction bronchiectasis, at the lower lobes and signs of interstitial lung disease.   04/2021 High resolution CT chest with septal thickening, scattered areas of mild subpleural reticulation and cylindrical bronchiectasis. Mild emphysema, with no frank honeycombing.   Patient will continue with bronchodilator therapy and air way clearing techniques with incentive spirometer.  Continue with inhaled corticosteroids  Diuresis with furosemide (incrase dose to 60 mg daily) and dapagliflozin.   Continue with metoprolol and resume losartan Discontinue amlodipine to prevent hypotension.  Recommended to use supplemental 02 per  continuously.

## 2021-07-13 NOTE — Hospital Course (Addendum)
Mrs. Jessica Hart was admitted to the hospital with the working diagnosis of decompensated heart failure.   81 yo female with the past medical history of pulmonary hypertension, T2DM, dyslipidemia, chronic hypoxemic respiratory failure who presented with lower extremity edema. Reported 3 weeks of worsening symptoms, refractive to medical therapy as outpatient with furosemide. On her initial physical examination blood pressure 115/77, HR 87, RR 20 and 02 saturation 87%, lungs with rales but no wheezing, heart with S1 and S2 present and rhythmic, abdomen not distended and positive lower extremity edema.   Na 142, K 2,6 CL 108, bicarbonate 11, glucose 111 bun 11 cr 1,0 BNP 727  Wbc 9,1 hgb 13,8 plt 237   Chest radiograph with cardiomegaly and bilateral interstitial markings.   EKG 89 bpm, normal axis, normal intervals, sinus rhythm with q wave in lead III and AvF, no significant ST segment changes, negative T in lead I and AvL.  Patient was placed on IV diuresis with furosemide.   Right heart catheterization confirming severe pulmonary hypertension with pulmonary artery pressure of 65/27, with mean of 44 mmHg.   Patient was placed on furosemide for diuresis.  She had worsening hypoxemia and increase 02 requirements, using high flow nasal cannula up to 14 l/min   CT chest with bilateral ground glass opacities, suggesting pulmonary edema. Negative for pulmonary embolism.   Placed on bronchodilator therapy and inhaled corticosteroids.   With further diuresis her oxygenation has improved, at the time of her discharge she is using 5 L/min per regular nasal cannula with 02 saturation 92 to 95%.

## 2021-07-13 NOTE — H&P (View-Only) (Signed)
? ?Progress Note ? ?Patient Name: Jessica Hart ?Date of Encounter: 07/13/2021 ? ?[facility] Cardiologist: Elouise Munroe, MD  ? ?Subjective  ? ?Patient reports that her breathing is overall baseline. Denies any chest pain, orthopnea. Has not yet received IV lasix this admission due to low K  ? ?Inpatient Medications  ?  ?Scheduled Meds: ? aspirin EC  81 mg Oral Daily  ? dapagliflozin propanediol  5 mg Oral Daily  ? heparin  5,000 Units Subcutaneous Q8H  ? insulin aspart  0-15 Units Subcutaneous TID WC  ? insulin aspart  0-5 Units Subcutaneous QHS  ? metoprolol tartrate  37.5 mg Oral BID  ? pantoprazole  40 mg Oral Daily  ? ?Continuous Infusions: ? ?PRN Meds: ?acetaminophen **OR** acetaminophen, ondansetron **OR** ondansetron (ZOFRAN) IV  ? ?Vital Signs  ?  ?Vitals:  ? 07/13/21 0300 07/13/21 0400 07/13/21 0500 07/13/21 0630  ?BP: 128/70 119/77 108/76 114/75  ?Pulse: 81 85 85 80  ?Resp: (!) _0 (!) 21  ?Temp:      ?TempSrc:      ?SpO2: 90% 90% 90% 91%  ?Weight:      ?Height:      ? ?No intake or output data in the 24 hours ending 07/13/21 0801 ? ?  07/12/2021  ?  3:39 PM 07/11/2021  ?  3:07 PM 06/12/2021  ?  9:04 PM  ?Last 3 Weights  ?Weight (lbs) 163 lb 6.4 oz 163 lb 6.4 oz 163 lb  ?Weight (kg) 74.118 kg 74.118 kg 73.936 kg  ?   ? ?Telemetry  ?  ?Sinus rhythm- Personally Reviewed ? ?ECG  ?  ?No new tracings today - Personally Reviewed ? ?Physical Exam  ? ?GEN: No acute distress. Laying flat in the bed wearing nasal cannula with 5L/min supplemental O2  ?Neck: No JVD ?Cardiac: RRR, no murmurs, rubs, or gallops.  ?Respiratory: Fine crackles in bilateral lung bases  ?GI: Soft, nontender, non-distended  ?MS: 2+ edema, no deformities  ?Neuro:  Nonfocal  ?Psych: Normal affect  ? ?Labs  ?  ?High Sensitivity Troponin:   ?Recent Labs  ?Lab 07/12/21 ?1835 07/12/21 ?2134  ?TROPONINIHS 16 14  ?   ?Chemistry ?Recent Labs  ?Lab 07/11/21 ?1535 07/12/21 ?1546 07/13/21 ?0300  ?NA 142 142 139  ?K 3.3* 2.6* 3.2*  ?CL 103  108 110  ?CO2 20 21* 20*  ?GLUCOSE 114* 111* 90  ?BUN _1 ?CREATININE 0.85 1.06* 0.86  ?CALCIUM 9.2 8.9 8.8*  ?MG  --   --  1.9  ?PROT  --   --  6.9  ?ALBUMIN  --   --  3.5  ?AST  --   --  28  ?ALT  --   --  21  ?ALKPHOS  --   --  102  ?BILITOT  --   --  1.4*  ?GFRNONAA  --  53* >60  ?ANIONGAP  --  13 9  ?  ?Lipids No results for input(s): CHOL, TRIG, HDL, LABVLDL, LDLCALC, CHOLHDL in the last 168 hours.  ?Hematology ?Recent Labs  ?Lab 07/12/21 ?1546 07/13/21 ?0300  ?WBC 9.1 8.5  ?RBC 4.97 4.80  ?HGB 13.8 13.4  ?HCT 42.5 41.0  ?MCV 85.5 85.4  ?MCH 27.8 27.9  ?MCHC 32.5 32.7  ?RDW 16.7* 16.6*  ?PLT 237 239  ? ?Thyroid No results for input(s): TSH, FREET4 in the last 168 hours.  ?BNP ?Recent Labs  ?Lab 07/11/21 ?1535 07/12/21 ?1546  ?BNP 658.5* 727.3*  ?  ?  DDimer No results for input(s): DDIMER in the last 168 hours.  ? ?Radiology  ?  ?DG Chest 1 View ? ?Result Date: 07/12/2021 ?CLINICAL DATA:  Shortness of breath with swelling. EXAM: CHEST  1 VIEW COMPARISON:  02/08/2021. FINDINGS: Chronic interstitial opacities. No confluent consolidation. No visible pleural effusions or pneumothorax. Enlarged cardiac silhouette. Median sternotomy and CABG. Polyarticular degenerative change. IMPRESSION: 1. Chronic interstitial opacities, probably in part due to the interstitial lung disease seen on prior CT chest. Superimposed mild interstitial edema is not excluded. 2. Cardiomegaly. Electronically Signed   By: Margaretha Sheffield M.D.   On: 07/12/2021 16:33   ? ?Cardiac Studies  ? ?Echocardiogram 03/08/21 ? 1. Left ventricular ejection fraction, by estimation, is 65 to 70%. The  ?left ventricle has normal function. The left ventricle has no regional  ?wall motion abnormalities. There is moderate left ventricular hypertrophy.  ?Left ventricular diastolic  ?parameters are consistent with Grade I diastolic dysfunction (impaired  ?relaxation). Elevated left atrial pressure. There is the interventricular  ?septum is flattened in  systole and diastole, consistent with right  ?ventricular pressure and volume overload.  ?The average left ventricular global longitudinal strain is -18.2 %. The  ?global longitudinal strain is normal.  ? 2. Right ventricular systolic function is mildly reduced. The right  ?ventricular size is moderately enlarged. There is severely elevated  ?pulmonary artery systolic pressure. The estimated right ventricular  ?systolic pressure is 70.1 mmHg.  ? 3. Right atrial size was mildly dilated.  ? 4. The mitral valve is normal in structure. Trivial mitral valve  ?regurgitation. No evidence of mitral stenosis. Moderate mitral annular  ?calcification.  ? 5. Tricuspid valve regurgitation is moderate.  ? 6. The aortic valve is calcified. There is moderate calcification of the  ?aortic valve. There is severe thickening of the aortic valve. Aortic valve  ?regurgitation is not visualized. Aortic valve sclerosis/calcification is  ?present, without any evidence of  ? aortic stenosis.  ? 7. The inferior vena cava is normal in size with greater than 50%  ?respiratory variability, suggesting right atrial pressure of 3 mmHg.  ? ?Patient Profile  ?   ?81 y.o. female with a hx of DM2, HTN, HL, CAD with prior CABG, chronic resp failure on 4l [place], pulm HTN who was seen 07/12/2021 for the evaluation of SOB at the request of Dr Bridgett Larsson. ? ?Assessment & Plan  ?  ?Acute on chronic RV failure ?Diastolic HF ?Pulm HTN ?- Jan 2023 echo: LVEF 65-70%, grade I dd with elevated LA pressure, Dshaped septum, mild RV dysfunction and mod RVE, severe pulm HTN PASP 67, mod TR.  ?- Patient is followed by Dr. Margaretann Loveless and Dr. Silas Flood- per review of their notes, suspect that her RV failure/pulmonary HTN/chronic SOB is driven by emphysema (group 3 PHTN), but there may be a component of left heart failure given grade I diastolic dysfunction (group 2 PHTN)  ?- BNP elevated to 727, CXR showed chronic interstitial opacieties, probably in part due to interstitial lung  disease seen on prior CT chest. Could also be mild interstitial edema  ?- Plan to do a RHC while patient is admitted. Patient is able to lay flat without dyspnea, could potentially do today or tomorrow-- will confirm with MD (patient already NPO) ?- Needs outpatient sleep study  ?- ARB held overnight due to elevated creatinine (1.06) on admission and to protect kidneys prior to possible RHC ?- Has not received IV lasix yet as hypokalemic. Has received 120 mEq K so  far, K 3.2 this AM.  ?- Ordered an additional 60 mEq K this AM, followed by IV lasix 40 mg  ?- Strict I/Os, daily weights  ? ?Chronic respiratory failure with hypoxia  ?- On home O2 since February 2023, on 4L/min  ?- Followed by pulmonary, suspect possible ILD vs PH  ? ?Otherwise per primary  ?- GERD ?- Type 2 DM  ? ? ?  ?For questions or updates, please contact [facility] ?Please consult [email or web address] for contact info under  ? ?  ?   ?Signed, ?Margie Billet, PA-C  ?07/13/2021, 8:01 AM   ? ?History and all data above reviewed.  Patient examined.  I agree with the findings as above.    She presented with lower extremity edema.  Breathing likely baseline.  She tries to limit salt.  Does not weight daily.  The patient exam reveals COR:RRR  ,  Lungs: Decreased breath sounds at the bases  ,  Abd: Positive bowel sounds, no rebound no guarding, Ext Mild edema, Neck:  JVD to jaw  .  All available labs, radiology testing, previous records reviewed. Agree with documented assessment and plan.   Agree with plans for diuresis today.   Will plan right heart cath while she is here.   I doubt that we are going to need to adjust the diuretic much from her home dose at discharge.  Needs continued education and about salt and fluid.  Needs likely increased potassium dose at discharge.   Jeneen Rinks Dishawn Bhargava  11:37 AM  07/13/2021 ? ?

## 2021-07-13 NOTE — Progress Notes (Addendum)
? ?Progress Note ? ?Patient Name: Jessica Hart ?Date of Encounter: 07/13/2021 ? ?[facility] Cardiologist: Gayatri A Acharya, MD  ? ?Subjective  ? ?Patient reports that her breathing is overall baseline. Denies any chest pain, orthopnea. Has not yet received IV lasix this admission due to low K  ? ?Inpatient Medications  ?  ?Scheduled Meds: ? aspirin EC  81 mg Oral Daily  ? dapagliflozin propanediol  5 mg Oral Daily  ? heparin  5,000 Units Subcutaneous Q8H  ? insulin aspart  0-15 Units Subcutaneous TID WC  ? insulin aspart  0-5 Units Subcutaneous QHS  ? metoprolol tartrate  37.5 mg Oral BID  ? pantoprazole  40 mg Oral Daily  ? ?Continuous Infusions: ? ?PRN Meds: ?acetaminophen **OR** acetaminophen, ondansetron **OR** ondansetron (ZOFRAN) IV  ? ?Vital Signs  ?  ?Vitals:  ? 07/13/21 0300 07/13/21 0400 07/13/21 0500 07/13/21 0630  ?BP: 128/70 119/77 108/76 114/75  ?Pulse: 81 85 85 80  ?Resp: (!) 22 16 17 (!) 21  ?Temp:      ?TempSrc:      ?SpO2: 90% 90% 90% 91%  ?Weight:      ?Height:      ? ?No intake or output data in the 24 hours ending 07/13/21 0801 ? ?  07/12/2021  ?  3:39 PM 07/11/2021  ?  3:07 PM 06/12/2021  ?  9:04 PM  ?Last 3 Weights  ?Weight (lbs) 163 lb 6.4 oz 163 lb 6.4 oz 163 lb  ?Weight (kg) 74.118 kg 74.118 kg 73.936 kg  ?   ? ?Telemetry  ?  ?Sinus rhythm- Personally Reviewed ? ?ECG  ?  ?No new tracings today - Personally Reviewed ? ?Physical Exam  ? ?GEN: No acute distress. Laying flat in the bed wearing nasal cannula with 5L/min supplemental O2  ?Neck: No JVD ?Cardiac: RRR, no murmurs, rubs, or gallops.  ?Respiratory: Fine crackles in bilateral lung bases  ?GI: Soft, nontender, non-distended  ?MS: 2+ edema, no deformities  ?Neuro:  Nonfocal  ?Psych: Normal affect  ? ?Labs  ?  ?High Sensitivity Troponin:   ?Recent Labs  ?Lab 07/12/21 ?1835 07/12/21 ?2134  ?TROPONINIHS 16 14  ?   ?Chemistry ?Recent Labs  ?Lab 07/11/21 ?1535 07/12/21 ?1546 07/13/21 ?0300  ?NA 142 142 139  ?K 3.3* 2.6* 3.2*  ?CL 103  108 110  ?CO2 20 21* 20*  ?GLUCOSE 114* 111* 90  ?BUN 11 11 10  ?CREATININE 0.85 1.06* 0.86  ?CALCIUM 9.2 8.9 8.8*  ?MG  --   --  1.9  ?PROT  --   --  6.9  ?ALBUMIN  --   --  3.5  ?AST  --   --  28  ?ALT  --   --  21  ?ALKPHOS  --   --  102  ?BILITOT  --   --  1.4*  ?GFRNONAA  --  53* >60  ?ANIONGAP  --  13 9  ?  ?Lipids No results for input(s): CHOL, TRIG, HDL, LABVLDL, LDLCALC, CHOLHDL in the last 168 hours.  ?Hematology ?Recent Labs  ?Lab 07/12/21 ?1546 07/13/21 ?0300  ?WBC 9.1 8.5  ?RBC 4.97 4.80  ?HGB 13.8 13.4  ?HCT 42.5 41.0  ?MCV 85.5 85.4  ?MCH 27.8 27.9  ?MCHC 32.5 32.7  ?RDW 16.7* 16.6*  ?PLT 237 239  ? ?Thyroid No results for input(s): TSH, FREET4 in the last 168 hours.  ?BNP ?Recent Labs  ?Lab 07/11/21 ?1535 07/12/21 ?1546  ?BNP 658.5* 727.3*  ?  ?  DDimer No results for input(s): DDIMER in the last 168 hours.  ? ?Radiology  ?  ?DG Chest 1 View ? ?Result Date: 07/12/2021 ?CLINICAL DATA:  Shortness of breath with swelling. EXAM: CHEST  1 VIEW COMPARISON:  02/08/2021. FINDINGS: Chronic interstitial opacities. No confluent consolidation. No visible pleural effusions or pneumothorax. Enlarged cardiac silhouette. Median sternotomy and CABG. Polyarticular degenerative change. IMPRESSION: 1. Chronic interstitial opacities, probably in part due to the interstitial lung disease seen on prior CT chest. Superimposed mild interstitial edema is not excluded. 2. Cardiomegaly. Electronically Signed   By: Frederick S Jones M.D.   On: 07/12/2021 16:33   ? ?Cardiac Studies  ? ?Echocardiogram 03/08/21 ? 1. Left ventricular ejection fraction, by estimation, is 65 to 70%. The  ?left ventricle has normal function. The left ventricle has no regional  ?wall motion abnormalities. There is moderate left ventricular hypertrophy.  ?Left ventricular diastolic  ?parameters are consistent with Grade I diastolic dysfunction (impaired  ?relaxation). Elevated left atrial pressure. There is the interventricular  ?septum is flattened in  systole and diastole, consistent with right  ?ventricular pressure and volume overload.  ?The average left ventricular global longitudinal strain is -18.2 %. The  ?global longitudinal strain is normal.  ? 2. Right ventricular systolic function is mildly reduced. The right  ?ventricular size is moderately enlarged. There is severely elevated  ?pulmonary artery systolic pressure. The estimated right ventricular  ?systolic pressure is 66.7 mmHg.  ? 3. Right atrial size was mildly dilated.  ? 4. The mitral valve is normal in structure. Trivial mitral valve  ?regurgitation. No evidence of mitral stenosis. Moderate mitral annular  ?calcification.  ? 5. Tricuspid valve regurgitation is moderate.  ? 6. The aortic valve is calcified. There is moderate calcification of the  ?aortic valve. There is severe thickening of the aortic valve. Aortic valve  ?regurgitation is not visualized. Aortic valve sclerosis/calcification is  ?present, without any evidence of  ? aortic stenosis.  ? 7. The inferior vena cava is normal in size with greater than 50%  ?respiratory variability, suggesting right atrial pressure of 3 mmHg.  ? ?Patient Profile  ?   ?81 y.o. female with a hx of DM2, HTN, HL, CAD with prior CABG, chronic resp failure on 4l [place], pulm HTN who was seen 07/12/2021 for the evaluation of SOB at the request of Dr CHen. ? ?Assessment & Plan  ?  ?Acute on chronic RV failure ?Diastolic HF ?Pulm HTN ?- Jan 2023 echo: LVEF 65-70%, grade I dd with elevated LA pressure, Dshaped septum, mild RV dysfunction and mod RVE, severe pulm HTN PASP 67, mod TR.  ?- Patient is followed by Dr. Acharya and Dr. 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 Could also be mild interstitial edema  ?- Plan to do a RHC while patient is admitted. Patient is able to lay flat without dyspnea, could potentially do today or tomorrow-- will confirm with MD (patient already NPO) ?- Needs outpatient sleep study  ?- ARB held overnight due to elevated creatinine (1.06) on admission and to protect kidneys prior to possible RHC ?- Has not received IV lasix yet as hypokalemic. Has received 120 mEq K so   far, K 3.2 this AM.  ?- Ordered an additional 60 mEq K this AM, followed by IV lasix 40 mg  ?- Strict I/Os, daily weights  ? ?Chronic respiratory failure with hypoxia  ?- On home O2 since February 2023, on 4L/min  ?- Followed by pulmonary, suspect possible ILD vs PH  ? ?Otherwise per primary  ?- GERD ?- Type 2 DM  ? ? ?  ?For questions or updates, please contact [facility] ?Please consult [email or web address] for contact info under  ? ?  ?   ?Signed, ?Kathleen R. Johnson, PA-C  ?07/13/2021, 8:01 AM   ? ?History and all data above reviewed.  Patient examined.  I agree with the findings as above.    She presented with lower extremity edema.  Breathing likely baseline.  She tries to limit salt.  Does not weight daily.  The patient exam reveals COR:RRR  ,  Lungs: Decreased breath sounds at the bases  ,  Abd: Positive bowel sounds, no rebound no guarding, Ext Mild edema, Neck:  JVD to jaw  .  All available labs, radiology testing, previous records reviewed. Agree with documented assessment and plan.   Agree with plans for diuresis today.   Will plan right heart cath while she is here.   I doubt that we are going to need to adjust the diuretic much from her home dose at discharge.  Needs continued education and about salt and fluid.  Needs likely increased potassium dose at discharge.   Debbera Wolken  11:37 AM  07/13/2021 ? ?

## 2021-07-14 ENCOUNTER — Observation Stay (HOSPITAL_COMMUNITY): Payer: No Typology Code available for payment source

## 2021-07-14 ENCOUNTER — Encounter (HOSPITAL_COMMUNITY): Payer: Self-pay | Admitting: Internal Medicine

## 2021-07-14 ENCOUNTER — Inpatient Hospital Stay (HOSPITAL_COMMUNITY): Payer: No Typology Code available for payment source

## 2021-07-14 DIAGNOSIS — Z79899 Other long term (current) drug therapy: Secondary | ICD-10-CM | POA: Diagnosis not present

## 2021-07-14 DIAGNOSIS — N179 Acute kidney failure, unspecified: Secondary | ICD-10-CM | POA: Diagnosis not present

## 2021-07-14 DIAGNOSIS — Z87891 Personal history of nicotine dependence: Secondary | ICD-10-CM | POA: Diagnosis not present

## 2021-07-14 DIAGNOSIS — E785 Hyperlipidemia, unspecified: Secondary | ICD-10-CM | POA: Diagnosis not present

## 2021-07-14 DIAGNOSIS — E1169 Type 2 diabetes mellitus with other specified complication: Secondary | ICD-10-CM | POA: Diagnosis not present

## 2021-07-14 DIAGNOSIS — Z90710 Acquired absence of both cervix and uterus: Secondary | ICD-10-CM | POA: Diagnosis not present

## 2021-07-14 DIAGNOSIS — I2510 Atherosclerotic heart disease of native coronary artery without angina pectoris: Secondary | ICD-10-CM | POA: Diagnosis not present

## 2021-07-14 DIAGNOSIS — M79602 Pain in left arm: Secondary | ICD-10-CM

## 2021-07-14 DIAGNOSIS — Z794 Long term (current) use of insulin: Secondary | ICD-10-CM | POA: Diagnosis not present

## 2021-07-14 DIAGNOSIS — M1990 Unspecified osteoarthritis, unspecified site: Secondary | ICD-10-CM | POA: Diagnosis not present

## 2021-07-14 DIAGNOSIS — E781 Pure hyperglyceridemia: Secondary | ICD-10-CM | POA: Diagnosis not present

## 2021-07-14 DIAGNOSIS — Z7984 Long term (current) use of oral hypoglycemic drugs: Secondary | ICD-10-CM | POA: Diagnosis not present

## 2021-07-14 DIAGNOSIS — Z951 Presence of aortocoronary bypass graft: Secondary | ICD-10-CM | POA: Diagnosis not present

## 2021-07-14 DIAGNOSIS — K5790 Diverticulosis of intestine, part unspecified, without perforation or abscess without bleeding: Secondary | ICD-10-CM | POA: Diagnosis not present

## 2021-07-14 DIAGNOSIS — E876 Hypokalemia: Secondary | ICD-10-CM

## 2021-07-14 DIAGNOSIS — J9690 Respiratory failure, unspecified, unspecified whether with hypoxia or hypercapnia: Secondary | ICD-10-CM | POA: Diagnosis not present

## 2021-07-14 DIAGNOSIS — I2721 Secondary pulmonary arterial hypertension: Secondary | ICD-10-CM | POA: Diagnosis not present

## 2021-07-14 DIAGNOSIS — J439 Emphysema, unspecified: Secondary | ICD-10-CM | POA: Diagnosis not present

## 2021-07-14 DIAGNOSIS — M7989 Other specified soft tissue disorders: Secondary | ICD-10-CM

## 2021-07-14 DIAGNOSIS — I2723 Pulmonary hypertension due to lung diseases and hypoxia: Secondary | ICD-10-CM | POA: Diagnosis not present

## 2021-07-14 DIAGNOSIS — Z7982 Long term (current) use of aspirin: Secondary | ICD-10-CM | POA: Diagnosis not present

## 2021-07-14 DIAGNOSIS — M542 Cervicalgia: Secondary | ICD-10-CM | POA: Diagnosis not present

## 2021-07-14 DIAGNOSIS — R188 Other ascites: Secondary | ICD-10-CM | POA: Diagnosis not present

## 2021-07-14 DIAGNOSIS — K219 Gastro-esophageal reflux disease without esophagitis: Secondary | ICD-10-CM | POA: Diagnosis not present

## 2021-07-14 DIAGNOSIS — I110 Hypertensive heart disease with heart failure: Secondary | ICD-10-CM | POA: Diagnosis not present

## 2021-07-14 DIAGNOSIS — J9621 Acute and chronic respiratory failure with hypoxia: Secondary | ICD-10-CM | POA: Diagnosis not present

## 2021-07-14 DIAGNOSIS — Z9981 Dependence on supplemental oxygen: Secondary | ICD-10-CM | POA: Diagnosis not present

## 2021-07-14 DIAGNOSIS — I5033 Acute on chronic diastolic (congestive) heart failure: Secondary | ICD-10-CM | POA: Diagnosis not present

## 2021-07-14 DIAGNOSIS — K449 Diaphragmatic hernia without obstruction or gangrene: Secondary | ICD-10-CM | POA: Diagnosis not present

## 2021-07-14 LAB — BASIC METABOLIC PANEL
Anion gap: 6 (ref 5–15)
BUN: 17 mg/dL (ref 8–23)
CO2: 22 mmol/L (ref 22–32)
Calcium: 8.8 mg/dL — ABNORMAL LOW (ref 8.9–10.3)
Chloride: 113 mmol/L — ABNORMAL HIGH (ref 98–111)
Creatinine, Ser: 1.14 mg/dL — ABNORMAL HIGH (ref 0.44–1.00)
GFR, Estimated: 49 mL/min — ABNORMAL LOW (ref >60)
Glucose, Bld: 92 mg/dL (ref 70–99)
Potassium: 4.3 mmol/L (ref 3.5–5.1)
Sodium: 141 mmol/L (ref 135–145)

## 2021-07-14 LAB — POCT I-STAT EG7
Acid-base deficit: 3 mmol/L — ABNORMAL HIGH (ref 0.0–2.0)
Bicarbonate: 22.5 mmol/L (ref 20.0–28.0)
Calcium, Ion: 1.21 mmol/L (ref 1.15–1.40)
HCT: 42 % (ref 36.0–46.0)
Hemoglobin: 14.3 g/dL (ref 12.0–15.0)
O2 Saturation: 61 %
Potassium: 4.9 mmol/L (ref 3.5–5.1)
Sodium: 144 mmol/L (ref 135–145)
TCO2: 24 mmol/L (ref 22–32)
pCO2, Ven: 39.7 mmHg — ABNORMAL LOW (ref 44–60)
pH, Ven: 7.361 (ref 7.25–7.43)
pO2, Ven: 33 mmHg (ref 32–45)

## 2021-07-14 LAB — GLUCOSE, CAPILLARY
Glucose-Capillary: 152 mg/dL — ABNORMAL HIGH (ref 70–99)
Glucose-Capillary: 150 mg/dL — ABNORMAL HIGH (ref 70–99)
Glucose-Capillary: 177 mg/dL — ABNORMAL HIGH (ref 70–99)
Glucose-Capillary: 94 mg/dL (ref 70–99)

## 2021-07-14 MED ORDER — IPRATROPIUM-ALBUTEROL 0.5-2.5 (3) MG/3ML IN SOLN
3.0000 mL | Freq: Four times a day (QID) | RESPIRATORY_TRACT | Status: DC
  Administered 2021-07-14 – 2021-07-16 (×8): 3 mL via RESPIRATORY_TRACT
  Filled 2021-07-14 (×8): qty 3

## 2021-07-14 MED ORDER — IOHEXOL 350 MG/ML SOLN
80.0000 mL | Freq: Once | INTRAVENOUS | Status: AC | PRN
  Administered 2021-07-14: 80 mL via INTRAVENOUS

## 2021-07-14 MED ORDER — ASPIRIN 81 MG PO TBEC
81.0000 mg | DELAYED_RELEASE_TABLET | Freq: Every day | ORAL | Status: DC
  Administered 2021-07-15 – 2021-07-17 (×3): 81 mg via ORAL
  Filled 2021-07-14 (×3): qty 1

## 2021-07-14 MED ORDER — IPRATROPIUM-ALBUTEROL 0.5-2.5 (3) MG/3ML IN SOLN: 3.0000 mL | RESPIRATORY_TRACT | Status: DC | PRN

## 2021-07-14 NOTE — Progress Notes (Signed)
Progress Note   Patient: Jessica Hart SHU:[id number] DOB: [birth year] DOA: 07/12/2021     0 DOS: the patient was seen and examined on 07/14/2021   Brief hospital course: [name] was admitted to the hospital with the working diagnosis of decompensated heart failure.   81 yo female with the past medical history of pulmonary hypertension, T2DM, dyslipidemia, chronic hypoxemic respiratory failure who presented with lower extremity edema. Reported 3 weeks of worsening symptoms, refractive to medical therapy as outpatient with furosemide. On her initial physical examination blood pressure 115/77, HR 87, RR 20 and 02 saturation 87%, lungs with rales but no wheezing, heart with S1 and S2 present and rhythmic, abdomen not distended and positive lower extremity edema.   Na 142, K 2,6 CL 108, bicarbonate 11, glucose 111 bun 11 cr 1,0 BNP 727  Wbc 9,1 hgb 13,8 plt 237   Chest radiograph with cardiomegaly and bilateral interstitial markings.   EKG 89 bpm, normal axis, normal intervals, sinus rhythm with q wave in lead III and AvF, no significant ST segment changes, negative T in lead I and AvL.  Patient was placed on IV diuresis with furosemide.   Right heart catheterization confirming sever pulmonary hypertension with pulmonary artery pressure of 65/27, with mean of 44 mmHg.   Patient continue to have high oxygen requirements despite diuresis with IV furosemide. On high flow nasal cannula 9 L per min with 02 saturation 90%      Assessment and Plan: * Pulmonary artery hypertension (HCC) Acute on chronic diastolic heart failure.  Acute on chronic hypoxemic respiratory failure.   Urine output is 1,400 ml.  Systolic blood pressure  155 to 131 mmHg.   Cardiac catheterization with right atrial pressure 23/22 with mean 17, RV 67/21. PA 65/27, mean 44, LV end diastolic pressure 4 (surrogate for PCWP that was not able to obtain) and SVC 02 saturation 61%.  Pulmonary resistance is 10 woods.    Continue supplemental 02 per HFNC currently using 8 to 9 liters.  Holding diuresis for now.  On dapagliflozin and metoprolol.   High resolution CT chest with septal thickening, scattered areas of mild subpleural reticulation and cylindrical bronchiectasis. Mild emphysema, with no frank honeycombing.   Will add bronchodilator therapy and air way clearing techniques with flutter valve and incentive spirometer.   Further work up with Chest CT with contrast to rule out pulmonary embolism.        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   GERD (gastroesophageal reflux disease) Continue with antiacid therapy.   Hypokalemia Hypomagnesemia.   Renal function stable with serum cr at 1,14 with K at 4,3 and serum bicarbonate at 22. Plan to hold on diuretic therapy for now and continue close follow up on renal function and electrolytes.         Subjective: Patient continue to have sever dyspnea, worse with movement, her edema has improved, continue with high oxygen requirements.   Physical Exam: Vitals:   07/14/21 0026 07/14/21 0512 07/14/21 0752 07/14/21 1115  BP: 140/83 125/78 113/60 106/68  Pulse: 91 78 82 71  Resp: _0 Temp: 98.3 F (36.8 C) 97.9 F (36.6 C) 97.7 F (36.5 C) 98.1 F (36.7 C)  TempSrc: Oral Oral Oral Oral  SpO2: 90% (!) 88% 90% 90%  Weight:      Height:  Neurology awake and alert ENT with no pallor Cardiovascular with S1 and S2 present with no gallops, positive systolic murmur at the right sternal border Positive JVD No lower extremity edema Respiratory with no wheezing or rales Abdomen not distended Data Reviewed:    Family Communication: no family at the bedside   Disposition: Status is: Inpatient Remains inpatient appropriate because: high oxygen requirements high flow nasal cannula  Planned Discharge  Destination: Home  Author: Tawni Millers, MD 07/14/2021 4:38 PM  For on call review [email or web address].

## 2021-07-14 NOTE — Progress Notes (Addendum)
Progress Note  Patient Name: Jessica Hart Date of Encounter: 07/14/2021  [facility] Cardiologist: Elouise Munroe, MD   Subjective   Patient is doing well this AM, denies any pain around her radial cath site. Does have bilateral neck pain swelling.   Inpatient Medications    Scheduled Meds:  aspirin EC  81 mg Oral Daily   dapagliflozin propanediol  5 mg Oral Daily   heparin  5,000 Units Subcutaneous Q8H   insulin aspart  0-15 Units Subcutaneous TID WC   insulin aspart  0-5 Units Subcutaneous QHS   metoprolol tartrate  37.5 mg Oral BID   pantoprazole  40 mg Oral Daily   sodium chloride flush  3 mL Intravenous Q12H   Continuous Infusions:  PRN Meds: acetaminophen **OR** acetaminophen, ondansetron **OR** ondansetron (ZOFRAN) IV   Vital Signs    Vitals:   07/13/21 1954 07/14/21 0026 07/14/21 0512 07/14/21 0752  BP:  140/83 125/78 113/60  Pulse:  91 78 82  Resp:  _0 Temp: 98.1 F (36.7 C) 98.3 F (36.8 C) 97.9 F (36.6 C) 97.7 F (36.5 C)  TempSrc: Oral Oral Oral Oral  SpO2:  90% (!) 88% 90%  Weight:      Height:        Intake/Output Summary (Last 24 hours) at 07/14/2021 0848 Last data filed at 07/14/2021 0800 Gross per 24 hour  Intake 600 ml  Output 1400 ml  Net -800 ml      07/12/2021    3:39 PM 07/11/2021    3:07 PM 06/12/2021    9:04 PM  Last 3 Weights  Weight (lbs) 163 lb 6.4 oz 163 lb 6.4 oz 163 lb  Weight (kg) 74.118 kg 74.118 kg 73.936 kg      Telemetry    Sinus rhythm - Personally Reviewed  ECG    No new tracings since 5/17 - Personally Reviewed  Physical Exam   GEN: No acute distress. Sitting comfortably in the bed Neck: No JVD. Right neck has an area of redness, swelling, hardness, tenderness at the base of neck. Left neck painful, no swelling or hardness noted, covered in bandage.  Cardiac: RRR, no murmurs, rubs, or gallops.  Respiratory: Clear to auscultation bilaterally. Diminished breath sounds at lung bases GI:  Soft, nontender, non-distended  MS: No edema; No deformity. Neuro:  Nonfocal  Psych: Normal affect   Labs    High Sensitivity Troponin:   Recent Labs  Lab 07/12/21 1835 07/12/21 2134  TROPONINIHS 16 14     Chemistry Recent Labs  Lab 07/12/21 1546 07/13/21 0300 07/14/21 0302  NA 142 139 141  K 2.6* 3.2* 4.3  CL 108 110 113*  CO2 21* 20* 22  GLUCOSE 111* 90 92  BUN _1 CREATININE 1.06* 0.86 1.14*  CALCIUM 8.9 8.8* 8.8*  MG  --  1.9  --   PROT  --  6.9  --   ALBUMIN  --  3.5  --   AST  --  28  --   ALT  --  21  --   ALKPHOS  --  102  --   BILITOT  --  1.4*  --   GFRNONAA 53* >60 49*  ANIONGAP _2 Lipids No results for input(s): CHOL, TRIG, HDL, LABVLDL, LDLCALC, CHOLHDL in the last 168 hours.  Hematology Recent Labs  Lab 07/12/21 1546 07/13/21 0300  WBC 9.1 8.5  RBC 4.97 4.80  HGB  13.8 13.4  HCT 42.5 41.0  MCV 85.5 85.4  MCH 27.8 27.9  MCHC 32.5 32.7  RDW 16.7* 16.6*  PLT 237 239   Thyroid No results for input(s): TSH, FREET4 in the last 168 hours.  BNP Recent Labs  Lab 07/11/21 1535 07/12/21 1546  BNP 658.5* 727.3*    DDimer No results for input(s): DDIMER in the last 168 hours.   Radiology    DG Chest 1 View  Result Date: 07/12/2021 CLINICAL DATA:  Shortness of breath with swelling. EXAM: CHEST  1 VIEW COMPARISON:  02/08/2021. FINDINGS: Chronic interstitial opacities. No confluent consolidation. No visible pleural effusions or pneumothorax. Enlarged cardiac silhouette. Median sternotomy and CABG. Polyarticular degenerative change. IMPRESSION: 1. Chronic interstitial opacities, probably in part due to the interstitial lung disease seen on prior CT chest. Superimposed mild interstitial edema is not excluded. 2. Cardiomegaly. Electronically Signed   By: Margaretha Sheffield M.D.   On: 07/12/2021 16:33   CARDIAC CATHETERIZATION  Result Date: 07/13/2021 RHC: Right atrial pressure 23/22 mmHg ith a mean of 17 mmHg Right ventricular pressure of  67/-2 mmHg with a right ventricular end-diastolic pressure of 21 mmHg Pulmonary artery pressure of 65/27 mmHg with a mean pulmonary artery pressure 44 mmHg Left ventricular end-diastolic pressure of 4 mmHg SVC oxygen saturation of 61% Pulmonary artery oxygen saturation 59% Arterial oxygen saturation of 92% Fick cardiac output of 4.18 L/min Fick cardiac index of 2.35 L/min If the left ventricular end-diastolic pressure is taken as surrogate for the the left atrial pressure the pulmonary vascular resistance is approximately 10 Woods units (see technique section for further details as wedge pressure could not be obtained). Conclusion:  Right heart catheterization with pulmonary arterial hypertension with a mean pulmonary artery pressure of 44 mmHg and a pulmonary vascular resistance of approximately 10 Wood units.   Cardiac Studies   Right Heart Cath 07/13/21  RHC: Right atrial pressure 23/22 mmHg ith a mean of 17 mmHg Right ventricular pressure of 67/-2 mmHg with a right ventricular end-diastolic pressure of 21 mmHg Pulmonary artery pressure of 65/27 mmHg with a mean pulmonary artery pressure 44 mmHg  Left ventricular end-diastolic pressure of 4 mmHg SVC oxygen saturation of 61%   Pulmonary artery oxygen saturation 59% Arterial oxygen saturation of 92%   Fick cardiac output of 4.18 L/min Fick cardiac index of 2.35 L/min   If the left ventricular end-diastolic pressure is taken as surrogate for the the left atrial pressure the pulmonary vascular resistance is approximately 10 Woods units (see technique section for further details as wedge pressure could not be obtained).   Conclusion:  Right heart catheterization with pulmonary arterial hypertension with a mean pulmonary artery pressure of 44 mmHg and a pulmonary vascular resistance of approximately 10 Wood units.    Patient Profile     81 y.o. female with a hx of DM2, HTN, HL, CAD with prior CABG, chronic resp failure on 4l [place], pulm HTN who was  seen 07/12/2021 for the evaluation of SOB at the request of Dr Bridgett Larsson.  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  - Patient is followed by Dr. Margaretann Loveless and Dr. Silas Flood- per review of their notes, suspect that her RV failure/pulmonary HTN/chronic SOB is driven by emphysema (group 3 PHTN), but there may be a  component of left heart failure given grade I diastolic dysfunction (group 2 PHTN)  - BNP elevated to 727, CXR showed chronic interstitial opacieties, probably in part due to interstitial lung disease seen on prior CT chest. Could also be mild interstitial edema  - Underwent RHC yesterday with above findings. Likely that her pulmonary HTN is due to lung disease rather left heart failure  - Needs outpatient sleep study  - ARB held overnight due to elevated creatinine (1.06) on admission and to protect kidneys prior to possible RHC-- continue to hold for now as creatinine slightly up after cath yesterday  - K improved to 4.3 after supplementation  - Patient received lasix 40 mg IV x1 yesterday-- Output 1.4 L urine yesterday - Creatinine up to 1.14 today, hold lasix for now. Transition back to home lasix PO 40 mg daily tomorrow. I do not think this dose needs to be adjusted at discharge  - However, will likely need increased dose of K supplementation at discharge - Strict I/Os, daily weights   Neck Pain  -Patient has an area of hardness, swelling, redness, and tenderness at the base of her neck near her right internal jugular. Left internal jugular tender, less swollen and soft  - Ordered ultrasound    Chronic respiratory failure with hypoxia  - On home O2 since February 2023, on 4L/min  - Followed by pulmonary, suspect possible ILD vs PH    Otherwise per primary  - GERD - Type 2 DM      For questions or  updates, please contact [facility] Please consult [email or web address] for contact info under        Signed, Margie Billet, PA-C  07/14/2021, 8:48 AM    History and all data above reviewed.  Patient examined.  I agree with the findings as above.  Results of right heart cath and left heart cath for pressures only noted.  EDP was low.  Neck pain as stated above with possible thrombosed pseudoaneurysm on the right.  Denies SOB and the pain is improved.   The patient exam reveals COR:RRR  ,  Lungs: Clear  ,  Abd: Positive bowel sounds, no rebound no guarding, Ext No edema,  Neck  :  Swelling not pulsatile and no bruit  .  All available labs, radiology testing, previous records reviewed. Agree with documented assessment and plan.   Pulmonary HTN:  Noted as above but without elevated EDP.  I would not further diurese.  She likely does need her home PO dose of diuretic at discharge but management of the lower extremity edema should be conservative with elevation of her feet and compression stockings for the most part.  She and I had this discussion.  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   No further imaging but this can be followed clinically and if she has persistent pain or swelling she could get further imaging.    Jeneen Rinks Ericha Whittingham  12:39 PM  07/14/2021

## 2021-07-14 NOTE — Care Management Obs Status (Signed)
[place] NOTIFICATION   Patient Details  Name: Jessica Hart MRN: [id number] Date of Birth: [birth year]   Medicare Observation Status Notification Given:  Yes    Verdell Carmine, RN 07/14/2021, 10:29 AM

## 2021-07-14 NOTE — Progress Notes (Signed)
VASCULAR LAB   Ultrasound of bilateral neck/IJVs has been performed.  See CV proc for preliminary results.   Ashlei Chinchilla, RVT 07/14/2021, 1:03 PM

## 2021-07-14 NOTE — Telephone Encounter (Signed)
Pt is currently admitted to the hospital. Does have an upcoming appt scheduled with MH in June. If pt has not been able to get a pulse ox prior to that OV, this can be discussed during the OV.

## 2021-07-14 NOTE — Progress Notes (Signed)
Heart Failure Navigator Progress Note  Assessed for Heart & Vascular TOC clinic readiness.  Patient does not meet criteria due to scheduled with [facility] .     Earnestine Leys, BSN, Clinical cytogeneticist Only

## 2021-07-15 DIAGNOSIS — N179 Acute kidney failure, unspecified: Secondary | ICD-10-CM

## 2021-07-15 DIAGNOSIS — Z951 Presence of aortocoronary bypass graft: Secondary | ICD-10-CM | POA: Diagnosis not present

## 2021-07-15 DIAGNOSIS — I2721 Secondary pulmonary arterial hypertension: Secondary | ICD-10-CM | POA: Diagnosis not present

## 2021-07-15 DIAGNOSIS — E1169 Type 2 diabetes mellitus with other specified complication: Secondary | ICD-10-CM | POA: Diagnosis not present

## 2021-07-15 DIAGNOSIS — K219 Gastro-esophageal reflux disease without esophagitis: Secondary | ICD-10-CM | POA: Diagnosis not present

## 2021-07-15 LAB — BASIC METABOLIC PANEL
Anion gap: 8 (ref 5–15)
BUN: 17 mg/dL (ref 8–23)
CO2: 21 mmol/L — ABNORMAL LOW (ref 22–32)
Calcium: 8.9 mg/dL (ref 8.9–10.3)
Chloride: 112 mmol/L — ABNORMAL HIGH (ref 98–111)
Creatinine, Ser: 1.03 mg/dL — ABNORMAL HIGH (ref 0.44–1.00)
GFR, Estimated: 55 mL/min — ABNORMAL LOW (ref >60)
Glucose, Bld: 85 mg/dL (ref 70–99)
Potassium: 3.6 mmol/L (ref 3.5–5.1)
Sodium: 141 mmol/L (ref 135–145)

## 2021-07-15 LAB — GLUCOSE, CAPILLARY
Glucose-Capillary: 78 mg/dL (ref 70–99)
Glucose-Capillary: 166 mg/dL — ABNORMAL HIGH (ref 70–99)
Glucose-Capillary: 117 mg/dL — ABNORMAL HIGH (ref 70–99)
Glucose-Capillary: 142 mg/dL — ABNORMAL HIGH (ref 70–99)

## 2021-07-15 MED ORDER — PROSOURCE PLUS PO LIQD
30.0000 mL | Freq: Two times a day (BID) | ORAL | Status: DC
  Administered 2021-07-16 – 2021-07-17 (×4): 30 mL via ORAL
  Filled 2021-07-15 (×4): qty 30

## 2021-07-15 MED ORDER — FUROSEMIDE 40 MG PO TABS
40.0000 mg | ORAL_TABLET | Freq: Once | ORAL | Status: AC
  Administered 2021-07-15: 40 mg via ORAL
  Filled 2021-07-15: qty 1

## 2021-07-15 MED ORDER — ENOXAPARIN SODIUM 40 MG/0.4ML IJ SOSY
40.0000 mg | PREFILLED_SYRINGE | INTRAMUSCULAR | Status: DC
Start: 2021-07-15 — End: 2021-07-17
  Administered 2021-07-15 – 2021-07-16 (×2): 40 mg via SUBCUTANEOUS
  Filled 2021-07-15 (×2): qty 0.4

## 2021-07-15 MED ORDER — MOMETASONE FURO-FORMOTEROL FUM 200-5 MCG/ACT IN AERO
2.0000 | INHALATION_SPRAY | Freq: Two times a day (BID) | RESPIRATORY_TRACT | Status: DC
Start: 2021-07-15 — End: 2021-07-17
  Administered 2021-07-16 – 2021-07-17 (×3): 2 via RESPIRATORY_TRACT
  Filled 2021-07-15 (×2): qty 8.8

## 2021-07-15 MED ORDER — ADULT MULTIVITAMIN W/MINERALS CH
1.0000 | ORAL_TABLET | Freq: Every day | ORAL | Status: DC
  Administered 2021-07-15 – 2021-07-17 (×3): 1 via ORAL
  Filled 2021-07-15 (×3): qty 1

## 2021-07-15 NOTE — Progress Notes (Signed)
Initial Nutrition Assessment  DOCUMENTATION CODES:   Not applicable  INTERVENTION:   -30 ml Prosource Plus BID, each supplement provides 100 kcals and 15 grams protein -MVI with minerals daily -Provided "Low Sodium Nutrition Therapy" handout from [facility] Nutrition Care Manual; attached to AVS/ discharge summary -Liberalize diet to gram sodium for wider variety of meal selections  NUTRITION DIAGNOSIS:   Increased nutrient needs related to acute illness as evidenced by estimated needs.  GOAL:   Patient will meet greater than or equal to 90% of their needs  MONITOR:   PO intake, Supplement acceptance  REASON FOR ASSESSMENT:   Consult Assessment of nutrition requirement/status  ASSESSMENT:   Pt with history of coronary disease, type 2 diabetes on insulin, reflux, hyperlipidemia, chronic hypoxic respiratory failure on 4 L of oxygen since February 2023 presents to the ER today with 3-week history of lower extremity edema.  Pt admitted with pulmonary artery hypertension and bilateral leg edema.   5/17-s/p rt heart cath  Reviewed I/O's: +240 ml x 24 hours and -800 ml since admission  UOP: 600 ml x 24 hours  Pt unavailable at time of visit. Attempted to speak with pt via call to hospital room phone, however, unable to reach. RD unable to obtain further nutrition-related history or complete nutrition-focused physical exam at this time.    Pt currently on a heart healthy, carb modified diet with 1.2 L fluid restriction. Pt with good oral intake; noted meal completions 70-100%.   Reviewed wt hx; no wt loss noted over the past 3 months. Suspect edema may be masking true weight loss as well as fat and muscle depletions.   Lab Results  Component Value Date   HGBA1C 6.6 (H) 07/11/2021   PTA DM medications are 750 mg metformin daily and 15 units insulin degludec daily.   Labs reviewed: CBGS: 78-177 (inpatient orders for glycemic control are 0-15 units inuslin aspart TID with meals  and 0-5 units insulin aspart daily at bedtime).    Diet Order:   Diet Order             Diet heart healthy/carb modified Room service appropriate? Yes; Fluid consistency: Thin; Fluid restriction: 1200 mL Fluid  Diet effective now                   EDUCATION NEEDS:   No education needs have been identified at this time  Skin:  Skin Assessment: Reviewed RN Assessment  Last BM:  07/13/21  Height:   Ht Readings from Last 1 Encounters:  07/12/21 _0  (1.6 m)    Weight:   Wt Readings from Last 1 Encounters:  07/12/21 74.1 kg    Ideal Body Weight:  52.3 kg  BMI:  Body mass index is 28.95 kg/m.  Estimated Nutritional Needs:   Kcal:  1650-1850  Protein:  85-100 grams  Fluid:  1.2 L    Loistine Chance, RD, LDN, [place] Registered Dietitian II Certified Diabetes Care and Education Specialist Please refer to [facility] for RD and/or RD on-call/weekend/after hours pager

## 2021-07-15 NOTE — Progress Notes (Signed)
Progress Note   Patient: Jessica Hart VFI:[id number] DOB: 12/26/1940 DOA: 07/12/2021     1 DOS: the patient was seen and examined on 07/15/2021   Brief hospital course: [name] was admitted to the hospital with the working diagnosis of decompensated heart failure.   81 yo female with the past medical history of pulmonary hypertension, T2DM, dyslipidemia, chronic hypoxemic respiratory failure who presented with lower extremity edema. Reported 3 weeks of worsening symptoms, refractive to medical therapy as outpatient with furosemide. On her initial physical examination blood pressure 115/77, HR 87, RR 20 and 02 saturation 87%, lungs with rales but no wheezing, heart with S1 and S2 present and rhythmic, abdomen not distended and positive lower extremity edema.   Na 142, K 2,6 CL 108, bicarbonate 11, glucose 111 bun 11 cr 1,0 BNP 727  Wbc 9,1 hgb 13,8 plt 237   Chest radiograph with cardiomegaly and bilateral interstitial markings.   EKG 89 bpm, normal axis, normal intervals, sinus rhythm with q wave in lead III and AvF, no significant ST segment changes, negative T in lead I and AvL.  Patient was placed on IV diuresis with furosemide.   Right heart catheterization confirming sever pulmonary hypertension with pulmonary artery pressure of 65/27, with mean of 44 mmHg.   Patient was placed on furosemide for diuresis.  She had worsening hypoxemia and increase 02 requirements, using high flow nasal cannula up to 14 l/min   CT chest with bilateral ground glass opacities, suggesting pulmonary edema. Negative for pulmonary embolism.   Placed on bronchodilator therapy and inhaled corticosteroids.      Assessment and Plan: * Pulmonary artery hypertension (HCC) Acute on chronic diastolic heart failure.  Acute on chronic hypoxemic respiratory failure.   Urine output is 600 ml.  Systolic blood pressure  416 to 117 mmHg.   Cardiac catheterization with right atrial pressure 23/22  with mean 17, RV 67/21. PA 65/27, mean 44, LV end diastolic pressure 4 (surrogate for PCWP that was not able to obtain) and SVC 02 saturation 61%.  Pulmonary resistance is 10 woods.   CT chest with no pulmonary embolism. Diffuse ground glass pulmonary infiltrate, interlobular septal thickening, and trace right pleural effusion. Traction bronchiectasis, at the lower lobes and signs of interstitial lung disease.   04/2021 High resolution CT chest with septal thickening, scattered areas of mild subpleural reticulation and cylindrical bronchiectasis. Mild emphysema, with no frank honeycombing.   Continue with bronchodilator therapy and air way clearing techniques with flutter valve and incentive spirometer.   Add inhaled corticosteroids Resume diuresis with oral furosemide and continue dapagliflozin.   At the time of my examination able to wean down supplemental 02 to 10 L/min with 02 saturation 92%.        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  Continue blood pressure monitoring.   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  Fasting glucose today is 85.   GERD (gastroesophageal reflux disease) Continue with antiacid therapy.   AKI (acute kidney injury) ([place]) Hypokalemia, Hypomagnesemia.   Renal function has improved, with serum cr at 1,0 with K at 3,6 and serum bicarbonate at 21. Plan to continue diuresis with furosemide and follow up renal function in am, avoid hypotension and nephrotoxic medications.         Subjective: Patient with no worsening dyspnea or edema. No chest pain   Physical Exam: Vitals:   07/15/21 6063 07/15/21 0906 07/15/21 0910  07/15/21 1404  BP: 126/73 124/66    Pulse:  100    Resp: 16     Temp: 97.8 F (36.6 C)     TempSrc: Oral     SpO2: 90%  (!) 88% 90%  Weight:      Height:       Neurology awake and alert ENT with no pallor Cardiovascular with S1 and S2 present  and rhythmic with no gallops or rubs, positive murmur at the right sternal border Positive JVD Trace pitting lower extremity edema Respiratory with rales bilaterally at bases Abdomen not distended  Data Reviewed:    Family Communication: no family at the bedside   Disposition: Status is: Inpatient Remains inpatient appropriate because: heart failure and respiratory failure   Planned Discharge Destination: Home      Author: Tawni Millers, MD 07/15/2021 2:44 PM  For on call review [email or web address].

## 2021-07-15 NOTE — TOC Initial Note (Signed)
Transition of Care [facility]) - Initial/Assessment Note    Patient Details  Name: Jessica Hart MRN: [id number] Date of Birth: Mar 07, 1940  Transition of Care [facility]) CM/SW Contact:    Bethena Roys, RN Phone Number: 07/15/2021, 12:00 PM  Clinical Narrative: Patient presented for lower extremity edema. PTA patient was from home with the support of family. Patient has oxygen in the home 4 liters baseline. Patient is currently on high flow oxygen. Case Manager will continue to follow for disposition needs.                 Expected Discharge Plan: [place] Barriers to Discharge: Continued Medical Work up  Expected Discharge Plan and Services Expected Discharge Plan: [place]   Discharge Planning Services: CM Consult   Living arrangements for the past 2 months: Single Family Home                   Prior Living Arrangements/Services Living arrangements for the past 2 months: Single Family Home Lives with:: Spouse Patient language and need for interpreter reviewed:: Yes        Need for Family Participation in Patient Care: Yes (Comment) Care giver support system in place?: Yes (comment) Current home services: DME (patient has oxygen at 4 Liters.) Criminal Activity/Legal Involvement Pertinent to Current Situation/Hospitalization: No - Comment as needed   Permission Sought/Granted Permission sought to share information with : Case Manager, Family Supports      Emotional Assessment Appearance:: Appears stated age       Alcohol / Substance Use: Not Applicable Psych Involvement: No (comment)  Admission diagnosis:  Pulmonary artery hypertension ([place]) [I27.21] Respiratory failure ([place]) [J96.90] Patient Active Problem List   Diagnosis Date Noted   Pulmonary artery hypertension ([place]) 07/12/2021   Hypokalemia 07/12/2021   Muscle cramp 03/31/2021   Pulmonary emphysema ([place]) 02/17/2021   Abnormal chest CT 02/17/2021   Pulmonary  nodules 02/17/2021   SOB (shortness of breath) 02/08/2021   Degenerative joint disease of knee, left 22/58/3462   Lichen sclerosus of vulva 02/02/2020   Type 2 diabetes mellitus with hypertriglyceridemia ([place]) 02/01/2020   S/P CABG x 3 09/02/2019   Coronary artery disease 09/02/2019   Type 2 diabetes mellitus with hyperglycemia ([place]) 04/23/2018   Coronary artery calcification seen on CAT scan 01/14/2018   Former smoker 01/10/2017   Hypertension    Proteinuria 01/06/2016   Foot callus 03/22/2013   Vaginal atrophy 03/27/2012   GERD (gastroesophageal reflux disease) 09/05/2011   Atherosclerosis 12/28/2009   Obesity 04/26/2006   ALOPECIA NOS, BALDNESS 04/26/2006   OA (osteoarthritis) 04/26/2006   Disorder of bone and cartilage 04/26/2006   PCP:  Carlena Hurl, PA-C Pharmacy:   [id number]: [phone]: [phone]  Readmission Risk Interventions     View : No data to display.

## 2021-07-15 NOTE — Progress Notes (Addendum)
Progress Note  Patient Name: Jessica Hart Date of Encounter: 07/15/2021  [facility] Cardiologist: Elouise Munroe, MD   Subjective   Patient denies any chest pain. Continues to have SOB and is wearing  with 10 L oxygen   Inpatient Medications    Scheduled Meds:  aspirin EC  81 mg Oral Daily   dapagliflozin propanediol  5 mg Oral Daily   heparin  5,000 Units Subcutaneous Q8H   insulin aspart  0-15 Units Subcutaneous TID WC   insulin aspart  0-5 Units Subcutaneous QHS   ipratropium-albuterol  3 mL Nebulization Q6H   metoprolol tartrate  37.5 mg Oral BID   pantoprazole  40 mg Oral Daily   sodium chloride flush  3 mL Intravenous Q12H   Continuous Infusions:  PRN Meds: acetaminophen **OR** acetaminophen, ipratropium-albuterol, ondansetron **OR** ondansetron (ZOFRAN) IV   Vital Signs    Vitals:   07/14/21 2016 07/15/21 0252 07/15/21 0300 07/15/21 0429  BP: (!) 157/82   117/63  Pulse: 93   78  Resp: 18   18  Temp: 98.3 F (36.8 C)   98.2 F (36.8 C)  TempSrc: Oral   Oral  SpO2: 90% 92% (S) 92% 91%  Weight:      Height:        Intake/Output Summary (Last 24 hours) at 07/15/2021 0804 Last data filed at 07/14/2021 2019 Gross per 24 hour  Intake 600 ml  Output 600 ml  Net 0 ml      07/12/2021    3:39 PM 07/11/2021    3:07 PM 06/12/2021    9:04 PM  Last 3 Weights  Weight (lbs) 163 lb 6.4 oz 163 lb 6.4 oz 163 lb  Weight (kg) 74.118 kg 74.118 kg 73.936 kg      Telemetry    Sinus rhythm, HR in the 70s-80s - Personally Reviewed  ECG    No new tracings - Personally Reviewed  Physical Exam   GEN: No acute distress. Wearing nasal cannula with 10 L supplemental oxygen  Neck: No JVD Cardiac: RRR, no murmurs, rubs, or gallops.  Respiratory: Crackles in bilateral lung bases  GI: Soft, nontender, non-distended  MS: Trace edema; No deformity. Neuro:  Nonfocal  Psych: Normal affect   Labs    High Sensitivity Troponin:   Recent Labs  Lab  07/12/21 1835 07/12/21 2134  TROPONINIHS 16 14     Chemistry Recent Labs  Lab 07/13/21 0300 07/13/21 1649 07/14/21 0302 07/15/21 0210  NA 139 144 141 141  K 3.2* 4.9 4.3 3.6  CL 110  --  113* 112*  CO2 20*  --  22 21*  GLUCOSE 90  --  92 85  BUN 10  --  17 17  CREATININE 0.86  --  1.14* 1.03*  CALCIUM 8.8*  --  8.8* 8.9  MG 1.9  --   --   --   PROT 6.9  --   --   --   ALBUMIN 3.5  --   --   --   AST 28  --   --   --   ALT 21  --   --   --   ALKPHOS 102  --   --   --   BILITOT 1.4*  --   --   --   GFRNONAA >60  --  49* 55*  ANIONGAP 9  --  6 8    Lipids No results for input(s): CHOL, TRIG, HDL, LABVLDL, LDLCALC, CHOLHDL  in the last 168 hours.  Hematology Recent Labs  Lab 07/12/21 1546 07/13/21 0300 07/13/21 1649  WBC 9.1 8.5  --   RBC 4.97 4.80  --   HGB 13.8 13.4 14.3  HCT 42.5 41.0 42.0  MCV 85.5 85.4  --   MCH 27.8 27.9  --   MCHC 32.5 32.7  --   RDW 16.7* 16.6*  --   PLT 237 239  --    Thyroid No results for input(s): TSH, FREET4 in the last 168 hours.  BNP Recent Labs  Lab 07/11/21 1535 07/12/21 1546  BNP 658.5* 727.3*    DDimer No results for input(s): DDIMER in the last 168 hours.   Radiology    CT Angio Chest Pulmonary Embolism (PE) W or WO Contrast  Result Date: 07/14/2021 CLINICAL DATA:  Pulmonary embolism (PE) suspected, high prob. Acute on chronic hypoxemic respiratory failure. Lower extremity edema. EXAM: CT ANGIOGRAPHY CHEST WITH CONTRAST TECHNIQUE: Multidetector CT imaging of the chest was performed using the standard protocol during bolus administration of intravenous contrast. Multiplanar CT image reconstructions and MIPs were obtained to evaluate the vascular anatomy. RADIATION DOSE REDUCTION: This exam was performed according to the departmental dose-optimization program which includes automated exposure control, adjustment of the mA and/or kV according to patient size and/or use of iterative reconstruction technique. CONTRAST:  61m  OMNIPAQUE IOHEXOL 350 MG/ML SOLN COMPARISON:  05/19/2021 FINDINGS: Cardiovascular: There is adequate opacification the pulmonary arterial tree. No intraluminal filling defect identified to suggest acute pulmonary embolism. The central pulmonary arteries are minimally dilated suggesting changes of pulmonary arterial hypertension. Coronary artery bypass grafting has been performed. There is global cardiomegaly with particular enlargement of the right ventricle and right atrium with reflux of contrast into the hepatic venous system suggesting elevated right heart pressure and at least some degree of right heart failure. No pericardial effusion. Extensive atherosclerotic calcification within the thoracic aorta. No aortic aneurysm. Particularly prominent atherosclerotic calcification noted at the origin of the left common carotid artery, however, the degree of stenosis is not well assessed on this examination due to phase of contrast administration. Mediastinum/Nodes: Visualized thyroid is unremarkable. Borderline mediastinal adenopathy within the prevascular, right hilar, and subcarinal lymph node groups appears stable since prior examination, nonspecific. The esophagus is unremarkable. Small hiatal hernia. Lungs/Pleura: Subtle diffuse ground-glass pulmonary infiltrate, interlobular septal thickening, and trace right pleural effusion is present, new since prior examination in keeping with pulmonary edema and likely reflecting changes of at least mild cardiogenic failure. Superimposed minimal subpleural architectural distortion with traction bronchiolectasis best appreciated within the posterior basal segments of the lower lobes bilaterally is stable since prior examination in keeping with underlying interstitial lung disease. No pneumothorax. Central airways are widely patent. Upper Abdomen: Trace ascites, new since prior examination. No acute abnormality. Musculoskeletal: No acute bone abnormality. Review of the MIP  images confirms the above findings. IMPRESSION: No pulmonary embolism. Morphologic changes in keeping with elevated pulmonary and right heart pressure. Findings in keeping with at least mild cardiogenic failure with mild interstitial and alveolar pulmonary edema, small right pleural effusion, mild ascites, and prominent reflux of contrast into the hepatic venous system. Stable borderline mediastinal adenopathy, nonspecific. This may be reactive in the setting of cardiogenic failure. Stable chronic interstitial lung changes. Status post coronary artery bypass grafting. Aortic atherosclerosis with prominent atherosclerotic calcification involving the arch vasculature. If there is evidence of cerebrovascular insufficiency, CT arteriography may be more helpful for further evaluation. Aortic Atherosclerosis (ICD10-I70.0). Electronically Signed   By:  Fidela Salisbury M.D.   On: 07/14/2021 20:06   CARDIAC CATHETERIZATION  Result Date: 07/13/2021 RHC: Right atrial pressure 23/22 mmHg ith a mean of 17 mmHg Right ventricular pressure of 67/-2 mmHg with a right ventricular end-diastolic pressure of 21 mmHg Pulmonary artery pressure of 65/27 mmHg with a mean pulmonary artery pressure 44 mmHg Left ventricular end-diastolic pressure of 4 mmHg SVC oxygen saturation of 61% Pulmonary artery oxygen saturation 59% Arterial oxygen saturation of 92% Fick cardiac output of 4.18 L/min Fick cardiac index of 2.35 L/min If the left ventricular end-diastolic pressure is taken as surrogate for the the left atrial pressure the pulmonary vascular resistance is approximately 10 Woods units (see technique section for further details as wedge pressure could not be obtained). Conclusion:  Right heart catheterization with pulmonary arterial hypertension with a mean pulmonary artery pressure of 44 mmHg and a pulmonary vascular resistance of approximately 10 Wood units.  VAS [place] UPPER EXTREMITY VENOUS DUPLEX  Result Date: 07/14/2021 UPPER VENOUS  STUDY  Patient Name:  CADYN RODGER  Date of Exam:   07/14/2021 Medical Rec #: [id number]            Accession #:    [id number] Date of Birth: [birth year]            Patient Gender: F Patient Age:   42 years Exam Location:  [facility] Procedure:      VAS [place] UPPER EXTREMITY VENOUS DUPLEX Referring Phys: Vikki Ports --------------------------------------------------------------------------------  Indications: Pain, knotting, and Swelling in right neck status post radial cath. Swelling and pain left neck post line placement and removal. Limitations: Breathing, constant movement. Patient also on 9 liters of oxygen. Comparison Study: No prior study Performing Technologist: Sharion Dove RVS Supporting Technologist: Rogelia Rohrer RVT, RDMS  Examination Guidelines: A complete evaluation includes B-mode imaging, spectral Doppler, color Doppler, and power Doppler as needed of all accessible portions of each vessel. Bilateral testing is considered an integral part of a complete examination. Limited examinations for reoccurring indications may be performed as noted.  Right Findings: +----------+------------+---------+-----------+----------+-------+ RIGHT     CompressiblePhasicitySpontaneousPropertiesSummary +----------+------------+---------+-----------+----------+-------+ IJV                      Yes       Yes                      +----------+------------+---------+-----------+----------+-------+ Subclavian               Yes       Yes                      +----------+------------+---------+-----------+----------+-------+  Left Findings: +----------+------------+---------+-----------+----------+-------------------+ LEFT      CompressiblePhasicitySpontaneousProperties      Summary       +----------+------------+---------+-----------+----------+-------------------+ IJV           Full       Yes       Yes              Rouleaux flow noted  +----------+------------+---------+-----------+----------+-------------------+ Subclavian               Yes       Yes                                  +----------+------------+---------+-----------+----------+-------------------+  Summary:  Right: There is no DVT noted in the right IJV or subclavian vein.  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 Unable to locate the neck of the pseudoaneurysm secondary to breathing and movement.  Left: No DVT noted in the IJV or the subclavian vein.  *See table(s) above for measurements and observations.    Preliminary     Cardiac Studies   Right Heart Cath 07/13/21  RHC: Right atrial pressure 23/22 mmHg ith a mean of 17 mmHg Right ventricular pressure of 67/-2 mmHg with a right ventricular end-diastolic pressure of 21 mmHg Pulmonary artery pressure of 65/27 mmHg with a mean pulmonary artery pressure 44 mmHg  Left ventricular end-diastolic pressure of 4 mmHg SVC oxygen saturation of 61%   Pulmonary artery oxygen saturation 59% Arterial oxygen saturation of 92%   Fick cardiac output of 4.18 L/min Fick cardiac index of 2.35 L/min   If the left ventricular end-diastolic pressure is taken as surrogate for the the left atrial pressure the pulmonary vascular resistance is approximately 10 Woods units (see technique section for further details as wedge pressure could not be obtained).   Conclusion:  Right heart catheterization with pulmonary arterial hypertension with a mean pulmonary artery pressure of 44 mmHg and a pulmonary vascular resistance of approximately 10 Wood units.  Patient Profile     81 y.o. female with a hx of DM2, HTN, HL, CAD with prior CABG, chronic resp failure on 4l [place], pulm HTN who was seen 07/12/2021 for the evaluation of SOB at the request of Dr Bridgett Larsson.  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 Likely that her pulmonary HTN is due to lung disease rather left heart failure  - Needs outpatient sleep study  - ARB has been held due to elevated creatinine on admission-- continue to hold for now as creatinine is very minimally elevated but her BP is soft  - Patient received lasix 40 mg IV x1 on 5/17 - We held lasix yesterday due to elevated creatinine. Creatinine improved to 1.03 today (so very minimally elevated). Does have crackles in lung bases on exam, CT chest showed findings consistent with mild pulmonary edema. 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  Patient examined.  I agree with the findings as above.   Neck is OK.  No pain. Breathing OK although she is requiring higher O2.  The patient exam reveals COR:RRR  ,  Lungs: Decreased  breath sounds with bilateral basilar crackles  ,  Abd: Positive bowel sounds, no rebound no guarding, Ext :  Trace edema   .  All available labs, radiology testing, previous records reviewed. Agree with documented assessment and plan.   Acute diastolic HF:    EDP was low.  Agree with restarting PO Lasix.  No other change in therapy suggested.   Hypoxemia:  Pending further out patient pulmonary work up although they might need to see her in patient if O2 requirements remain higher than baseline.  Primary team is starting bronchodilators.   Jeneen Rinks Kamyia Thomason  11:45 AM  07/15/2021

## 2021-07-15 NOTE — Discharge Instructions (Addendum)
Low Sodium Nutrition Therapy  Eating less sodium can help you if you have high blood pressure, heart failure, or kidney or liver disease.   Your body needs a little sodium, but too much sodium can cause your body to hold onto extra water. This extra water will raise your blood pressure and can cause damage to your heart, kidneys, or liver as they are forced to work harder.   Sometimes you can see how the extra fluid affects you because your hands, legs, or belly swell. You may also hold water around your heart and lungs, which makes it hard to breathe.   Even if you take medication for blood pressure or a water pill (diuretic) to remove fluid, it is still important to have less salt in your diet.   Check with your primary care provider before drinking alcohol since it may affect the amount of fluid in your body and how your heart, kidneys, or liver work. Sodium in Food A low-sodium meal plan limits the sodium that you get from food and beverages to 1,500-2,000 milligrams (mg) per day. Salt is the main source of sodium. Read the nutrition label on the package to find out how much sodium is in one serving of a food.  Select foods with 140 milligrams (mg) of sodium or less per serving.  You may be able to eat one or two servings of foods with a little more than 140 milligrams (mg) of sodium if you are closely watching how much sodium you eat in a day.  Check the serving size on the label. The amount of sodium listed on the label shows the amount in one serving of the food. So, if you eat more than one serving, you will get more sodium than the amount listed.  Tips Cutting Back on Sodium Eat more fresh foods.  Fresh fruits and vegetables are low in sodium, as well as frozen vegetables and fruits that have no added juices or sauces.  Fresh meats are lower in sodium than processed meats, such as bacon, sausage, and hotdogs.  Not all processed foods are unhealthy, but some processed foods may have too  much sodium.  Eat less salt at the table and when cooking. One of the ingredients in salt is sodium.  One teaspoon of table salt has 2,300 milligrams of sodium.  Leave the salt out of recipes for pasta, casseroles, and soups. Be a Paramedic.  Food packages that say "Salt-free", sodium-free", "very low sodium," and "low sodium" have less than 140 milligrams of sodium per serving.  Beware of products identified as "Unsalted," "No Salt Added," "Reduced Sodium," or "Lower Sodium." These items may still be high in sodium. You should always check the nutrition label. Add flavors to your food without adding sodium.  Try lemon juice, lime juice, or vinegar.  Dry or fresh herbs add flavor.  Buy a sodium-free seasoning blend or make your own at home. You can purchase salt-free or sodium-free condiments like barbeque sauce in stores and online. Ask your registered dietitian nutritionist for recommendations and where to find them.   Eating in Restaurants Choose foods carefully when you eat outside your home. Restaurant foods can be very high in sodium. Many restaurants provide nutrition facts on their menus or their websites. If you cannot find that information, ask your server. Let your server know that you want your food to be cooked without salt and that you would like your salad dressing and sauces to be served on the  side.    Foods Recommended Food Group Foods Recommended  Grains Bread, bagels, rolls without salted tops Homemade bread made with reduced-sodium baking powder Cold cereals, especially shredded wheat and puffed rice Oats, grits, or cream of wheat Pastas, quinoa, and rice Popcorn, pretzels or crackers without salt Corn tortillas  Protein Foods Fresh meats and fish; [place] bacon (check the nutrition labels - make sure they are not packaged in a sodium solution) Canned or packed tuna (no more than 4 ounces at 1 serving) Beans and peas Soybeans) and tofu Eggs Nuts or nut butters  without salt  Dairy Milk or milk powder Plant milks, such as rice and soy Yogurt, including Greek yogurt Small amounts of natural cheese (blocks of cheese) or reduced-sodium cheese can be used in moderation. (Swiss, ricotta, and fresh mozzarella cheese are lower in sodium than the others) Cream Cheese Low sodium cottage cheese  Vegetables Fresh and frozen vegetables without added sauces or salt Homemade soups (without salt) Low-sodium, salt-free or sodium-free canned vegetables and soups  Fruit Fresh and canned fruits Dried fruits, such as raisins, cranberries, and prunes  Oils Tub or liquid margarine, regular or without salt Canola, corn, peanut, olive, safflower, or sunflower oils  Condiments Fresh or dried herbs such as basil, bay leaf, dill, mustard (dry), nutmeg, paprika, parsley, rosemary, sage, or thyme.  Low sodium ketchup Vinegar  Lemon or lime juice Pepper, red pepper flakes, and cayenne. Hot sauce contains sodium, but if you use just a drop or two, it will not add up to much.  Salt-free or sodium-free seasoning mixes and marinades Simple salad dressings: vinegar and oil   Foods Not Recommended Food Group Foods Not Recommended  Grains Breads or crackers topped with salt Cereals (hot/cold) with more than 300 mg sodium per serving Biscuits, cornbread, and other "quick" breads prepared with baking soda Pre-packaged bread crumbs Seasoned and packaged rice and pasta mixes Self-rising flours  Protein Foods Cured meats: Bacon, ham, sausage, pepperoni and hot dogs Canned meats (chili, vienna sausage, or sardines) Smoked fish and meats Frozen meals that have more than 600 mg of sodium per serving Egg substitute (with added sodium)  Dairy Buttermilk Processed cheese spreads Cottage cheese (1 cup may have over 500 mg of sodium; look for low-sodium.) American or feta cheese Shredded Cheese has more sodium than blocks of cheese String cheese  Vegetables Canned vegetables  (unless they are salt-free, sodium-free or low sodium) Frozen vegetables with seasoning and sauces Sauerkraut and pickled vegetables Canned or dried soups (unless they are salt-free, sodium-free, or low sodium) [place] fries and onion rings  Fruit Dried fruits preserved with additives that have sodium  Oils Salted butter or margarine, all types of olives  Condiments Salt, sea salt, kosher salt, onion salt, and garlic salt Seasoning mixes with salt Bouillon cubes Ketchup Barbeque sauce and Worcestershire sauce unless low sodium Soy sauce Salsa, pickles, olives, relish Salad dressings: ranch, blue cheese, [place], and [place].   Low Sodium Sample 1-Day Menu  Breakfast 1 cup cooked oatmeal  1 slice whole wheat bread toast  1 tablespoon peanut butter without salt  1 banana  1 cup 1% milk  Lunch Tacos made with: 2 corn tortillas   cup black beans, low sodium   cup roasted or grilled chicken (without skin)   avocado  Squeeze of lime juice  1 cup salad greens  1 tablespoon low-sodium salad dressing   cup strawberries  1 orange  Afternoon Snack 1/3 cup grapes  6 ounces yogurt  Evening Meal 3 ounces herb-baked fish  1 baked potato  2 teaspoons olive oil   cup cooked carrots  2 thick slices tomatoes on:  2 lettuce leaves  1 teaspoon olive oil  1 teaspoon balsamic vinegar  1 cup 1% milk  Evening Snack 1 apple   cup almonds without salt   Low-Sodium Vegetarian (Lacto-Ovo) Sample 1-Day Menu  Breakfast 1 cup cooked oatmeal  1 slice whole wheat toast  1 tablespoon peanut butter without salt  1 banana  1 cup 1% milk  Lunch Tacos made with: 2 corn tortillas   cup black beans, low sodium   cup roasted or grilled chicken (without skin)   avocado  Squeeze of lime juice  1 cup salad greens  1 tablespoon low-sodium salad dressing   cup strawberries  1 orange  Evening Meal Stir fry made with:  cup tofu  1 cup brown rice   cup broccoli   cup green beans   cup  peppers   tablespoon peanut oil  1 orange  1 cup 1% milk  Evening Snack 4 strips celery  2 tablespoons hummus  1 hard-boiled egg   Low-Sodium Vegan Sample 1-Day Menu  Breakfast 1 cup cooked oatmeal  1 tablespoon peanut butter without salt  1 cup blueberries  1 cup soymilk fortified with calcium, vitamin B12, and vitamin D  Lunch 1 small whole wheat pita   cup cooked lentils  2 tablespoons hummus  4 carrot sticks  1 medium apple  1 cup soymilk fortified with calcium, vitamin B12, and vitamin D  Evening Meal Stir fry made with:  cup tofu  1 cup brown rice   cup broccoli   cup green beans   cup peppers   tablespoon peanut oil  1 cup cantaloupe  Evening Snack 1 cup soy yogurt   cup mixed nuts  Copyright 2020  Academy [employer]. All rights reserved  Sodium Free Flavoring Tips  When cooking, the following items may be used for flavoring instead of salt or seasonings that contain sodium. Remember: A little bit of spice goes a long way! Be careful not to overseason. Spice Blend Recipe (makes about ? cup) 5 teaspoons onion powder  2 teaspoons garlic powder  2 teaspoons paprika  2 teaspoon dry mustard  1 teaspoon crushed thyme leaves   teaspoon white pepper   teaspoon celery seed Food Item Flavorings  Beef Basil, bay leaf, caraway, curry, dill, dry mustard, garlic, grape jelly, green pepper, mace, marjoram, mushrooms (fresh), nutmeg, onion or onion powder, parsley, pepper, rosemary, sage  Chicken Basil, cloves, cranberries, mace, mushrooms (fresh), nutmeg, oregano, paprika, parsley, pineapple, saffron, sage, savory, tarragon, thyme, tomato, turmeric  Egg Chervil, curry, dill, dry mustard, garlic or garlic powder, green pepper, jelly, mushrooms (fresh), nutmeg, onion powder, paprika, parsley, rosemary, tarragon, tomato  Fish Basil, bay leaf, chervil, curry, dill, dry mustard, green pepper, lemon juice, marjoram, mushrooms (fresh), paprika, pepper,  tarragon, tomato, turmeric  Lamb Cloves, curry, dill, garlic or garlic powder, mace, mint, mint jelly, onion, oregano, parsley, pineapple, rosemary, tarragon, thyme  Pork Applesauce, basil, caraway, chives, cloves, garlic or garlic powder, onion or onion powder, rosemary, thyme  Veal Apricots, basil, bay leaf, currant jelly, curry, ginger, marjoram, mushrooms (fresh), oregano, paprika  Vegetables Basil, dill, garlic or garlic powder, ginger, lemon juice, mace, marjoram, nutmeg, onion or onion powder, tarragon, tomato, sugar or sugar substitute, salt-free salad dressing, vinegar  Desserts Allspice, anise, cinnamon, cloves, ginger, mace, nutmeg, vanilla extract, other  extracts   Copyright 2020  Academy of [employer]. All rights reserved  Fluid Restricted Nutrition Therapy  You have been prescribed this diet because your condition affects how much fluid you can eat or drink. If your heart, liver, or kidneys aren't working properly, you may not be able to effectively eliminate fluids from the body and this may cause swelling (edema) in the legs, arms, and/or stomach. Drink no more than 1.2 liters or 40 ounces or 5 cups of fluid per day.  You don't need to stop eating or drinking the same fluids you normally would, but you may need to eat or drink less than usual.  Your registered dietitian nutritionist will help you determine the correct amount of fluid to consume during the day Breakfast Include fluids taken with medications  Lunch Include fluids taken with medications  Dinner Include fluids taken with medications  Bedtime Snack Include fluids taken with medications     Tips What Are Fluids?  A fluid is anything that is liquid or anything that would melt if left at room temperature. You will need to count these foods and liquids--including any liquid used to take medication--as part of your daily fluid intake. Some examples are: Alcohol (drink only with your doctor's permission)   Coffee, tea, and other hot beverages  Gelatin (Jell-O)  Gravy  Ice cream, sherbet, sorbet  Ice cubes, ice chips  Milk, liquid creamer  Nutritional supplements  Popsicles  Vegetable and fruit juices; fluid in canned fruit  Watermelon  Yogurt  Soft drinks, lemonade, limeade  Soups  Syrup How Do I Measure My Fluid Intake? Record your fluid intake daily.  Tip: Every day, each time you eat or drink fluids, pour water in the same amount into an empty container that can hold the same amount of fluids you are allowed daily. This may help you keep track of how much fluid you are taking in throughout the day.  To accurately keep track of how much liquid you take in, measure the size of the cups, glasses, and bowls you use. If you eat soup, measure how much of it is liquid and how much is solid (such as noodles, vegetables, meat). Conversions for Measuring Fluid Intake  Milliliters (mL) Liters (L) Ounces (oz) Cups (c)  1000 1 32 4  1200 1.2 40 5  1500 1.5 50 6 1/4  1800 1.8 60 7 1/2  2000 2 67 8 1/3  Tips to Reduce Your Thirst Chew gum or suck on hard candy.  Rinse or gargle with mouthwash. Do not swallow.  Ice chips or popsicles my help quench thirst, but this too needs to be calculated into the total restriction. Melt ice chips or cubes first to figure out how much fluid they produce (for example, experiment with melting  cup ice chips or 2 ice cubes).  Add a lemon wedge to your water.  Limit how much salt you take in. A high salt intake might make you thirstier.  Don't eat or drink all your allowed liquids at once. Space your liquids out through the day.  Use small glasses and cups and sip slowly. If allowed, take your medications with fluids you eat or drink during a meal.   Fluid-Restricted Nutrition Therapy Sample 1-Day Menu  Breakfast 1 slice wheat toast  1 tablespoon peanut butter  1/2 cup yogurt (120 milliliters)  1/2 cup blueberries  1 cup milk (240 milliliters)   Lunch 3  ounces sliced [place]  2 slices whole  wheat bread  1/2 cup lettuce for sandwich  2 slices tomato for sandwich  1 ounce reduced-fat, reduced-sodium cheese  1/2 cup fresh carrot sticks  1 banana  1 cup unsweetened tea (240 milliliters)   Evening Meal 8 ounces soup (240 milliliters)  3 ounces salmon  1/2 cup quinoa  1 cup green beans  1 cup mixed greens salad  1 tablespoon olive oil  1 cup coffee (240 milliliters)  Evening Snack 1/2 cup sliced peaches  1/2 cup frozen yogurt (120 milliliters)  1 cup water (240 milliliters)  Copyright 2020  Academy of [employer] Dietetics. All rights reserved

## 2021-07-16 DIAGNOSIS — N179 Acute kidney failure, unspecified: Secondary | ICD-10-CM | POA: Diagnosis not present

## 2021-07-16 DIAGNOSIS — E781 Pure hyperglyceridemia: Secondary | ICD-10-CM | POA: Diagnosis not present

## 2021-07-16 DIAGNOSIS — E1169 Type 2 diabetes mellitus with other specified complication: Secondary | ICD-10-CM | POA: Diagnosis not present

## 2021-07-16 DIAGNOSIS — Z951 Presence of aortocoronary bypass graft: Secondary | ICD-10-CM | POA: Diagnosis not present

## 2021-07-16 DIAGNOSIS — I2721 Secondary pulmonary arterial hypertension: Secondary | ICD-10-CM | POA: Diagnosis not present

## 2021-07-16 DIAGNOSIS — K219 Gastro-esophageal reflux disease without esophagitis: Secondary | ICD-10-CM | POA: Diagnosis not present

## 2021-07-16 LAB — GLUCOSE, CAPILLARY
Glucose-Capillary: 87 mg/dL (ref 70–99)
Glucose-Capillary: 197 mg/dL — ABNORMAL HIGH (ref 70–99)
Glucose-Capillary: 110 mg/dL — ABNORMAL HIGH (ref 70–99)
Glucose-Capillary: 100 mg/dL — ABNORMAL HIGH (ref 70–99)

## 2021-07-16 LAB — BASIC METABOLIC PANEL
Anion gap: 9 (ref 5–15)
BUN: 13 mg/dL (ref 8–23)
CO2: 22 mmol/L (ref 22–32)
Calcium: 8.7 mg/dL — ABNORMAL LOW (ref 8.9–10.3)
Chloride: 110 mmol/L (ref 98–111)
Creatinine, Ser: 0.81 mg/dL (ref 0.44–1.00)
GFR, Estimated: >60 mL/min (ref >60)
Glucose, Bld: 75 mg/dL (ref 70–99)
Potassium: 3 mmol/L — ABNORMAL LOW (ref 3.5–5.1)
Sodium: 141 mmol/L (ref 135–145)

## 2021-07-16 MED ORDER — IPRATROPIUM-ALBUTEROL 0.5-2.5 (3) MG/3ML IN SOLN
3.0000 mL | Freq: Three times a day (TID) | RESPIRATORY_TRACT | Status: DC
  Administered 2021-07-16 – 2021-07-17 (×2): 3 mL via RESPIRATORY_TRACT
  Filled 2021-07-16 (×2): qty 3

## 2021-07-16 MED ORDER — POTASSIUM CHLORIDE CRYS ER 20 MEQ PO TBCR
40.0000 meq | EXTENDED_RELEASE_TABLET | ORAL | Status: AC
  Administered 2021-07-16 (×2): 40 meq via ORAL
  Filled 2021-07-16 (×2): qty 2

## 2021-07-16 NOTE — Progress Notes (Addendum)
Progress Note   Patient: Jessica Hart MGQ:[id number] DOB: [birth year] DOA: 07/12/2021     2 DOS: the patient was seen and examined on 07/16/2021   Brief hospital course: Mrs. Jessica Hart was admitted to the hospital with the working diagnosis of decompensated heart failure.   81 yo female with the past medical history of pulmonary hypertension, T2DM, dyslipidemia, chronic hypoxemic respiratory failure who presented with lower extremity edema. Reported 3 weeks of worsening symptoms, refractive to medical therapy as outpatient with furosemide. On her initial physical examination blood pressure 115/77, HR 87, RR 20 and 02 saturation 87%, lungs with rales but no wheezing, heart with S1 and S2 present and rhythmic, abdomen not distended and positive lower extremity edema.   Na 142, K 2,6 CL 108, bicarbonate 11, glucose 111 bun 11 cr 1,0 BNP 727  Wbc 9,1 hgb 13,8 plt 237   Chest radiograph with cardiomegaly and bilateral interstitial markings.   EKG 89 bpm, normal axis, normal intervals, sinus rhythm with q wave in lead III and AvF, no significant ST segment changes, negative T in lead I and AvL.  Patient was placed on IV diuresis with furosemide.   Right heart catheterization confirming sever pulmonary hypertension with pulmonary artery pressure of 65/27, with mean of 44 mmHg.   Patient was placed on furosemide for diuresis.  She had worsening hypoxemia and increase 02 requirements, using high flow nasal cannula up to 14 l/min   CT chest with bilateral ground glass opacities, suggesting pulmonary edema. Negative for pulmonary embolism.   Placed on bronchodilator therapy and inhaled corticosteroids.  Slowly improving oxygenation.      Assessment and Plan: * Pulmonary artery hypertension (HCC) Acute on chronic diastolic heart failure.  Acute on chronic hypoxemic respiratory failure.   Urine output is 1,450 ml.  Systolic blood pressure  267 to 117 mmHg.   Cardiac catheterization  with right atrial pressure 23/22 with mean 17, RV 67/21. PA 65/27, mean 44, LV end diastolic pressure 4 (surrogate for PCWP that was not able to obtain) and SVC 02 saturation 61%.  Pulmonary resistance is 10 woods.   CT chest with no pulmonary embolism. Diffuse ground glass pulmonary infiltrate, interlobular septal thickening, and trace right pleural effusion. Traction bronchiectasis, at the lower lobes and signs of interstitial lung disease.   04/2021 High resolution CT chest with septal thickening, scattered areas of mild subpleural reticulation and cylindrical bronchiectasis. Mild emphysema, with no frank honeycombing.   Continue with bronchodilator therapy and air way clearing techniques with flutter valve and incentive spirometer.   Continue with inhaled corticosteroids Diuresis with oral furosemide and dapagliflozin.   Continue with metoprolol.   Continue to improve oxygenation, at the time of my examination down to 5 L/min with 02 saturation 92%.  Keep 02 saturation 88% or greater.        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  Continue blood pressure monitoring.   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  Fasting glucose today is 75.   GERD (gastroesophageal reflux disease) Continue with antiacid therapy.   AKI (acute kidney injury) ([place]) Hypokalemia, Hypomagnesemia.   Today K is 3,0 with bicarbonate at 22, renal function with serum cr at 0,81.  Plan to continue diuresis with furosemide, target further negative fluid balance.  Add 40 meq Kcl x2 and follow up renal function in am. Avoid hypotension and nephrotoxic medications.         Subjective:  Patient is feeling better, dyspnea and edema are improving, no chest pain   Physical Exam: Vitals:   07/16/21 0832 07/16/21 0939 07/16/21 1356 07/16/21 1407  BP:    108/68  Pulse:    84  Resp:    19  Temp:    98.4 F (36.9 C)   TempSrc:    Oral  SpO2: 90% (!) 88% 93% 95%  Weight:      Height:       Neurology awake and alert ENT with no pallor or icterus Cardiovascular with S1 and S2 present and rhythmic, no gallops, positive systolic murmur at the right sternal border Positive JVD  Respiratory with rales at bases bilaterally Abdomen not distended No lower extremity edema  Data Reviewed:    Family Communication: no family at the bedside   Disposition: Status is: Inpatient Remains inpatient appropriate because: respiratory failure   Planned Discharge Destination: Home    Author: Tawni Millers, MD 07/16/2021 3:30 PM  For on call review [email or web address].

## 2021-07-16 NOTE — Evaluation (Signed)
Physical Therapy Evaluation Patient Details Name: Jessica Hart MRN: [id number] DOB: [birth year] Today's Date: 07/16/2021  History of Present Illness  81 yo female adm 5/16 with bil LE edema with pulmonary HTN. heart cath 5/17. 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 Pt on 7L at rest with sats 88-92% and required 10L during gait with sats 85-92% and frequent standing rest breaks. Pt educated for breathing technique, energy conservation and need for home pulse ox. Pt with decreased activity tolerance and impaired pulmonary function who will benefit from acute therapy to maximize mobility, safety and independence.         Recommendations for follow up therapy are one component of a multi-disciplinary discharge planning process, led by the attending physician.  Recommendations may be updated based on patient status, additional functional criteria and insurance authorization.  Follow Up Recommendations Home health PT    Assistance Recommended at Discharge Intermittent Supervision/Assistance  Patient can return home with the following  A little help with bathing/dressing/bathroom;A little help with walking and/or transfers;Assistance with Dentist (4 wheels)  Recommendations for Other Services       Functional Status Assessment Patient has had a recent decline in their functional status and demonstrates the ability to make significant improvements in function in a reasonable and predictable amount of time.     Precautions / Restrictions Precautions Precautions: Fall;Other (comment) Precaution Comments: watch sats      Mobility  Bed Mobility Overal bed mobility: Modified Independent             General bed mobility  comments: HOB 20 degrees with rail    Transfers Overall transfer level: Modified independent                 General transfer comment: pt able to stand from bed and BSC without assist    Ambulation/Gait Ambulation/Gait assistance: Min guard Gait Distance (Feet): 80 Feet Assistive device: Rolling walker (2 wheels) Gait Pattern/deviations: Step-through pattern, Decreased stride length   Gait velocity interpretation: 1.31 - 2.62 ft/sec, indicative of limited community ambulator   General Gait Details: pt able to walk in room without AD but requested RW use for hallway. Pt on 10L for gait with sats 85-92% with standing rest breaks x 4 to recover sats in standing with education for breathing technique and energy conservation as well as recommendation for home pulse ox  Stairs            Wheelchair Mobility    Modified Rankin (Stroke Patients Only)       Balance Overall balance assessment: Mild deficits observed, not formally tested                                           Pertinent Vitals/Pain Pain Assessment Pain Assessment: No/denies pain    Home Living Family/patient expects to be discharged to:: Private residence Living Arrangements: Spouse/significant other Available Help at Discharge: Family;Available 24 hours/day Type of Home: House Home Access: Stairs to enter Entrance Stairs-Rails: Psychiatric nurse of Steps: 5   Home Layout: One level Home Equipment: Conservation officer, nature (2 wheels);Cane - single point  Prior Function Prior Level of Function : Independent/Modified Independent                     Hand Dominance        Extremity/Trunk Assessment   Upper Extremity Assessment Upper Extremity Assessment: Overall WFL for tasks assessed    Lower Extremity Assessment Lower Extremity Assessment: Overall WFL for tasks assessed    Cervical / Trunk Assessment Cervical / Trunk Assessment: Normal   Communication   Communication: No difficulties  Cognition Arousal/Alertness: Awake/alert Behavior During Therapy: WFL for tasks assessed/performed Overall Cognitive Status: Within Functional Limits for tasks assessed                                          General Comments      Exercises     Assessment/Plan    PT Assessment Patient needs continued PT services  PT Problem List Decreased strength;Decreased mobility;Decreased activity tolerance;Decreased balance;Decreased knowledge of use of DME;Cardiopulmonary status limiting activity       PT Treatment Interventions Gait training;Balance training;Functional mobility training;Therapeutic activities;Patient/family education;Stair training;DME instruction;Therapeutic exercise    PT Goals (Current goals can be found in the Care Plan section)  Acute Rehab PT Goals Patient Stated Goal: return home and start walking PT Goal Formulation: With patient Time For Goal Achievement: 07/30/21 Potential to Achieve Goals: Good    Frequency Min 3X/week     Co-evaluation               AM-PAC PT "6 Clicks" Mobility  Outcome Measure Help needed turning from your back to your side while in a flat bed without using bedrails?: None Help needed moving from lying on your back to sitting on the side of a flat bed without using bedrails?: None Help needed moving to and from a bed to a chair (including a wheelchair)?: None Help needed standing up from a chair using your arms (e.g., wheelchair or bedside chair)?: A Little Help needed to walk in hospital room?: A Little Help needed climbing 3-5 steps with a railing? : A Lot 6 Click Score: 20    End of Session Equipment Utilized During Treatment: Oxygen Activity Tolerance: Patient tolerated treatment well Patient left: in chair;with call bell/phone within reach;with chair alarm set Nurse Communication: Mobility status PT Visit Diagnosis: Other abnormalities of gait and  mobility (R26.89);Difficulty in walking, not elsewhere classified (R26.2)    Time: 8119-1478 PT Time Calculation (min) (ACUTE ONLY): 30 min   Charges:   PT Evaluation $PT Eval Moderate Complexity: 1 Mod PT Treatments $Gait Training: 8-22 mins        Mersadies Petree P, PT Acute Rehabilitation Services Pager: [phone] Office: [place] 07/16/2021, 9:43 AM

## 2021-07-16 NOTE — Progress Notes (Addendum)
Progress Note  Patient Name: Jessica Hart Date of Encounter: 07/16/2021  Primary Cardiologist: Elouise Munroe, MD   Subjective   Dyspnea improved  Inpatient Medications    Scheduled Meds:  (feeding supplement) PROSource Plus  30 mL Oral BID BM   aspirin EC  81 mg Oral Daily   dapagliflozin propanediol  5 mg Oral Daily   enoxaparin (LOVENOX) injection  40 mg Subcutaneous Q24H   insulin aspart  0-15 Units Subcutaneous TID WC   insulin aspart  0-5 Units Subcutaneous QHS   ipratropium-albuterol  3 mL Nebulization Q6H   metoprolol tartrate  37.5 mg Oral BID   mometasone-formoterol  2 puff Inhalation BID   multivitamin with minerals  1 tablet Oral Daily   pantoprazole  40 mg Oral Daily   sodium chloride flush  3 mL Intravenous Q12H   Continuous Infusions:  PRN Meds: acetaminophen **OR** acetaminophen, ipratropium-albuterol, ondansetron **OR** ondansetron (ZOFRAN) IV   Vital Signs    Vitals:   07/16/21 0744 07/16/21 0811 07/16/21 0832 07/16/21 0939  BP:  117/60    Pulse:  83    Resp:      Temp:      TempSrc:      SpO2: 96% 95% 90% (!) 88%  Weight:      Height:        Intake/Output Summary (Last 24 hours) at 07/16/2021 0952 Last data filed at 07/15/2021 2217 Gross per 24 hour  Intake 240 ml  Output 1450 ml  Net -1210 ml   Filed Weights   07/12/21 1539 07/16/21 0500  Weight: 74.1 kg 74.6 kg    Telemetry    NSR at 80-90/min - Personally Reviewed  ECG    none - Personally Reviewed  Physical Exam   GEN: No acute distress.   Neck: 10 cm JVD Cardiac: RRR, no murmurs, rubs, +S4.  Respiratory: Clear to auscultation bilaterally. GI: Soft, nontender, non-distended  MS: No edema; No deformity. Neuro:  Nonfocal  Psych: Normal affect   Labs    Chemistry Recent Labs  Lab 07/13/21 0300 07/13/21 1649 07/14/21 0302 07/15/21 0210 07/16/21 0140  NA 139   < > 141 141 141  K 3.2*   < > 4.3 3.6 3.0*  CL 110  --  113* 112* 110  CO2 20*  --  22 21*  22  GLUCOSE 90  --  92 85 75  BUN 10  --  _0 CREATININE 0.86  --  1.14* 1.03* 0.81  CALCIUM 8.8*  --  8.8* 8.9 8.7*  PROT 6.9  --   --   --   --   ALBUMIN 3.5  --   --   --   --   AST 28  --   --   --   --   ALT 21  --   --   --   --   ALKPHOS 102  --   --   --   --   BILITOT 1.4*  --   --   --   --   GFRNONAA >60  --  49* 55* >60  ANIONGAP 9  --  _1 < > = values in this interval not displayed.     Hematology Recent Labs  Lab 07/12/21 1546 07/13/21 0300 07/13/21 1649  WBC 9.1 8.5  --   RBC 4.97 4.80  --   HGB 13.8 13.4 14.3  HCT 42.5 41.0 42.0  MCV  85.5 85.4  --   MCH 27.8 27.9  --   MCHC 32.5 32.7  --   RDW 16.7* 16.6*  --   PLT 237 239  --     Cardiac EnzymesNo results for input(s): TROPONINI in the last 168 hours. No results for input(s): TROPIPOC in the last 168 hours.   BNP Recent Labs  Lab 07/11/21 1535 07/12/21 1546  BNP 658.5* 727.3*     DDimer No results for input(s): DDIMER in the last 168 hours.   Radiology    CT Angio Chest Pulmonary Embolism (PE) W or WO Contrast  Result Date: 07/14/2021 CLINICAL DATA:  Pulmonary embolism (PE) suspected, high prob. Acute on chronic hypoxemic respiratory failure. Lower extremity edema. EXAM: CT ANGIOGRAPHY CHEST WITH CONTRAST TECHNIQUE: Multidetector CT imaging of the chest was performed using the standard protocol during bolus administration of intravenous contrast. Multiplanar CT image reconstructions and MIPs were obtained to evaluate the vascular anatomy. RADIATION DOSE REDUCTION: This exam was performed according to the departmental dose-optimization program which includes automated exposure control, adjustment of the mA and/or kV according to patient size and/or use of iterative reconstruction technique. CONTRAST:  44m OMNIPAQUE IOHEXOL 350 MG/ML SOLN COMPARISON:  05/19/2021 FINDINGS: Cardiovascular: There is adequate opacification the pulmonary arterial tree. No intraluminal filling defect identified to  suggest acute pulmonary embolism. The central pulmonary arteries are minimally dilated suggesting changes of pulmonary arterial hypertension. Coronary artery bypass grafting has been performed. There is global cardiomegaly with particular enlargement of the right ventricle and right atrium with reflux of contrast into the hepatic venous system suggesting elevated right heart pressure and at least some degree of right heart failure. No pericardial effusion. Extensive atherosclerotic calcification within the thoracic aorta. No aortic aneurysm. Particularly prominent atherosclerotic calcification noted at the origin of the left common carotid artery, however, the degree of stenosis is not well assessed on this examination due to phase of contrast administration. Mediastinum/Nodes: Visualized thyroid is unremarkable. Borderline mediastinal adenopathy within the prevascular, right hilar, and subcarinal lymph node groups appears stable since prior examination, nonspecific. The esophagus is unremarkable. Small hiatal hernia. Lungs/Pleura: Subtle diffuse ground-glass pulmonary infiltrate, interlobular septal thickening, and trace right pleural effusion is present, new since prior examination in keeping with pulmonary edema and likely reflecting changes of at least mild cardiogenic failure. Superimposed minimal subpleural architectural distortion with traction bronchiolectasis best appreciated within the posterior basal segments of the lower lobes bilaterally is stable since prior examination in keeping with underlying interstitial lung disease. No pneumothorax. Central airways are widely patent. Upper Abdomen: Trace ascites, new since prior examination. No acute abnormality. Musculoskeletal: No acute bone abnormality. Review of the MIP images confirms the above findings. IMPRESSION: No pulmonary embolism. Morphologic changes in keeping with elevated pulmonary and right heart pressure. Findings in keeping with at least mild  cardiogenic failure with mild interstitial and alveolar pulmonary edema, small right pleural effusion, mild ascites, and prominent reflux of contrast into the hepatic venous system. Stable borderline mediastinal adenopathy, nonspecific. This may be reactive in the setting of cardiogenic failure. Stable chronic interstitial lung changes. Status post coronary artery bypass grafting. Aortic atherosclerosis with prominent atherosclerotic calcification involving the arch vasculature. If there is evidence of cerebrovascular insufficiency, CT arteriography may be more helpful for further evaluation. Aortic Atherosclerosis (ICD10-I70.0). Electronically Signed   By: AFidela SalisburyM.D.   On: 07/14/2021 20:06   VAS [place] EXTREM[place] V[place]OUS [place]UPLEX  Result Date: 07/15/2021 UPPER VENOUS STUDY  Patient Name:  GSANDAR KRINKE  Date of Exam:   07/14/2021 Medical Rec #: [id number]            Accession #:    [id number] Date of Birth: 09-Dec-1940            Patient Gender: F Patient Age:   45 years Exam Location:  [facility] Procedure:      VAS [place] UPPER EXTREMITY VENOUS DUPLEX Referring Phys: Vikki Ports --------------------------------------------------------------------------------  Indications: Pain, knotting, and Swelling in right neck status post radial cath. Swelling and pain left neck post line placement and removal. Limitations: Breathing, constant movement. Patient also on 9 liters of oxygen. Comparison Study: No prior study Performing Technologist: Sharion Dove RVS Supporting Technologist: Rogelia Rohrer RVT, RDMS  Examination Guidelines: A complete evaluation includes B-mode imaging, spectral Doppler, color Doppler, and power Doppler as needed of all accessible portions of each vessel. Bilateral testing is considered an integral part of a complete examination. Limited examinations for reoccurring indications may be performed as noted.  Right Findings:  +----------+------------+---------+-----------+----------+-------+ RIGHT     CompressiblePhasicitySpontaneousPropertiesSummary +----------+------------+---------+-----------+----------+-------+ IJV                      Yes       Yes                      +----------+------------+---------+-----------+----------+-------+ Subclavian               Yes       Yes                      +----------+------------+---------+-----------+----------+-------+  Left Findings: +----------+------------+---------+-----------+----------+-------------------+ LEFT      CompressiblePhasicitySpontaneousProperties      Summary       +----------+------------+---------+-----------+----------+-------------------+ IJV           Full       Yes       Yes              Rouleaux flow noted +----------+------------+---------+-----------+----------+-------------------+ Subclavian               Yes       Yes                                  +----------+------------+---------+-----------+----------+-------------------+  Summary:  Right: There is no DVT noted in the right IJV or subclavian vein. 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 Unable to locate the neck of the pseudoaneurysm secondary to breathing and movement.  Left: No DVT noted in the IJV or the subclavian vein.  *See table(s) above for measurements and observations.  Diagnosing physician: Orlie Pollen Electronically signed by Orlie Pollen on 07/15/2021 at 5:13:01 PM.    Final     Cardiac Studies   2D echo reviewed  Patient Profile     81 y.o. 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 Outpatient sleep study indicated. Oral lasix restarted and her creatinine is improved. She will need to be followed up in the CHF clinic to look into the possibility of  vasodilator therapy. Respiratory failure - her sats are in the low 90's on 7 liters. She has oxygen at home and will need to be able to maintain her sats  on 4-5 liters of oxygen before she can be discharged. Continue diuretic and bronchodilator therapy. COPD - continue bronchodilator therapy.  Neck pain - this is improved.  Hypokalemia - she will need repletion     For questions or updates, please contact [place] Please consult [email or web address] for contact info under Cardiology/STEMI.      Signed, Cristopher Peru, MD  07/16/2021, 9:52 AM

## 2021-07-16 NOTE — Evaluation (Signed)
Occupational Therapy Evaluation Patient Details Name: Jessica Hart MRN: [id number] DOB: 1940-07-26 Today's Date: 07/16/2021   History of Present Illness 81 yo female adm 5/16 with bil LE edema with pulmonary HTN. heart cath 5/17. PMhx: HTN, DM, pulmonary HTN, chronic resp fail on 4L O2   Clinical Impression   Pt typically mod I for ADL/mobility. Pt pleasant and cooperative, on 10L for activity, overall min guard for UB and LB ADL, frequent standing rest breaks with cues for PLB. Pt completed transfers, in room mobility with RW, standing grooming at sink, and will benefit from skilled OT in the acute setting to focus on energy conservation and continued build up of activity tolerance followed by [facility] post-acute. Next session bring EC handout.     Recommendations for follow up therapy are one component of a multi-disciplinary discharge planning process, led by the attending physician.  Recommendations may be updated based on patient status, additional functional criteria and insurance authorization.   Follow Up Recommendations  Home health OT    Assistance Recommended at Discharge Intermittent Supervision/Assistance  Patient can return home with the following A little help with walking and/or transfers;Assistance with cooking/housework;A little help with bathing/dressing/bathroom    Functional Status Assessment  Patient has had a recent decline in their functional status and demonstrates the ability to make significant improvements in function in a reasonable and predictable amount of time.  Equipment Recommendations  Tub/shower seat    Recommendations for Other Services PT consult     Precautions / Restrictions Precautions Precautions: Fall;Other (comment) Precaution Comments: watch sats      Mobility Bed Mobility Overal bed mobility: Needs Assistance Bed Mobility: Supine to Sit     Supine to sit: Supervision     General bed mobility comments: for safety     Transfers Overall transfer level: Needs assistance                 General transfer comment: pt able to stand from bed and BSC without assist      Balance Overall balance assessment: Mild deficits observed, not formally tested                                         ADL either performed or assessed with clinical judgement   ADL Overall ADL's : Needs assistance/impaired Eating/Feeding: Independent   Grooming: Supervision/safety;Wash/dry face;Oral care;Standing Grooming Details (indicate cue type and reason): sink level, standing rest breaks Upper Body Bathing: Min guard;Standing   Lower Body Bathing: Min guard;Sit to/from stand   Upper Body Dressing : Set up;Sitting   Lower Body Dressing: Min guard;Sit to/from stand Lower Body Dressing Details (indicate cue type and reason): able to perform figure 4 technique Toilet Transfer: Min guard;Rolling walker (2 wheels)   Toileting- Clothing Manipulation and Hygiene: Min guard;Sit to/from stand       Functional mobility during ADLs: Min guard;Rolling walker (2 wheels) General ADL Comments: decreased activity tolerance, decreased balance from baseline     Vision Baseline Vision/History: 1 Wears glasses Ability to See in Adequate Light: 0 Adequate Patient Visual Report: No change from baseline       Perception     Praxis      Pertinent Vitals/Pain Pain Assessment Pain Assessment: No/denies pain     Hand Dominance     Extremity/Trunk Assessment Upper Extremity Assessment Upper Extremity Assessment: Overall WFL for tasks assessed   Lower  Extremity Assessment Lower Extremity Assessment: Defer to PT evaluation   Cervical / Trunk Assessment Cervical / Trunk Assessment: Normal   Communication Communication Communication: No difficulties   Cognition Arousal/Alertness: Awake/alert Behavior During Therapy: WFL for tasks assessed/performed Overall Cognitive Status: Within Functional Limits for  tasks assessed                                       General Comments  Pt required 10 L O2 during activity to maintain SpO2 >90%, cues for PLB    Exercises     Shoulder Instructions      Home Living Family/patient expects to be discharged to:: Private residence Living Arrangements: Spouse/significant other Available Help at Discharge: Family;Available 24 hours/day Type of Home: House Home Access: Stairs to enter [employer] of Steps: 5 Entrance Stairs-Rails: Right;Left Home Layout: One level     Bathroom Shower/Tub: Teacher, early years/pre: Standard     Home Equipment: Conservation officer, nature (2 wheels);Cane - single point          Prior Functioning/Environment Prior Level of Function : Independent/Modified Independent                        OT Problem List: Decreased activity tolerance;Impaired balance (sitting and/or standing);Decreased knowledge of use of DME or AE;Cardiopulmonary status limiting activity      OT Treatment/Interventions: Self-care/ADL training;Energy conservation;DME and/or AE instruction;Therapeutic activities;Patient/family education;Balance training    OT Goals(Current goals can be found in the care plan section) Acute Rehab OT Goals Patient Stated Goal: decrease O2 needs OT Goal Formulation: With patient Time For Goal Achievement: 07/30/21 Potential to Achieve Goals: Good ADL Goals Pt Will Perform Grooming: with modified independence;standing Pt Will Perform Upper Body Dressing: with modified independence;sitting Pt Will Perform Lower Body Dressing: with modified independence;sit to/from stand Pt Will Transfer to Toilet: with modified independence;ambulating Pt Will Perform Toileting - Clothing Manipulation and hygiene: with modified independence;sit to/from stand Additional ADL Goal #1: Pt will verbalize 3 strategies for energy conservation for ADL with no cues  OT Frequency: Min 2X/week     Co-evaluation              AM-PAC OT "6 Clicks" Daily Activity     Outcome Measure Help from another person eating meals?: None Help from another person taking care of personal grooming?: A Little Help from another person toileting, which includes using toliet, bedpan, or urinal?: A Little Help from another person bathing (including washing, rinsing, drying)?: A Little Help from another person to put on and taking off regular upper body clothing?: A Little Help from another person to put on and taking off regular lower body clothing?: A Little 6 Click Score: 19   End of Session Equipment Utilized During Treatment: Gait belt;Rolling walker (2 wheels);Oxygen (10-7L) Nurse Communication: Mobility status  Activity Tolerance: Patient tolerated treatment well Patient left: with call bell/phone within reach;in bed;with bed alarm set  OT Visit Diagnosis: Other abnormalities of gait and mobility (R26.89);Muscle weakness (generalized) (M62.81);Other (comment) (cardiopulmonary limiting status)                Time: 8372-9021 OT Time Calculation (min): 19 min Charges:  OT General Charges $OT Visit: 1 Visit OT Evaluation $OT Eval Moderate Complexity: Comfort OTR/L Acute Rehabilitation Services Pager: [phone] Office: [place] 07/16/2021, 5:23 PM

## 2021-07-17 DIAGNOSIS — K219 Gastro-esophageal reflux disease without esophagitis: Secondary | ICD-10-CM | POA: Diagnosis not present

## 2021-07-17 DIAGNOSIS — Z951 Presence of aortocoronary bypass graft: Secondary | ICD-10-CM | POA: Diagnosis not present

## 2021-07-17 DIAGNOSIS — I2721 Secondary pulmonary arterial hypertension: Secondary | ICD-10-CM | POA: Diagnosis not present

## 2021-07-17 DIAGNOSIS — J439 Emphysema, unspecified: Secondary | ICD-10-CM | POA: Diagnosis not present

## 2021-07-17 DIAGNOSIS — N179 Acute kidney failure, unspecified: Secondary | ICD-10-CM | POA: Diagnosis not present

## 2021-07-17 LAB — GLUCOSE, CAPILLARY
Glucose-Capillary: 187 mg/dL — ABNORMAL HIGH (ref 70–99)
Glucose-Capillary: 116 mg/dL — ABNORMAL HIGH (ref 70–99)

## 2021-07-17 LAB — BASIC METABOLIC PANEL
Anion gap: 7 (ref 5–15)
BUN: 13 mg/dL (ref 8–23)
CO2: 23 mmol/L (ref 22–32)
Calcium: 8.9 mg/dL (ref 8.9–10.3)
Chloride: 111 mmol/L (ref 98–111)
Creatinine, Ser: 0.89 mg/dL (ref 0.44–1.00)
GFR, Estimated: >60 mL/min (ref >60)
Glucose, Bld: 100 mg/dL — ABNORMAL HIGH (ref 70–99)
Potassium: 4.1 mmol/L (ref 3.5–5.1)
Sodium: 141 mmol/L (ref 135–145)

## 2021-07-17 MED ORDER — FUROSEMIDE 40 MG PO TABS: 60.0000 mg | ORAL_TABLET | Freq: Every day | ORAL | Status: DC

## 2021-07-17 MED ORDER — PROSOURCE PLUS PO LIQD: 30.0000 mL | Freq: Two times a day (BID) | ORAL | 0 refills | Status: DC

## 2021-07-17 MED ORDER — FUROSEMIDE 40 MG PO TABS
40.0000 mg | ORAL_TABLET | Freq: Every day | ORAL | Status: DC
Start: 2021-07-17 — End: 2021-07-17

## 2021-07-17 MED ORDER — ADULT MULTIVITAMIN W/MINERALS CH: 1.0000 | ORAL_TABLET | Freq: Every day | ORAL | 0 refills | Status: AC

## 2021-07-17 MED ORDER — FUROSEMIDE 20 MG PO TABS: 60.0000 mg | ORAL_TABLET | Freq: Every day | ORAL | 0 refills | Status: DC

## 2021-07-17 MED ORDER — POTASSIUM CHLORIDE ER 10 MEQ PO TBCR: 20.0000 meq | EXTENDED_RELEASE_TABLET | Freq: Every day | ORAL | 0 refills | Status: DC

## 2021-07-17 NOTE — Progress Notes (Signed)
Progress Note  Patient Name: Jessica Hart Date of Encounter: 07/17/2021  Primary Cardiologist: Elouise Munroe, MD   Subjective   Dyspnea improved.   Inpatient Medications    Scheduled Meds:  (feeding supplement) PROSource Plus  30 mL Oral BID BM   aspirin EC  81 mg Oral Daily   dapagliflozin propanediol  5 mg Oral Daily   enoxaparin (LOVENOX) injection  40 mg Subcutaneous Q24H   insulin aspart  0-15 Units Subcutaneous TID WC   insulin aspart  0-5 Units Subcutaneous QHS   ipratropium-albuterol  3 mL Nebulization TID   metoprolol tartrate  37.5 mg Oral BID   mometasone-formoterol  2 puff Inhalation BID   multivitamin with minerals  1 tablet Oral Daily   pantoprazole  40 mg Oral Daily   sodium chloride flush  3 mL Intravenous Q12H   Continuous Infusions:  PRN Meds: acetaminophen **OR** acetaminophen, ipratropium-albuterol, ondansetron **OR** ondansetron (ZOFRAN) IV   Vital Signs    Vitals:   07/17/21 0500 07/17/21 0518 07/17/21 0743 07/17/21 0817  BP:  124/82 109/63 109/63  Pulse:  87  92  Resp:  18 17   Temp:  98.2 F (36.8 C) 98.2 F (36.8 C)   TempSrc:  Oral Oral   SpO2: 92% 91%    Weight:      Height:        Intake/Output Summary (Last 24 hours) at 07/17/2021 0943 Last data filed at 07/16/2021 2300 Gross per 24 hour  Intake --  Output 850 ml  Net -850 ml   Filed Weights   07/12/21 1539 07/16/21 0500  Weight: 74.1 kg 74.6 kg    Telemetry    nsr - Personally Reviewed  ECG    none - Personally Reviewed  Physical Exam   GEN: No acute distress.   Neck: No JVD Cardiac: RRR, no murmurs, rubs, or gallops.  Respiratory: Clear to auscultation bilaterally. GI: Soft, nontender, non-distended  MS: No edema; No deformity. Neuro:  Nonfocal  Psych: Normal affect   Labs    Chemistry Recent Labs  Lab 07/13/21 0300 07/13/21 1649 07/15/21 0210 07/16/21 0140 07/17/21 0300  NA 139   < > 141 141 141  K 3.2*   < > 3.6 3.0* 4.1  CL 110   <  > 112* 110 111  CO2 20*   < > 21* 22 23  GLUCOSE 90   < > 85 75 100*  BUN 10   < > _0 CREATININE 0.86   < > 1.03* 0.81 0.89  CALCIUM 8.8*   < > 8.9 8.7* 8.9  PROT 6.9  --   --   --   --   ALBUMIN 3.5  --   --   --   --   AST 28  --   --   --   --   ALT 21  --   --   --   --   ALKPHOS 102  --   --   --   --   BILITOT 1.4*  --   --   --   --   GFRNONAA >60   < > 55* >60 >60  ANIONGAP 9   < > _1 < > = values in this interval not displayed.     Hematology Recent Labs  Lab 07/12/21 1546 07/13/21 0300 07/13/21 1649  WBC 9.1 8.5  --   RBC 4.97 4.80  --  HGB 13.8 13.4 14.3  HCT 42.5 41.0 42.0  MCV 85.5 85.4  --   MCH 27.8 27.9  --   MCHC 32.5 32.7  --   RDW 16.7* 16.6*  --   PLT 237 239  --     Cardiac EnzymesNo results for input(s): TROPONINI in the last 168 hours. No results for input(s): TROPIPOC in the last 168 hours.   BNP Recent Labs  Lab 07/11/21 1535 07/12/21 1546  BNP 658.5* 727.3*     DDimer No results for input(s): DDIMER in the last 168 hours.   Radiology    No results found.  Cardiac Studies   reviewed  Patient Profile     81 y.o. female admitted with sob and pulmonary HTN  Assessment & Plan    Acute on chronic RV failure - her symptoms are improved. She is maintaining sats in the 90's with 4 liters. I strongly encouraged her to wear her oxygen at all times and to take her lasix. She is stable for DC. She will need a followup in our CHF clinic. COPD - she will continue her bronchodilator therapy. She will need to continue home oxygen at 4-5 liters. Hypokalemia - repleted.  [facility] will sign off.   Medication Recommendations:  continue current meds including lasix.  Other recommendations (labs, testing, etc):  bmp in a week. Follow up as an outpatient:  CHF clinic for Right heart failure evaluation and management.   For questions or updates, please contact [place] Please consult [email or web address] for contact info under  Cardiology/STEMI.      Signed, Cristopher Peru, MD  07/17/2021, 9:43 AM

## 2021-07-17 NOTE — Plan of Care (Signed)
  Problem: Activity: Goal: Ability to return to baseline activity level will improve Outcome: Progressing   Problem: Cardiovascular: Goal: Ability to achieve and maintain adequate cardiovascular perfusion will improve Outcome: Progressing Goal: Vascular access site(s) Level 0-1 will be maintained Outcome: Progressing

## 2021-07-17 NOTE — Discharge Summary (Signed)
Physician Discharge Summary   Patient: Jessica Hart MRN: [id number] DOB: 1941-01-12  Admit date:     07/12/2021  Discharge date: 07/17/21  Discharge Physician: Jimmy Picket Nathin Saran   PCP: Carlena Hurl, PA-C   Recommendations at discharge:    Furosemide has been increased to 60 mg daily Continue supplemental 02 per [place] to keep 02 saturation 88% or greater. Follow up renal function and electrolytes in 7 days.  Increase K supplementation to 20 meq per day.  Hold amlodipine to prevent hypotension.   I spoke with patient's husband at the bedside, we talked in detail about patient's condition, plan of care and prognosis and all questions were addressed.   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 [facility] Course: Jessica Hart was admitted to the hospital with the working diagnosis of decompensated heart failure.   81 yo female with the past medical history of pulmonary hypertension, T2DM, dyslipidemia, chronic hypoxemic respiratory failure who presented with lower extremity edema. Reported 3 weeks of worsening symptoms, refractive to medical therapy as outpatient with furosemide. On her initial physical examination blood pressure 115/77, HR 87, RR 20 and 02 saturation 87%, lungs with rales but no wheezing, heart with S1 and S2 present and rhythmic, abdomen not distended and positive lower extremity edema.   Na 142, K 2,6 CL 108, bicarbonate 11, glucose 111 bun 11 cr 1,0 BNP 727  Wbc 9,1 hgb 13,8 plt 237   Chest radiograph with cardiomegaly and bilateral interstitial markings.   EKG 89 bpm, normal axis, normal intervals, sinus rhythm with q wave in lead III and AvF, no significant ST segment changes, negative T in  lead I and AvL.  Patient was placed on IV diuresis with furosemide.   Right heart catheterization confirming severe pulmonary hypertension with pulmonary artery pressure of 65/27, with mean of 44 mmHg.   Patient was placed on furosemide for diuresis.  She had worsening hypoxemia and increase 02 requirements, using high flow nasal cannula up to 14 l/min   CT chest with bilateral ground glass opacities, suggesting pulmonary edema. Negative for pulmonary embolism.   Placed on bronchodilator therapy and inhaled corticosteroids.   With further diuresis her oxygenation has improved, at the time of her discharge she is using 5 L/min per regular nasal cannula with 02 saturation 92 to 95%.     Assessment and Plan: * Pulmonary artery hypertension (HCC) Acute on chronic diastolic heart failure.  Acute on chronic hypoxemic respiratory failure.   Cardiac catheterization with right atrial pressure 23/22 with mean 17, RV 67/21. PA 65/27, mean 44, LV end diastolic pressure 4 (surrogate for PCWP that was not able to obtain) and SVC 02 saturation 61%.  Pulmonary resistance is 10 woods.   CT chest with no pulmonary embolism. Diffuse ground glass pulmonary infiltrate, interlobular septal thickening, and trace right pleural effusion. Traction bronchiectasis, at the lower lobes and signs of interstitial lung disease.   04/2021 High resolution CT chest with septal thickening, scattered areas of mild subpleural reticulation and cylindrical bronchiectasis. Mild emphysema, with no frank honeycombing.   Patient will continue with bronchodilator therapy and air way clearing techniques with incentive spirometer.  Continue with inhaled corticosteroids  Diuresis with furosemide (incrase dose to 60 mg daily)  and dapagliflozin.   Continue with metoprolol and resume losartan Discontinue amlodipine to prevent hypotension.  Recommended to use supplemental 02 per [place] continuously.        S/P CABG x 3 No chest  pain or angina.  Continue blood pressure control.   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   She was placed on insulin sliding scale for glucose cover and monitoring.   GERD (gastroesophageal reflux disease) Continue with antiacid therapy.   AKI (acute kidney injury) ([place]) Hypokalemia, Hypomagnesemia.   Patient responded well to diuresis, at the time of her discharge her renal function has a serum cr of 0,89 with K at 4,1 and serum bicarbonate at 23.  Plan to continue diuresis with furosemide 40 mg daily and added dapagliflozin.          Consultants: cardiology  Procedures performed: right heart cathterization   Disposition: Home Diet recommendation:  Cardiac diet DISCHARGE MEDICATION: Allergies as of 07/17/2021       Reactions   Lisinopril Itching, Cough        Medication List     STOP taking these medications    amLODipine 10 MG tablet Commonly known as: NORVASC       TAKE these medications    (feeding supplement) PROSource Plus liquid Take 30 mLs by mouth 2 (two) times daily between meals.   acetaminophen 500 MG tablet Commonly known as: TYLENOL Take 2 tablets (1,000 mg total) by mouth every 8 (eight) hours as needed. What changed: when to take this   aspirin EC 81 MG tablet Take 1 tablet (81 mg total) by mouth daily. Swallow whole.   clotrimazole-betamethasone cream Commonly known as: LOTRISONE Apply 1 application topically daily.   Creon 36000 UNITS Cpep capsule Generic drug: lipase/protease/amylase TAKE 2 CAPSULES BY MOUTH 3 TIMES A DAY WITH MEALS AND TAKE 1 CAPSULE WITH EACH SNACK TWICE DAILY   D3 5000 125 MCG (5000 UT) capsule Generic drug: Cholecalciferol Take 5,000 Units by mouth daily.   dapagliflozin propanediol 5 MG Tabs tablet Commonly known as: Farxiga Take 1 tablet (5 mg total) by mouth daily.   furosemide 20 MG tablet Commonly known as: LASIX Take 3  tablets (60 mg total) by mouth daily. What changed:  medication strength how much to take   losartan 50 MG tablet Commonly known as: COZAAR Take 1 tablet (50 mg total) by mouth daily.   metFORMIN 750 MG 24 hr tablet Commonly known as: GLUCOPHAGE-XR TAKE 1 TABLET BY MOUTH EVERY DAY WITH BREAKFAST   metoprolol tartrate 25 MG tablet Commonly known as: LOPRESSOR Take 37.5 mg by mouth 2 (two) times daily.   Misc. Devices Misc SIG: 1 L of Oxygen via Nasal cannula around the clock Dispense Home Oxygen and Portable tank and necessary supplies. Dx: Hypoxemia, Shortness of breath What changed: additional instructions   multivitamin with minerals Tabs tablet Take 1 tablet by mouth daily. Start taking on: Jul 18, 2021   omeprazole 40 MG capsule Commonly known as: PRILOSEC TAKE 1 CAPSULE BY MOUTH EVERY DAY What changed:  how much to take how to take this when to take this   OneTouch Delica Lancets 27O Misc USE TO TEST BLOOD SUGAR ONCE A DAY   OneTouch Verio test strip Generic drug: glucose blood USE 1 STRIP TO CHECK GLUCOSE ONCE DAILY   Pen Needles 5/16" 30G X 8 MM Misc Pen needles used for lantus injections   Pen Needles 32G X 4 MM  Misc 1 each by Other route daily.   potassium chloride 10 MEQ tablet Commonly known as: KLOR-CON Take 2 tablets (20 mEq total) by mouth daily. What changed: how much to take   rosuvastatin 20 MG tablet Commonly known as: CRESTOR Take 1 tablet (20 mg total) by mouth daily.   Trelegy Ellipta 100-62.5-25 MCG/ACT Aepb Generic drug: Fluticasone-Umeclidin-Vilant Inhale 1 puff into the lungs daily.   Tyler Aas FlexTouch 100 UNIT/ML FlexTouch Pen Generic drug: insulin degludec Inject 15 Units into the skin daily after supper.   vitamin B-12 500 MCG tablet Commonly known as: CYANOCOBALAMIN Take 500 mcg by mouth daily.        Follow-up Information     Deberah Pelton, NP Follow up on 07/26/2021.   Specialty: Cardiology Why: at 2:45pm for  your follow up in the office with Dr. Hamilton Capri' NP Denyse Amass. Contact information: [address] [place] [address] [facility] Follow up.   Specialty: Cardiology Why: Office will call you for outpatient appt Contact information: [address] [id number] [place] [phone]               Discharge Exam: Danley Danker Weights   07/12/21 1539 07/16/21 0500  Weight: 74.1 kg 74.6 kg   BP 109/63   Pulse 92   Temp 98.2 F (36.8 C) (Oral)   Resp 17   Ht _0  (1.6 m)   Wt 74.6 kg   SpO2 92%   BMI 29.13 kg/m   Patient is feeling better, dyspnea and edema have resolved, patient using supplemental 02 per regular [place] 5 L/min   Neurology awake and alert ENT with mild pallor Cardiovascular with S1 and S2 present and rhythmic, positive systolic murmur at the right sternal border Mild JVD No lower extremity edema Respiratory with no rales or wheezing Abdomen not distended   Condition at discharge: stable  The results of significant diagnostics from this hospitalization (including imaging, microbiology, ancillary and laboratory) are listed below for reference.   Imaging Studies: DG Chest 1 View  Result Date: 07/12/2021 CLINICAL DATA:  Shortness of breath with swelling. EXAM: CHEST  1 VIEW COMPARISON:  02/08/2021. FINDINGS: Chronic interstitial opacities. No confluent consolidation. No visible pleural effusions or pneumothorax. Enlarged cardiac silhouette. Median sternotomy and CABG. Polyarticular degenerative change. IMPRESSION: 1. Chronic interstitial opacities, probably in part due to the interstitial lung disease seen on prior CT chest. Superimposed mild interstitial edema is not excluded. 2. Cardiomegaly. Electronically Signed   By: Margaretha Sheffield M.D.   On: 07/12/2021 16:33   CT Angio Chest Pulmonary Embolism (PE) W or WO Contrast  Result Date:  07/14/2021 CLINICAL DATA:  Pulmonary embolism (PE) suspected, high prob. Acute on chronic hypoxemic respiratory failure. Lower extremity edema. EXAM: CT ANGIOGRAPHY CHEST WITH CONTRAST TECHNIQUE: Multidetector CT imaging of the chest was performed using the standard protocol during bolus administration of intravenous contrast. Multiplanar CT image reconstructions and MIPs were obtained to evaluate the vascular anatomy. RADIATION DOSE REDUCTION: This exam was performed according to the departmental dose-optimization program which includes automated exposure control, adjustment of the mA and/or kV according to patient size and/or use of iterative reconstruction technique. CONTRAST:  42m OMNIPAQUE IOHEXOL 350 MG/ML SOLN COMPARISON:  05/19/2021 FINDINGS: Cardiovascular: There is adequate opacification the pulmonary arterial tree. No intraluminal filling defect identified to suggest acute pulmonary embolism. The central pulmonary arteries are minimally dilated suggesting changes of  pulmonary arterial hypertension. Coronary artery bypass grafting has been performed. There is global cardiomegaly with particular enlargement of the right ventricle and right atrium with reflux of contrast into the hepatic venous system suggesting elevated right heart pressure and at least some degree of right heart failure. No pericardial effusion. Extensive atherosclerotic calcification within the thoracic aorta. No aortic aneurysm. Particularly prominent atherosclerotic calcification noted at the origin of the left common carotid artery, however, the degree of stenosis is not well assessed on this examination due to phase of contrast administration. Mediastinum/Nodes: Visualized thyroid is unremarkable. Borderline mediastinal adenopathy within the prevascular, right hilar, and subcarinal lymph node groups appears stable since prior examination, nonspecific. The esophagus is unremarkable. Small hiatal hernia. Lungs/Pleura: Subtle diffuse  ground-glass pulmonary infiltrate, interlobular septal thickening, and trace right pleural effusion is present, new since prior examination in keeping with pulmonary edema and likely reflecting changes of at least mild cardiogenic failure. Superimposed minimal subpleural architectural distortion with traction bronchiolectasis best appreciated within the posterior basal segments of the lower lobes bilaterally is stable since prior examination in keeping with underlying interstitial lung disease. No pneumothorax. Central airways are widely patent. Upper Abdomen: Trace ascites, new since prior examination. No acute abnormality. Musculoskeletal: No acute bone abnormality. Review of the MIP images confirms the above findings. IMPRESSION: No pulmonary embolism. Morphologic changes in keeping with elevated pulmonary and right heart pressure. Findings in keeping with at least mild cardiogenic failure with mild interstitial and alveolar pulmonary edema, small right pleural effusion, mild ascites, and prominent reflux of contrast into the hepatic venous system. Stable borderline mediastinal adenopathy, nonspecific. This may be reactive in the setting of cardiogenic failure. Stable chronic interstitial lung changes. Status post coronary artery bypass grafting. Aortic atherosclerosis with prominent atherosclerotic calcification involving the arch vasculature. If there is evidence of cerebrovascular insufficiency, CT arteriography may be more helpful for further evaluation. Aortic Atherosclerosis (ICD10-I70.0). Electronically Signed   By: Fidela Salisbury M.D.   On: 07/14/2021 20:06   CARDIAC CATHETERIZATION  Result Date: 07/13/2021 RHC: Right atrial pressure 23/22 mmHg ith a mean of 17 mmHg Right ventricular pressure of 67/-2 mmHg with a right ventricular end-diastolic pressure of 21 mmHg Pulmonary artery pressure of 65/27 mmHg with a mean pulmonary artery pressure 44 mmHg Left ventricular end-diastolic pressure of 4 mmHg SVC  oxygen saturation of 61% Pulmonary artery oxygen saturation 59% Arterial oxygen saturation of 92% Fick cardiac output of 4.18 L/min Fick cardiac index of 2.35 L/min If the left ventricular end-diastolic pressure is taken as surrogate for the the left atrial pressure the pulmonary vascular resistance is approximately 10 Woods units (see technique section for further details as wedge pressure could not be obtained). Conclusion:  Right heart catheterization with pulmonary arterial hypertension with a mean pulmonary artery pressure of 44 mmHg and a pulmonary vascular resistance of approximately 10 Wood units.  VAS [place] UPPER EXTREMITY VENOUS DUPLEX  Result Date: 07/15/2021 UPPER VENOUS STUDY  Patient Name:  Jessica Hart  Date of Exam:   07/14/2021 Medical Rec #: [id number]            Accession #:    [id number] Date of Birth: January 29, 1941            Patient Gender: F Patient Age:   16 years Exam Location:  [facility] Procedure:      VAS [place] UPPER EXTREMITY VENOUS DUPLEX Referring Phys: Vikki Ports --------------------------------------------------------------------------------  Indications: Pain, knotting, and Swelling in right neck status post radial cath. Swelling and  pain left neck post line placement and removal. Limitations: Breathing, constant movement. Patient also on 9 liters of oxygen. Comparison Study: No prior study Performing Technologist: Sharion Dove RVS Supporting Technologist: Rogelia Rohrer RVT, RDMS  Examination Guidelines: A complete evaluation includes B-mode imaging, spectral Doppler, color Doppler, and power Doppler as needed of all accessible portions of each vessel. Bilateral testing is considered an integral part of a complete examination. Limited examinations for reoccurring indications may be performed as noted.  Right Findings: +----------+------------+---------+-----------+----------+-------+ RIGHT     CompressiblePhasicitySpontaneousPropertiesSummary  +----------+------------+---------+-----------+----------+-------+ IJV                      Yes       Yes                      +----------+------------+---------+-----------+----------+-------+ Subclavian               Yes       Yes                      +----------+------------+---------+-----------+----------+-------+  Left Findings: +----------+------------+---------+-----------+----------+-------------------+ LEFT      CompressiblePhasicitySpontaneousProperties      Summary       +----------+------------+---------+-----------+----------+-------------------+ IJV           Full       Yes       Yes              Rouleaux flow noted +----------+------------+---------+-----------+----------+-------------------+ Subclavian               Yes       Yes                                  +----------+------------+---------+-----------+----------+-------------------+  Summary:  Right: There is no DVT noted in the right IJV or subclavian vein. 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 Unable to locate the neck of the pseudoaneurysm secondary to breathing and movement.  Left: No DVT noted in the IJV or the subclavian vein.  *See table(s) above for measurements and observations.  Diagnosing physician: Orlie Pollen Electronically signed by Orlie Pollen on 07/15/2021 at 5:13:01 PM.    Final     Microbiology: Results for orders placed or performed during the hospital encounter of 08/29/19  Surgical pcr screen     Status: None   Collection Time: 08/29/19 12:12 PM   Specimen: Nasal Mucosa; Nasal Swab  Result Value Ref Range Status   MRSA, PCR NEGATIVE NEGATIVE Final   Staphylococcus aureus NEGATIVE NEGATIVE Final    Comment: (NOTE) The Xpert SA Assay (FDA approved for NASAL specimens in patients 71 years of age and older), is one component of a comprehensive surveillance program. It is not  intended to diagnose infection nor to guide or monitor treatment. Performed at [facility], [place] [address], [place], [place] [address]     Labs: CBC: Recent Labs  Lab 07/12/21 1546 07/13/21 0300 07/13/21 1649  WBC 9.1 8.5  --   NEUTROABS 6.9 6.2  --   HGB 13.8 13.4 14.3  HCT 42.5 41.0 42.0  MCV 85.5 85.4  --   PLT 237 239  --    Basic Metabolic Panel: Recent Labs  Lab 07/13/21 0300 07/13/21 1649 07/14/21 0302 07/15/21 0210 07/16/21 0140 07/17/21 0300  NA 139 144  141 141 141 141  K 3.2* 4.9 4.3 3.6 3.0* 4.1  CL 110  --  113* 112* 110 111  CO2 20*  --  22 21* 22 23  GLUCOSE 90  --  92 85 75 100*  BUN 10  --  _0 CREATININE 0.86  --  1.14* 1.03* 0.81 0.89  CALCIUM 8.8*  --  8.8* 8.9 8.7* 8.9  MG 1.9  --   --   --   --   --    Liver Function Tests: Recent Labs  Lab 07/13/21 0300  AST 28  ALT 21  ALKPHOS 102  BILITOT 1.4*  PROT 6.9  ALBUMIN 3.5   CBG: Recent Labs  Lab 07/16/21 1149 07/16/21 1547 07/16/21 2103 07/17/21 0753 07/17/21 1138  GLUCAP 197* 110* 100* 187* 116*    Discharge time spent: greater than 30 minutes.  Signed: Tawni Millers, MD [facility] 07/17/2021

## 2021-07-17 NOTE — TOC Transition Note (Signed)
Transition of Care [facility]) - CM/SW Discharge Note   Patient Details  Name: GRACIELA PLATO MRN: [id number] Date of Birth: 1940-03-17  [facility] of [facility] CM/SW Contact:  Gayla Medicus., RN Phone Number: 07/17/2021, 1:32 PM  Clinical Narrative:     RNCM received [place] orders for PT and OT.    RNCM spoke to patient and patient with no preference so RNCM contacted Amy with [facility] and confirmation received for services.  RNCM also contacted Jermaine at [facility] at patient request as she would like to have her portable Oxygen checked.   Per Brenton Grills,  patient given customer service number, [phone] will call and schedule time.   Barriers to Discharge: Continued Medical Work up  Patient Goals and CMS Choice       Discharge Placement                      Discharge Plan and Services   Discharge Planning Services: CM Consult                                Social Determinants of Health (SDOH) Interventions    Readmission Risk Interventions     View : No data to display.

## 2021-07-18 LAB — POCT I-STAT EG7
Acid-base deficit: 3 mmol/L — ABNORMAL HIGH (ref 0.0–2.0)
Bicarbonate: 22.5 mmol/L (ref 20.0–28.0)
Calcium, Ion: 1.22 mmol/L (ref 1.15–1.40)
HCT: 42 % (ref 36.0–46.0)
Hemoglobin: 14.3 g/dL (ref 12.0–15.0)
O2 Saturation: 59 %
Potassium: 5 mmol/L (ref 3.5–5.1)
Sodium: 144 mmol/L (ref 135–145)
TCO2: 24 mmol/L (ref 22–32)
pCO2, Ven: 39.1 mmHg — ABNORMAL LOW (ref 44–60)
pH, Ven: 7.367 (ref 7.25–7.43)
pO2, Ven: 32 mmHg (ref 32–45)

## 2021-07-18 LAB — POCT I-STAT 7, (LYTES, BLD GAS, ICA,H+H)
Acid-base deficit: 4 mmol/L — ABNORMAL HIGH (ref 0.0–2.0)
Bicarbonate: 19.8 mmol/L — ABNORMAL LOW (ref 20.0–28.0)
Calcium, Ion: 1.13 mmol/L — ABNORMAL LOW (ref 1.15–1.40)
HCT: 40 % (ref 36.0–46.0)
Hemoglobin: 13.6 g/dL (ref 12.0–15.0)
O2 Saturation: 92 %
Potassium: 4.8 mmol/L (ref 3.5–5.1)
Sodium: 145 mmol/L (ref 135–145)
TCO2: 21 mmol/L — ABNORMAL LOW (ref 22–32)
pCO2 arterial: 31.1 mmHg — ABNORMAL LOW (ref 32–48)
pH, Arterial: 7.411 (ref 7.35–7.45)
pO2, Arterial: 63 mmHg — ABNORMAL LOW (ref 83–108)

## 2021-07-19 ENCOUNTER — Telehealth: Payer: Self-pay

## 2021-07-19 IMAGING — CR DG CHEST 2V
2 series · 2 of 2 positions shown · non-contrast
Comparison: CT 02/04/2019, radiograph 11/07/2006

CLINICAL DATA: Preoperative for CABG, shortness of breath and chest
pain

EXAM:
CHEST - 2 VIEW

[w chest pa]
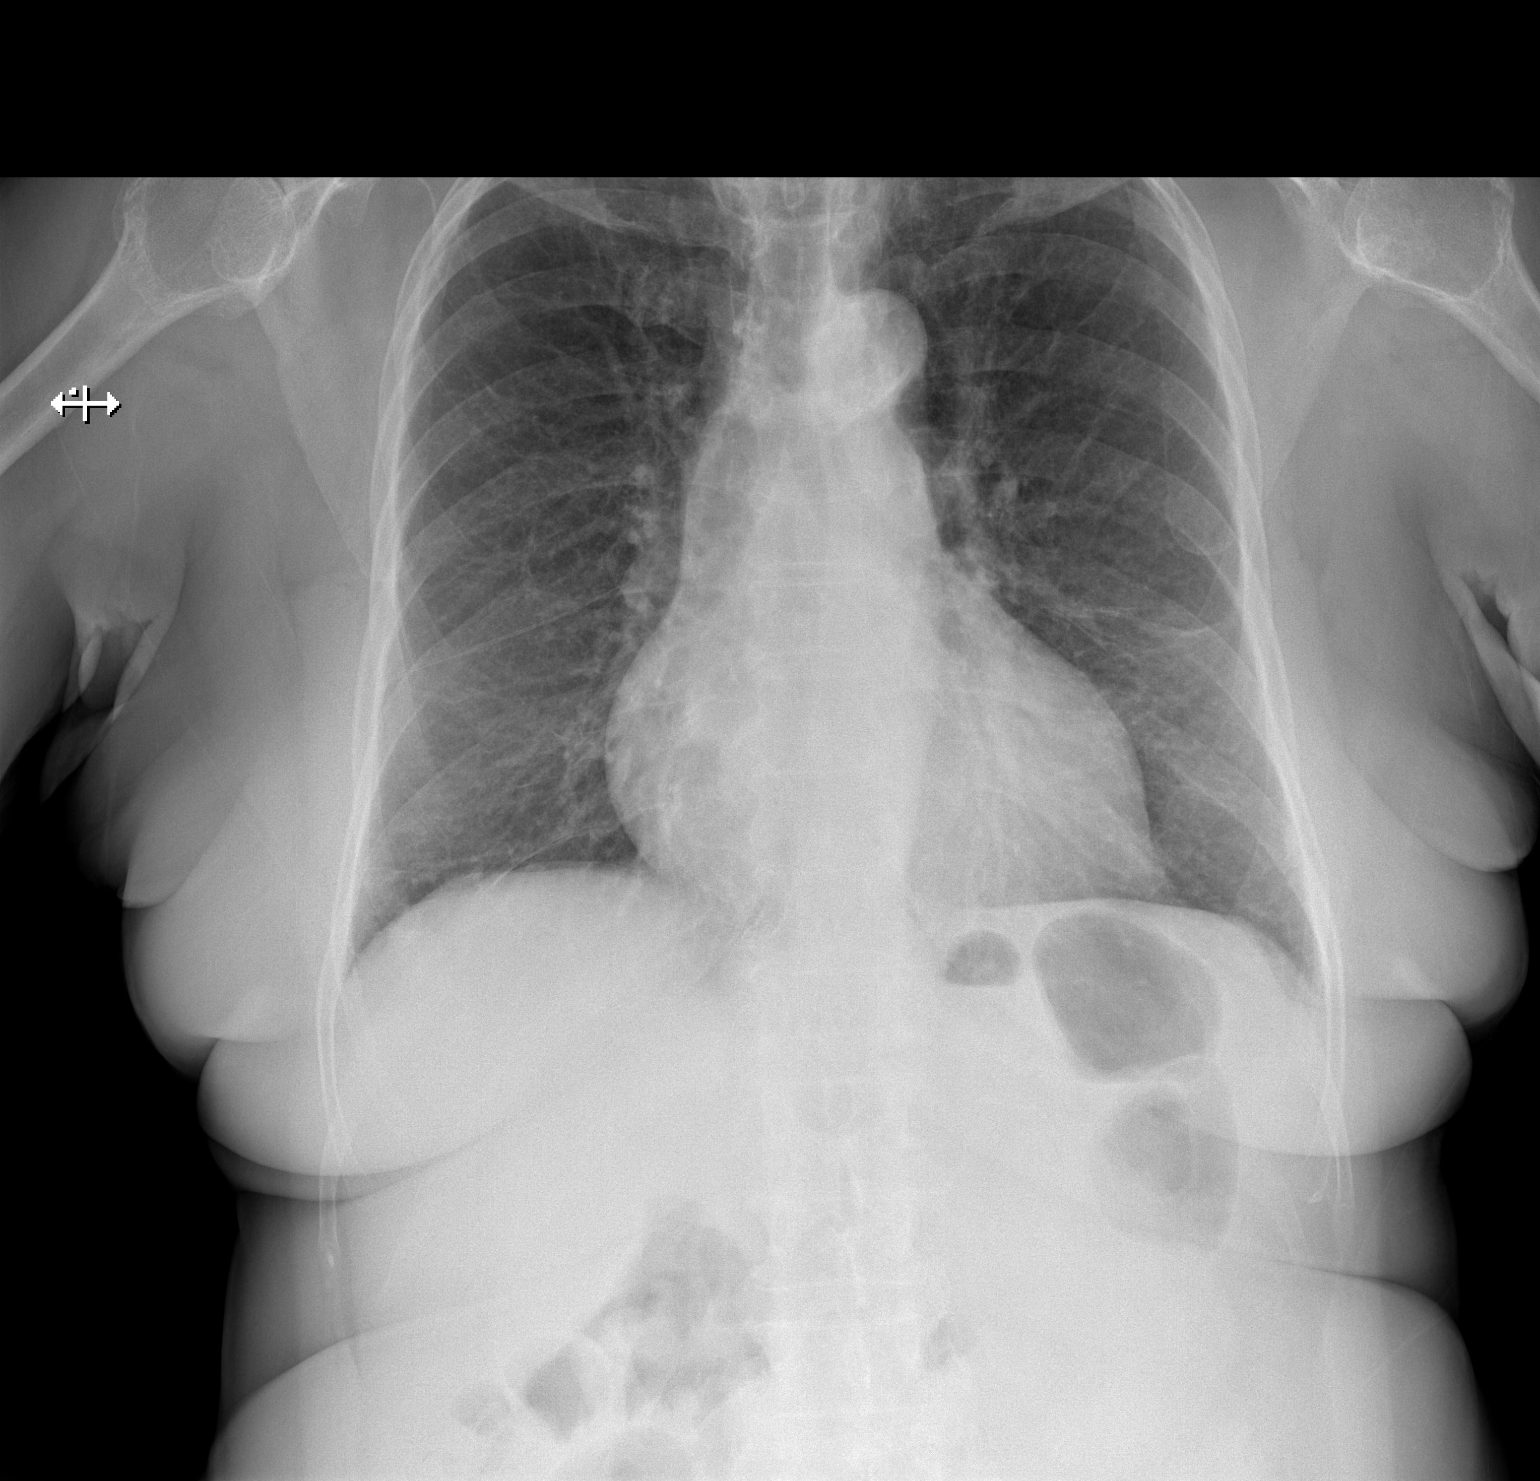

[w chest lat]
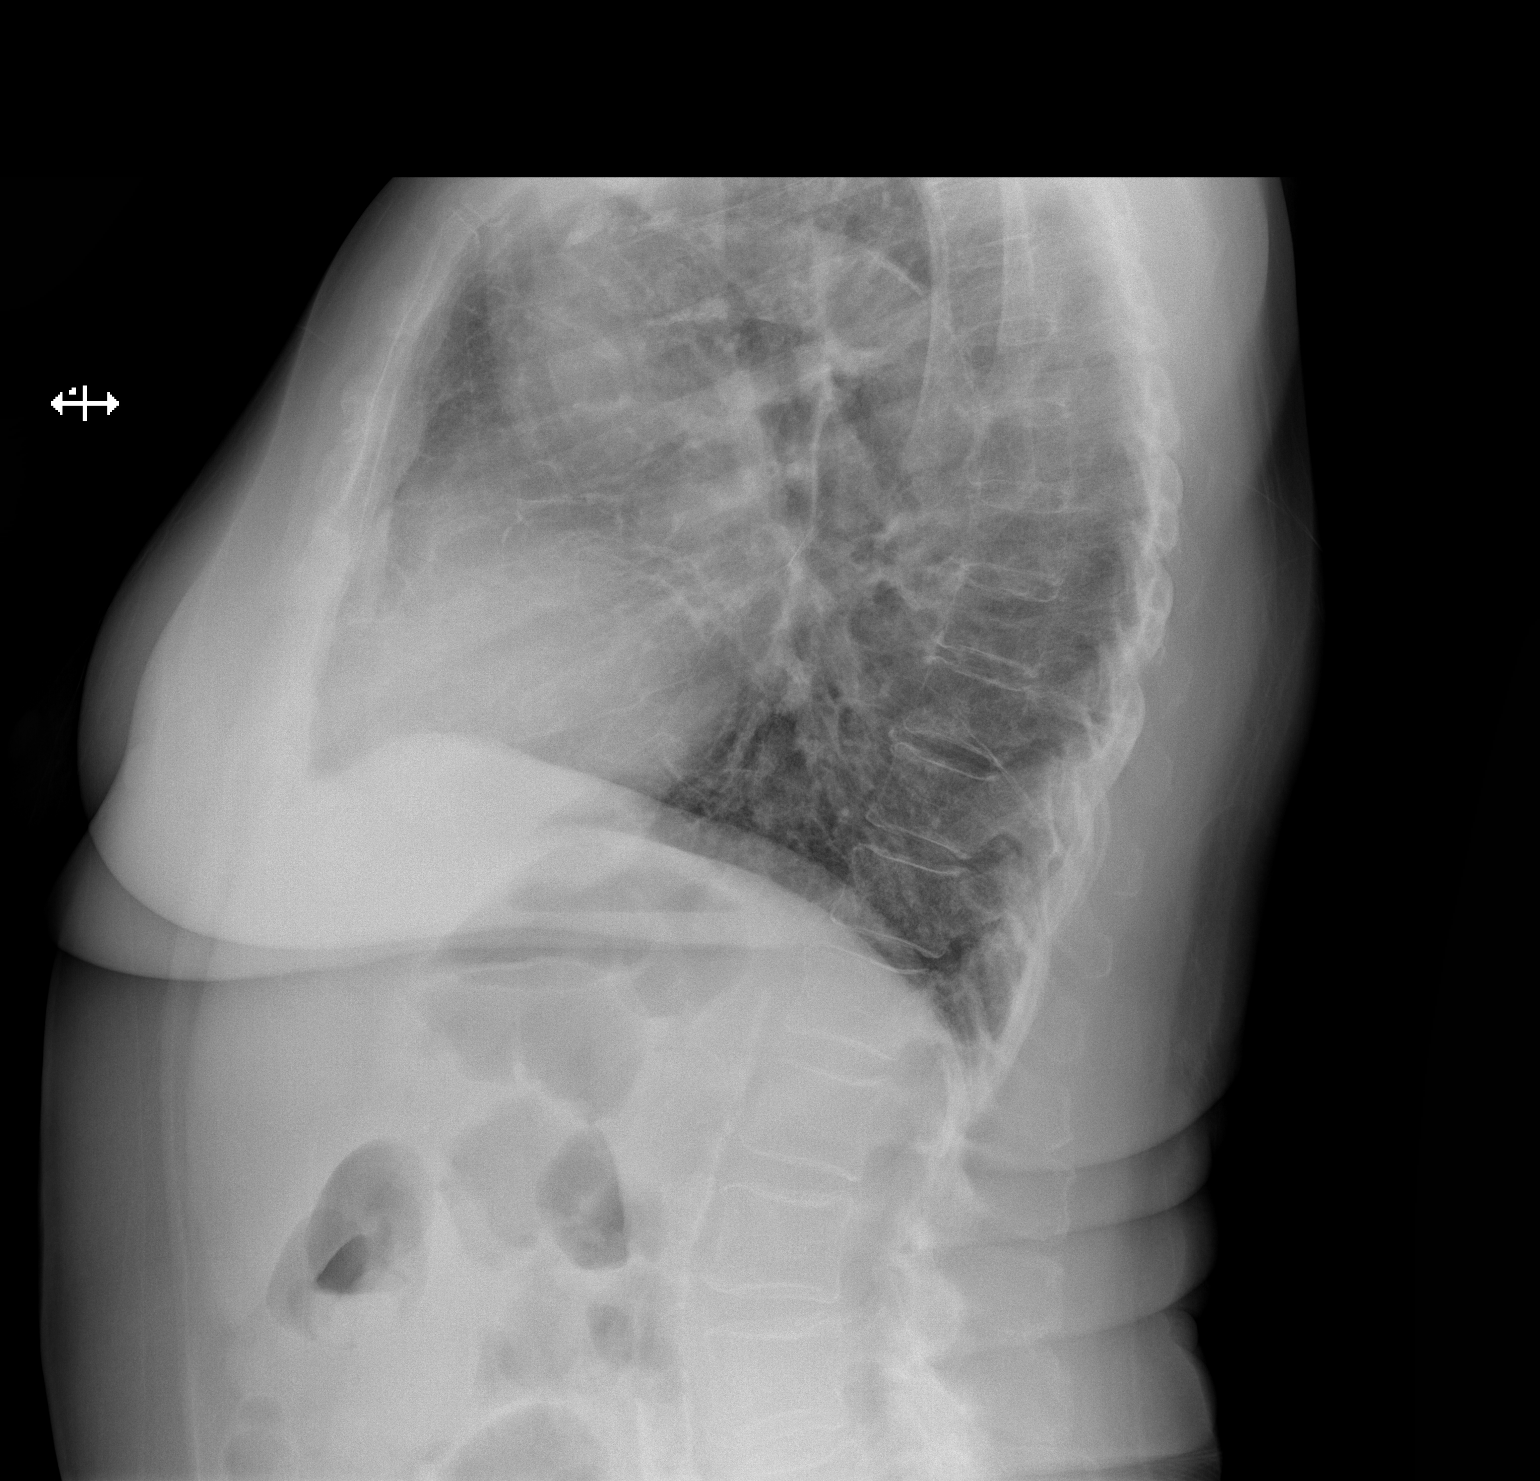

[2 of 2 positions shown; findings below may reference images not displayed]

FINDINGS: Chronic hyperinflation with coarsened interstitium similar to
comparison CTs and radiography. Stable areas of opacity in the left
mid lung likely reflect scarring in the lingula. No consolidation,
features of edema, pneumothorax, or effusion. The aorta is
calcified. The remaining cardiomediastinal contours are
unremarkable. No acute osseous or soft tissue abnormality.
Degenerative changes are present in the imaged spine and shoulders.
IMPRESSION: Chronic hyperinflation and coarsened interstitium with scarring in
the left mid lung/lingula.

No acute cardiopulmonary findings.

## 2021-07-19 NOTE — Telephone Encounter (Signed)
Almira from [facility] called for a verbal order for PT twice a week for 1 week, once a week for 1 week, twice a week for 2 weeks, and then once a week for 2 weeks. She can be reached at [phone].

## 2021-07-25 IMAGING — DX DG CHEST 1V PORT
1 series · 1 of 1 positions shown · non-contrast
Comparison: 09/03/2019.

CLINICAL DATA: Pleural effusion.

EXAM:
PORTABLE CHEST 1 VIEW

[chest ap]
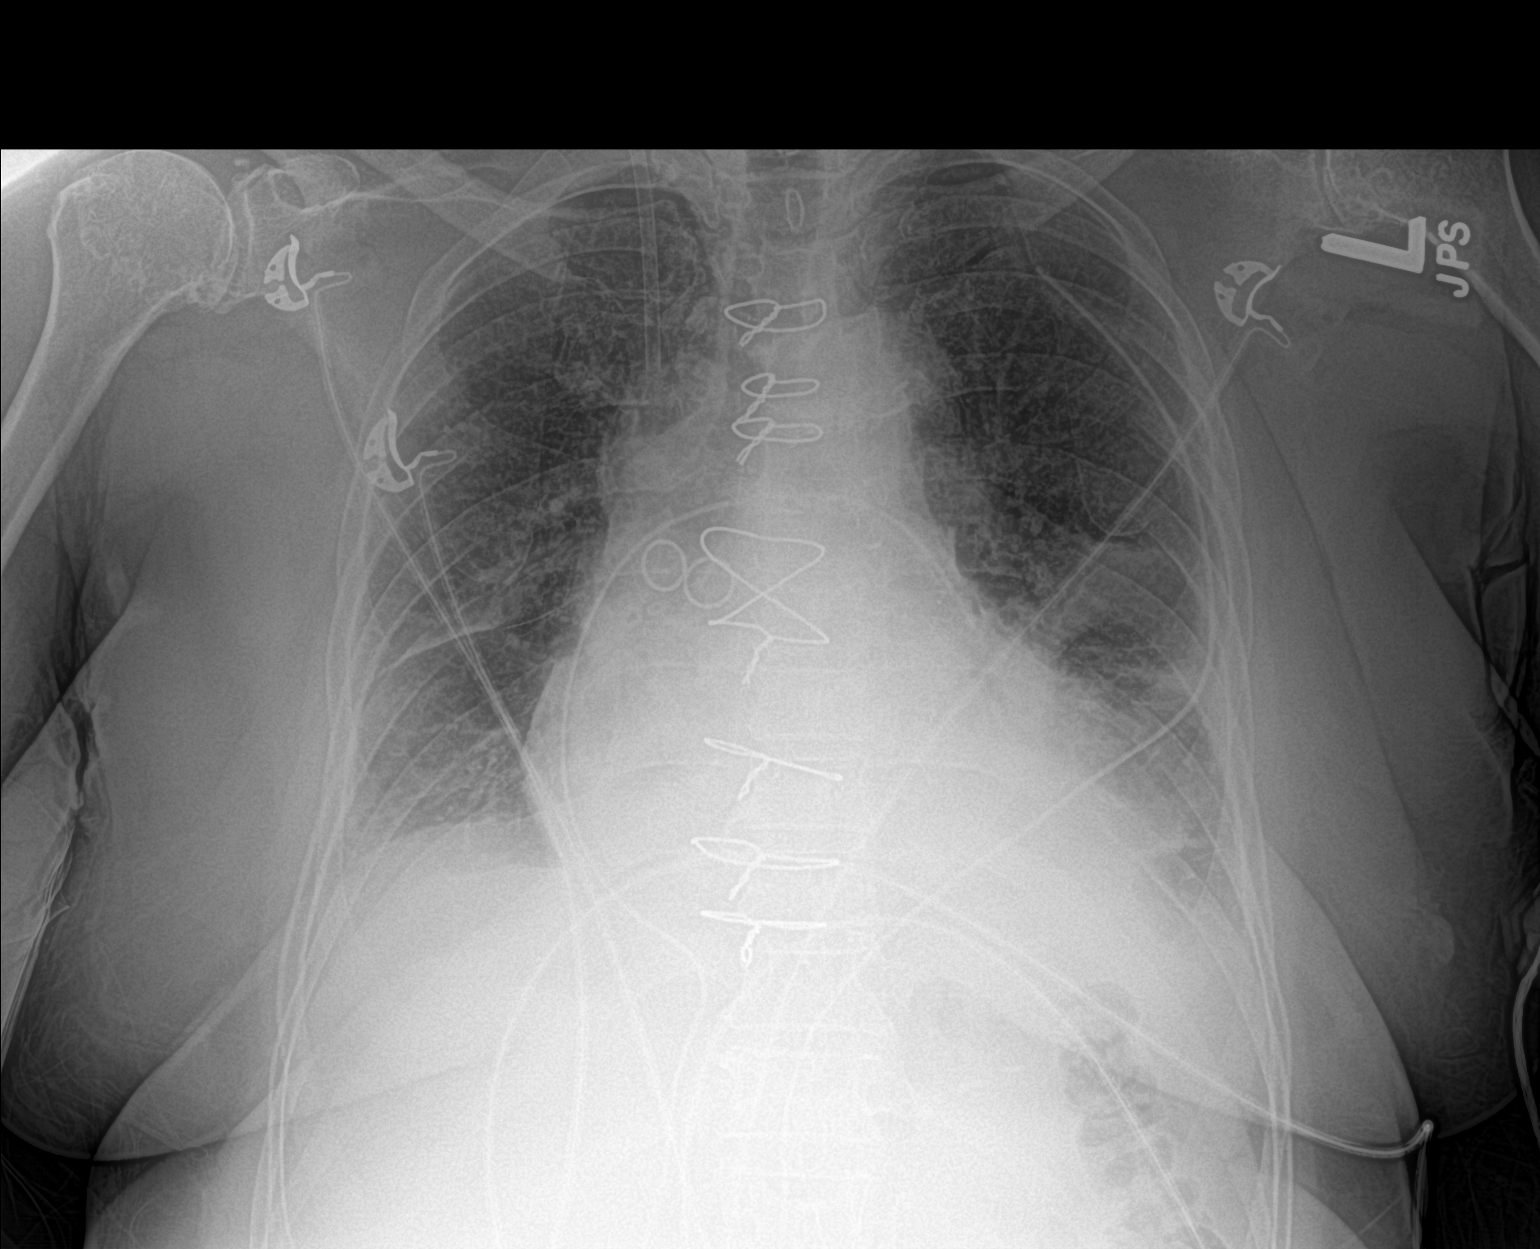

[1 of 1 positions shown; findings below may reference images not displayed]

FINDINGS: Interval removal of Swan-Ganz catheter. Right IJ sheath in stable
position. Mediastinal drainage catheter left chest tube in stable
position. No pneumothorax. Prior CABG. Stable cardiomegaly. Increase
interstitial prominence. Interstitial edema cannot be excluded.
Pneumonitis can not be excluded. Persistent left mid and left base
subsegmental atelectasis/infiltrates. No prominent pleural effusion.
IMPRESSION: 1. Interim removal Swan-Ganz catheter. Remaining lines and tubes
including left chest tube in stable position. No pneumothorax.

2. Prior CABG. Stable cardiomegaly. Increase interstitial prominence
noted bilaterally suggesting mild interstitial edema.

3. Persistent left mid and left base subsegmental
atelectasis/infiltrates.

## 2021-07-25 NOTE — Progress Notes (Unsigned)
Cardiology Clinic Note   Patient Name: Jessica Hart Date of Encounter: 07/26/2021  Primary Care Provider:  Carlena Hurl, PA-C Primary Cardiologist:  Elouise Munroe, MD  Patient Profile    Jessica Hart 81 year old female presents to the clinic today for follow-up evaluation of her coronary artery disease and pulmonary hypertension.  Past Medical History    Past Medical History:  Diagnosis Date   Arthritis    Cataract bilaterl 3 years ago   Coronary artery disease    Diabetes mellitus 02/27/2005   Diabetes mellitus type 2, controlled, without complications ([place]) 0/81/4481   Qualifier: Diagnosis of  By: Drucie Ip     Diverticulosis    Dyspnea    Echocardiogram findings abnormal, without diagnosis 2005   EF 47%, no ischemia, no infarction   Former smoker 01/10/2017   45 pack year history, quit in 2007    GERD (gastroesophageal reflux disease)    Heart murmur    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   History of bone density study 2006   Lt hip = -1.0, L spine = -1.8    Hyperlipidemia    Hypertension    Past Surgical History:  Procedure Laterality Date   ABDOMINAL HYSTERECTOMY  1989   one ovary remains per patient   CARDIAC CATHETERIZATION  08/20/2019   COLONOSCOPY     CORONARY ARTERY BYPASS GRAFT N/A 09/02/2019   Procedure: CORONARY ARTERY BYPASS GRAFTING (CABG) TIMES THREE USING LEFT INTERNAL MAMMARY ARTERY AND RIGHT GREATER SAPHENOUS VEIN;  Surgeon: Lajuana Matte, MD;  Location: [place];  Service: Open Heart Surgery;  Laterality: N/A;   ENDOVEIN HARVEST OF GREATER SAPHENOUS VEIN Right 09/02/2019   Procedure: ENDOVEIN HARVEST OF GREATER SAPHENOUS VEIN;  Surgeon: Lajuana Matte, MD;  Location: [place];  Service: Open Heart Surgery;  Laterality: Right;   EYE SURGERY Bilateral    CATARACT   LEFT HEART CATH AND CORONARY ANGIOGRAPHY N/A 08/20/2019   Procedure: LEFT HEART CATH AND CORONARY ANGIOGRAPHY;  Surgeon: Belva Crome, MD;  Location: [place]  [facility];  Service: Cardiovascular;  Laterality: N/A;   RIGHT AND LEFT HEART CATH N/A 07/13/2021   Procedure: RIGHT AND LEFT HEART CATH;  Surgeon: Early Osmond, MD;  Location: [place] [facility];  Service: Cardiovascular;  Laterality: N/A;   TEE WITHOUT CARDIOVERSION N/A 09/02/2019   Procedure: TRANSESOPHAGEAL ECHOCARDIOGRAM (TEE);  Surgeon: Lajuana Matte, MD;  Location: [place];  Service: Open Heart Surgery;  Laterality: N/A;   TONSILECTOMY, ADENOIDECTOMY, BILATERAL MYRINGOTOMY AND TUBES  1965   TONSILLECTOMY      Allergies  Allergies  Allergen Reactions   Lisinopril Itching and Cough    History of Present Illness    Jessica Hart has a PMH of hypertension, HLD, pulmonary hypertension, emphysema, GERD, coronary artery disease status post CABG x3 (LIMA-LAD, SVG-OM, SVG-PDA 7/21), shortness of breath, obesity, AKI and is a former smoker.  She was admitted to the hospital on 07/13/2021 and discharged on 07/17/2021.  She reported 3 weeks of worsening lower extremity swelling and shortness of breath.  She did not note improvement with increased furosemide dosing.  On exam her abdomen was not distended and she was noted to have lower extremity swelling.  Her blood pressure was 115/77 with a heart rate of 87, oxygen saturation 87%.  She received IV diuresis with furosemide.  She underwent cardiac catheterization which showed severe pulmonary hypertension.  She required increased O2 via high flow nasal cannula up to 14  L/min.  Chest CT showed bilateral groundglass opacities representative of pulmonary edema.  She was negative for pulmonary embolus.  She was placed on bronchodilators and inhaled corticosteroids.  Her oxygen requirements decreased to 5 L/min nasal cannula.  Her furosemide was increased to 60 mg daily and she was continued on Farxiga.  Her amlodipine was discontinued due to low blood pressure.  She presents to the clinic today for follow-up evaluation and states she feels well.   She continues to be on only 5 L of oxygen.  Her weight has significantly improved and she is 153 pounds today.  She has not been weighing herself daily.  She has been avoiding salt.  She reports compliance with her medications.  We reviewed her recent hospitalization and cardiac catheterization.  She expressed understanding.  I will continue her current medication regimen, have her increase her physical activity as tolerated, give her the salty 6 diet sheet, give her a weight log, and plan follow-up as scheduled.  Today she denies chest pain, shortness of breath, lower extremity edema, fatigue, palpitations, melena, hematuria, hemoptysis, diaphoresis, weakness, presyncope, syncope, orthopnea, and PND.   Home Medications    Prior to Admission medications   Medication Sig Start Date End Date Taking? Authorizing Provider  acetaminophen (TYLENOL) 500 MG tablet Take 2 tablets (1,000 mg total) by mouth every 8 (eight) hours as needed. Patient taking differently: Take 1,000 mg by mouth 2 (two) times daily. 09/07/19   Nani Skillern, PA-C  aspirin EC 81 MG tablet Take 1 tablet (81 mg total) by mouth daily. Swallow whole. 02/09/21   Tysinger, Camelia Eng, PA-C  Cholecalciferol (D3 5000) 125 MCG (5000 UT) capsule Take 5,000 Units by mouth daily.    [provider]  clotrimazole-betamethasone (LOTRISONE) cream Apply 1 application topically daily. 02/08/21   Tysinger, Camelia Eng, PA-C  CREON [phone] units CPEP capsule TAKE 2 CAPSULES BY MOUTH 3 TIMES A DAY WITH MEALS AND TAKE 1 CAPSULE WITH EACH SNACK TWICE DAILY 06/06/21   Mauri Pole, MD  dapagliflozin propanediol (FARXIGA) 5 MG TABS tablet Take 1 tablet (5 mg total) by mouth daily. 02/09/21   Tysinger, Camelia Eng, PA-C  Fluticasone-Umeclidin-Vilant (TRELEGY ELLIPTA) 100-62.5-25 MCG/ACT AEPB Inhale 1 puff into the lungs daily. 02/08/21   Tysinger, Camelia Eng, PA-C  furosemide (LASIX) 20 MG tablet Take 3 tablets (60 mg total) by mouth daily.  07/17/21 08/16/21  Arrien, Jimmy Picket, MD  insulin degludec (TRESIBA FLEXTOUCH) 100 UNIT/ML FlexTouch Pen Inject 15 Units into the skin daily after supper. 07/12/21   Tysinger, Camelia Eng, PA-C  Insulin Pen Needle (PEN NEEDLES 5/16") 30G X 8 MM MISC Pen needles used for lantus injections 05/04/15   Henson, Vickie L, NP-C  Insulin Pen Needle (PEN NEEDLES) 32G X 4 MM MISC 1 each by Other route daily. 02/09/21   Tysinger, Camelia Eng, PA-C  losartan (COZAAR) 50 MG tablet Take 1 tablet (50 mg total) by mouth daily. 02/02/21   Tysinger, Camelia Eng, PA-C  metFORMIN (GLUCOPHAGE-XR) 750 MG 24 hr tablet TAKE 1 TABLET BY MOUTH EVERY DAY WITH BREAKFAST 04/19/21   Tysinger, Camelia Eng, PA-C  metoprolol tartrate (LOPRESSOR) 25 MG tablet Take 37.5 mg by mouth 2 (two) times daily.    [provider]  Misc. Devices MISC SIG: 1 L of Oxygen via Nasal cannula around the clock Dispense Home Oxygen and Portable tank and necessary supplies. Dx: Hypoxemia, Shortness of breath Patient taking differently: SIG: 3.5 L of Oxygen via Nasal cannula around the  clock Dispense Home Oxygen and Portable tank and necessary supplies. Dx: Hypoxemia, Shortness of breath 02/09/21   Tysinger, Camelia Eng, PA-C  Multiple Vitamin (MULTIVITAMIN WITH MINERALS) TABS tablet Take 1 tablet by mouth daily. 07/18/21 08/17/21  Arrien, Jimmy Picket, MD  Nutritional Supplements (,FEEDING SUPPLEMENT, PROSOURCE PLUS) liquid Take 30 mLs by mouth 2 (two) times daily between meals. 07/17/21 08/16/21  Arrien, Jimmy Picket, MD  omeprazole (PRILOSEC) 40 MG capsule TAKE 1 CAPSULE BY MOUTH EVERY DAY Patient taking differently: Take 40 mg by mouth daily. TAKE 1 CAPSULE BY MOUTH EVERY DAY 04/19/21   Tysinger, Camelia Eng, PA-C  OneTouch Delica Lancets 76K MISC USE TO TEST BLOOD SUGAR ONCE A DAY 12/26/19   Henson, Vickie L, NP-C  ONETOUCH VERIO test strip USE 1 STRIP TO CHECK GLUCOSE ONCE DAILY 09/28/20   Henson, Vickie L, NP-C  potassium chloride (KLOR-CON) 10 MEQ tablet Take 2  tablets (20 mEq total) by mouth daily. 07/17/21   Arrien, Jimmy Picket, MD  rosuvastatin (CRESTOR) 20 MG tablet Take 1 tablet (20 mg total) by mouth daily. 02/09/21 07/12/21  Tysinger, Camelia Eng, PA-C  vitamin B-12 (CYANOCOBALAMIN) 500 MCG tablet Take 500 mcg by mouth daily.    [provider]    Family History    Family History  Problem Relation Age of Onset   Heart disease Father    Cancer Sister    Tongue cancer Sister    Colon polyps Neg Hx    Rectal cancer Neg Hx    Stomach cancer Neg Hx    She indicated that her mother is deceased. She indicated that her father is deceased. She indicated that only one of her three sisters is alive. She indicated that her brother is alive. She indicated that her maternal grandmother is deceased. She indicated that her maternal grandfather is deceased. She indicated that her paternal grandmother is deceased. She indicated that her paternal grandfather is deceased. She indicated that the status of her neg hx is unknown.  Social History    Social History   Socioeconomic History   Marital status: Married    Spouse name: Richard   Number of children: 1   Years of education: 12   Highest education level: High school graduate  Occupational History   Occupation: Retire-Textile    Employer: RETIRED  Tobacco Use   Smoking status: Former    Packs/day: 1.00    Years: 45.00    Pack years: 45.00    Types: Cigarettes    Quit date: 07/27/2005    Years since quitting: 16.0   Smokeless tobacco: Former    Quit date: 03/2005  Vaping Use   Vaping Use: Never used  Substance and Sexual Activity   Alcohol use: Not Currently    Comment: beer occasionally    Drug use: No   Sexual activity: Not Currently    Partners: Male    Comment: 1st intercourse-22, partners- 79, married- 41 yrs   Other Topics Concern   Not on file  Social History Narrative   Health Care POA:    Emergency Contact: husband, Richard Ambulance person   End of Life Plan:    Who lives  with you: Lives with husband   Any pets: gold fish   Diet: Patient has a varied diet and reports stuggeling with portion size and ice cream.   Exercise: Patient does not have currently exercise routine.   Seatbelts: Patient reports wearing seatbelt when in vehicle.   Nancy Fetter Exposure/Protection: Patient reports not using sun  protection.   Hobbies: Bingo         Social Determinants of Radio broadcast assistant Strain: Low Risk    Difficulty of Paying Living Expenses: Not hard at all  Food Insecurity: No Food Insecurity   Worried About Charity fundraiser in the Last Year: Never true   Arboriculturist in the Last Year: Never true  Transportation Needs: No Transportation Needs   Lack of Transportation (Medical): No   Lack of Transportation (Non-Medical): No  Physical Activity: Inactive   Days of Exercise per Week: 0 days   Minutes of Exercise per Session: 0 min  Stress: No Stress Concern Present   Feeling of Stress : Not at all  Social Connections: Not on file  Intimate Partner Violence: Not on file     Review of Systems    General:  No chills, fever, night sweats or weight changes.  Cardiovascular:  No chest pain, dyspnea on exertion, edema, orthopnea, palpitations, paroxysmal nocturnal dyspnea. Dermatological: No rash, lesions/masses Respiratory: No cough, dyspnea Urologic: No hematuria, dysuria Abdominal:   No nausea, vomiting, diarrhea, bright red blood per rectum, melena, or hematemesis Neurologic:  No visual changes, wkns, changes in mental status. All other systems reviewed and are otherwise negative except as noted above.  Physical Exam    VS:  BP 130/62   Pulse 91   Ht _0  (1.6 m)   Wt 153 lb 9.6 oz (69.7 kg)   SpO2 (!) 86%   BMI 27.21 kg/m  , BMI Body mass index is 27.21 kg/m. GEN: Well nourished, well developed, in no acute distress. HEENT: normal. Neck: Supple, no JVD, carotid bruits, or masses. Cardiac: RRR, no murmurs, rubs, or gallops. No clubbing,  cyanosis, edema.  Radials/DP/PT 2+ and equal bilaterally.  Respiratory:  Respirations regular and unlabored, clear to auscultation bilaterally. GI: Soft, nontender, nondistended, BS + x 4. MS: no deformity or atrophy. Skin: warm and dry, no rash. Neuro:  Strength and sensation are intact. Psych: Normal affect.  Accessory Clinical Findings    Recent Labs: 02/08/2021: TSH 1.670 07/12/2021: B Natriuretic Peptide 727.3 07/13/2021: ALT 21; Hemoglobin 14.3; Magnesium 1.9; Platelets 239 07/17/2021: BUN 13; Creatinine, Ser 0.89; Potassium 4.1; Sodium 141   Recent Lipid Panel    Component Value Date/Time   CHOL 129 05/11/2020 0850   TRIG 133 05/11/2020 0850   HDL 40 05/11/2020 0850   CHOLHDL 3.2 05/11/2020 0850   CHOLHDL 4.4 08/29/2016 0920   VLDL 28 08/29/2016 0920   LDLCALC 65 05/11/2020 0850   LDLDIRECT 81 03/21/2013 1431    ECG personally reviewed by me today-none today.  Echocardiogram 03/08/2021  IMPRESSIONS     1. Left ventricular ejection fraction, by estimation, is 65 to 70%. The  left ventricle has normal function. The left ventricle has no regional  wall motion abnormalities. There is moderate left ventricular hypertrophy.  Left ventricular diastolic  parameters are consistent with Grade I diastolic dysfunction (impaired  relaxation). Elevated left atrial pressure. There is the interventricular  septum is flattened in systole and diastole, consistent with right  ventricular pressure and volume overload.  The average left ventricular global longitudinal strain is -18.2 %. The  global longitudinal strain is normal.   2. Right ventricular systolic function is mildly reduced. The right  ventricular size is moderately enlarged. There is severely elevated  pulmonary artery systolic pressure. The estimated right ventricular  systolic pressure is 67.8 mmHg.   3. Right atrial size was mildly  dilated.   4. The mitral valve is normal in structure. Trivial mitral valve   regurgitation. No evidence of mitral stenosis. Moderate mitral annular  calcification.   5. Tricuspid valve regurgitation is moderate.   6. The aortic valve is calcified. There is moderate calcification of the  aortic valve. There is severe thickening of the aortic valve. Aortic valve  regurgitation is not visualized. Aortic valve sclerosis/calcification is  present, without any evidence of   aortic stenosis.   7. The inferior vena cava is normal in size with greater than 50%  respiratory variability, suggesting right atrial pressure of 3 mmHg.  Right heart cath 07/13/2021 Right atrial pressure 23/22 mmHg ith a mean of 17 mmHg Right ventricular pressure of 67/-2 mmHg with a right ventricular end-diastolic pressure of 21 mmHg Pulmonary artery pressure of 65/27 mmHg with a mean pulmonary artery pressure 44 mmHg  Left ventricular end-diastolic pressure of 4 mmHg SVC oxygen saturation of 61%   Pulmonary artery oxygen saturation 59% Arterial oxygen saturation of 92%   Fick cardiac output of 4.18 L/min Fick cardiac index of 2.35 L/min   If the left ventricular end-diastolic pressure is taken as surrogate for the the left atrial pressure the pulmonary vascular resistance is approximately 10 Woods units (see technique section for further details as wedge pressure could not be obtained).   Conclusion:  Right heart catheterization with pulmonary arterial hypertension with a mean pulmonary artery pressure of 44 mmHg and a pulmonary vascular resistance of approximately 10 Wood units.  Assessment & Plan   1.  Coronary artery disease status post CABG x3-no episodes recently where arm back neck or chest discomfort or noted. Continue aspirin, metoprolol, rosuvastatin Heart healthy low-sodium diet-salty 6 given Increase physical activity as tolerated  Hyperlipidemia-LDL 65 on 05/11/20 Continue rosuvastatin, aspirin Heart healthy low-sodium high-fiber diet Increase physical activity as  tolerated  Chronic diastolic CHF-euvolemic today.  Weight today 153lbs.  NYHA class II-3 at baseline. Continue furosemide, Farxiga, metoprolol, potassium Heart healthy low-sodium diet-salty 6 given Increase physical activity as tolerated Daily weights-contact office with a 2 to 3 pound weight gain overnight or 5 pound weight gain in 1 week  Pulmonary hypertension-no increased DOE or activity intolerance.  Euvolemic today.  Underwent recent cardiac catheterization which showed right arterial pressure 23/22 mean 17, PA pressure 65/27, mean 44.  Details above.  Chest CT completed around the same time showed no pulmonary embolus, groundglass pulmonary opacities, and trace right pleural effusion.  She responded well to IV diuresis, bronchodilators, and inhaled corticosteroids. Continue Farxiga, furosemide, potassium Follows with pulmonology, Advanced heart failure clinic  Type 2 diabetes-glucose 100 on 07/17/2021 Continue current medical therapy Follows with PCP  Disposition: Follow-up with Dr. Margaretann Loveless or me  as scheduled.  Jossie Ng. Vaanya Shambaugh NP-C    07/26/2021, 3:16 PM [facility] [place] [address] Office [phone] Fax [phone]  Notice: This dictation was prepared with Dragon dictation along with smaller phrase technology. Any transcriptional errors that result from this process are unintentional and may not be corrected upon review.  I spent 14 minutes examining this patient, reviewing medications, and using patient centered shared decision making involving her cardiac care.  Prior to her visit I spent greater than 20 minutes reviewing her past medical history,  medications, and prior cardiac tests.

## 2021-07-26 ENCOUNTER — Ambulatory Visit (INDEPENDENT_AMBULATORY_CARE_PROVIDER_SITE_OTHER): Payer: No Typology Code available for payment source | Admitting: General Practice

## 2021-07-26 ENCOUNTER — Encounter: Payer: Self-pay | Admitting: General Practice

## 2021-07-26 VITALS — BP 130/62 | HR 91 | Ht 63.0 in | Wt 153.6 lb

## 2021-07-26 DIAGNOSIS — E1165 Type 2 diabetes mellitus with hyperglycemia: Secondary | ICD-10-CM | POA: Diagnosis not present

## 2021-07-26 DIAGNOSIS — I2510 Atherosclerotic heart disease of native coronary artery without angina pectoris: Secondary | ICD-10-CM | POA: Diagnosis not present

## 2021-07-26 DIAGNOSIS — Z951 Presence of aortocoronary bypass graft: Secondary | ICD-10-CM | POA: Diagnosis not present

## 2021-07-26 DIAGNOSIS — E1169 Type 2 diabetes mellitus with other specified complication: Secondary | ICD-10-CM

## 2021-07-26 DIAGNOSIS — I5032 Chronic diastolic (congestive) heart failure: Secondary | ICD-10-CM

## 2021-07-26 DIAGNOSIS — Z794 Long term (current) use of insulin: Secondary | ICD-10-CM

## 2021-07-26 DIAGNOSIS — E785 Hyperlipidemia, unspecified: Secondary | ICD-10-CM | POA: Diagnosis not present

## 2021-07-26 DIAGNOSIS — I2721 Secondary pulmonary arterial hypertension: Secondary | ICD-10-CM

## 2021-07-26 IMAGING — DX DG CHEST 2V
2 series · 2 of 2 positions shown · non-contrast
Comparison: 09/04/2019.

CLINICAL DATA: History of pneumothorax.

EXAM:
CHEST - 2 VIEW

[chest lat]
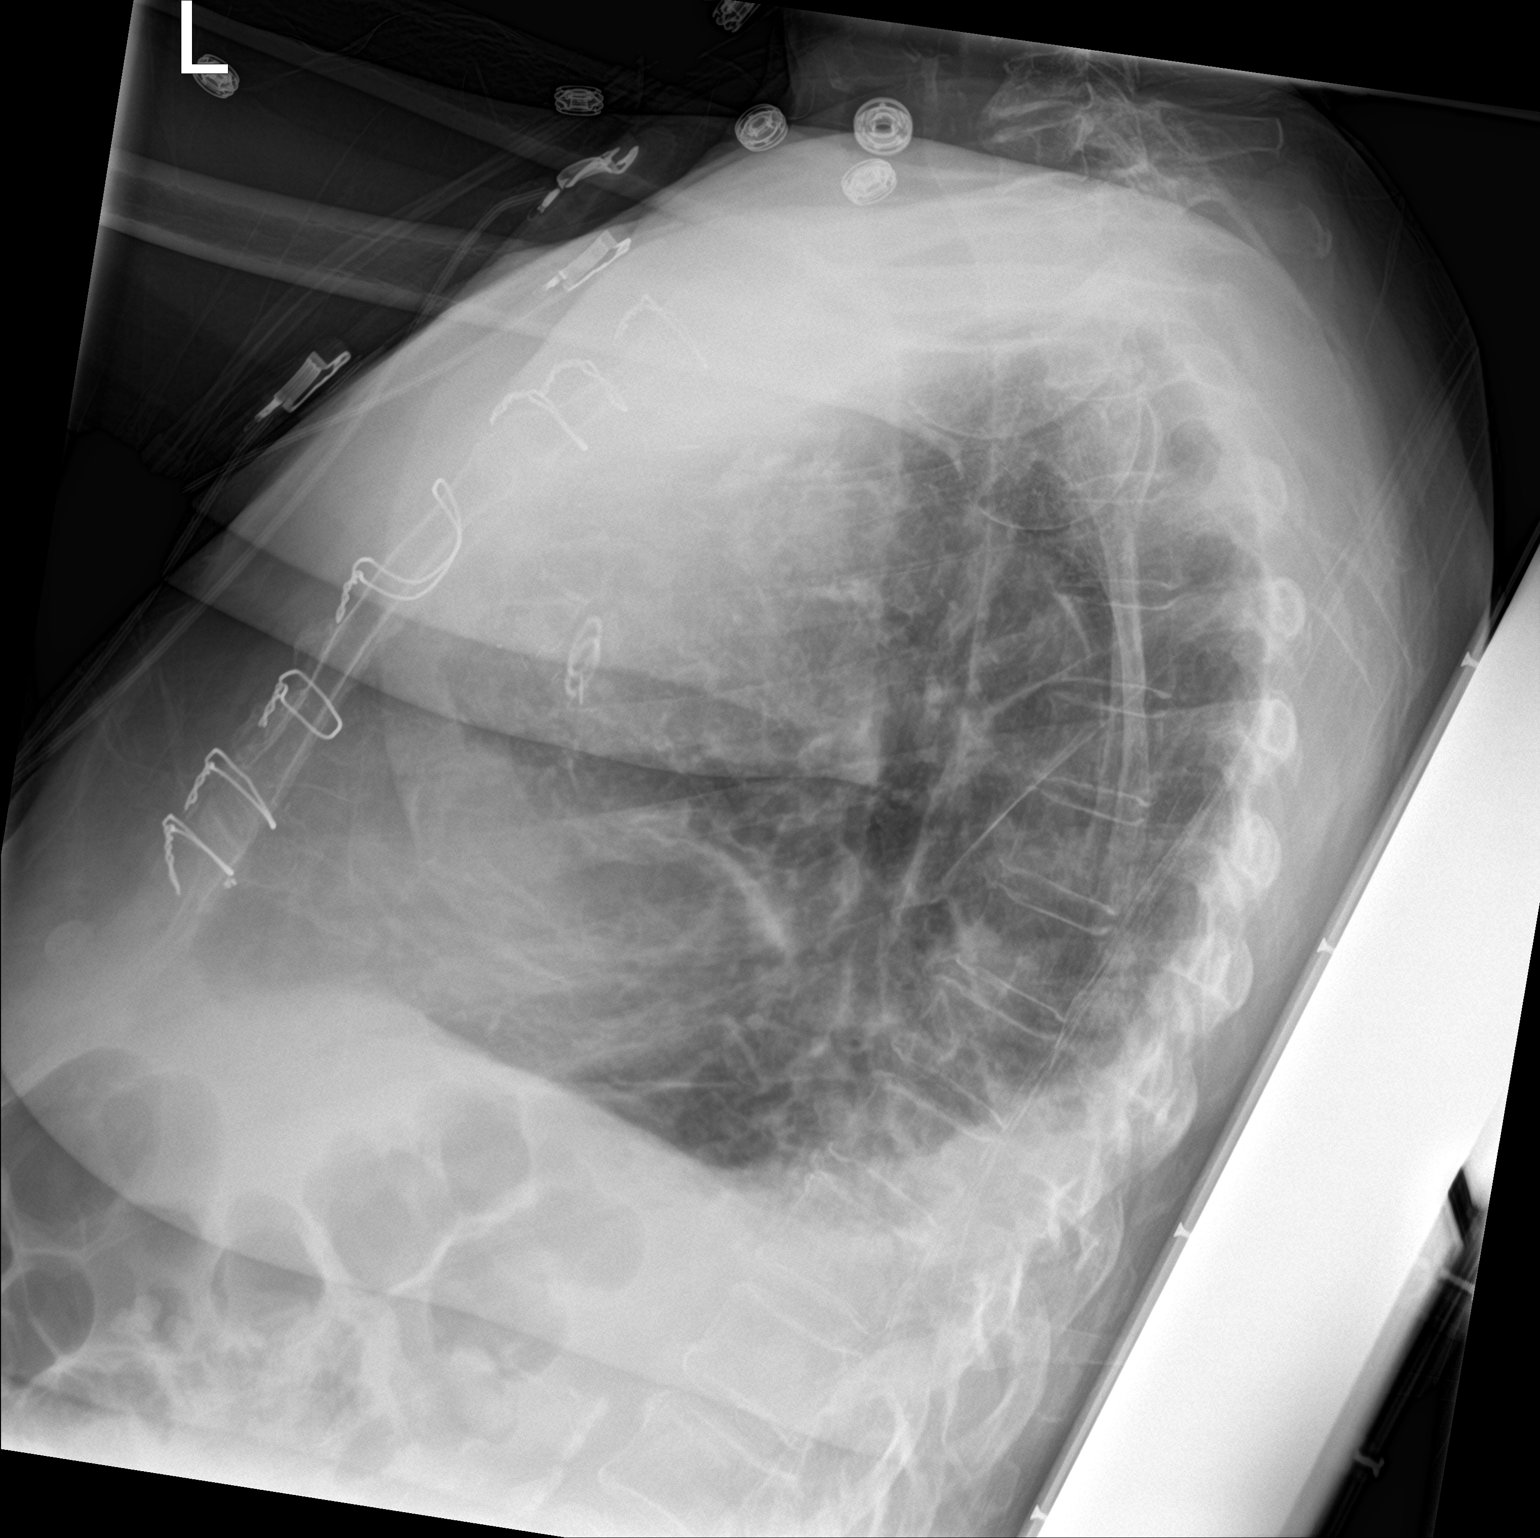

[chest ap]
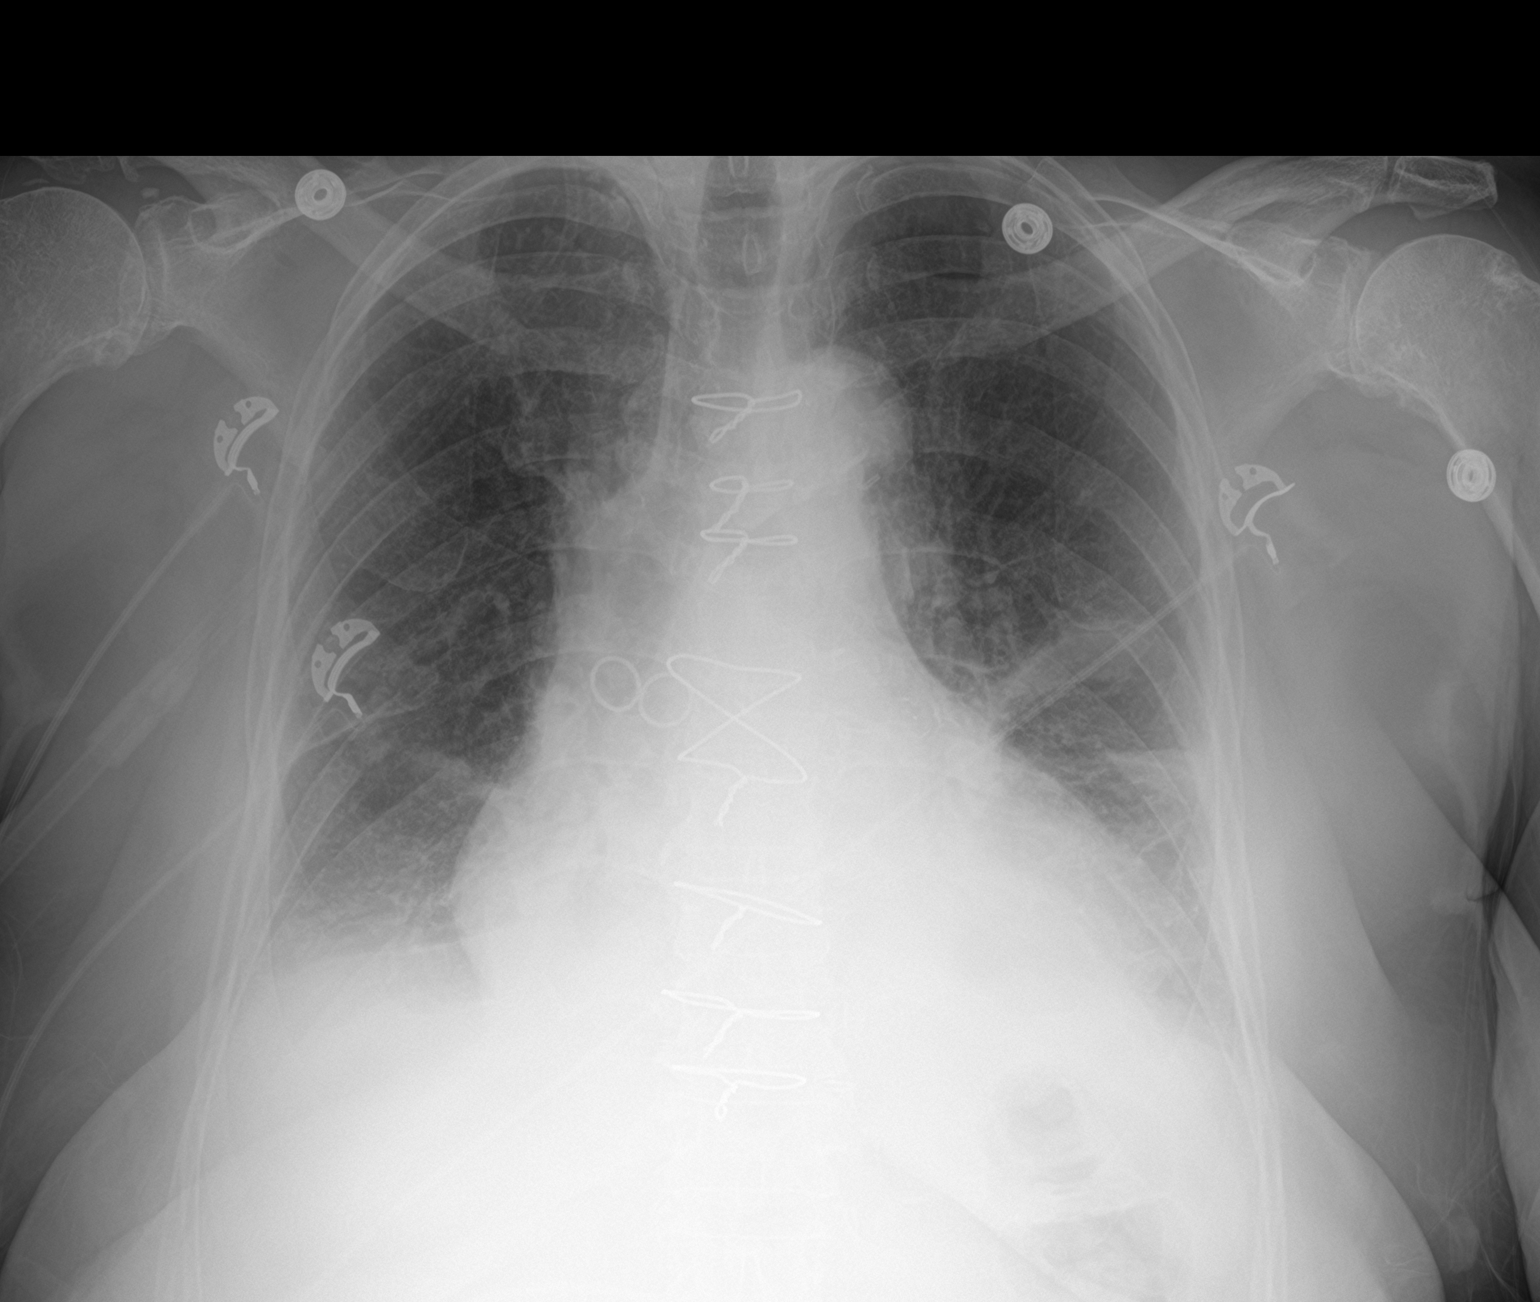

[2 of 2 positions shown; findings below may reference images not displayed]

FINDINGS: Interim removal of left chest tube. No pneumothorax noted. Interim
removal right IJ sheath and mediastinal drainage catheter. Prior
CABG. Stable cardiomegaly. No pulmonary venous congestion. Interim
decrease interstitial prominence suggesting clearing of interstitial
edema. Low lung volumes with persistent bibasilar
atelectasis/infiltrates. Small bilateral pleural effusions.
IMPRESSION: 1. Interim removal of left chest tube. No pneumothorax noted.
Interim removal of right IJ sheath and mediastinal drainage
catheter.

2. Prior CABG. Stable cardiomegaly. No pulmonary venous congestion.
Interim decrease interstitial prominence suggesting clearing of
interstitial edema.

3. Low lung volumes with persistent bibasilar
atelectasis/infiltrates. Small bilateral pleural effusions.

## 2021-07-26 NOTE — Patient Instructions (Signed)
Medication Instructions:  The current medical regimen is effective;  continue present plan and medications as directed. Please refer to the Current Medication list given to you today.   *If you need a refill on your cardiac medications before your next appointment, please call your pharmacy*  Lab Work:   Testing/Procedures:  NONE    NONE  If you have labs (blood work) drawn today and your tests are completely normal, you will receive your results only by: [place] (if you have MyChart) OR  A paper copy in the mail If you have any lab test that is abnormal or we need to change your treatment, we will call you to review the results.  Special Instructions PLEASE READ AND FOLLOW SALTY 6-ATTACHED-1,857m daily  PLEASE INCREASE PHYSICAL ACTIVITY AS TOLERATED   TAKE AND LOG YOUR WEIGHT DAILY AND BRING LOG WITH YOU TO FOLLOW UP APPOINTMENT.  Follow-Up: Your next appointment:  KEEP SCHEDULED  APPOINTMENT  In Person with GElouise Munroe MD  1  [facility] you and your health needs are our priority.  As part of our continuing mission to provide you with exceptional heart care, we have created designated Provider Care Teams.  These Care Teams include your primary Cardiologist (physician) and Advanced Practice Providers (APPs -  Physician Assistants and Nurse Practitioners) who all work together to provide you with the care you need, when you need it.  We recommend signing up for the patient portal called "MyChart".  Sign up information is provided on this After Visit Summary.  MyChart is used to connect with patients for Virtual Visits (Telemedicine).  Patients are able to view lab/test results, encounter notes, upcoming appointments, etc.  Non-urgent messages can be sent to your provider as well.   To learn more about what you can do with MyChart, go to [email or web address]     Important Information About Sugar               6 SALTY THINGS TO AVOID     1,800MG  DAILY

## 2021-07-27 ENCOUNTER — Ambulatory Visit (INDEPENDENT_AMBULATORY_CARE_PROVIDER_SITE_OTHER): Payer: No Typology Code available for payment source | Admitting: Medical

## 2021-07-27 ENCOUNTER — Other Ambulatory Visit: Payer: Self-pay | Admitting: Medical

## 2021-07-27 VITALS — BP 110/68 | HR 112 | Wt 154.6 lb

## 2021-07-27 DIAGNOSIS — R0602 Shortness of breath: Secondary | ICD-10-CM | POA: Diagnosis not present

## 2021-07-27 DIAGNOSIS — I10 Essential (primary) hypertension: Secondary | ICD-10-CM | POA: Diagnosis not present

## 2021-07-27 DIAGNOSIS — I5032 Chronic diastolic (congestive) heart failure: Secondary | ICD-10-CM

## 2021-07-27 DIAGNOSIS — I2721 Secondary pulmonary arterial hypertension: Secondary | ICD-10-CM

## 2021-07-27 DIAGNOSIS — E1165 Type 2 diabetes mellitus with hyperglycemia: Secondary | ICD-10-CM

## 2021-07-27 DIAGNOSIS — J439 Emphysema, unspecified: Secondary | ICD-10-CM | POA: Diagnosis not present

## 2021-07-27 DIAGNOSIS — Z87891 Personal history of nicotine dependence: Secondary | ICD-10-CM | POA: Diagnosis not present

## 2021-07-27 DIAGNOSIS — I2510 Atherosclerotic heart disease of native coronary artery without angina pectoris: Secondary | ICD-10-CM | POA: Diagnosis not present

## 2021-07-27 DIAGNOSIS — Z79899 Other long term (current) drug therapy: Secondary | ICD-10-CM | POA: Diagnosis not present

## 2021-07-27 DIAGNOSIS — E876 Hypokalemia: Secondary | ICD-10-CM | POA: Diagnosis not present

## 2021-07-27 DIAGNOSIS — Z951 Presence of aortocoronary bypass graft: Secondary | ICD-10-CM

## 2021-07-27 NOTE — Progress Notes (Signed)
Subjective:  Jessica Hart is a 81 y.o. female who presents for Chief Complaint  Patient presents with   Hospitalization Follow-up    Hospital follow-up. Wants to see about handicap placard. Gets short of breath when walking long distance , weak. Saw cardiology yesterday     Here for hospital f/u.   Was hospitalized recently May 16 through Jul 17, 2021.  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    Today she notes that she does feel little short of breath.  She notes that her portable oxygen is at 5 L/min but she does not feel like it works that well.  Does rely on her inspiration but she does not feel she is ready for.  It is a pulse type device.  However when she is on her oxygen here at 4 L/min with continual nasal cannula she does better and she seems to do better on her home main tank as well.  She continues on Trelegy.  Her next follow-up with pulmonology is next month in June.  Med changes per this hospital visit was increasing Lasix up to 60 mg daily or 3 tablets daily and potassium was changed from 10 to 20 units daily.  Diabetes-compliant with 15 units of Tresiba daily, Farxiga 5 mg daily, metformin 750 mg XR daily.  Hypertension-amlodipine was discontinued this hospitalization due to hypotension.  She continues on losartan 50 mg daily and metoprolol 37.5 mg twice daily  Home health physical therapy started yesterday.  She has questions about Glucerna meal replacement.  She had requested some of these.  Samples were given by CMA today.  She eats 3 meals a day but sometimes uses a Glucerna  She is is also requesting a handicap placard  No other aggravating or relieving  factors.    No other c/o.  Past Medical History:  Diagnosis Date   Arthritis    Cataract bilaterl 3 years ago   Coronary artery disease    Diabetes mellitus 02/27/2005   Diabetes mellitus type 2, controlled, without complications ([facility]) 6/43/3295   Qualifier: Diagnosis of  By: Drucie Ip     Diverticulosis    Dyspnea    Echocardiogram findings abnormal, without diagnosis 2005   EF 47%, no ischemia, no infarction   Former smoker 01/10/2017   45 pack year history, quit in 2007    GERD (gastroesophageal reflux disease)    Heart murmur    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   History of bone density study 2006   Lt hip = -1.0, L spine = -1.8    Hyperlipidemia    Hypertension    Current Outpatient Medications on File Prior to Visit  Medication Sig Dispense Refill   acetaminophen (TYLENOL) 500 MG tablet Take 2 tablets (1,000 mg total) by mouth every 8 (eight) hours as needed. (Patient taking differently: Take 1,000 mg by mouth 2 (two) times daily.) 30 tablet 2   aspirin EC 81 MG tablet Take 1 tablet (81 mg total) by mouth daily. Swallow whole. 90 tablet 3   Cholecalciferol (D3 5000) 125 MCG (5000 UT) capsule Take 5,000 Units by mouth daily.     clotrimazole-betamethasone (LOTRISONE) cream Apply 1 application topically daily. 30 g 0  CREON 36000-114000 units CPEP capsule TAKE 2 CAPSULES BY MOUTH 3 TIMES A DAY WITH MEALS AND TAKE 1 CAPSULE WITH EACH SNACK TWICE DAILY 240 capsule 11   dapagliflozin propanediol (FARXIGA) 5 MG TABS tablet Take 1 tablet (5 mg total) by mouth daily. 90 tablet 1   Fluticasone-Umeclidin-Vilant (TRELEGY ELLIPTA) 100-62.5-25 MCG/ACT AEPB Inhale 1 puff into the lungs daily. 1 each 2   furosemide (LASIX) 20 MG tablet Take 3 tablets (60 mg total) by mouth daily. 90 tablet 0   insulin degludec (TRESIBA FLEXTOUCH) 100 UNIT/ML FlexTouch Pen Inject 15 Units into the skin daily after supper. 3 mL 5   Insulin Pen Needle (PEN NEEDLES 5/16") 30G X 8 MM MISC Pen needles used for  lantus injections 100 each 3   Insulin Pen Needle (PEN NEEDLES) 32G X 4 MM MISC 1 each by Other route daily. 100 each 1   losartan (COZAAR) 50 MG tablet Take 1 tablet (50 mg total) by mouth daily. 90 tablet 1   metFORMIN (GLUCOPHAGE-XR) 750 MG 24 hr tablet TAKE 1 TABLET BY MOUTH EVERY DAY WITH BREAKFAST 90 tablet 1   metoprolol tartrate (LOPRESSOR) 25 MG tablet Take 37.5 mg by mouth 2 (two) times daily.     Misc. Devices MISC SIG: 1 L of Oxygen via Nasal cannula around the clock Dispense Home Oxygen and Portable tank and necessary supplies. Dx: Hypoxemia, Shortness of breath (Patient taking differently: SIG: 3.5 L of Oxygen via Nasal cannula around the clock Dispense Home Oxygen and Portable tank and necessary supplies. Dx: Hypoxemia, Shortness of breath) 1 Device 0   Multiple Vitamin (MULTIVITAMIN WITH MINERALS) TABS tablet Take 1 tablet by mouth daily. 30 tablet 0   Nutritional Supplements (,FEEDING SUPPLEMENT, PROSOURCE PLUS) liquid Take 30 mLs by mouth 2 (two) times daily between meals. 1800 mL 0   omeprazole (PRILOSEC) 40 MG capsule TAKE 1 CAPSULE BY MOUTH EVERY DAY (Patient taking differently: Take 40 mg by mouth daily. TAKE 1 CAPSULE BY MOUTH EVERY DAY) 90 capsule 1   OneTouch Delica Lancets 95A MISC USE TO TEST BLOOD SUGAR ONCE A DAY 100 each 1   ONETOUCH VERIO test strip USE 1 STRIP TO CHECK GLUCOSE ONCE DAILY 100 strip 1   potassium chloride (KLOR-CON) 10 MEQ tablet Take 2 tablets (20 mEq total) by mouth daily. 90 tablet 0   vitamin B-12 (CYANOCOBALAMIN) 500 MCG tablet Take 500 mcg by mouth daily.     rosuvastatin (CRESTOR) 20 MG tablet Take 1 tablet (20 mg total) by mouth daily. 90 tablet 3   No current facility-administered medications on file prior to visit.     The following portions of the patient's history were reviewed and updated as appropriate: allergies, current medications, past family history, past medical history, past social history, past surgical history and problem  list.  ROS Otherwise as in subjective above    Objective: BP 110/68   Pulse (!) 112   Wt 154 lb 9.6 oz (70.1 kg)   SpO2 (!) 84%   BMI 27.39 kg/m   Wt Readings from Last 3 Encounters:  07/27/21 154 lb 9.6 oz (70.1 kg)  07/26/21 153 lb 9.6 oz (69.7 kg)  07/16/21 164 lb 7.4 oz (74.6 kg)    General appearance: alert, no distress, well developed, well nourished Neck: supple, no lymphadenopathy, no thyromegaly, no masses Heart: RRR, normal S1, S2, no murmurs Lungs: CTA bilaterally, no wheezes, rhonchi, or rales Abdomen: +bs, soft, non tender, non distended, no masses, no hepatomegaly, no splenomegaly  Pulses: 2+ radial pulses, 2+ pedal pulses, normal cap refill Ext: no edema   Assessment: Encounter Diagnoses  Name Primary?   Pulmonary artery hypertension (HCC) Yes   Chronic diastolic congestive heart failure (HCC)    High risk medication use    Hypokalemia    SOB (shortness of breath)    S/P CABG x 3    Pulmonary emphysema, unspecified emphysema type (HCC)    Coronary artery disease involving native coronary artery of native heart without angina pectoris    Former smoker    Primary hypertension    Type 2 diabetes mellitus with hyperglycemia, unspecified whether long term insulin use ([place])      Plan: I reviewed her hospital discharge summary, medications reconciled, reviewed her recent labs and findings  Chronic CHF-I reviewed her cardiology notes from yesterday appointment.  Medications were continued unchanged from the hospitalization.  Updated basic metabolic lab today given the increase in Lasix and potassium.  She does have follow-up planned with advanced heart failure clinic  Pulmonary artery hypertension-she has follow-up planned with pulmonology coming up soon in June  Pulmonary emphysema -on oxygen 4 to 5 L/min, on Trelegy.  I completed the handicap placard form on her behalf today.  I will reach out to her pulmonologist about her concerns with the  oxygen.  Hypokalemia-updated labs today, continue potassium 20 mEq daily increased at the recent hospitalization  CAD-continue statin, aspirin, metoprolol, continue good blood pressure control  Diabetes-continue current medication, labs reviewed from the last few months.  Most recent hemoglobin A1c in May was less than 7%.  Tolerating medications currently just fine  We discussed the Glucerna meal replacement.  Based on our discussion she probably does not need this at all but she wants to have these on hand.  Keyshla was seen today for hospitalization follow-up.  Diagnoses and all orders for this visit:  Pulmonary artery hypertension (HCC)  Chronic diastolic congestive heart failure (HCC)  High risk medication use -     Basic metabolic panel  Hypokalemia -     Basic metabolic panel  SOB (shortness of breath)  S/P CABG x 3  Pulmonary emphysema, unspecified emphysema type (HCC)  Coronary artery disease involving native coronary artery of native heart without angina pectoris  Former smoker  Primary hypertension  Type 2 diabetes mellitus with hyperglycemia, unspecified whether long term insulin use ([place])    Follow up: pending labs

## 2021-07-28 LAB — BASIC METABOLIC PANEL
BUN/Creatinine Ratio: 18 (ref 12–28)
BUN: 18 mg/dL (ref 8–27)
CO2: 21 mmol/L (ref 20–29)
Calcium: 10 mg/dL (ref 8.7–10.3)
Chloride: 100 mmol/L (ref 96–106)
Creatinine, Ser: 0.99 mg/dL (ref 0.57–1.00)
Glucose: 193 mg/dL — ABNORMAL HIGH (ref 70–99)
Potassium: 4.3 mmol/L (ref 3.5–5.2)
Sodium: 140 mmol/L (ref 134–144)
eGFR: 58 mL/min/{1.73_m2} — ABNORMAL LOW (ref >59)

## 2021-08-02 ENCOUNTER — Other Ambulatory Visit: Payer: Self-pay | Admitting: Medical

## 2021-08-03 DIAGNOSIS — R69 Illness, unspecified: Secondary | ICD-10-CM | POA: Diagnosis not present

## 2021-08-05 ENCOUNTER — Telehealth: Payer: Self-pay | Admitting: Pulmonary Disease

## 2021-08-07 NOTE — Progress Notes (Signed)
_0  ID: Jessica Hart, female    DOB: [birth year], 81 y.o.   MRN: [id number]  Chief Complaint  Patient presents with   Follow-up    Pt states she is on 3L of continus oxygen and is complaining of the fact that the tanks dont last long. Pt is here to go over CT scan results     Referring provider: Carlena Hurl, PA-C  HPI:   81 y.o. woman whom we are seeing in follow up for evaluation of chronic hypoxemic respiratory failure as well as newly discovered elevated pulmonary pressures on TTE.  She is on oxygen..  Stable 3 L.  She thinks oxygen is too much.  Unclear if she is actually wearing all the time.  Strongly encouraged her to get a pulse oximeter to check.  As long as her oxygen stays above 92% she can decrease the oxygen.  Not sure will have much success decreasing.  Reviewed most recent CT scan that shows possible early signs of pulmonary fibrosis as well as significant emphysema which explains her hypoxemia.  HPI at initial visit: Was in usual state of health.  Was seen PCP and routine follow-up.  Noted to be hypoxemic work-up, rooming.  Noted little bit of cough for 3 days.  She was placed on 1 L of oxygen.  Came up to 92%.  She was placed on Trelegy.  She was referred to pulmonary.  Home oxygen was delivered.  She represented to PCP about 10 days later.  Noted to be at 87% on 1 L with exertion although no changes were made to oxygen prescription.  She denies significant dyspnea, nothing too significant.  Able to do tasks he needs to do.  Reviewed multiple CT scans that shows mild emphysematous changes primarily in the upper lobes with interlobular septal thickening and bronchiectasis in bilateral lower lobes without clear honeycombing on my interpretation.  PMH: Hypertension, diabetes, hyperlipidemia, tobacco abuse in remission surgical history: Colon surgery, hysterectomy Family history: CAD in first relatives, no significant [name] is a 81 year old with Social  history: Former smoker, 45-pack-year, quit 2008, retired, lives with husband Restaurant manager, fast food / Pulmonary Flowsheets:   ACT:      No data to display          MMRC:     No data to display          Epworth:      No data to display          Tests:   FENO:  No results found for: "NITRICOXIDE"  PFT:    Latest Ref Rng & Units 04/06/2021    3:35 PM  PFT Results  FVC-Pre L 1.74   FVC-Predicted Pre % 89   FVC-Post L 1.64   FVC-Predicted Post % 84   Pre FEV1/FVC % % 83   Post FEV1/FCV % % 84   FEV1-Pre L 1.44   FEV1-Predicted Pre % 96   FEV1-Post L 1.37   DLCO uncorrected ml/min/mmHg 6.61   DLCO UNC% % 36   DLVA Predicted % 55   TLC L 3.72   TLC % Predicted % 75   RV % Predicted % 60   Personally reviewed interpret spirometry suggestive of moderate to mild restriction versus gas trapping, no bronchodilator response, no fixed obstruction, TLC moderately reduced, DLCO severely reduced  WALK:      No data to display          Imaging: Personally reviewed and as per  EMR discussion of this note VAS [place] UPPER EXTREMITY VENOUS DUPLEX  Result Date: 07/15/2021 UPPER VENOUS STUDY  Patient Name:  Jessica Hart  Date of Exam:   07/14/2021 Medical Rec #: [id number]            Accession #:    [id number] Date of Birth: Jun 30, 1940            Patient Gender: F Patient Age:   71 years Exam Location:  [facility] Procedure:      VAS [place] UPPER EXTREMITY VENOUS DUPLEX Referring Phys: Jessica Hart --------------------------------------------------------------------------------  Indications: Pain, knotting, and Swelling in right neck status post radial cath. Swelling and pain left neck post line placement and removal. Limitations: Breathing, constant movement. Patient also on 9 liters of oxygen. Comparison Study: No prior study Performing Technologist: Jessica Hart RVS Supporting Technologist: Jessica Hart RVT, RDMS  Examination Guidelines: A complete evaluation  includes B-mode imaging, spectral Doppler, color Doppler, and power Doppler as needed of all accessible portions of each vessel. Bilateral testing is considered an integral part of a complete examination. Limited examinations for reoccurring indications may be performed as noted.  Right Findings: +----------+------------+---------+-----------+----------+-------+ RIGHT     CompressiblePhasicitySpontaneousPropertiesSummary +----------+------------+---------+-----------+----------+-------+ IJV                      Yes       Yes                      +----------+------------+---------+-----------+----------+-------+ Subclavian               Yes       Yes                      +----------+------------+---------+-----------+----------+-------+  Left Findings: +----------+------------+---------+-----------+----------+-------------------+ LEFT      CompressiblePhasicitySpontaneousProperties      Summary       +----------+------------+---------+-----------+----------+-------------------+ IJV           Full       Yes       Yes              Rouleaux flow noted +----------+------------+---------+-----------+----------+-------------------+ Subclavian               Yes       Yes                                  +----------+------------+---------+-----------+----------+-------------------+  Summary:  Right: There is no DVT noted in the right IJV or subclavian vein. 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 Unable to locate the neck of the pseudoaneurysm secondary to breathing and movement.  Left: No DVT noted in the IJV or the subclavian vein.  *See table(s) above for measurements and observations.  Diagnosing physician: Jessica Hart Electronically signed by Jessica Hart on 07/15/2021 at 5:13:01 PM.    Final    CT Angio Chest Pulmonary Embolism (PE) W or WO Contrast  Result Date:  07/14/2021 CLINICAL DATA:  Pulmonary embolism (PE) suspected, high prob. Acute on chronic hypoxemic respiratory failure. Lower extremity edema. EXAM: CT ANGIOGRAPHY CHEST WITH CONTRAST TECHNIQUE: Multidetector CT imaging of the chest was performed using the standard protocol during bolus administration of intravenous contrast. Multiplanar CT image reconstructions and MIPs were obtained to evaluate the vascular anatomy. RADIATION DOSE REDUCTION: This exam was performed according  to the departmental dose-optimization program which includes automated exposure control, adjustment of the mA and/or kV according to patient size and/or use of iterative reconstruction technique. CONTRAST:  25m OMNIPAQUE IOHEXOL 350 MG/ML SOLN COMPARISON:  05/19/2021 FINDINGS: Cardiovascular: There is adequate opacification the pulmonary arterial tree. No intraluminal filling defect identified to suggest acute pulmonary embolism. The central pulmonary arteries are minimally dilated suggesting changes of pulmonary arterial hypertension. Coronary artery bypass grafting has been performed. There is global cardiomegaly with particular enlargement of the right ventricle and right atrium with reflux of contrast into the hepatic venous system suggesting elevated right heart pressure and at least some degree of right heart failure. No pericardial effusion. Extensive atherosclerotic calcification within the thoracic aorta. No aortic aneurysm. Particularly prominent atherosclerotic calcification noted at the origin of the left common carotid artery, however, the degree of stenosis is not well assessed on this examination due to phase of contrast administration. Mediastinum/Nodes: Visualized thyroid is unremarkable. Borderline mediastinal adenopathy within the prevascular, right hilar, and subcarinal lymph node groups appears stable since prior examination, nonspecific. The esophagus is unremarkable. Small hiatal hernia. Lungs/Pleura: Subtle diffuse  ground-glass pulmonary infiltrate, interlobular septal thickening, and trace right pleural effusion is present, new since prior examination in keeping with pulmonary edema and likely reflecting changes of at least mild cardiogenic failure. Superimposed minimal subpleural architectural distortion with traction bronchiolectasis best appreciated within the posterior basal segments of the lower lobes bilaterally is stable since prior examination in keeping with underlying interstitial lung disease. No pneumothorax. Central airways are widely patent. Upper Abdomen: Trace ascites, new since prior examination. No acute abnormality. Musculoskeletal: No acute bone abnormality. Review of the MIP images confirms the above findings. IMPRESSION: No pulmonary embolism. Morphologic changes in keeping with elevated pulmonary and right heart pressure. Findings in keeping with at least mild cardiogenic failure with mild interstitial and alveolar pulmonary edema, small right pleural effusion, mild ascites, and prominent reflux of contrast into the hepatic venous system. Stable borderline mediastinal adenopathy, nonspecific. This may be reactive in the setting of cardiogenic failure. Stable chronic interstitial lung changes. Status post coronary artery bypass grafting. Aortic atherosclerosis with prominent atherosclerotic calcification involving the arch vasculature. If there is evidence of cerebrovascular insufficiency, CT arteriography may be more helpful for further evaluation. Aortic Atherosclerosis (ICD10-I70.0). Electronically Signed   By: AFidela SalisburyM.D.   On: 07/14/2021 20:06   CARDIAC CATHETERIZATION  Result Date: 07/13/2021 RHC: Right atrial pressure 23/22 mmHg ith a mean of 17 mmHg Right ventricular pressure of 67/-2 mmHg with a right ventricular end-diastolic pressure of 21 mmHg Pulmonary artery pressure of 65/27 mmHg with a mean pulmonary artery pressure 44 mmHg Left ventricular end-diastolic pressure of 4 mmHg SVC  oxygen saturation of 61% Pulmonary artery oxygen saturation 59% Arterial oxygen saturation of 92% Fick cardiac output of 4.18 L/min Fick cardiac index of 2.35 L/min If the left ventricular end-diastolic pressure is taken as surrogate for the the left atrial pressure the pulmonary vascular resistance is approximately 10 Woods units (see technique section for further details as wedge pressure could not be obtained). Conclusion:  Right heart catheterization with pulmonary arterial hypertension with a mean pulmonary artery pressure of 44 mmHg and a pulmonary vascular resistance of approximately 10 Wood units.  DG Chest 1 View  Result Date: 07/12/2021 CLINICAL DATA:  Shortness of breath with swelling. EXAM: CHEST  1 VIEW COMPARISON:  02/08/2021. FINDINGS: Chronic interstitial opacities. No confluent consolidation. No visible pleural effusions or pneumothorax. Enlarged cardiac silhouette. Median sternotomy and  CABG. Polyarticular degenerative change. IMPRESSION: 1. Chronic interstitial opacities, probably in part due to the interstitial lung disease seen on prior CT chest. Superimposed mild interstitial edema is not excluded. 2. Cardiomegaly. Electronically Signed   By: Margaretha Sheffield M.D.   On: 07/12/2021 16:33    Lab Results: Personally reviewed, no anemia, no polycythemia CBC    Component Value Date/Time   WBC 8.5 07/13/2021 0300   RBC 4.80 07/13/2021 0300   HGB 14.3 07/13/2021 1649   HGB 14.5 02/08/2021 1205   HCT 42.0 07/13/2021 1649   HCT 45.6 02/08/2021 1205   PLT 239 07/13/2021 0300   PLT 286 02/08/2021 1205   MCV 85.4 07/13/2021 0300   MCV 85 02/08/2021 1205   MCH 27.9 07/13/2021 0300   MCHC 32.7 07/13/2021 0300   RDW 16.6 (H) 07/13/2021 0300   RDW 14.6 02/08/2021 1205   LYMPHSABS 1.3 07/13/2021 0300   LYMPHSABS 1.9 02/08/2021 1205   MONOABS 0.7 07/13/2021 0300   EOSABS 0.2 07/13/2021 0300   EOSABS 0.1 02/08/2021 1205   BASOSABS 0.1 07/13/2021 0300   BASOSABS 0.1 02/08/2021 1205     BMET    Component Value Date/Time   NA 140 07/27/2021 1134   K 4.3 07/27/2021 1134   CL 100 07/27/2021 1134   CO2 21 07/27/2021 1134   GLUCOSE 193 (H) 07/27/2021 1134   GLUCOSE 100 (H) 07/17/2021 0300   BUN 18 07/27/2021 1134   CREATININE 0.99 07/27/2021 1134   CREATININE 0.69 01/10/2017 1235   CALCIUM 10.0 07/27/2021 1134   GFRNONAA >60 07/17/2021 0300   GFRNONAA 86 01/10/2017 1235   GFRAA 97 02/02/2020 1108   GFRAA 99 01/10/2017 1235    BNP    Component Value Date/Time   BNP 727.3 (H) 07/12/2021 1546    ProBNP No results found for: "PROBNP"  Specialty Problems       Pulmonary Problems   SOB (shortness of breath)   Pulmonary emphysema (HCC)   Pulmonary nodules    Allergies  Allergen Reactions   Lisinopril Itching and Cough    Immunization History  Administered Date(s) Administered   Fluad Quad(high Dose 65+) 11/11/2018   Influenza Whole 12/11/2007   Influenza, High Dose Seasonal PF 02/29/2016, 01/10/2017, 11/15/2017   Influenza,inj,Quad PF,6+ Mos 10/29/2012, 04/06/2014, 02/19/2015   PFIZER(Purple Top)SARS-COV-2 Vaccination 04/22/2019, 05/13/2019, 01/30/2020   Pneumococcal Conjugate-13 04/06/2014   Pneumococcal Polysaccharide-23 12/11/2007   Td 05/28/2004    Past Medical History:  Diagnosis Date   Arthritis    Cataract bilaterl 3 years ago   Coronary artery disease    Diabetes mellitus 02/27/2005   Diabetes mellitus type 2, controlled, without complications ([place]) 6/59/9357   Qualifier: Diagnosis of  By: Drucie Ip     Diverticulosis    Dyspnea    Echocardiogram findings abnormal, without diagnosis 2005   EF 47%, no ischemia, no infarction   Former smoker 01/10/2017   45 pack year history, quit in 2007    GERD (gastroesophageal reflux disease)    Heart murmur    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 (Patient taking differently: Take 1,000 mg by mouth 2 (two) times daily.)   aspirin EC 81 MG tablet Take 1 tablet (81 mg total) by mouth daily. Swallow whole.   Cholecalciferol (D3 5000) 125 MCG (5000 UT) capsule Take 5,000 Units by mouth daily.   clotrimazole-betamethasone (LOTRISONE) cream Apply 1 application topically daily.   dapagliflozin propanediol (FARXIGA) 5 MG TABS tablet Take 1 tablet (5 mg total) by mouth daily.   Fluticasone-Umeclidin-Vilant (TRELEGY ELLIPTA) 100-62.5-25 MCG/ACT AEPB Inhale 1 puff into the lungs daily.   Insulin Pen Needle (PEN NEEDLES 5/16") 30G X 8 MM MISC Pen needles used for lantus injections   Insulin Pen Needle (PEN NEEDLES) 32G X 4 MM MISC 1 each by Other route daily.   metFORMIN (GLUCOPHAGE-XR) 750 MG 24 hr tablet TAKE 1 TABLET BY MOUTH EVERY DAY WITH BREAKFAST   Misc. Devices MISC SIG: 1 L of Oxygen via Nasal cannula around the clock Dispense Home Oxygen and Portable tank and necessary supplies. Dx: Hypoxemia, Shortness of breath (Patient taking differently: SIG: 3.5 L of Oxygen via Nasal cannula around the clock Dispense Home Oxygen and Portable tank and necessary supplies. Dx: Hypoxemia, Shortness of breath)   omeprazole (PRILOSEC) 40 MG capsule TAKE 1 CAPSULE BY MOUTH EVERY DAY (Patient taking differently: Take 40 mg by mouth daily. TAKE 1 CAPSULE BY MOUTH EVERY DAY)   OneTouch Delica Lancets 49S MISC USE TO TEST BLOOD SUGAR ONCE A DAY   ONETOUCH VERIO test strip USE 1 STRIP TO CHECK GLUCOSE ONCE  DAILY   vitamin B-12 (CYANOCOBALAMIN) 500 MCG tablet Take 500 mcg by mouth daily.   [DISCONTINUED] amLODipine (NORVASC) 10 MG tablet Take 1 tablet (10 mg total) by mouth daily.   [DISCONTINUED] furosemide (LASIX) 40 MG tablet Take 1 tablet (40 mg total) by mouth daily.   [DISCONTINUED] insulin degludec (TRESIBA FLEXTOUCH) 100 UNIT/ML FlexTouch Pen Inject 15 Units into the skin daily after supper.   [DISCONTINUED] lipase/protease/amylase (CREON) 36000 UNITS CPEP capsule Take 2 capsules with each meal three times daily  Take 1 capsule with each snack twice daily   [DISCONTINUED] losartan (COZAAR) 50 MG tablet Take 1 tablet (50 mg total) by mouth daily.   [DISCONTINUED] metoprolol tartrate (LOPRESSOR) 25 MG tablet Take 25 mg by mouth 2 (two) times daily. Take 1.5 tablets(37.5 mg total) by mouth 2 (two) times daily.   [DISCONTINUED] Metoprolol Tartrate 37.5 MG TABS Take 1.5 tablets by mouth 2 (two) times daily.   rosuvastatin (CRESTOR) 20 MG tablet Take 1 tablet (20 mg total) by mouth daily.   No facility-administered encounter medications on file as of 05/30/2021.     Review of Systems  Review of Systems  N/a Physical Exam  BP 128/74 (BP Location: Left Arm, Patient Position: Sitting, Cuff Size: Normal)   Pulse 89   Temp 98.8 F (37.1 C) (Oral)   Ht _0  (1.6 m)   Wt 160 lb 12.8 oz (72.9 kg)   SpO2 98%   BMI 28.48 kg/m   Wt Readings from Last 5 Encounters:  07/27/21 154 lb 9.6 oz (70.1 kg)  07/26/21 153 lb 9.6 oz (69.7 kg)  07/16/21 164 lb 7.4 oz (74.6 kg)  07/11/21 163 lb 6.4 oz (74.1 kg)  06/12/21  163 lb (73.9 kg)    BMI Readings from Last 5 Encounters:  07/27/21 27.39 kg/m  07/26/21 27.21 kg/m  07/16/21 29.13 kg/m  07/11/21 28.95 kg/m  06/12/21 28.87 kg/m     Physical Exam General: Well-appearing, no acute distress Eyes: EOMI, no icterus Pulmonary: Clear, normal breathing, no crackles Cardiovascular: Regular rate and rhythm, no murmurs MSK: No synovitis, no  joint effusion Neuro: Normal gait, no weakness Psych: Normal mood, full affect   Assessment & Plan:   Chronic hypoxemic respiratory failure: Suspect related to emphysema seen on CT scan.  Possible basilar fibrosis could not appear classic for UIP but no other clear pattern.  Possibly due to recurrent aspiration in setting of long history of GERD.  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  Also suspect contribution of VQ mismatch due to recently discovered likely pulmonary hypertension on TTE.  It was reiterated again how she should be using oxygen.  POC order was placed at last visit.  Has not received yet.  She was educated on the necessity of keeping oxygen saturation 92% or greater.  She may need to increase or decrease her oxygen to meet this goal.  Presumed pulmonary hypertension: Based on TTE with significant findings of RV enlargement, mildly reduced RV function, mildly dilated RA all new compared to 2021.  Suspect largely driven by group 3 disease and hypoxemia she is not using oxygen as prescribed.  Possible group 2 disease given diastolic dysfunction although not sure how big contributor this is.  No history of VTE/PE to suggest group 4 disease.  Will obtain PSG for further evaluation and possible treatment of group 3 disease.  Continue Lasix.   Return in about 3 months (around 08/29/2021).   Lanier Clam, MD 08/07/2021

## 2021-08-08 ENCOUNTER — Encounter (HOSPITAL_COMMUNITY): Payer: No Typology Code available for payment source

## 2021-08-10 ENCOUNTER — Ambulatory Visit (INDEPENDENT_AMBULATORY_CARE_PROVIDER_SITE_OTHER): Payer: No Typology Code available for payment source | Admitting: Pulmonary Disease

## 2021-08-10 ENCOUNTER — Encounter: Payer: Self-pay | Admitting: Pulmonary Disease

## 2021-08-10 ENCOUNTER — Telehealth: Payer: Self-pay | Admitting: Acute Care

## 2021-08-10 VITALS — BP 122/68 | HR 80 | Temp 98.2°F | Wt 150.0 lb

## 2021-08-10 DIAGNOSIS — I2721 Secondary pulmonary arterial hypertension: Secondary | ICD-10-CM

## 2021-08-10 DIAGNOSIS — R69 Illness, unspecified: Secondary | ICD-10-CM | POA: Diagnosis not present

## 2021-08-10 NOTE — Progress Notes (Signed)
_0  ID: Jessica Hart, female    DOB: 1940-11-21, 81 y.o.   MRN: [id number]  Chief Complaint  Patient presents with   Follow-up    Pt is here for follow up for pulm htn. Pt states that she is doing ok since last office visit. She states she is having a runny nose ad needs humidification for oxygen and new 5L concentrator per home health. And a softer nasal cannula.     Referring provider: Carlena Hurl, PA-C  HPI:   81 y.o. woman whom we are seeing in hospital follow up for volume overload and diagnosis of pulmonary HTN on RHC.  Reports stable weight. Discharged at ~160 pounds. Down to 150-152. Reports good adherence to lasix. Using O2. Needing 4L continuous. Reports O2 doing ok on this. Reviewed RHC that showed pulmonary HTN, normal wedge, preserved CO and CI. 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  HPI at initial visit: Was in usual state of health.  Was seen PCP and routine follow-up.  Noted to be hypoxemic work-up, rooming.  Noted little bit of cough for 3 days.  She was placed on 1 L of oxygen.  Came up to 92%.  She was placed on Trelegy.  She was referred to pulmonary.  Home oxygen was delivered.  She represented to PCP about 10 days later.  Noted to be at 87% on 1 L with exertion although no changes were made to oxygen prescription.  She denies significant dyspnea, nothing too significant.  Able to do tasks he needs to do.  Reviewed multiple CT scans that shows mild emphysematous changes primarily in the upper lobes with interlobular septal thickening and bronchiectasis in bilateral lower lobes without clear honeycombing on my interpretation.  PMH: Hypertension, diabetes, hyperlipidemia, tobacco abuse in remission surgical history: Colon surgery, hysterectomy Family history: CAD in first relatives, no significant [name] is a 81 year old with Social history: Former smoker, 45-pack-year, quit 2008, retired, lives with husband  Restaurant manager, fast food / Pulmonary Flowsheets:   ACT:      No data to display           MMRC:     No data to display           Epworth:      No data to display           Tests:   FENO:  No results found for: "NITRICOXIDE"  PFT:    Latest Ref Rng & Units 04/06/2021    3:35 PM  PFT Results  FVC-Pre L 1.74   FVC-Predicted Pre % 89   FVC-Post L 1.64   FVC-Predicted Post % 84   Pre FEV1/FVC % % 83   Post FEV1/FCV % % 84   FEV1-Pre L 1.44   FEV1-Predicted Pre % 96   FEV1-Post L 1.37   DLCO uncorrected ml/min/mmHg 6.61   DLCO UNC% % 36   DLVA Predicted % 55   TLC L 3.72   TLC % Predicted % 75   RV % Predicted % 60   Personally reviewed interpret spirometry suggestive of moderate to mild restriction versus gas trapping, no bronchodilator response, no fixed obstruction, TLC moderately reduced, DLCO severely reduced  WALK:      No data to display           Imaging: Personally reviewed and as per EMR discussion of this note VAS [place] UPPER EXTREMITY VENOUS DUPLEX  Result Date: 07/15/2021 UPPER VENOUS STUDY  Patient  Name:  Jessica Hart  Date of Exam:   07/14/2021 Medical Rec #: [id number]            Accession #:    [id number] Date of Birth: Aug 17, 1940            Patient Gender: F Patient Age:   37 years Exam Location:  [facility] Procedure:      VAS [place] UPPER EXTREMITY VENOUS DUPLEX Referring Phys: Vikki Ports --------------------------------------------------------------------------------  Indications: Pain, knotting, and Swelling in right neck status post radial cath. Swelling and pain left neck post line placement and removal. Limitations: Breathing, constant movement. Patient also on 9 liters of oxygen. Comparison Study: No prior study Performing Technologist: Sharion Dove RVS Supporting Technologist: Rogelia Rohrer RVT, RDMS  Examination Guidelines: A complete evaluation includes B-mode imaging, spectral Doppler, color Doppler, and power  Doppler as needed of all accessible portions of each vessel. Bilateral testing is considered an integral part of a complete examination. Limited examinations for reoccurring indications may be performed as noted.  Right Findings: +----------+------------+---------+-----------+----------+-------+ RIGHT     CompressiblePhasicitySpontaneousPropertiesSummary +----------+------------+---------+-----------+----------+-------+ IJV                      Yes       Yes                      +----------+------------+---------+-----------+----------+-------+ Subclavian               Yes       Yes                      +----------+------------+---------+-----------+----------+-------+  Left Findings: +----------+------------+---------+-----------+----------+-------------------+ LEFT      CompressiblePhasicitySpontaneousProperties      Summary       +----------+------------+---------+-----------+----------+-------------------+ IJV           Full       Yes       Yes              Rouleaux flow noted +----------+------------+---------+-----------+----------+-------------------+ Subclavian               Yes       Yes                                  +----------+------------+---------+-----------+----------+-------------------+  Summary:  Right: There is no DVT noted in the right IJV or subclavian vein. 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 Unable to locate the neck of the pseudoaneurysm secondary to breathing and movement.  Left: No DVT noted in the IJV or the subclavian vein.  *See table(s) above for measurements and observations.  Diagnosing physician: Orlie Pollen Electronically signed by Orlie Pollen on 07/15/2021 at 5:13:01 PM.    Final    CT Angio Chest Pulmonary Embolism (PE) W or WO Contrast  Result Date: 07/14/2021 CLINICAL DATA:  Pulmonary embolism (PE) suspected, high prob. Acute on  chronic hypoxemic respiratory failure. Lower extremity edema. EXAM: CT ANGIOGRAPHY CHEST WITH CONTRAST TECHNIQUE: Multidetector CT imaging of the chest was performed using the standard protocol during bolus administration of intravenous contrast. Multiplanar CT image reconstructions and MIPs were obtained to evaluate the vascular anatomy. RADIATION DOSE REDUCTION: This exam was performed according to the departmental dose-optimization program which includes automated exposure control, adjustment of the mA and/or kV according to patient size  and/or use of iterative reconstruction technique. CONTRAST:  5m OMNIPAQUE IOHEXOL 350 MG/ML SOLN COMPARISON:  05/19/2021 FINDINGS: Cardiovascular: There is adequate opacification the pulmonary arterial tree. No intraluminal filling defect identified to suggest acute pulmonary embolism. The central pulmonary arteries are minimally dilated suggesting changes of pulmonary arterial hypertension. Coronary artery bypass grafting has been performed. There is global cardiomegaly with particular enlargement of the right ventricle and right atrium with reflux of contrast into the hepatic venous system suggesting elevated right heart pressure and at least some degree of right heart failure. No pericardial effusion. Extensive atherosclerotic calcification within the thoracic aorta. No aortic aneurysm. Particularly prominent atherosclerotic calcification noted at the origin of the left common carotid artery, however, the degree of stenosis is not well assessed on this examination due to phase of contrast administration. Mediastinum/Nodes: Visualized thyroid is unremarkable. Borderline mediastinal adenopathy within the prevascular, right hilar, and subcarinal lymph node groups appears stable since prior examination, nonspecific. The esophagus is unremarkable. Small hiatal hernia. Lungs/Pleura: Subtle diffuse ground-glass pulmonary infiltrate, interlobular septal thickening, and trace right  pleural effusion is present, new since prior examination in keeping with pulmonary edema and likely reflecting changes of at least mild cardiogenic failure. Superimposed minimal subpleural architectural distortion with traction bronchiolectasis best appreciated within the posterior basal segments of the lower lobes bilaterally is stable since prior examination in keeping with underlying interstitial lung disease. No pneumothorax. Central airways are widely patent. Upper Abdomen: Trace ascites, new since prior examination. No acute abnormality. Musculoskeletal: No acute bone abnormality. Review of the MIP images confirms the above findings. IMPRESSION: No pulmonary embolism. Morphologic changes in keeping with elevated pulmonary and right heart pressure. Findings in keeping with at least mild cardiogenic failure with mild interstitial and alveolar pulmonary edema, small right pleural effusion, mild ascites, and prominent reflux of contrast into the hepatic venous system. Stable borderline mediastinal adenopathy, nonspecific. This may be reactive in the setting of cardiogenic failure. Stable chronic interstitial lung changes. Status post coronary artery bypass grafting. Aortic atherosclerosis with prominent atherosclerotic calcification involving the arch vasculature. If there is evidence of cerebrovascular insufficiency, CT arteriography may be more helpful for further evaluation. Aortic Atherosclerosis (ICD10-I70.0). Electronically Signed   By: AFidela SalisburyM.D.   On: 07/14/2021 20:06   CARDIAC CATHETERIZATION  Result Date: 07/13/2021 RHC: Right atrial pressure 23/22 mmHg ith a mean of 17 mmHg Right ventricular pressure of 67/-2 mmHg with a right ventricular end-diastolic pressure of 21 mmHg Pulmonary artery pressure of 65/27 mmHg with a mean pulmonary artery pressure 44 mmHg Left ventricular end-diastolic pressure of 4 mmHg SVC oxygen saturation of 61% Pulmonary artery oxygen saturation 59% Arterial oxygen  saturation of 92% Fick cardiac output of 4.18 L/min Fick cardiac index of 2.35 L/min If the left ventricular end-diastolic pressure is taken as surrogate for the the left atrial pressure the pulmonary vascular resistance is approximately 10 Woods units (see technique section for further details as wedge pressure could not be obtained). Conclusion:  Right heart catheterization with pulmonary arterial hypertension with a mean pulmonary artery pressure of 44 mmHg and a pulmonary vascular resistance of approximately 10 Wood units.  DG Chest 1 View  Result Date: 07/12/2021 CLINICAL DATA:  Shortness of breath with swelling. EXAM: CHEST  1 VIEW COMPARISON:  02/08/2021. FINDINGS: Chronic interstitial opacities. No confluent consolidation. No visible pleural effusions or pneumothorax. Enlarged cardiac silhouette. Median sternotomy and CABG. Polyarticular degenerative change. IMPRESSION: 1. Chronic interstitial opacities, probably in part due to the interstitial lung disease seen on  prior CT chest. Superimposed mild interstitial edema is not excluded. 2. Cardiomegaly. Electronically Signed   By: Margaretha Sheffield M.D.   On: 07/12/2021 16:33    Lab Results: Personally reviewed, no anemia, no polycythemia CBC    Component Value Date/Time   WBC 8.5 07/13/2021 0300   RBC 4.80 07/13/2021 0300   HGB 14.3 07/13/2021 1649   HGB 14.5 02/08/2021 1205   HCT 42.0 07/13/2021 1649   HCT 45.6 02/08/2021 1205   PLT 239 07/13/2021 0300   PLT 286 02/08/2021 1205   MCV 85.4 07/13/2021 0300   MCV 85 02/08/2021 1205   MCH 27.9 07/13/2021 0300   MCHC 32.7 07/13/2021 0300   RDW 16.6 (H) 07/13/2021 0300   RDW 14.6 02/08/2021 1205   LYMPHSABS 1.3 07/13/2021 0300   LYMPHSABS 1.9 02/08/2021 1205   MONOABS 0.7 07/13/2021 0300   EOSABS 0.2 07/13/2021 0300   EOSABS 0.1 02/08/2021 1205   BASOSABS 0.1 07/13/2021 0300   BASOSABS 0.1 02/08/2021 1205    BMET    Component Value Date/Time   NA 140 07/27/2021 1134   K 4.3  07/27/2021 1134   CL 100 07/27/2021 1134   CO2 21 07/27/2021 1134   GLUCOSE 193 (H) 07/27/2021 1134   GLUCOSE 100 (H) 07/17/2021 0300   BUN 18 07/27/2021 1134   CREATININE 0.99 07/27/2021 1134   CREATININE 0.69 01/10/2017 1235   CALCIUM 10.0 07/27/2021 1134   GFRNONAA >60 07/17/2021 0300   GFRNONAA 86 01/10/2017 1235   GFRAA 97 02/02/2020 1108   GFRAA 99 01/10/2017 1235    BNP    Component Value Date/Time   BNP 727.3 (H) 07/12/2021 1546    ProBNP No results found for: "PROBNP"  Specialty Problems       Pulmonary Problems   SOB (shortness of breath)   Pulmonary emphysema (HCC)   Pulmonary nodules    Allergies  Allergen Reactions   Lisinopril Itching and Cough    Immunization History  Administered Date(s) Administered   Fluad Quad(high Dose 65+) 11/11/2018   Influenza Whole 12/11/2007   Influenza, High Dose Seasonal PF 02/29/2016, 01/10/2017, 11/15/2017   Influenza,inj,Quad PF,6+ Mos 10/29/2012, 04/06/2014, 02/19/2015   PFIZER(Purple Top)SARS-COV-2 Vaccination 04/22/2019, 05/13/2019, 01/30/2020   Pneumococcal Conjugate-13 04/06/2014   Pneumococcal Polysaccharide-23 12/11/2007   Td 05/28/2004    Past Medical History:  Diagnosis Date   Arthritis    Cataract bilaterl 3 years ago   Coronary artery disease    Diabetes mellitus 02/27/2005   Diabetes mellitus type 2, controlled, without complications ([place]) 06/15/6220   Qualifier: Diagnosis of  By: Drucie Ip     Diverticulosis    Dyspnea    Echocardiogram findings abnormal, without diagnosis 2005   EF 47%, no ischemia, no infarction   Former smoker 01/10/2017   45 pack year history, quit in 2007    GERD (gastroesophageal reflux disease)    Heart murmur    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 (Patient taking differently: Take 1,000 mg by mouth 2 (two) times daily.)   aspirin EC 81 MG tablet Take 1 tablet (81 mg total) by mouth daily. Swallow whole.   Cholecalciferol (D3 5000) 125 MCG (5000 UT) capsule Take 5,000 Units by mouth daily.   clotrimazole-betamethasone (LOTRISONE) cream Apply 1 application topically daily.   CREON 36000-114000 units CPEP capsule TAKE 2 CAPSULES BY MOUTH 3 TIMES A DAY WITH MEALS AND TAKE 1 CAPSULE WITH EACH SNACK TWICE DAILY   dapagliflozin propanediol (FARXIGA) 5 MG TABS tablet Take 1 tablet (5 mg total) by mouth daily.   Fluticasone-Umeclidin-Vilant (TRELEGY ELLIPTA) 100-62.5-25 MCG/ACT AEPB Inhale 1 puff into the lungs daily.   furosemide (LASIX) 20 MG tablet Take 3 tablets (60 mg total) by mouth daily.   insulin degludec (TRESIBA FLEXTOUCH) 100 UNIT/ML FlexTouch Pen Inject 15 Units into the skin daily after supper.   Insulin Pen Needle (PEN NEEDLES 5/16") 30G X 8 MM MISC Pen needles used for lantus injections   Insulin Pen Needle (PEN NEEDLES) 32G X 4 MM MISC 1 each by Other route daily.   losartan (COZAAR) 50 MG tablet TAKE 1 TABLET BY MOUTH EVERY DAY   metFORMIN (GLUCOPHAGE-XR) 750 MG 24 hr tablet TAKE 1 TABLET BY MOUTH EVERY DAY WITH BREAKFAST   metoprolol tartrate (LOPRESSOR) 25 MG tablet Take 37.5 mg by mouth 2 (two) times daily.   Misc. Devices MISC SIG: 1 L of Oxygen via Nasal cannula around the clock Dispense Home Oxygen and Portable tank and necessary supplies. Dx: Hypoxemia, Shortness of breath (Patient taking differently: SIG: 3.5 L of Oxygen via Nasal cannula around  the clock Dispense Home Oxygen and Portable tank and necessary supplies. Dx: Hypoxemia, Shortness of breath)   Multiple Vitamin (MULTIVITAMIN WITH MINERALS) TABS tablet Take 1 tablet by mouth daily.   Nutritional Supplements (,FEEDING SUPPLEMENT, PROSOURCE PLUS) liquid Take 30 mLs by mouth 2 (two) times daily between meals.   omeprazole (PRILOSEC) 40 MG capsule TAKE 1 CAPSULE BY MOUTH EVERY DAY (Patient taking differently: Take 40 mg by mouth daily. TAKE 1 CAPSULE BY MOUTH EVERY DAY)   OneTouch Delica Lancets 16X MISC USE TO TEST BLOOD SUGAR ONCE A DAY   ONETOUCH VERIO test strip USE 1 STRIP TO CHECK GLUCOSE ONCE DAILY   potassium chloride (KLOR-CON) 10 MEQ tablet Take 2 tablets (20 mEq total) by mouth daily.   rosuvastatin (CRESTOR) 20 MG tablet Take 1 tablet (20 mg total) by mouth daily.   vitamin B-12 (CYANOCOBALAMIN) 500 MCG tablet Take 500 mcg by mouth daily.   No facility-administered encounter medications on file as of 08/10/2021.     Review of Systems  Review of Systems  N/a Physical Exam  BP 122/68 (BP Location: Left Arm, Patient Position: Sitting, Cuff Size: Normal)   Pulse 80   Temp 98.2 F (36.8 C) (Oral)   Wt 150 lb (68 kg)   SpO2 96%   BMI 26.57 kg/m   Wt Readings from Last 5 Encounters:  08/10/21 150 lb (68 kg)  07/27/21 154 lb 9.6 oz (70.1 kg)  07/26/21 153 lb 9.6 oz (69.7 kg)  07/16/21 164 lb 7.4 oz (74.6 kg)  07/11/21 163 lb 6.4 oz (74.1 kg)    BMI Readings from Last 5 Encounters:  08/10/21 26.57 kg/m  07/27/21 27.39 kg/m  07/26/21 27.21 kg/m  07/16/21  29.13 kg/m  07/11/21 28.95 kg/m     Physical Exam General: Well-appearing, no acute distress Eyes: EOMI, no icterus Pulmonary: Clear, normal breathing, no crackles Cardiovascular: Regular rate and rhythm, no murmurs MSK: No synovitis, no joint effusion Neuro: Normal gait, no weakness Psych: Normal mood, full affect   Assessment & Plan:   Chronic hypoxemic respiratory failure: Suspect  related to emphysema seen on CT scan.  Possible basilar fibrosis could not appear classic for UIP but no other clear pattern.  Possibly due to recurrent aspiration in setting of long history of GERD.  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  Also suspect contribution of VQ mismatch due to recently discovered likely pulmonary hypertension on TTE.  It was reiterated again how she should be using oxygen.  She expressed understanding.  Although is not totally clear she is using appropriately at all times.  Stressed keeping oxygen saturation above 90%, ideally greater than 92%.  Continue nocturnal oxygen, 4 L.  Pulmonary hypertension: Based on TTE with significant findings of RV enlargement, mildly reduced RV function, mildly dilated RA all new compared to 2021.  Confirmed on right heart cath 06/2021 with normal LVEDP, preserved CO and CI.  Suspect largely driven by group 3 disease and hypoxemia and nocturnal hypoxia via as historically she has not used oxygen as prescribed.  No OSA on PSG.  Possible group 2 disease given diastolic dysfunction although not sure how big contributor this is.  No history of VTE/PE to suggest group 4 disease.    Continue Lasix.  Long and frank discussion with patient.  She is not a very good candidate for pulmonary vasodilators given the multifactorial nature of disease, significant emphysema on CT scan, demonstration of some lack of understanding of instructions in the past.  Given her significant emphysema, inhaled vasodilators have not demonstrated effectiveness in the past trials.  Systemic vasodilators pose significant risk of VQ mismatch.  High suspicion that point was for worsening condition.  This was shared with patient and husband today.  At this time after shared decision making, elect not to pursue coronary artery vasodilators.  Stressed importance of maintaining good weight, appears 152 pounds is reasonable.  150 pounds today.  Lasix as mainstay of therapy at  this point.   Return in about 6 weeks (around 09/21/2021).   Lanier Clam, MD 08/10/2021  I spent 45 minutes in the care of the patient including face-to-face visit, review of records, coordination of care.

## 2021-08-10 NOTE — Telephone Encounter (Signed)
This patient had a LDCT 01/2021. There were 4 nodules with a LR 3 score. The subsequent HRCT the patient had 05/21/2021 and the CTA she had 07/14/2021 do not comment on lung nodules. Lets repeat her low dose CT Chest in 3 months ( 10/2021)  Thanks so much

## 2021-08-10 NOTE — Patient Instructions (Addendum)
Nice to see you  We will order humidified oxygen and a rollator walker  Keep oxygen up, ideally over 90%  Continue oxygen 4L at night  Keep weight at 152 pounds or less, if weight increased by 2 pounds in a day or 5 pounds in a week please call us for further instruction  Continue lasix. I do not think additional medications for pulmonary hypertension are a good idea.   Return to clinic in 6 weeks or sooner as needed

## 2021-08-11 LAB — BRAIN NATRIURETIC PEPTIDE: Pro B Natriuretic peptide (BNP): 1314 pg/mL — ABNORMAL HIGH (ref 0.0–100.0)

## 2021-08-11 NOTE — Telephone Encounter (Signed)
Notified patient by phone that recommendation is to move next CT Chest to September 2023 instead of June 2023 since patient had recent CT imaging.  Will call her closer to Sept 2023 to schedule. Patient acknowledged understanding.

## 2021-08-15 ENCOUNTER — Ambulatory Visit: Payer: No Typology Code available for payment source

## 2021-08-16 ENCOUNTER — Encounter (HOSPITAL_COMMUNITY): Payer: Self-pay | Admitting: Internal Medicine

## 2021-08-16 ENCOUNTER — Ambulatory Visit (HOSPITAL_COMMUNITY)
Admission: RE | Admit: 2021-08-16 | Discharge: 2021-08-16 | Disposition: A | Discharge status: Home / Self Care | Payer: No Typology Code available for payment source | Source: Ambulatory Visit | Attending: Internal Medicine | Admitting: Internal Medicine

## 2021-08-16 VITALS — BP 122/80 | HR 80 | Wt 153.2 lb

## 2021-08-16 DIAGNOSIS — E119 Type 2 diabetes mellitus without complications: Secondary | ICD-10-CM | POA: Insufficient documentation

## 2021-08-16 DIAGNOSIS — Z7984 Long term (current) use of oral hypoglycemic drugs: Secondary | ICD-10-CM | POA: Diagnosis not present

## 2021-08-16 DIAGNOSIS — I272 Pulmonary hypertension, unspecified: Secondary | ICD-10-CM | POA: Insufficient documentation

## 2021-08-16 DIAGNOSIS — Z794 Long term (current) use of insulin: Secondary | ICD-10-CM | POA: Diagnosis not present

## 2021-08-16 DIAGNOSIS — K219 Gastro-esophageal reflux disease without esophagitis: Secondary | ICD-10-CM | POA: Diagnosis not present

## 2021-08-16 DIAGNOSIS — Z8249 Family history of ischemic heart disease and other diseases of the circulatory system: Secondary | ICD-10-CM | POA: Insufficient documentation

## 2021-08-16 DIAGNOSIS — Z87891 Personal history of nicotine dependence: Secondary | ICD-10-CM | POA: Diagnosis not present

## 2021-08-16 DIAGNOSIS — E785 Hyperlipidemia, unspecified: Secondary | ICD-10-CM | POA: Diagnosis not present

## 2021-08-16 DIAGNOSIS — Z951 Presence of aortocoronary bypass graft: Secondary | ICD-10-CM | POA: Diagnosis not present

## 2021-08-16 DIAGNOSIS — Z79899 Other long term (current) drug therapy: Secondary | ICD-10-CM | POA: Insufficient documentation

## 2021-08-16 DIAGNOSIS — Z9981 Dependence on supplemental oxygen: Secondary | ICD-10-CM | POA: Insufficient documentation

## 2021-08-16 DIAGNOSIS — I110 Hypertensive heart disease with heart failure: Secondary | ICD-10-CM | POA: Diagnosis not present

## 2021-08-16 DIAGNOSIS — I2721 Secondary pulmonary arterial hypertension: Secondary | ICD-10-CM

## 2021-08-16 DIAGNOSIS — I2510 Atherosclerotic heart disease of native coronary artery without angina pectoris: Secondary | ICD-10-CM | POA: Diagnosis not present

## 2021-08-16 DIAGNOSIS — Z7982 Long term (current) use of aspirin: Secondary | ICD-10-CM | POA: Diagnosis not present

## 2021-08-16 DIAGNOSIS — J439 Emphysema, unspecified: Secondary | ICD-10-CM | POA: Insufficient documentation

## 2021-08-16 DIAGNOSIS — J9611 Chronic respiratory failure with hypoxia: Secondary | ICD-10-CM | POA: Diagnosis not present

## 2021-08-16 DIAGNOSIS — I5032 Chronic diastolic (congestive) heart failure: Secondary | ICD-10-CM | POA: Insufficient documentation

## 2021-08-16 NOTE — Progress Notes (Signed)
Advanced Heart Failure Clinic Note   Referring Physician: PCP: Carlena Hurl, PA-C PCP-Cardiologist: Elouise Munroe, MD   HPI:  81 y.o. female with history of CAD s/p CABG X 3 (LIMA to LAD, SVG to OM, SVG to PDA) in 07/21, pulmonary hypertension, chronic diastolic CHF, chronic respiratory failure with hypoxia, DM, HTN, HLD, prior tobacco use.   She has followed with Dr. Silas Flood for chronic hypoxemic respiratory failure and pulmonary hypertension.   Echo 01/23: EF 65-70%, moderate LVH, grade I DD, RV moderately enlarged with mildly reduced function, RVSP 67 mmHg  Admitted 05/23 with a/c hypoxic respiratory failure and a/c diastolic CHF. Diuresed with IV lasix then transitioned to po furosemide 60 mg daily. CTA chest no PE but did show evidence of pulmonary edema and chronic lung disease. RHC RA mean 17, PA 65/27 (44), LVEDP 4, unable to measure PCWP, PVR 10 WU, Fick CO/CI 4.18/2.35.  She was referred to [facility] at discharge.   She saw Dr. Silas Flood for f/u on 06/14. Pulmonary hypertension felt to be predominately group 3 (emphysema and nocturnal hypoxemia) with possible group 2 component.  Not felt to be great candidate for pulmonary vasodilators. Risk of V/Q mismatch with systemic vasodilators given her underlying emphysema. Adherence with O2 and diuretics stressed. Has not always been adherent with oxygen therapy.  She reports significant decline in activity tolerance over the last 6 months. Gets winded walking less than a block. Currently using 4.5-5L O2 continuously. No orthopnea or PND. LE edema better since hospital discharge. Consistently weighing 150 lb at home. She does not add salt to meals  but does drink a lot of fluids.      Review of Systems: [y] = yes, _0  = no   General: Weight gain _1 ; Weight loss _2 ; Anorexia _3 ; Fatigue [Y]; Fever _4 ; Chills _5 ; Weakness _6   Cardiac: Chest pain/pressure _7 ; Resting SOB _8 ; Exertional SOB [Y];  Orthopnea _9 ; Pedal Edema [Y]; Palpitations _10 ; Syncope _11 ; Presyncope _12 ; Paroxysmal nocturnal dyspnea_13   Pulmonary: Cough _14 ; Wheezing_15 ; Hemoptysis_16 ; Sputum _17 ; Snoring [Y]  GI: Vomiting_18 ; Dysphagia_19 ; Melena_20 ; Hematochezia _21 ; Heartburn_22 ; Abdominal pain _23 ; Constipation _24 ; Diarrhea _25 ; BRBPR _26   GU: Hematuria_27 ; Dysuria _28 ; Nocturia_29   Vascular: Pain in legs with walking _30 ; Pain in feet with lying flat _31 ; Non-healing sores _32 ; Stroke _33 ; TIA _34 ; Slurred speech _35 ;  Neuro: Headaches_36 ; Vertigo_37 ; Seizures_38 ; Paresthesias_39 ;Blurred vision _40 ; Diplopia _41 ; Vision changes _42   Ortho/Skin: Arthritis _43 ; Joint pain _44 ; Muscle pain _45 ; Joint swelling _46 ; Back Pain _47 ; Rash _48   Psych: Depression_49 ; Anxiety_50   Heme: Bleeding problems _51 ; Clotting disorders _52 ; Anemia _53   Endocrine: Diabetes [Y]; Thyroid dysfunction_54    Past Medical History:  Diagnosis Date   Arthritis    Cataract bilaterl 3 years ago   Coronary artery disease    Diabetes mellitus 02/27/2005   Diabetes mellitus type 2, controlled, without complications ([place]) 5/79/0383   Qualifier: Diagnosis of  By: Drucie Ip     Diverticulosis    Dyspnea    Echocardiogram findings abnormal, without diagnosis 2005   EF 47%, no ischemia, no infarction   Former smoker 01/10/2017   45 pack year history, quit in 2007  GERD (gastroesophageal reflux disease)    Heart murmur    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   History of bone density study 2006   Lt hip = -1.0, L spine = -1.8    Hyperlipidemia    Hypertension     Current Outpatient Medications  Medication Sig Dispense Refill   acetaminophen (TYLENOL) 500 MG tablet Take 2 tablets (1,000 mg total) by mouth every 8 (eight) hours as needed. 30 tablet 2   aspirin EC 81 MG tablet Take 1 tablet (81 mg total) by mouth daily. Swallow whole. 90 tablet 3   Cholecalciferol (D3 5000) 125 MCG (5000 UT) capsule Take 5,000 Units by mouth daily.      clotrimazole-betamethasone (LOTRISONE) cream Apply 1 application topically daily. 30 g 0   CREON 36000-114000 units CPEP capsule TAKE 2 CAPSULES BY MOUTH 3 TIMES A DAY WITH MEALS AND TAKE 1 CAPSULE WITH EACH SNACK TWICE DAILY 240 capsule 11   dapagliflozin propanediol (FARXIGA) 5 MG TABS tablet Take 1 tablet (5 mg total) by mouth daily. 90 tablet 1   Fluticasone-Umeclidin-Vilant (TRELEGY ELLIPTA) 100-62.5-25 MCG/ACT AEPB Inhale 1 puff into the lungs daily. 1 each 2   furosemide (LASIX) 20 MG tablet Take 3 tablets (60 mg total) by mouth daily. 90 tablet 0   insulin degludec (TRESIBA FLEXTOUCH) 100 UNIT/ML FlexTouch Pen Inject 15 Units into the skin daily after supper. 3 mL 5   Insulin Pen Needle (PEN NEEDLES 5/16") 30G X 8 MM MISC Pen needles used for lantus injections 100 each 3   Insulin Pen Needle (PEN NEEDLES) 32G X 4 MM MISC 1 each by Other route daily. 100 each 1   losartan (COZAAR) 50 MG tablet TAKE 1 TABLET BY MOUTH EVERY DAY 90 tablet 1   metFORMIN (GLUCOPHAGE-XR) 750 MG 24 hr tablet TAKE 1 TABLET BY MOUTH EVERY DAY WITH BREAKFAST 90 tablet 1   metoprolol tartrate (LOPRESSOR) 25 MG tablet Take 37.5 mg by mouth 2 (two) times daily.     metoprolol tartrate (LOPRESSOR) 25 MG tablet Take 37.5 mg by mouth 2 (two) times daily.     Misc. Devices MISC SIG: 1 L of Oxygen via Nasal cannula around the clock Dispense Home Oxygen and Portable tank and necessary supplies. Dx: Hypoxemia, Shortness of breath (Patient taking differently: SIG: 3.5 L of Oxygen via Nasal cannula around the clock Dispense Home Oxygen and Portable tank and necessary supplies. Dx: Hypoxemia, Shortness of breath) 1 Device 0   Multiple Vitamin (MULTIVITAMIN WITH MINERALS) TABS tablet Take 1 tablet by mouth daily. 30 tablet 0   omeprazole (PRILOSEC) 40 MG capsule TAKE 1 CAPSULE BY MOUTH EVERY DAY 90 capsule 1   OneTouch Delica Lancets 16X MISC USE TO TEST BLOOD SUGAR ONCE A DAY 100 each 1   ONETOUCH VERIO test strip USE 1 STRIP TO  CHECK GLUCOSE ONCE DAILY 100 strip 1   potassium chloride (KLOR-CON) 10 MEQ tablet Take 2 tablets (20 mEq total) by mouth daily. 90 tablet 0   rosuvastatin (CRESTOR) 20 MG tablet Take 1 tablet (20 mg total) by mouth daily. 90 tablet 3   vitamin B-12 (CYANOCOBALAMIN) 500 MCG tablet Take 500 mcg by mouth daily.     No current facility-administered medications for this encounter.    Allergies  Allergen Reactions   Lisinopril Itching and Cough      Social History   Socioeconomic History   Marital status: Married    Spouse name: Richard   Number of children: 1   Years of  education: 12   Highest education level: High school graduate  Occupational History   Occupation: Retire-Textile    Employer: RETIRED  Tobacco Use   Smoking status: Former    Packs/day: 1.00    Years: 45.00    Total pack years: 45.00    Types: Cigarettes    Quit date: 07/27/2005    Years since quitting: 16.0   Smokeless tobacco: Former    Quit date: 03/2005  Vaping Use   Vaping Use: Never used  Substance and Sexual Activity   Alcohol use: Not Currently    Comment: beer occasionally    Drug use: No   Sexual activity: Not Currently    Partners: Male    Comment: 1st intercourse-22, partners- 1, married- 29 yrs   Other Topics Concern   Not on file  Social History Narrative   Health Care POA:    Emergency Contact: husband, Richard Ambulance person   End of Life Plan:    Who lives with you: Lives with husband   Any pets: gold fish   Diet: Patient has a varied diet and reports stuggeling with portion size and ice cream.   Exercise: Patient does not have currently exercise routine.   Seatbelts: Patient reports wearing seatbelt when in vehicle.   Sun Exposure/Protection: Patient reports not using sun protection.   Hobbies: Bingo         Social Determinants of Health   Financial Resource Strain: Low Risk  (03/11/2021)   Overall Financial Resource Strain (CARDIA)    Difficulty of Paying Living Expenses: Not hard  at all  Food Insecurity: No Food Insecurity (03/11/2021)   Hunger Vital Sign    Worried About Running Out of Food in the Last Year: Never true    Ran Out of Food in the Last Year: Never true  Transportation Needs: No Transportation Needs (03/11/2021)   PRAPARE - Hydrologist (Medical): No    Lack of Transportation (Non-Medical): No  Physical Activity: Inactive (03/11/2021)   Exercise Vital Sign    Days of Exercise per Week: 0 days    Minutes of Exercise per Session: 0 min  Stress: No Stress Concern Present (03/11/2021)   [place]    Feeling of Stress : Not at all  Social Connections: Not on file  Intimate Partner Violence: Not on file      Family History  Problem Relation Age of Onset   Heart disease Father    Cancer Sister    Tongue cancer Sister    Colon polyps Neg Hx    Rectal cancer Neg Hx    Stomach cancer Neg Hx     Vitals:   08/16/21 1425  BP: 122/80  Pulse: 80  SpO2: 95%  Weight: 69.5 kg (153 lb 3.2 oz)     PHYSICAL EXAM: General:  Elderly female, no distress. Arrived in wheelchair. HEENT: normal Neck: supple. no JVD. Carotids 2+ bilat; no bruits.  Cor: PMI nondisplaced. Regular rate & rhythm. No rubs, gallops or murmurs. Lungs: diminished throughout, on 5L O2 [place] Abdomen: soft, nontender, nondistended.  Extremities: no cyanosis, clubbing, rash, edema Neuro: alert & oriented x 3, cranial nerves grossly intact. moves all 4 extremities w/o difficulty. Affect pleasant.  EKG: SR 77 bpm, inferior and anterior Qs  ASSESSMENT & PLAN:  Pulmonary hypertension: - Echo 01/23: EF 65-70%, RV moderately enlarged with mildly reduced function, RVSP 67 mmHg - RHC 05/23: RA mean 17, PA 65/27 (  31), LVEDP 4, unable to measure PCWP, PVR 10 WU, Fick CO/CI 4.18/2.35. - Suspect primarily group 3 (emphysema + nocturnal hypoxemia) with possible group 2 component - No PE on prior CTA.  -  No significant OSA - Not an ideal candidate for pulmonary vasodilators. Could consider trial with sildenafil with the understanding she may feel worse after starting the medication. She would prefer to hold off on starting the medication for now.  - Volume status is table. Continue current dose of furosemide. - Continue to follow with Dr. Silas Flood.  2. Chronic diastolic CHF with RV failure: - NYHA III. Volume looks okay. On 60 mg furosemide daily. - On Farxiga 5 mg daily >> can consider increasing to 10 mg daily. Will leave to discretion of her Cardiologist. - Continue Losartan 50 mg daily - Cut back fluid intake.  3. Chronic respiratory failure with hypoxia: -Respiratory failure likely d/t combination of emphysema +/- possible early fibrosis and possible recurrent aspiration d/t longstanding GERD.  - Continue to follow with Dr. Silas Flood.  4. CAD:  - S/p CABG X 3 in 2021 - On aspirin and statin - Denies angina - Management per her Cardiologist, Dr. Monte Fantasia  Follow-up: As needed. Will continue to follow with Dr. Monte Fantasia and Dr. Silas Flood.  FINCH, LINDSAY N, PA-C 08/16/21   Patient seen and examined with the above-signed Advanced Practice Provider and/or Housestaff. I personally reviewed laboratory data, imaging studies and relevant notes. I independently examined the patient and formulated the important aspects of the plan. I have edited the note to reflect any of my changes or salient points. I have personally discussed the plan with the patient and/or family.  81 y/o with severe COPD, CAD s/p CABG, chronic diastolic HF referred by Dr. Margaretann Loveless for further evaluation of [place].   Previous notes as well as most recent echo, RHC , chest CT and PFT data all reviewed personally.   She reports NYHA III-IIIB symptoms. Recently saw Dr. Silas Flood and [facility] felt to be mostly WHO Groups 2&3 and not candidate for selective pulmonary artery vasodilators.   General:  Elderly woman sitting in WC on  O2. No resp difficulty HEENT: normal Neck: supple. no JVD. Carotids 2+ bilat; no bruits. No lymphadenopathy or thryomegaly appreciated. Cor: PMI nondisplaced. Regular rate & rhythm. No rubs, gallops or murmurs. Lungs: markedly decreased throughout Abdomen: soft, nontender, nondistended. No hepatosplenomegaly. No bruits or masses. Good bowel sounds. Extremities: no cyanosis, clubbing, rash, edema Neuro: alert & orientedx3, cranial nerves grossly intact. moves all 4 extremities w/o difficulty. Affect pleasant  Agree with Dr. Silas Flood that she is likely not predominantly WHO Group 1 PAH but PVR is quite high and FEV1 is not as bad as I expected based on CT images. We discussed her situation at length including the risks/benefits of starting a low-dose PDE-5i empirically and carefully assessing response with the understanding that this has a reasonable chance of actually making her feel worse. If tolerated might consider Tyvaso as a second-line agent.  For now she wants to defer. She will f/u with Dr. Silas Flood in a few months and can revisit then.   Glori Bickers, MD  5:59 PM

## 2021-08-16 NOTE — Patient Instructions (Signed)
Thank you for you visit today.  Your physician recommends that you schedule a follow-up appointment in: as needed.  If you have any questions or concerns before your next appointment please send [place] a message through [place] or call our office at [phone].    TO LEAVE A MESSAGE FOR THE NURSE SELECT OPTION 2, PLEASE LEAVE A MESSAGE INCLUDING: YOUR NAME DATE OF BIRTH CALL BACK NUMBER REASON FOR CALL**this is important as we prioritize the call backs  YOU WILL RECEIVE A CALL BACK THE SAME DAY AS LONG AS YOU CALL BEFORE 4:00 PM  At the [facility], you and your health needs are our priority. As part of our continuing mission to provide you with exceptional heart care, we have created designated Provider Care Teams. These Care Teams include your primary Cardiologist (physician) and Advanced Practice Providers (APPs- Physician Assistants and Nurse Practitioners) who all work together to provide you with the care you need, when you need it.   You may see any of the following providers on your designated Care Team at your next follow up: Dr Glori Bickers Dr Haynes Kerns, NP Lyda Jester, [place] [facility] [place], [place] Audry Riles, PharmD   Please be sure to bring in all your medications bottles to every appointment.

## 2021-08-17 ENCOUNTER — Telehealth: Payer: Self-pay | Admitting: Medical

## 2021-08-17 ENCOUNTER — Other Ambulatory Visit: Payer: Self-pay

## 2021-08-17 DIAGNOSIS — J439 Emphysema, unspecified: Secondary | ICD-10-CM | POA: Diagnosis not present

## 2021-08-17 MED ORDER — GLUCOSE BLOOD VI STRP: ORAL_STRIP | 3 refills | Status: DC

## 2021-08-17 MED ORDER — ONETOUCH ULTRASOFT LANCETS MISC: 3 refills | Status: DC

## 2021-08-17 NOTE — Telephone Encounter (Signed)
[facility] sent refill request for diabetic testing supplies lancets, test strips pen-needles  Prefers one touch verio stips Please send to the [id number]- [place], [place]

## 2021-08-19 ENCOUNTER — Other Ambulatory Visit: Payer: Self-pay | Admitting: Medical

## 2021-08-19 ENCOUNTER — Telehealth: Payer: Self-pay | Admitting: Medical

## 2021-08-19 ENCOUNTER — Telehealth: Payer: Self-pay

## 2021-08-19 MED ORDER — GLUCOSE BLOOD VI STRP: 1.0000 | ORAL_STRIP | Freq: Every day | 12 refills | Status: DC

## 2021-08-19 NOTE — Telephone Encounter (Signed)
I attempted to contact patient, unable to reach.  LVM to call back to let us know if okay to cancel the appointment.  Left call back number.

## 2021-08-22 ENCOUNTER — Ambulatory Visit: Payer: No Typology Code available for payment source | Admitting: Gastroenterology

## 2021-08-24 ENCOUNTER — Ambulatory Visit: Payer: No Typology Code available for payment source | Admitting: Internal Medicine

## 2021-08-24 NOTE — Telephone Encounter (Signed)
Seems like encounter was open in error so closing encounter.  

## 2021-08-25 NOTE — Telephone Encounter (Signed)
Order place . [place]

## 2021-08-26 ENCOUNTER — Other Ambulatory Visit: Payer: Self-pay | Admitting: Medical

## 2021-08-28 ENCOUNTER — Other Ambulatory Visit: Payer: Self-pay | Admitting: Internal Medicine

## 2021-08-28 DIAGNOSIS — E8779 Other fluid overload: Secondary | ICD-10-CM

## 2021-08-29 ENCOUNTER — Other Ambulatory Visit: Payer: Self-pay | Admitting: Medical

## 2021-08-29 DIAGNOSIS — E1165 Type 2 diabetes mellitus with hyperglycemia: Secondary | ICD-10-CM

## 2021-08-31 ENCOUNTER — Telehealth: Payer: Self-pay | Admitting: Medical

## 2021-08-31 NOTE — Telephone Encounter (Signed)
Pt states she needs more samples of Trelegy samples, ok per sabrina.  #2 given

## 2021-09-06 ENCOUNTER — Encounter: Payer: Self-pay | Admitting: Medical

## 2021-09-06 ENCOUNTER — Ambulatory Visit (INDEPENDENT_AMBULATORY_CARE_PROVIDER_SITE_OTHER): Payer: No Typology Code available for payment source | Admitting: Medical

## 2021-09-06 VITALS — BP 130/80 | HR 72 | Wt 150.0 lb

## 2021-09-06 DIAGNOSIS — I5032 Chronic diastolic (congestive) heart failure: Secondary | ICD-10-CM

## 2021-09-06 DIAGNOSIS — Z79899 Other long term (current) drug therapy: Secondary | ICD-10-CM | POA: Diagnosis not present

## 2021-09-06 DIAGNOSIS — I2510 Atherosclerotic heart disease of native coronary artery without angina pectoris: Secondary | ICD-10-CM

## 2021-09-06 DIAGNOSIS — J9611 Chronic respiratory failure with hypoxia: Secondary | ICD-10-CM | POA: Diagnosis not present

## 2021-09-06 DIAGNOSIS — Z951 Presence of aortocoronary bypass graft: Secondary | ICD-10-CM

## 2021-09-06 DIAGNOSIS — I10 Essential (primary) hypertension: Secondary | ICD-10-CM | POA: Diagnosis not present

## 2021-09-06 DIAGNOSIS — R55 Syncope and collapse: Secondary | ICD-10-CM

## 2021-09-06 DIAGNOSIS — I2721 Secondary pulmonary arterial hypertension: Secondary | ICD-10-CM

## 2021-09-06 NOTE — Progress Notes (Signed)
Subjective:  Jessica Hart is a 81 y.o. female who presents for Chief Complaint  Patient presents with   Acute Visit    Pt had to call the ambulance last Thursday due to her oxygen being low. She also had an ekg done     Here for acute visit for concern for recent call to EMS.  Accompanied by her son Cecilie Lowers ([place], [place]).  Medical team: Dr. Glori Bickers, heart failure clinic Dr. Larey Days, pulmonology Dr. Cherlynn Kaiser, cardiology Dr. Rutherford Guys, ophthalmology Dr. Hulan Saas, ortho Dr. Harl Bowie, GI Dr. Radene Journey, ENT Charidy Cappelletti, Camelia Eng, PA-C here with primary care  She has significant medical problems including chronic diastolic congestive heart failure, history of CABG x3, pulmonary hypertension, chronic respiratory failure with hypoxia, prior tobacco use, diabetes type 2, coronary artery disease, hypertension, hyperlipidemia  She just saw heart failure clinic on 08/16/2021.  She also sees pulmonology and cardiology  She is here with her son mainly today to discuss her overall health.  He is not familiar with her current health conditions and what the plan is going forward.  He does not get the come up as here all that often so he would like an idea of what is her health status and has questions about her oxygen and medication.  She says that she sometimes is not clear on what the plan is for her overall health care and wants him to be here to help make sense of at all.  She has questions about how much she can exercise.  She has questions about things that she can eat.  She wants to eat some sardines but has questions about whether she can eat this due to the salt  She is not exactly sure on how many ounces of fluid she is drinking daily.  She is drinking 2 bottles of water daily and some ginger ale and may be some other liquids.  When EMS was called in the night she was walking towards the bed and her husband states that she fell down to her knees  then blacked out for a few seconds.  She thinks she did not have her nasal cannula on correctly and was not getting adequate oxygen.  She does not recall any type of abnormal palpitation or chest pain.  EKG strip from EMS reviewed from 08/31/21, unchanged from 07/2021 EKG on file.  EMS encouraged her to go to the hospital tonight but she declined.  She has not had any more similar episodes.  She has felt at her baseline since then.  No other aggravating or relieving factors.    No other c/o.  Past Medical History:  Diagnosis Date   Arthritis    Cataract bilaterl 3 years ago   Coronary artery disease    Diabetes mellitus 02/27/2005   Diabetes mellitus type 2, controlled, without complications ([place]) 2/50/5397   Qualifier: Diagnosis of  By: Drucie Ip     Diverticulosis    Dyspnea    Echocardiogram findings abnormal, without diagnosis 2005   EF 47%, no ischemia, no infarction   Former smoker 01/10/2017   45 pack year history, quit in 2007    GERD (gastroesophageal reflux disease)    Heart murmur    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   History of bone density study 2006   Lt hip = -1.0, L spine = -1.8    Hyperlipidemia    Hypertension    Current Outpatient Medications on File Prior to Visit  Medication Sig  Dispense Refill   acetaminophen (TYLENOL) 500 MG tablet Take 2 tablets (1,000 mg total) by mouth every 8 (eight) hours as needed. 30 tablet 2   aspirin EC 81 MG tablet Take 1 tablet (81 mg total) by mouth daily. Swallow whole. 90 tablet 3   Cholecalciferol (D3 5000) 125 MCG (5000 UT) capsule Take 5,000 Units by mouth daily.     clotrimazole-betamethasone (LOTRISONE) cream Apply 1 application topically daily. 30 g 0   CREON 36000-114000 units CPEP capsule TAKE 2 CAPSULES BY MOUTH 3 TIMES A DAY WITH MEALS AND TAKE 1 CAPSULE WITH EACH SNACK TWICE DAILY 240 capsule 11   FARXIGA 5 MG TABS tablet TAKE 1 TABLET (5 MG TOTAL) BY MOUTH DAILY. 90 tablet 1   furosemide (LASIX) 20 MG tablet TAKE 3  TABLETS BY MOUTH EVERY DAY 90 tablet 0   glucose blood test strip Use as instructed 100 each 3   glucose blood test strip 1 each by Other route daily. Test glucose daily 100 each 12   insulin degludec (TRESIBA FLEXTOUCH) 100 UNIT/ML FlexTouch Pen Inject 15 Units into the skin daily after supper. 3 mL 5   Insulin Pen Needle (PEN NEEDLES 5/16") 30G X 8 MM MISC Pen needles used for lantus injections 100 each 3   Insulin Pen Needle (PEN NEEDLES) 32G X 4 MM MISC 1 each by Other route daily. 100 each 1   Lancets (ONETOUCH DELICA PLUS [id number]) MISC USE TO TEST BLOOD SUGAR ONCE A DAY 100 each 1   Lancets (ONETOUCH ULTRASOFT) lancets Use as instructed 100 each 3   losartan (COZAAR) 50 MG tablet TAKE 1 TABLET BY MOUTH EVERY DAY 90 tablet 1   metFORMIN (GLUCOPHAGE-XR) 750 MG 24 hr tablet TAKE 1 TABLET BY MOUTH EVERY DAY WITH BREAKFAST 90 tablet 1   metoprolol tartrate (LOPRESSOR) 25 MG tablet Take 37.5 mg by mouth 2 (two) times daily.     metoprolol tartrate (LOPRESSOR) 25 MG tablet Take 37.5 mg by mouth 2 (two) times daily.     Misc. Devices MISC SIG: 1 L of Oxygen via Nasal cannula around the clock Dispense Home Oxygen and Portable tank and necessary supplies. Dx: Hypoxemia, Shortness of breath (Patient taking differently: SIG: 3.5 L of Oxygen via Nasal cannula around the clock Dispense Home Oxygen and Portable tank and necessary supplies. Dx: Hypoxemia, Shortness of breath) 1 Device 0   Multiple Vitamins-Minerals (NO IRON MULT VITAMIN-MINERALS) TABS TAKE 1 TABLET BY MOUTH DAILY 30 tablet 0   omeprazole (PRILOSEC) 40 MG capsule TAKE 1 CAPSULE BY MOUTH EVERY DAY 90 capsule 1   OneTouch Delica Lancets 24O MISC USE TO TEST BLOOD SUGAR ONCE A DAY 100 each 1   ONETOUCH VERIO test strip USE 1 STRIP TO CHECK GLUCOSE ONCE DAILY 100 strip 1   potassium chloride (KLOR-CON) 10 MEQ tablet Take 2 tablets (20 mEq total) by mouth daily. 90 tablet 0   rosuvastatin (CRESTOR) 20 MG tablet Take 1 tablet (20 mg total) by  mouth daily. 90 tablet 3   TRELEGY ELLIPTA 100-62.5-25 MCG/ACT AEPB TAKE 1 PUFF BY MOUTH EVERY DAY 60 each 2   vitamin B-12 (CYANOCOBALAMIN) 500 MCG tablet Take 500 mcg by mouth daily.     No current facility-administered medications on file prior to visit.     The following portions of the patient's history were reviewed and updated as appropriate: allergies, current medications, past family history, past medical history, past social history, past surgical history and problem list.  ROS Otherwise  as in subjective above  Objective: BP 130/80   Pulse 72   Wt 150 lb (68 kg)   SpO2 94%   BMI 26.57 kg/m   Wt Readings from Last 3 Encounters:  09/06/21 150 lb (68 kg)  08/16/21 153 lb 3.2 oz (69.5 kg)  08/10/21 150 lb (68 kg)   BP Readings from Last 3 Encounters:  09/06/21 130/80  08/16/21 122/80  08/10/21 122/68    General appearance: alert, no distress, well developed, well nourished Seated with nasal cannula and on oxygen without distress No obvious dyspnea, no wheezing No edema of extremities today    Assessment: Encounter Diagnoses  Name Primary?   Syncope, unspecified syncope type Yes   Chronic respiratory failure with hypoxia (HCC)    Chronic diastolic congestive heart failure (HCC)    Coronary artery disease involving native coronary artery of native heart without angina pectoris    S/P CABG x 3    Pulmonary artery hypertension (HCC)    High risk medication use    Primary hypertension      Plan: We spent the bulk of today's visit discussing her overall health issues, diagnoses, medications, recommendations for managing her heart disease and lung disease.  I reviewed over her recent heart failure clinic notes and pulmonology notes and recommendations.  We discussed her heart medications.  We discussed her follow-up appointment with pulmonology this week as she has questions about exercise, questions about diet salt and water intake.  I gave her some general  recommendations about daily weights, limiting salt, limiting fluid intake and keeping that consistent.  We discussed checking labels for sodium intake and discussed the risk of excess salt intake.  She has questions about her oxygen tank options which she will defer to pulmonology this week.  We discussed the possibility of pulmonary rehab.  We discussed advanced directives, living will and healthcare power of attorney and recommended she complete these if not done already  Regarding her recent syncope event I suspect she had an acute episode of hypoxia that led to her brief syncope as she was not wearing her nasal cannula properly  I reviewed EKG strip from EMS which shows no change from her recent EKG through cardiology in June 2023.  She also had questions today about life expectancy.  We discussed that only God knows the day and time, but advised that with her current health conditions, her pulmonologist or cardiologist may be able to give a better answer to this question given the severity of her health issues.  We did discuss that she has significant health issues and will be on oxygen lifelong.  Handout printed for patient .   Kenlee was seen today for acute visit.  Diagnoses and all orders for this visit:  Syncope, unspecified syncope type  Chronic respiratory failure with hypoxia (HCC)  Chronic diastolic congestive heart failure (HCC)  Coronary artery disease involving native coronary artery of native heart without angina pectoris  S/P CABG x 3  Pulmonary artery hypertension (HCC)  High risk medication use  Primary hypertension  Spent > 45 minutes face to face with patient in discussion of symptoms, evaluation, plan and recommendations.    Follow up: With pulmonology this week as planned

## 2021-09-06 NOTE — Patient Instructions (Addendum)
Please talk to your lung doctor this week about whether you could enroll in pulmonary or cardiac rehab for supervised exercise.  If this is not an option, see if pulmonology wants to have home physical therapy come back out.   Weight yourself daily.  If more than a 3-5 lb weight gain in a given day, call heart doctor or our office for instructions on your fluid pills  Limit salt.  When looking at labels, look at the sodium amounts.  We want to keep your total daily sodium probably under 1500-2014m daily.  Keep in mind there are low sodium options for sardines that you mentioned and possibly gingerale, but typically gingerale has a lot of sodium  Measure how much liquids you are drinking daily and wrist this down so your doctor will know   Ask lung doctor the question you have about your oxgen tank and portable tank.   Advanced Directives: I recommend you consider completing a [place].   These documents respect your wishes and help alleviate burdens on your loved ones if you were to become terminally ill or be in a position to need those documents enforced.    You can complete Advanced Directives yourself, have them notarized, then have copies made for our office, for you and for anybody you feel should have them in safe keeping.  Or, you can have an attorney prepare these documents.   If you haven't updated your Last Will and Testament in a while, it may be worthwhile having an attorney prepare these documents together and save on some costs.

## 2021-09-07 ENCOUNTER — Encounter: Payer: Self-pay | Admitting: Medical

## 2021-09-08 ENCOUNTER — Ambulatory Visit: Payer: No Typology Code available for payment source | Admitting: Pulmonary Disease

## 2021-09-11 ENCOUNTER — Other Ambulatory Visit: Payer: Self-pay | Admitting: Medical

## 2021-09-16 DIAGNOSIS — J439 Emphysema, unspecified: Secondary | ICD-10-CM | POA: Diagnosis not present

## 2021-09-21 ENCOUNTER — Ambulatory Visit (INDEPENDENT_AMBULATORY_CARE_PROVIDER_SITE_OTHER): Payer: No Typology Code available for payment source | Admitting: Pulmonary Disease

## 2021-09-21 ENCOUNTER — Encounter: Payer: Self-pay | Admitting: Pulmonary Disease

## 2021-09-21 VITALS — BP 134/76 | HR 80 | Wt 149.2 lb

## 2021-09-21 DIAGNOSIS — J9611 Chronic respiratory failure with hypoxia: Secondary | ICD-10-CM | POA: Diagnosis not present

## 2021-09-21 DIAGNOSIS — J439 Emphysema, unspecified: Secondary | ICD-10-CM | POA: Diagnosis not present

## 2021-09-21 DIAGNOSIS — I270 Primary pulmonary hypertension: Secondary | ICD-10-CM

## 2021-09-21 NOTE — Patient Instructions (Signed)
Today, we discussed starting a medication called tadalafil for the pulmonary hypertension.  This is designed to reduce stress or pressure on the heart by reducing pressure in the pulmonary arteries or blood vessels that go to the lungs.  After discussing risk and benefits and potential side effects, you decided to not start this medication today.  We will reevaluate in the coming months.  No medication changes today  Return to clinic in 3 months or sooner as needed with Dr. Silas Flood

## 2021-09-22 NOTE — Progress Notes (Signed)
_0  ID: Jessica Hart, female    DOB: 1940-10-13, 81 y.o.   MRN: [id number]  Chief Complaint  Patient presents with   Follow-up    Pt is here for follow up for pulm artery htn. Pt states that she is doing well. She states that she does have some swelling noted in left foot. Pt was able to walk in office today. Pt is on 4L of oxygen.     Referring provider: Carlena Hurl, PA-C  HPI:   81 y.o. woman whom we are seeing in hospital follow up for volume overload and diagnosis of pulmonary HTN on RHC.  Reports stable weight.  149 pounds today.  Was about 152 at last visit.   Discussed at length why the need for oxygen including emphysema, may be some mild scarring, pulmonary hypertension VQ mismatch.  Stressed the importance of keeping oxygen above 88%.  Husband says that use of drop in the 70s but is not doing that now she is wearing oxygen more as instructed.  Will drop below 88% on a daily basis though.  We discussed at length increasing oxygen as needed to obtain 88%.  She asked about a POC device.  I reminded her we have tried the POC device in the past and she not maintain adequate oxygen saturation with pulse dose oxygen.  We discussed at length pulmonary hypertension.  Discussed at length what she likely has pulmonary hypertension.  We discussed using medicines to treat this.  She remains very leery of starting these medications.  Not very interested.  I stressed the importance of maintaining adequate weight, using diuretics, using oxygen to help minimize worsening pulmonary hypertension.  HPI at initial visit: Was in usual state of health.  Was seen PCP and routine follow-up.  Noted to be hypoxemic work-up, rooming.  Noted little bit of cough for 3 days.  She was placed on 1 L of oxygen.  Came up to 92%.  She was placed on Trelegy.  She was referred to pulmonary.  Home oxygen was delivered.  She represented to PCP about 10 days later.  Noted to be at 87% on 1 L with exertion  although no changes were made to oxygen prescription.  She denies significant dyspnea, nothing too significant.  Able to do tasks he needs to do.  Reviewed multiple CT scans that shows mild emphysematous changes primarily in the upper lobes with interlobular septal thickening and bronchiectasis in bilateral lower lobes without clear honeycombing on my interpretation.  PMH: Hypertension, diabetes, hyperlipidemia, tobacco abuse in remission surgical history: Colon surgery, hysterectomy Family history: CAD in first relatives, no significant [name] is a 81 year old with Social history: Former smoker, 45-pack-year, quit 2008, retired, lives with husband Restaurant manager, fast food / Pulmonary Flowsheets:   ACT:      No data to display           MMRC:     No data to display           Epworth:      No data to display           Tests:   FENO:  No results found for: "NITRICOXIDE"  PFT:    Latest Ref Rng & Units 04/06/2021    3:35 PM  PFT Results  FVC-Pre L 1.74   FVC-Predicted Pre % 89   FVC-Post L 1.64   FVC-Predicted Post % 84   Pre FEV1/FVC % % 83   Post FEV1/FCV % % 84  FEV1-Pre L 1.44   FEV1-Predicted Pre % 96   FEV1-Post L 1.37   DLCO uncorrected ml/min/mmHg 6.61   DLCO UNC% % 36   DLVA Predicted % 55   TLC L 3.72   TLC % Predicted % 75   RV % Predicted % 60   Personally reviewed interpret spirometry suggestive of moderate to mild restriction versus gas trapping, no bronchodilator response, no fixed obstruction, TLC moderately reduced, DLCO severely reduced  WALK:      No data to display           Imaging: Personally reviewed and as per EMR discussion of this note No results found.  Lab Results: Personally reviewed, no anemia, no polycythemia CBC    Component Value Date/Time   WBC 8.5 07/13/2021 0300   RBC 4.80 07/13/2021 0300   HGB 14.3 07/13/2021 1649   HGB 14.5 02/08/2021 1205   HCT 42.0 07/13/2021 1649   HCT 45.6 02/08/2021 1205    PLT 239 07/13/2021 0300   PLT 286 02/08/2021 1205   MCV 85.4 07/13/2021 0300   MCV 85 02/08/2021 1205   MCH 27.9 07/13/2021 0300   MCHC 32.7 07/13/2021 0300   RDW 16.6 (H) 07/13/2021 0300   RDW 14.6 02/08/2021 1205   LYMPHSABS 1.3 07/13/2021 0300   LYMPHSABS 1.9 02/08/2021 1205   MONOABS 0.7 07/13/2021 0300   EOSABS 0.2 07/13/2021 0300   EOSABS 0.1 02/08/2021 1205   BASOSABS 0.1 07/13/2021 0300   BASOSABS 0.1 02/08/2021 1205    BMET    Component Value Date/Time   NA 140 07/27/2021 1134   K 4.3 07/27/2021 1134   CL 100 07/27/2021 1134   CO2 21 07/27/2021 1134   GLUCOSE 193 (H) 07/27/2021 1134   GLUCOSE 100 (H) 07/17/2021 0300   BUN 18 07/27/2021 1134   CREATININE 0.99 07/27/2021 1134   CREATININE 0.69 01/10/2017 1235   CALCIUM 10.0 07/27/2021 1134   GFRNONAA >60 07/17/2021 0300   GFRNONAA 86 01/10/2017 1235   GFRAA 97 02/02/2020 1108   GFRAA 99 01/10/2017 1235    BNP    Component Value Date/Time   BNP 727.3 (H) 07/12/2021 1546    ProBNP    Component Value Date/Time   PROBNP 1,314.0 (H) 08/10/2021 1600    Specialty Problems       Pulmonary Problems   SOB (shortness of breath)   Pulmonary emphysema (HCC)   Pulmonary nodules   Chronic respiratory failure with hypoxia (HCC)    Allergies  Allergen Reactions   Lisinopril Itching and Cough    Immunization History  Administered Date(s) Administered   Fluad Quad(high Dose 65+) 11/11/2018   Influenza Whole 12/11/2007   Influenza, High Dose Seasonal PF 02/29/2016, 01/10/2017, 11/15/2017   Influenza,inj,Quad PF,6+ Mos 10/29/2012, 04/06/2014, 02/19/2015   PFIZER(Purple Top)SARS-COV-2 Vaccination 04/22/2019, 05/13/2019, 01/30/2020   Pneumococcal Conjugate-13 04/06/2014   Pneumococcal Polysaccharide-23 12/11/2007   Td 05/28/2004    Past Medical History:  Diagnosis Date   Arthritis    Cataract bilaterl 3 years ago   Coronary artery disease    Diabetes mellitus 02/27/2005   Diabetes mellitus type 2,  controlled, without complications ([facility]) 08/13/735   Qualifier: Diagnosis of  By: Jessica Hart     Diverticulosis    Dyspnea    Echocardiogram findings abnormal, without diagnosis 2005   EF 47%, no ischemia, no infarction   Former smoker 01/10/2017   45 pack year history, quit in 2007    GERD (gastroesophageal reflux disease)    Heart  murmur    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   History of bone density study 2006   Lt hip = -1.0, L spine = -1.8    Hyperlipidemia    Hypertension     Tobacco History: Social History   Tobacco Use  Smoking Status Former   Packs/day: 1.00   Years: 45.00   Total pack years: 45.00   Types: Cigarettes   Quit date: 07/27/2005   Years since quitting: 16.1  Smokeless Tobacco Former   Quit date: 03/2005   Counseling given: Not Answered   Continue to not smoke  Outpatient Encounter Medications as of 09/21/2021  Medication Sig   acetaminophen (TYLENOL) 500 MG tablet Take 2 tablets (1,000 mg total) by mouth every 8 (eight) hours as needed.   aspirin EC 81 MG tablet Take 1 tablet (81 mg total) by mouth daily. Swallow whole.   Cholecalciferol (D3 5000) 125 MCG (5000 UT) capsule Take 5,000 Units by mouth daily.   clotrimazole-betamethasone (LOTRISONE) cream Apply 1 application topically daily.   CREON 36000-114000 units CPEP capsule TAKE 2 CAPSULES BY MOUTH 3 TIMES A DAY WITH MEALS AND TAKE 1 CAPSULE WITH EACH SNACK TWICE DAILY   FARXIGA 5 MG TABS tablet TAKE 1 TABLET (5 MG TOTAL) BY MOUTH DAILY.   furosemide (LASIX) 20 MG tablet TAKE 3 TABLETS BY MOUTH EVERY DAY   glucose blood test strip Use as instructed   glucose blood test strip 1 each by Other route daily. Test glucose daily   insulin degludec (TRESIBA FLEXTOUCH) 100 UNIT/ML FlexTouch Pen Inject 15 Units into the skin daily after supper.   Insulin Pen Needle (PEN NEEDLES 5/16") 30G X 8 MM MISC Pen needles used for lantus injections   Insulin Pen Needle (PEN NEEDLES) 32G X 4 MM MISC 1 each by Other  route daily.   Lancets (ONETOUCH DELICA PLUS [id number]) MISC USE TO TEST BLOOD SUGAR ONCE A DAY   Lancets (ONETOUCH ULTRASOFT) lancets Use as instructed   losartan (COZAAR) 50 MG tablet TAKE 1 TABLET BY MOUTH EVERY DAY   metFORMIN (GLUCOPHAGE-XR) 750 MG 24 hr tablet TAKE 1 TABLET BY MOUTH EVERY DAY WITH BREAKFAST   metoprolol tartrate (LOPRESSOR) 25 MG tablet Take 37.5 mg by mouth 2 (two) times daily.   metoprolol tartrate (LOPRESSOR) 25 MG tablet Take 37.5 mg by mouth 2 (two) times daily.   Misc. Devices MISC SIG: 1 L of Oxygen via Nasal cannula around the clock Dispense Home Oxygen and Portable tank and necessary supplies. Dx: Hypoxemia, Shortness of breath (Patient taking differently: SIG: 3.5 L of Oxygen via Nasal cannula around the clock Dispense Home Oxygen and Portable tank and necessary supplies. Dx: Hypoxemia, Shortness of breath)   Multiple Vitamins-Minerals (NO IRON MULT VITAMIN-MINERALS) TABS TAKE 1 TABLET BY MOUTH DAILY   omeprazole (PRILOSEC) 40 MG capsule TAKE 1 CAPSULE BY MOUTH EVERY DAY   OneTouch Delica Lancets 81X MISC USE TO TEST BLOOD SUGAR ONCE A DAY   ONETOUCH VERIO test strip USE 1 STRIP TO CHECK GLUCOSE ONCE DAILY   potassium chloride (KLOR-CON) 10 MEQ tablet Take 2 tablets (20 mEq total) by mouth daily.   rosuvastatin (CRESTOR) 20 MG tablet Take 1 tablet (20 mg total) by mouth daily.   TRELEGY ELLIPTA 100-62.5-25 MCG/ACT AEPB TAKE 1 PUFF BY MOUTH EVERY DAY   vitamin B-12 (CYANOCOBALAMIN) 500 MCG tablet Take 500 mcg by mouth daily.   No facility-administered encounter medications on file as of 09/21/2021.     Review of Systems  Review  of Systems  N/a Physical Exam  BP 134/76 (BP Location: Left Arm, Patient Position: Sitting, Cuff Size: Normal)   Pulse 80   Wt 149 lb 3.2 oz (67.7 kg)   SpO2 91%   BMI 26.43 kg/m   Wt Readings from Last 5 Encounters:  09/21/21 149 lb 3.2 oz (67.7 kg)  09/06/21 150 lb (68 kg)  08/16/21 153 lb 3.2 oz (69.5 kg)  08/10/21 150 lb  (68 kg)  07/27/21 154 lb 9.6 oz (70.1 kg)    BMI Readings from Last 5 Encounters:  09/21/21 26.43 kg/m  09/06/21 26.57 kg/m  08/16/21 27.14 kg/m  08/10/21 26.57 kg/m  07/27/21 27.39 kg/m     Physical Exam General: Well-appearing, no acute distress Eyes: EOMI, no icterus Pulmonary: Clear, normal breathing, no crackles Cardiovascular: Regular rate and rhythm, no murmurs MSK: No synovitis, no joint effusion Neuro: Normal gait, no weakness Psych: Normal mood, full affect   Assessment & Plan:   Chronic hypoxemic respiratory failure: Suspect related to emphysema seen on CT scan.  Possible basilar fibrosis could not appear classic for UIP but no other clear pattern.  Possibly due to recurrent aspiration in setting of long history of GERD.  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  Also suspect contribution of VQ mismatch due to recently discovered likely pulmonary hypertension on TTE.  It was reiterated again how she should be using oxygen.  She expressed understanding.  Although is not totally clear she is using appropriately at all times.  Stressed keeping oxygen saturation above 90%, ideally greater than 92%.  Continue nocturnal oxygen, 4 L.  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  She has poor insight into this. Consider antifibrotic's but she has declined other medications to treat pulmonary issues.  Can reevaluate in the future.    Pulmonary hypertension: Based on TTE with significant findings of RV enlargement, mildly reduced RV function, mildly dilated RA all new compared to 2021.  Confirmed on right heart cath 06/2021 with normal LVEDP, preserved CO and CI.  Suspect largely driven by group 3 disease and hypoxemia and nocturnal hypoxia via as historically she has not used oxygen as prescribed.  No OSA on PSG.  Possible group 2 disease given diastolic dysfunction although not sure how big contributor this is.  No history of VTE/PE to suggest group 4 disease.     Continue Lasix.  Long and frank discussion with patient.  She is not a very good candidate for pulmonary vasodilators given the multifactorial nature of disease, significant emphysema on CT scan, demonstration of some lack of understanding of instructions in the past.  Given her significant emphysema, inhaled vasodilators have not demonstrated effectiveness in the past trials.  Systemic vasodilators pose significant risk of VQ mismatch.  We did discussed using pulmonary vasodilators both inhaled and oral.  Discussed at length the side effect profile and what to expect.  Discussed risk and benefits in terms of VQ mismatch in setting of parenchymal disease as well possible bilateral pulmonary edema depending on how well left-sided heart handles increased preload.  I think she would really struggle with 4 times daily medication such as Tyvaso.  Recommend starting daily tadalafil.  After long discussion she does not wish to do this.  Husband supports this decision.  Plan to repeat TTE at 15-monthinterval after she is consistently using oxygen.   Return in about 3 months (around 12/22/2021).   MLanier Clam MD 09/22/2021  I spent 41 minutes in the  care of the patient including face-to-face visit, review of records, coordination of care.

## 2021-09-28 ENCOUNTER — Emergency Department (HOSPITAL_COMMUNITY): Payer: No Typology Code available for payment source

## 2021-09-28 ENCOUNTER — Other Ambulatory Visit: Payer: Self-pay

## 2021-09-28 ENCOUNTER — Encounter: Payer: Self-pay | Admitting: Gastroenterology

## 2021-09-28 ENCOUNTER — Encounter (HOSPITAL_COMMUNITY): Payer: Self-pay | Admitting: Emergency Medicine

## 2021-09-28 ENCOUNTER — Ambulatory Visit (INDEPENDENT_AMBULATORY_CARE_PROVIDER_SITE_OTHER): Payer: No Typology Code available for payment source | Admitting: Gastroenterology

## 2021-09-28 ENCOUNTER — Observation Stay (HOSPITAL_COMMUNITY)
Admission: EM | Admit: 2021-09-28 | Discharge: 2021-09-30 | Disposition: A | Discharge status: Home Health | Payer: No Typology Code available for payment source | Attending: Internal Medicine | Admitting: Internal Medicine

## 2021-09-28 VITALS — BP 96/68 | HR 90 | Ht 63.0 in | Wt 151.0 lb

## 2021-09-28 DIAGNOSIS — I2510 Atherosclerotic heart disease of native coronary artery without angina pectoris: Secondary | ICD-10-CM | POA: Insufficient documentation

## 2021-09-28 DIAGNOSIS — Z7984 Long term (current) use of oral hypoglycemic drugs: Secondary | ICD-10-CM | POA: Diagnosis not present

## 2021-09-28 DIAGNOSIS — K8689 Other specified diseases of pancreas: Secondary | ICD-10-CM

## 2021-09-28 DIAGNOSIS — R0902 Hypoxemia: Secondary | ICD-10-CM | POA: Diagnosis not present

## 2021-09-28 DIAGNOSIS — I509 Heart failure, unspecified: Secondary | ICD-10-CM

## 2021-09-28 DIAGNOSIS — N179 Acute kidney failure, unspecified: Secondary | ICD-10-CM | POA: Insufficient documentation

## 2021-09-28 DIAGNOSIS — Z87891 Personal history of nicotine dependence: Secondary | ICD-10-CM | POA: Diagnosis not present

## 2021-09-28 DIAGNOSIS — J9611 Chronic respiratory failure with hypoxia: Secondary | ICD-10-CM | POA: Insufficient documentation

## 2021-09-28 DIAGNOSIS — R634 Abnormal weight loss: Secondary | ICD-10-CM | POA: Diagnosis not present

## 2021-09-28 DIAGNOSIS — Z7982 Long term (current) use of aspirin: Secondary | ICD-10-CM | POA: Diagnosis not present

## 2021-09-28 DIAGNOSIS — R55 Syncope and collapse: Secondary | ICD-10-CM | POA: Diagnosis not present

## 2021-09-28 DIAGNOSIS — Z951 Presence of aortocoronary bypass graft: Secondary | ICD-10-CM | POA: Diagnosis not present

## 2021-09-28 DIAGNOSIS — E1169 Type 2 diabetes mellitus with other specified complication: Secondary | ICD-10-CM | POA: Diagnosis not present

## 2021-09-28 DIAGNOSIS — J449 Chronic obstructive pulmonary disease, unspecified: Secondary | ICD-10-CM | POA: Diagnosis not present

## 2021-09-28 DIAGNOSIS — R0602 Shortness of breath: Principal | ICD-10-CM

## 2021-09-28 DIAGNOSIS — E119 Type 2 diabetes mellitus without complications: Secondary | ICD-10-CM | POA: Insufficient documentation

## 2021-09-28 DIAGNOSIS — Z794 Long term (current) use of insulin: Secondary | ICD-10-CM | POA: Insufficient documentation

## 2021-09-28 DIAGNOSIS — Z79899 Other long term (current) drug therapy: Secondary | ICD-10-CM | POA: Diagnosis not present

## 2021-09-28 DIAGNOSIS — I5033 Acute on chronic diastolic (congestive) heart failure: Secondary | ICD-10-CM | POA: Diagnosis not present

## 2021-09-28 DIAGNOSIS — E785 Hyperlipidemia, unspecified: Secondary | ICD-10-CM | POA: Insufficient documentation

## 2021-09-28 DIAGNOSIS — R0600 Dyspnea, unspecified: Secondary | ICD-10-CM | POA: Diagnosis not present

## 2021-09-28 DIAGNOSIS — K529 Noninfective gastroenteritis and colitis, unspecified: Secondary | ICD-10-CM

## 2021-09-28 DIAGNOSIS — I110 Hypertensive heart disease with heart failure: Secondary | ICD-10-CM | POA: Insufficient documentation

## 2021-09-28 DIAGNOSIS — E1159 Type 2 diabetes mellitus with other circulatory complications: Secondary | ICD-10-CM | POA: Diagnosis present

## 2021-09-28 DIAGNOSIS — I152 Hypertension secondary to endocrine disorders: Secondary | ICD-10-CM | POA: Diagnosis present

## 2021-09-28 LAB — CBC WITH DIFFERENTIAL/PLATELET
Abs Immature Granulocytes: 0.03 10*3/uL (ref 0.00–0.07)
Basophils Absolute: 0.1 10*3/uL (ref 0.0–0.1)
Basophils Relative: 1 %
Eosinophils Absolute: 0.1 10*3/uL (ref 0.0–0.5)
Eosinophils Relative: 1 %
HCT: 47.3 % — ABNORMAL HIGH (ref 36.0–46.0)
Hemoglobin: 15.1 g/dL — ABNORMAL HIGH (ref 12.0–15.0)
Immature Granulocytes: 0 %
Lymphocytes Relative: 19 %
Lymphs Abs: 1.4 10*3/uL (ref 0.7–4.0)
MCH: 28 pg (ref 26.0–34.0)
MCHC: 31.9 g/dL (ref 30.0–36.0)
MCV: 87.6 fL (ref 80.0–100.0)
Monocytes Absolute: 0.4 10*3/uL (ref 0.1–1.0)
Monocytes Relative: 5 %
Neutro Abs: 5.5 10*3/uL (ref 1.7–7.7)
Neutrophils Relative %: 74 %
Platelets: 197 10*3/uL (ref 150–400)
RBC: 5.4 MIL/uL — ABNORMAL HIGH (ref 3.87–5.11)
RDW: 18 % — ABNORMAL HIGH (ref 11.5–15.5)
WBC: 7.6 10*3/uL (ref 4.0–10.5)
nRBC: 0 % (ref 0.0–0.2)

## 2021-09-28 LAB — BASIC METABOLIC PANEL
Anion gap: 14 (ref 5–15)
BUN: 22 mg/dL (ref 8–23)
CO2: 20 mmol/L — ABNORMAL LOW (ref 22–32)
Calcium: 9.2 mg/dL (ref 8.9–10.3)
Chloride: 107 mmol/L (ref 98–111)
Creatinine, Ser: 1.42 mg/dL — ABNORMAL HIGH (ref 0.44–1.00)
GFR, Estimated: 38 mL/min — ABNORMAL LOW (ref >60)
Glucose, Bld: 158 mg/dL — ABNORMAL HIGH (ref 70–99)
Potassium: 3.6 mmol/L (ref 3.5–5.1)
Sodium: 141 mmol/L (ref 135–145)

## 2021-09-28 LAB — BRAIN NATRIURETIC PEPTIDE: B Natriuretic Peptide: 1007.8 pg/mL — ABNORMAL HIGH (ref 0.0–100.0)

## 2021-09-28 LAB — TROPONIN I (HIGH SENSITIVITY): Troponin I (High Sensitivity): 23 ng/L — ABNORMAL HIGH (ref <18)

## 2021-09-28 NOTE — H&P (Incomplete)
History and Physical    Jessica Hart QHU:[id number] DOB: [birth year] DOA: 09/28/2021  PCP: Carlena Hurl, PA-C  Patient coming from: Home via EMS  I have personally briefly reviewed patient's old medical records in [place]  Chief Complaint: Shortness of breath  HPI: Jessica Hart is a 81 y.o. female with medical history significant for chronic HFpEF with RV failure (EF 65-70%), pulmonary artery hypertension, chronic hypoxic respiratory failure on 4.5-5 L O2 via [place], ILD, CAD s/p CABG, pancreatic insufficiency, insulin-dependent T2DM, HTN, HLD who presented to the ED for evaluation of shortness of breath.  Patient reports new onset of dyspnea around 8 PM on 09/28/2021.  She has had associated nonproductive cough.  This occurred after she had a loose bowel movement followed by diaphoresis.  She says she felt "out of sorts" but did not lose consciousness or fall.  She had some nonradiating midsternal chest tightness.  She denies any nausea, vomiting, abdominal pain, dysuria.  She has seen some increased swelling in her lower legs.  ED Course  Labs/Imaging on admission: I have personally reviewed following labs and imaging studies.  Initial vitals showed BP 116/61, pulse 83, RR 19, temp 97.5 F, SPO2 95% on 4 L O2 via .  Labs show sodium 141, potassium 3.6, bicarb 20, BUN 22, creatinine 1.42, serum glucose 158, WBC 7.6, hemoglobin 15.1, platelets 197,000, BNP 1007.8, troponin 23.  2 view chest x-ray negative for focal consolidation, edema, effusion.  The hospitalist service was consulted to admit for further evaluation and management.  Review of Systems: All systems reviewed and are negative except as documented in history of present illness above.   Past Medical History:  Diagnosis Date   Arthritis    Cataract bilaterl 3 years ago   Coronary artery disease    Diabetes mellitus 02/27/2005   Diabetes mellitus type 2, controlled, without complications ([place])  4/65/6812   Qualifier: Diagnosis of  By: Drucie Ip     Diverticulosis    Dyspnea    Echocardiogram findings abnormal, without diagnosis 2005   EF 47%, no ischemia, no infarction   Former smoker 01/10/2017   45 pack year history, quit in 2007    GERD (gastroesophageal reflux disease)    Heart murmur    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   History of bone density study 2006   Lt hip = -1.0, L spine = -1.8    Hyperlipidemia    Hypertension     Past Surgical History:  Procedure Laterality Date   ABDOMINAL HYSTERECTOMY  1989   one ovary remains per patient   CARDIAC CATHETERIZATION  08/20/2019   COLONOSCOPY     CORONARY ARTERY BYPASS GRAFT N/A 09/02/2019   Procedure: CORONARY ARTERY BYPASS GRAFTING (CABG) TIMES THREE USING LEFT INTERNAL MAMMARY ARTERY AND RIGHT GREATER SAPHENOUS VEIN;  Surgeon: Lajuana Matte, MD;  Location: [facility];  Service: Open Heart Surgery;  Laterality: N/A;   ENDOVEIN HARVEST OF GREATER SAPHENOUS VEIN Right 09/02/2019   Procedure: ENDOVEIN HARVEST OF GREATER SAPHENOUS VEIN;  Surgeon: Lajuana Matte, MD;  Location: [facility];  Service: Open Heart Surgery;  Laterality: Right;   EYE SURGERY Bilateral    CATARACT   LEFT HEART CATH AND CORONARY ANGIOGRAPHY N/A 08/20/2019   Procedure: LEFT HEART CATH AND CORONARY ANGIOGRAPHY;  Surgeon: Belva Crome, MD;  Location: [facility];  Service: Cardiovascular;  Laterality: N/A;   RIGHT AND LEFT HEART CATH N/A 07/13/2021   Procedure: RIGHT AND LEFT HEART CATH;  Surgeon: Early Osmond, MD;  Location: [facility];  Service: Cardiovascular;  Laterality: N/A;   TEE WITHOUT CARDIOVERSION N/A 09/02/2019   Procedure: TRANSESOPHAGEAL ECHOCARDIOGRAM (TEE);  Surgeon: Lajuana Matte, MD;  Location: [facility];  Service: Open Heart Surgery;  Laterality: N/A;   TONSILECTOMY, ADENOIDECTOMY, BILATERAL MYRINGOTOMY AND TUBES  1965   TONSILLECTOMY      Social History:  reports that she quit smoking about 16 years ago. Her  smoking use included cigarettes. She has a 45.00 pack-year smoking history. She quit smokeless tobacco use about 16 years ago. She reports that she does not currently use alcohol. She reports that she does not use drugs.  Allergies  Allergen Reactions   Lisinopril Itching and Cough    Family History  Problem Relation Age of Onset   Heart disease Father    Cancer Sister    Tongue cancer Sister    Colon polyps Neg Hx    Rectal cancer Neg Hx    Stomach cancer Neg Hx      Prior to Admission medications   Medication Sig Start Date End Date Taking? Authorizing Provider  acetaminophen (TYLENOL) 500 MG tablet Take 2 tablets (1,000 mg total) by mouth every 8 (eight) hours as needed. 09/07/19   Nani Skillern, PA-C  aspirin EC 81 MG tablet Take 1 tablet (81 mg total) by mouth daily. Swallow whole. 02/09/21   Tysinger, Camelia Eng, PA-C  Cholecalciferol (D3 5000) 125 MCG (5000 UT) capsule Take 5,000 Units by mouth daily.    [provider]  clotrimazole-betamethasone (LOTRISONE) cream Apply 1 application topically daily. 02/08/21   Tysinger, Camelia Eng, PA-C  CREON [phone] units CPEP capsule TAKE 2 CAPSULES BY MOUTH 3 TIMES A DAY WITH MEALS AND TAKE 1 CAPSULE WITH EACH SNACK TWICE DAILY 06/06/21   Nandigam, Venia Minks, MD  FARXIGA 5 MG TABS tablet TAKE 1 TABLET (5 MG TOTAL) BY MOUTH DAILY. 08/29/21   Tysinger, Camelia Eng, PA-C  furosemide (LASIX) 20 MG tablet TAKE 3 TABLETS BY MOUTH EVERY DAY 09/12/21   Tysinger, Camelia Eng, PA-C  glucose blood test strip Use as instructed 08/17/21   Tysinger, Camelia Eng, PA-C  glucose blood test strip 1 each by Other route daily. Test glucose daily 08/19/21   Tysinger, Camelia Eng, PA-C  insulin degludec (TRESIBA FLEXTOUCH) 100 UNIT/ML FlexTouch Pen Inject 15 Units into the skin daily after supper. 07/12/21   Tysinger, Camelia Eng, PA-C  Insulin Pen Needle (PEN NEEDLES 5/16") 30G X 8 MM MISC Pen needles used for lantus injections 05/04/15   Henson, Vickie L, NP-C  Insulin Pen  Needle (PEN NEEDLES) 32G X 4 MM MISC 1 each by Other route daily. 02/09/21   Tysinger, Camelia Eng, PA-C  Lancets (ONETOUCH DELICA PLUS [id number]) MISC USE TO TEST BLOOD SUGAR ONCE A DAY 08/19/21   Tysinger, Camelia Eng, PA-C  Lancets [facility] ULTRASOFT) lancets Use as instructed 08/17/21   Tysinger, Camelia Eng, PA-C  losartan (COZAAR) 50 MG tablet TAKE 1 TABLET BY MOUTH EVERY DAY 07/27/21   Tysinger, Camelia Eng, PA-C  metFORMIN (GLUCOPHAGE-XR) 750 MG 24 hr tablet TAKE 1 TABLET BY MOUTH EVERY DAY WITH BREAKFAST 04/19/21   Tysinger, Camelia Eng, PA-C  metoprolol tartrate (LOPRESSOR) 25 MG tablet Take 37.5 mg by mouth 2 (two) times daily.    [provider]  metoprolol tartrate (LOPRESSOR) 25 MG tablet Take 37.5 mg by mouth 2 (two) times daily.    [provider]  Misc. Devices MISC SIG:  1 L of Oxygen via Nasal cannula around the clock Dispense Home Oxygen and Portable tank and necessary supplies. Dx: Hypoxemia, Shortness of breath Patient taking differently: SIG: 3.5 L of Oxygen via Nasal cannula around the clock Dispense Home Oxygen and Portable tank and necessary supplies. Dx: Hypoxemia, Shortness of breath 02/09/21   Tysinger, Camelia Eng, PA-C  Multiple Vitamins-Minerals (NO IRON MULT VITAMIN-MINERALS) TABS TAKE 1 TABLET BY MOUTH DAILY 08/19/21   Tysinger, Camelia Eng, PA-C  omeprazole (PRILOSEC) 40 MG capsule TAKE 1 CAPSULE BY MOUTH EVERY DAY 04/19/21   Tysinger, Camelia Eng, PA-C  OneTouch Delica Lancets 91Y MISC USE TO TEST BLOOD SUGAR ONCE A DAY 12/26/19   Henson, Vickie L, NP-C  ONETOUCH VERIO test strip USE 1 STRIP TO CHECK GLUCOSE ONCE DAILY 09/28/20   Henson, Vickie L, NP-C  potassium chloride (KLOR-CON) 10 MEQ tablet Take 2 tablets (20 mEq total) by mouth daily. 07/17/21   Arrien, Jimmy Picket, MD  rosuvastatin (CRESTOR) 20 MG tablet Take 1 tablet (20 mg total) by mouth daily. 02/09/21 08/17/22  Tysinger, Camelia Eng, PA-C  TRELEGY ELLIPTA 100-62.5-25 MCG/ACT AEPB TAKE 1 PUFF BY MOUTH EVERY DAY 08/26/21    Tysinger, Camelia Eng, PA-C  vitamin B-12 (CYANOCOBALAMIN) 500 MCG tablet Take 500 mcg by mouth daily.    [provider]    Physical Exam: Vitals:   09/28/21 2245 09/28/21 2300 09/28/21 2315 09/28/21 2330  BP: 102/60 109/64 122/72 119/67  Pulse: 75 74 73 74  Resp: _0 Temp:      TempSrc:      SpO2: 92% 93% 95% 92%  Weight:       Constitutional: Resting supine in bed, NAD, calm, comfortable Eyes: EOMI, lids and conjunctivae normal ENMT: Mucous membranes are moist. Posterior pharynx clear of any exudate or lesions.Normal dentition.  Neck: normal, supple, no masses. Respiratory: clear to auscultation bilaterally, no wheezing, no crackles. Normal respiratory effort while on 4 L O2 via [place]. No accessory muscle use.  Cardiovascular: Regular rate and rhythm, systolic murmur present.  Trace bilateral lower extremity edema. 2+ pedal pulses. Abdomen: no tenderness, no masses palpated Musculoskeletal: no clubbing / cyanosis. No joint deformity upper and lower extremities. Good ROM, no contractures. Normal muscle tone.  Skin: no rashes, lesions, ulcers. No induration Neurologic: Sensation intact. Strength 5/5 in all 4.  Psychiatric: Alert and oriented x 3. Normal mood.   EKG: Personally reviewed. Sinus rhythm, rate 80 bpm, RAE, no acute ischemic changes.  Assessment/Plan Principal Problem:   Acute on chronic heart failure with preserved ejection fraction (HFpEF) (HCC) Active Problems:   AKI (acute kidney injury) ([place])   Chronic respiratory failure with hypoxia (HCC)   S/P CABG x 3   Hypertension associated with diabetes ([place])   Insulin dependent type 2 diabetes mellitus ([place])   Hyperlipidemia associated with type 2 diabetes mellitus ([place])   Pancreatic insufficiency   Jessica Hart is a 81 y.o. female with medical history significant for chronic HFpEF with RV failure (EF 65-70%), pulmonary artery hypertension, chronic hypoxic respiratory failure on 4.5-5 L O2 via [place],  ILD, CAD s/p CABG, pancreatic insufficiency, insulin-dependent T2DM, HTN, HLD who is admitted with acute on chronic HFpEF.  Assessment and Plan: * Acute on chronic heart failure with preserved ejection fraction (HFpEF) (HCC) Chronic RV failure Takes Lasix 60 mg daily at home.  Last EF 65-70% by TTE 03/08/2021. -Give 1 dose IV Lasix 40 mg now, monitor response -Continue Lopressor 37.5 mg BID -Hold losartan  for now to avoid hypotension -Monitor strict I/O's and daily weights  AKI (acute kidney injury) ([place]) Creatinine 1.42 on admission compared to baseline 0.9-1.0.  Monitor response to IV diuresis.  Hold losartan.  Chronic respiratory failure with hypoxia (HCC) Stable on home 4.5-5 L O2 via [place].  S/P CABG x 3 Continue aspirin, statin, Lopressor.  Hypertension associated with diabetes ([place]) Continue Lopressor, holding losartan as above.  Insulin dependent type 2 diabetes mellitus ([place]) Continue reduced Semglee 10 units qhs, add SSI.  Pancreatic insufficiency Continue Creon with meals.  Hyperlipidemia associated with type 2 diabetes mellitus (HCC) Continue rosuvastatin.  DVT prophylaxis:  Code Status: Full code, confirmed with patient on admission Family Communication: Discussed with patient's husband at bedside Disposition Plan: From home and likely discharge to home pending clinical progress Consults called: None Severity of Illness: The appropriate patient status for this patient is OBSERVATION. Observation status is judged to be reasonable and necessary in order to provide the required intensity of service to ensure the patient's safety. The patient's presenting symptoms, physical exam findings, and initial radiographic and laboratory data in the context of their medical condition is felt to place them at decreased risk for further clinical deterioration. Furthermore, it is anticipated that the patient will be medically stable for discharge from the hospital within 2 midnights of  admission.   Zada Finders MD [facility]  If 7PM-7AM, please contact night-coverage [email or web address]  09/29/2021, 12:35 AM

## 2021-09-28 NOTE — Patient Instructions (Addendum)
You will be contacted by [facility] in the next 2 days to arrange a CT Abdomen.  The number on your caller ID will be [phone], please answer when they call.  If you have not heard from them in 2 days please call [phone] to schedule.     Continue Creon with meals  Eat small frequent meals  Follow up in 1 year  Due to recent changes in healthcare laws, you may see the results of your imaging and laboratory studies on MyChart before your provider has had a chance to review them.  We understand that in some cases there may be results that are confusing or concerning to you. Not all laboratory results come back in the same time frame and the provider may be waiting for multiple results in order to interpret others.  Please give [place] 48 hours in order for your provider to thoroughly review all the results before contacting the office for clarification of your results.    I appreciate the  opportunity to care for you  Thank You   Harl Bowie , MD

## 2021-09-28 NOTE — Progress Notes (Signed)
Jessica Hart    [id number]    1940/03/11  Primary Care Physician:Tysinger, Camelia Eng, PA-C  Referring Physician: Carlena Hurl, PA-C [address] [place],  [place] [address]   Chief complaint:  IBS  HPI:  81 year old very pleasant female here for follow-up visit for chronic IBS diarrhea and pancreatic insufficiency   She is taking Creon 72,000 units with meals and 36,000 units with snacks.  Most days she does not eat 3 meals. She has decreased appetite and weight loss. Denies any nausea, vomiting, abdominal pain, melena or bright red blood per rectum   Review of system positive for knee pain, she has not been able to exercise or walk much.     GI work up: October 2019 stool GI pathogen panel negative for Infectious etiology   Colonoscopy January 01, 2018 with biopsies negative for microscopic colitis   December 07, 2017 fecal elastase 124 suggestive of pancreatic insufficiency     Outpatient Encounter Medications as of 09/28/2021  Medication Sig   acetaminophen (TYLENOL) 500 MG tablet Take 2 tablets (1,000 mg total) by mouth every 8 (eight) hours as needed.   aspirin EC 81 MG tablet Take 1 tablet (81 mg total) by mouth daily. Swallow whole.   Cholecalciferol (D3 5000) 125 MCG (5000 UT) capsule Take 5,000 Units by mouth daily.   clotrimazole-betamethasone (LOTRISONE) cream Apply 1 application topically daily.   CREON 36000-114000 units CPEP capsule TAKE 2 CAPSULES BY MOUTH 3 TIMES A DAY WITH MEALS AND TAKE 1 CAPSULE WITH EACH SNACK TWICE DAILY   FARXIGA 5 MG TABS tablet TAKE 1 TABLET (5 MG TOTAL) BY MOUTH DAILY.   furosemide (LASIX) 20 MG tablet TAKE 3 TABLETS BY MOUTH EVERY DAY   glucose blood test strip Use as instructed   glucose blood test strip 1 each by Other route daily. Test glucose daily   insulin degludec (TRESIBA FLEXTOUCH) 100 UNIT/ML FlexTouch Pen Inject 15 Units into the skin daily after supper.   Insulin Pen Needle (PEN NEEDLES 5/16")  30G X 8 MM MISC Pen needles used for lantus injections   Insulin Pen Needle (PEN NEEDLES) 32G X 4 MM MISC 1 each by Other route daily.   Lancets (ONETOUCH DELICA PLUS [id number]) MISC USE TO TEST BLOOD SUGAR ONCE A DAY   Lancets (ONETOUCH ULTRASOFT) lancets Use as instructed   losartan (COZAAR) 50 MG tablet TAKE 1 TABLET BY MOUTH EVERY DAY   metFORMIN (GLUCOPHAGE-XR) 750 MG 24 hr tablet TAKE 1 TABLET BY MOUTH EVERY DAY WITH BREAKFAST   metoprolol tartrate (LOPRESSOR) 25 MG tablet Take 37.5 mg by mouth 2 (two) times daily.   metoprolol tartrate (LOPRESSOR) 25 MG tablet Take 37.5 mg by mouth 2 (two) times daily.   Misc. Devices MISC SIG: 1 L of Oxygen via Nasal cannula around the clock Dispense Home Oxygen and Portable tank and necessary supplies. Dx: Hypoxemia, Shortness of breath (Patient taking differently: SIG: 3.5 L of Oxygen via Nasal cannula around the clock Dispense Home Oxygen and Portable tank and necessary supplies. Dx: Hypoxemia, Shortness of breath)   Multiple Vitamins-Minerals (NO IRON MULT VITAMIN-MINERALS) TABS TAKE 1 TABLET BY MOUTH DAILY   omeprazole (PRILOSEC) 40 MG capsule TAKE 1 CAPSULE BY MOUTH EVERY DAY   OneTouch Delica Lancets 44Y MISC USE TO TEST BLOOD SUGAR ONCE A DAY   ONETOUCH VERIO test strip USE 1 STRIP TO CHECK GLUCOSE ONCE DAILY   potassium chloride (KLOR-CON) 10 MEQ tablet  Take 2 tablets (20 mEq total) by mouth daily.   rosuvastatin (CRESTOR) 20 MG tablet Take 1 tablet (20 mg total) by mouth daily.   TRELEGY ELLIPTA 100-62.5-25 MCG/ACT AEPB TAKE 1 PUFF BY MOUTH EVERY DAY   vitamin B-12 (CYANOCOBALAMIN) 500 MCG tablet Take 500 mcg by mouth daily.   No facility-administered encounter medications on file as of 09/28/2021.    Allergies as of 09/28/2021 - Review Complete 09/21/2021  Allergen Reaction Noted   Lisinopril Itching and Cough 05/26/2009    Past Medical History:  Diagnosis Date   Arthritis    Cataract bilaterl 3 years ago   Coronary artery disease     Diabetes mellitus 02/27/2005   Diabetes mellitus type 2, controlled, without complications ([place]) 6/38/9373   Qualifier: Diagnosis of  By: Drucie Ip     Diverticulosis    Dyspnea    Echocardiogram findings abnormal, without diagnosis 2005   EF 47%, no ischemia, no infarction   Former smoker 01/10/2017   45 pack year history, quit in 2007    GERD (gastroesophageal reflux disease)    Heart murmur    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   History of bone density study 2006   Lt hip = -1.0, L spine = -1.8    Hyperlipidemia    Hypertension     Past Surgical History:  Procedure Laterality Date   ABDOMINAL HYSTERECTOMY  1989   one ovary remains per patient   CARDIAC CATHETERIZATION  08/20/2019   COLONOSCOPY     CORONARY ARTERY BYPASS GRAFT N/A 09/02/2019   Procedure: CORONARY ARTERY BYPASS GRAFTING (CABG) TIMES THREE USING LEFT INTERNAL MAMMARY ARTERY AND RIGHT GREATER SAPHENOUS VEIN;  Surgeon: Lajuana Matte, MD;  Location: [facility];  Service: Open Heart Surgery;  Laterality: N/A;   ENDOVEIN HARVEST OF GREATER SAPHENOUS VEIN Right 09/02/2019   Procedure: ENDOVEIN HARVEST OF GREATER SAPHENOUS VEIN;  Surgeon: Lajuana Matte, MD;  Location: [facility];  Service: Open Heart Surgery;  Laterality: Right;   EYE SURGERY Bilateral    CATARACT   LEFT HEART CATH AND CORONARY ANGIOGRAPHY N/A 08/20/2019   Procedure: LEFT HEART CATH AND CORONARY ANGIOGRAPHY;  Surgeon: Belva Crome, MD;  Location: [facility];  Service: Cardiovascular;  Laterality: N/A;   RIGHT AND LEFT HEART CATH N/A 07/13/2021   Procedure: RIGHT AND LEFT HEART CATH;  Surgeon: Early Osmond, MD;  Location: [facility];  Service: Cardiovascular;  Laterality: N/A;   TEE WITHOUT CARDIOVERSION N/A 09/02/2019   Procedure: TRANSESOPHAGEAL ECHOCARDIOGRAM (TEE);  Surgeon: Lajuana Matte, MD;  Location: [facility];  Service: Open Heart Surgery;  Laterality: N/A;   TONSILECTOMY, ADENOIDECTOMY, BILATERAL MYRINGOTOMY AND TUBES  1965    TONSILLECTOMY      Family History  Problem Relation Age of Onset   Heart disease Father    Cancer Sister    Tongue cancer Sister    Colon polyps Neg Hx    Rectal cancer Neg Hx    Stomach cancer Neg Hx     Social History   Socioeconomic History   Marital status: Married    Spouse name: Richard   Number of children: 1   Years of education: 12   Highest education level: High school graduate  Occupational History   Occupation: Retire-Textile    Employer: RETIRED  Tobacco Use   Smoking status: Former    Packs/day: 1.00    Years: 45.00    Total pack years: 45.00    Types: Cigarettes  Quit date: 07/27/2005    Years since quitting: 16.1   Smokeless tobacco: Former    Quit date: 03/2005  Vaping Use   Vaping Use: Never used  Substance and Sexual Activity   Alcohol use: Not Currently    Comment: beer occasionally    Drug use: No   Sexual activity: Not Currently    Partners: Male    Comment: 1st intercourse-22, partners- 5, married- 34 yrs   Other Topics Concern   Not on file  Social History Narrative   Health Care POA:    Emergency Contact: husband, Verda Mehta   End of Life Plan:    Who lives with you: Lives with husband   Any pets: gold fish   Diet: Patient has a varied diet and reports stuggeling with portion size and ice cream.   Exercise: Patient does not have currently exercise routine.   Seatbelts: Patient reports wearing seatbelt when in vehicle.   Sun Exposure/Protection: Patient reports not using sun protection.   Hobbies: Bingo         Social Determinants of Health   Financial Resource Strain: Low Risk  (03/11/2021)   Overall Financial Resource Strain (CARDIA)    Difficulty of Paying Living Expenses: Not hard at all  Food Insecurity: No Food Insecurity (03/11/2021)   Hunger Vital Sign    Worried About Running Out of Food in the Last Year: Never true    Ran Out of Food in the Last Year: Never true  Transportation Needs: No Transportation Needs  (03/11/2021)   PRAPARE - Hydrologist (Medical): No    Lack of Transportation (Non-Medical): No  Physical Activity: Inactive (03/11/2021)   Exercise Vital Sign    Days of Exercise per Week: 0 days    Minutes of Exercise per Session: 0 min  Stress: No Stress Concern Present (03/11/2021)   [place]    Feeling of Stress : Not at all  Social Connections: Not on file  Intimate Partner Violence: Not on file      Review of systems: All other review of systems negative except as mentioned in the HPI.   Physical Exam: Vitals:   09/28/21 1059  BP: 96/68  Pulse: 90   Body mass index is 26.75 kg/m. Gen:      No acute distress in wheelchair  HEENT:  sclera anicteric Abd:      soft, non-tender; no palpable masses, no distension Ext:    No edema Neuro: alert and oriented x 3 Psych: normal mood and affect  Data Reviewed:  Reviewed labs, radiology imaging, old records and pertinent past GI work up   Assessment and Plan/Recommendations:  81 year old very pleasant female with history of type 2 diabetes, hypertension, CAD, chronic IBS diarrhea and pancreatic insufficiency   She is having decreased appetite and progressive weight loss, will plan to obtain CT abdomen pancreatic protocol to exclude any neoplastic lesion or acute pathology even though she does not have any GI specific symptoms Pancreatic insufficiency  Continue Creon 72,000 units with meals and 36,000 units with snacks Advised patient to eat small frequent meals with high-protein diet  Return in 1 year or sooner if needed    This visit required >30 minutes of patient care (this includes precharting, chart review, review of results, face-to-face time used for counseling as well as treatment plan and follow-up. The patient was provided an opportunity to ask questions and all were answered. The patient  agreed with the plan and  demonstrated an understanding of the instructions.  Damaris Hippo , MD    CC: Tysinger, Camelia Eng, PA-C

## 2021-09-28 NOTE — ED Triage Notes (Signed)
Pt in from home via [facility] with sob, worsened after showering tonight. Wears 4LNC at home, hx of COPD. Reports some chest tightness initially, and EMS states sats were 80% while wearing her 4L. Placed on NRB, and she eventually recovered to 94% on ED arrival. Lungs clear throughout, no other trx administered. Able to speak in full sentences

## 2021-09-28 NOTE — ED Provider Notes (Signed)
[facility] EMERGENCY DEPARTMENT Provider Note   CSN: [id number] Arrival date & time: 09/28/21  2111     History  Chief Complaint  Patient presents with   Shortness of Breath    Jessica Hart is a 81 y.o. female with a PMHx of HTN, DM, who presents to the ED complaining of shortness of breath onset tonight. Notes that after having a bowel movement she began to feel diaphoretic as well as shortness of breath and chest tightness.  Denies hitting her head or LOC at the time.  She notes she had a history of similar symptoms before in the past when she was evaluated by paramedics.  Also notes approximately 15 minutes of sternal chest tightness that has resolved at this time.  No meds tried prior to arrival.  Denies abdominal pain, nausea, vomiting. History of CABG with 3 stents placed.  History of hypertension and diabetes as well as COPD.  Patient is actively on 4.5-5 L of oxygen 24/7.  Her pulmonologist is Dr. Silas Flood and she is also followed by cardiology.    The history is provided by the patient. No language interpreter was used.       Home Medications Prior to Admission medications   Medication Sig Start Date End Date Taking? Authorizing Provider  acetaminophen (TYLENOL) 500 MG tablet Take 2 tablets (1,000 mg total) by mouth every 8 (eight) hours as needed. 09/07/19   Nani Skillern, PA-C  aspirin EC 81 MG tablet Take 1 tablet (81 mg total) by mouth daily. Swallow whole. 02/09/21   Tysinger, Camelia Eng, PA-C  Cholecalciferol (D3 5000) 125 MCG (5000 UT) capsule Take 5,000 Units by mouth daily.    [provider]  clotrimazole-betamethasone (LOTRISONE) cream Apply 1 application topically daily. 02/08/21   Tysinger, Camelia Eng, PA-C  CREON [id number] units CPEP capsule TAKE 2 CAPSULES BY MOUTH 3 TIMES A DAY WITH MEALS AND TAKE 1 CAPSULE WITH EACH SNACK TWICE DAILY 06/06/21   Nandigam, Venia Minks, MD  FARXIGA 5 MG TABS tablet TAKE 1 TABLET (5 MG TOTAL) BY  MOUTH DAILY. 08/29/21   Tysinger, Camelia Eng, PA-C  furosemide (LASIX) 20 MG tablet TAKE 3 TABLETS BY MOUTH EVERY DAY 09/12/21   Tysinger, Camelia Eng, PA-C  glucose blood test strip Use as instructed 08/17/21   Tysinger, Camelia Eng, PA-C  glucose blood test strip 1 each by Other route daily. Test glucose daily 08/19/21   Tysinger, Camelia Eng, PA-C  insulin degludec (TRESIBA FLEXTOUCH) 100 UNIT/ML FlexTouch Pen Inject 15 Units into the skin daily after supper. 07/12/21   Tysinger, Camelia Eng, PA-C  Insulin Pen Needle (PEN NEEDLES 5/16") 30G X 8 MM MISC Pen needles used for lantus injections 05/04/15   Henson, Vickie L, NP-C  Insulin Pen Needle (PEN NEEDLES) 32G X 4 MM MISC 1 each by Other route daily. 02/09/21   Tysinger, Camelia Eng, PA-C  Lancets (ONETOUCH DELICA PLUS [id number]) MISC USE TO TEST BLOOD SUGAR ONCE A DAY 08/19/21   Tysinger, Camelia Eng, PA-C  Lancets [facility] ULTRASOFT) lancets Use as instructed 08/17/21   Tysinger, Camelia Eng, PA-C  losartan (COZAAR) 50 MG tablet TAKE 1 TABLET BY MOUTH EVERY DAY 07/27/21   Tysinger, Camelia Eng, PA-C  metFORMIN (GLUCOPHAGE-XR) 750 MG 24 hr tablet TAKE 1 TABLET BY MOUTH EVERY DAY WITH BREAKFAST 04/19/21   Tysinger, Camelia Eng, PA-C  metoprolol tartrate (LOPRESSOR) 25 MG tablet Take 37.5 mg by mouth 2 (two) times daily.    [provider]  metoprolol tartrate (LOPRESSOR) 25 MG tablet Take 37.5 mg by mouth 2 (two) times daily.    [provider]  Misc. Devices MISC SIG: 1 L of Oxygen via Nasal cannula around the clock Dispense Home Oxygen and Portable tank and necessary supplies. Dx: Hypoxemia, Shortness of breath Patient taking differently: SIG: 3.5 L of Oxygen via Nasal cannula around the clock Dispense Home Oxygen and Portable tank and necessary supplies. Dx: Hypoxemia, Shortness of breath 02/09/21   Tysinger, Camelia Eng, PA-C  Multiple Vitamins-Minerals (NO IRON MULT VITAMIN-MINERALS) TABS TAKE 1 TABLET BY MOUTH DAILY 08/19/21   Tysinger, Camelia Eng, PA-C  omeprazole (PRILOSEC) 40  MG capsule TAKE 1 CAPSULE BY MOUTH EVERY DAY 04/19/21   Tysinger, Camelia Eng, PA-C  OneTouch Delica Lancets 24P MISC USE TO TEST BLOOD SUGAR ONCE A DAY 12/26/19   Henson, Vickie L, NP-C  ONETOUCH VERIO test strip USE 1 STRIP TO CHECK GLUCOSE ONCE DAILY 09/28/20   Henson, Vickie L, NP-C  potassium chloride (KLOR-CON) 10 MEQ tablet Take 2 tablets (20 mEq total) by mouth daily. 07/17/21   Arrien, Jimmy Picket, MD  rosuvastatin (CRESTOR) 20 MG tablet Take 1 tablet (20 mg total) by mouth daily. 02/09/21 08/17/22  Tysinger, Camelia Eng, PA-C  TRELEGY ELLIPTA 100-62.5-25 MCG/ACT AEPB TAKE 1 PUFF BY MOUTH EVERY DAY 08/26/21   Tysinger, Camelia Eng, PA-C  vitamin B-12 (CYANOCOBALAMIN) 500 MCG tablet Take 500 mcg by mouth daily.    [provider]      Allergies    Lisinopril    Review of Systems   Review of Systems  Respiratory:  Positive for shortness of breath.   Cardiovascular:  Positive for chest pain.  Gastrointestinal:  Negative for abdominal pain, nausea and vomiting.  All other systems reviewed and are negative.   Physical Exam Updated Vital Signs BP 111/65   Pulse 77   Temp (!) 97.5 F (36.4 C) (Oral)   Resp 18   Wt 68.5 kg   SpO2 95%   BMI 26.75 kg/m  Physical Exam Vitals and nursing note reviewed.  Constitutional:      General: She is not in acute distress.    Appearance: She is not diaphoretic.  HENT:     Head: Normocephalic and atraumatic.     Mouth/Throat:     Pharynx: No oropharyngeal exudate.  Eyes:     General: No scleral icterus.    Conjunctiva/sclera: Conjunctivae normal.  Cardiovascular:     Rate and Rhythm: Normal rate and regular rhythm.     Pulses: Normal pulses.     Heart sounds: Normal heart sounds.  Pulmonary:     Effort: Pulmonary effort is normal. No respiratory distress.     Breath sounds: Normal breath sounds. No wheezing.  Abdominal:     General: Bowel sounds are normal.     Palpations: Abdomen is soft. There is no mass.     Tenderness: There is  no abdominal tenderness. There is no guarding or rebound.  Musculoskeletal:        General: Normal range of motion.     Cervical back: Normal range of motion and neck supple.  Skin:    General: Skin is warm and dry.  Neurological:     Mental Status: She is alert.  Psychiatric:        Behavior: Behavior normal.     ED Results / Procedures / Treatments   Labs (all labs ordered are listed, but only abnormal results are displayed) Labs Reviewed  BASIC  METABOLIC PANEL - Abnormal; Notable for the following components:      Result Value   CO2 20 (*)    Glucose, Bld 158 (*)    Creatinine, Ser 1.42 (*)    GFR, Estimated 38 (*)    All other components within normal limits  CBC WITH DIFFERENTIAL/PLATELET - Abnormal; Notable for the following components:   RBC 5.40 (*)    Hemoglobin 15.1 (*)    HCT 47.3 (*)    RDW 18.0 (*)    All other components within normal limits  BRAIN NATRIURETIC PEPTIDE - Abnormal; Notable for the following components:   B Natriuretic Peptide 1,007.8 (*)    All other components within normal limits  TROPONIN I (HIGH SENSITIVITY) - Abnormal; Notable for the following components:   Troponin I (High Sensitivity) 23 (*)    All other components within normal limits  TROPONIN I (HIGH SENSITIVITY)    EKG EKG Interpretation  Date/Time:  Wednesday September 28 2021 21:14:54 EDT Ventricular Rate:  80 PR Interval:  180 QRS Duration: 97 QT Interval:  424 QTC Calculation: 490 R Axis:   73 Text Interpretation: Sinus rhythm Right atrial enlargement Inferior infarct, age indeterminate Anterior Q waves, possibly due to LVH No significant change since prior 6/23 Confirmed by Aletta Edouard [phone]) on 09/28/2021 9:17:12 PM  Radiology DG Chest 2 View  Result Date: 09/28/2021 CLINICAL DATA:  Shortness of breath. EXAM: CHEST - 2 VIEW COMPARISON:  Chest x-ray 07/12/2021 FINDINGS: The heart is enlarged, unchanged. Sternotomy wires are again seen. There is no focal lung  infiltrate, pleural effusion or pneumothorax. No acute fractures are seen. IMPRESSION: 1. No acute cardiopulmonary process. 2. Stable cardiomegaly. Electronically Signed   By: Ronney Asters M.D.   On: 09/28/2021 21:59    Procedures Procedures    Medications Ordered in ED Medications - No data to display  ED Course/ Medical Decision Making/ A&P Clinical Course as of 09/28/21 2335  Wed Sep 28, 4548  5068 81 year old female with significant COPD 4 L here with increased shortness of breath after shower bowel movement.  Initially required nonrebreather back on her 4 L baseline.  Otherwise speaking in full sentences.  Getting lab work chest x-ray EKG.  Disposition per results of testing. [MB]  2252 Pt re-evaluated and noted no chest pain at this time. Pt has been compliant with her lasix [SB]  2331 Consult with Cardiologist, Dr. Marcelle Smiling who will be available for consult. Also will provide additional recommendations pending delta troponin. [SB]  2333 Consult with Hospitalist, Dr. Posey Pronto who will evaluate the patient for admission. [SB]    Clinical Course User Index [MB] Hayden Rasmussen, MD [SB] Camdyn Laden A, PA-C                           Medical Decision Making Amount and/or Complexity of Data Reviewed Labs: ordered. Radiology: ordered.  Risk Decision regarding hospitalization.   Patient presents to the ED complaining of shortness of breath onset today.  Also had an episode of chest tightness prior to arrival.  Has a history of a CABG with stents placed.  History of hypertension and diabetes as well as COPD.  Typically wears 4.5-5 L of oxygen 24/7.  Is closely followed with pulmonology and cardiology.  Patient initial vital signs, hypoxic at 80% prior to nasal cannula oxygen supplementation on 4 L.  O2 improved to 95% while on oxygen.  On exam patient without acute cardiovascular, respiratory, abdominal  exam findings.  Differential diagnosis includes CHF exacerbation, ACS, PTX,  pneumonia.   Co morbidities that complicate the patient evaluation: Status post CABG x3 Hypertension Diabetes  Additional history obtained:  External records from outside source obtained and reviewed including: Patient was evaluated and admitted to the hospital in February for similar concerns.  Labs:  I ordered, and personally interpreted labs.  The pertinent results include:   Initial troponin at 23, delta troponin ordered, however not obtained prior to admission. CBC without leukocytosis, hemoglobin elevated at 15.1 otherwise unremarkable. BMP with slightly elevated glucose at 158, creatinine elevated at 1.42, likely AKI. BNP elevated today at 1007.8.  Increased from previous value 2 months ago at 727.3.  Imaging: I ordered imaging studies including chest x-ray to I independently visualized and interpreted imaging which showed:  1. No acute cardiopulmonary process.  2. Stable cardiomegaly.   I agree with the radiologist interpretation   Consultations: I requested consultation with the cardiologist, Dr. Marcelle Smiling, and discussed lab and imaging findings as well as pertinent plan - they recommend: We will be available for consult and will be able to provide further recommendations pending delta troponin -Consultation with hospitalist, Dr. Posey Pronto who will evaluate the patient for admission.  Disposition: Presentation suspicious for CHF exacerbation.  Also suspicious for AKI.  At this time unable to fully rule out ACS, initial troponin elevated at 23, delta troponin pending at time of admission.  Likely patient will need to have troponins cycled while admitted to the hospital.  Troponins could be elevated in the setting of CHF exacerbation however, can be further ruled out with cycling of troponins.  Doubt pneumonia or pneumothorax at this time. After consideration of the diagnostic results and the patients response to treatment, I feel that the patient would benefit from Admission to the  hospital.  Discussed with patient admission plans.  Patient agreeable this time.  Patient appears safe for admission.   This chart was dictated using voice recognition software, Dragon. Despite the best efforts of this provider to proofread and correct errors, errors may still occur which can change documentation meaning.  Final Clinical Impression(s) / ED Diagnoses Final diagnoses:  SOB (shortness of breath)    Rx / DC Orders ED Discharge Orders     None         Jawanza Zambito A, PA-C 09/29/21 0003    Hayden Rasmussen, MD 09/29/21 [phone]

## 2021-09-28 NOTE — Discharge Instructions (Signed)
It was a pleasure taking care of you today!  Your labs, imaging, and EKG were unremarkable today. It is important that you follow up with your primary care provider regarding todays ED visit.  Call your pulmonologist to set up a follow up appointment regarding todays ED visit. Return to the ED if you are experiencing increasing/worsening trouble breathing, chest pain, or worsening symptoms.

## 2021-09-29 DIAGNOSIS — N179 Acute kidney failure, unspecified: Secondary | ICD-10-CM | POA: Diagnosis not present

## 2021-09-29 DIAGNOSIS — E119 Type 2 diabetes mellitus without complications: Secondary | ICD-10-CM | POA: Diagnosis not present

## 2021-09-29 DIAGNOSIS — R55 Syncope and collapse: Secondary | ICD-10-CM

## 2021-09-29 DIAGNOSIS — I152 Hypertension secondary to endocrine disorders: Secondary | ICD-10-CM | POA: Diagnosis not present

## 2021-09-29 DIAGNOSIS — I509 Heart failure, unspecified: Secondary | ICD-10-CM

## 2021-09-29 DIAGNOSIS — Z951 Presence of aortocoronary bypass graft: Secondary | ICD-10-CM | POA: Diagnosis not present

## 2021-09-29 DIAGNOSIS — K8689 Other specified diseases of pancreas: Secondary | ICD-10-CM

## 2021-09-29 DIAGNOSIS — J9611 Chronic respiratory failure with hypoxia: Secondary | ICD-10-CM

## 2021-09-29 DIAGNOSIS — Z794 Long term (current) use of insulin: Secondary | ICD-10-CM

## 2021-09-29 DIAGNOSIS — E1169 Type 2 diabetes mellitus with other specified complication: Secondary | ICD-10-CM | POA: Diagnosis not present

## 2021-09-29 DIAGNOSIS — E785 Hyperlipidemia, unspecified: Secondary | ICD-10-CM | POA: Diagnosis not present

## 2021-09-29 DIAGNOSIS — E1159 Type 2 diabetes mellitus with other circulatory complications: Secondary | ICD-10-CM

## 2021-09-29 LAB — BASIC METABOLIC PANEL
Anion gap: 13 (ref 5–15)
BUN: 21 mg/dL (ref 8–23)
CO2: 23 mmol/L (ref 22–32)
Calcium: 9.6 mg/dL (ref 8.9–10.3)
Chloride: 105 mmol/L (ref 98–111)
Creatinine, Ser: 1.11 mg/dL — ABNORMAL HIGH (ref 0.44–1.00)
GFR, Estimated: 51 mL/min — ABNORMAL LOW (ref >60)
Glucose, Bld: 106 mg/dL — ABNORMAL HIGH (ref 70–99)
Potassium: 3.7 mmol/L (ref 3.5–5.1)
Sodium: 141 mmol/L (ref 135–145)

## 2021-09-29 LAB — CBC
HCT: 46.3 % — ABNORMAL HIGH (ref 36.0–46.0)
Hemoglobin: 15.3 g/dL — ABNORMAL HIGH (ref 12.0–15.0)
MCH: 28.3 pg (ref 26.0–34.0)
MCHC: 33 g/dL (ref 30.0–36.0)
MCV: 85.6 fL (ref 80.0–100.0)
Platelets: 210 10*3/uL (ref 150–400)
RBC: 5.41 MIL/uL — ABNORMAL HIGH (ref 3.87–5.11)
RDW: 18.1 % — ABNORMAL HIGH (ref 11.5–15.5)
WBC: 8.2 10*3/uL (ref 4.0–10.5)
nRBC: 0 % (ref 0.0–0.2)

## 2021-09-29 LAB — CBG MONITORING, ED
Glucose-Capillary: 150 mg/dL — ABNORMAL HIGH (ref 70–99)
Glucose-Capillary: 79 mg/dL (ref 70–99)

## 2021-09-29 LAB — GLUCOSE, CAPILLARY
Glucose-Capillary: 177 mg/dL — ABNORMAL HIGH (ref 70–99)
Glucose-Capillary: 127 mg/dL — ABNORMAL HIGH (ref 70–99)

## 2021-09-29 LAB — MAGNESIUM: Magnesium: 2.2 mg/dL (ref 1.7–2.4)

## 2021-09-29 LAB — TROPONIN I (HIGH SENSITIVITY): Troponin I (High Sensitivity): 25 ng/L — ABNORMAL HIGH (ref <18)

## 2021-09-29 MED ORDER — ONDANSETRON HCL 4 MG PO TABS: 4.0000 mg | ORAL_TABLET | Freq: Four times a day (QID) | ORAL | Status: DC | PRN

## 2021-09-29 MED ORDER — ACETAMINOPHEN 650 MG RE SUPP: 650.0000 mg | Freq: Four times a day (QID) | RECTAL | Status: DC | PRN

## 2021-09-29 MED ORDER — ENOXAPARIN SODIUM 30 MG/0.3ML IJ SOSY: 30.0000 mg | PREFILLED_SYRINGE | INTRAMUSCULAR | Status: DC

## 2021-09-29 MED ORDER — FLUTICASONE FUROATE-VILANTEROL 100-25 MCG/ACT IN AEPB
1.0000 | INHALATION_SPRAY | Freq: Every day | RESPIRATORY_TRACT | Status: DC
  Filled 2021-09-29 (×2): qty 28

## 2021-09-29 MED ORDER — INSULIN GLARGINE-YFGN 100 UNIT/ML ~~LOC~~ SOLN
10.0000 [IU] | Freq: Every day | SUBCUTANEOUS | Status: DC
  Administered 2021-09-29: 10 [IU] via SUBCUTANEOUS
  Filled 2021-09-29 (×2): qty 0.1

## 2021-09-29 MED ORDER — UMECLIDINIUM BROMIDE 62.5 MCG/ACT IN AEPB
1.0000 | INHALATION_SPRAY | Freq: Every day | RESPIRATORY_TRACT | Status: DC
  Filled 2021-09-29 (×2): qty 7

## 2021-09-29 MED ORDER — INSULIN ASPART 100 UNIT/ML IJ SOLN
0.0000 [IU] | Freq: Three times a day (TID) | INTRAMUSCULAR | Status: DC
  Administered 2021-09-29: 1 [IU] via SUBCUTANEOUS
  Administered 2021-09-29: 2 [IU] via SUBCUTANEOUS

## 2021-09-29 MED ORDER — SODIUM CHLORIDE 0.9% FLUSH
3.0000 mL | Freq: Two times a day (BID) | INTRAVENOUS | Status: DC
  Administered 2021-09-29 – 2021-09-30 (×3): 3 mL via INTRAVENOUS

## 2021-09-29 MED ORDER — POTASSIUM CHLORIDE CRYS ER 10 MEQ PO TBCR
20.0000 meq | EXTENDED_RELEASE_TABLET | Freq: Every day | ORAL | Status: DC
  Administered 2021-09-29 – 2021-09-30 (×2): 20 meq via ORAL
  Filled 2021-09-29 (×2): qty 2

## 2021-09-29 MED ORDER — PANCRELIPASE (LIP-PROT-AMYL) 12000-38000 UNITS PO CPEP
36000.0000 [IU] | ORAL_CAPSULE | Freq: Three times a day (TID) | ORAL | Status: DC
  Administered 2021-09-29 – 2021-09-30 (×3): 36000 [IU] via ORAL
  Filled 2021-09-29 (×4): qty 1
  Filled 2021-09-29: qty 3

## 2021-09-29 MED ORDER — ROSUVASTATIN CALCIUM 20 MG PO TABS
20.0000 mg | ORAL_TABLET | Freq: Every day | ORAL | Status: DC
  Administered 2021-09-29 – 2021-09-30 (×2): 20 mg via ORAL
  Filled 2021-09-29 (×2): qty 1

## 2021-09-29 MED ORDER — METOPROLOL TARTRATE 25 MG PO TABS
37.5000 mg | ORAL_TABLET | Freq: Two times a day (BID) | ORAL | Status: DC
  Administered 2021-09-29 – 2021-09-30 (×3): 37.5 mg via ORAL
  Filled 2021-09-29 (×2): qty 1
  Filled 2021-09-29: qty 2

## 2021-09-29 MED ORDER — ASPIRIN 81 MG PO TBEC
81.0000 mg | DELAYED_RELEASE_TABLET | Freq: Every day | ORAL | Status: DC
  Administered 2021-09-29 – 2021-09-30 (×2): 81 mg via ORAL
  Filled 2021-09-29 (×2): qty 1

## 2021-09-29 MED ORDER — FUROSEMIDE 10 MG/ML IJ SOLN
40.0000 mg | Freq: Once | INTRAMUSCULAR | Status: AC
Start: 2021-09-29 — End: 2021-09-29
  Administered 2021-09-29: 40 mg via INTRAVENOUS
  Filled 2021-09-29: qty 4

## 2021-09-29 MED ORDER — ENOXAPARIN SODIUM 40 MG/0.4ML IJ SOSY: 40.0000 mg | PREFILLED_SYRINGE | INTRAMUSCULAR | Status: DC

## 2021-09-29 MED ORDER — ONDANSETRON HCL 4 MG/2ML IJ SOLN: 4.0000 mg | Freq: Four times a day (QID) | INTRAMUSCULAR | Status: DC | PRN

## 2021-09-29 MED ORDER — ACETAMINOPHEN 325 MG PO TABS: 650.0000 mg | ORAL_TABLET | Freq: Four times a day (QID) | ORAL | Status: DC | PRN

## 2021-09-29 NOTE — Assessment & Plan Note (Signed)
Continue reduced Semglee 10 units qhs, add SSI.

## 2021-09-29 NOTE — Assessment & Plan Note (Signed)
Creatinine 1.42 on admission compared to baseline 0.9-1.0.  Monitor response to IV diuresis.  Hold losartan.

## 2021-09-29 NOTE — Assessment & Plan Note (Signed)
Continue rosuvastatin.

## 2021-09-29 NOTE — Assessment & Plan Note (Signed)
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  Last EF 65-70% by TTE 03/08/2021. -Give 1 dose IV Lasix 40 mg now, monitor response -Continue Lopressor 37.5 mg BID -Hold losartan for now to avoid hypotension -Monitor strict I/O's and daily weights

## 2021-09-29 NOTE — Care Management CC44 (Signed)
Condition Code 44 Documentation Completed  Patient Details  Name: Jessica Hart MRN: [id number] Date of Birth: Nov 30, 1940   Condition Code 44 given:  Yes Patient signature on Condition Code 44 notice:  Yes Documentation of 2 MD's agreement:  Yes Code 44 added to claim:  Yes    Laurena Slimmer, RN 09/29/2021, 5:16 PM

## 2021-09-29 NOTE — Assessment & Plan Note (Signed)
Continue aspirin, statin, Lopressor.

## 2021-09-29 NOTE — Hospital Course (Signed)
Jessica Hart is a 81 y.o. 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

## 2021-09-29 NOTE — Assessment & Plan Note (Signed)
Continue Creon with meals.

## 2021-09-29 NOTE — Care Management Obs Status (Signed)
[facility] NOTIFICATION   Patient Details  Name: Jessica Hart MRN: [id number] Date of Birth: 10-10-1940   Medicare Observation Status Notification Given:  Ernesta Amble, RN 09/29/2021, 5:16 PM

## 2021-09-29 NOTE — Progress Notes (Signed)
Heart Failure Navigator Progress Note  Assessed for Heart & Vascular TOC clinic readiness.  Patient does not meet criteria due to already established with advanced HF team.   Jessica Hart, PharmD, BCPS Heart Failure Stewardship Pharmacist Phone [phone]    

## 2021-09-29 NOTE — Progress Notes (Signed)
Pt doesn't feel comfortable w/ d/c tonight. Attempted to call MD regarding patient concerns. No new orders at this time. Reached out to night DO regarding pt  concerns. Night nurse notified. Will continue to monitor. Will continue POC

## 2021-09-29 NOTE — Assessment & Plan Note (Signed)
Continue Lopressor, holding losartan as above.

## 2021-09-29 NOTE — Progress Notes (Signed)
Mobility Specialist Progress Note:   09/29/21 1624  Mobility  Activity Ambulated with assistance in hallway  Level of Assistance Standby assist, set-up cues, supervision of patient - no hands on  Assistive Device Front wheel walker  Distance Ambulated (ft) 200 ft  Activity Response Tolerated well  $Mobility charge 1 Mobility   Pt received in bed willing to participate in mobility. No complaints of pain. Left in bed with call bell in reach and all needs met.   [facility] Mayla Biddy Mobility Specialist

## 2021-09-29 NOTE — Discharge Summary (Signed)
Physician Discharge Summary  Jessica Hart MWU:[id number] DOB: 1940/04/29 DOA: 09/28/2021  PCP: Carlena Hurl, PA-C  Admit date: 09/28/2021 Discharge date: 09/29/2021 Discharging to: Home Recommendations for Outpatient Follow-up:  Follow-up on constipation  Consults:  None Procedures:  None   Discharge Diagnoses:   Principal Problem:   Vasovagal episode Active Problems:   Acute on chronic heart failure with preserved ejection fraction (HFpEF) (HCC)   AKI (acute kidney injury) ([place])   Chronic respiratory failure with hypoxia (HCC)   S/P CABG x 3   Hypertension associated with diabetes ([place])   Insulin dependent type 2 diabetes mellitus ([place])   Hyperlipidemia associated with type 2 diabetes mellitus [facility])   Pancreatic insufficiency     Hospital Course:  This is a 81 year old female with chronic HFpEF, RV failure, pulmonary hypertension, chronic hypoxic respiratory failure on 4.5 to 5 L of oxygen, interstitial lung disease/COPD, CAD status post CABG, pancreatic insufficiency.  The patient states that she has been constipated and she took laxatives for 2 days.  Yesterday she felt the urge to go to the bathroom and she barely made it to the toilet just before she sat, she began to have stool and then suddenly felt lightheaded, broke out into a sweat, felt short of breath and felt like she was going to faint.  Her husband had to help her back into bed and clean her up.  She continued to feel unwell with multiple complaints for at least the next 15 minutes. In the ED, pulse ox was 95% on her 4 L, chest x-ray was negative.  Creatinine was slightly elevated at 1.42 from a baseline of 0.9. She was given 1 dose of Lasix IV 40 mg and admitted to the hospital.  Principal Problem:   Vasovagal episode - She she gives a very good history of with sounds like a vasovagal episode occurring during an episode of diarrhea prompted by 2 days of laxative use - She is not orthostatic today - She  has been able to ambulate 200 feet without symptoms  Active Problems:   Acute on chronic heart failure with preserved ejection fraction (HFpEF) (HCC) - No signs of acute heart failure today-resume home medication    AKI (acute kidney injury) ([place]) - Creatinine improved to 1.11    Chronic respiratory failure with hypoxia (HCC)   S/P CABG x 3   Hypertension associated with diabetes ([place])   Insulin dependent type 2 diabetes mellitus ([place])   Hyperlipidemia associated with type 2 diabetes mellitus ([place])   Pancreatic insufficiency         Discharge Instructions  Discharge Instructions     Diet - low sodium heart healthy   Complete by: As directed    Diet Carb Modified   Complete by: As directed    Increase activity slowly   Complete by: As directed       Allergies as of 09/29/2021       Reactions   Lisinopril Itching, Cough        Medication List     TAKE these medications    acetaminophen 500 MG tablet Commonly known as: TYLENOL Take 2 tablets (1,000 mg total) by mouth every 8 (eight) hours as needed.   aspirin EC 81 MG tablet Take 1 tablet (81 mg total) by mouth daily. Swallow whole.   Creon 36000 UNITS Cpep capsule Generic drug: lipase/protease/amylase TAKE 2 CAPSULES BY MOUTH 3 TIMES A DAY WITH MEALS AND TAKE 1 CAPSULE WITH EACH SNACK TWICE DAILY What  changed: See the new instructions.   cyanocobalamin 500 MCG tablet Commonly known as: VITAMIN B12 Take 500 mcg by mouth daily.   D3 5000 125 MCG (5000 UT) capsule Generic drug: Cholecalciferol Take 5,000 Units by mouth daily.   Farxiga 5 MG Tabs tablet Generic drug: dapagliflozin propanediol TAKE 1 TABLET (5 MG TOTAL) BY MOUTH DAILY. What changed: how much to take   furosemide 20 MG tablet Commonly known as: LASIX TAKE 3 TABLETS BY MOUTH EVERY DAY   losartan 50 MG tablet Commonly known as: COZAAR TAKE 1 TABLET BY MOUTH EVERY DAY   metFORMIN 750 MG 24 hr tablet Commonly known as:  GLUCOPHAGE-XR TAKE 1 TABLET BY MOUTH EVERY DAY WITH BREAKFAST What changed:  how much to take how to take this when to take this additional instructions   metoprolol tartrate 25 MG tablet Commonly known as: LOPRESSOR Take 25 mg by mouth 2 (two) times daily.   Misc. Devices Misc SIG: 1 L of Oxygen via Nasal cannula around the clock Dispense Home Oxygen and Portable tank and necessary supplies. Dx: Hypoxemia, Shortness of breath What changed:  how much to take how to take this when to take this additional instructions   No Iron Mult Vitamin-Minerals Tabs TAKE 1 TABLET BY MOUTH DAILY   omeprazole 40 MG capsule Commonly known as: PRILOSEC TAKE 1 CAPSULE BY MOUTH EVERY DAY What changed:  how much to take how to take this when to take this additional instructions   potassium chloride 10 MEQ tablet Commonly known as: KLOR-CON Take 2 tablets (20 mEq total) by mouth daily.   rosuvastatin 20 MG tablet Commonly known as: CRESTOR Take 1 tablet (20 mg total) by mouth daily.   Trelegy Ellipta 100-62.5-25 MCG/ACT Aepb Generic drug: Fluticasone-Umeclidin-Vilant TAKE 1 PUFF BY MOUTH EVERY DAY What changed: See the new instructions.   Tyler Aas FlexTouch 100 UNIT/ML FlexTouch Pen Generic drug: insulin degludec Inject 15 Units into the skin daily after supper.        Follow-up Information     Tysinger, Camelia Eng, PA-C.   Specialty: Family Medicine Why: As needed Contact information: [place] [address] [phone]         [place].   Specialty: Emergency Medicine Why: If symptoms worsen Contact information: [address] [id number] [place] [phone]                   The results of significant diagnostics from this hospitalization (including imaging, microbiology, ancillary and laboratory) are listed below for reference.    DG Chest 2 View  Result  Date: 09/28/2021 CLINICAL DATA:  Shortness of breath. EXAM: CHEST - 2 VIEW COMPARISON:  Chest x-ray 07/12/2021 FINDINGS: The heart is enlarged, unchanged. Sternotomy wires are again seen. There is no focal lung infiltrate, pleural effusion or pneumothorax. No acute fractures are seen. IMPRESSION: 1. No acute cardiopulmonary process. 2. Stable cardiomegaly. Electronically Signed   By: Ronney Asters M.D.   On: 09/28/2021 21:59   Labs:   Basic Metabolic Panel: Recent Labs  Lab 09/28/21 2133 09/29/21 0427  NA 141 141  K 3.6 3.7  CL 107 105  CO2 20* 23  GLUCOSE 158* 106*  BUN 22 21  CREATININE 1.42* 1.11*  CALCIUM 9.2 9.6  MG  --  2.2     CBC: Recent Labs  Lab 09/28/21 2133 09/29/21 0427  WBC 7.6 8.2  NEUTROABS 5.5  --   HGB 15.1* 15.3*  HCT 47.3* 46.3*  MCV 87.6 85.6  PLT 197 210         SIGNED:   Debbe Odea, MD  [facility] 09/29/2021, 5:02 PM

## 2021-09-29 NOTE — Assessment & Plan Note (Signed)
Stable on home 4.5-5 L O2 via [place].

## 2021-09-29 NOTE — Progress Notes (Signed)
Orthostatic BPs BPs  HR  Supine 123/76  82  Sitting 124/79 82  Standing 130/84 84  Standing after 3 min 149/93 84

## 2021-09-30 ENCOUNTER — Telehealth: Payer: Self-pay | Admitting: Internal Medicine

## 2021-09-30 LAB — GLUCOSE, CAPILLARY
Glucose-Capillary: 90 mg/dL (ref 70–99)
Glucose-Capillary: 102 mg/dL — ABNORMAL HIGH (ref 70–99)

## 2021-09-30 NOTE — Telephone Encounter (Signed)
Jessica Hart  nurse from [facility] called and needs written orders due to patient insurance for Home PT and OT dx increased weakness, increased fatigue, decreased mobility. They are wanting to start services Monday with pt.   Please fax order to [facility] [phone]  Jessica- [phone], office number- [phone]    Sending to Dr. Redmond School since Audelia Acton is out. Please send to [facility] or Maudie Mercury to handle as I will be out of office too

## 2021-09-30 NOTE — TOC Transition Note (Addendum)
Transition of Care [facility]) - CM/SW Discharge Note   Patient Details  Name: Jessica Hart MRN: [id number] Date of Birth: [birth year]  Transition of Care [facility]) CM/SW Contact:  Zenon Mayo, RN Phone Number: 09/30/2021, 9:07 AM   Clinical Narrative:    Patient is for dc today, she states she needs [facility] services,  Per MD she does not need HH services, NCM will informed patient. .  She states her spouse will transport her at discharge, she is on 4.5 liters of oxygen at home.  This NCM  called spouse to let him know to bring oxygen to transport her home.    Final next level of care: [place] Barriers to Discharge: No Barriers Identified   Patient Goals and CMS Choice Patient states their goals for this hospitalization and ongoing recovery are:: return home CMS Medicare.gov Compare Post Acute Care list provided to:: Patient Choice offered to / list presented to : Patient  Discharge Placement                       Discharge Plan and Services                  DME Agency: NA       HH Arranged    Social Determinants of Health (SDOH) Interventions     Readmission Risk Interventions     No data to display

## 2021-09-30 NOTE — Progress Notes (Signed)
Pt NSR on monitor and A/O 4. OOB w/ 1 assist. Admitted for vasovagal episode.see note. Denies pain and SOB. 4L [place]. ([place] Education provided on follow up care.

## 2021-09-30 NOTE — Progress Notes (Signed)
Mobility Specialist Progress Note   09/30/21 1017  Mobility  Activity Ambulated with assistance in hallway  Level of Assistance Standby assist, set-up cues, supervision of patient - no hands on  Assistive Device Front wheel walker  Distance Ambulated (ft) 220 ft  Activity Response Tolerated well  $Mobility charge 1 Mobility   Pre Mobility: 96 HR, 119/86 BP, 87% SpO2 During Mobility: 102 HR, 92% Post Mobility: 97 HR, 125/89 BP, 89% SpO2  Received pt on 2LO2 sitting EOB having no complaint and agreeable. Pt desat during ambulation to 86% SpO2, temporally placed on 3LO2 per advisement of RN and SpO2 floated around 91%. x2 standing rest breaks d/t SOB but no faults throughout. Left on the EOB w/ needs met and back on 2LO2.   Holland Falling Mobility Specialist [facility] #:  [phone] Acute Rehab Office:  [phone]

## 2021-10-04 ENCOUNTER — Telehealth: Payer: Self-pay | Admitting: *Deleted

## 2021-10-04 ENCOUNTER — Encounter: Payer: Self-pay | Admitting: Gastroenterology

## 2021-10-04 ENCOUNTER — Other Ambulatory Visit: Payer: Self-pay | Admitting: Medical

## 2021-10-04 NOTE — Telephone Encounter (Signed)
Order was faxed over. [place]

## 2021-10-04 NOTE — Telephone Encounter (Signed)
FYI- Dr Silverio Decamp  ===View-only below this line===   ----- Message ----- From: Belva Chimes H Sent: 10/04/2021  12:58 PM EDT To: Oda Kilts, CMA Subject: FW: CT Abdomen with Pancreatic protocol         ----- Message ----- From: Cathlean Cower Sent: 10/04/2021  12:51 PM EDT To: Roosvelt Maser Subject: RE: CT Abdomen with Pancreatic protocol        8/4 pt is going to call and talk to MD before this test got call back she is not going to do it cd ----- Message ----- From: Roosvelt Maser Sent: 09/29/2021   8:32 AM EDT To: Cathlean Cower Subject: FW: CT Abdomen with Pancreatic protocol         ----- Message ----- From: Oda Kilts, CMA Sent: 09/28/2021  11:21 AM EDT To: Roosvelt Maser Subject: CT Abdomen with Pancreatic protocol            Orders are in for CT Abdomen with Pancreatic protocol for Pancreatic insuffiencey  Thank You

## 2021-10-06 ENCOUNTER — Telehealth: Payer: Self-pay

## 2021-10-06 ENCOUNTER — Other Ambulatory Visit (HOSPITAL_COMMUNITY): Payer: No Typology Code available for payment source

## 2021-10-06 DIAGNOSIS — I272 Pulmonary hypertension, unspecified: Secondary | ICD-10-CM | POA: Diagnosis not present

## 2021-10-06 DIAGNOSIS — I110 Hypertensive heart disease with heart failure: Secondary | ICD-10-CM | POA: Diagnosis not present

## 2021-10-06 DIAGNOSIS — E119 Type 2 diabetes mellitus without complications: Secondary | ICD-10-CM | POA: Diagnosis not present

## 2021-10-06 DIAGNOSIS — J9611 Chronic respiratory failure with hypoxia: Secondary | ICD-10-CM | POA: Diagnosis not present

## 2021-10-06 DIAGNOSIS — I50812 Chronic right heart failure: Secondary | ICD-10-CM | POA: Diagnosis not present

## 2021-10-06 DIAGNOSIS — I5033 Acute on chronic diastolic (congestive) heart failure: Secondary | ICD-10-CM | POA: Diagnosis not present

## 2021-10-06 DIAGNOSIS — Z794 Long term (current) use of insulin: Secondary | ICD-10-CM | POA: Diagnosis not present

## 2021-10-06 DIAGNOSIS — I2510 Atherosclerotic heart disease of native coronary artery without angina pectoris: Secondary | ICD-10-CM | POA: Diagnosis not present

## 2021-10-06 DIAGNOSIS — R55 Syncope and collapse: Secondary | ICD-10-CM | POA: Diagnosis not present

## 2021-10-06 DIAGNOSIS — Z7984 Long term (current) use of oral hypoglycemic drugs: Secondary | ICD-10-CM | POA: Diagnosis not present

## 2021-10-06 NOTE — Telephone Encounter (Signed)
Jessica Hart with [facility] called wanting verbal orders for PT once a week for 1 week, 2 times a week for 2 weeks, once a week for 3 weeks. She also mentioned that she had increased her oxygen to 4 liters because she noticed her oxygen going down to 88% with exertion. She can be reached at [phone]. I told her it would be Monday before I could call her back and she stated that's fine.

## 2021-10-10 ENCOUNTER — Ambulatory Visit (INDEPENDENT_AMBULATORY_CARE_PROVIDER_SITE_OTHER): Payer: No Typology Code available for payment source | Admitting: Medical

## 2021-10-10 VITALS — BP 120/60 | HR 85 | Resp 18 | Wt 148.8 lb

## 2021-10-10 DIAGNOSIS — I2510 Atherosclerotic heart disease of native coronary artery without angina pectoris: Secondary | ICD-10-CM | POA: Diagnosis not present

## 2021-10-10 DIAGNOSIS — R0602 Shortness of breath: Secondary | ICD-10-CM

## 2021-10-10 DIAGNOSIS — I110 Hypertensive heart disease with heart failure: Secondary | ICD-10-CM | POA: Diagnosis not present

## 2021-10-10 DIAGNOSIS — J3489 Other specified disorders of nose and nasal sinuses: Secondary | ICD-10-CM | POA: Diagnosis not present

## 2021-10-10 DIAGNOSIS — E119 Type 2 diabetes mellitus without complications: Secondary | ICD-10-CM | POA: Diagnosis not present

## 2021-10-10 DIAGNOSIS — I5032 Chronic diastolic (congestive) heart failure: Secondary | ICD-10-CM

## 2021-10-10 DIAGNOSIS — Z794 Long term (current) use of insulin: Secondary | ICD-10-CM | POA: Diagnosis not present

## 2021-10-10 DIAGNOSIS — I272 Pulmonary hypertension, unspecified: Secondary | ICD-10-CM | POA: Diagnosis not present

## 2021-10-10 DIAGNOSIS — J9611 Chronic respiratory failure with hypoxia: Secondary | ICD-10-CM | POA: Diagnosis not present

## 2021-10-10 DIAGNOSIS — R55 Syncope and collapse: Secondary | ICD-10-CM

## 2021-10-10 DIAGNOSIS — R198 Other specified symptoms and signs involving the digestive system and abdomen: Secondary | ICD-10-CM

## 2021-10-10 DIAGNOSIS — E1165 Type 2 diabetes mellitus with hyperglycemia: Secondary | ICD-10-CM

## 2021-10-10 DIAGNOSIS — Z7984 Long term (current) use of oral hypoglycemic drugs: Secondary | ICD-10-CM | POA: Diagnosis not present

## 2021-10-10 DIAGNOSIS — I5033 Acute on chronic diastolic (congestive) heart failure: Secondary | ICD-10-CM | POA: Diagnosis not present

## 2021-10-10 DIAGNOSIS — I50812 Chronic right heart failure: Secondary | ICD-10-CM | POA: Diagnosis not present

## 2021-10-10 NOTE — Progress Notes (Signed)
Subjective:  Jessica Hart is a 81 y.o. female who presents for Chief Complaint  Patient presents with   diabetes     Diabetes and then follow-up on Hospital due to SOB     Here for med check and hospital follow-up.  Diabetes-compliant with metformin XR 750 mg daily along with Tresiba 15 units daily.  Taking Farxiga 5 mg daily.  Blood sugars look fine at home well under 130 fasting.  No recent hypoglycemia.  No issues with medication.  No new foot issues.  She is on chronic oxygen with history of hypoxic chronic respiratory failure.  Her oxygen dries her nose out.  She was given some type of topical gel in the hospital that helped.  She would like to use some of this but she is not sure what to use.  Since the hospitalization no worse chest pain or shortness of breath but she does get a little winded with bathing.  Her husband helps her in the bathtub with bathing.  She was hospitalized recently after having a faint feeling episode after had taken some laxatives.  She notes that she normally has multiple bowel movements a day but the day she took the laxative she had not had a bowel movement in like 3 days.  This is not typical for her to take a laxative.  Her bowels seem back to normal currently.  That day she felt lightheaded after doing the laxatives and having a large bowel movement.  She is compliant with her medicines including 3 Lasix daily.  No significant swelling of the legs but does get a little.  No other aggravating or relieving factors.    No other c/o.  Past Medical History:  Diagnosis Date   Arthritis    Cataract bilaterl 3 years ago   Coronary artery disease    Diabetes mellitus 02/27/2005   Diabetes mellitus type 2, controlled, without complications ([place]) 8/85/0277   Qualifier: Diagnosis of  By: Drucie Ip     Diverticulosis    Dyspnea    Echocardiogram findings abnormal, without diagnosis 2005   EF 47%, no ischemia, no infarction   Former smoker  01/10/2017   45 pack year history, quit in 2007    GERD (gastroesophageal reflux disease)    Heart murmur    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   History of bone density study 2006   Lt hip = -1.0, L spine = -1.8    Hyperlipidemia    Hypertension    Current Outpatient Medications on File Prior to Visit  Medication Sig Dispense Refill   acetaminophen (TYLENOL) 500 MG tablet Take 2 tablets (1,000 mg total) by mouth every 8 (eight) hours as needed. 30 tablet 2   aspirin EC 81 MG tablet Take 1 tablet (81 mg total) by mouth daily. Swallow whole. 90 tablet 3   Cholecalciferol (D3 5000) 125 MCG (5000 UT) capsule Take 5,000 Units by mouth daily.     CREON 36000-114000 units CPEP capsule TAKE 2 CAPSULES BY MOUTH 3 TIMES A DAY WITH MEALS AND TAKE 1 CAPSULE WITH EACH SNACK TWICE DAILY (Patient taking differently: Take 36,000 Units by mouth 3 (three) times daily with meals.) 240 capsule 11   FARXIGA 5 MG TABS tablet TAKE 1 TABLET (5 MG TOTAL) BY MOUTH DAILY. (Patient taking differently: Take 5 mg by mouth daily.) 90 tablet 1   furosemide (LASIX) 20 MG tablet TAKE 3 TABLETS BY MOUTH EVERY DAY (Patient taking differently: Take 60 mg by mouth daily.) 90 tablet  0   insulin degludec (TRESIBA FLEXTOUCH) 100 UNIT/ML FlexTouch Pen Inject 15 Units into the skin daily after supper. 3 mL 5   losartan (COZAAR) 50 MG tablet TAKE 1 TABLET BY MOUTH EVERY DAY (Patient taking differently: Take 50 mg by mouth daily.) 90 tablet 1   metFORMIN (GLUCOPHAGE-XR) 750 MG 24 hr tablet TAKE 1 TABLET BY MOUTH EVERY DAY WITH BREAKFAST (Patient taking differently: Take 750 mg by mouth daily with breakfast.) 90 tablet 1   metoprolol tartrate (LOPRESSOR) 25 MG tablet Take 25 mg by mouth 2 (two) times daily.     Misc. Devices MISC SIG: 1 L of Oxygen via Nasal cannula around the clock Dispense Home Oxygen and Portable tank and necessary supplies. Dx: Hypoxemia, Shortness of breath (Patient taking differently: Place 4-4.5 L into the nose See admin  instructions. 4 - 4.5 L of Oxygen via Nasal cannula around the clock Dispense Home Oxygen and Portable tank and necessary supplies. Dx: Hypoxemia, Shortness of breath) 1 Device 0   Multiple Vitamins-Minerals (NO IRON MULT VITAMIN-MINERALS) TABS TAKE 1 TABLET BY MOUTH DAILY (Patient taking differently: Take 1 tablet by mouth daily.) 30 tablet 0   omeprazole (PRILOSEC) 40 MG capsule TAKE 1 CAPSULE BY MOUTH EVERY DAY (Patient taking differently: Take 40 mg by mouth daily.) 90 capsule 1   potassium chloride (KLOR-CON) 10 MEQ tablet Take 2 tablets (20 mEq total) by mouth daily. 90 tablet 0   rosuvastatin (CRESTOR) 20 MG tablet Take 1 tablet (20 mg total) by mouth daily. 90 tablet 3   TRELEGY ELLIPTA 100-62.5-25 MCG/ACT AEPB TAKE 1 PUFF BY MOUTH EVERY DAY (Patient taking differently: Take 1 puff by mouth daily.) 60 each 2   vitamin B-12 (CYANOCOBALAMIN) 500 MCG tablet Take 500 mcg by mouth daily.     No current facility-administered medications on file prior to visit.    The following portions of the patient's history were reviewed and updated as appropriate: allergies, current medications, past family history, past medical history, past social history, past surgical history and problem list.   ROS Otherwise as in subjective above   Objective: BP 120/60   Pulse 85   Resp 18   Wt 148 lb 12.8 oz (67.5 kg)   SpO2 94% Comment: with o2  BMI 26.36 kg/m   General appearance: alert, no distress, well developed, well nourished Nasal cannula in place on 4 L/min HEENT: normocephalic, sclerae anicteric, conjunctiva pink and moist, TMs pearly, nares patent, no discharge or erythema, pharynx normal Oral cavity: Somewhat dry MM, no lesions Neck: supple, no lymphadenopathy, no thyromegaly, no masses Heart: RRR, normal S1, S2, no murmurs Lungs: Decreased breath sounds in general, no wheezes, rhonchi, or rales Pulses: 2+ radial pulses, 1+ pedal pulses, normal cap refill Ext: Slight 1+ pitting edema in  lower legs bilaterally    Assessment: Encounter Diagnoses  Name Primary?   Chronic diastolic congestive heart failure (HCC) Yes   Vasovagal episode    Type 2 diabetes mellitus with hyperglycemia, unspecified whether long term insulin use (HCC)    SOB (shortness of breath)    Change in bowel function    Nose dryness    Chronic respiratory failure with hypoxia (HCC)      Plan: Diabetes-updated hemoglobin A1c lab today.  All readings are normal.  Continue current medications.  Recent vasovagal episode after doing a laxative.  No additional symptoms since the hospitalization.  She has had no more problems with bowel since the hospitalization.  Overall she seems to be  dry in her mucous membranes.  Discussed the need to increase water intake.  However we did discuss the balance between good hydration and not getting over volume in terms of heart disease.  We discussed the need for daily weights.  Updated labs as below.  Chronic hypoxia, shortness of breath-continue follow-up with cardiology and pulmonology.  Continue oxygen.  I gave her samples of [place] nasal saline gel to help with lubricating the nostrils with her use of nasal cannula    Anesha was seen today for diabetes .  Diagnoses and all orders for this visit:  Chronic diastolic congestive heart failure (HCC) -     Basic metabolic panel -     Brain natriuretic peptide  Vasovagal episode  Type 2 diabetes mellitus with hyperglycemia, unspecified whether long term insulin use (HCC) -     Hemoglobin A1c  SOB (shortness of breath)  Change in bowel function  Nose dryness  Chronic respiratory failure with hypoxia ([place])    Follow up: pending labs

## 2021-10-11 ENCOUNTER — Other Ambulatory Visit: Payer: Self-pay | Admitting: Medical

## 2021-10-11 LAB — BASIC METABOLIC PANEL
BUN/Creatinine Ratio: 15 (ref 12–28)
BUN: 22 mg/dL (ref 8–27)
CO2: 20 mmol/L (ref 20–29)
Calcium: 9.7 mg/dL (ref 8.7–10.3)
Chloride: 100 mmol/L (ref 96–106)
Creatinine, Ser: 1.46 mg/dL — ABNORMAL HIGH (ref 0.57–1.00)
Glucose: 125 mg/dL — ABNORMAL HIGH (ref 70–99)
Potassium: 4.4 mmol/L (ref 3.5–5.2)
Sodium: 138 mmol/L (ref 134–144)
eGFR: 36 mL/min/{1.73_m2} — ABNORMAL LOW (ref >59)

## 2021-10-11 LAB — HEMOGLOBIN A1C
Est. average glucose Bld gHb Est-mCnc: 154 mg/dL
Hgb A1c MFr Bld: 7 % — ABNORMAL HIGH (ref 4.8–5.6)

## 2021-10-11 LAB — BRAIN NATRIURETIC PEPTIDE: BNP: 833.9 pg/mL — ABNORMAL HIGH (ref 0.0–100.0)

## 2021-10-12 ENCOUNTER — Telehealth: Payer: Self-pay | Admitting: Medical

## 2021-10-12 NOTE — Telephone Encounter (Signed)
Call from [place], [place] [phone]  Requesting authorization for OT 2 x week for 2 weeks

## 2021-10-12 NOTE — Telephone Encounter (Signed)
Called and spoke to will and gave him ok verbal orders per shane

## 2021-10-14 ENCOUNTER — Telehealth: Payer: Self-pay | Admitting: Internal Medicine

## 2021-10-14 DIAGNOSIS — E1165 Type 2 diabetes mellitus with hyperglycemia: Secondary | ICD-10-CM

## 2021-10-14 MED ORDER — DAPAGLIFLOZIN PROPANEDIOL 5 MG PO TABS: 5.0000 mg | ORAL_TABLET | Freq: Every day | ORAL | 1 refills | Status: DC

## 2021-10-14 MED ORDER — FUROSEMIDE 20 MG PO TABS: 60.0000 mg | ORAL_TABLET | Freq: Every day | ORAL | 0 refills | Status: DC

## 2021-10-14 MED ORDER — POTASSIUM CHLORIDE ER 10 MEQ PO TBCR: 20.0000 meq | EXTENDED_RELEASE_TABLET | Freq: Every day | ORAL | 0 refills | Status: DC

## 2021-10-14 NOTE — Telephone Encounter (Signed)
Pt would like a refill on her potassium and lasix and farixga. Is this ok to refill?

## 2021-10-14 NOTE — Telephone Encounter (Signed)
done

## 2021-10-17 DIAGNOSIS — J439 Emphysema, unspecified: Secondary | ICD-10-CM | POA: Diagnosis not present

## 2021-11-02 ENCOUNTER — Ambulatory Visit: Payer: No Typology Code available for payment source

## 2021-11-03 DIAGNOSIS — J9611 Chronic respiratory failure with hypoxia: Secondary | ICD-10-CM | POA: Diagnosis not present

## 2021-11-03 DIAGNOSIS — Z794 Long term (current) use of insulin: Secondary | ICD-10-CM | POA: Diagnosis not present

## 2021-11-03 DIAGNOSIS — I5033 Acute on chronic diastolic (congestive) heart failure: Secondary | ICD-10-CM | POA: Diagnosis not present

## 2021-11-03 DIAGNOSIS — I2510 Atherosclerotic heart disease of native coronary artery without angina pectoris: Secondary | ICD-10-CM | POA: Diagnosis not present

## 2021-11-03 DIAGNOSIS — I50812 Chronic right heart failure: Secondary | ICD-10-CM | POA: Diagnosis not present

## 2021-11-03 DIAGNOSIS — E119 Type 2 diabetes mellitus without complications: Secondary | ICD-10-CM | POA: Diagnosis not present

## 2021-11-03 DIAGNOSIS — R55 Syncope and collapse: Secondary | ICD-10-CM | POA: Diagnosis not present

## 2021-11-03 DIAGNOSIS — Z7984 Long term (current) use of oral hypoglycemic drugs: Secondary | ICD-10-CM | POA: Diagnosis not present

## 2021-11-03 DIAGNOSIS — I110 Hypertensive heart disease with heart failure: Secondary | ICD-10-CM | POA: Diagnosis not present

## 2021-11-03 DIAGNOSIS — I272 Pulmonary hypertension, unspecified: Secondary | ICD-10-CM | POA: Diagnosis not present

## 2021-11-09 ENCOUNTER — Other Ambulatory Visit: Payer: Self-pay | Admitting: Medical

## 2021-11-09 ENCOUNTER — Encounter: Payer: Self-pay | Admitting: Internal Medicine

## 2021-11-09 ENCOUNTER — Ambulatory Visit: Payer: No Typology Code available for payment source | Attending: Internal Medicine | Admitting: Internal Medicine

## 2021-11-09 VITALS — BP 130/80 | HR 81 | Ht 63.0 in | Wt 154.2 lb

## 2021-11-09 DIAGNOSIS — Z951 Presence of aortocoronary bypass graft: Secondary | ICD-10-CM

## 2021-11-09 DIAGNOSIS — I5032 Chronic diastolic (congestive) heart failure: Secondary | ICD-10-CM | POA: Diagnosis not present

## 2021-11-09 DIAGNOSIS — I272 Pulmonary hypertension, unspecified: Secondary | ICD-10-CM | POA: Diagnosis not present

## 2021-11-09 DIAGNOSIS — I2510 Atherosclerotic heart disease of native coronary artery without angina pectoris: Secondary | ICD-10-CM | POA: Diagnosis not present

## 2021-11-09 DIAGNOSIS — J439 Emphysema, unspecified: Secondary | ICD-10-CM | POA: Diagnosis not present

## 2021-11-09 DIAGNOSIS — R0600 Dyspnea, unspecified: Secondary | ICD-10-CM

## 2021-11-09 DIAGNOSIS — I10 Essential (primary) hypertension: Secondary | ICD-10-CM

## 2021-11-09 DIAGNOSIS — E1165 Type 2 diabetes mellitus with hyperglycemia: Secondary | ICD-10-CM

## 2021-11-09 DIAGNOSIS — E785 Hyperlipidemia, unspecified: Secondary | ICD-10-CM | POA: Diagnosis not present

## 2021-11-09 DIAGNOSIS — Z794 Long term (current) use of insulin: Secondary | ICD-10-CM

## 2021-11-09 DIAGNOSIS — E1169 Type 2 diabetes mellitus with other specified complication: Secondary | ICD-10-CM

## 2021-11-09 MED ORDER — FUROSEMIDE 20 MG PO TABS: 60.0000 mg | ORAL_TABLET | Freq: Every day | ORAL | 3 refills | Status: DC

## 2021-11-09 NOTE — Progress Notes (Signed)
Cardiology Office Note:    Date:  11/09/2021   ID:  Jessica Hart, DOB 12-23-1940, MRN [id number]  PCP:  Carlena Hurl, PA-C  Cardiologist:  Elouise Munroe, MD  Electrophysiologist:  None   Referring MD: Carlena Hurl, PA-C   Chief Complaint/Reason for Referral: CAD s/p CABG, new dyspnea/hypoxic respiratory failure  History of Present Illness:    Jessica Hart is a 81 y.o. female with a history of diabetes mellitus type 2, hypertension, hyperlipidemia, GERD, remote tobacco abuse who presents for follow-up after CABG x3 LIMA to LAD, reverse saphenous vein graft to PDA and OM1.  She was initially referred for coronary angiography given progressive chest pain and infarct seen on stress test inferior and inferolateral with nuclear stress EF of 36%. Coronary angiography showed severe three-vessel coronary artery obstructive disease with severe three-vessel calcification. Coronary artery bypass grafting performed on 09/02/2019.  She reports shortness of breath even without exertion. Small amounts of activity cause her oxygen levels to fall. She feels very fatigued when this happens. She has been dx with PHTN and uses oxygen therapy now.   She recently was hospitalized for a presyncopal episode. She was taking a stool softener which worked too well, and became dehydrated after experiencing diarrhea.   She complains of edema in her feet. She also has fluid in her neck which "pulses." We reviewed echo findings of PHTN and RH dysfunction.   She is consistent with her oxygen. She has not missed a night, and does not leave the house without it. She is compliant with her lasix.  She denies any palpitations or chest pain. No lightheadedness, headaches,orthopnea, or PND.     Past Medical History:  Diagnosis Date   Arthritis    Cataract bilaterl 3 years ago   Coronary artery disease    Diabetes mellitus 02/27/2005   Diabetes mellitus type 2, controlled, without complications  ([place]) 08/22/6387   Qualifier: Diagnosis of  By: Drucie Ip     Diverticulosis    Dyspnea    Echocardiogram findings abnormal, without diagnosis 2005   EF 47%, no ischemia, no infarction   Former smoker 01/10/2017   45 pack year history, quit in 2007    GERD (gastroesophageal reflux disease)    Heart murmur    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   History of bone density study 2006   Lt hip = -1.0, L spine = -1.8    Hyperlipidemia    Hypertension     Past Surgical History:  Procedure Laterality Date   ABDOMINAL HYSTERECTOMY  1989   one ovary remains per patient   CARDIAC CATHETERIZATION  08/20/2019   COLONOSCOPY     CORONARY ARTERY BYPASS GRAFT N/A 09/02/2019   Procedure: CORONARY ARTERY BYPASS GRAFTING (CABG) TIMES THREE USING LEFT INTERNAL MAMMARY ARTERY AND RIGHT GREATER SAPHENOUS VEIN;  Surgeon: Lajuana Matte, MD;  Location: [facility];  Service: Open Heart Surgery;  Laterality: N/A;   ENDOVEIN HARVEST OF GREATER SAPHENOUS VEIN Right 09/02/2019   Procedure: ENDOVEIN HARVEST OF GREATER SAPHENOUS VEIN;  Surgeon: Lajuana Matte, MD;  Location: [facility];  Service: Open Heart Surgery;  Laterality: Right;   EYE SURGERY Bilateral    CATARACT   LEFT HEART CATH AND CORONARY ANGIOGRAPHY N/A 08/20/2019   Procedure: LEFT HEART CATH AND CORONARY ANGIOGRAPHY;  Surgeon: Belva Crome, MD;  Location: [facility];  Service: Cardiovascular;  Laterality: N/A;   RIGHT AND LEFT HEART CATH N/A 07/13/2021   Procedure: RIGHT AND  LEFT HEART CATH;  Surgeon: Early Osmond, MD;  Location: [facility];  Service: Cardiovascular;  Laterality: N/A;   TEE WITHOUT CARDIOVERSION N/A 09/02/2019   Procedure: TRANSESOPHAGEAL ECHOCARDIOGRAM (TEE);  Surgeon: Lajuana Matte, MD;  Location: [facility];  Service: Open Heart Surgery;  Laterality: N/A;   TONSILECTOMY, ADENOIDECTOMY, BILATERAL MYRINGOTOMY AND TUBES  1965   TONSILLECTOMY      Current Medications: Current Meds  Medication Sig   acetaminophen  (TYLENOL) 500 MG tablet Take 2 tablets (1,000 mg total) by mouth every 8 (eight) hours as needed.   aspirin EC 81 MG tablet Take 1 tablet (81 mg total) by mouth daily. Swallow whole.   Cholecalciferol (D3 5000) 125 MCG (5000 UT) capsule Take 5,000 Units by mouth daily.   CREON 36000-114000 units CPEP capsule TAKE 2 CAPSULES BY MOUTH 3 TIMES A DAY WITH MEALS AND TAKE 1 CAPSULE WITH EACH SNACK TWICE DAILY (Patient taking differently: Take 36,000 Units by mouth 3 (three) times daily with meals.)   dapagliflozin propanediol (FARXIGA) 5 MG TABS tablet Take 1 tablet (5 mg total) by mouth daily.   insulin degludec (TRESIBA FLEXTOUCH) 100 UNIT/ML FlexTouch Pen Inject 15 Units into the skin daily after supper.   losartan (COZAAR) 50 MG tablet TAKE 1 TABLET BY MOUTH EVERY DAY (Patient taking differently: Take 50 mg by mouth daily.)   metFORMIN (GLUCOPHAGE-XR) 750 MG 24 hr tablet TAKE 1 TABLET BY MOUTH EVERY DAY WITH BREAKFAST   metoprolol tartrate (LOPRESSOR) 25 MG tablet Take 25 mg by mouth 2 (two) times daily.   Misc. Devices MISC SIG: 1 L of Oxygen via Nasal cannula around the clock Dispense Home Oxygen and Portable tank and necessary supplies. Dx: Hypoxemia, Shortness of breath (Patient taking differently: Place 4-4.5 L into the nose See admin instructions. 4 - 4.5 L of Oxygen via Nasal cannula around the clock Dispense Home Oxygen and Portable tank and necessary supplies. Dx: Hypoxemia, Shortness of breath)   Multiple Vitamins-Minerals (NO IRON MULT VITAMIN-MINERALS) TABS TAKE 1 TABLET BY MOUTH DAILY (Patient taking differently: Take 1 tablet by mouth daily.)   omeprazole (PRILOSEC) 40 MG capsule TAKE 1 CAPSULE BY MOUTH EVERY DAY (Patient taking differently: Take 40 mg by mouth daily.)   potassium chloride (KLOR-CON) 10 MEQ tablet Take 2 tablets (20 mEq total) by mouth daily.   rosuvastatin (CRESTOR) 20 MG tablet Take 1 tablet (20 mg total) by mouth daily.   TRELEGY ELLIPTA 100-62.5-25 MCG/ACT AEPB TAKE 1  PUFF BY MOUTH EVERY DAY (Patient taking differently: Take 1 puff by mouth daily.)   vitamin B-12 (CYANOCOBALAMIN) 500 MCG tablet Take 500 mcg by mouth daily.   [DISCONTINUED] furosemide (LASIX) 20 MG tablet Take 3 tablets (60 mg total) by mouth daily.     Allergies:   Lisinopril   Social History   Tobacco Use   Smoking status: Former    Packs/day: 1.00    Years: 45.00    Total pack years: 45.00    Types: Cigarettes    Quit date: 07/27/2005    Years since quitting: 16.3   Smokeless tobacco: Former    Quit date: 03/2005  Vaping Use   Vaping Use: Never used  Substance Use Topics   Alcohol use: Not Currently    Comment: beer occasionally    Drug use: No     Family History: The patient's family history includes Cancer in her sister; Heart disease in her father; Tongue cancer in her sister. There is no history of  Colon polyps, Rectal cancer, or Stomach cancer.  ROS:   Please see the history of present illness.     (+) Shortness of Breath (+) Fatigue (+) Edema  All other systems reviewed and are negative.  EKGs/Labs/Other Studies Reviewed:    The following studies were reviewed today:  Upper Venous Doppler 07/14/21:  Summary:     Right:  There is no DVT noted in the right IJV or subclavian vein. 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 Unable to locate the neck of the pseudoaneurysm secondary to breathing and movement.     Left:  No DVT noted in the IJV or the subclavian vein.   Right and Left Heart Cath 07/13/21:  Conclusion:  Right heart catheterization with pulmonary arterial hypertension with a mean pulmonary artery pressure of 44 mmHg and a pulmonary vascular resistance of approximately 10 Wood units.   Chest CT 05/19/21 1. 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 Outpatient referral to Pulmonology for further clinical evaluation is recommended. 2. Mild dilatation of the pulmonic trunk (3.4 cm in diameter), concerning for associated pulmonary arterial hypertension. 3. Aortic atherosclerosis, in addition to left main and three-vessel coronary artery disease. Status post median sternotomy for CABG including LIMA to the LAD. 4. Cardiomegaly with right atrial and right ventricular dilatation. 5. Mild paraseptal emphysema.   Aortic Atherosclerosis (ICD10-I70.0) and Emphysema (ICD10-J43.9).  Echo 03/08/21  1. Left ventricular ejection fraction, by estimation, is 65 to 70%. The left ventricle has normal function. The left ventricle has no regional wall motion abnormalities. There is moderate left ventricular hypertrophy.  Left ventricular diastolic parameters are consistent with Grade I diastolic dysfunction (impaired relaxation). Elevated left atrial pressure. There is the interventricular septum is flattened in systole and diastole, consistent with right  ventricular pressure and volume overload. The average left ventricular global longitudinal strain is -18.2 %. The global longitudinal strain is normal.   2. Right ventricular systolic function is mildly reduced. The right ventricular size is moderately enlarged. There is severely elevated pulmonary artery systolic pressure. The estimated right ventricular systolic pressure is 20.9 mmHg.   3. Right atrial size was mildly dilated.   4. The mitral valve is normal in structure. Trivial mitral valve  regurgitation. No evidence of mitral stenosis. Moderate mitral annular calcification.   5. Tricuspid valve regurgitation is moderate.   6. The aortic valve is calcified. There is moderate calcification of the aortic valve. There is severe thickening of the aortic valve. Aortic valve regurgitation is not visualized. Aortic valve sclerosis/calcification is present, without any  evidence of  aortic stenosis.   7. The inferior vena cava is normal in size with greater than 50% respiratory variability, suggesting right atrial pressure of 3 mmHg.   EKG:  EKG is personally reviewed  11/09/21: No EKG ordered 05/24/21 - NSR, inferior and anterior infarct pattern. 11/04/20 - NSR, inferior and anterior infarct pattern. 05/04/20 - NSR, inferior and anterior infarct pattern.   Recent Labs: 02/08/2021: TSH 1.670 07/13/2021: ALT 21 08/10/2021: Pro B Natriuretic peptide (BNP) 1,314.0 09/29/2021: Hemoglobin 15.3; Magnesium 2.2; Platelets 210 10/10/2021: BNP 833.9; BUN 22; Creatinine, Ser 1.46; Potassium 4.4; Sodium 138  Recent Lipid Panel    Component Value Date/Time   CHOL 129 05/11/2020 0850   TRIG 133 05/11/2020 0850   HDL  40 05/11/2020 0850   CHOLHDL 3.2 05/11/2020 0850   CHOLHDL 4.4 08/29/2016 0920   VLDL 28 08/29/2016 0920   LDLCALC 65 05/11/2020 0850   LDLDIRECT 81 03/21/2013 1431    Physical Exam:    VS:  BP 130/80   Pulse 81   Ht _0  (1.6 m)   Wt 154 lb 3.2 oz (69.9 kg)   SpO2 91%   BMI 27.32 kg/m     Wt Readings from Last 5 Encounters:  11/09/21 154 lb 3.2 oz (69.9 kg)  10/10/21 148 lb 12.8 oz (67.5 kg)  09/30/21 145 lb 14.4 oz (66.2 kg)  09/28/21 151 lb (68.5 kg)  09/21/21 149 lb 3.2 oz (67.7 kg)    Constitutional: No acute distress Eyes: sclera non-icteric, normal conjunctiva and lids ENMT: normal dentition, moist mucous membranes Cardiovascular: regular rhythm, normal rate, 2/6 systolic murmur. JVD is at the lower 1/3 of the neck, sitting upright.  Respiratory: clear to auscultation bilaterally, using supplemental oxygen GI : normal bowel sounds, soft and nontender. No distention.   MSK: extremities warm, well perfused. 1+ edema in the lower extremities  NEURO: grossly nonfocal exam, moves all extremities. PSYCH: alert and oriented x 3, normal mood and affect.   ASSESSMENT:    1. Pulmonary HTN ([place])   2. Pulmonary emphysema, unspecified  emphysema type ([place])   3. Dyspnea, unspecified type   4. Coronary artery disease involving native heart without angina pectoris, unspecified vessel or lesion type   5. S/P CABG x 3   6. Chronic diastolic congestive heart failure ([place])   7. Essential hypertension   8. Hyperlipidemia associated with type 2 diabetes mellitus ([place])   9. Type 2 diabetes mellitus with hyperglycemia, with long-term current use of insulin ([facility])     PLAN:    PHTN Emphysema Dyspnea on exertion Hypoxia  - NYHA II-III today.  -likely driven by emphysema and group 3 PHTN. Diuresing with lasix 60 mg daily. Continue to follow with pulmonary for chronic hypoxemic respiratory failure.  - no osa on sleep study. Hypoxia noted.  - Right heart catheterization with pulmonary arterial hypertension with a mean pulmonary artery pressure of 44 mmHg and a pulmonary vascular resistance of approximately 10 Wood units. - seen by Dr. Haroldine Laws who offered patient low dose PDE5i with possible Tyvaso if tolerated. Patient defers due to concern for worsening of symptoms. I offered that we could trial it and she will think about it. - she requires further diuresis today. Will increase to 80 mg daily for 3 days and contact us if swelling persists by Friday.   Coronary artery disease involving native heart without angina pectoris, unspecified vessel or lesion type S/P CABG x 3 - continue ASA 81 mg daily - continue metoprolol tartrate 25 mg BID - continue rosuvastatin 20 mg daily.  - no chest pain   Essential hypertension - Plan: EKG 12-Lead - BP well controlled.  - continue losartan 50 mg daily - continue metoprolol tartrate 25 mg BID - lasix as above.  Hyperlipidemia associated with type 2 diabetes mellitus ([place])  - continue crestor 20 mg daily. Offered [id number] previously and she defered.  Type 2 diabetes mellitus with hyperglycemia, with long-term current use of insulin ([facility]) - she is on Farxiga, insulin, metformin per  PCP  Total time of encounter: 30 minutes total time of encounter, including 20 minutes spent in face-to-face patient care on the date of this encounter. This time includes coordination of care and counseling regarding above mentioned  problem list. Remainder of non-face-to-face time involved reviewing chart documents/testing relevant to the patient encounter and documentation in the medical record. I have independently reviewed documentation from referring provider.   Cherlynn Kaiser, MD, [facility]   Medication Adjustments/Labs and Tests Ordered: Current medicines are reviewed at length with the patient today.  Concerns regarding medicines are outlined above.   No orders of the defined types were placed in this encounter.    Meds ordered this encounter  Medications   furosemide (LASIX) 20 MG tablet    Sig: Take 3 tablets (60 mg total) by mouth daily.    Dispense:  90 tablet    Refill:  3     Patient Instructions  Medication Instructions:   TAKE ONE MORE TABLET OF LASIX (71m ) TONIGHT WHEN YOU GET HOME   TAKE 4 TABLETS OF LASIX TOMORROW 9/14 FOR A  TOTAL OF (834m  THEN RESUME NORMAL DOSING FRIDAY 9/15  PLEASE CALL (JENNA RN) OR NURSE TRIAGE [phone] IF YOU ARE STILL HAVING ISSUES ON FRIDAY  *If you need a refill on your cardiac medications before your next appointment, please call your pharmacy*  Lab Work: None Ordered At This Time.  If you have labs (blood work) drawn today and your tests are completely normal, you will receive your results only by: My[place] you have MyChart) OR A paper copy in the mail If you have any lab test that is abnormal or we need to change your treatment, we will call you to review the results.  Testing/Procedures: None Ordered At This Time.   Follow-Up: [facility].  As part of our continuing mission to provide you with exceptional heart care,  we have created designated Provider Care Teams.  These Care Teams include your primary Cardiologist (physician) and Advanced Practice Providers (APPs -  Physician Assistants and Nurse Practitioners) who all work together to provide you with the care you need, when you need it.  Your next appointment:   5 month(s)  The format for your next appointment:   In Person  Provider:   GaElouise MunroeMD             I,Jessica Hart,acting as a scribe for GaElouise MunroeMD.,have documented all relevant documentation on the behalf of GaElouise MunroeMD,as directed by  GaElouise MunroeMD while in the presence of GaElouise MunroeMD.   I, GaElouise MunroeMD, have reviewed all documentation for the visit on 11/09/2021. The documentation on today's date of service for the exam, diagnosis, procedures, and orders are all accurate and complete.

## 2021-11-09 NOTE — Patient Instructions (Signed)
Medication Instructions:   TAKE ONE MORE TABLET OF LASIX (42m ) TONIGHT WHEN YOU GET HOME   TAKE 4 TABLETS OF LASIX TOMORROW 9/14 FOR A  TOTAL OF (844m  THEN RESUME NORMAL DOSING FRIDAY 9/15  PLEASE CALL (Gurman Ashland RN) OR NURSE TRIAGE [phone] IF YOU ARE STILL HAVING ISSUES ON FRIDAY  *If you need a refill on your cardiac medications before your next appointment, please call your pharmacy*  Lab Work: None Ordered At This Time.  If you have labs (blood work) drawn today and your tests are completely normal, you will receive your results only by: [place] you have MyChart) OR A paper copy in the mail If you have any lab test that is abnormal or we need to change your treatment, we will call you to review the results.  Testing/Procedures: None Ordered At This Time.   Follow-Up: [facility].  As part of our continuing mission to provide you with exceptional heart care, we have created designated Provider Care Teams.  These Care Teams include your primary Cardiologist (physician) and Advanced Practice Providers (APPs -  Physician Assistants and Nurse Practitioners) who all work together to provide you with the care you need, when you need it.  Your next appointment:   5 month(s)  The format for your next appointment:   In Person  Provider:   GaElouise MunroeMD

## 2021-11-15 NOTE — Telephone Encounter (Signed)
OV recall set for 6 months for reassessment

## 2021-11-17 DIAGNOSIS — J439 Emphysema, unspecified: Secondary | ICD-10-CM | POA: Diagnosis not present

## 2021-11-21 ENCOUNTER — Telehealth: Payer: Self-pay

## 2021-11-21 NOTE — Telephone Encounter (Signed)
Attempted to call patient to see how swelling is doing per recent visit with Dr. Margaretann Loveless on 9/13- unable to reach. Left message for her to call back.

## 2021-11-22 NOTE — Telephone Encounter (Signed)
Attempted to call patient, left message for patient to call back to office.   

## 2021-11-22 NOTE — Telephone Encounter (Addendum)
Patient stated the swelling in her feet has decreased but still present. When she elevates her legs and has her husband do PROM, the swelling goes down. When she walks around the swelling returns. Education on doing ankle pumps while sitting. Overall, the swelling has decreased some since last visit with Dr. Margaretann Loveless.

## 2021-11-22 NOTE — Telephone Encounter (Signed)
Patient is returning call to RN.

## 2021-11-24 ENCOUNTER — Telehealth: Payer: Self-pay

## 2021-11-24 ENCOUNTER — Other Ambulatory Visit: Payer: Self-pay | Admitting: Medical

## 2021-11-24 DIAGNOSIS — I10 Essential (primary) hypertension: Secondary | ICD-10-CM

## 2021-11-24 NOTE — Telephone Encounter (Signed)
Spoke with patient regarding follow up CT lung nodule is due.  Patient states she cannot schedule today but will call us to schedule in about two weeks.  Will send patient a letter today with reminder to call.

## 2021-12-05 NOTE — Telephone Encounter (Signed)
  Returned call to patient and made her aware of the following:  Jessica Munroe, MD     Recommend compression socks if possible.   Also recommend using the additional lasix 20 mg tab if needed for swelling, in addition to her regular 60 mg prescription.     Patient states that she has enough lasix at the moment and will call if refill needed, patient also states she is doing well and working on more movement and activity around her house. Advised patient to call back to office with any issues, questions, or concerns. Patient verbalized understanding.

## 2021-12-17 DIAGNOSIS — J439 Emphysema, unspecified: Secondary | ICD-10-CM | POA: Diagnosis not present

## 2021-12-21 ENCOUNTER — Ambulatory Visit (INDEPENDENT_AMBULATORY_CARE_PROVIDER_SITE_OTHER): Payer: No Typology Code available for payment source | Admitting: Pulmonary Disease

## 2021-12-21 ENCOUNTER — Encounter: Payer: Self-pay | Admitting: Pulmonary Disease

## 2021-12-21 VITALS — BP 124/68 | HR 74 | Wt 154.0 lb

## 2021-12-21 DIAGNOSIS — I270 Primary pulmonary hypertension: Secondary | ICD-10-CM

## 2021-12-21 DIAGNOSIS — J9611 Chronic respiratory failure with hypoxia: Secondary | ICD-10-CM | POA: Diagnosis not present

## 2021-12-21 MED ORDER — TADALAFIL (PAH) 20 MG PO TABS: ORAL_TABLET | ORAL | 0 refills | Status: DC

## 2021-12-21 NOTE — Patient Instructions (Addendum)
Nice to see you again  Fr the pulmonary hypertension, lets start a pill once a day.  Take tadalafil 20 mg once a day for 30 days then increase to 40 mg (2 tablets) once a day thereafter.  At this helps her get deeper breaths, breathing easier, helped her shortness of breath.  Please stop the medicine and contact me immediately if you develop worsening swelling, shortness of breath, lower oxygen levels.  Please let me know if it is too expensive.  Return to clinic in 2 months or sooner as needed with Dr. Silas Flood

## 2021-12-22 ENCOUNTER — Telehealth: Payer: Self-pay | Admitting: Pulmonary Disease

## 2021-12-22 NOTE — Telephone Encounter (Signed)
Dx code for pt's tadalafil is I27.21.  Called [facility] and spoke with pharmacist and provided the Dx code. Nothing further needed.

## 2021-12-26 NOTE — Progress Notes (Signed)
_0  ID: Jessica Hart, female    DOB: 07-28-1940, 81 y.o.   MRN: [id number]  Chief Complaint  Patient presents with   Follow-up    Follow up for chronic resp failure. Pt is still on trelegy daily with no issues. She states she has had bad days and good days. Pt states she is having more swelling in her legs. More in left foot than right. Has been like this for about a month.     Referring provider: Carlena Hurl, PA-C  HPI:   81 y.o. woman whom we are seeing in follow up for volume overload and diagnosis of pulmonary HTN on RHC.  Patient presents for routine follow-up.  Her weight is up to 154 pounds.  Was one-point at last visit.  Recent cardiology note reviewed.  Increased her diuretics for 3 days.  She only did 1 day.  Did not use as recommended.  Her breathing seems stable to her.  She remains on oxygen.  She reports adherence to this.  In the past she had not.  We discussed at length pulmonary hypertension.  Discussed at length what she likely has pulmonary hypertension.  We discussed using medicines to treat this.  She states that she is not ready to try medicines now several months after first discussed.  We discussed at length the side effect profile.  The risk and benefits.  Potential adverse effects of pulm vasodilators if group 2 or left-sided heart disease were to be the main culprit.  She expressed understanding.  We will start with low-dose tadalafil and increase to 40 mg daily if tolerated after 30 days.  Discussed addition of additional medications in the future if needed.  HPI at initial visit: Was in usual state of health.  Was seen PCP and routine follow-up.  Noted to be hypoxemic work-up, rooming.  Noted little bit of cough for 3 days.  She was placed on 1 L of oxygen.  Came up to 92%.  She was placed on Trelegy.  She was referred to pulmonary.  Home oxygen was delivered.  She represented to PCP about 10 days later.  Noted to be at 87% on 1 L with exertion  although no changes were made to oxygen prescription.  She denies significant dyspnea, nothing too significant.  Able to do tasks he needs to do.  Reviewed multiple CT scans that shows mild emphysematous changes primarily in the upper lobes with interlobular septal thickening and bronchiectasis in bilateral lower lobes without clear honeycombing on my interpretation.  PMH: Hypertension, diabetes, hyperlipidemia, tobacco abuse in remission surgical history: Colon surgery, hysterectomy Family history: CAD in first relatives, no significant [name] is a 81 year old with Social history: Former smoker, 45-pack-year, quit 2008, retired, lives with husband Restaurant manager, fast food / Pulmonary Flowsheets:   ACT:      No data to display           MMRC:     No data to display           Epworth:      No data to display           Tests:   FENO:  No results found for: "NITRICOXIDE"  PFT:    Latest Ref Rng & Units 04/06/2021    3:35 PM  PFT Results  FVC-Pre L 1.74   FVC-Predicted Pre % 89   FVC-Post L 1.64   FVC-Predicted Post % 84   Pre FEV1/FVC % % 83  Post FEV1/FCV % % 84   FEV1-Pre L 1.44   FEV1-Predicted Pre % 96   FEV1-Post L 1.37   DLCO uncorrected ml/min/mmHg 6.61   DLCO UNC% % 36   DLVA Predicted % 55   TLC L 3.72   TLC % Predicted % 75   RV % Predicted % 60   Personally reviewed interpret spirometry suggestive of moderate to mild restriction versus gas trapping, no bronchodilator response, no fixed obstruction, TLC moderately reduced, DLCO severely reduced  WALK:      No data to display           Imaging: Personally reviewed and as per EMR discussion of this note No results found.  Lab Results: Personally reviewed, no anemia, no polycythemia CBC    Component Value Date/Time   WBC 8.2 09/29/2021 0427   RBC 5.41 (H) 09/29/2021 0427   HGB 15.3 (H) 09/29/2021 0427   HGB 14.5 02/08/2021 1205   HCT 46.3 (H) 09/29/2021 0427   HCT 45.6  02/08/2021 1205   PLT 210 09/29/2021 0427   PLT 286 02/08/2021 1205   MCV 85.6 09/29/2021 0427   MCV 85 02/08/2021 1205   MCH 28.3 09/29/2021 0427   MCHC 33.0 09/29/2021 0427   RDW 18.1 (H) 09/29/2021 0427   RDW 14.6 02/08/2021 1205   LYMPHSABS 1.4 09/28/2021 2133   LYMPHSABS 1.9 02/08/2021 1205   MONOABS 0.4 09/28/2021 2133   EOSABS 0.1 09/28/2021 2133   EOSABS 0.1 02/08/2021 1205   BASOSABS 0.1 09/28/2021 2133   BASOSABS 0.1 02/08/2021 1205    BMET    Component Value Date/Time   NA 138 10/10/2021 1321   K 4.4 10/10/2021 1321   CL 100 10/10/2021 1321   CO2 20 10/10/2021 1321   GLUCOSE 125 (H) 10/10/2021 1321   GLUCOSE 106 (H) 09/29/2021 0427   BUN 22 10/10/2021 1321   CREATININE 1.46 (H) 10/10/2021 1321   CREATININE 0.69 01/10/2017 1235   CALCIUM 9.7 10/10/2021 1321   GFRNONAA 51 (L) 09/29/2021 0427   GFRNONAA 86 01/10/2017 1235   GFRAA 97 02/02/2020 1108   GFRAA 99 01/10/2017 1235    BNP    Component Value Date/Time   BNP 833.9 (H) 10/10/2021 1321   BNP 1,007.8 (H) 09/28/2021 2133    ProBNP    Component Value Date/Time   PROBNP 1,314.0 (H) 08/10/2021 1600    Specialty Problems       Pulmonary Problems   SOB (shortness of breath)   Pulmonary emphysema (HCC)   Pulmonary nodules   Chronic respiratory failure with hypoxia (HCC)    Allergies  Allergen Reactions   Lisinopril Itching and Cough    Immunization History  Administered Date(s) Administered   Fluad Quad(high Dose 65+) 11/11/2018   Influenza Whole 12/11/2007   Influenza, High Dose Seasonal PF 02/29/2016, 01/10/2017, 11/15/2017   Influenza,inj,Quad PF,6+ Mos 10/29/2012, 04/06/2014, 02/19/2015   PFIZER(Purple Top)SARS-COV-2 Vaccination 04/22/2019, 05/13/2019, 01/30/2020   Pneumococcal Conjugate-13 04/06/2014   Pneumococcal Polysaccharide-23 12/11/2007   Td 05/28/2004    Past Medical History:  Diagnosis Date   Arthritis    Cataract bilaterl 3 years ago   Coronary artery disease     Diabetes mellitus 02/27/2005   Diabetes mellitus type 2, controlled, without complications ([place]) 1/61/0960   Qualifier: Diagnosis of  By: Drucie Ip     Diverticulosis    Dyspnea    Echocardiogram findings abnormal, without diagnosis 2005   EF 47%, no ischemia, no infarction   Former smoker 01/10/2017  45 pack year history, quit in 2007    GERD (gastroesophageal reflux disease)    Heart murmur    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   History of bone density study 2006   Lt hip = -1.0, L spine = -1.8    Hyperlipidemia    Hypertension     Tobacco History: Social History   Tobacco Use  Smoking Status Former   Packs/day: 1.00   Years: 45.00   Total pack years: 45.00   Types: Cigarettes   Quit date: 07/27/2005   Years since quitting: 16.4  Smokeless Tobacco Former   Quit date: 03/2005   Counseling given: Not Answered   Continue to not smoke  Outpatient Encounter Medications as of 12/21/2021  Medication Sig   acetaminophen (TYLENOL) 500 MG tablet Take 2 tablets (1,000 mg total) by mouth every 8 (eight) hours as needed.   aspirin EC 81 MG tablet Take 1 tablet (81 mg total) by mouth daily. Swallow whole.   Cholecalciferol (D3 5000) 125 MCG (5000 UT) capsule Take 5,000 Units by mouth daily.   CREON 36000-114000 units CPEP capsule TAKE 2 CAPSULES BY MOUTH 3 TIMES A DAY WITH MEALS AND TAKE 1 CAPSULE WITH EACH SNACK TWICE DAILY (Patient taking differently: Take 36,000 Units by mouth 3 (three) times daily with meals.)   dapagliflozin propanediol (FARXIGA) 5 MG TABS tablet Take 1 tablet (5 mg total) by mouth daily.   furosemide (LASIX) 20 MG tablet Take 3 tablets (60 mg total) by mouth daily.   insulin degludec (TRESIBA FLEXTOUCH) 100 UNIT/ML FlexTouch Pen Inject 15 Units into the skin daily after supper.   losartan (COZAAR) 50 MG tablet TAKE 1 TABLET BY MOUTH EVERY DAY   metFORMIN (GLUCOPHAGE-XR) 750 MG 24 hr tablet TAKE 1 TABLET BY MOUTH EVERY DAY WITH BREAKFAST   metoprolol tartrate  (LOPRESSOR) 25 MG tablet Take 25 mg by mouth 2 (two) times daily.   Misc. Devices MISC SIG: 1 L of Oxygen via Nasal cannula around the clock Dispense Home Oxygen and Portable tank and necessary supplies. Dx: Hypoxemia, Shortness of breath (Patient taking differently: Place 4-4.5 L into the nose See admin instructions. 4 - 4.5 L of Oxygen via Nasal cannula around the clock Dispense Home Oxygen and Portable tank and necessary supplies. Dx: Hypoxemia, Shortness of breath)   Multiple Vitamins-Minerals (NO IRON MULT VITAMIN-MINERALS) TABS TAKE 1 TABLET BY MOUTH DAILY (Patient taking differently: Take 1 tablet by mouth daily.)   omeprazole (PRILOSEC) 40 MG capsule TAKE 1 CAPSULE BY MOUTH EVERY DAY (Patient taking differently: Take 40 mg by mouth daily.)   potassium chloride (KLOR-CON) 10 MEQ tablet TAKE 2 TABLETS BY MOUTH DAILY   rosuvastatin (CRESTOR) 20 MG tablet Take 1 tablet (20 mg total) by mouth daily.   tadalafil, PAH, (ADCIRCA) 20 MG tablet Take 1 tablet (20 mg total) by mouth daily for 30 days, THEN 2 tablets (40 mg total) daily.   TRELEGY ELLIPTA 100-62.5-25 MCG/ACT AEPB TAKE 1 PUFF BY MOUTH EVERY DAY (Patient taking differently: Take 1 puff by mouth daily.)   vitamin B-12 (CYANOCOBALAMIN) 500 MCG tablet Take 500 mcg by mouth daily.   No facility-administered encounter medications on file as of 12/21/2021.     Review of Systems  Review of Systems  N/a Physical Exam  BP 124/68 (BP Location: Right Arm, Patient Position: Sitting, Cuff Size: Normal)   Pulse 74   Wt 154 lb (69.9 kg)   SpO2 93%   BMI 27.28 kg/m   Wt Readings from Last  5 Encounters:  12/21/21 154 lb (69.9 kg)  11/09/21 154 lb 3.2 oz (69.9 kg)  10/10/21 148 lb 12.8 oz (67.5 kg)  09/30/21 145 lb 14.4 oz (66.2 kg)  09/28/21 151 lb (68.5 kg)    BMI Readings from Last 5 Encounters:  12/21/21 27.28 kg/m  11/09/21 27.32 kg/m  10/10/21 26.36 kg/m  09/30/21 25.85 kg/m  09/28/21 26.75 kg/m     Physical  Exam General: Well-appearing, no acute distress Eyes: EOMI, no icterus Pulmonary: Clear, normal breathing, no crackles Cardiovascular: Regular rate and rhythm, no murmurs MSK: No synovitis, no joint effusion Neuro: Normal gait, no weakness Psych: Normal mood, full affect   Assessment & Plan:   Chronic hypoxemic respiratory failure: Suspect related to emphysema seen on CT scan.  Possible basilar fibrosis could not appear classic for UIP but no other clear pattern.  Possibly due to recurrent aspiration in setting of long history of GERD.  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  Also suspect contribution of VQ mismatch due to recently discovered likely pulmonary hypertension on TTE.  It was reiterated again how she should be using oxygen.  She is to use all times during the day at rest and with exertion.  Stressed keeping oxygen saturation above 90%, ideally greater than 92%.  Continue nocturnal oxygen, 4 L.  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  She has poor insight into this. Consider antifibrotic's but she has declined other medications to treat pulmonary issues.  Can reevaluate in the future.    Pulmonary hypertension: Based on TTE with significant findings of RV enlargement, mildly reduced RV function, mildly dilated RA all new compared to 2021.  Confirmed on right heart cath 06/2021 with normal LVEDP, preserved CO and CI.  Given rapid progression of disease group 1 disease is most likely.  Suspect contribution of group 3 disease and hypoxemia and nocturnal hypoxia via as historically she has not used oxygen as prescribed now with better adherence.  No OSA on PSG.  Possible group 2 disease given diastolic dysfunction although not sure how big contributor this is.  No history of VTE/PE to suggest group 4 disease.  Long and frank discussion with patient.  She is not a very good candidate for pulmonary vasodilators given the multifactorial nature of disease, significant emphysema on  CT scan, demonstration of some lack of understanding of instructions in the past.  Given her significant emphysema, inhaled vasodilators have not demonstrated effectiveness in the past trials.  Systemic vasodilators pose significant risk of VQ mismatch.  We did discussed using pulmonary vasodilators both inhaled and oral.  Discussed at length the side effect profile and what to expect.  Discussed risk and benefits in terms of VQ mismatch in setting of parenchymal disease as well possible bilateral pulmonary edema depending on how well left-sided heart handles increased preload.  I think she would really struggle with 4 times daily medication such as Tyvaso.  We discussed at length that I would prefer this agent but given the 4 times daily dosing she does not think this is doable.  Nor does her husband.  Recommend starting daily tadalafil.  After long discussion she agrees to this today.  20 mg daily for 30 days then increase to 40 mg daily.  If tolerates well will add ERA.  I advised her to please use diuretics as recommended by her cardiologist as this is a mainstay in treatment of this condition.  Historically, she is struggled with adherence to the recommendations  and more recently with increased weight gain she did not take the increased dose of diuretics that she was instructed.  Return in about 2 months (around 02/20/2022).   Lanier Clam, MD 12/26/2021  I spent 43 minutes in the care of the patient including face-to-face visit, review of records, coordination of care.

## 2021-12-28 ENCOUNTER — Other Ambulatory Visit (HOSPITAL_COMMUNITY): Payer: Self-pay

## 2021-12-29 ENCOUNTER — Telehealth: Payer: Self-pay | Admitting: Pulmonary Disease

## 2021-12-29 NOTE — Telephone Encounter (Signed)
Received a call from [facility] regarding the Tadalafil, I spoke with Verdis Frederickson.  She advised me that it requires a prior auth and wanted to make sure we received the paperwork that was faxed over.  Advised that we have a pharmacy team that does our PA and I would send this to them to start the PA.  She stated she would follow up in a couple of days.  Pharmacy team:  Please start a PA on the Tadalafil.  Thank you.

## 2021-12-29 NOTE — Telephone Encounter (Signed)
Called and spoke with patient, she states that she is not happy with [employer] (her DME), she states when she gets her tanks, they are not completely full and they do not last longer than 2 hours.  Advised to call [employer] and see if there is anything that can be done.  She wanted to know if we recommended a DME company.  I advised her that we hear negative comments about all the DME companies and we do not recommend any one company.  I advised her that there is a waiting period where they cannot change companies and they also have to start over and do the qualifying walk again.  She said she would call [employer] and see what she could get done.  I let her know that our pharmacy team is starting the PA for the Tadalafil and we will contact her when we get the determination.

## 2022-01-02 ENCOUNTER — Other Ambulatory Visit (HOSPITAL_COMMUNITY): Payer: Self-pay

## 2022-01-02 ENCOUNTER — Telehealth: Payer: Self-pay

## 2022-01-02 NOTE — Telephone Encounter (Signed)
PA has been APPROVED from 01/02/2022-01/03/2023

## 2022-01-02 NOTE — Telephone Encounter (Signed)
Called and spoke with patient, provided information regarding PA for her Tadalafil.  Provided instructions per Dr. Kavin Leech last [place] note.  She verbalized understanding.  Nothing further needed.

## 2022-01-02 NOTE — Telephone Encounter (Signed)
PA requirement notification received from patient.  PA completed and submitted through [facility] to [facility].  Awaiting determination.  KeyJarrett Ables - PA Case ID: [id number]

## 2022-01-06 ENCOUNTER — Other Ambulatory Visit: Payer: Self-pay | Admitting: Medical

## 2022-01-06 NOTE — Telephone Encounter (Signed)
See encounter from 11/06.

## 2022-01-10 ENCOUNTER — Other Ambulatory Visit: Payer: Self-pay | Admitting: Medical

## 2022-01-10 DIAGNOSIS — K219 Gastro-esophageal reflux disease without esophagitis: Secondary | ICD-10-CM

## 2022-01-10 NOTE — Telephone Encounter (Signed)
Is this okay to refill?

## 2022-01-12 ENCOUNTER — Telehealth: Payer: Self-pay | Admitting: Medical

## 2022-01-12 NOTE — Telephone Encounter (Signed)
Jessica Hart from [facility] called and requested an order to get a rollator ordered through [facility] due to her trouble walking.

## 2022-01-24 ENCOUNTER — Telehealth: Payer: Self-pay | Admitting: Pulmonary Disease

## 2022-01-24 NOTE — Telephone Encounter (Signed)
Called and spoke with patient and she states that she is down to 1 pill left of her 54m Tadalafil. She is supposed to start 485mafter 30 days of trying 2095mShe states she tried to call the pharmacy when they sent her only 15 pills.   She is wanting to know is more medication going to be sent to her.   And does she need to call every month to get it refilled.   Please advise

## 2022-01-25 NOTE — Telephone Encounter (Signed)
Spoke with [facility] department for assistance. She received only 15 tablets because of tadalafil shortage nationwide. Per rep, they should be able to fill the full 30 day supply now.  Conference called with patient and pharmacy rep to schedule refill. Patient took last dose today and shipment has been scheduled to be sent out to her tomorrow.  Phone: [phone]  Knox Saliva, PharmD, MPH, BCPS, CPP Clinical Pharmacist (Rheumatology and Pulmonology)

## 2022-01-30 ENCOUNTER — Other Ambulatory Visit: Payer: Self-pay | Admitting: *Deleted

## 2022-01-30 DIAGNOSIS — Z122 Encounter for screening for malignant neoplasm of respiratory organs: Secondary | ICD-10-CM

## 2022-01-30 DIAGNOSIS — Z87891 Personal history of nicotine dependence: Secondary | ICD-10-CM

## 2022-03-06 ENCOUNTER — Ambulatory Visit (INDEPENDENT_AMBULATORY_CARE_PROVIDER_SITE_OTHER): Payer: PPO | Admitting: Pulmonary Disease

## 2022-03-06 ENCOUNTER — Encounter: Payer: Self-pay | Admitting: Pulmonary Disease

## 2022-03-06 VITALS — BP 134/80 | HR 79 | Ht 63.0 in | Wt 151.8 lb

## 2022-03-06 DIAGNOSIS — I270 Primary pulmonary hypertension: Secondary | ICD-10-CM

## 2022-03-06 MED ORDER — TRELEGY ELLIPTA 100-62.5-25 MCG/ACT IN AEPB: 1.0000 | INHALATION_SPRAY | Freq: Every day | RESPIRATORY_TRACT | 3 refills | Status: DC

## 2022-03-06 NOTE — Progress Notes (Signed)
_0  ID: Jessica Hart, female    DOB: 08-May-1940, 82 y.o.   MRN: [id number]  Chief Complaint  Patient presents with   Follow-up    Having some good stronger since she started taking new rx    Referring provider: Carlena Hurl, PA-C  HPI:   82 y.o. woman whom we are seeing in follow up for volume overload and diagnosis of pulmonary HTN on RHC.  Patient presents for routine follow-up.  Visit, weight down about 3 pounds, closer to baseline.  He started tadalafil last visit, 20 mg a day for 1 month month and increase to 40 mg.  She is tolerating this well, 40 mg daily.  She does have desaturations but she states these are no different than before.  Weight is down.  She feels like she can walk more, did some Christmas shopping, walk around shops which he previously did not do.  Overall has more energy, better appetite etc.  A lot of time spent in counseling, many questions about oxygen use.  Many questions about medication.  Clarified her medication regimen and how to take it, changes it may need to make in the future.  HPI at initial visit: Was in usual state of health.  Was seen PCP and routine follow-up.  Noted to be hypoxemic work-up, rooming.  Noted little bit of cough for 3 days.  She was placed on 1 L of oxygen.  Came up to 92%.  She was placed on Trelegy.  She was referred to pulmonary.  Home oxygen was delivered.  She represented to PCP about 10 days later.  Noted to be at 87% on 1 L with exertion although no changes were made to oxygen prescription.  She denies significant dyspnea, nothing too significant.  Able to do tasks he needs to do.  Reviewed multiple CT scans that shows mild emphysematous changes primarily in the upper lobes with interlobular septal thickening and bronchiectasis in bilateral lower lobes without clear honeycombing on my interpretation.  PMH: Hypertension, diabetes, hyperlipidemia, tobacco abuse in remission surgical history: Colon surgery,  hysterectomy Family history: CAD in first relatives, no significant [name] is a 82 year old with Social history: Former smoker, 45-pack-year, quit 2008, retired, lives with husband Restaurant manager, fast food / Pulmonary Flowsheets:   ACT:      No data to display           MMRC:     No data to display           Epworth:      No data to display           Tests:   FENO:  No results found for: "NITRICOXIDE"  PFT:    Latest Ref Rng & Units 04/06/2021    3:35 PM  PFT Results  FVC-Pre L 1.74   FVC-Predicted Pre % 89   FVC-Post L 1.64   FVC-Predicted Post % 84   Pre FEV1/FVC % % 83   Post FEV1/FCV % % 84   FEV1-Pre L 1.44   FEV1-Predicted Pre % 96   FEV1-Post L 1.37   DLCO uncorrected ml/min/mmHg 6.61   DLCO UNC% % 36   DLVA Predicted % 55   TLC L 3.72   TLC % Predicted % 75   RV % Predicted % 60   Personally reviewed interpret spirometry suggestive of moderate to mild restriction versus gas trapping, no bronchodilator response, no fixed obstruction, TLC moderately reduced, DLCO severely reduced  WALK:  No data to display           Imaging: Personally reviewed and as per EMR discussion of this note No results found.  Lab Results: Personally reviewed, no anemia, no polycythemia CBC    Component Value Date/Time   WBC 8.2 09/29/2021 0427   RBC 5.41 (H) 09/29/2021 0427   HGB 15.3 (H) 09/29/2021 0427   HGB 14.5 02/08/2021 1205   HCT 46.3 (H) 09/29/2021 0427   HCT 45.6 02/08/2021 1205   PLT 210 09/29/2021 0427   PLT 286 02/08/2021 1205   MCV 85.6 09/29/2021 0427   MCV 85 02/08/2021 1205   MCH 28.3 09/29/2021 0427   MCHC 33.0 09/29/2021 0427   RDW 18.1 (H) 09/29/2021 0427   RDW 14.6 02/08/2021 1205   LYMPHSABS 1.4 09/28/2021 2133   LYMPHSABS 1.9 02/08/2021 1205   MONOABS 0.4 09/28/2021 2133   EOSABS 0.1 09/28/2021 2133   EOSABS 0.1 02/08/2021 1205   BASOSABS 0.1 09/28/2021 2133   BASOSABS 0.1 02/08/2021 1205    BMET     Component Value Date/Time   NA 138 10/10/2021 1321   K 4.4 10/10/2021 1321   CL 100 10/10/2021 1321   CO2 20 10/10/2021 1321   GLUCOSE 125 (H) 10/10/2021 1321   GLUCOSE 106 (H) 09/29/2021 0427   BUN 22 10/10/2021 1321   CREATININE 1.46 (H) 10/10/2021 1321   CREATININE 0.69 01/10/2017 1235   CALCIUM 9.7 10/10/2021 1321   GFRNONAA 51 (L) 09/29/2021 0427   GFRNONAA 86 01/10/2017 1235   GFRAA 97 02/02/2020 1108   GFRAA 99 01/10/2017 1235    BNP    Component Value Date/Time   BNP 833.9 (H) 10/10/2021 1321   BNP 1,007.8 (H) 09/28/2021 2133    ProBNP    Component Value Date/Time   PROBNP 1,314.0 (H) 08/10/2021 1600    Specialty Problems       Pulmonary Problems   SOB (shortness of breath)   Pulmonary emphysema (HCC)   Pulmonary nodules   Chronic respiratory failure with hypoxia (HCC)    Allergies  Allergen Reactions   Lisinopril Itching and Cough    Immunization History  Administered Date(s) Administered   Fluad Quad(high Dose 65+) 11/11/2018   Influenza Whole 12/11/2007   Influenza, High Dose Seasonal PF 02/29/2016, 01/10/2017, 11/15/2017   Influenza,inj,Quad PF,6+ Mos 10/29/2012, 04/06/2014, 02/19/2015   PFIZER(Purple Top)SARS-COV-2 Vaccination 04/22/2019, 05/13/2019, 01/30/2020   Pneumococcal Conjugate-13 04/06/2014   Pneumococcal Polysaccharide-23 12/11/2007   Td 05/28/2004    Past Medical History:  Diagnosis Date   Arthritis    Cataract bilaterl 3 years ago   Coronary artery disease    Diabetes mellitus 02/27/2005   Diabetes mellitus type 2, controlled, without complications ([facility]) 1/60/7371   Qualifier: Diagnosis of  By: Drucie Ip     Diverticulosis    Dyspnea    Echocardiogram findings abnormal, without diagnosis 2005   EF 47%, no ischemia, no infarction   Former smoker 01/10/2017   45 pack year history, quit in 2007    GERD (gastroesophageal reflux disease)    Heart murmur    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   History of bone density study  2006   Lt hip = -1.0, L spine = -1.8    Hyperlipidemia    Hypertension     Tobacco History: Social History   Tobacco Use  Smoking Status Former   Packs/day: 1.00   Years: 45.00   Total pack years: 45.00   Types: Cigarettes   Quit date: 07/27/2005  Years since quitting: 16.6  Smokeless Tobacco Former   Quit date: 03/2005   Counseling given: Not Answered   Continue to not smoke  Outpatient Encounter Medications as of 03/06/2022  Medication Sig   acetaminophen (TYLENOL) 500 MG tablet Take 2 tablets (1,000 mg total) by mouth every 8 (eight) hours as needed.   aspirin EC 81 MG tablet Take 1 tablet (81 mg total) by mouth daily. Swallow whole.   Cholecalciferol (D3 5000) 125 MCG (5000 UT) capsule Take 5,000 Units by mouth daily.   CREON 36000-114000 units CPEP capsule TAKE 2 CAPSULES BY MOUTH 3 TIMES A DAY WITH MEALS AND TAKE 1 CAPSULE WITH EACH SNACK TWICE DAILY (Patient taking differently: Take 36,000 Units by mouth 3 (three) times daily with meals.)   dapagliflozin propanediol (FARXIGA) 5 MG TABS tablet Take 1 tablet (5 mg total) by mouth daily.   furosemide (LASIX) 20 MG tablet Take 3 tablets (60 mg total) by mouth daily.   insulin degludec (TRESIBA FLEXTOUCH) 100 UNIT/ML FlexTouch Pen Inject 15 Units into the skin daily after supper.   losartan (COZAAR) 50 MG tablet TAKE 1 TABLET BY MOUTH EVERY DAY   metFORMIN (GLUCOPHAGE-XR) 750 MG 24 hr tablet TAKE 1 TABLET BY MOUTH EVERY DAY WITH BREAKFAST   metoprolol tartrate (LOPRESSOR) 25 MG tablet Take 25 mg by mouth 2 (two) times daily.   Misc. Devices MISC SIG: 1 L of Oxygen via Nasal cannula around the clock Dispense Home Oxygen and Portable tank and necessary supplies. Dx: Hypoxemia, Shortness of breath (Patient taking differently: Place 4-4.5 L into the nose See admin instructions. 4 - 4.5 L of Oxygen via Nasal cannula around the clock Dispense Home Oxygen and Portable tank and necessary supplies. Dx: Hypoxemia, Shortness of breath)    Multiple Vitamins-Minerals (NO IRON MULT VITAMIN-MINERALS) TABS TAKE 1 TABLET BY MOUTH DAILY (Patient taking differently: Take 1 tablet by mouth daily.)   omeprazole (PRILOSEC) 40 MG capsule Take 1 capsule (40 mg total) by mouth daily.   potassium chloride (KLOR-CON) 10 MEQ tablet TAKE 2 TABLETS BY MOUTH EVERY DAY   rosuvastatin (CRESTOR) 20 MG tablet Take 1 tablet (20 mg total) by mouth daily.   tadalafil, PAH, (ADCIRCA) 20 MG tablet Take 1 tablet (20 mg total) by mouth daily for 30 days, THEN 2 tablets (40 mg total) daily.   vitamin B-12 (CYANOCOBALAMIN) 500 MCG tablet Take 500 mcg by mouth daily.   [DISCONTINUED] TRELEGY ELLIPTA 100-62.5-25 MCG/ACT AEPB TAKE 1 PUFF BY MOUTH EVERY DAY (Patient taking differently: Take 1 puff by mouth daily.)   Fluticasone-Umeclidin-Vilant (TRELEGY ELLIPTA) 100-62.5-25 MCG/ACT AEPB Inhale 1 puff into the lungs daily. TAKE 1 PUFF BY MOUTH EVERY DAY Strength: 100-62.5-25 MCG/ACT   No facility-administered encounter medications on file as of 03/06/2022.     Review of Systems  Review of Systems  N/a Physical Exam  BP 134/80 (BP Location: Left Arm, Cuff Size: Normal)   Pulse 79   Ht _0  (1.6 m)   Wt 151 lb 12.8 oz (68.9 kg)   SpO2 91% Comment: 4L continue  BMI 26.89 kg/m   Wt Readings from Last 5 Encounters:  03/06/22 151 lb 12.8 oz (68.9 kg)  12/21/21 154 lb (69.9 kg)  11/09/21 154 lb 3.2 oz (69.9 kg)  10/10/21 148 lb 12.8 oz (67.5 kg)  09/30/21 145 lb 14.4 oz (66.2 kg)    BMI Readings from Last 5 Encounters:  03/06/22 26.89 kg/m  12/21/21 27.28 kg/m  11/09/21 27.32 kg/m  10/10/21 26.36  kg/m  09/30/21 25.85 kg/m     Physical Exam General: Well-appearing, no acute distress Eyes: EOMI, no icterus Pulmonary: Clear, normal breathing, no crackles Cardiovascular: Regular rate and rhythm, no murmurs MSK: No synovitis, no joint effusion Neuro: Normal gait, no weakness Psych: Normal mood, full affect   Assessment & Plan:   Chronic  hypoxemic respiratory failure: Suspect related to emphysema seen on CT scan.  Possible basilar fibrosis could not appear classic for UIP but no other clear pattern.  Possibly due to recurrent aspiration in setting of long history of GERD.  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  Also suspect contribution of VQ mismatch due to recently discovered likely pulmonary hypertension on TTE.  It was reiterated again how she should be using oxygen.  She is to use all times during the day at rest and with exertion.  Stressed keeping oxygen saturation above 90%, ideally greater than 92%.  Continue nocturnal oxygen, 4 L.  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  She has poor insight into this. Consider antifibrotic's but she has declined other medications to treat pulmonary issues.  Can reevaluate in the future.    Pulmonary hypertension: Based on TTE with significant findings of RV enlargement, mildly reduced RV function, mildly dilated RA all new compared to 2021.  Confirmed on right heart cath 06/2021 with normal LVEDP, preserved CO and CI.  Given rapid progression of disease group 1 disease is most likely.  Suspect contribution of group 3 disease and hypoxemia and nocturnal hypoxia via as historically she has not used oxygen as prescribed now with better adherence.  No OSA on PSG.  Possible group 2 disease given diastolic dysfunction although not sure how big contributor this is.  No history of VTE/PE to suggest group 4 disease.  Long and frank discussion with patient.  She is not a very good candidate for pulmonary vasodilators given the multifactorial nature of disease, significant emphysema on CT scan, demonstration of some lack of understanding of instructions in the past.  Given her significant emphysema, inhaled vasodilators have not demonstrated effectiveness in the past trials.  Systemic vasodilators pose significant risk of VQ mismatch.  We did discussed using pulmonary vasodilators both inhaled  and oral.  Discussed at length the side effect profile and what to expect.  Discussed risk and benefits in terms of VQ mismatch in setting of parenchymal disease as well possible bilateral pulmonary edema depending on how well left-sided heart handles increased preload.  I think she would really struggle with 4 times daily medication such as Tyvaso.  We discussed at length that I would prefer this agent but given the 4 times daily dosing she does not think this is doable.  Nor does her husband.  Tolerating tadalafil 40 mg daily with improved subjective DOE and appetite, energy. Weight stable to improved.  Straits ongoing tolerance, tentative plan to add ERA at next visit.  Will check BNP today to compare to pretadalafil measurement.  Encouraged her to continue Lasix as prescribed.  Return in about 2 months (around 05/05/2022).   Lanier Clam, MD 03/06/2022  I spent 41 minutes in the care of the patient including face-to-face visit, review of records, coordination of care.

## 2022-03-06 NOTE — Patient Instructions (Signed)
Nice to see you   Continue tadalafil 40 mg (2 tablets) once daily  Lab work today  We can consider adding more medicines in the future  Return to clinic in 2 months or sooner as needed

## 2022-03-07 LAB — BRAIN NATRIURETIC PEPTIDE: Pro B Natriuretic peptide (BNP): 1156 pg/mL — ABNORMAL HIGH (ref 0.0–100.0)

## 2022-03-08 ENCOUNTER — Other Ambulatory Visit: Payer: Self-pay | Admitting: Internal Medicine

## 2022-03-13 ENCOUNTER — Other Ambulatory Visit: Payer: Self-pay | Admitting: Internal Medicine

## 2022-03-13 ENCOUNTER — Telehealth: Payer: Self-pay | Admitting: Internal Medicine

## 2022-03-13 MED ORDER — TRESIBA FLEXTOUCH 100 UNIT/ML ~~LOC~~ SOPN: 15.0000 [IU] | PEN_INJECTOR | Freq: Every day | SUBCUTANEOUS | 1 refills | Status: DC

## 2022-03-13 NOTE — Telephone Encounter (Signed)
Pt needs refill to pharmacy for tresiba

## 2022-03-17 ENCOUNTER — Ambulatory Visit: Payer: Medicare HMO

## 2022-03-21 ENCOUNTER — Other Ambulatory Visit: Payer: Self-pay

## 2022-03-21 ENCOUNTER — Ambulatory Visit (INDEPENDENT_AMBULATORY_CARE_PROVIDER_SITE_OTHER): Payer: PPO

## 2022-03-21 VITALS — Ht 63.0 in | Wt 150.0 lb

## 2022-03-21 DIAGNOSIS — Z0000 Encounter for general adult medical examination without abnormal findings: Secondary | ICD-10-CM | POA: Diagnosis not present

## 2022-03-21 MED ORDER — FUROSEMIDE 20 MG PO TABS: 60.0000 mg | ORAL_TABLET | Freq: Every day | ORAL | 0 refills | Status: DC

## 2022-03-21 NOTE — Patient Instructions (Signed)
Jessica Hart , Thank you for taking time to come for your Medicare Wellness Visit. I appreciate your ongoing commitment to your health goals. Please review the following plan we discussed and let me know if I can assist you in the future.   These are the goals we discussed:  Goals      Exercise 2x per week (30 min per time) at [facility]     Patient Stated     03/11/2021, wants to live to 89 Hopes to get a portable oxygen tank so she can go more        This is a list of the screening recommended for you and due dates:  Health Maintenance  Topic Date Due   Zoster (Shingles) Vaccine (1 of 2) Never done   DTaP/Tdap/Td vaccine (2 - Tdap) 05/29/2014   Lipid (cholesterol) test  11/11/2020   Complete foot exam   02/01/2021   Flu Shot  09/27/2021   COVID-19 Vaccine (4 - 2023-24 season) 10/28/2021   Yearly kidney health urinalysis for diabetes  02/08/2022   Hemoglobin A1C  04/12/2022   Eye exam for diabetics  05/04/2022   Yearly kidney function blood test for diabetes  10/11/2022   Medicare Annual Wellness Visit  03/22/2023   Pneumonia Vaccine  Completed   DEXA scan (bone density measurement)  Completed   Hepatitis C Screening: USPSTF Recommendation to screen - Ages 37-79 yo.  Completed   HPV Vaccine  Aged Out    Advanced directives: in chart  Conditions/risks identified: Continue trying to exercise a little, watch your oxygen, eat healthy, stay hydrated.  Next appointment: Follow up in one year for your annual wellness visit    Preventive Care 65 Years and Older, Female Preventive care refers to lifestyle choices and visits with your health care provider that can promote health and wellness. What does preventive care include? A yearly physical exam. This is also called an annual well check. Dental exams once or twice a year. Routine eye exams. Ask your health care provider how often you should have your eyes checked. Personal lifestyle choices, including: Daily care of your teeth and  gums. Regular physical activity. Eating a healthy diet. Avoiding tobacco and drug use. Limiting alcohol use. Practicing safe sex. Taking low-dose aspirin every day. Taking vitamin and mineral supplements as recommended by your health care provider. What happens during an annual well check? The services and screenings done by your health care provider during your annual well check will depend on your age, overall health, lifestyle risk factors, and family history of disease. Counseling  Your health care provider may ask you questions about your: Alcohol use. Tobacco use. Drug use. Emotional well-being. Home and relationship well-being. Sexual activity. Eating habits. History of falls. Memory and ability to understand (cognition). Work and work Statistician. Reproductive health. Screening  You may have the following tests or measurements: Height, weight, and BMI. Blood pressure. Lipid and cholesterol levels. These may be checked every 5 years, or more frequently if you are over 43 years old. Skin check. Lung cancer screening. You may have this screening every year starting at age 49 if you have a 30-pack-year history of smoking and currently smoke or have quit within the past 15 years. Fecal occult blood test (FOBT) of the stool. You may have this test every year starting at age 2. Flexible sigmoidoscopy or colonoscopy. You may have a sigmoidoscopy every 5 years or a colonoscopy every 10 years starting at age 27. Hepatitis C blood test.  Hepatitis B blood test. Sexually transmitted disease (STD) testing. Diabetes screening. This is done by checking your blood sugar (glucose) after you have not eaten for a while (fasting). You may have this done every 1-3 years. Bone density scan. This is done to screen for osteoporosis. You may have this done starting at age 38. Mammogram. This may be done every 1-2 years. Talk to your health care provider about how often you should have regular  mammograms. Talk with your health care provider about your test results, treatment options, and if necessary, the need for more tests. Vaccines  Your health care provider may recommend certain vaccines, such as: Influenza vaccine. This is recommended every year. Tetanus, diphtheria, and acellular pertussis (Tdap, Td) vaccine. You may need a Td booster every 10 years. Zoster vaccine. You may need this after age 87. Pneumococcal 13-valent conjugate (PCV13) vaccine. One dose is recommended after age 36. Pneumococcal polysaccharide (PPSV23) vaccine. One dose is recommended after age 63. Talk to your health care provider about which screenings and vaccines you need and how often you need them. This information is not intended to replace advice given to you by your health care provider. Make sure you discuss any questions you have with your health care provider. Document Released: 03/12/2015 Document Revised: 11/03/2015 Document Reviewed: 12/15/2014 Elsevier Interactive Patient Education  2017 [employer] Prevention in the Home Falls can cause injuries. They can happen to people of all ages. There are many things you can do to make your home safe and to help prevent falls. What can I do on the outside of my home? Regularly fix the edges of walkways and driveways and fix any cracks. Remove anything that might make you trip as you walk through a door, such as a raised step or threshold. Trim any bushes or trees on the path to your home. Use bright outdoor lighting. Clear any walking paths of anything that might make someone trip, such as rocks or tools. Regularly check to see if handrails are loose or broken. Make sure that both sides of any steps have handrails. Any raised decks and porches should have guardrails on the edges. Have any leaves, snow, or ice cleared regularly. Use sand or salt on walking paths during winter. Clean up any spills in your garage right away. This includes oil  or grease spills. What can I do in the bathroom? Use night lights. Install grab bars by the toilet and in the tub and shower. Do not use towel bars as grab bars. Use non-skid mats or decals in the tub or shower. If you need to sit down in the shower, use a plastic, non-slip stool. Keep the floor dry. Clean up any water that spills on the floor as soon as it happens. Remove soap buildup in the tub or shower regularly. Attach bath mats securely with double-sided non-slip rug tape. Do not have throw rugs and other things on the floor that can make you trip. What can I do in the bedroom? Use night lights. Make sure that you have a light by your bed that is easy to reach. Do not use any sheets or blankets that are too big for your bed. They should not hang down onto the floor. Have a firm chair that has side arms. You can use this for support while you get dressed. Do not have throw rugs and other things on the floor that can make you trip. What can I do in the kitchen? Clean up  any spills right away. Avoid walking on wet floors. Keep items that you use a lot in easy-to-reach places. If you need to reach something above you, use a strong step stool that has a grab bar. Keep electrical cords out of the way. Do not use floor polish or wax that makes floors slippery. If you must use wax, use non-skid floor wax. Do not have throw rugs and other things on the floor that can make you trip. What can I do with my stairs? Do not leave any items on the stairs. Make sure that there are handrails on both sides of the stairs and use them. Fix handrails that are broken or loose. Make sure that handrails are as long as the stairways. Check any carpeting to make sure that it is firmly attached to the stairs. Fix any carpet that is loose or worn. Avoid having throw rugs at the top or bottom of the stairs. If you do have throw rugs, attach them to the floor with carpet tape. Make sure that you have a light  switch at the top of the stairs and the bottom of the stairs. If you do not have them, ask someone to add them for you. What else can I do to help prevent falls? Wear shoes that: Do not have high heels. Have rubber bottoms. Are comfortable and fit you well. Are closed at the toe. Do not wear sandals. If you use a stepladder: Make sure that it is fully opened. Do not climb a closed stepladder. Make sure that both sides of the stepladder are locked into place. Ask someone to hold it for you, if possible. Clearly mark and make sure that you can see: Any grab bars or handrails. First and last steps. Where the edge of each step is. Use tools that help you move around (mobility aids) if they are needed. These include: Canes. Walkers. Scooters. Crutches. Turn on the lights when you go into a dark area. Replace any light bulbs as soon as they burn out. Set up your furniture so you have a clear path. Avoid moving your furniture around. If any of your floors are uneven, fix them. If there are any pets around you, be aware of where they are. Review your medicines with your doctor. Some medicines can make you feel dizzy. This can increase your chance of falling. Ask your doctor what other things that you can do to help prevent falls. This information is not intended to replace advice given to you by your health care provider. Make sure you discuss any questions you have with your health care provider. Document Released: 12/10/2008 Document Revised: 07/22/2015 Document Reviewed: 03/20/2014 Elsevier Interactive Patient Education  2017 [employer].

## 2022-03-21 NOTE — Telephone Encounter (Signed)
She has asked cardiology, but no response and she is out. Thank you

## 2022-03-21 NOTE — Progress Notes (Signed)
Subjective:   Jessica Hart is a 82 y.o. female who presents for Medicare Annual (Subsequent) preventive examination.  I connected with  [name] on 03/21/22 by a audio enabled telemedicine application and verified that I am speaking with the correct person using two identifiers.  Patient Location: Home  Provider Location: Home Office  I discussed the limitations of evaluation and management by telemedicine. The patient expressed understanding and agreed to proceed.   Review of Systems     Cardiac Risk Factors include: advanced age (>3mn, >>34women);diabetes mellitus;hypertension;smoking/ tobacco exposure;sedentary lifestyle;Other (see comment);dyslipidemia, Risk factor comments: CAD, hx of CABG, CHF, atherosclerosis, COPD, hypoxia     Objective:    Today's Vitals   03/21/22 0951  Weight: 150 lb (68 kg)  Height: _0  (1.6 m)   Body mass index is 26.57 kg/m.     03/21/2022   10:20 AM 09/28/2021    9:21 PM 07/12/2021    3:42 PM 03/11/2021    4:01 PM 09/02/2019    6:41 AM 08/29/2019   11:56 AM 08/20/2019    7:07 AM  Advanced Directives  Does Patient Have a Medical Advance Directive? Yes No No Yes No No No  Type of AParamedicof ADevolLiving will   Out of facility DNR (pink MOST or yellow form)     Does patient want to make changes to medical advance directive? No - Patient declined    No - Patient declined No - Patient declined   Copy of HEnhautin Chart? Yes - validated most recent copy scanned in chart (See row information)        Would patient like information on creating a medical advance directive?  No - Patient declined   No - Patient declined  No - Patient declined    Current Medications (verified) Outpatient Encounter Medications as of 03/21/2022  Medication Sig   acetaminophen (TYLENOL) 500 MG tablet Take 2 tablets (1,000 mg total) by mouth every 8 (eight) hours as needed.   aspirin EC 81 MG tablet Take  1 tablet (81 mg total) by mouth daily. Swallow whole.   Cholecalciferol (D3 5000) 125 MCG (5000 UT) capsule Take 5,000 Units by mouth daily.   CREON 36000-114000 units CPEP capsule TAKE 2 CAPSULES BY MOUTH 3 TIMES A DAY WITH MEALS AND TAKE 1 CAPSULE WITH EACH SNACK TWICE DAILY (Patient taking differently: Take 36,000 Units by mouth 3 (three) times daily with meals.)   dapagliflozin propanediol (FARXIGA) 5 MG TABS tablet Take 1 tablet (5 mg total) by mouth daily.   Fluticasone-Umeclidin-Vilant (TRELEGY ELLIPTA) 100-62.5-25 MCG/ACT AEPB Inhale 1 puff into the lungs daily. TAKE 1 PUFF BY MOUTH EVERY DAY Strength: 100-62.5-25 MCG/ACT   furosemide (LASIX) 20 MG tablet Take 3 tablets (60 mg total) by mouth daily.   insulin degludec (TRESIBA FLEXTOUCH) 100 UNIT/ML FlexTouch Pen Inject 15 Units into the skin daily after supper.   losartan (COZAAR) 50 MG tablet TAKE 1 TABLET BY MOUTH EVERY DAY   metFORMIN (GLUCOPHAGE-XR) 750 MG 24 hr tablet TAKE 1 TABLET BY MOUTH EVERY DAY WITH BREAKFAST   metoprolol tartrate (LOPRESSOR) 25 MG tablet Take 25 mg by mouth 2 (two) times daily.   Misc. Devices MISC SIG: 1 L of Oxygen via Nasal cannula around the clock Dispense Home Oxygen and Portable tank and necessary supplies. Dx: Hypoxemia, Shortness of breath (Patient taking differently: Place 4-4.5 L into the nose See admin instructions. 4 - 4.5 L of Oxygen  via Nasal cannula around the clock Dispense Home Oxygen and Portable tank and necessary supplies. Dx: Hypoxemia, Shortness of breath)   Multiple Vitamins-Minerals (NO IRON MULT VITAMIN-MINERALS) TABS TAKE 1 TABLET BY MOUTH DAILY (Patient taking differently: Take 1 tablet by mouth daily.)   omeprazole (PRILOSEC) 40 MG capsule Take 1 capsule (40 mg total) by mouth daily.   ONETOUCH VERIO test strip SMARTSIG:1 Each Via Meter Daily   potassium chloride (KLOR-CON) 10 MEQ tablet TAKE 2 TABLETS BY MOUTH EVERY DAY   rosuvastatin (CRESTOR) 20 MG tablet Take 1 tablet (20 mg total)  by mouth daily.   tadalafil, PAH, (ADCIRCA) 20 MG tablet Take 1 tablet (20 mg total) by mouth daily for 30 days, THEN 2 tablets (40 mg total) daily.   vitamin B-12 (CYANOCOBALAMIN) 500 MCG tablet Take 500 mcg by mouth daily.   No facility-administered encounter medications on file as of 03/21/2022.    Allergies (verified) Lisinopril   History: Past Medical History:  Diagnosis Date   Arthritis    Cataract bilaterl 3 years ago   Coronary artery disease    Diabetes mellitus 02/27/2005   Diabetes mellitus type 2, controlled, without complications ([place]) 10/30/4095   Qualifier: Diagnosis of  By: Drucie Ip     Diverticulosis    Dyspnea    Echocardiogram findings abnormal, without diagnosis 2005   EF 47%, no ischemia, no infarction   Former smoker 01/10/2017   45 pack year history, quit in 2007    GERD (gastroesophageal reflux disease)    Heart murmur    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   History of bone density study 2006   Lt hip = -1.0, L spine = -1.8    Hyperlipidemia    Hypertension    Past Surgical History:  Procedure Laterality Date   ABDOMINAL HYSTERECTOMY  1989   one ovary remains per patient   CARDIAC CATHETERIZATION  08/20/2019   COLONOSCOPY     CORONARY ARTERY BYPASS GRAFT N/A 09/02/2019   Procedure: CORONARY ARTERY BYPASS GRAFTING (CABG) TIMES THREE USING LEFT INTERNAL MAMMARY ARTERY AND RIGHT GREATER SAPHENOUS VEIN;  Surgeon: Lajuana Matte, MD;  Location: [place];  Service: Open Heart Surgery;  Laterality: N/A;   ENDOVEIN HARVEST OF GREATER SAPHENOUS VEIN Right 09/02/2019   Procedure: ENDOVEIN HARVEST OF GREATER SAPHENOUS VEIN;  Surgeon: Lajuana Matte, MD;  Location: [facility];  Service: Open Heart Surgery;  Laterality: Right;   EYE SURGERY Bilateral    CATARACT   LEFT HEART CATH AND CORONARY ANGIOGRAPHY N/A 08/20/2019   Procedure: LEFT HEART CATH AND CORONARY ANGIOGRAPHY;  Surgeon: Belva Crome, MD;  Location: [facility];  Service: Cardiovascular;   Laterality: N/A;   RIGHT AND LEFT HEART CATH N/A 07/13/2021   Procedure: RIGHT AND LEFT HEART CATH;  Surgeon: Early Osmond, MD;  Location: [facility];  Service: Cardiovascular;  Laterality: N/A;   TEE WITHOUT CARDIOVERSION N/A 09/02/2019   Procedure: TRANSESOPHAGEAL ECHOCARDIOGRAM (TEE);  Surgeon: Lajuana Matte, MD;  Location: [place];  Service: Open Heart Surgery;  Laterality: N/A;   TONSILECTOMY, ADENOIDECTOMY, BILATERAL MYRINGOTOMY AND TUBES  1965   TONSILLECTOMY     Family History  Problem Relation Age of Onset   Heart disease Father    Cancer Sister    Tongue cancer Sister    Colon polyps Neg Hx    Rectal cancer Neg Hx    Stomach cancer Neg Hx    Social History   Socioeconomic History   Marital  status: Married    Spouse name: Richard   Number of children: 1   Years of education: 12   Highest education level: High school graduate  Occupational History   Occupation: Retire-Textile    Employer: RETIRED  Tobacco Use   Smoking status: Former    Packs/day: 1.00    Years: 45.00    Total pack years: 45.00    Types: Cigarettes    Quit date: 07/27/2005    Years since quitting: 16.6   Smokeless tobacco: Former    Quit date: 03/2005  Vaping Use   Vaping Use: Never used  Substance and Sexual Activity   Alcohol use: Not Currently    Comment: beer occasionally    Drug use: No   Sexual activity: Not Currently    Partners: Male    Comment: 1st intercourse-22, partners- 12, married- 48 yrs   Other Topics Concern   Not on file  Social History Narrative   Health Care POA:    Emergency Contact: husband, Richard Ambulance person   End of Life Plan:    Who lives with you: Lives with husband   Any pets: gold fish   Diet: Patient has a varied diet and reports stuggeling with portion size and ice cream.   Exercise: Patient does not have currently exercise routine.   Seatbelts: Patient reports wearing seatbelt when in vehicle.   Sun Exposure/Protection: Patient reports not  using sun protection.   Hobbies: Bingo         Social Determinants of Health   Financial Resource Strain: Low Risk  (03/21/2022)   Overall Financial Resource Strain (CARDIA)    Difficulty of Paying Living Expenses: Not hard at all  Food Insecurity: No Food Insecurity (03/21/2022)   Hunger Vital Sign    Worried About Running Out of Food in the Last Year: Never true    Ran Out of Food in the Last Year: Never true  Transportation Needs: No Transportation Needs (03/21/2022)   PRAPARE - Hydrologist (Medical): No    Lack of Transportation (Non-Medical): No  Physical Activity: Insufficiently Active (03/21/2022)   Exercise Vital Sign    Days of Exercise per Week: 7 days    Minutes of Exercise per Session: 10 min  Stress: No Stress Concern Present (03/21/2022)   New     Feeling of Stress : Not at all  Social Connections: Moderately Isolated (03/21/2022)   Social Connection and Isolation Panel [NHANES]    Frequency of Communication with Friends and Family: More than three times a week    Frequency of Social Gatherings with Friends and Family: Never    Attends Religious Services: Never    Marine scientist or Organizations: No    Attends Music therapist: Never    Marital Status: Married    Tobacco Counseling Counseling given: Not Answered   Clinical Intake:  Pre-visit preparation completed: Yes  Pain : No/denies pain     BMI - recorded: 26.57 Nutritional Status: BMI 25 -29 Overweight Nutritional Risks: None Diabetes: Yes CBG done?: No Did pt. bring in CBG monitor from home?: No  How often do you need to have someone help you when you read instructions, pamphlets, or other written materials from your doctor or pharmacy?: 1 - Never  Diabetic? Nutrition Risk Assessment:  Has the patient had any N/V/D within the last 2 months?  No  Does the patient have any  non-healing wounds?  No  Has the patient had any unintentional weight loss or weight gain?  No   Diabetes:  Is the patient diabetic?  Yes  If diabetic, was a CBG obtained today?  Yes  Did the patient bring in their glucometer from home?  No  How often do you monitor your CBG's? Once daily fasting - 80 this am per pt.   Financial Strains and Diabetes Management:  Are you having any financial strains with the device, your supplies or your medication? No .  Does the patient want to be seen by Chronic Care Management for management of their diabetes?  No  Would the patient like to be referred to a Nutritionist or for Diabetic Management?  No   Diabetic Exams:  Diabetic Eye Exam: Completed 05/03/2021 Diabetic Foot Exam: Overdue, Pt has been advised about the importance in completing this exam. Pt is scheduled for diabetic foot exam on 04/17/2022.   Interpreter Needed?: No  Information entered by :: Berk Pilot, LPN   Activities of Daily Living    03/21/2022   10:20 AM 09/29/2021    2:00 PM  In your present state of health, do you have any difficulty performing the following activities:  Hearing? 1 0  Comment mild - no hearing aids   Vision? 0 0  Difficulty concentrating or making decisions? 1 0  Comment a little   Walking or climbing stairs? 0 0  Comment just gets out of breath easy   Dressing or bathing? 1 0  Comment gets out of breath - husband helps   Doing errands, shopping? 1 1  Comment doesn't drive - husband Scientist, water quality and eating ? Y   Comment gets out of breath and chest hurts - husband does most house work   Using [employer]? N   In the past six months, have you accidently leaked urine? Y   Comment only if she waits too long - sometimes she wears pullups   Do you have problems with loss of bowel control? Y   Comment not often - only if she waits to long or eats wrong foods   Managing your Medications? N   Managing your Finances? N   Housekeeping or  managing your Housekeeping? Y     Patient Care Team: Tysinger, Camelia Eng, PA-C as PCP - General (Family Medicine) Elouise Munroe, MD as PCP - Cardiology (Cardiology) Pa, [facility], Bonna Gains, MD as Consulting Physician (Pulmonary Disease) Mauri Pole, MD as Consulting Physician (Gastroenterology)  Indicate any recent Medical Services you may have received from other than [facility] providers in the past year (date may be approximate).     Assessment:   This is a routine wellness examination for Priscilla.  Hearing/Vision screen Hearing Screening - Comments:: Says she can't hear as well as she used to, but not having any trouble with telehealth visit. Vision Screening - Comments:: Wears rx glasses - up to date with routine eye exams with [facility]  Dietary issues and exercise activities discussed: Current Exercise Habits: Home exercise routine, Type of exercise: Other - see comments (exercise bike), Time (Minutes): 10, Frequency (Times/Week): 7, Weekly Exercise (Minutes/Week): 70, Intensity: Mild, Exercise limited by: cardiac condition(s);respiratory conditions(s);orthopedic condition(s)   Goals Addressed             This Visit's Progress    Patient Stated   On track    03/11/2021, wants to live to 90 Hopes to get a portable oxygen tank so  she can go more       Depression Screen    03/21/2022   10:14 AM 03/11/2021    4:01 PM 08/02/2020   10:37 AM 02/06/2020    9:28 AM 02/02/2020    9:53 AM 12/09/2019   10:40 AM 01/29/2019   10:00 AM  PHQ 2/9 Scores  PHQ - 2 Score 0 0 0 0 0 0 0    Fall Risk    03/21/2022    9:59 AM 03/11/2021    4:01 PM 08/02/2020   10:37 AM 02/02/2020    9:53 AM 12/09/2019   11:43 AM  [place] in the past year? 0 0 0 0 0  Number falls in past yr: 0  0 0 0  Injury with Fall? 0  0 0 0  Risk for fall due to : Orthopedic patient Medication side effect No Fall Risks  Impaired balance/gait  Follow up Falls prevention  discussed;Education provided Falls evaluation completed;Education provided;Falls prevention discussed Falls evaluation completed  Falls evaluation completed    FALL RISK PREVENTION PERTAINING TO THE HOME:  Any stairs in or around the home? Yes  If so, are there any without handrails? No  Home free of loose throw rugs in walkways, pet beds, electrical cords, etc? Yes  Adequate lighting in your home to reduce risk of falls? Yes   ASSISTIVE DEVICES UTILIZED TO PREVENT FALLS:  Life alert?  No, she had one, but she doesn't use it Use of a cane, walker or w/c? No  Grab bars in the bathroom? No  Shower chair or bench in shower? No  Elevated toilet seat or a handicapped toilet? No   TIMED UP AND GO:  Was the test performed? No . televisit  Cognitive Function:    10/29/2012    3:00 PM 10/26/2011    4:00 PM 07/28/2010   10:00 AM  MMSE - Mini Mental State Exam  Orientation to time _0 Orientation to Place _1 Registration _2 Attention/ Calculation _3 Recall _4 Language- name 2 objects _5 Language- repeat _6 Language- follow 3 step command _7 Language- read & follow direction _8 Write a sentence _9 Copy design _10 Total score _11 03/21/2022   10:27 AM 03/11/2021    4:03 PM  6CIT Screen  What Year? 0 points 0 points  What month? 0 points 0 points  What time? 0 points 0 points  Count back from 20 0 points 0 points  Months in reverse 0 points 0 points  Repeat phrase 0 points 6 points  Total Score 0 points 6 points    Immunizations Immunization History  Administered Date(s) Administered   Fluad Quad(high Dose 65+) 11/11/2018   Influenza Whole 12/11/2007   Influenza, High Dose Seasonal PF 02/29/2016, 01/10/2017, 11/15/2017   Influenza,inj,Quad PF,6+ Mos 10/29/2012, 04/06/2014, 02/19/2015   PFIZER(Purple Top)SARS-COV-2 Vaccination 04/22/2019, 05/13/2019, 01/30/2020   Pneumococcal Conjugate-13 04/06/2014   Pneumococcal  Polysaccharide-23 12/11/2007   Td 05/28/2004    TDAP status: Due, Education has been provided regarding the importance of this vaccine. Advised may receive this vaccine at local pharmacy or Health Dept. Aware to provide a copy of the vaccination record if obtained from local pharmacy or Health Dept. Verbalized acceptance and understanding.  Flu Vaccine status:  Due, Education has been provided regarding the importance of this vaccine. Advised may receive this vaccine at local pharmacy or Health Dept. Aware to provide a copy of the vaccination record if obtained from local pharmacy or Health Dept. Verbalized acceptance and understanding.  Pneumococcal vaccine status: Up to date  Covid-19 vaccine status: Information provided on how to obtain vaccines.   Qualifies for Shingles Vaccine? Yes   Zostavax completed No   Shingrix Completed?: No.    Education has been provided regarding the importance of this vaccine. Patient has been advised to call insurance company to determine out of pocket expense if they have not yet received this vaccine. Advised may also receive vaccine at local pharmacy or Health Dept. Verbalized acceptance and understanding.  Screening Tests Health Maintenance  Topic Date Due   Zoster Vaccines- Shingrix (1 of 2) Never done   DTaP/Tdap/Td (2 - Tdap) 05/29/2014   LIPID PANEL  11/11/2020   FOOT EXAM  02/01/2021   INFLUENZA VACCINE  09/27/2021   COVID-19 Vaccine (4 - 2023-24 season) 10/28/2021   Diabetic kidney evaluation - Urine ACR  02/08/2022   HEMOGLOBIN A1C  04/12/2022   OPHTHALMOLOGY EXAM  05/04/2022   Diabetic kidney evaluation - eGFR measurement  10/11/2022   Medicare Annual Wellness (AWV)  03/22/2023   Pneumonia Vaccine 74+ Years old  Completed   DEXA SCAN  Completed   Hepatitis C Screening  Completed   HPV VACCINES  Aged Out    Health Maintenance  Health Maintenance Due  Topic Date Due   Zoster Vaccines- Shingrix (1 of 2) Never done   DTaP/Tdap/Td (2 -  Tdap) 05/29/2014   LIPID PANEL  11/11/2020   FOOT EXAM  02/01/2021   INFLUENZA VACCINE  09/27/2021   COVID-19 Vaccine (4 - 2023-24 season) 10/28/2021   Diabetic kidney evaluation - Urine ACR  02/08/2022    Colorectal cancer screening: No longer required.   Mammogram status: Completed 08/12/2020. Repeat every year She has an active order in chart through 06/2022. She declines any more.  Bone Density status: Completed 03/01/2016. Results reflect: Bone density results: NORMAL. Repeat every (no need to repeat) years.  Lung Cancer Screening: (Low Dose CT Chest recommended if Age 72-80 years, 30 pack-year currently smoking OR have quit w/in 15years.) does qualify.   Lung Cancer Screening Referral: active order in chart through 01/2023 - wants to discuss with provider first.  Additional Screening:  Hepatitis C Screening: does qualify; Completed 02/02/2020  Vision Screening: Recommended annual ophthalmology exams for early detection of glaucoma and other disorders of the eye. Is the patient up to date with their annual eye exam?  Yes  Who is the provider or what is the name of the office in which the patient attends annual eye exams? [facility] If pt is not established with a provider, would they like to be referred to a provider to establish care? No .   Dental Screening: Recommended annual dental exams for proper oral hygiene  Community Resource Referral / Chronic Care Management: CRR required this visit?  No   CCM required this visit?  No      Plan:     I have personally reviewed and noted the following in the patient's chart:   Medical and social history Use of alcohol, tobacco or illicit drugs  Current medications and supplements including opioid prescriptions. Patient is not currently taking opioid prescriptions. Functional ability and status Nutritional status Physical activity Advanced directives List of other physicians Hospitalizations, surgeries, and ER visits  in previous  12 months Vitals Screenings to include cognitive, depression, and falls Referrals and appointments  In addition, I have reviewed and discussed with patient certain preventive protocols, quality metrics, and best practice recommendations. A written personalized care plan for preventive services as well as general preventive health recommendations were provided to patient.     Sandrea Hammond, LPN   1/84/0375   Nurse Notes: [facility] is supposed to be sending a HHN out, but haven't so far. Admits to forgetting to do [place] injection at night sometimes

## 2022-03-23 ENCOUNTER — Encounter: Payer: Self-pay | Admitting: Gastroenterology

## 2022-03-30 ENCOUNTER — Other Ambulatory Visit: Payer: Self-pay | Admitting: Medical

## 2022-03-30 DIAGNOSIS — I10 Essential (primary) hypertension: Secondary | ICD-10-CM

## 2022-03-30 NOTE — Telephone Encounter (Signed)
Pt has appt on 2/19.

## 2022-04-07 ENCOUNTER — Telehealth: Payer: Self-pay | Admitting: Medical

## 2022-04-07 MED ORDER — POTASSIUM CHLORIDE ER 10 MEQ PO TBCR: 20.0000 meq | EXTENDED_RELEASE_TABLET | Freq: Every day | ORAL | 1 refills | Status: DC

## 2022-04-07 MED ORDER — METOPROLOL TARTRATE 25 MG PO TABS: 25.0000 mg | ORAL_TABLET | Freq: Two times a day (BID) | ORAL | 0 refills | Status: DC

## 2022-04-07 NOTE — Telephone Encounter (Signed)
Jessica Hart asks if you can call her about her letoprolo and potassium prescriptions.

## 2022-04-07 NOTE — Telephone Encounter (Signed)
Left message for pt to call me back 

## 2022-04-07 NOTE — Telephone Encounter (Signed)
Refilled meds for pt

## 2022-04-13 ENCOUNTER — Ambulatory Visit: Payer: PPO | Attending: Internal Medicine | Admitting: Internal Medicine

## 2022-04-13 ENCOUNTER — Encounter: Payer: Self-pay | Admitting: Internal Medicine

## 2022-04-13 VITALS — BP 117/72 | HR 68 | Ht 63.0 in | Wt 154.8 lb

## 2022-04-13 DIAGNOSIS — I2510 Atherosclerotic heart disease of native coronary artery without angina pectoris: Secondary | ICD-10-CM

## 2022-04-13 DIAGNOSIS — I272 Pulmonary hypertension, unspecified: Secondary | ICD-10-CM

## 2022-04-13 MED ORDER — FUROSEMIDE 20 MG PO TABS: 60.0000 mg | ORAL_TABLET | Freq: Every day | ORAL | 3 refills | Status: DC

## 2022-04-13 NOTE — Progress Notes (Signed)
Cardiology Office Note:    Date:  04/13/2022   ID:  ARGENTINA STADTLER, DOB 11/25/1940, MRN [id number]  PCP:  Carlena Hurl, PA-C  Cardiologist:  Elouise Munroe, MD  Electrophysiologist:  None   Referring MD: Carlena Hurl, PA-C   Chief Complaint/Reason for Referral: CAD s/p CABG, chronic respiratory failure and pulmonary HTN  History of Present Illness:    Jessica Hart is a 82 y.o. female with a history of diabetes mellitus type 2, hypertension, hyperlipidemia, GERD, remote tobacco abuse who presents for follow-up after CABG x3 LIMA to LAD, reverse saphenous vein graft to PDA and OM1.  She was initially referred for coronary angiography given progressive chest pain and infarct seen on stress test inferior and inferolateral with nuclear stress EF of 36%. Coronary angiography showed severe three-vessel coronary artery obstructive disease with severe three-vessel calcification. Coronary artery bypass grafting performed on 09/02/2019.  Subsequently diagnosed with pulmonary hypertension primarily group 3.  Today, she reports that she is feeling much better since her last visit with Dr. Silas Flood in pulmonary medicine and initiation of tadalafil uptitrated to 40 mg daily.  She still experiences shortness of breath with exertion.  Currently using 4 L of oxygen and does still endorse hypoxia. She is currently taking tadalafil 40 mg per day.  Plan per pulmonary is to potentially begin an ERA at her next visit.  This may continue to help her symptomatically.  She is having to use supplemental oxygen constantly and has been using 4 l/m. She is having some issues with epistaxis due to the dryness, but not significant. They did not give her humidified oxygen with her home supply. She is trying to keep her oxygen at 90% as recommended by pulmonary medicine.  She wants to get some energy back so she can spend more time with her grandchildren and great grandchildren.  She had a very close friend  who passed away 4 years ago and since then feels she has been more sedentary.  She states that they have been trying to get home healthcare, but is having some issues with her insurance.  Endorses mild pedal edema.  She does have a stationary bike that she has been trying to use and feels it may be helping.   We reviewed her medications to make sure that the instructions were clear.  She was previously prescribed metoprolol tartrate 37.5 mg twice daily, but her medication administration record suggests 25 mg twice daily.  She brings both bottles today.  Based on current vital signs and symptoms, reasonable to continue on metoprolol tartrate 25 mg twice daily.  She denies any palpitations or chest pain. No lightheadedness, headaches, syncope, orthopnea, or PND.      Past Medical History:  Diagnosis Date   Arthritis    Cataract bilaterl 3 years ago   Coronary artery disease    Diabetes mellitus 02/27/2005   Diabetes mellitus type 2, controlled, without complications ([place]) [id number]   Qualifier: Diagnosis of  By: Drucie Ip     Diverticulosis    Dyspnea    Echocardiogram findings abnormal, without diagnosis 2005   EF 47%, no ischemia, no infarction   Former smoker 01/10/2017   45 pack year history, quit in 2007    GERD (gastroesophageal reflux disease)    Heart murmur    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   History of bone density study 2006   Lt hip = -1.0, L spine = -1.8    Hyperlipidemia    Hypertension  Past Surgical History:  Procedure Laterality Date   ABDOMINAL HYSTERECTOMY  1989   one ovary remains per patient   CARDIAC CATHETERIZATION  08/20/2019   COLONOSCOPY     CORONARY ARTERY BYPASS GRAFT N/A 09/02/2019   Procedure: CORONARY ARTERY BYPASS GRAFTING (CABG) TIMES THREE USING LEFT INTERNAL MAMMARY ARTERY AND RIGHT GREATER SAPHENOUS VEIN;  Surgeon: Lajuana Matte, MD;  Location: [facility];  Service: Open Heart Surgery;  Laterality: N/A;   ENDOVEIN HARVEST OF GREATER  SAPHENOUS VEIN Right 09/02/2019   Procedure: ENDOVEIN HARVEST OF GREATER SAPHENOUS VEIN;  Surgeon: Lajuana Matte, MD;  Location: [facility];  Service: Open Heart Surgery;  Laterality: Right;   EYE SURGERY Bilateral    CATARACT   LEFT HEART CATH AND CORONARY ANGIOGRAPHY N/A 08/20/2019   Procedure: LEFT HEART CATH AND CORONARY ANGIOGRAPHY;  Surgeon: Belva Crome, MD;  Location: [facility];  Service: Cardiovascular;  Laterality: N/A;   RIGHT AND LEFT HEART CATH N/A 07/13/2021   Procedure: RIGHT AND LEFT HEART CATH;  Surgeon: Early Osmond, MD;  Location: [facility];  Service: Cardiovascular;  Laterality: N/A;   TEE WITHOUT CARDIOVERSION N/A 09/02/2019   Procedure: TRANSESOPHAGEAL ECHOCARDIOGRAM (TEE);  Surgeon: Lajuana Matte, MD;  Location: [facility];  Service: Open Heart Surgery;  Laterality: N/A;   TONSILECTOMY, ADENOIDECTOMY, BILATERAL MYRINGOTOMY AND TUBES  1965   TONSILLECTOMY      Current Medications: Current Meds  Medication Sig   acetaminophen (TYLENOL) 500 MG tablet Take 2 tablets (1,000 mg total) by mouth every 8 (eight) hours as needed.   aspirin EC 81 MG tablet Take 1 tablet (81 mg total) by mouth daily. Swallow whole.   Cholecalciferol (D3 5000) 125 MCG (5000 UT) capsule Take 5,000 Units by mouth daily.   CREON 36000-114000 units CPEP capsule TAKE 2 CAPSULES BY MOUTH 3 TIMES A DAY WITH MEALS AND TAKE 1 CAPSULE WITH EACH SNACK TWICE DAILY (Patient taking differently: Take 36,000 Units by mouth 3 (three) times daily with meals.)   dapagliflozin propanediol (FARXIGA) 5 MG TABS tablet Take 1 tablet (5 mg total) by mouth daily.   Fluticasone-Umeclidin-Vilant (TRELEGY ELLIPTA) 100-62.5-25 MCG/ACT AEPB Inhale 1 puff into the lungs daily. TAKE 1 PUFF BY MOUTH EVERY DAY Strength: 100-62.5-25 MCG/ACT   insulin degludec (TRESIBA FLEXTOUCH) 100 UNIT/ML FlexTouch Pen Inject 15 Units into the skin daily after supper.   losartan (COZAAR) 50 MG tablet TAKE 1 TABLET BY MOUTH  EVERY DAY   metFORMIN (GLUCOPHAGE-XR) 750 MG 24 hr tablet TAKE 1 TABLET BY MOUTH EVERY DAY WITH BREAKFAST   metoprolol tartrate (LOPRESSOR) 25 MG tablet Take 1 tablet (25 mg total) by mouth 2 (two) times daily. Take 25 mg by mouth 2 (two) times daily.   Misc. Devices MISC SIG: 1 L of Oxygen via Nasal cannula around the clock Dispense Home Oxygen and Portable tank and necessary supplies. Dx: Hypoxemia, Shortness of breath (Patient taking differently: Place 4-4.5 L into the nose See admin instructions. 4 - 4.5 L of Oxygen via Nasal cannula around the clock Dispense Home Oxygen and Portable tank and necessary supplies. Dx: Hypoxemia, Shortness of breath)   Multiple Vitamins-Minerals (NO IRON MULT VITAMIN-MINERALS) TABS TAKE 1 TABLET BY MOUTH DAILY (Patient taking differently: Take 1 tablet by mouth daily.)   omeprazole (PRILOSEC) 40 MG capsule Take 1 capsule (40 mg total) by mouth daily.   ONETOUCH VERIO test strip SMARTSIG:1 Each Via Meter Daily   potassium chloride (KLOR-CON) 10 MEQ tablet Take  2 tablets (20 mEq total) by mouth daily.   rosuvastatin (CRESTOR) 20 MG tablet TAKE 1 TABLET BY MOUTH EVERY DAY   tadalafil, PAH, (ADCIRCA) 20 MG tablet Take 1 tablet (20 mg total) by mouth daily for 30 days, THEN 2 tablets (40 mg total) daily.   vitamin B-12 (CYANOCOBALAMIN) 500 MCG tablet Take 500 mcg by mouth daily.   [DISCONTINUED] furosemide (LASIX) 20 MG tablet Take 3 tablets (60 mg total) by mouth daily.     Allergies:   Lisinopril   Social History   Tobacco Use   Smoking status: Former    Packs/day: 1.00    Years: 45.00    Total pack years: 45.00    Types: Cigarettes    Quit date: 07/27/2005    Years since quitting: 16.7   Smokeless tobacco: Former    Quit date: 03/2005  Vaping Use   Vaping Use: Never used  Substance Use Topics   Alcohol use: Not Currently    Comment: beer occasionally    Drug use: No     Family History: The patient's family history includes Cancer in her sister;  Heart disease in her father; Tongue cancer in her sister. There is no history of Colon polyps, Rectal cancer, or Stomach cancer.  ROS:   Please see the history of present illness.     (+) shortness of breathe  (+) fatigue (+) bilateral LE edema (feet)  All other systems reviewed and are negative.  EKGs/Labs/Other Studies Reviewed:    The following studies were reviewed today:  Upper Venous Doppler 07/14/21:  Summary:     Right:  There is no DVT noted in the right IJV or subclavian vein. 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 Unable to locate the neck of the pseudoaneurysm secondary to breathing and movement.     Left:  No DVT noted in the IJV or the subclavian vein.   Right and Left Heart Cath 07/13/21:  Conclusion:  Right heart catheterization with pulmonary arterial hypertension with a mean pulmonary artery pressure of 44 mmHg and a pulmonary vascular resistance of approximately 10 Wood units.   Chest CT 05/19/21 1. The appearance of the lungs is compatible with interstitial lung disease, with a spectrum of findings categorized as probable usual interstitial pneumonia (UIP) per current ATS guidelines. Outpatient referral to Pulmonology for further clinical evaluation is recommended. 2. Mild dilatation of the pulmonic trunk (3.4 cm in diameter), concerning for associated pulmonary arterial hypertension. 3. Aortic atherosclerosis, in addition to left main and three-vessel coronary artery disease. Status post median sternotomy for CABG including LIMA to the LAD. 4. Cardiomegaly with right atrial and right ventricular dilatation. 5. Mild paraseptal emphysema.   Aortic Atherosclerosis (ICD10-I70.0) and Emphysema (ICD10-J43.9).  Echo 03/08/21  1. Left ventricular ejection fraction, by estimation, is 65 to 70%. The left ventricle has normal function. The left ventricle has no regional wall  motion abnormalities. There is moderate left ventricular hypertrophy.  Left ventricular diastolic parameters are consistent with Grade I diastolic dysfunction (impaired relaxation). Elevated left atrial pressure. There is the interventricular septum is flattened in systole and diastole, consistent with right  ventricular pressure and volume overload. The average left ventricular global longitudinal strain is -18.2 %. The global longitudinal strain is normal.   2. Right ventricular systolic function is mildly reduced. The right ventricular size is moderately enlarged. There is severely elevated pulmonary artery systolic pressure. The estimated  right ventricular systolic pressure is 123XX123 mmHg.   3. Right atrial size was mildly dilated.   4. The mitral valve is normal in structure. Trivial mitral valve  regurgitation. No evidence of mitral stenosis. Moderate mitral annular calcification.   5. Tricuspid valve regurgitation is moderate.   6. The aortic valve is calcified. There is moderate calcification of the aortic valve. There is severe thickening of the aortic valve. Aortic valve regurgitation is not visualized. Aortic valve sclerosis/calcification is present, without any evidence of  aortic stenosis.   7. The inferior vena cava is normal in size with greater than 50% respiratory variability, suggesting right atrial pressure of 3 mmHg.   EKG:  EKG is personally reviewed  04/13/2022: NSR,  rightward axis, inferior and anterior infarct.  11/09/21: No EKG ordered 05/24/21 - NSR, inferior and anterior infarct pattern. 11/04/20 - NSR, inferior and anterior infarct pattern. 05/04/20 - NSR, inferior and anterior infarct pattern.   Recent Labs: 07/13/2021: ALT 21 09/29/2021: Hemoglobin 15.3; Magnesium 2.2; Platelets 210 10/10/2021: BNP 833.9; BUN 22; Creatinine, Ser 1.46; Potassium 4.4; Sodium 138 03/06/2022: Pro B Natriuretic peptide (BNP) 1,156.0  Recent Lipid Panel    Component Value Date/Time   CHOL 129  05/11/2020 0850   TRIG 133 05/11/2020 0850   HDL 40 05/11/2020 0850   CHOLHDL 3.2 05/11/2020 0850   CHOLHDL 4.4 08/29/2016 0920   VLDL 28 08/29/2016 0920   LDLCALC 65 05/11/2020 0850   LDLDIRECT 81 03/21/2013 1431    Physical Exam:    VS:  BP 117/72   Pulse 68   Ht 5' 3"$  (1.6 m)   Wt 154 lb 12.8 oz (70.2 kg)   SpO2 90%   BMI 27.42 kg/m     Wt Readings from Last 5 Encounters:  04/13/22 154 lb 12.8 oz (70.2 kg)  03/21/22 150 lb (68 kg)  03/06/22 151 lb 12.8 oz (68.9 kg)  12/21/21 154 lb (69.9 kg)  11/09/21 154 lb 3.2 oz (69.9 kg)    Constitutional: No acute distress Eyes: sclera non-icteric, normal conjunctiva and lids ENMT: normal dentition, moist mucous membranes Cardiovascular: regular rhythm, normal rate, 2/6 systolic murmur. JVD is at the upper 1/3 of the neck, sitting upright.  Respiratory: clear to auscultation bilaterally, using supplemental oxygen GI : normal bowel sounds, soft and nontender. No distention.   MSK: extremities warm, well perfused. 1+ edema in the lower extremities  NEURO: grossly nonfocal exam, moves all extremities. PSYCH: alert and oriented x 3, normal mood and affect.   ASSESSMENT:    1. Coronary artery disease involving native heart without angina pectoris, unspecified vessel or lesion type   2. Pulmonary HTN (HCC)     PLAN:    PHTN Emphysema Dyspnea on exertion Hypoxia  - NYHA II today.  -likely driven by emphysema and group 3 PHTN. Diuresing with lasix 60 mg daily orally, stable dose with minimal lower extremity edema. Continue to follow with pulmonary for chronic hypoxemic respiratory failure.  - no osa on sleep study. Hypoxia noted.  - Right heart catheterization with pulmonary arterial hypertension with a mean pulmonary artery pressure of 44 mmHg and a pulmonary vascular resistance of approximately 10 Wood units. - seen by Dr. Haroldine Laws AHF and Dr. Silas Flood in pulmonary medicine.  Currently on tadalafil 40 mg daily, tolerating well  with improvement in dyspnea. -Will repeat echocardiogram given improvement in symptoms, 1 year since prior.  Coronary artery disease involving native heart without angina pectoris, unspecified vessel or lesion type S/P CABG x  3 - continue ASA 81 mg daily - continue metoprolol tartrate 25 mg BID - continue rosuvastatin 20 mg daily.  - no chest pain   Essential hypertension - Plan: EKG 12-Lead - BP well controlled.  - continue losartan 50 mg daily - continue metoprolol tartrate 25 mg BID - lasix as above.  Hyperlipidemia associated with type 2 diabetes mellitus ([place])  - continue crestor 20 mg daily. Offered [id number] previously and she defered.  Type 2 diabetes mellitus with hyperglycemia, with long-term current use of insulin (HCC) - she is on Farxiga, insulin, metformin per PCP  Follow up: 6 months  Total time of encounter: 30 minutes total time of encounter, including 20 minutes spent in face-to-face patient care on the date of this encounter. This time includes coordination of care and counseling regarding above mentioned problem list. Remainder of non-face-to-face time involved reviewing chart documents/testing relevant to the patient encounter and documentation in the medical record. I have independently reviewed documentation from referring provider.   Cherlynn Kaiser, MD, [facility]   Medication Adjustments/Labs and Tests Ordered: Current medicines are reviewed at length with the patient today.  Concerns regarding medicines are outlined above.   Orders Placed This Encounter  Procedures   EKG 12-Lead   ECHOCARDIOGRAM COMPLETE     Meds ordered this encounter  Medications   furosemide (LASIX) 20 MG tablet    Sig: Take 3 tablets (60 mg total) by mouth daily.    Dispense:  90 tablet    Refill:  3     Patient Instructions  Medication Instructions:  No Changes In Medications at this time.  *If you need a refill on your cardiac medications before  your next appointment, please call your pharmacy*  Lab Work: None Ordered At This Time.  If you have labs (blood work) drawn today and your tests are completely normal, you will receive your results only by: [place] (if you have MyChart) OR A paper copy in the mail If you have any lab test that is abnormal or we need to change your treatment, we will call you to review the results.  Testing/Procedures: Your physician has requested that you have an echocardiogram. Echocardiography is a painless test that uses sound waves to create images of your heart. It provides your doctor with information about the size and shape of your heart and how well your heart's chambers and valves are working. You may receive an ultrasound enhancing agent through an IV if needed to better visualize your heart during the echo.This procedure takes approximately one hour. There are no restrictions for this procedure. This will take place at the [address], [address].   Follow-Up: At [facility], you and your health needs are our priority.  As part of our continuing mission to provide you with exceptional heart care, we have created designated Provider Care Teams.  These Care Teams include your primary Cardiologist (physician) and Advanced Practice Providers (APPs -  Physician Assistants and Nurse Practitioners) who all work together to provide you with the care you need, when you need it.  Your next appointment:   6 month(s)  Provider:   Elouise Munroe, MD       Alphonzo Grieve as a scribe for Elouise Munroe, MD.,have documented all relevant documentation on the behalf of Elouise Munroe, MD,as directed by  Elouise Munroe, MD while in the presence of Elouise Munroe, MD.   I, Elouise Munroe,  MD, have reviewed all documentation for this visit. The documentation on 04/13/22 for the exam, diagnosis, procedures, and orders are all accurate and complete.

## 2022-04-13 NOTE — Patient Instructions (Signed)
Medication Instructions:  No Changes In Medications at this time.  *If you need a refill on your cardiac medications before your next appointment, please call your pharmacy*  Lab Work: None Ordered At This Time.  If you have labs (blood work) drawn today and your tests are completely normal, you will receive your results only by: [place] (if you have MyChart) OR A paper copy in the mail If you have any lab test that is abnormal or we need to change your treatment, we will call you to review the results.  Testing/Procedures: Your physician has requested that you have an echocardiogram. Echocardiography is a painless test that uses sound waves to create images of your heart. It provides your doctor with information about the size and shape of your heart and how well your heart's chambers and valves are working. You may receive an ultrasound enhancing agent through an IV if needed to better visualize your heart during the echo.This procedure takes approximately one hour. There are no restrictions for this procedure. This will take place at the [address].   Follow-Up: At [facility], you and your health needs are our priority.  As part of our continuing mission to provide you with exceptional heart care, we have created designated Provider Care Teams.  These Care Teams include your primary Cardiologist (physician) and Advanced Practice Providers (APPs -  Physician Assistants and Nurse Practitioners) who all work together to provide you with the care you need, when you need it.  Your next appointment:   6 month(s)  Provider:   Elouise Munroe, MD

## 2022-04-17 ENCOUNTER — Ambulatory Visit (INDEPENDENT_AMBULATORY_CARE_PROVIDER_SITE_OTHER): Payer: PPO | Admitting: Medical

## 2022-04-17 ENCOUNTER — Other Ambulatory Visit: Payer: Self-pay | Admitting: Pulmonary Disease

## 2022-04-17 ENCOUNTER — Encounter: Payer: Self-pay | Admitting: Medical

## 2022-04-17 VITALS — BP 122/80 | HR 80 | Temp 97.9°F | Resp 18 | Wt 153.0 lb

## 2022-04-17 DIAGNOSIS — I2721 Secondary pulmonary arterial hypertension: Secondary | ICD-10-CM

## 2022-04-17 DIAGNOSIS — N898 Other specified noninflammatory disorders of vagina: Secondary | ICD-10-CM

## 2022-04-17 DIAGNOSIS — E785 Hyperlipidemia, unspecified: Secondary | ICD-10-CM

## 2022-04-17 DIAGNOSIS — E1159 Type 2 diabetes mellitus with other circulatory complications: Secondary | ICD-10-CM | POA: Diagnosis not present

## 2022-04-17 DIAGNOSIS — E1169 Type 2 diabetes mellitus with other specified complication: Secondary | ICD-10-CM | POA: Diagnosis not present

## 2022-04-17 DIAGNOSIS — E1165 Type 2 diabetes mellitus with hyperglycemia: Secondary | ICD-10-CM | POA: Diagnosis not present

## 2022-04-17 DIAGNOSIS — Z951 Presence of aortocoronary bypass graft: Secondary | ICD-10-CM | POA: Diagnosis not present

## 2022-04-17 DIAGNOSIS — L84 Corns and callosities: Secondary | ICD-10-CM | POA: Diagnosis not present

## 2022-04-17 DIAGNOSIS — I152 Hypertension secondary to endocrine disorders: Secondary | ICD-10-CM

## 2022-04-17 DIAGNOSIS — E119 Type 2 diabetes mellitus without complications: Secondary | ICD-10-CM

## 2022-04-17 DIAGNOSIS — I2510 Atherosclerotic heart disease of native coronary artery without angina pectoris: Secondary | ICD-10-CM | POA: Diagnosis not present

## 2022-04-17 DIAGNOSIS — I5032 Chronic diastolic (congestive) heart failure: Secondary | ICD-10-CM

## 2022-04-17 DIAGNOSIS — Z794 Long term (current) use of insulin: Secondary | ICD-10-CM

## 2022-04-17 DIAGNOSIS — Q845 Enlarged and hypertrophic nails: Secondary | ICD-10-CM | POA: Diagnosis not present

## 2022-04-17 DIAGNOSIS — Z79899 Other long term (current) drug therapy: Secondary | ICD-10-CM

## 2022-04-17 LAB — POCT URINALYSIS DIP (PROADVANTAGE DEVICE)
Bilirubin, UA: NEGATIVE
Blood, UA: NEGATIVE
Glucose, UA: 500 mg/dL — AB
Ketones, POC UA: NEGATIVE mg/dL
Leukocytes, UA: NEGATIVE
Nitrite, UA: NEGATIVE
Protein Ur, POC: NEGATIVE mg/dL
Specific Gravity, Urine: 1.005
Urobilinogen, Ur: 0.2
pH, UA: 6 (ref 5.0–8.0)

## 2022-04-17 NOTE — Progress Notes (Unsigned)
Subjective:  Jessica Hart is a 82 y.o. female who presents for Chief Complaint  Patient presents with   Follow-up    Medication recheck.      Medical team: Dr. Glori Bickers, heart failure clinic Dr. Larey Days, pulmonology Dr. Cherlynn Kaiser, cardiology Dr. Rutherford Guys, ophthalmology Dr. Hulan Saas, ortho Dr. Harl Bowie, GI Dr. Radene Journey, ENT Tzipporah Nagorski, Camelia Eng, PA-C here with primary care  Her medical history includes coronary artery disease, status post CABG, pulmonary artery hypertension, chronic respiratory failure with hypoxia, on oxygen therapy, diabetes, pancreatic insufficiency, chronic heart failure, hypertension, hyperlipidemia, former smoker, atherosclerosis.  Here for chronic disease follow-up  Diabetes-compliant with Farxiga 5 mg daily, metformin 750 mg XR daily, Tresiba 15 units daily.  Not checking sugars too often.  No recent concerning blood sugar readings though  She has questions about her feet, has an area of this somewhat thickened callus but she keeps it flat.  She also has some thickened toenails.  Hyperlipidemia-compliant with Crestor 20 mg daily and aspirin 81 mg daily.  However she is nonfasting today  Pulmonary hypertension, hypertension -  she continues to see pulmonology and cardiology and saw them recently  She has a CT lung cancer screening coming up in March 2024  She has concerns about possible rash or irritation in the vulvar region.  She asked for refill Lotrisone cream that has been used prior.  No vaginal discharge, no dysuria.  She feels like the vulva has some swelling in general  She declines Tdap and Shingrix vaccines   No other aggravating or relieving factors.    No other c/o.  Past Medical History:  Diagnosis Date   Arthritis    Cataract bilaterl 3 years ago   Coronary artery disease    Diabetes mellitus 02/27/2005   Diabetes mellitus type 2, controlled, without complications ([place]) 99991111    Qualifier: Diagnosis of  By: Drucie Ip     Diverticulosis    Dyspnea    Echocardiogram findings abnormal, without diagnosis 2005   EF 47%, no ischemia, no infarction   Former smoker 01/10/2017   45 pack year history, quit in 2007    GERD (gastroesophageal reflux disease)    Heart murmur    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   History of bone density study 2006   Lt hip = -1.0, L spine = -1.8    Hyperlipidemia    Hypertension    Current Outpatient Medications on File Prior to Visit  Medication Sig Dispense Refill   acetaminophen (TYLENOL) 500 MG tablet Take 2 tablets (1,000 mg total) by mouth every 8 (eight) hours as needed. 30 tablet 2   aspirin EC 81 MG tablet Take 1 tablet (81 mg total) by mouth daily. Swallow whole. 90 tablet 3   Cholecalciferol (D3 5000) 125 MCG (5000 UT) capsule Take 5,000 Units by mouth daily.     CREON 36000-114000 units CPEP capsule TAKE 2 CAPSULES BY MOUTH 3 TIMES A DAY WITH MEALS AND TAKE 1 CAPSULE WITH EACH SNACK TWICE DAILY (Patient taking differently: Take 36,000 Units by mouth 3 (three) times daily with meals.) 240 capsule 11   dapagliflozin propanediol (FARXIGA) 5 MG TABS tablet Take 1 tablet (5 mg total) by mouth daily. 90 tablet 1   Fluticasone-Umeclidin-Vilant (TRELEGY ELLIPTA) 100-62.5-25 MCG/ACT AEPB Inhale 1 puff into the lungs daily. TAKE 1 PUFF BY MOUTH EVERY DAY Strength: 100-62.5-25 MCG/ACT 180 each 3   furosemide (LASIX) 20 MG tablet Take 3 tablets (60 mg total) by mouth  daily. 90 tablet 3   insulin degludec (TRESIBA FLEXTOUCH) 100 UNIT/ML FlexTouch Pen Inject 15 Units into the skin daily after supper. 3 mL 1   losartan (COZAAR) 50 MG tablet TAKE 1 TABLET BY MOUTH EVERY DAY 90 tablet 0   metFORMIN (GLUCOPHAGE-XR) 750 MG 24 hr tablet TAKE 1 TABLET BY MOUTH EVERY DAY WITH BREAKFAST 90 tablet 1   metoprolol tartrate (LOPRESSOR) 25 MG tablet Take 1 tablet (25 mg total) by mouth 2 (two) times daily. Take 25 mg by mouth 2 (two) times daily. 180 tablet 0    Misc. Devices MISC SIG: 1 L of Oxygen via Nasal cannula around the clock Dispense Home Oxygen and Portable tank and necessary supplies. Dx: Hypoxemia, Shortness of breath (Patient taking differently: Place 4-4.5 L into the nose See admin instructions. 4 - 4.5 L of Oxygen via Nasal cannula around the clock Dispense Home Oxygen and Portable tank and necessary supplies. Dx: Hypoxemia, Shortness of breath) 1 Device 0   Multiple Vitamins-Minerals (NO IRON MULT VITAMIN-MINERALS) TABS TAKE 1 TABLET BY MOUTH DAILY (Patient taking differently: Take 1 tablet by mouth daily.) 30 tablet 0   omeprazole (PRILOSEC) 40 MG capsule Take 1 capsule (40 mg total) by mouth daily. 90 capsule 1   ONETOUCH VERIO test strip SMARTSIG:1 Each Via Meter Daily     potassium chloride (KLOR-CON) 10 MEQ tablet Take 2 tablets (20 mEq total) by mouth daily. 180 tablet 1   rosuvastatin (CRESTOR) 20 MG tablet TAKE 1 TABLET BY MOUTH EVERY DAY 90 tablet 0   vitamin B-12 (CYANOCOBALAMIN) 500 MCG tablet Take 500 mcg by mouth daily.     No current facility-administered medications on file prior to visit.    The following portions of the patient's history were reviewed and updated as appropriate: allergies, current medications, past family history, past medical history, past social history, past surgical history and problem list.  ROS Otherwise as in subjective above    Objective: BP 122/80   Pulse 80   Temp 97.9 F (36.6 C) (Oral)   Resp 18   Wt 153 lb (69.4 kg)   SpO2 94% Comment: 4L 02 nasal canula  BMI 27.10 kg/m   General appearance: alert, no distress, well developed, well nourished seated, nasal cannula oxygen in place, African-American female Neck: supple, no lymphadenopathy, no thyromegaly, no masses, mild JVD noted Heart: RRR, normal S1, S2, no murmurs Lungs: Decreased breath sounds in general,, no wheezes, rhonchi, or rales Abdomen: +bs, soft, non tender, non distended, no masses, no hepatomegaly, no  splenomegaly Pulses: 2+ radial pulses, 1+ pedal pulses, normal cap refill Ext: no edema Pelvic exam: External exam only no speculum exam done, she has scattered areas of hypopigmentation, obvious atrophic changes of the vagina, small external hemorrhoid on the right, no other obvious lesions.  Exam chaperoned by nurse.  Diabetic Foot Exam - Simple   Simple Foot Form Diabetic Foot exam was performed with the following findings: Yes 04/17/2022  5:19 PM  Visual Inspection See comments: Yes Sensation Testing Intact to touch and monofilament testing bilaterally: Yes Pulse Check See comments: Yes Comments 1+ pedal pulses, left volar foot with an area of plantar wart over the second MTP area in a callus on the lateral foot over the fifth MTP hypertrophic toenails bilateral feet      Assessment: Encounter Diagnoses  Name Primary?   Hypertension associated with diabetes ([place]) Yes   Type 2 diabetes mellitus with hyperglycemia, unspecified whether long term insulin use ([place])  Hyperlipidemia associated with type 2 diabetes mellitus (HCC)    Insulin dependent type 2 diabetes mellitus ([place])    Pulmonary artery hypertension (HCC)    High risk medication use    S/P CABG x 3    Vaginal dryness    Enlarged and hypertrophic nails    Corns and callosities    Chronic diastolic congestive heart failure (HCC)    Coronary artery disease involving native coronary artery of native heart without angina pectoris      Plan: Diabetes Updated labs today Continue current medication, metformin XR 750 mg daily, Farxiga 5 mg daily, and Tresiba 15 units daily Needs to be checking glucoses regularly  Hyperlipidemia continue aspirin and Crestor 20 mg daily Return for fasting lipid panel soon as she is past due.  Prior visits she has not been fasting   Hypertrophic nails, corns and calluses of foot-referral to podiatry  Vaginal dryness, skin changes in the vulvar area-***   Chronic heart failure,  pulmonary hypertension, coronary disease, atherosclerosis Continue follow-up with pulmonology and cardiology I reviewed recent pulmonology and cardiology notes She is compliant with oxygen therapy and medication  Per cardiology notes 04/13/22: PHTN Emphysema Dyspnea on exertion Hypoxia  - NYHA II  -likely driven by emphysema and group 3 PHTN. Diuresing with lasix 60 mg daily orally, stable dose with minimal lower extremity edema. Continue to follow with pulmonary for chronic hypoxemic respiratory failure.  - no osa on sleep study. Hypoxia noted.  - Right heart catheterization with pulmonary arterial hypertension with a mean pulmonary artery pressure of 44 mmHg and a pulmonary vascular resistance of approximately 10 [place] units. - seen by Dr. Haroldine Laws AHF and Dr. Silas Flood in pulmonary medicine.  Currently on tadalafil 40 mg daily, tolerating well with improvement in dyspnea. -Will repeat echocardiogram given improvement in symptoms, 1 year since prior. Pending echo currently.  Coronary artery disease involving native heart without angina pectoris, unspecified vessel or lesion type S/P CABG x 3 - continue ASA 81 mg daily - continue metoprolol tartrate 25 mg BID - continue rosuvastatin 20 mg daily.    Essential hypertension  - continue losartan 50 mg daily - continue metoprolol tartrate 25 mg BID -  continue lasix     Jessica Hart was seen today for follow-up.  Diagnoses and all orders for this visit:  Hypertension associated with diabetes ([place])  Type 2 diabetes mellitus with hyperglycemia, unspecified whether long term insulin use (HCC) -     Hemoglobin A1c -     Microalbumin/Creatinine Ratio, Urine -     POCT Urinalysis DIP (Proadvantage Device) -     Ambulatory referral to Podiatry  Hyperlipidemia associated with type 2 diabetes mellitus ([place]) -     Lipid panel; Future -     Cancel: Lipid panel; Future  Insulin dependent type 2 diabetes mellitus ([place])  Pulmonary artery  hypertension (HCC)  High risk medication use  S/P CABG x 3  Vaginal dryness  Enlarged and hypertrophic nails -     Ambulatory referral to Podiatry  Corns and callosities -     Ambulatory referral to Podiatry  Chronic diastolic congestive heart failure ([place])  Coronary artery disease involving native coronary artery of native heart without angina pectoris    Follow up: soon for fasting lipid panel

## 2022-04-18 ENCOUNTER — Other Ambulatory Visit: Payer: Self-pay | Admitting: Medical

## 2022-04-18 DIAGNOSIS — Z794 Long term (current) use of insulin: Secondary | ICD-10-CM

## 2022-04-18 MED ORDER — ESTRADIOL 0.1 MG/GM VA CREA: TOPICAL_CREAM | VAGINAL | 2 refills | Status: DC

## 2022-04-18 MED ORDER — TRESIBA FLEXTOUCH 100 UNIT/ML ~~LOC~~ SOPN: 15.0000 [IU] | PEN_INJECTOR | Freq: Every day | SUBCUTANEOUS | 2 refills | Status: DC

## 2022-04-18 MED ORDER — ASPIRIN 81 MG PO TBEC: 81.0000 mg | DELAYED_RELEASE_TABLET | Freq: Every day | ORAL | 3 refills | Status: AC

## 2022-04-18 MED ORDER — DAPAGLIFLOZIN PROPANEDIOL 5 MG PO TABS: 5.0000 mg | ORAL_TABLET | Freq: Every day | ORAL | 3 refills | Status: DC

## 2022-04-18 NOTE — Progress Notes (Signed)
Microalbumin kidney marker still pending, rest of labs are stable.  Begin Estrace vaginal cream pea-sized amount twice weekly to help with current symptoms  Continue rest of medicines as usual  Follow-up with cardiology and pulmonology as planned

## 2022-04-19 ENCOUNTER — Telehealth: Payer: Self-pay | Admitting: Pulmonary Disease

## 2022-04-19 NOTE — Telephone Encounter (Signed)
Patient states her insurance used to be [employer] but now has [employer] and [employer] states they do not deal with [employer] and they want to pick up the oxygen equipment. Patient was given Gerald [facility] at [facility] number: [phone] ext [phone]. Please advise what company patient can get oxygen from with [employer]

## 2022-04-20 ENCOUNTER — Ambulatory Visit (INDEPENDENT_AMBULATORY_CARE_PROVIDER_SITE_OTHER): Payer: PPO | Admitting: Podiatry

## 2022-04-20 DIAGNOSIS — B351 Tinea unguium: Secondary | ICD-10-CM | POA: Diagnosis not present

## 2022-04-20 DIAGNOSIS — M79675 Pain in left toe(s): Secondary | ICD-10-CM | POA: Diagnosis not present

## 2022-04-20 DIAGNOSIS — M79674 Pain in right toe(s): Secondary | ICD-10-CM | POA: Diagnosis not present

## 2022-04-20 DIAGNOSIS — L84 Corns and callosities: Secondary | ICD-10-CM

## 2022-04-20 DIAGNOSIS — E1142 Type 2 diabetes mellitus with diabetic polyneuropathy: Secondary | ICD-10-CM

## 2022-04-20 NOTE — Telephone Encounter (Signed)
Yes, please.

## 2022-04-20 NOTE — Telephone Encounter (Signed)
Can we get a new order placed and just put somewhere on the order change due to insurance hopefully that will help [place]

## 2022-04-20 NOTE — Progress Notes (Signed)
  Subjective:  Patient ID: Jessica Hart, female    DOB: [birth year],  MRN: [id number]  Chief Complaint  Patient presents with   Diabetes    DFC,Nail trim, callus debridement left foot Referred by PCP for foot evaluation.     82 y.o. female presents with the above complaint. History confirmed with patient. Patient presenting with pain related to dystrophic thickened elongated nails. Patient is unable to trim own nails related to nail dystrophy and/or mobility issues. Patient does have a history of T2DM. Patient does have callus present located at the left plantar foot sub 2nd and 5th met head causing pain.  Patient does have some level of numbness in the feet however she says it is not pronounced.  She does take insulin.    Objective:  Physical Exam: warm, good capillary refill nail exam onychomycosis of the toenails, onycholysis, and dystrophic nails protective sensation absent DP pulses are very weakly palpable nonpalpable deep PT pulses cap refill intact all toes. Left Foot:  Pain with palpation of nails due to elongation and dystrophic growth.  Right Foot: Pain with palpation of nails due to elongation and dystrophic growth.   Assessment:   1. Pain due to onychomycosis of toenails of both feet   2. Pre-ulcerative calluses   3. DM type 2 with diabetic peripheral neuropathy ([place])      Plan:  Patient was evaluated and treated and all questions answered.  # Diabetes type 2 with peripheral neuropathy and peripheral arterial disease Patient educated on diabetes. Discussed proper diabetic foot care and discussed risks and complications of disease. Educated patient in depth on reasons to return to the office immediately should he/she discover anything concerning or new on the feet. All questions answered. Discussed proper shoes as well.    #Hyperkeratotic lesions/pre ulcerative calluses present subsecond metatarsal head and subfifth metatarsal head on the left foot All symptomatic  hyperkeratoses x 2 separate lesions were safely debrided with a sterile #10 blade to patient's level of comfort without incident. We discussed preventative and palliative care of these lesions including supportive and accommodative shoegear, padding, prefabricated and custom molded accommodative orthoses, use of a pumice stone and lotions/creams daily.  #Onychomycosis with pain  -Nails palliatively debrided as below. -Educated on self-care  Procedure: Nail Debridement Rationale: Pain Type of Debridement: manual, sharp debridement. Instrumentation: Nail nipper, rotary burr. Number of Nails: 10  Return in about 3 months (around 07/19/2022) for [facility].         Everitt Amber, DPM [facility]

## 2022-04-20 NOTE — Telephone Encounter (Signed)
Dr. Silas Flood may we placed an order for pt to have O2 at 4 L/m (per last AVS) r/t insurance change?

## 2022-04-20 NOTE — Telephone Encounter (Signed)
PCC's please advise. Thanks 

## 2022-04-21 ENCOUNTER — Other Ambulatory Visit: Payer: Self-pay

## 2022-04-21 DIAGNOSIS — J439 Emphysema, unspecified: Secondary | ICD-10-CM

## 2022-04-21 LAB — HEMOGLOBIN A1C
Est. average glucose Bld gHb Est-mCnc: 151 mg/dL
Hgb A1c MFr Bld: 6.9 % — ABNORMAL HIGH (ref 4.8–5.6)

## 2022-04-21 LAB — MICROALBUMIN / CREATININE URINE RATIO
Creatinine, Urine: 17.6 mg/dL
Microalb/Creat Ratio: 96 mg/g creat — ABNORMAL HIGH (ref 0–29)
Microalbumin, Urine: 16.9 ug/mL

## 2022-04-21 NOTE — Telephone Encounter (Signed)
New order has been placed for 02

## 2022-04-22 ENCOUNTER — Other Ambulatory Visit: Payer: Self-pay | Admitting: Medical

## 2022-04-22 ENCOUNTER — Other Ambulatory Visit: Payer: Self-pay | Admitting: Internal Medicine

## 2022-04-22 DIAGNOSIS — K219 Gastro-esophageal reflux disease without esophagitis: Secondary | ICD-10-CM

## 2022-04-22 DIAGNOSIS — E1165 Type 2 diabetes mellitus with hyperglycemia: Secondary | ICD-10-CM

## 2022-04-24 NOTE — Addendum Note (Signed)
Addended by: Meredith Staggers A on: 04/24/2022 10:19 AM   Modules accepted: Orders

## 2022-04-25 ENCOUNTER — Telehealth: Payer: Self-pay | Admitting: Pulmonary Disease

## 2022-04-25 NOTE — Telephone Encounter (Signed)
[employer] calling req for Pt O2 Stats

## 2022-04-25 NOTE — Telephone Encounter (Signed)
Called and left Jessica Hart a voicemail of patients o2 stats in office last month. Told her to call office back with any more questions or concerns.

## 2022-04-27 DIAGNOSIS — I2699 Other pulmonary embolism without acute cor pulmonale: Secondary | ICD-10-CM | POA: Diagnosis not present

## 2022-05-04 DIAGNOSIS — H524 Presbyopia: Secondary | ICD-10-CM | POA: Diagnosis not present

## 2022-05-04 DIAGNOSIS — Z961 Presence of intraocular lens: Secondary | ICD-10-CM | POA: Diagnosis not present

## 2022-05-04 DIAGNOSIS — E119 Type 2 diabetes mellitus without complications: Secondary | ICD-10-CM | POA: Diagnosis not present

## 2022-05-04 LAB — HM DIABETES EYE EXAM

## 2022-05-05 ENCOUNTER — Encounter: Payer: Self-pay | Admitting: Internal Medicine

## 2022-05-16 ENCOUNTER — Ambulatory Visit (HOSPITAL_COMMUNITY): Payer: PPO | Attending: Internal Medicine

## 2022-05-16 DIAGNOSIS — I2510 Atherosclerotic heart disease of native coronary artery without angina pectoris: Secondary | ICD-10-CM | POA: Insufficient documentation

## 2022-05-16 DIAGNOSIS — I272 Pulmonary hypertension, unspecified: Secondary | ICD-10-CM | POA: Diagnosis not present

## 2022-05-16 LAB — ECHOCARDIOGRAM COMPLETE
Area-P 1/2: 3.85 cm2
S' Lateral: 1.7 cm

## 2022-05-19 ENCOUNTER — Other Ambulatory Visit: Payer: PPO

## 2022-05-19 DIAGNOSIS — E1169 Type 2 diabetes mellitus with other specified complication: Secondary | ICD-10-CM

## 2022-05-19 DIAGNOSIS — E785 Hyperlipidemia, unspecified: Secondary | ICD-10-CM | POA: Diagnosis not present

## 2022-05-19 LAB — LIPID PANEL
Chol/HDL Ratio: 2.6 ratio (ref 0.0–4.4)
Cholesterol, Total: 97 mg/dL — ABNORMAL LOW (ref 100–199)
HDL: 37 mg/dL — ABNORMAL LOW (ref >39)
LDL Chol Calc (NIH): 44 mg/dL (ref 0–99)
Triglycerides: 79 mg/dL (ref 0–149)
VLDL Cholesterol Cal: 16 mg/dL (ref 5–40)

## 2022-05-22 ENCOUNTER — Ambulatory Visit
Admission: RE | Admit: 2022-05-22 | Discharge: 2022-05-22 | Disposition: A | Discharge status: Home / Self Care | Payer: PPO | Source: Ambulatory Visit | Attending: Medical | Admitting: Medical

## 2022-05-22 DIAGNOSIS — Z122 Encounter for screening for malignant neoplasm of respiratory organs: Secondary | ICD-10-CM

## 2022-05-22 DIAGNOSIS — Z87891 Personal history of nicotine dependence: Secondary | ICD-10-CM | POA: Diagnosis not present

## 2022-05-26 DIAGNOSIS — I2699 Other pulmonary embolism without acute cor pulmonale: Secondary | ICD-10-CM | POA: Diagnosis not present

## 2022-05-30 NOTE — Progress Notes (Signed)
Recent cholesterol labs shows LDL is okay.  Continue current rosuvastatin Crestor cholesterol medication  Eat plain fruits and vegetables daily.    Eat less cheese and meat in general

## 2022-05-31 ENCOUNTER — Telehealth: Payer: Self-pay | Admitting: Internal Medicine

## 2022-05-31 NOTE — Telephone Encounter (Signed)
Pt was notified of results

## 2022-05-31 NOTE — Telephone Encounter (Signed)
-----   Message from Carlena Hurl, PA-C sent at 05/30/2022  4:23 PM EDT ----- CT scan screen thankfully shows no lung cancer   ----- Message ----- From: Joella Prince, RN Sent: 05/24/2022   8:13 AM EDT To: Carlena Hurl, PA-C

## 2022-06-26 DIAGNOSIS — I2699 Other pulmonary embolism without acute cor pulmonale: Secondary | ICD-10-CM | POA: Diagnosis not present

## 2022-07-05 ENCOUNTER — Other Ambulatory Visit: Payer: Self-pay | Admitting: Medical

## 2022-07-07 ENCOUNTER — Telehealth: Payer: Self-pay | Admitting: Internal Medicine

## 2022-07-07 DIAGNOSIS — I10 Essential (primary) hypertension: Secondary | ICD-10-CM

## 2022-07-07 DIAGNOSIS — Z794 Long term (current) use of insulin: Secondary | ICD-10-CM

## 2022-07-07 MED ORDER — LOSARTAN POTASSIUM 50 MG PO TABS
50.0000 mg | ORAL_TABLET | Freq: Every day | ORAL | 1 refills | Status: DC
Start: 2022-07-07 — End: 2023-02-26

## 2022-07-07 MED ORDER — METOPROLOL TARTRATE 25 MG PO TABS: 25.0000 mg | ORAL_TABLET | Freq: Two times a day (BID) | ORAL | 1 refills | Status: DC

## 2022-07-07 MED ORDER — DAPAGLIFLOZIN PROPANEDIOL 5 MG PO TABS
5.0000 mg | ORAL_TABLET | Freq: Every day | ORAL | 1 refills | Status: DC
Start: 2022-07-07 — End: 2022-09-25

## 2022-07-07 NOTE — Telephone Encounter (Signed)
Pt needs refills on  Farixga Losartan Metoprolol

## 2022-07-11 ENCOUNTER — Other Ambulatory Visit: Payer: Self-pay | Admitting: Medical

## 2022-07-11 DIAGNOSIS — K219 Gastro-esophageal reflux disease without esophagitis: Secondary | ICD-10-CM

## 2022-07-13 ENCOUNTER — Ambulatory Visit (INDEPENDENT_AMBULATORY_CARE_PROVIDER_SITE_OTHER): Payer: PPO | Admitting: Pulmonary Disease

## 2022-07-13 ENCOUNTER — Telehealth: Payer: Self-pay | Admitting: Internal Medicine

## 2022-07-13 ENCOUNTER — Encounter: Payer: Self-pay | Admitting: Pulmonary Disease

## 2022-07-13 VITALS — BP 124/72 | HR 71 | Ht 63.0 in | Wt 150.0 lb

## 2022-07-13 DIAGNOSIS — J9611 Chronic respiratory failure with hypoxia: Secondary | ICD-10-CM

## 2022-07-13 DIAGNOSIS — I2721 Secondary pulmonary arterial hypertension: Secondary | ICD-10-CM

## 2022-07-13 MED ORDER — BLOOD GLUCOSE TEST VI STRP: ORAL_STRIP | 1 refills | Status: DC

## 2022-07-13 MED ORDER — LANCETS MISC. MISC: 1 refills | Status: DC

## 2022-07-13 MED ORDER — BLOOD GLUCOSE MONITORING SUPPL DEVI: 0 refills | Status: DC

## 2022-07-13 MED ORDER — LANCET DEVICE MISC: 0 refills | Status: DC

## 2022-07-13 NOTE — Patient Instructions (Signed)
Nice to see you again  Continue tadalafil 2 pills (40 mg) once a day  Continue taking the Lasix  We are going to add another medicine called Opsumit.  It will be 1 tablet once a day.  Will fill out paperwork today and sent off to the insurance company for approval.  This may take a few weeks.  Continue exercising  Return to clinic in 3 months or sooner as needed with Dr. Judeth Horn

## 2022-07-13 NOTE — Progress Notes (Signed)
@Patient  ID: Jessica Hart, female    DOB: 26-Jun-1940, 82 y.o.   MRN: [id number]  Chief Complaint  Patient presents with   Follow-up    2 mo f/u for pulm HTN. States she has noticed more swelling in her left ankle.     Referring provider: Genia Del  HPI:   82 y.o. woman whom we are seeing in follow up for volume overload and diagnosis of pulmonary HTN on RHC.  Patient presents for routine follow-up.  Weight stable.  A little bit of lower extremity swelling.  Sounds like her Lasix refills get late and she stretches out her supply.  Overall she thinks her dyspnea is improved.  Exercising more.  We reviewed her echocardiogram from 04/2022 with worsening signs.  Discussed adding additional medication for pulmonary hypertension.  She is amenable to this.  HPI at initial visit: Was in usual state of health.  Was seen PCP and routine follow-up.  Noted to be hypoxemic work-up, rooming.  Noted little bit of cough for 3 days.  She was placed on 1 L of oxygen.  Came up to 92%.  She was placed on Trelegy.  She was referred to pulmonary.  Home oxygen was delivered.  She represented to PCP about 10 days later.  Noted to be at 87% on 1 L with exertion although no changes were made to oxygen prescription.  She denies significant dyspnea, nothing too significant.  Able to do tasks he needs to do.  Reviewed multiple CT scans that shows mild emphysematous changes primarily in the upper lobes with interlobular septal thickening and bronchiectasis in bilateral lower lobes without clear honeycombing on my interpretation.  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:   ACT:      No data to display            MMRC:     No data to display           Epworth:      No data to display           Tests:   FENO:  No results found for: "NITRICOXIDE"  PFT:    Latest Ref Rng & Units 04/06/2021    3:35 PM  PFT Results  FVC-Pre L 1.74   FVC-Predicted Pre % 89   FVC-Post L 1.64   FVC-Predicted Post % 84   Pre FEV1/FVC % % 83   Post FEV1/FCV % % 84   FEV1-Pre L 1.44   FEV1-Predicted Pre % 96   FEV1-Post L 1.37   DLCO uncorrected ml/min/mmHg 6.61   DLCO UNC% % 36   DLVA Predicted % 55   TLC L 3.72   TLC % Predicted % 75   RV % Predicted % 60   Personally reviewed interpret spirometry suggestive of moderate to mild restriction versus gas trapping, no bronchodilator response, no fixed obstruction, TLC moderately reduced, DLCO severely reduced  WALK:      No data to display           Imaging: Personally reviewed and as per EMR discussion of this note No results found.  Lab Results: Personally reviewed, no anemia, no polycythemia CBC    Component Value Date/Time   WBC 8.2 09/29/2021 0427   RBC  5.41 (H) 09/29/2021 0427   HGB 15.3 (H) 09/29/2021 0427   HGB 14.5 02/08/2021 1205   HCT 46.3 (H) 09/29/2021 0427   HCT 45.6 02/08/2021 1205   PLT 210 09/29/2021 0427   PLT 286 02/08/2021 1205   MCV 85.6 09/29/2021 0427   MCV 85 02/08/2021 1205   MCH 28.3 09/29/2021 0427   MCHC 33.0 09/29/2021 0427   RDW 18.1 (H) 09/29/2021 0427   RDW 14.6 02/08/2021 1205   LYMPHSABS 1.4 09/28/2021 2133   LYMPHSABS 1.9 02/08/2021 1205   MONOABS 0.4 09/28/2021 2133   EOSABS 0.1 09/28/2021 2133   EOSABS 0.1 02/08/2021 1205   BASOSABS 0.1 09/28/2021 2133   BASOSABS 0.1 02/08/2021 1205    BMET    Component Value Date/Time   NA 138 10/10/2021 1321   K 4.4 10/10/2021 1321   CL 100 10/10/2021 1321   CO2 20 10/10/2021 1321   GLUCOSE 125 (H) 10/10/2021 1321   GLUCOSE 106 (H) 09/29/2021 0427   BUN 22 10/10/2021 1321   CREATININE 1.46 (H) 10/10/2021 1321   CREATININE  0.69 01/10/2017 1235   CALCIUM 9.7 10/10/2021 1321   GFRNONAA 51 (L) 09/29/2021 0427   GFRNONAA 86 01/10/2017 1235   GFRAA 97 02/02/2020 1108   GFRAA 99 01/10/2017 1235    BNP    Component Value Date/Time   BNP 833.9 (H) 10/10/2021 1321   BNP 1,007.8 (H) 09/28/2021 2133    ProBNP    Component Value Date/Time   PROBNP 1,156.0 (H) 03/06/2022 1552    Specialty Problems       Pulmonary Problems   SOB (shortness of breath)   Pulmonary emphysema (HCC)   Pulmonary nodules   Chronic respiratory failure with hypoxia (HCC)    Allergies  Allergen Reactions   Lisinopril Itching and Cough    Immunization History  Administered Date(s) Administered   Fluad Quad(high Dose 65+) 11/11/2018   Influenza Whole 12/11/2007   Influenza, High Dose Seasonal PF 02/29/2016, 01/10/2017, 11/15/2017   Influenza,inj,Quad PF,6+ Mos 10/29/2012, 04/06/2014, 02/19/2015   PFIZER(Purple Top)SARS-COV-2 Vaccination 04/22/2019, 05/13/2019, 01/30/2020   Pneumococcal Conjugate-13 04/06/2014   Pneumococcal Polysaccharide-23 12/11/2007   Td 05/28/2004    Past Medical History:  Diagnosis Date   Arthritis    Cataract bilaterl 3 years ago   Coronary artery disease    Diabetes mellitus 02/27/2005   Diabetes mellitus type 2, controlled, without complications (HCC) 04/26/2006   Qualifier: Diagnosis of  By: Haydee Salter     Diverticulosis    Dyspnea    Echocardiogram findings abnormal, without diagnosis 2005   EF 47%, no ischemia, no infarction   Former smoker 01/10/2017   45 pack year history, quit in 2007    GERD (gastroesophageal reflux disease)    Heart murmur    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   History of bone density study 2006   Lt hip = -1.0, L spine = -1.8    Hyperlipidemia    Hypertension     Tobacco History: Social History   Tobacco Use  Smoking Status Former   Packs/day: 1.00   Years: 45.00   Additional pack years: 0.00   Total pack years: 45.00   Types: Cigarettes   Quit date:  07/27/2005   Years since quitting: 16.9  Smokeless Tobacco Former   Quit date: 03/2005   Counseling given: Not Answered   Continue to not smoke  Outpatient Encounter Medications as of 07/13/2022  Medication Sig   acetaminophen (TYLENOL) 500 MG tablet Take 2 tablets (1,000  mg total) by mouth every 8 (eight) hours as needed.   aspirin EC 81 MG tablet Take 1 tablet (81 mg total) by mouth daily.   Blood Glucose Monitoring Suppl DEVI Test 1-2 times day. Pt needs accu-chek metre   Cholecalciferol (D3 5000) 125 MCG (5000 UT) capsule Take 5,000 Units by mouth daily.   CREON 36000-114000 units CPEP capsule TAKE 2 CAPSULES BY MOUTH 3 TIMES A DAY WITH MEALS AND TAKE 1 CAPSULE WITH EACH SNACK TWICE DAILY (Patient taking differently: Take 36,000 Units by mouth 3 (three) times daily with meals.)   dapagliflozin propanediol (FARXIGA) 5 MG TABS tablet Take 1 tablet (5 mg total) by mouth daily.   estradiol (ESTRACE) 0.1 MG/GM vaginal cream Pea size amount per vagina 1-2 times per week   Fluticasone-Umeclidin-Vilant (TRELEGY ELLIPTA) 100-62.5-25 MCG/ACT AEPB Inhale 1 puff into the lungs daily. TAKE 1 PUFF BY MOUTH EVERY DAY Strength: 100-62.5-25 MCG/ACT   furosemide (LASIX) 20 MG tablet Take 3 tablets (60 mg total) by mouth daily.   Glucose Blood (BLOOD GLUCOSE TEST STRIPS) STRP Test 1-2 times daily. Needs accu-chek strips   insulin degludec (TRESIBA FLEXTOUCH) 100 UNIT/ML FlexTouch Pen Inject 15 Units into the skin daily after supper.   Lancet Device MISC 1-2 times daily   Lancets Misc. MISC 1-2 times a day. Needs accu-chek lancets   losartan (COZAAR) 50 MG tablet Take 1 tablet (50 mg total) by mouth daily.   metFORMIN (GLUCOPHAGE-XR) 750 MG 24 hr tablet TAKE 1 TABLET BY MOUTH EVERY DAY WITH BREAKFAST   metoprolol tartrate (LOPRESSOR) 25 MG tablet Take 1 tablet (25 mg total) by mouth 2 (two) times daily. Take 25 mg by mouth 2 (two) times daily.   Misc. Devices MISC SIG: 1 L of Oxygen via Nasal cannula  around the clock Dispense Home Oxygen and Portable tank and necessary supplies. Dx: Hypoxemia, Shortness of breath (Patient taking differently: Place 4-4.5 L into the nose See admin instructions. 4 - 4.5 L of Oxygen via Nasal cannula around the clock Dispense Home Oxygen and Portable tank and necessary supplies. Dx: Hypoxemia, Shortness of breath)   Multiple Vitamins-Minerals (NO IRON MULT VITAMIN-MINERALS) TABS TAKE 1 TABLET BY MOUTH DAILY (Patient taking differently: Take 1 tablet by mouth daily.)   omeprazole (PRILOSEC) 40 MG capsule TAKE 1 CAPSULE (40 MG TOTAL) BY MOUTH DAILY.   ONETOUCH VERIO test strip SMARTSIG:1 Each Via Meter Daily   potassium chloride (KLOR-CON) 10 MEQ tablet Take 2 tablets (20 mEq total) by mouth daily.   rosuvastatin (CRESTOR) 20 MG tablet TAKE 1 TABLET BY MOUTH EVERY DAY   tadalafil, PAH, (ADCIRCA) 20 MG tablet TAKE 2 TABLETS BY MOUTH 1 TIME A DAY.   vitamin B-12 (CYANOCOBALAMIN) 500 MCG tablet Take 500 mcg by mouth daily.   No facility-administered encounter medications on file as of 07/13/2022.     Review of Systems  Review of Systems  N/a Physical Exam  BP 124/72   Pulse 71   Ht 5\' 3"  (1.6 m)   Wt 150 lb (68 kg)   SpO2 93% Comment: on 4L  BMI 26.57 kg/m   Wt Readings from Last 5 Encounters:  07/13/22 150 lb (68 kg)  04/17/22 153 lb (69.4 kg)  04/13/22 154 lb 12.8 oz (70.2 kg)  03/21/22 150 lb (68 kg)  03/06/22 151 lb 12.8 oz (68.9 kg)    BMI Readings from Last 5 Encounters:  07/13/22 26.57 kg/m  04/17/22 27.10 kg/m  04/13/22 27.42 kg/m  03/21/22 26.57 kg/m  03/06/22 26.89 kg/m     Physical Exam General: Well-appearing, no acute distress Eyes: EOMI, no icterus Pulmonary: Clear, normal breathing, no crackles Cardiovascular: Regular rate and rhythm, no murmurs MSK: No synovitis, no joint effusion Neuro: Normal gait, no weakness Psych: Normal mood, full affect   Assessment & Plan:   Chronic hypoxemic respiratory failure: Suspect  related to emphysema seen on CT scan.  Possible basilar fibrosis could not appear classic for UIP but no other clear pattern.  Possibly due to recurrent aspiration in setting of long history of GERD.  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  Also suspect contribution of VQ mismatch due to recently discovered likely pulmonary hypertension on TTE.  It was reiterated again how she should be using oxygen.  She is to use all times during the day at rest and with exertion.  Stressed keeping oxygen saturation above 90%, ideally greater than 92%.  Continue nocturnal oxygen, 4 L.  She is repeatedly asked repetitively failed trials of POC in the office with ambulation.  POC not an option as it does not keep her oxygen saturation above 88% likely as a result of the pulsed dosing.  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  She has poor insight into this. Consider antifibrotic's but she has declined other medications to treat pulmonary issues.  Can reevaluate in the future.    Pulmonary hypertension: Based on TTE with significant findings of RV enlargement, mildly reduced RV function, mildly dilated RA all new compared to 2021.  Confirmed on right heart cath 06/2021 with normal LVEDP, preserved CO and CI.  Given rapid progression of disease group 1 disease is most likely.  Suspect contribution of group 3 disease and hypoxemia and nocturnal hypoxia via as historically she has not used oxygen as prescribed now with better adherence.  No OSA on PSG.  Possible group 2 disease given diastolic dysfunction although not sure how big contributor this is.  No history of VTE/PE to suggest group 4 disease.  Long and frank discussion with patient.  She is not a very good candidate for pulmonary vasodilators given the multifactorial nature of disease, significant emphysema on CT scan, demonstration of some lack of understanding of instructions in the past.  Given her significant emphysema, inhaled vasodilators have not  demonstrated effectiveness in the past trials.  Systemic vasodilators pose significant risk of VQ mismatch.  We did discussed using pulmonary vasodilators both inhaled and oral.  Discussed at length the side effect profile and what to expect.  Discussed risk and benefits in terms of VQ mismatch in setting of parenchymal disease as well possible bilateral pulmonary edema depending on how well left-sided heart handles increased preload.  I think she would really struggle with 4 times daily medication such as Tyvaso.  We discussed at length that I would prefer this agent but given the 4 times daily dosing she does not think this is doable.  Nor does her husband.  Tolerating tadalafil 40 mg daily with improved subjective DOE and appetite, energy.  Continue Lasix.  Given worsening echocardiogram and likely inability to do Tyvaso so frequently, and her tolerance of tadalafil, trial ERA via Opsumit.  Return in about 3 months (around 10/13/2022).   Karren Burly, MD 07/13/2022  I spent 42 minutes in the care of the patient including face-to-face visit, review of records, coordination of care.

## 2022-07-13 NOTE — Telephone Encounter (Signed)
-----   Message from Sherrill Raring, [facility] sent at 07/13/2022  8:53 AM EDT ----- Regarding: Diabetic Metter and Supplies Hello,  Patient is requesting a prescription for Accu Chek Guide Meter and the test strips/lancets to use with it.  [facility]erred Pharmacy: [id number] - [place], [place] - [address]  Phone: [phone] Fax: [phone]   Thank you, Sherrill Raring Clinical Pharmacist [phone]

## 2022-07-13 NOTE — Telephone Encounter (Signed)
*  STAT* If patient is at the pharmacy, call can be transferred to refill team.   1. Which medications need to be refilled? (please list name of each medication and dose if known) Furosemide  2. Which pharmacy/location (including street and city if local pharmacy) is medication to be sent to?[facility] [address] [place]  3. Do they need a 30 day or 90 day supply? #180 and refills-please call p today- she is out of her medicine

## 2022-07-13 NOTE — Telephone Encounter (Signed)
done

## 2022-07-17 ENCOUNTER — Telehealth: Payer: Self-pay

## 2022-07-17 DIAGNOSIS — I2721 Secondary pulmonary arterial hypertension: Secondary | ICD-10-CM

## 2022-07-17 NOTE — Telephone Encounter (Signed)
Received New start paperwork for OPSUMIT. Will update as we work through the benefits process.  Submitted a Prior Authorization request to  [employer]  for OPSUMIT via CoverMyMeds. Will update once we receive a response.  Key: [id number]

## 2022-07-18 ENCOUNTER — Other Ambulatory Visit: Payer: Self-pay

## 2022-07-18 MED ORDER — FUROSEMIDE 20 MG PO TABS: 60.0000 mg | ORAL_TABLET | Freq: Every day | ORAL | 3 refills | Status: DC

## 2022-07-18 NOTE — Telephone Encounter (Signed)
Received notification from  [employer]  regarding a prior authorization for OPSUMIT. Authorization has been APPROVED from 07/17/2022 to 01/17/2023. Approval letter sent to scan center.  Authorization # [id number] Phone # [phone]  Forms for Opsumit submitted to Lucy Chris for [facility]  Fax: [phone] Phone: [phone]  Chesley Mires, PharmD, MPH, BCPS, CPP Clinical Pharmacist (Rheumatology and Pulmonology)

## 2022-07-18 NOTE — Telephone Encounter (Signed)
Patient is calling back stating this medication was never called in and she is at pharmacy now.

## 2022-07-18 NOTE — Telephone Encounter (Signed)
RX sent to pharmacy.  Recent notes to continue Lasix.  Last visit 03/2022- upcoming appointment 10/17/2022. Thanks!

## 2022-07-19 ENCOUNTER — Other Ambulatory Visit: Payer: Self-pay | Admitting: Internal Medicine

## 2022-07-19 ENCOUNTER — Other Ambulatory Visit: Payer: Self-pay | Admitting: Medical

## 2022-07-20 ENCOUNTER — Telehealth: Payer: Self-pay | Admitting: Medical

## 2022-07-20 ENCOUNTER — Ambulatory Visit: Payer: PPO | Admitting: Podiatry

## 2022-07-20 DIAGNOSIS — E1165 Type 2 diabetes mellitus with hyperglycemia: Secondary | ICD-10-CM

## 2022-07-20 DIAGNOSIS — E119 Type 2 diabetes mellitus without complications: Secondary | ICD-10-CM

## 2022-07-20 MED ORDER — OPSUMIT 10 MG PO TABS
10.0000 mg | ORAL_TABLET | Freq: Every day | ORAL | 5 refills | Status: DC
Start: 2022-07-20 — End: 2022-07-25

## 2022-07-20 NOTE — Telephone Encounter (Signed)
Returned call to patient regarding Opsumit. Reviewed most common side effects including headache, runny nose, sore throuat. Reviewed hepatic monitoring.  Advised she is to take Opsumit 1 tab once daily and continue tadalafil 40mg  once daily. Reviewed that this is an add-on.  She has received shipment of Opsumit and will start  Chesley Mires, PharmD, MPH, BCPS, CPP Clinical Pharmacist (Rheumatology and Pulmonology)

## 2022-07-20 NOTE — Telephone Encounter (Signed)
Recv'd rejection for all the diabetic supplies & monitor.  Called pharmacy and One touch is preferred, they could not verbally take the change.  [facility]  

## 2022-07-20 NOTE — Telephone Encounter (Signed)
Patient would like to discuss directions for Opsumit. Patient phone number is [phone].

## 2022-07-21 MED ORDER — BLOOD GLUCOSE TEST VI STRP
ORAL_STRIP | 1 refills | Status: AC
Start: 2022-07-21 — End: ?

## 2022-07-21 MED ORDER — LANCET DEVICE MISC
0 refills | Status: AC
Start: 2022-07-21 — End: ?

## 2022-07-21 MED ORDER — BLOOD GLUCOSE MONITORING SUPPL DEVI
0 refills | Status: AC
Start: 2022-07-21 — End: ?

## 2022-07-21 MED ORDER — LANCETS MISC. MISC
1 refills | Status: AC
Start: 2022-07-21 — End: ?

## 2022-07-21 NOTE — Telephone Encounter (Signed)
Refilled lancets, strips device and meter for one touch

## 2022-07-25 ENCOUNTER — Telehealth: Payer: Self-pay | Admitting: Medical

## 2022-07-25 MED ORDER — OPSUMIT 10 MG PO TABS
10.0000 mg | ORAL_TABLET | Freq: Every day | ORAL | 5 refills | Status: DC
Start: 2022-07-25 — End: 2022-08-01

## 2022-07-25 NOTE — Telephone Encounter (Signed)
Please call re meter that was sent in, $30 and she can't afford

## 2022-07-25 NOTE — Telephone Encounter (Signed)
Pt was notified. They reran through insurance and it is a 0 copay

## 2022-07-25 NOTE — Addendum Note (Signed)
Addended by: Murrell Redden on: 07/25/2022 03:08 PM   Modules accepted: Orders

## 2022-07-26 DIAGNOSIS — I2699 Other pulmonary embolism without acute cor pulmonale: Secondary | ICD-10-CM | POA: Diagnosis not present

## 2022-07-27 ENCOUNTER — Telehealth: Payer: Self-pay | Admitting: Pulmonary Disease

## 2022-07-27 NOTE — Telephone Encounter (Signed)
Pharmacy called to request a refill for the patient's tadalafil, PAH, (ADCIRCA) 20 MG tablet .  Please advise.

## 2022-07-28 MED ORDER — TADALAFIL (PAH) 20 MG PO TABS: ORAL_TABLET | ORAL | 5 refills | Status: DC

## 2022-07-28 NOTE — Telephone Encounter (Addendum)
Refill of pt's tadalafil has been sent to pharmacy for pt. Nothing further needed.

## 2022-07-31 ENCOUNTER — Telehealth: Payer: Self-pay | Admitting: Pulmonary Disease

## 2022-08-01 MED ORDER — OPSUMIT 10 MG PO TABS
10.0000 mg | ORAL_TABLET | Freq: Every day | ORAL | 5 refills | Status: DC
Start: 2022-08-01 — End: 2023-02-02

## 2022-08-01 NOTE — Telephone Encounter (Signed)
Rx for Opsumit sent to pharmacy - was accidentally ordered as no-print  Chesley Mires, PharmD, MPH, BCPS, CPP Clinical Pharmacist (Rheumatology and Pulmonology)

## 2022-08-01 NOTE — Addendum Note (Signed)
Addended by: Murrell Redden on: 08/01/2022 03:36 PM   Modules accepted: Orders

## 2022-08-10 ENCOUNTER — Ambulatory Visit: Payer: PPO | Admitting: Podiatry

## 2022-08-10 ENCOUNTER — Ambulatory Visit (INDEPENDENT_AMBULATORY_CARE_PROVIDER_SITE_OTHER): Payer: PPO | Admitting: Podiatry

## 2022-08-10 DIAGNOSIS — B351 Tinea unguium: Secondary | ICD-10-CM

## 2022-08-10 DIAGNOSIS — E1142 Type 2 diabetes mellitus with diabetic polyneuropathy: Secondary | ICD-10-CM

## 2022-08-10 DIAGNOSIS — L84 Corns and callosities: Secondary | ICD-10-CM | POA: Diagnosis not present

## 2022-08-10 DIAGNOSIS — M79674 Pain in right toe(s): Secondary | ICD-10-CM | POA: Diagnosis not present

## 2022-08-10 DIAGNOSIS — M79675 Pain in left toe(s): Secondary | ICD-10-CM

## 2022-08-10 NOTE — Progress Notes (Signed)
  Subjective:  Patient ID: Jessica Hart, female    DOB: Mar 12, 1940,  MRN: [id number]  Chief Complaint  Patient presents with   Nail Problem    RFC    82 y.o. female presents with the above complaint. History confirmed with patient. Patient presenting with pain related to dystrophic thickened elongated nails. Patient is unable to trim own nails related to nail dystrophy and/or mobility issues. Patient does have a history of T2DM. Patient does have callus present located at the left plantar foot sub 2nd and 5th met head causing pain.  Patient does have some level of numbness in the feet however she says it is not pronounced.  She does take insulin.    Objective:  Physical Exam: warm, good capillary refill nail exam onychomycosis of the toenails, onycholysis, and dystrophic nails protective sensation absent DP pulses are very weakly palpable nonpalpable deep PT pulses cap refill intact all toes. Left Foot:  Pain with palpation of nails due to elongation and dystrophic growth.  Right Foot: Pain with palpation of nails due to elongation and dystrophic growth.   Assessment:   1. Pain due to onychomycosis of toenails of both feet   2. Pre-ulcerative calluses   3. DM type 2 with diabetic peripheral neuropathy (HCC)       Plan:  Patient was evaluated and treated and all questions answered.  #Hyperkeratotic lesions/pre ulcerative calluses present subsecond metatarsal head and subfifth metatarsal head on the left foot All symptomatic hyperkeratoses x 2 separate lesions were safely debrided with a sterile #10 blade to patient's level of comfort without incident. We discussed preventative and palliative care of these lesions including supportive and accommodative shoegear, padding, prefabricated and custom molded accommodative orthoses, use of a pumice stone and lotions/creams daily.  #Onychomycosis with pain  -Nails palliatively debrided as below. -Educated on self-care  Procedure:  Nail Debridement Rationale: Pain Type of Debridement: manual, sharp debridement. Instrumentation: Nail nipper, rotary burr. Number of Nails: 10  Return in about 3 months (around 11/10/2022) for [facility]         Corinna Gab, DPM [facility]

## 2022-08-26 DIAGNOSIS — I2699 Other pulmonary embolism without acute cor pulmonale: Secondary | ICD-10-CM | POA: Diagnosis not present

## 2022-09-03 ENCOUNTER — Other Ambulatory Visit: Payer: Self-pay | Admitting: Gastroenterology

## 2022-09-21 ENCOUNTER — Ambulatory Visit: Payer: PPO | Admitting: Podiatry

## 2022-09-25 ENCOUNTER — Ambulatory Visit (INDEPENDENT_AMBULATORY_CARE_PROVIDER_SITE_OTHER): Payer: PPO | Admitting: Medical

## 2022-09-25 VITALS — BP 120/70 | HR 95 | Wt 144.2 lb

## 2022-09-25 DIAGNOSIS — E1169 Type 2 diabetes mellitus with other specified complication: Secondary | ICD-10-CM

## 2022-09-25 DIAGNOSIS — E1165 Type 2 diabetes mellitus with hyperglycemia: Secondary | ICD-10-CM

## 2022-09-25 DIAGNOSIS — R809 Proteinuria, unspecified: Secondary | ICD-10-CM | POA: Diagnosis not present

## 2022-09-25 DIAGNOSIS — I152 Hypertension secondary to endocrine disorders: Secondary | ICD-10-CM | POA: Diagnosis not present

## 2022-09-25 DIAGNOSIS — K8689 Other specified diseases of pancreas: Secondary | ICD-10-CM | POA: Diagnosis not present

## 2022-09-25 DIAGNOSIS — E1159 Type 2 diabetes mellitus with other circulatory complications: Secondary | ICD-10-CM

## 2022-09-25 DIAGNOSIS — J9611 Chronic respiratory failure with hypoxia: Secondary | ICD-10-CM

## 2022-09-25 DIAGNOSIS — H269 Unspecified cataract: Secondary | ICD-10-CM

## 2022-09-25 DIAGNOSIS — Z794 Long term (current) use of insulin: Secondary | ICD-10-CM | POA: Diagnosis not present

## 2022-09-25 DIAGNOSIS — Z7189 Other specified counseling: Secondary | ICD-10-CM | POA: Insufficient documentation

## 2022-09-25 DIAGNOSIS — K219 Gastro-esophageal reflux disease without esophagitis: Secondary | ICD-10-CM

## 2022-09-25 DIAGNOSIS — I7090 Unspecified atherosclerosis: Secondary | ICD-10-CM

## 2022-09-25 DIAGNOSIS — I5032 Chronic diastolic (congestive) heart failure: Secondary | ICD-10-CM | POA: Diagnosis not present

## 2022-09-25 DIAGNOSIS — Z79899 Other long term (current) drug therapy: Secondary | ICD-10-CM

## 2022-09-25 DIAGNOSIS — Z0000 Encounter for general adult medical examination without abnormal findings: Secondary | ICD-10-CM | POA: Diagnosis not present

## 2022-09-25 DIAGNOSIS — I2699 Other pulmonary embolism without acute cor pulmonale: Secondary | ICD-10-CM | POA: Diagnosis not present

## 2022-09-25 DIAGNOSIS — Z951 Presence of aortocoronary bypass graft: Secondary | ICD-10-CM

## 2022-09-25 DIAGNOSIS — J439 Emphysema, unspecified: Secondary | ICD-10-CM | POA: Diagnosis not present

## 2022-09-25 DIAGNOSIS — E785 Hyperlipidemia, unspecified: Secondary | ICD-10-CM | POA: Diagnosis not present

## 2022-09-25 DIAGNOSIS — I2721 Secondary pulmonary arterial hypertension: Secondary | ICD-10-CM

## 2022-09-25 LAB — CBC WITH DIFFERENTIAL/PLATELET
Basophils Absolute: 0.1 10*3/uL (ref 0.0–0.2)
Basos: 1 %
EOS (ABSOLUTE): 0.1 10*3/uL (ref 0.0–0.4)
Eos: 1 %
Hematocrit: 44.7 % (ref 34.0–46.6)
Hemoglobin: 15 g/dL (ref 11.1–15.9)
Immature Grans (Abs): 0 10*3/uL (ref 0.0–0.1)
Immature Granulocytes: 0 %
Lymphocytes Absolute: 1 10*3/uL (ref 0.7–3.1)
Lymphs: 13 %
MCH: 29.5 pg (ref 26.6–33.0)
MCHC: 33.6 g/dL (ref 31.5–35.7)
MCV: 88 fL (ref 79–97)
Monocytes Absolute: 0.7 10*3/uL (ref 0.1–0.9)
Monocytes: 9 %
Neutrophils Absolute: 5.8 10*3/uL (ref 1.4–7.0)
Neutrophils: 76 %
Platelets: 231 10*3/uL (ref 150–450)
RBC: 5.08 x10E6/uL (ref 3.77–5.28)
RDW: 14.6 % (ref 11.7–15.4)
WBC: 7.6 10*3/uL (ref 3.4–10.8)

## 2022-09-25 LAB — COMPREHENSIVE METABOLIC PANEL
ALT: 13 IU/L (ref 0–32)
AST: 26 IU/L (ref 0–40)
Albumin: 4.6 g/dL (ref 3.8–4.8)
Alkaline Phosphatase: 151 IU/L — ABNORMAL HIGH (ref 44–121)
BUN/Creatinine Ratio: 18 (ref 12–28)
BUN: 21 mg/dL (ref 8–27)
Bilirubin Total: 0.9 mg/dL (ref 0.0–1.2)
CO2: 21 mmol/L (ref 20–29)
Calcium: 10 mg/dL (ref 8.7–10.3)
Chloride: 100 mmol/L (ref 96–106)
Creatinine, Ser: 1.18 mg/dL — ABNORMAL HIGH (ref 0.57–1.00)
Globulin, Total: 3.2 g/dL (ref 1.5–4.5)
Glucose: 200 mg/dL — ABNORMAL HIGH (ref 70–99)
Potassium: 4.2 mmol/L (ref 3.5–5.2)
Sodium: 137 mmol/L (ref 134–144)
Total Protein: 7.8 g/dL (ref 6.0–8.5)
eGFR: 47 mL/min/{1.73_m2} — ABNORMAL LOW (ref >59)

## 2022-09-25 LAB — HEMOGLOBIN A1C
Est. average glucose Bld gHb Est-mCnc: 140 mg/dL
Hgb A1c MFr Bld: 6.5 % — ABNORMAL HIGH (ref 4.8–5.6)

## 2022-09-25 LAB — TSH: TSH: 1.66 u[IU]/mL (ref 0.450–4.500)

## 2022-09-25 MED ORDER — METOPROLOL TARTRATE 25 MG PO TABS: 25.0000 mg | ORAL_TABLET | Freq: Two times a day (BID) | ORAL | 1 refills | Status: DC

## 2022-09-25 MED ORDER — POTASSIUM CHLORIDE ER 10 MEQ PO TBCR: 20.0000 meq | EXTENDED_RELEASE_TABLET | Freq: Every day | ORAL | 1 refills | Status: DC

## 2022-09-25 MED ORDER — DAPAGLIFLOZIN PROPANEDIOL 5 MG PO TABS
5.0000 mg | ORAL_TABLET | Freq: Every day | ORAL | 1 refills | Status: DC
Start: 2022-09-25 — End: 2023-11-29

## 2022-09-25 NOTE — Patient Instructions (Signed)
This visit was a preventative care visit, also known as wellness visit or routine physical.   Topics typically include healthy lifestyle, diet, exercise, preventative care, vaccinations, sick and well care, proper use of emergency dept and after hours care, as well as other concerns.    Separate significant issues discussed: Pulmonary hypertension, chronic hypoxemic respiratory failure-I reviewed pulmonology notes from May 2024.  They want her to keep oxygen saturation ideally greater than 92%.  Currently on 4 L nighttime oxygen, on daily use oxygen as well.  She has mild interstitial lung disease as well.  She has right ventricle enlargement, mildly reduced right ventricle function, mildly dilated right atrium, confirm a right heart cath May 2023.  She was continued on Lasix, tadalafil and trial of Opsumit recently.   Reviewed recent cardiology notes from February 2024.  For blood pressure she was continued on losartan 50 mg daily and metoprolol 25 mg twice daily.  Coronary artery disease-continue aspirin 81 mg daily, metoprolol 50 mg twice daily and rosuvastatin 10 mg daily.  Heart association class II  Pulmonary hypertension, continues with lasix daily and routine f/u with heart failure clinic for management   General Recommendations: Continue to return yearly for your annual wellness and preventative care visits.  This gives [place] a chance to discuss healthy lifestyle, exercise, vaccinations, review your chart record, and perform screenings where appropriate.  I recommend you see your eye doctor yearly for routine vision care.  I recommend you see your dentist yearly for routine dental care including hygiene visits twice yearly.   Vaccination recommendations were reviewed Immunization History  Administered Date(s) Administered   Fluad Quad(high Dose 65+) 11/11/2018   Influenza Whole 12/11/2007   Influenza, High Dose Seasonal PF 02/29/2016, 01/10/2017, 11/15/2017   Influenza,inj,Quad  PF,6+ Mos 10/29/2012, 04/06/2014, 02/19/2015   PFIZER(Purple Top)SARS-COV-2 Vaccination 04/22/2019, 05/13/2019, 01/30/2020   Pneumococcal Conjugate-13 04/06/2014   Pneumococcal Polysaccharide-23 12/11/2007   Td 05/28/2004    Vaccine recommendations: Shingrix shingles vaccine Yearly flu shot Covid booster Tdap tetanus booster  Counseled on vaccines.   Screening for cancer: Colon cancer screening: Prior or last colon cancer screen: 2019 reviewed  Breast cancer screening: You should perform a self breast exam monthly.   We reviewed recommendations for regular mammograms and breast cancer screening. Last mammogram: 2022  Cervical cancer screening: We reviewed recommendations for pap smear screening. Last pap: N/A, > age 73   Skin cancer screening: Check your skin regularly for new changes, growing lesions, or other lesions of concern Come in for evaluation if you have skin lesions of concern.  Lung cancer screening: If you have a greater than 20 pack year history of tobacco use, then you may qualify for lung cancer screening with a chest CT scan.   Please call your insurance company to inquire about coverage for this test.  Pancreatic cancer: no current screening test is available routinely recommended.  (Risk factors: Smoking, overweight or obese, diabetes, chronic pancreatitis, work Nurse, mental health, Solicitor, 17 year old or greater, female greater than female, African-American, family history of pancreatic cancer, hereditary breast, ovarian, melanoma, Lynch, Peutz-jeghers).  We currently don't have screenings for other cancers besides breast, cervical, colon, and lung cancers.  If you have a strong family history of cancer or have other cancer screening concerns, please let me know.    Bone health: Get at least 150 minutes of aerobic exercise weekly Get weight bearing exercise at least once weekly Bone density test:  A bone density test is an imaging test that uses  a type of X-ray to measure the amount of calcium and other minerals in your bones. The test may be used to diagnose or screen you for a condition that causes weak or thin bones (osteoporosis), predict your risk for a broken bone (fracture), or determine how well your osteoporosis treatment is working. The bone density test is recommended for females 65 and older, or females or males <65 if certain risk factors such as thyroid disease, long term use of steroids such as for asthma or rheumatological issues, vitamin D deficiency, estrogen deficiency, family history of osteoporosis, self or family history of fragility fracture in first degree relative.    Heart health: Get at least 150 minutes of aerobic exercise weekly Limit alcohol It is important to maintain a healthy blood pressure and healthy cholesterol numbers  Heart disease screening: Screening for heart disease includes screening for blood pressure, fasting lipids, glucose/diabetes screening, BMI height to weight ratio, reviewed of smoking status, physical activity, and diet.    Goals include blood pressure 120/80 or less, maintaining a healthy lipid/cholesterol profile, preventing diabetes or keeping diabetes numbers under good control, not smoking or using tobacco products, exercising most days per week or at least 150 minutes per week of exercise, and eating healthy variety of fruits and vegetables, healthy oils, and avoiding unhealthy food choices like fried food, fast food, high sugar and high cholesterol foods.    Continue routine cardiology and pulmonology follow up    Vascular disease screening: For high risk individuals including smokers, diabetes, patients with known heart disease or high blood pressure, kidney disease, and others, screening for vascular disease or atherosclerosis of the arteries is available.  Examples may include carotid ultrasound, abdominal aortic ultrasound, ABI blood flow screening in the legs, thoracic  aorta screening.   Medical care options: I recommend you continue to seek care here first for routine care.  We try really hard to have available appointments Monday through Friday daytime hours for sick visits, acute visits, and physicals.  Urgent care should be used for after hours and weekends for significant issues that cannot wait till the next day.  The emergency department should be used for significant potentially life-threatening emergencies.  The emergency department is expensive, can often have long wait times for less significant concerns, so try to utilize primary care, urgent care, or telemedicine when possible to avoid unnecessary trips to the emergency department.  Virtual visits and telemedicine have been introduced since the pandemic started in 2020, and can be convenient ways to receive medical care.  We offer virtual appointments as well to assist you in a variety of options to seek medical care.   Legal Take the time to do a Last Will and Testament, advanced directives including Healthcare Power of Attorney and Living Will documents.  Do not leave your family with burdens that can be handled ahead of time.   Advanced Directives: I recommend you consider completing a Health Care Power of Attorney and Living Will.   These documents respect your wishes and help alleviate burdens on your loved ones if you were to become terminally ill or be in a position to need those documents enforced.    You can complete Advanced Directives yourself, have them notarized, then have copies made for our office, for you and for anybody you feel should have them in safe keeping.  Or, you can have an attorney prepare these documents.   If you haven't updated your Last Will and Testament in a while, it  may be worthwhile having an attorney prepare these documents together and save on some costs.

## 2022-09-25 NOTE — Progress Notes (Signed)
Subjective:   HPI  Jessica Hart is a 82 y.o. female who presents for Chief Complaint  Patient presents with   Medical Management of Chronic Issues    Diabetes,     Medical team: Dr. Arvilla Meres, heart failure clinic Dr. Vilma Meckel, pulmonology Dr. Weston Brass, cardiology Dr. Jethro Bolus, ophthalmology Dr. Antoine Primas, ortho Dr. Marsa Aris, GI Dr. Narda Bonds, ENT Dr. Carlena Hurl, podiatry [facility], Kermit Balo, PA-C here with primary care   Concerns: Here for review of chronic issues.  Is using large portable tank of oxygen but wants to use the backpack portable oxygen instead.  Is talking with pulmonology about this.     Still gets some lower extremity swelling if sitting for too long.   Lately skin feels better, like a baby.  Not sure why.   Diabetes - using Tresiba 15 u daily, Metformin 750mg  XR daily, Farxiga for both heart and diabetes.  Checks blood sugars most morning.   Lately 80s, under 130.  Compliant with rest of medications as usual  2 medications were recently added by lung doctor, tadalifil and Opsumit   Reviewed their medical, surgical, family, social, medication, and allergy history and updated chart as appropriate.  Allergies  Allergen Reactions   Lisinopril Itching and Cough    Past Medical History:  Diagnosis Date   Arthritis    Cataract bilaterl 3 years ago   Coronary artery disease    Diabetes mellitus 02/27/2005   Diabetes mellitus type 2, controlled, without complications (HCC) 04/26/2006   Qualifier: Diagnosis of  By: Haydee Salter     Diverticulosis    Dyspnea    Echocardiogram findings abnormal, without diagnosis 2005   EF 47%, no ischemia, no infarction   Former smoker 01/10/2017   45 pack year history, quit in 2007    GERD (gastroesophageal reflux disease)    Heart murmur    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   History of bone density study 2006   Lt hip = -1.0, L spine = -1.8    Hyperlipidemia     Hypertension       Current Outpatient Medications:    acetaminophen (TYLENOL) 500 MG tablet, Take 2 tablets (1,000 mg total) by mouth every 8 (eight) hours as needed., Disp: 30 tablet, Rfl: 2   aspirin EC 81 MG tablet, Take 1 tablet (81 mg total) by mouth daily., Disp: 90 tablet, Rfl: 3   Cholecalciferol (D3 5000) 125 MCG (5000 UT) capsule, Take 5,000 Units by mouth daily., Disp: , Rfl:    CREON 36000-114000 units CPEP capsule, TAKE 2 CAPSULES BY MOUTH 3 TIMES A DAY WITH MEALS AND TAKE 1 CAPSULE WITH EACH SNACK TWICE DAILY, Disp: 300 capsule, Rfl: 9   estradiol (ESTRACE) 0.1 MG/GM vaginal cream, Pea size amount per vagina 1-2 times per week, Disp: 42.5 g, Rfl: 2   Fluticasone-Umeclidin-Vilant (TRELEGY ELLIPTA) 100-62.5-25 MCG/ACT AEPB, Inhale 1 puff into the lungs daily. TAKE 1 PUFF BY MOUTH EVERY DAY Strength: 100-62.5-25 MCG/ACT, Disp: 180 each, Rfl: 3   furosemide (LASIX) 20 MG tablet, Take 3 tablets (60 mg total) by mouth daily., Disp: 90 tablet, Rfl: 3   insulin degludec (TRESIBA FLEXTOUCH) 100 UNIT/ML FlexTouch Pen, Inject 15 Units into the skin daily after supper., Disp: 15 mL, Rfl: 2   Lancets Misc. MISC, 1-2 times a day. Needs one touch lancets, Disp: 100 each, Rfl: 1   losartan (COZAAR) 50 MG tablet, Take 1 tablet (50 mg total) by mouth daily., Disp: 90 tablet,  Rfl: 1   macitentan (OPSUMIT) 10 MG tablet, Take 1 tablet (10 mg total) by mouth daily., Disp: 30 tablet, Rfl: 5   metFORMIN (GLUCOPHAGE-XR) 750 MG 24 hr tablet, TAKE 1 TABLET BY MOUTH EVERY DAY WITH BREAKFAST, Disp: 90 tablet, Rfl: 1   Multiple Vitamins-Minerals (NO IRON MULT VITAMIN-MINERALS) TABS, TAKE 1 TABLET BY MOUTH DAILY (Patient taking differently: Take 1 tablet by mouth daily.), Disp: 30 tablet, Rfl: 0   omeprazole (PRILOSEC) 40 MG capsule, TAKE 1 CAPSULE (40 MG TOTAL) BY MOUTH DAILY., Disp: 90 capsule, Rfl: 1   rosuvastatin (CRESTOR) 20 MG tablet, TAKE 1 TABLET BY MOUTH EVERY DAY, Disp: 90 tablet, Rfl: 0    tadalafil, PAH, (ADCIRCA) 20 MG tablet, TAKE 2 TABLETS BY MOUTH 1 TIME A DAY., Disp: 60 tablet, Rfl: 5   vitamin B-12 (CYANOCOBALAMIN) 500 MCG tablet, Take 500 mcg by mouth daily., Disp: , Rfl:    Blood Glucose Monitoring Suppl DEVI, Test 1-2 times day. Pt needs onetouch meter, Disp: 1 each, Rfl: 0   dapagliflozin propanediol (FARXIGA) 5 MG TABS tablet, Take 1 tablet (5 mg total) by mouth daily., Disp: 90 tablet, Rfl: 1   Glucose Blood (BLOOD GLUCOSE TEST STRIPS) STRP, Test 1-2 times daily. Needs onetouch strips, Disp: 100 strip, Rfl: 1   Lancet Device MISC, Use device to test bloodsugar with lancet, Disp: 1 each, Rfl: 0   metoprolol tartrate (LOPRESSOR) 25 MG tablet, Take 1 tablet (25 mg total) by mouth 2 (two) times daily. Take 25 mg by mouth 2 (two) times daily., Disp: 180 tablet, Rfl: 1   Misc. Devices MISC, SIG: 1 L of Oxygen via Nasal cannula around the clock Dispense Home Oxygen and Portable tank and necessary supplies. Dx: Hypoxemia, Shortness of breath (Patient taking differently: Place 4-4.5 L into the nose See admin instructions. 4 - 4.5 L of Oxygen via Nasal cannula around the clock Dispense Home Oxygen and Portable tank and necessary supplies. Dx: Hypoxemia, Shortness of breath), Disp: 1 Device, Rfl: 0   ONETOUCH VERIO test strip, SMARTSIG:1 Each Via Meter Daily, Disp: , Rfl:    potassium chloride (KLOR-CON) 10 MEQ tablet, Take 2 tablets (20 mEq total) by mouth daily., Disp: 180 tablet, Rfl: 1  Family History  Problem Relation Age of Onset   Heart disease Father    Cancer Sister    Tongue cancer Sister    Colon polyps Neg Hx    Rectal cancer Neg Hx    Stomach cancer Neg Hx     Past Surgical History:  Procedure Laterality Date   ABDOMINAL HYSTERECTOMY  1989   one ovary remains per patient   CARDIAC CATHETERIZATION  08/20/2019   COLONOSCOPY     CORONARY ARTERY BYPASS GRAFT N/A 09/02/2019   Procedure: CORONARY ARTERY BYPASS GRAFTING (CABG) TIMES THREE USING LEFT INTERNAL MAMMARY  ARTERY AND RIGHT GREATER SAPHENOUS VEIN;  Surgeon: Corliss Skains, MD;  Location: [facility] OR;  Service: Open Heart Surgery;  Laterality: N/A;   ENDOVEIN HARVEST OF GREATER SAPHENOUS VEIN Right 09/02/2019   Procedure: ENDOVEIN HARVEST OF GREATER SAPHENOUS VEIN;  Surgeon: Corliss Skains, MD;  Location: [facility] OR;  Service: Open Heart Surgery;  Laterality: Right;   EYE SURGERY Bilateral    CATARACT   LEFT HEART CATH AND CORONARY ANGIOGRAPHY N/A 08/20/2019   Procedure: LEFT HEART CATH AND CORONARY ANGIOGRAPHY;  Surgeon: Lyn Records, MD;  Location: [facility] INVASIVE CV LAB;  Service: Cardiovascular;  Laterality: N/A;   RIGHT AND LEFT HEART CATH N/A  07/13/2021   Procedure: RIGHT AND LEFT HEART CATH;  Surgeon: Orbie Pyo, MD;  Location: [facility] INVASIVE CV LAB;  Service: Cardiovascular;  Laterality: N/A;   TEE WITHOUT CARDIOVERSION N/A 09/02/2019   Procedure: TRANSESOPHAGEAL ECHOCARDIOGRAM (TEE);  Surgeon: Corliss Skains, MD;  Location[facility];  Service: Open Heart Surgery;  Laterality: N/A;   TONSILECTOMY, ADENOIDECTOMY, BILATERAL MYRINGOTOMY AND TUBES  1965   TONSILLECTOMY      Review of Systems  Constitutional:  Negative for chills, fever, malaise/fatigue and weight loss.  HENT:  Negative for congestion, ear pain, hearing loss, sore throat and tinnitus.   Eyes:  Negative for blurred vision, pain and redness.  Respiratory:  Positive for shortness of breath. Negative for cough and hemoptysis.   Cardiovascular:  Negative for chest pain, palpitations, orthopnea, claudication and leg swelling.  Gastrointestinal:  Negative for abdominal pain, blood in stool, constipation, diarrhea, nausea and vomiting.  Genitourinary:  Negative for dysuria, flank pain, frequency, hematuria and urgency.  Musculoskeletal:  Negative for falls, joint pain and myalgias.  Skin:  Negative for itching and rash.  Neurological:  Negative for dizziness, tingling, speech change, weakness and headaches.  Endo/Heme/Allergies:   Negative for polydipsia. Does not bruise/bleed easily.  Psychiatric/Behavioral:  Negative for depression and memory loss. The patient is not nervous/anxious and does not have insomnia.         Objective:  BP 120/70   Pulse 95   Wt 144 lb 3.2 oz (65.4 kg)   SpO2 92%   BMI 25.54 kg/m   BP Readings from Last 3 Encounters:  09/25/22 120/70  07/13/22 124/72  04/17/22 122/80   Wt Readings from Last 3 Encounters:  09/25/22 144 lb 3.2 oz (65.4 kg)  07/13/22 150 lb (68 kg)  04/17/22 153 lb (69.4 kg)    General appearance: alert, no distress, WD/WN, African American female Skin: unremarkable HEENT: normocephalic, conjunctiva/corneas normal, sclerae anicteric, PERRLA, EOMi, nares patent, no discharge or erythema, pharynx normal Oral cavity: MMM, tongue normal Neck: supple, no lymphadenopathy, no thyromegaly, no masses, normal ROM, no bruits Chest: non tender, normal shape and expansion Heart: RRR, normal S1, S2, no murmurs Lungs: CTA bilaterally, no wheezes, rhonchi, or rales Abdomen: +bs, soft, non tender, non distended, no masses, no hepatomegaly, no splenomegaly, no bruits Back: non tender, normal ROM, no scoliosis Musculoskeletal: upper extremities non tender, no obvious deformity, normal ROM throughout, lower extremities non tender, no obvious deformity, normal ROM throughout Extremities: mild varicose veins of bilat LE,  no edema, no cyanosis, no clubbing Pulses: 2+ symmetric, upper and lower extremities, normal cap refill Neurological: alert, oriented x 3, CN2-12 intact, strength normal upper extremities and lower extremities, sensation normal throughout, DTRs 2+ throughout, no cerebellar signs, gait normal Psychiatric: normal affect, behavior normal, pleasant  Breast/gyn/rectal - deferred to gynecology   Diabetic Foot Exam - Simple   Simple Foot Form Diabetic Foot exam was performed with the following findings: Yes 09/25/2022 10:42 AM  Visual Inspection See comments:  Yes Sensation Testing Intact to touch and monofilament testing bilaterally: Yes Pulse Check Posterior Tibialis and Dorsalis pulse intact bilaterally: Yes Comments 2 areas of callous on bottom of left foot       Right heart cardiac cath 07/13/21 Conclusion:  Right heart catheterization with pulmonary arterial hypertension with a mean pulmonary artery pressure of 44 mmHg and a pulmonary vascular resistance of approximately 10 Wood units.    06/07/22: echocardiogram Study images independently reviewed. No improvement in the right ventricle size or function  with pulm HTN therapies despite subjective improvement in symptoms. I will cc Dr. Judeth Horn. 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 Indeterminate diastolic function in the setting of severe RV dysfunction and enlargement.    05/22/22 CT chest lung cancer screen IMPRESSION: Lung-RADS 2, benign appearance or behavior. Continue annual screening with low-dose chest CT without contrast in 12 months.   Aortic Atherosclerosis (ICD10-I70.0) and Emphysema (ICD10-J43.9).   Assessment and Plan :   Encounter Diagnoses  Name Primary?   Encounter for health maintenance examination in adult Yes   Pulmonary emphysema, unspecified emphysema type (HCC)    Pulmonary artery hypertension (HCC)    S/P CABG x 3    Type 2 diabetes mellitus with hyperglycemia, unspecified whether long term insulin use (HCC)    Hyperlipidemia associated with type 2 diabetes mellitus (HCC)    Hypertension associated with diabetes (HCC)    Pancreatic insufficiency    Atherosclerosis    Chronic diastolic congestive heart failure (HCC)    Chronic respiratory failure with hypoxia (HCC)    Gastroesophageal reflux disease, unspecified whether esophagitis present    High risk medication use    Advanced directives,  counseling/discussion    Microalbuminuria    Type 2 diabetes mellitus with hyperglycemia, with long-term current use of insulin (HCC)    Cataract, unspecified cataract type, unspecified laterality      This visit was a preventative care visit, also known as wellness visit or routine physical.   Topics typically include healthy lifestyle, diet, exercise, preventative care, vaccinations, sick and well care, proper use of emergency dept and after hours care, as well as other concerns.    Separate significant issues discussed: Pulmonary hypertension, chronic hypoxemic respiratory failure-I reviewed pulmonology notes from May 2024.  They want her to keep oxygen saturation ideally greater than 92%.  Currently on 4 L nighttime oxygen, on daily use oxygen as well.  She has mild interstitial lung disease as well.  She has right ventricle enlargement, mildly reduced right ventricle function, mildly dilated right atrium, confirm a right heart cath May 2023.  She was continued on Lasix, tadalafil and trial of Opsumit recently.   Reviewed recent cardiology notes from February 2024.  For blood pressure she was continued on losartan 50 mg daily and metoprolol 25 mg twice daily.  Coronary artery disease-continue aspirin 81 mg daily, metoprolol 50 mg twice daily and rosuvastatin 10 mg daily.  Heart association class II  Pulmonary hypertension, continues with lasix daily and routine f/u with heart failure clinic for management   General Recommendations: Continue to return yearly for your annual wellness and preventative care visits.  This gives [place] a chance to discuss healthy lifestyle, exercise, vaccinations, review your chart record, and perform screenings where appropriate.  I recommend you see your eye doctor yearly for routine vision care.  I recommend you see your dentist yearly for routine dental care including hygiene visits twice yearly.   Vaccination recommendations were reviewed Immunization  History  Administered Date(s) Administered   Fluad Quad(high Dose 65+) 11/11/2018   Influenza Whole 12/11/2007   Influenza, High Dose Seasonal PF 02/29/2016, 01/10/2017, 11/15/2017   Influenza,inj,Quad PF,6+ Mos 10/29/2012, 04/06/2014, 02/19/2015   PFIZER(Purple Top)SARS-COV-2 Vaccination 04/22/2019, 05/13/2019, 01/30/2020   Pneumococcal Conjugate-13 04/06/2014   Pneumococcal Polysaccharide-23 12/11/2007   Td 05/28/2004    Vaccine recommendations: Shingrix shingles vaccine Yearly flu shot Covid booster Tdap tetanus  booster  Counseled on vaccines.   Screening for cancer: Colon cancer screening: Prior or last colon cancer screen: 2019 reviewed  Breast cancer screening: You should perform a self breast exam monthly.   We reviewed recommendations for regular mammograms and breast cancer screening. Last mammogram: 2022  Cervical cancer screening: We reviewed recommendations for pap smear screening. Last pap: N/A, > age 57   Skin cancer screening: Check your skin regularly for new changes, growing lesions, or other lesions of concern Come in for evaluation if you have skin lesions of concern.  Lung cancer screening: If you have a greater than 20 pack year history of tobacco use, then you may qualify for lung cancer screening with a chest CT scan.   Please call your insurance company to inquire about coverage for this test.  Pancreatic cancer: no current screening test is available routinely recommended.  (Risk factors: Smoking, overweight or obese, diabetes, chronic pancreatitis, work Nurse, mental health, Solicitor, 3 year old or greater, female greater than female, African-American, family history of pancreatic cancer, hereditary breast, ovarian, melanoma, Lynch, Peutz-jeghers).  We currently don't have screenings for other cancers besides breast, cervical, colon, and lung cancers.  If you have a strong family history of cancer or have other cancer screening concerns,  please let me know.    Bone health: Get at least 150 minutes of aerobic exercise weekly Get weight bearing exercise at least once weekly Bone density test:  A bone density test is an imaging test that uses a type of X-ray to measure the amount of calcium and other minerals in your bones. The test may be used to diagnose or screen you for a condition that causes weak or thin bones (osteoporosis), predict your risk for a broken bone (fracture), or determine how well your osteoporosis treatment is working. The bone density test is recommended for females 65 and older, or females or males <65 if certain risk factors such as thyroid disease, long term use of steroids such as for asthma or rheumatological issues, vitamin D deficiency, estrogen deficiency, family history of osteoporosis, self or family history of fragility fracture in first degree relative.    Heart health: Get at least 150 minutes of aerobic exercise weekly Limit alcohol It is important to maintain a healthy blood pressure and healthy cholesterol numbers  Heart disease screening: Screening for heart disease includes screening for blood pressure, fasting lipids, glucose/diabetes screening, BMI height to weight ratio, reviewed of smoking status, physical activity, and diet.    Goals include blood pressure 120/80 or less, maintaining a healthy lipid/cholesterol profile, preventing diabetes or keeping diabetes numbers under good control, not smoking or using tobacco products, exercising most days per week or at least 150 minutes per week of exercise, and eating healthy variety of fruits and vegetables, healthy oils, and avoiding unhealthy food choices like fried food, fast food, high sugar and high cholesterol foods.    Continue routine cardiology and pulmonology follow up    Vascular disease screening: For high risk individuals including smokers, diabetes, patients with known heart disease or high blood pressure, kidney disease,  and others, screening for vascular disease or atherosclerosis of the arteries is available.  Examples may include carotid ultrasound, abdominal aortic ultrasound, ABI blood flow screening in the legs, thoracic aorta screening.   Medical care options: I recommend you continue to seek care here first for routine care.  We try really hard to have available appointments Monday through Friday daytime hours for sick visits, acute visits, and physicals.  Urgent  care should be used for after hours and weekends for significant issues that cannot wait till the next day.  The emergency department should be used for significant potentially life-threatening emergencies.  The emergency department is expensive, can often have long wait times for less significant concerns, so try to utilize primary care, urgent care, or telemedicine when possible to avoid unnecessary trips to the emergency department.  Virtual visits and telemedicine have been introduced since the pandemic started in 2020, and can be convenient ways to receive medical care.  We offer virtual appointments as well to assist you in a variety of options to seek medical care.   Legal Take the time to do a Last Will and Testament, advanced directives including Healthcare Power of Attorney and Living Will documents.  Do not leave your family with burdens that can be handled ahead of time.   Advanced Directives: I recommend you consider completing a Health Care Power of Attorney and Living Will.   These documents respect your wishes and help alleviate burdens on your loved ones if you were to become terminally ill or be in a position to need those documents enforced.    You can complete Advanced Directives yourself, have them notarized, then have copies made for our office, for you and for anybody you feel should have them in safe keeping.  Or, you can have an attorney prepare these documents.   If you haven't updated your Last Will and Testament in a  while, it may be worthwhile having an attorney prepare these documents together and save on some costs.         Kellyann was seen today for medical management of chronic issues.  Diagnoses and all orders for this visit:  Encounter for health maintenance examination in adult -     Comprehensive metabolic panel -     CBC with Differential/Platelet -     Hemoglobin A1c -     TSH -     Cancel: POCT Urinalysis DIP (Proadvantage Device)  Pulmonary emphysema, unspecified emphysema type (HCC) -     TSH  Pulmonary artery hypertension (HCC)  S/P CABG x 3  Type 2 diabetes mellitus with hyperglycemia, unspecified whether long term insulin use (HCC) -     Hemoglobin A1c -     Ambulatory referral to Ophthalmology  Hyperlipidemia associated with type 2 diabetes mellitus (HCC)  Hypertension associated with diabetes (HCC) -     TSH  Pancreatic insufficiency  Atherosclerosis  Chronic diastolic congestive heart failure (HCC)  Chronic respiratory failure with hypoxia (HCC)  Gastroesophageal reflux disease, unspecified whether esophagitis present  High risk medication use  Advanced directives, counseling/discussion  Microalbuminuria -     Cancel: POCT Urinalysis DIP (Proadvantage Device)  Type 2 diabetes mellitus with hyperglycemia, with long-term current use of insulin (HCC) -     Hemoglobin A1c -     Cancel: POCT Urinalysis DIP (Proadvantage Device) -     dapagliflozin propanediol (FARXIGA) 5 MG TABS tablet; Take 1 tablet (5 mg total) by mouth daily.  Cataract, unspecified cataract type, unspecified laterality  Other orders -     metoprolol tartrate (LOPRESSOR) 25 MG tablet; Take 1 tablet (25 mg total) by mouth 2 (two) times daily. Take 25 mg by mouth 2 (two) times daily. -     potassium chloride (KLOR-CON) 10 MEQ tablet; Take 2 tablets (20 mEq total) by mouth daily.     Follow-up pending labs, yearly for physical

## 2022-09-26 ENCOUNTER — Other Ambulatory Visit: Payer: Self-pay | Admitting: Medical

## 2022-09-26 MED ORDER — ESTRADIOL 0.1 MG/GM VA CREA: TOPICAL_CREAM | VAGINAL | 2 refills | Status: DC

## 2022-09-26 MED ORDER — TRESIBA FLEXTOUCH 100 UNIT/ML ~~LOC~~ SOPN: 15.0000 [IU] | PEN_INJECTOR | Freq: Every day | SUBCUTANEOUS | 2 refills | Status: DC

## 2022-09-26 NOTE — Progress Notes (Signed)
Results sent through My Chart  Follow-up for 6 months med check or physical

## 2022-10-06 DIAGNOSIS — H04123 Dry eye syndrome of bilateral lacrimal glands: Secondary | ICD-10-CM | POA: Diagnosis not present

## 2022-10-06 DIAGNOSIS — H35013 Changes in retinal vascular appearance, bilateral: Secondary | ICD-10-CM | POA: Diagnosis not present

## 2022-10-06 DIAGNOSIS — H34232 Retinal artery branch occlusion, left eye: Secondary | ICD-10-CM | POA: Diagnosis not present

## 2022-10-06 DIAGNOSIS — H524 Presbyopia: Secondary | ICD-10-CM | POA: Diagnosis not present

## 2022-10-06 DIAGNOSIS — E113393 Type 2 diabetes mellitus with moderate nonproliferative diabetic retinopathy without macular edema, bilateral: Secondary | ICD-10-CM | POA: Diagnosis not present

## 2022-10-06 DIAGNOSIS — H35033 Hypertensive retinopathy, bilateral: Secondary | ICD-10-CM | POA: Diagnosis not present

## 2022-10-13 ENCOUNTER — Other Ambulatory Visit: Payer: Self-pay | Admitting: Medical

## 2022-10-17 ENCOUNTER — Encounter: Payer: Self-pay | Admitting: Internal Medicine

## 2022-10-17 ENCOUNTER — Ambulatory Visit: Payer: PPO | Attending: Internal Medicine | Admitting: Internal Medicine

## 2022-10-17 VITALS — BP 116/58 | HR 82 | Ht 63.0 in | Wt 146.0 lb

## 2022-10-17 DIAGNOSIS — I2721 Secondary pulmonary arterial hypertension: Secondary | ICD-10-CM

## 2022-10-17 DIAGNOSIS — I2510 Atherosclerotic heart disease of native coronary artery without angina pectoris: Secondary | ICD-10-CM

## 2022-10-17 DIAGNOSIS — I272 Pulmonary hypertension, unspecified: Secondary | ICD-10-CM

## 2022-10-17 MED ORDER — FUROSEMIDE 20 MG PO TABS
60.0000 mg | ORAL_TABLET | Freq: Every day | ORAL | 3 refills | Status: DC
Start: 2022-10-17 — End: 2023-02-12

## 2022-10-17 NOTE — Progress Notes (Signed)
Cardiology Office Note:    Date:  10/17/2022   ID:  Jessica, Hart [birth year], MRN [id number]  PCP:  Jac Canavan, PA-C  Cardiologist:  Parke Poisson, MD  Electrophysiologist:  None   Referring MD: Jac Canavan, PA-C   Chief Complaint/Reason for Referral: CAD s/p CABG, chronic respiratory failure and pulmonary HTN  History of Present Illness:    Jessica Hart is a 82 y.o. female with a history of diabetes mellitus type 2, hypertension, hyperlipidemia, GERD, remote tobacco abuse who presents for follow-up after CABG x3 LIMA to LAD, reverse saphenous vein graft to PDA and OM1.  She was initially referred for coronary angiography given progressive chest pain and infarct seen on stress test inferior and inferolateral with nuclear stress EF of 36%. Coronary angiography showed severe three-vessel coronary artery obstructive disease with severe three-vessel calcification. Coronary artery bypass grafting performed on 09/02/2019.  Subsequently diagnosed with pulmonary hypertension primarily group 3.  10/17/22: She is in excellent spirits today and feels much better on current pulmonary hypertension regimen of Opsumit 10 mg daily and tadalafil 40 mg daily.  She feels she could do more of what she likes to do if she had a portable oxygen backpack, she will discuss this with Dr. Judeth Horn.  She is return to several of her usual activities and endorses stable dyspnea while wearing oxygen.  No chest discomfort and no difficulty with other medications.  Continues to take Lasix daily.  No lower extremity edema noted today.  Prior visits:  She reports that she is feeling much better since her last visit with Dr. Judeth Horn in pulmonary medicine and initiation of tadalafil uptitrated to 40 mg daily.  She still experiences shortness of breath with exertion.  Currently using 4 L of oxygen and does still endorse hypoxia. She is currently taking tadalafil 40 mg per day.  Plan per pulmonary  is to potentially begin an ERA at her next visit.  This may continue to help her symptomatically.  She is having to use supplemental oxygen constantly and has been using 4 l/m. She is having some issues with epistaxis due to the dryness, but not significant. They did not give her humidified oxygen with her home supply. She is trying to keep her oxygen at 90% as recommended by pulmonary medicine.  She wants to get some energy back so she can spend more time with her grandchildren and great grandchildren.  She had a very close friend who passed away 4 years ago and since then feels she has been more sedentary.  She states that they have been trying to get home healthcare, but is having some issues with her insurance.  Endorses mild pedal edema.  She does have a stationary bike that she has been trying to use and feels it may be helping.   We reviewed her medications to make sure that the instructions were clear.  She was previously prescribed metoprolol tartrate 37.5 mg twice daily, but her medication administration record suggests 25 mg twice daily.  She brings both bottles today.  Based on current vital signs and symptoms, reasonable to continue on metoprolol tartrate 25 mg twice daily.  She denies any palpitations or chest pain. No lightheadedness, headaches, syncope, orthopnea, or PND.      Past Medical History:  Diagnosis Date   Arthritis    Cataract bilaterl 3 years ago   Coronary artery disease    Diabetes mellitus 02/27/2005   Diabetes mellitus type 2, controlled, without complications (  HCC) 04/26/2006   Qualifier: Diagnosis of  By: Haydee Salter     Diverticulosis    Dyspnea    Echocardiogram findings abnormal, without diagnosis 2005   EF 47%, no ischemia, no infarction   Former smoker 01/10/2017   45 pack year history, quit in 2007    GERD (gastroesophageal reflux disease)    Heart murmur    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   History of bone density study 2006   Lt hip = -1.0, L spine  = -1.8    Hyperlipidemia    Hypertension     Past Surgical History:  Procedure Laterality Date   ABDOMINAL HYSTERECTOMY  1989   one ovary remains per patient   CARDIAC CATHETERIZATION  08/20/2019   COLONOSCOPY     CORONARY ARTERY BYPASS GRAFT N/A 09/02/2019   Procedure: CORONARY ARTERY BYPASS GRAFTING (CABG) TIMES THREE USING LEFT INTERNAL MAMMARY ARTERY AND RIGHT GREATER SAPHENOUS VEIN;  Surgeon: Corliss Skains, MD;  Location: [facility] OR;  Service: Open Heart Surgery;  Laterality: N/A;   ENDOVEIN HARVEST OF GREATER SAPHENOUS VEIN Right 09/02/2019   Procedure: ENDOVEIN HARVEST OF GREATER SAPHENOUS VEIN;  Surgeon: Corliss Skains, MD;  Location: [facility] OR;  Service: Open Heart Surgery;  Laterality: Right;   EYE SURGERY Bilateral    CATARACT   LEFT HEART CATH AND CORONARY ANGIOGRAPHY N/A 08/20/2019   Procedure: LEFT HEART CATH AND CORONARY ANGIOGRAPHY;  Surgeon: Lyn Records, MD;  Location: [facility] INVASIVE [facility];  Service: Cardiovascular;  Laterality: N/A;   RIGHT AND LEFT HEART CATH N/A 07/13/2021   Procedure: RIGHT AND LEFT HEART CATH;  Surgeon: Orbie Pyo, MD;  Location: [facility] INVAS[facility];  Service: Cardiovascular;  Laterality: N/A;   TEE WITHOUT CARDIOVERSION N/A 09/02/2019   Procedure: TRANSESOPHAGEAL ECHOCARDIOGRAM (TEE);  Surgeon: Corliss Skains, MD;  Location: [facility] OR;  Service: Open Heart Surgery;  Laterality: N/A;   TONSILECTOMY, ADENOIDECTOMY, BILATERAL MYRINGOTOMY AND TUBES  1965   TONSILLECTOMY      Current Medications: Current Meds  Medication Sig   acetaminophen (TYLENOL) 500 MG tablet Take 2 tablets (1,000 mg total) by mouth every 8 (eight) hours as needed.   aspirin EC 81 MG tablet Take 1 tablet (81 mg total) by mouth daily.   Blood Glucose Monitoring Suppl DEVI Test 1-2 times day. Pt needs onetouch meter   Cholecalciferol (D3 5000) 125 MCG (5000 UT) capsule Take 5,000 Units by mouth daily.   CREON 36000-114000 units CPEP capsule TAKE 2 CAPSULES BY MOUTH 3 TIMES A  DAY WITH MEALS AND TAKE 1 CAPSULE WITH EACH SNACK TWICE DAILY   dapagliflozin propanediol (FARXIGA) 5 MG TABS tablet Take 1 tablet (5 mg total) by mouth daily.   estradiol (ESTRACE) 0.1 MG/GM vaginal cream Pea size amount per vagina 1-2 times per week   Fluticasone-Umeclidin-Vilant (TRELEGY ELLIPTA) 100-62.5-25 MCG/ACT AEPB Inhale 1 puff into the lungs daily. TAKE 1 PUFF BY MOUTH EVERY DAY Strength: 100-62.5-25 MCG/ACT   Glucose Blood (BLOOD GLUCOSE TEST STRIPS) STRP Test 1-2 times daily. Needs onetouch strips   insulin degludec (TRESIBA FLEXTOUCH) 100 UNIT/ML FlexTouch Pen Inject 15 Units into the skin daily after supper.   Lancet Device MISC Use device to test bloodsugar with lancet   Lancets Misc. MISC 1-2 times a day. Needs one touch lancets   losartan (COZAAR) 50 MG tablet Take 1 tablet (50 mg total) by mouth daily.   macitentan (OPSUMIT) 10 MG tablet Take 1 tablet (10 mg total) by mouth daily.  metFORMIN (GLUCOPHAGE-XR) 750 MG 24 hr tablet TAKE 1 TABLET BY MOUTH EVERY DAY WITH BREAKFAST   metoprolol tartrate (LOPRESSOR) 25 MG tablet Take 1 tablet (25 mg total) by mouth 2 (two) times daily. Take 25 mg by mouth 2 (two) times daily.   Misc. Devices MISC SIG: 1 L of Oxygen via Nasal cannula around the clock Dispense Home Oxygen and Portable tank and necessary supplies. Dx: Hypoxemia, Shortness of breath (Patient taking differently: Place 4-4.5 L into the nose See admin instructions. 4 - 4.5 L of Oxygen via Nasal cannula around the clock Dispense Home Oxygen and Portable tank and necessary supplies. Dx: Hypoxemia, Shortness of breath)   Multiple Vitamins-Minerals (NO IRON MULT VITAMIN-MINERALS) TABS TAKE 1 TABLET BY MOUTH DAILY (Patient taking differently: Take 1 tablet by mouth daily.)   omeprazole (PRILOSEC) 40 MG capsule TAKE 1 CAPSULE (40 MG TOTAL) BY MOUTH DAILY.   ONETOUCH VERIO test strip SMARTSIG:1 Each Via Meter Daily   potassium chloride (KLOR-CON) 10 MEQ tablet Take 2 tablets (20 mEq  total) by mouth daily.   rosuvastatin (CRESTOR) 20 MG tablet TAKE 1 TABLET BY MOUTH EVERY DAY   tadalafil, PAH, (ADCIRCA) 20 MG tablet TAKE 2 TABLETS BY MOUTH 1 TIME A DAY.   vitamin B-12 (CYANOCOBALAMIN) 500 MCG tablet Take 500 mcg by mouth daily.   [DISCONTINUED] furosemide (LASIX) 20 MG tablet Take 3 tablets (60 mg total) by mouth daily.     Allergies:   Lisinopril   Social History   Tobacco Use   Smoking status: Former    Current packs/day: 0.00    Average packs/day: 1 pack/day for 45.0 years (45.0 ttl pk-yrs)    Types: Cigarettes    Start date: 07/27/1960    Quit date: 07/27/2005    Years since quitting: 17.2   Smokeless tobacco: Former    Quit date: 03/2005  Vaping Use   Vaping status: Never Used  Substance Use Topics   Alcohol use: Not Currently    Comment: beer occasionally    Drug use: No     Family History: The patient's family history includes Cancer in her sister; Heart disease in her father; Tongue cancer in her sister. There is no history of Colon polyps, Rectal cancer, or Stomach cancer.  ROS:   Please see the history of present illness.      All other systems reviewed and are negative.  EKGs/Labs/Other Studies Reviewed:    The following studies were reviewed today:  Upper Venous Doppler 07/14/21:  Summary:     Right:  There is no DVT noted in the right IJV or subclavian vein. 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 Unable to locate the neck of the pseudoaneurysm secondary to breathing and movement.     Left:  No DVT noted in the IJV or the subclavian vein.   Right and Left Heart Cath 07/13/21:  Conclusion:  Right heart catheterization with pulmonary arterial hypertension with a mean pulmonary artery pressure of 44 mmHg and a pulmonary vascular resistance of approximately 10 Wood units.   Chest CT 05/19/21 1. The appearance of the lungs is compatible with  interstitial lung disease, with a spectrum of findings categorized as probable usual interstitial pneumonia (UIP) per current ATS guidelines. Outpatient referral to Pulmonology for further clinical evaluation is recommended. 2. Mild dilatation of the pulmonic trunk (3.4 cm in diameter), concerning for associated pulmonary arterial hypertension. 3. Aortic atherosclerosis,  in addition to left main and three-vessel coronary artery disease. Status post median sternotomy for CABG including LIMA to the LAD. 4. Cardiomegaly with right atrial and right ventricular dilatation. 5. Mild paraseptal emphysema.   Aortic Atherosclerosis (ICD10-I70.0) and Emphysema (ICD10-J43.9).  Echo 03/08/21  1. Left ventricular ejection fraction, by estimation, is 65 to 70%. The left ventricle has normal function. The left ventricle has no regional wall motion abnormalities. There is moderate left ventricular hypertrophy.  Left ventricular diastolic parameters are consistent with Grade I diastolic dysfunction (impaired relaxation). Elevated left atrial pressure. There is the interventricular septum is flattened in systole and diastole, consistent with right  ventricular pressure and volume overload. The average left ventricular global longitudinal strain is -18.2 %. The global longitudinal strain is normal.   2. Right ventricular systolic function is mildly reduced. The right ventricular size is moderately enlarged. There is severely elevated pulmonary artery systolic pressure. The estimated right ventricular systolic pressure is 66.7 mmHg.   3. Right atrial size was mildly dilated.   4. The mitral valve is normal in structure. Trivial mitral valve  regurgitation. No evidence of mitral stenosis. Moderate mitral annular calcification.   5. Tricuspid valve regurgitation is moderate.   6. The aortic valve is calcified. There is moderate calcification of the aortic valve. There is severe thickening of the aortic valve. Aortic valve  regurgitation is not visualized. Aortic valve sclerosis/calcification is present, without any evidence of  aortic stenosis.   7. The inferior vena cava is normal in size with greater than 50% respiratory variability, suggesting right atrial pressure of 3 mmHg.   EKG:  EKG is personally reviewed EKG Interpretation Date/Time:  Tuesday October 17 2022 15:21:21 EDT Ventricular Rate:  82 PR Interval:  190 QRS Duration:  90 QT Interval:  422 QTC Calculation: 493 R Axis:   88  Text Interpretation: Sinus rhythm with occasional Premature ventricular complexes Possible Inferior infarct , age undetermined When compared with ECG of 28-Sep-2021 21:14, Since last tracing pvcs NOW SEEN Confirmed by Weston Brass ([id number]) on 10/17/2022 4:10:33 PM    04/13/2022: NSR,  rightward axis, inferior and anterior infarct.  11/09/21: No EKG ordered 05/24/21 - NSR, inferior and anterior infarct pattern. 11/04/20 - NSR, inferior and anterior infarct pattern. 05/04/20 - NSR, inferior and anterior infarct pattern.   Recent Labs: 03/06/2022: Pro B Natriuretic peptide (BNP) 1,156.0 09/25/2022: ALT 13; BUN 21; Creatinine, Ser 1.18; Hemoglobin 15.0; Platelets 231; Potassium 4.2; Sodium 137; TSH 1.660  Recent Lipid Panel    Component Value Date/Time   CHOL 97 (L) 05/19/2022 0858   TRIG 79 05/19/2022 0858   HDL 37 (L) 05/19/2022 0858   CHOLHDL 2.6 05/19/2022 0858   CHOLHDL 4.4 08/29/2016 0920   VLDL 28 08/29/2016 0920   LDLCALC 44 05/19/2022 0858   LDLDIRECT 81 03/21/2013 1431    Physical Exam:    VS:  BP (!) 116/58 (BP Location: Left Arm, Patient Position: Sitting, Cuff Size: Normal)   Pulse 82   Ht 5\' 3"  (1.6 m)   Wt 146 lb (66.2 kg)   SpO2 93%   BMI 25.86 kg/m     Wt Readings from Last 5 Encounters:  10/17/22 146 lb (66.2 kg)  09/25/22 144 lb 3.2 oz (65.4 kg)  07/13/22 150 lb (68 kg)  04/17/22 153 lb (69.4 kg)  04/13/22 154 lb 12.8 oz (70.2 kg)    Constitutional: No acute distress Eyes: sclera  non-icteric, normal conjunctiva and lids ENMT: normal dentition, moist mucous membranes Cardiovascular:  regular rhythm, normal rate, 1/6 systolic murmur.  No JVD sitting upright. Respiratory: clear to auscultation bilaterally, using supplemental oxygen GI : normal bowel sounds, soft and nontender. No distention.   MSK: extremities warm, well perfused.  No lower extremity edema NEURO: grossly nonfocal exam, moves all extremities. PSYCH: alert and oriented x 3, normal mood and affect.   ASSESSMENT:    1. Coronary artery disease involving native heart without angina pectoris, unspecified vessel or lesion type   2. Pulmonary artery hypertension (HCC)   3. Pulmonary HTN (HCC)     PLAN:    PHTN Emphysema Dyspnea on exertion Hypoxia  - NYHA II today.  -likely driven by emphysema and group 3 PHTN. Diuresing with lasix 60 mg daily orally, stable dose with minimal lower extremity edema. Continue to follow with pulmonary for chronic hypoxemic respiratory failure.  - no osa on sleep study. Hypoxia noted.  - Right heart catheterization with pulmonary arterial hypertension with a mean pulmonary artery pressure of 44 mmHg and a pulmonary vascular resistance of approximately 10 Wood units. - seen by Dr. Gala Romney AHF and Dr. Judeth Horn in pulmonary medicine.  Currently on tadalafil 40 mg daily and macitentan 10 mg daily, tolerating well with improvement in dyspnea.  She feels great. -Will repeat echocardiogram at next follow-up in 6 months.  Coronary artery disease involving native heart without angina pectoris, unspecified vessel or lesion type S/P CABG x 3 - continue ASA 81 mg daily - continue metoprolol tartrate 25 mg BID - continue rosuvastatin 20 mg daily.  - no chest pain   Essential hypertension - Plan: EKG 12-Lead - BP well controlled.  - continue losartan 50 mg daily.  Blood pressure is low end of normal today but has been normal at other office visits.  If this remains low or if she is  symptomatic, would consider decreasing dose of losartan. - continue metoprolol tartrate 25 mg BID - lasix as above.  Hyperlipidemia associated with type 2 diabetes mellitus (HCC)  - continue crestor 20 mg daily. Offered [id number] previously and she defered.  Type 2 diabetes mellitus with hyperglycemia, with long-term current use of insulin (HCC) - she is on Farxiga, insulin, metformin per PCP  Follow up: 6 months  Total time of encounter: 30 minutes total time of encounter, including 20 minutes spent in face-to-face patient care on the date of this encounter. This time includes coordination of care and counseling regarding above mentioned problem list. Remainder of non-face-to-face time involved reviewing chart documents/testing relevant to the patient encounter and documentation in the medical record. I have independently reviewed documentation from referring provider.   Weston Brass, MD, [facility]  [facility]   Medication Adjustments/Labs and Tests Ordered: Current medicines are reviewed at length with the patient today.  Concerns regarding medicines are outlined above.   Orders Placed This Encounter  Procedures   EKG 12-Lead   ECHOCARDIOGRAM COMPLETE   ECHOCARDIOGRAM COMPLETE     Meds ordered this encounter  Medications   furosemide (LASIX) 20 MG tablet    Sig: Take 3 tablets (60 mg total) by mouth daily.    Dispense:  90 tablet    Refill:  3     Patient Instructions  Medication Instructions:  Your physician recommends that you continue on your current medications as directed. Please refer to the Current Medication list given to you today.  *If you need a refill on your cardiac medications before your next appointment, please call your pharmacy*    Testing/Procedures:  Echo will be scheduled at [address].  Your physician has requested that you have an echocardiogram. Echocardiography is a painless test that uses sound waves to create  images of your heart. It provides your doctor with information about the size and shape of your heart and how well your heart's chambers and valves are working. This procedure takes approximately one hour. There are no restrictions for this procedure. Please do NOT wear cologne, perfume, aftershave, or lotions (deodorant is allowed). Please arrive 15 minutes prior to your appointment time.    Follow-Up: At [facility], you and your health needs are our priority.  As part of our continuing mission to provide you with exceptional heart care, we have created designated Provider Care Teams.  These Care Teams include your primary Cardiologist (physician) and Advanced Practice Providers (APPs -  Physician Assistants and Nurse Practitioners) who all work together to provide you with the care you need, when you need it.  We recommend signing up for the patient portal called "MyChart".  Sign up information is provided on this After Visit Summary.  MyChart is used to connect with patients for Virtual Visits (Telemedicine).  Patients are able to view lab/test results, encounter notes, upcoming appointments, etc.  Non-urgent messages can be sent to your provider as well.   To learn more about what you can do with MyChart, go to [email or web address].    Your next appointment:   6 month(s): call in November to schedule next appointment   Provider:   Parke Poisson, MD

## 2022-10-17 NOTE — Patient Instructions (Addendum)
Medication Instructions:  Your physician recommends that you continue on your current medications as directed. Please refer to the Current Medication list given to you today.  *If you need a refill on your cardiac medications before your next appointment, please call your pharmacy*    Testing/Procedures: Echo will be scheduled at [address] [facility] [facility] [address].  Your physician has requested that you have an echocardiogram. Echocardiography is a painless test that uses sound waves to create images of your heart. It provides your doctor with information about the size and shape of your heart and how well your heart's chambers and valves are working. This procedure takes approximately one hour. There are no restrictions for this procedure. Please do NOT wear cologne, perfume, aftershave, or lotions (deodorant is allowed). Please arrive 15 minutes prior to your appointment time.    Follow-Up: At [facility], you and your health needs are our priority.  As part of our continuing mission to provide you with exceptional heart care, we have created designated Provider Care Teams.  These Care Teams include your primary Cardiologist (physician) and Advanced Practice Providers (APPs -  Physician Assistants and Nurse Practitioners) who all work together to provide you with the care you need, when you need it.  We recommend signing up for the patient portal called "MyChart".  Sign up information is provided on this After Visit Summary.  MyChart is used to connect with patients for Virtual Visits (Telemedicine).  Patients are able to view lab/test results, encounter notes, upcoming appointments, etc.  Non-urgent messages can be sent to your provider as well.   To learn more about what you can do with MyChart, go to [email or web address].    Your next appointment:   6 month(s): call in November to schedule next appointment   Provider:   Parke Poisson, MD

## 2022-10-20 NOTE — Telephone Encounter (Signed)
S 

## 2022-10-25 ENCOUNTER — Telehealth: Payer: Self-pay | Admitting: Medical

## 2022-10-25 NOTE — Telephone Encounter (Signed)
Call [facility] [phone] I received a medical clearance for dental work.  Are they doing general anesthesia or local anesthesia?  Are they planning to do any conscious sedation?  That will dictate how I complete the form  She has numerous health conditions including pulmonary hypertension and on oxygen therapy

## 2022-10-25 NOTE — Telephone Encounter (Signed)
They are doing local anesthesia as she has 6 teeth being extracted

## 2022-10-26 ENCOUNTER — Telehealth: Payer: Self-pay | Admitting: Medical

## 2022-10-26 DIAGNOSIS — I2699 Other pulmonary embolism without acute cor pulmonale: Secondary | ICD-10-CM | POA: Diagnosis not present

## 2022-10-26 NOTE — Telephone Encounter (Signed)
Print a copy of her problem list and medications and send along with a copy of the dental clearance.  The clearance only applies to local anesthesia.  General anesthesia or conscious sedation  would  need to be at the hospital under the guidance of pulmonology and cardiology given her heart and lung issues

## 2022-10-26 NOTE — Telephone Encounter (Signed)
Printed and sent upfront to fax back

## 2022-11-10 ENCOUNTER — Other Ambulatory Visit: Payer: Self-pay | Admitting: Medical

## 2022-11-10 ENCOUNTER — Other Ambulatory Visit: Payer: Self-pay | Admitting: Internal Medicine

## 2022-11-10 DIAGNOSIS — I10 Essential (primary) hypertension: Secondary | ICD-10-CM

## 2022-11-10 DIAGNOSIS — I272 Pulmonary hypertension, unspecified: Secondary | ICD-10-CM

## 2022-11-13 NOTE — Telephone Encounter (Signed)
This was refilled in may 2024 for 6 months. Pt just got a refill for 3 months in August

## 2022-11-14 ENCOUNTER — Ambulatory Visit (HOSPITAL_COMMUNITY): Payer: PPO

## 2022-11-16 ENCOUNTER — Ambulatory Visit (INDEPENDENT_AMBULATORY_CARE_PROVIDER_SITE_OTHER): Payer: PPO | Admitting: Podiatry

## 2022-11-16 DIAGNOSIS — E1142 Type 2 diabetes mellitus with diabetic polyneuropathy: Secondary | ICD-10-CM | POA: Diagnosis not present

## 2022-11-16 DIAGNOSIS — M79675 Pain in left toe(s): Secondary | ICD-10-CM | POA: Diagnosis not present

## 2022-11-16 DIAGNOSIS — M79674 Pain in right toe(s): Secondary | ICD-10-CM | POA: Diagnosis not present

## 2022-11-16 DIAGNOSIS — L84 Corns and callosities: Secondary | ICD-10-CM | POA: Diagnosis not present

## 2022-11-16 DIAGNOSIS — B351 Tinea unguium: Secondary | ICD-10-CM

## 2022-11-16 NOTE — Progress Notes (Signed)
Subjective:  Patient ID: Jessica Hart, female    DOB: April 26, 1940,  MRN: [id number]  Chief Complaint  Patient presents with   Nail Problem    Diabetic Foot Care-nail trim    Callouses    Callus trim to left foot.     82 y.o. female presents with the above complaint. History confirmed with patient. Patient presenting with pain related to dystrophic thickened elongated nails. Patient is unable to trim own nails related to nail dystrophy and/or mobility issues. Patient does have a history of T2DM. Patient does have callus present located at the left plantar foot sub 2nd and 5th met head causing pain.  Patient does have some level of numbness in the feet however she says it is not pronounced.  She does take insulin.    Objective:  Physical Exam: warm, good capillary refill nail exam onychomycosis of the toenails, onycholysis, and dystrophic nails protective sensation absent DP pulses are very weakly palpable nonpalpable deep PT pulses cap refill intact all toes. Left Foot:  Pain with palpation of nails due to elongation and dystrophic growth.  Right Foot: Pain with palpation of nails due to elongation and dystrophic growth.   Assessment:   1. Pre-ulcerative calluses   2. Pain due to onychomycosis of toenails of both feet   3. DM type 2 with diabetic peripheral neuropathy (HCC)        Plan:  Patient was evaluated and treated and all questions answered.  #Hyperkeratotic lesions/pre ulcerative calluses present subsecond metatarsal head and subfifth metatarsal head on the left foot All symptomatic hyperkeratoses x 2 separate lesions were safely debrided with a sterile #10 blade to patient's level of comfort without incident. We discussed preventative and palliative care of these lesions including supportive and accommodative shoegear, padding, prefabricated and custom molded accommodative orthoses, use of a pumice stone and lotions/creams daily.  #Onychomycosis with pain  -Nails  palliatively debrided as below. -Educated on self-care  Procedure: Nail Debridement Rationale: Pain Type of Debridement: manual, sharp debridement. Instrumentation: Nail nipper, rotary burr. Number of Nails: 10  Return in about 3 months (around 02/15/2023) for [facility].         Corinna Gab, DPM [facility] / [facility]

## 2022-11-21 ENCOUNTER — Telehealth: Payer: Self-pay

## 2022-11-21 ENCOUNTER — Encounter: Payer: Self-pay | Admitting: Pulmonary Disease

## 2022-11-21 ENCOUNTER — Ambulatory Visit: Payer: PPO | Admitting: Pulmonary Disease

## 2022-11-21 VITALS — BP 130/80 | HR 77 | Ht 63.0 in | Wt 148.8 lb

## 2022-11-21 DIAGNOSIS — I270 Primary pulmonary hypertension: Secondary | ICD-10-CM | POA: Diagnosis not present

## 2022-11-21 NOTE — Telephone Encounter (Signed)
Per Dr. Jacques Navy, pt was seen on 10/17/22, pt was told to repeat echo in February of 2025 and have return office visit shortly after. Looks like pt was scheduled for next available echo. Spoke with pt regarding this, she is ok with moving echo to February. Also, scheduled office visit with pt to see Dr. Jacques Navy. Pt verbalizes understanding.

## 2022-11-21 NOTE — Patient Instructions (Addendum)
I am glad that the medication is helping  No changes to medications  We will get lab work today  Return to clinic in 4 months or sooner as needed with Dr. Judeth Horn

## 2022-11-21 NOTE — Progress Notes (Signed)
@Patient  ID: Jessica Hart, female    DOB: 1940/11/28, 82 y.o.   MRN: [id number]  Chief Complaint  Patient presents with   Follow-up    Pt is here for F/U visit. Pt request Oxygen Backpack.     Referring provider: Genia Del  HPI:   82 y.o. woman whom we are seeing in follow up for volume overload and diagnosis of pulmonary HTN on RHC.  Patient presents for routine follow-up.  Previously on tadalafil.  Preceding TTE prior to last visit there had worsening findings on the right side.  After shared decision making we decided to add Opsumit.  Felt like Tyvaso was too frequent of administration, she would be able to do so.  With Opsumit she reports interval improvement again.  Less lower extremity swelling.  Overall weight is down a couple pounds at last visit.  More energy.  Doing more activities.  Overall doing well.  HPI at initial visit: Was in usual state of health.  Was seen PCP and routine follow-up.  Noted to be hypoxemic work-up, rooming.  Noted little bit of cough for 3 days.  She was placed on 1 L of oxygen.  Came up to 92%.  She was placed on Trelegy.  She was referred to pulmonary.  Home oxygen was delivered.  She represented to PCP about 10 days later.  Noted to be at 87% on 1 L with exertion although no changes were made to oxygen prescription.  She denies significant dyspnea, nothing too significant.  Able to do tasks he needs to do.  Reviewed multiple CT scans that shows mild emphysematous changes primarily in the upper lobes with interlobular septal thickening and bronchiectasis in bilateral lower lobes without clear honeycombing on my interpretation.  PMH: Hypertension, diabetes, hyperlipidemia, tobacco abuse in remission surgical history: Colon surgery, hysterectomy Family history: CAD in first relatives, no significant [name] is a 82 year old with Social history: Former smoker, 45-pack-year, quit 2008, retired, lives with husband  Public affairs consultant / Pulmonary Flowsheets:   ACT:      No data to display          MMRC:     No data to display          Epworth:      No data to display          Tests:   FENO:  No results found for: "NITRICOXIDE"  PFT:    Latest Ref Rng & Units 04/06/2021    3:35 PM  PFT Results  FVC-Pre L 1.74   FVC-Predicted Pre % 89   FVC-Post L 1.64   FVC-Predicted Post % 84   Pre FEV1/FVC % % 83   Post FEV1/FCV % % 84   FEV1-Pre L 1.44   FEV1-Predicted Pre % 96   FEV1-Post L 1.37   DLCO uncorrected ml/min/mmHg 6.61   DLCO UNC% % 36   DLVA Predicted % 55   TLC L 3.72   TLC % Predicted % 75   RV % Predicted % 60   Personally reviewed interpret spirometry suggestive of moderate to mild restriction versus gas trapping, no bronchodilator response, no fixed obstruction, TLC moderately reduced, DLCO severely reduced  WALK:      No data to display          Imaging: Personally reviewed and as per EMR discussion of this note No results found.  Lab Results: Personally reviewed, no anemia, no polycythemia CBC    Component Value  Date/Time   WBC 7.6 09/25/2022 1047   WBC 8.2 09/29/2021 0427   RBC 5.08 09/25/2022 1047   RBC 5.41 (H) 09/29/2021 0427   HGB 15.0 09/25/2022 1047   HCT 44.7 09/25/2022 1047   PLT 231 09/25/2022 1047   MCV 88 09/25/2022 1047   MCH 29.5 09/25/2022 1047   MCH 28.3 09/29/2021 0427   MCHC 33.6 09/25/2022 1047   MCHC 33.0 09/29/2021 0427   RDW 14.6 09/25/2022 1047   LYMPHSABS 1.0 09/25/2022 1047   MONOABS 0.4 09/28/2021 2133   EOSABS 0.1 09/25/2022 1047   BASOSABS 0.1 09/25/2022 1047    BMET    Component Value Date/Time   NA 137 09/25/2022 1047   K 4.2 09/25/2022 1047   CL 100 09/25/2022 1047   CO2 21 09/25/2022 1047   GLUCOSE 200 (H) 09/25/2022 1047   GLUCOSE 106 (H) 09/29/2021 0427   BUN 21 09/25/2022 1047   CREATININE 1.18 (H) 09/25/2022 1047   CREATININE 0.69 01/10/2017 1235   CALCIUM 10.0 09/25/2022 1047    GFRNONAA 51 (L) 09/29/2021 0427   GFRNONAA 86 01/10/2017 1235   GFRAA 97 02/02/2020 1108   GFRAA 99 01/10/2017 1235    BNP    Component Value Date/Time   BNP 833.9 (H) 10/10/2021 1321   BNP 1,007.8 (H) 09/28/2021 2133    ProBNP    Component Value Date/Time   PROBNP 1,156.0 (H) 03/06/2022 1552    Specialty Problems       Pulmonary Problems   SOB (shortness of breath)   Pulmonary emphysema (HCC)   Pulmonary nodules   Chronic respiratory failure with hypoxia (HCC)    Allergies  Allergen Reactions   Lisinopril Itching and Cough    Immunization History  Administered Date(s) Administered   Fluad Quad(high Dose 65+) 11/11/2018   Influenza Whole 12/11/2007   Influenza, High Dose Seasonal PF 02/29/2016, 01/10/2017, 11/15/2017   Influenza,inj,Quad PF,6+ Mos 10/29/2012, 04/06/2014, 02/19/2015   PFIZER(Purple Top)SARS-COV-2 Vaccination 04/22/2019, 05/13/2019, 01/30/2020   Pneumococcal Conjugate-13 04/06/2014   Pneumococcal Polysaccharide-23 12/11/2007   Td 05/28/2004   Zoster Recombinant(Shingrix) 09/30/2022    Past Medical History:  Diagnosis Date   Arthritis    Cataract bilaterl 3 years ago   Coronary artery disease    Diabetes mellitus 02/27/2005   Diabetes mellitus type 2, controlled, without complications (HCC) 04/26/2006   Qualifier: Diagnosis of  By: Haydee Salter     Diverticulosis    Dyspnea    Echocardiogram findings abnormal, without diagnosis 2005   EF 47%, no ischemia, no infarction   Former smoker 01/10/2017   45 pack year history, quit in 2007    GERD (gastroesophageal reflux disease)    Heart murmur    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   History of bone density study 2006   Lt hip = -1.0, L spine = -1.8    Hyperlipidemia    Hypertension     Tobacco History: Social History   Tobacco Use  Smoking Status Former   Current packs/day: 0.00   Average packs/day: 1 pack/day for 45.0 years (45.0 ttl pk-yrs)   Types: Cigarettes   Start date: 07/27/1960    Quit date: 07/27/2005   Years since quitting: 17.3  Smokeless Tobacco Former   Quit date: 03/2005   Counseling given: Not Answered   Continue to not smoke  Outpatient Encounter Medications as of 11/21/2022  Medication Sig   acetaminophen (TYLENOL) 500 MG tablet Take 2 tablets (1,000 mg total) by mouth every 8 (eight) hours as needed.  aspirin EC 81 MG tablet Take 1 tablet (81 mg total) by mouth daily.   Blood Glucose Monitoring Suppl DEVI Test 1-2 times day. Pt needs onetouch meter   Cholecalciferol (D3 5000) 125 MCG (5000 UT) capsule Take 5,000 Units by mouth daily.   CREON 36000-114000 units CPEP capsule TAKE 2 CAPSULES BY MOUTH 3 TIMES A DAY WITH MEALS AND TAKE 1 CAPSULE WITH EACH SNACK TWICE DAILY   dapagliflozin propanediol (FARXIGA) 5 MG TABS tablet Take 1 tablet (5 mg total) by mouth daily.   estradiol (ESTRACE) 0.1 MG/GM vaginal cream Pea size amount per vagina 1-2 times per week   Fluticasone-Umeclidin-Vilant (TRELEGY ELLIPTA) 100-62.5-25 MCG/ACT AEPB Inhale 1 puff into the lungs daily. TAKE 1 PUFF BY MOUTH EVERY DAY Strength: 100-62.5-25 MCG/ACT   furosemide (LASIX) 20 MG tablet Take 3 tablets (60 mg total) by mouth daily.   Glucose Blood (BLOOD GLUCOSE TEST STRIPS) STRP Test 1-2 times daily. Needs onetouch strips   insulin degludec (TRESIBA FLEXTOUCH) 100 UNIT/ML FlexTouch Pen Inject 15 Units into the skin daily after supper.   Lancet Device MISC Use device to test bloodsugar with lancet   Lancets Misc. MISC 1-2 times a day. Needs one touch lancets   losartan (COZAAR) 50 MG tablet Take 1 tablet (50 mg total) by mouth daily.   macitentan (OPSUMIT) 10 MG tablet Take 1 tablet (10 mg total) by mouth daily.   metFORMIN (GLUCOPHAGE-XR) 750 MG 24 hr tablet TAKE 1 TABLET BY MOUTH EVERY DAY WITH BREAKFAST   metoprolol tartrate (LOPRESSOR) 25 MG tablet Take 1 tablet (25 mg total) by mouth 2 (two) times daily. Take 25 mg by mouth 2 (two) times daily.   Misc. Devices MISC SIG: 1 L of  Oxygen via Nasal cannula around the clock Dispense Home Oxygen and Portable tank and necessary supplies. Dx: Hypoxemia, Shortness of breath (Patient taking differently: Place 4-4.5 L into the nose See admin instructions. 4 - 4.5 L of Oxygen via Nasal cannula around the clock Dispense Home Oxygen and Portable tank and necessary supplies. Dx: Hypoxemia, Shortness of breath)   Multiple Vitamins-Minerals (NO IRON MULT VITAMIN-MINERALS) TABS TAKE 1 TABLET BY MOUTH DAILY (Patient taking differently: Take 1 tablet by mouth daily.)   omeprazole (PRILOSEC) 40 MG capsule TAKE 1 CAPSULE (40 MG TOTAL) BY MOUTH DAILY.   ONETOUCH VERIO test strip SMARTSIG:1 Each Via Meter Daily   potassium chloride (KLOR-CON) 10 MEQ tablet Take 2 tablets (20 mEq total) by mouth daily.   rosuvastatin (CRESTOR) 20 MG tablet TAKE 1 TABLET BY MOUTH EVERY DAY   tadalafil, PAH, (ADCIRCA) 20 MG tablet TAKE 2 TABLETS BY MOUTH 1 TIME A DAY.   vitamin B-12 (CYANOCOBALAMIN) 500 MCG tablet Take 500 mcg by mouth daily.   No facility-administered encounter medications on file as of 11/21/2022.     Review of Systems  Review of Systems  N/a Physical Exam  BP 130/80 (BP Location: Left Arm, Cuff Size: Normal)   Pulse 77   Ht 5\' 3"  (1.6 m)   Wt 148 lb 12.8 oz (67.5 kg)   SpO2 96%   BMI 26.36 kg/m   Wt Readings from Last 5 Encounters:  11/21/22 148 lb 12.8 oz (67.5 kg)  10/17/22 146 lb (66.2 kg)  09/25/22 144 lb 3.2 oz (65.4 kg)  07/13/22 150 lb (68 kg)  04/17/22 153 lb (69.4 kg)    BMI Readings from Last 5 Encounters:  11/21/22 26.36 kg/m  10/17/22 25.86 kg/m  09/25/22 25.54 kg/m  07/13/22 26.57  kg/m  04/17/22 27.10 kg/m     Physical Exam General: Well-appearing, no acute distress Eyes: EOMI, no icterus Pulmonary: Clear, normal breathing, no crackles Cardiovascular: Regular rate and rhythm, no murmurs MSK: No synovitis, no joint effusion Neuro: Normal gait, no weakness Psych: Normal mood, full  affect   Assessment & Plan:   Chronic hypoxemic respiratory failure: Suspect related to emphysema 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  Basilar fibrosis does not appear classic for UIP but no other clear pattern.  Possibly due to recurrent aspiration in setting of long history of GERD.  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  Also suspect contribution of VQ mismatch due to recently discovered likely pulmonary hypertension on TTE.  It was reiterated again how she should be using oxygen.  She is to use all times during the day at rest and with exertion.  Stressed keeping oxygen saturation above 90%, ideally greater than 92%.  Continue nocturnal oxygen, 4 L.  She is repeatedly asked repetitively failed trials of POC in the office with ambulation.  POC not an option as it does not keep her oxygen saturation above 88% likely as a result of the pulsed dosing.  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  She has poor insight into this. Consider antifibrotic's but she has declined in the past.  Can reevaluate in the future.    Pulmonary hypertension: Based on TTE with significant findings of RV enlargement, mildly reduced RV function, mildly dilated RA all new compared to 2021.  Confirmed on right heart cath 06/2021 with normal LVEDP, preserved CO and CI.  Given rapid progression of disease group 1 disease is most likely.  Suspect contribution of group 3 disease and hypoxemia and nocturnal hypoxia via as historically she has not used oxygen as prescribed now with better adherence.  No OSA on PSG.  Possible group 2 disease given diastolic dysfunction although not sure how big contributor this is.  No history of VTE/PE to suggest group 4 disease.  Long and frank discussion with patient.  She is not a very good candidate for pulmonary vasodilators given the multifactorial nature of disease, significant emphysema on CT scan, demonstration of some lack of understanding of instructions in the past.  Given  her significant emphysema, inhaled vasodilators have not demonstrated effectiveness in the past trials.  Systemic vasodilators pose significant risk of VQ mismatch.  We did discussed using pulmonary vasodilators both inhaled and oral.  Discussed at length the side effect profile and what to expect.  Discussed risk and benefits in terms of VQ mismatch in setting of parenchymal disease as well possible bilateral pulmonary edema depending on how well left-sided heart handles increased preload.  I think she would really struggle with 4 times daily medication such as Tyvaso.  We discussed at length that I would prefer this agent but given the 4 times daily dosing she does not think this is doable.  Nor does her husband.  Tolerating tadalafil 40 mg daily with improved subjective DOE and appetite, energy.  Opsumit added 5/24 after worsening findings on TTE.  Continue Lasix.  BNP remains historically elevated.  Recheck today.  LFTs for intensive drug monitoring while on ERA.  Return in about 4 months (around 03/23/2023).   Karren Burly, MD 11/21/2022  I spent 42 minutes in the care of the patient including face-to-face visit, review of records, coordination of care.

## 2022-11-22 ENCOUNTER — Ambulatory Visit (HOSPITAL_COMMUNITY): Payer: PPO

## 2022-11-26 DIAGNOSIS — I2699 Other pulmonary embolism without acute cor pulmonale: Secondary | ICD-10-CM | POA: Diagnosis not present

## 2022-11-30 ENCOUNTER — Telehealth: Payer: Self-pay | Admitting: Internal Medicine

## 2022-11-30 ENCOUNTER — Telehealth: Payer: Self-pay

## 2022-11-30 DIAGNOSIS — E1165 Type 2 diabetes mellitus with hyperglycemia: Secondary | ICD-10-CM

## 2022-11-30 NOTE — Telephone Encounter (Signed)
Can you inquire to see if patient will qualify for patient assistance for [place]. Pt came by yesterday for samples and we do not have any.   Martie Lee

## 2022-11-30 NOTE — Progress Notes (Signed)
   Care Guide Note  11/30/2022 Name: ANISIA LEIJA MRN: [id number] DOB: [birth year]  Referred by: Jac Canavan, PA-C Reason for referral : Care Coordination (Outreach to schedule with Pharm d )   NYRA ANSPAUGH is a 82 y.o. year old female who is a primary care patient of Genia Del. Luna Glasgow was referred to the pharmacist for assistance related to DM.    An unsuccessful telephone outreach was attempted today to contact the patient who was referred to the pharmacy team for assistance with medication assistance. Additional attempts will be made to contact the patient.   Penne Lash, RMA Care Guide [facility]  [place], [place] [address] Direct Dial: [phone] [email or web address]

## 2022-11-30 NOTE — Telephone Encounter (Signed)
Sending to Marylene Land to help with Patient Assistance to see if she would qualify

## 2022-12-04 NOTE — Progress Notes (Signed)
   Care Guide Note  12/04/2022 Name: HARMONI LUCUS MRN: [id number] DOB: 1940-05-13  Referred by: Jac Canavan, PA-C Reason for referral : Care Coordination (Outreach to schedule with Pharm d )   KILYN MARAGH is a 82 y.o. year old female who is a primary care patient of Genia Del. Luna Glasgow was referred to the pharmacist for assistance related to DM.    A second unsuccessful telephone outreach was attempted today to contact the patient who was referred to the pharmacy team for assistance with medication assistance. Additional attempts will be made to contact the patient.  Penne Lash, RMA Care Guide [facility]  [place], [place] [address] Direct Dial: [phone] [email or web address]

## 2022-12-12 NOTE — Progress Notes (Signed)
   Care Guide Note  12/12/2022 Name: Jessica Hart MRN: [id number] DOB: September 05, 1940  Referred by: Jac Canavan, PA-C Reason for referral : Care Coordination (Outreach to schedule with Pharm d )   Jessica Hart is a 82 y.o. year old female who is a primary care patient of Genia Del. Luna Glasgow was referred to the pharmacist for assistance related to DM.    A third unsuccessful telephone outreach was attempted today to contact the patient who was referred to the pharmacy team for assistance with medication assistance. The Population Health team is pleased to engage with this patient at any time in the future upon receipt of referral and should he/she be interested in assistance from the [facility] team.   Penne Lash, RMA Care Guide [facility]  [place], [place] [address] Direct Dial: [phone] [email or web address]

## 2022-12-13 ENCOUNTER — Other Ambulatory Visit: Payer: Self-pay | Admitting: Medical

## 2022-12-13 DIAGNOSIS — K219 Gastro-esophageal reflux disease without esophagitis: Secondary | ICD-10-CM

## 2022-12-14 NOTE — Telephone Encounter (Signed)
Pt was notified to contact Amber about patient Assistance

## 2022-12-19 ENCOUNTER — Other Ambulatory Visit: Payer: PPO

## 2022-12-19 NOTE — Progress Notes (Signed)
   12/19/2022  Patient ID: Jessica Hart, female   DOB: 10-Jan-1941, 82 y.o.   MRN: [id number]  Patient referred to me by PCP for medication assistance.  Connected with patient for visit via telephone and reviewed medications. Patient noted that she is having cost concerns with [place], [place], and Tresiba.  Evaluated patient's household size and income and she would qualify for patient assistance for the following: Tresiba through [employer] through [employer] [place] through [place]  Patient prefers to pick up applications in office for signature and review. Documents will be awaiting patient on Thursday 10/24.  Sherrill Raring, PharmD Clinical Pharmacist [phone]

## 2022-12-26 DIAGNOSIS — I2699 Other pulmonary embolism without acute cor pulmonale: Secondary | ICD-10-CM | POA: Diagnosis not present

## 2023-01-01 ENCOUNTER — Telehealth: Payer: Self-pay

## 2023-01-01 NOTE — Progress Notes (Signed)
   01/01/2023  Patient ID: Jessica Hart, female   DOB: 03-05-1940, 82 y.o.   MRN: [id number]  Contacted patient to notify her that [place] assistance through [employer] has been approved through 02/27/24. Had to leave a voice message.  Tresiba application with [employer] still pending.  Sherrill Raring, PharmD Clinical Pharmacist [phone]

## 2023-01-11 ENCOUNTER — Telehealth: Payer: Self-pay

## 2023-01-11 NOTE — Progress Notes (Signed)
   01/11/2023  Patient ID: Jessica Hart, female   DOB: June 26, 1940, 82 y.o.   MRN: [id number]  Contacted patient to let her know that her Evaristo Bury is ready for pickup at the office. Had to leave a voice message.  Sherrill Raring, PharmD Clinical Pharmacist [phone]

## 2023-01-18 ENCOUNTER — Telehealth: Payer: Self-pay | Admitting: Pharmacist

## 2023-01-18 NOTE — Telephone Encounter (Signed)
Received notification from [facility] [employer] regarding a prior authorization for OPSUMIT. Authorization has been APPROVED from 01/18/23 to 01/18/24. Approval letter sent to scan center.  Authorization # [id number] Phone # [phone]  Chesley Mires, PharmD, MPH, BCPS, CPP Clinical Pharmacist (Rheumatology and Pulmonology)

## 2023-01-19 ENCOUNTER — Encounter: Payer: Self-pay | Admitting: Gastroenterology

## 2023-01-19 ENCOUNTER — Ambulatory Visit: Payer: PPO | Admitting: Gastroenterology

## 2023-01-19 VITALS — BP 140/80 | HR 94 | Ht 63.0 in | Wt 147.0 lb

## 2023-01-19 DIAGNOSIS — K8681 Exocrine pancreatic insufficiency: Secondary | ICD-10-CM | POA: Diagnosis not present

## 2023-01-19 DIAGNOSIS — R630 Anorexia: Secondary | ICD-10-CM | POA: Diagnosis not present

## 2023-01-19 DIAGNOSIS — K580 Irritable bowel syndrome with diarrhea: Secondary | ICD-10-CM | POA: Diagnosis not present

## 2023-01-19 DIAGNOSIS — K861 Other chronic pancreatitis: Secondary | ICD-10-CM | POA: Diagnosis not present

## 2023-01-19 DIAGNOSIS — K909 Intestinal malabsorption, unspecified: Secondary | ICD-10-CM

## 2023-01-19 DIAGNOSIS — R634 Abnormal weight loss: Secondary | ICD-10-CM

## 2023-01-19 NOTE — Progress Notes (Signed)
Jessica Hart    [id number]    1940-08-28  Primary Care Physician:Tysinger, Kermit Balo, PA-C  Referring Physician: Jac Canavan, PA-C [address] [place],  [place] [address]   Chief complaint: IBS diarrhea.  Discussed the use of AI scribe software for clinical note transcription with the patient, who gave verbal consent to proceed.  History of Present Illness   The patient, with a history of chronic diarrhea and weight loss, reports an improvement in stool consistency since starting Creon. She notes that she no longer has diarrhea. However, she continues to experience weight loss. The patient's appetite has decreased, but she is still eating three to four times a day.  The patient has been on Creon for less than a month and is still adjusting to the medication. She reports no adverse effects such as stomach pain, blood in the stool, nausea, or vomiting. However, she did mention an isolated incident where she consumed something that disagreed with her, but this is a rare occurrence.  The patient also reports issues with her dentures, which she has had for less than a month. She is still getting used to them and this may be affecting her food intake.  The patient's blood glucose and creatinine levels were slightly elevated in her July labs, but the rest of the labs were reported as fine. She has been taking fewer pills due to smaller portion sizes.          Outpatient Encounter Medications as of 01/19/2023  Medication Sig   acetaminophen (TYLENOL) 500 MG tablet Take 2 tablets (1,000 mg total) by mouth every 8 (eight) hours as needed.   aspirin EC 81 MG tablet Take 1 tablet (81 mg total) by mouth daily.   Blood Glucose Monitoring Suppl DEVI Test 1-2 times day. Pt needs onetouch meter   Cholecalciferol (D3 5000) 125 MCG (5000 UT) capsule Take 5,000 Units by mouth daily.   CREON 36000-114000 units CPEP capsule TAKE 2 CAPSULES BY MOUTH 3 TIMES A DAY WITH MEALS  AND TAKE 1 CAPSULE WITH EACH SNACK TWICE DAILY   dapagliflozin propanediol (FARXIGA) 5 MG TABS tablet Take 1 tablet (5 mg total) by mouth daily.   estradiol (ESTRACE) 0.1 MG/GM vaginal cream Pea size amount per vagina 1-2 times per week   Fluticasone-Umeclidin-Vilant (TRELEGY ELLIPTA) 100-62.5-25 MCG/ACT AEPB Inhale 1 puff into the lungs daily. TAKE 1 PUFF BY MOUTH EVERY DAY Strength: 100-62.5-25 MCG/ACT   furosemide (LASIX) 20 MG tablet Take 3 tablets (60 mg total) by mouth daily.   Glucose Blood (BLOOD GLUCOSE TEST STRIPS) STRP Test 1-2 times daily. Needs onetouch strips   insulin degludec (TRESIBA FLEXTOUCH) 100 UNIT/ML FlexTouch Pen Inject 15 Units into the skin daily after supper.   Lancet Device MISC Use device to test bloodsugar with lancet   Lancets Misc. MISC 1-2 times a day. Needs one touch lancets   losartan (COZAAR) 50 MG tablet Take 1 tablet (50 mg total) by mouth daily.   macitentan (OPSUMIT) 10 MG tablet Take 1 tablet (10 mg total) by mouth daily.   metFORMIN (GLUCOPHAGE-XR) 750 MG 24 hr tablet TAKE 1 TABLET BY MOUTH EVERY DAY WITH BREAKFAST   metoprolol tartrate (LOPRESSOR) 25 MG tablet Take 1 tablet (25 mg total) by mouth 2 (two) times daily. Take 25 mg by mouth 2 (two) times daily.   Misc. Devices MISC SIG: 1 L of Oxygen via Nasal cannula around the clock Dispense Home Oxygen and  Portable tank and necessary supplies. Dx: Hypoxemia, Shortness of breath (Patient taking differently: Place 4-4.5 L into the nose See admin instructions. 4 - 4.5 L of Oxygen via Nasal cannula around the clock Dispense Home Oxygen and Portable tank and necessary supplies. Dx: Hypoxemia, Shortness of breath)   Multiple Vitamins-Minerals (NO IRON MULT VITAMIN-MINERALS) TABS TAKE 1 TABLET BY MOUTH DAILY (Patient taking differently: Take 1 tablet by mouth daily.)   omeprazole (PRILOSEC) 40 MG capsule TAKE 1 CAPSULE (40 MG TOTAL) BY MOUTH DAILY.   ONETOUCH VERIO test strip SMARTSIG:1 Each Via Meter Daily    potassium chloride (KLOR-CON) 10 MEQ tablet Take 2 tablets (20 mEq total) by mouth daily.   rosuvastatin (CRESTOR) 20 MG tablet TAKE 1 TABLET BY MOUTH EVERY DAY   tadalafil, PAH, (ADCIRCA) 20 MG tablet TAKE 2 TABLETS BY MOUTH 1 TIME A DAY.   vitamin B-12 (CYANOCOBALAMIN) 500 MCG tablet Take 500 mcg by mouth daily.   No facility-administered encounter medications on file as of 01/19/2023.    Allergies as of 01/19/2023 - Review Complete 01/19/2023  Allergen Reaction Noted   Lisinopril Itching and Cough 05/26/2009    Past Medical History:  Diagnosis Date   Arthritis    Cataract bilaterl 3 years ago   Coronary artery disease    Diabetes mellitus 02/27/2005   Diabetes mellitus type 2, controlled, without complications (HCC) 04/26/2006   Qualifier: Diagnosis of  By: Haydee Salter     Diverticulosis    Dyspnea    Echocardiogram findings abnormal, without diagnosis 2005   EF 47%, no ischemia, no infarction   Former smoker 01/10/2017   45 pack year history, quit in 2007    GERD (gastroesophageal reflux disease)    Heart murmur    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   History of bone density study 2006   Lt hip = -1.0, L spine = -1.8    Hyperlipidemia    Hypertension     Past Surgical History:  Procedure Laterality Date   ABDOMINAL HYSTERECTOMY  1989   one ovary remains per patient   CARDIAC CATHETERIZATION  08/20/2019   COLONOSCOPY     CORONARY ARTERY BYPASS GRAFT N/A 09/02/2019   Procedure: CORONARY ARTERY BYPASS GRAFTING (CABG) TIMES THREE USING LEFT INTERNAL MAMMARY ARTERY AND RIGHT GREATER SAPHENOUS VEIN;  Surgeon: Corliss Skains, MD;  Location: [facility] OR;  Service: Open Heart Surgery;  Laterality: N/A;   ENDOVEIN HARVEST OF GREATER SAPHENOUS VEIN Right 09/02/2019   Procedure: ENDOVEIN HARVEST OF GREATER SAPHENOUS VEIN;  Surgeon: Corliss Skains, MD;  Location: [facility] OR;  Service: Open Heart Surgery;  Laterality: Right;   EYE SURGERY Bilateral    CATARACT   LEFT HEART CATH AND  CORONARY ANGIOGRAPHY N/A 08/20/2019   Procedure: LEFT HEART CATH AND CORONARY ANGIOGRAPHY;  Surgeon: Lyn Records, MD;  Location: [facility] INVASIVE CV LAB;  Service: Cardiovascular;  Laterality: N/A;   RIGHT AND LEFT HEART CATH N/A 07/13/2021   Procedure: RIGHT AND LEFT HEART CATH;  Surgeon: Orbie Pyo, MD;  Location: [facility] INVASIVE CV LAB;  Service: Cardiovascular;  Laterality: N/A;   TEE WITHOUT CARDIOVERSION N/A 09/02/2019   Procedure: TRANSESOPHAGEAL ECHOCARDIOGRAM (TEE);  Surgeon: Corliss Skains, MD;  Location: [facility] OR;  Service: Open Heart Surgery;  Laterality: N/A;   TONSILECTOMY, ADENOIDECTOMY, BILATERAL MYRINGOTOMY AND TUBES  1965   TONSILLECTOMY      Family History  Problem Relation Age of Onset   Heart disease Father    Cancer Sister  Tongue cancer Sister    Colon polyps Neg Hx    Rectal cancer Neg Hx    Stomach cancer Neg Hx     Social History   Socioeconomic History   Marital status: Married    Spouse name: Richard   Number of children: 1   Years of education: 12   Highest education level: High school graduate  Occupational History   Occupation: Retire-Textile    Employer: RETIRED  Tobacco Use   Smoking status: Former    Current packs/day: 0.00    Average packs/day: 1 pack/day for 45.0 years (45.0 ttl pk-yrs)    Types: Cigarettes    Start date: 07/27/1960    Quit date: 07/27/2005    Years since quitting: 17.4   Smokeless tobacco: Former    Quit date: 03/2005  Vaping Use   Vaping status: Never Used  Substance and Sexual Activity   Alcohol use: Not Currently    Comment: beer occasionally    Drug use: No   Sexual activity: Not Currently    Partners: Male    Comment: 1st intercourse-22, partners- 5, married- 19 yrs   Other Topics Concern   Not on file  Social History Narrative   Health Care POA:    Emergency Contact: husband, Richard Production manager   End of Life Plan:    Who lives with you: Lives with husband   Any pets: gold fish   Diet: Patient has a  varied diet and reports stuggeling with portion size and ice cream.   Exercise: Patient does not have currently exercise routine.   Seatbelts: Patient reports wearing seatbelt when in vehicle.   Sun Exposure/Protection: Patient reports not using sun protection.   Hobbies: Bingo         Social Determinants of Health   Financial Resource Strain: Low Risk  (03/21/2022)   Overall Financial Resource Strain (CARDIA)    Difficulty of Paying Living Expenses: Not hard at all  Food Insecurity: No Food Insecurity (03/21/2022)   Hunger Vital Sign    Worried About Running Out of Food in the Last Year: Never true    Ran Out of Food in the Last Year: Never true  Transportation Needs: No Transportation Needs (03/21/2022)   PRAPARE - Administrator, Civil Service (Medical): No    Lack of Transportation (Non-Medical): No  Physical Activity: Insufficiently Active (09/25/2022)   Exercise Vital Sign    Days of Exercise per Week: 7 days    Minutes of Exercise per Session: 10 min  Stress: No Stress Concern Present (03/21/2022)   [employer] of Occupational Health - Occupational Stress Questionnaire    Feeling of Stress : Not at all  Social Connections: Socially Integrated (09/25/2022)   Social Connection and Isolation Panel [NHANES]    Frequency of Communication with Friends and Family: More than three times a week    Frequency of Social Gatherings with Friends and Family: Once a week    Attends Religious Services: More than 4 times per year    Active Member of [employer] or Organizations: Yes    Attends Banker Meetings: 1 to 4 times per year    Marital Status: Married  Catering manager Violence: Not At Risk (03/21/2022)   Humiliation, Afraid, Rape, and Kick questionnaire    Fear of Current or Ex-Partner: No    Emotionally Abused: No    Physically Abused: No    Sexually Abused: No      Review of systems: All other  review of systems negative except as mentioned in the  HPI.   Physical Exam: Vitals:   01/19/23 0835  BP: (!) 140/80  Pulse: 94   Body mass index is 26.04 kg/m. Gen:      No acute distress HEENT:  sclera anicteric Abd:      soft, non-tender; no palpable masses, no distension Ext:    No edema Neuro: alert and oriented x 3 Psych: normal mood and affect  Data Reviewed:  Reviewed labs, radiology imaging, old records and pertinent past GI work up  Results   LABS Blood glucose: elevated (08/2022) Creatinine: elevated (08/2022)  DIAGNOSTIC Colonoscopy: normal (12/2017)       Assessment and Plan/Recommendations: 82 year old very pleasant female with history of type 2 diabetes, hypertension, CAD, chronic IBS diarrhea and pancreatic insufficiency     Chronic Pancreatitis Improved diarrhea with Creon. Weight loss and decreased appetite. Difficulty adjusting to new dentures. Labs in July were normal except for elevated blood glucose and creatinine. -Continue Creon, adjust dose based on meal size (1 pill with small meals/snacks, 2 pills with larger meals). -Encourage small frequent meals and snacks. -Check blood glucose and creatinine levels at next lab visit.  General Health Maintenance No need for stool test as colonoscopy was done 5 years ago and there are no current issues. -Continue current care plan.       Return in 6 months   The patient was provided an opportunity to ask questions and all were answered. The patient agreed with the plan and demonstrated an understanding of the instructions.  Iona Beard , MD    CC: Tysinger, Kermit Balo, PA-C

## 2023-01-19 NOTE — Patient Instructions (Addendum)
VISIT SUMMARY:  During today's visit, we discussed your chronic pancreatitis and the improvements you've experienced with Creon. While your diarrhea has improved, you are still experiencing weight loss and a decreased appetite. We also talked about your new dentures and how they might be affecting your food intake. Your July lab results showed slightly elevated blood glucose and creatinine levels, but everything else was normal.  YOUR PLAN:  -CHRONIC PANCREATITIS: Chronic pancreatitis is a long-term inflammation of the pancreas that affects its ability to function properly. You have shown improvement in your diarrhea since starting Creon, but you are still experiencing weight loss and decreased appetite. Continue taking Creon, adjusting the dose based on meal size (1 pill with small meals/snacks, 2 pills with larger meals). We encourage you to eat small, frequent meals and snacks. We will check your blood glucose and creatinine levels at your next lab visit.  -GENERAL HEALTH MAINTENANCE: Your overall health maintenance plan remains the same. There is no need for a stool test since your colonoscopy five years ago was normal and you have no current issues. Continue with your current care plan.  INSTRUCTIONS:  Please continue taking Creon as directed, adjusting the dose based on the size of your meals. Make sure to eat small, frequent meals and snacks. We will recheck your blood glucose and creatinine levels at your next lab visit.  Thank you for choosing me and [facility].  Dr.Kavitha Nandigam

## 2023-01-22 ENCOUNTER — Telehealth: Payer: Self-pay | Admitting: *Deleted

## 2023-01-22 NOTE — Telephone Encounter (Addendum)
Received fax today, patient approved for assistance with the cost of Creon through 02/21/2024. Will send approval to be scanned in

## 2023-01-26 DIAGNOSIS — I2699 Other pulmonary embolism without acute cor pulmonale: Secondary | ICD-10-CM | POA: Diagnosis not present

## 2023-01-27 ENCOUNTER — Telehealth: Payer: Self-pay | Admitting: Medical

## 2023-01-27 NOTE — Telephone Encounter (Signed)
Recv'd letter from [employer] pt is approved for 2025

## 2023-01-30 ENCOUNTER — Telehealth: Payer: Self-pay

## 2023-01-30 ENCOUNTER — Encounter: Payer: Self-pay | Admitting: Gastroenterology

## 2023-01-30 NOTE — Telephone Encounter (Signed)
*  Pulm  Pharmacy Patient Advocate Encounter  Received notification from [facility] that Prior Authorization for Tadalafil 20mg  has been APPROVED from 01/19/2023 to 01/19/2024

## 2023-02-02 ENCOUNTER — Other Ambulatory Visit: Payer: Self-pay | Admitting: Pulmonary Disease

## 2023-02-02 DIAGNOSIS — I2721 Secondary pulmonary arterial hypertension: Secondary | ICD-10-CM

## 2023-02-07 ENCOUNTER — Telehealth: Payer: Self-pay | Admitting: *Deleted

## 2023-02-07 NOTE — Telephone Encounter (Signed)
Received fax from [facility] saying they have been unable to reach the patient to schedule a delivery. They are putting her medication on hold. I called her and left a message and the phone number on how to reach them to get her medication delivered.

## 2023-02-12 ENCOUNTER — Other Ambulatory Visit: Payer: Self-pay

## 2023-02-12 ENCOUNTER — Telehealth: Payer: Self-pay | Admitting: Internal Medicine

## 2023-02-12 DIAGNOSIS — I272 Pulmonary hypertension, unspecified: Secondary | ICD-10-CM

## 2023-02-12 MED ORDER — FUROSEMIDE 20 MG PO TABS
60.0000 mg | ORAL_TABLET | Freq: Every day | ORAL | 2 refills | Status: DC
Start: 2023-02-12 — End: 2023-11-05

## 2023-02-12 NOTE — Telephone Encounter (Signed)
*  STAT* If patient is at the pharmacy, call can be transferred to refill team.   1. Which medications need to be refilled? (please list name of each medication and dose if known)   furosemide (LASIX) 20 MG tablet    2. Which pharmacy/location (including street and city if local pharmacy) is medication to be sent to?[id number] - [place], [place] - [address]   3. Do they need a 30 day or 90 day supply? 90 day

## 2023-02-15 ENCOUNTER — Ambulatory Visit: Payer: PPO | Admitting: Podiatry

## 2023-02-23 ENCOUNTER — Ambulatory Visit (INDEPENDENT_AMBULATORY_CARE_PROVIDER_SITE_OTHER): Payer: PPO | Admitting: Podiatry

## 2023-02-23 DIAGNOSIS — M79675 Pain in left toe(s): Secondary | ICD-10-CM | POA: Diagnosis not present

## 2023-02-23 DIAGNOSIS — B351 Tinea unguium: Secondary | ICD-10-CM

## 2023-02-23 DIAGNOSIS — E1142 Type 2 diabetes mellitus with diabetic polyneuropathy: Secondary | ICD-10-CM | POA: Diagnosis not present

## 2023-02-23 DIAGNOSIS — M79674 Pain in right toe(s): Secondary | ICD-10-CM | POA: Diagnosis not present

## 2023-02-23 DIAGNOSIS — L84 Corns and callosities: Secondary | ICD-10-CM

## 2023-02-23 NOTE — Progress Notes (Signed)
  Subjective:  Patient ID: Jessica Hart, female    DOB: [birth year],  MRN: [id number]  Chief Complaint  Patient presents with   DFc    She is here for [facility] and last A1C was around "6.8" she thinks.     82 y.o. female presents with the above complaint. History confirmed with patient. Patient presenting with pain related to dystrophic thickened elongated nails. Patient is unable to trim own nails related to nail dystrophy and/or mobility issues. Patient does have a history of T2DM. Patient does have callus present located at the left plantar foot sub 2nd and 5th met head causing pain.  Patient does have some level of numbness in the feet however she says it is not pronounced.  She does take insulin.  Last A1c approximately 6.8 per patient.  Objective:  Physical Exam: warm to cool, good capillary refill, pedal skin atrophic nail exam onychomycosis of the toenails, onycholysis, and dystrophic nails protective sensation absent DP pulses are very weakly palpable nonpalpable deep PT pulses cap refill intact all toes. Left Foot:  Pain with palpation of nails due to elongation and dystrophic growth.  Right Foot: Pain with palpation of nails due to elongation and dystrophic growth.   Assessment:   1. Pain due to onychomycosis of toenails of both feet   2. DM type 2 with diabetic peripheral neuropathy (HCC)   3. Pre-ulcerative calluses        Plan:  Patient was evaluated and treated and all questions answered.  #Hyperkeratotic lesions/pre ulcerative calluses present subsecond metatarsal head and subfifth metatarsal head on the left foot All symptomatic hyperkeratoses x 2 separate lesions were safely debrided with a sterile #10 blade to patient's level of comfort without incident. We discussed preventative and palliative care of these lesions including supportive and accommodative shoegear, padding, prefabricated and custom molded accommodative orthoses, use of a pumice stone and  lotions/creams daily.  #Onychomycosis with pain  -Nails palliatively debrided as below. -Educated on self-care  Procedure: Nail Debridement Rationale: Pain Type of Debridement: manual, sharp debridement. Instrumentation: Nail nipper, rotary burr. Number of Nails: 10  Patient educated on diabetes. Discussed proper diabetic foot care and discussed risks and complications of disease. Educated patient in depth on reasons to return to the office immediately should he/she discover anything concerning or new on the feet. All questions answered. Discussed proper shoes as well.    Return in about 3 months (around 05/24/2023).         Bronwen Betters, DPM [facility] / [facility]

## 2023-02-25 DIAGNOSIS — I2699 Other pulmonary embolism without acute cor pulmonale: Secondary | ICD-10-CM | POA: Diagnosis not present

## 2023-02-26 ENCOUNTER — Telehealth: Payer: Self-pay | Admitting: Medical

## 2023-02-26 DIAGNOSIS — I10 Essential (primary) hypertension: Secondary | ICD-10-CM

## 2023-02-26 MED ORDER — LOSARTAN POTASSIUM 50 MG PO TABS: 50.0000 mg | ORAL_TABLET | Freq: Every day | ORAL | 1 refills | Status: DC

## 2023-02-26 NOTE — Telephone Encounter (Signed)
Refilled

## 2023-02-26 NOTE — Telephone Encounter (Signed)
Pt needs a refill on losartan to [id number] - [place], [place] - [address]

## 2023-03-09 ENCOUNTER — Other Ambulatory Visit: Payer: Self-pay | Admitting: Internal Medicine

## 2023-03-09 MED ORDER — PEN NEEDLES 31G X 5 MM MISC: 0 refills | Status: DC

## 2023-03-11 ENCOUNTER — Other Ambulatory Visit: Payer: Self-pay | Admitting: Pulmonary Disease

## 2023-03-13 ENCOUNTER — Other Ambulatory Visit: Payer: Self-pay | Admitting: Medical

## 2023-03-27 ENCOUNTER — Ambulatory Visit (INDEPENDENT_AMBULATORY_CARE_PROVIDER_SITE_OTHER): Payer: PPO

## 2023-03-27 DIAGNOSIS — Z0000 Encounter for general adult medical examination without abnormal findings: Secondary | ICD-10-CM

## 2023-03-27 NOTE — Progress Notes (Signed)
Subjective:   KRYSTAN NORTHROP is a 83 y.o. female who presents for Medicare Annual (Subsequent) preventive examination.  Visit Complete: Virtual I connected with  Luna Glasgow on 03/27/23 by a audio enabled telemedicine application and verified that I am speaking with the correct person using two identifiers.  Interactive audio and video telecommunications were attempted between this provider and patient, however failed, due to patient having technical difficulties OR patient did not have access to video capability.  We continued and completed visit with audio only.  Patient Location: Home  Provider Location: Office/Clinic  I discussed the limitations of evaluation and management by telemedicine. The patient expressed understanding and agreed to proceed.  Vital Signs: Because this visit was a virtual/telehealth visit, some criteria may be missing or patient reported. Any vitals not documented were not able to be obtained and vitals that have been documented are patient reported.    Cardiac Risk Factors include: advanced age (>57men, >16 women);diabetes mellitus;dyslipidemia;hypertension     Objective:    Today's Vitals   There is no height or weight on file to calculate BMI.     03/27/2023    2:28 PM 03/21/2022   10:20 AM 09/28/2021    9:21 PM 07/12/2021    3:42 PM 03/11/2021    4:01 PM 09/02/2019    6:41 AM 08/29/2019   11:56 AM  Advanced Directives  Does Patient Have a Medical Advance Directive? Yes Yes No No Yes No No  Type of Advance Directive Out of facility DNR (pink MOST or yellow form) Healthcare Power of [place];Living will   Out of facility DNR (pink MOST or yellow form)    Does patient want to make changes to medical advance directive?  No - Patient declined    No - Patient declined No - Patient declined  Copy of Healthcare Power of Attorney in Chart?  Yes - validated most recent copy scanned in chart (See row information)       Would patient like information  on creating a medical advance directive?   No - Patient declined   No - Patient declined     Current Medications (verified) Outpatient Encounter Medications as of 03/27/2023  Medication Sig   acetaminophen (TYLENOL) 500 MG tablet Take 2 tablets (1,000 mg total) by mouth every 8 (eight) hours as needed.   aspirin EC 81 MG tablet Take 1 tablet (81 mg total) by mouth daily.   Blood Glucose Monitoring Suppl DEVI Test 1-2 times day. Pt needs onetouch meter   Cholecalciferol (D3 5000) 125 MCG (5000 UT) capsule Take 5,000 Units by mouth daily.   CREON 36000-114000 units CPEP capsule TAKE 2 CAPSULES BY MOUTH 3 TIMES A DAY WITH MEALS AND TAKE 1 CAPSULE WITH EACH SNACK TWICE DAILY   dapagliflozin propanediol (FARXIGA) 5 MG TABS tablet Take 1 tablet (5 mg total) by mouth daily.   estradiol (ESTRACE) 0.1 MG/GM vaginal cream Pea size amount per vagina 1-2 times per week   furosemide (LASIX) 20 MG tablet Take 3 tablets (60 mg total) by mouth daily.   Glucose Blood (BLOOD GLUCOSE TEST STRIPS) STRP Test 1-2 times daily. Needs onetouch strips   insulin degludec (TRESIBA FLEXTOUCH) 100 UNIT/ML FlexTouch Pen Inject 15 Units into the skin daily after supper.   Insulin Pen Needle (PEN NEEDLES) 31G X 5 MM MISC Use for your tresiba   Lancet Device MISC Use device to test bloodsugar with lancet   Lancets Misc. MISC 1-2 times a day. Needs one touch  lancets   losartan (COZAAR) 50 MG tablet Take 1 tablet (50 mg total) by mouth daily.   macitentan (OPSUMIT) 10 MG tablet TAKE 1 TABLET BY MOUTH 1 TIME A DAY. DO NOT HANDLE IF PREGNANT. DO NOT SPLIT, CRUSH, OR CHEW. REVIEW MEDICATION GUIDE   metFORMIN (GLUCOPHAGE-XR) 750 MG 24 hr tablet TAKE 1 TABLET BY MOUTH EVERY DAY WITH BREAKFAST   metoprolol tartrate (LOPRESSOR) 25 MG tablet Take 1 tablet (25 mg total) by mouth 2 (two) times daily. Take 25 mg by mouth 2 (two) times daily.   Misc. Devices MISC SIG: 1 L of Oxygen via Nasal cannula around the clock Dispense Home Oxygen and  Portable tank and necessary supplies. Dx: Hypoxemia, Shortness of breath (Patient taking differently: Place 4-4.5 L into the nose See admin instructions. 4 - 4.5 L of Oxygen via Nasal cannula around the clock Dispense Home Oxygen and Portable tank and necessary supplies. Dx: Hypoxemia, Shortness of breath)   Multiple Vitamins-Minerals (NO IRON MULT VITAMIN-MINERALS) TABS TAKE 1 TABLET BY MOUTH DAILY (Patient taking differently: Take 1 tablet by mouth daily.)   omeprazole (PRILOSEC) 40 MG capsule TAKE 1 CAPSULE (40 MG TOTAL) BY MOUTH DAILY.   ONETOUCH VERIO test strip SMARTSIG:1 Each Via Meter Daily   potassium chloride (KLOR-CON) 10 MEQ tablet TAKE 2 TABLETS BY MOUTH DAILY   rosuvastatin (CRESTOR) 20 MG tablet TAKE 1 TABLET BY MOUTH EVERY DAY   tadalafil, PAH, (ADCIRCA) 20 MG tablet TAKE 2 TABLETS BY MOUTH 1 TIME A DAY   TRELEGY ELLIPTA 100-62.5-25 MCG/ACT AEPB INHALE 1 PUFF INTO THE LUNGS DAILY   vitamin B-12 (CYANOCOBALAMIN) 500 MCG tablet Take 500 mcg by mouth daily.   No facility-administered encounter medications on file as of 03/27/2023.    Allergies (verified) Lisinopril   History: Past Medical History:  Diagnosis Date   Arthritis    Cataract bilaterl 3 years ago   Coronary artery disease    Diabetes mellitus 02/27/2005   Diabetes mellitus type 2, controlled, without complications (HCC) 04/26/2006   Qualifier: Diagnosis of  By: Haydee Salter     Diverticulosis    Dyspnea    Echocardiogram findings abnormal, without diagnosis 2005   EF 47%, no ischemia, no infarction   Former smoker 01/10/2017   45 pack year history, quit in 2007    GERD (gastroesophageal reflux disease)    Heart murmur    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   History of bone density study 2006   Lt hip = -1.0, L spine = -1.8    Hyperlipidemia    Hypertension    Past Surgical History:  Procedure Laterality Date   ABDOMINAL HYSTERECTOMY  1989   one ovary remains per patient   CARDIAC CATHETERIZATION  08/20/2019    COLONOSCOPY     CORONARY ARTERY BYPASS GRAFT N/A 09/02/2019   Procedure: CORONARY ARTERY BYPASS GRAFTING (CABG) TIMES THREE USING LEFT INTERNAL MAMMARY ARTERY AND RIGHT GREATER SAPHENOUS VEIN;  Surgeon: Corliss Skains, MD;  Location: [facility] OR;  Service: Open Heart Surgery;  Laterality: N/A;   ENDOVEIN HARVEST OF GREATER SAPHENOUS VEIN Right 09/02/2019   Procedure: ENDOVEIN HARVEST OF GREATER SAPHENOUS VEIN;  Surgeon: Corliss Skains, MD;  Location: [facility] OR;  Service: Open Heart Surgery;  Laterality: Right;   EYE SURGERY Bilateral    CATARACT   LEFT HEART CATH AND CORONARY ANGIOGRAPHY N/A 08/20/2019   Procedure: LEFT HEART CATH AND CORONARY ANGIOGRAPHY;  Surgeon: Lyn Records, MD;  Location: [facility] INVASIVE CV LAB;  Service: Cardiovascular;  Laterality: N/A;   RIGHT AND LEFT HEART CATH N/A 07/13/2021   Procedure: RIGHT AND LEFT HEART CATH;  Surgeon: Orbie Pyo, MD;  Location: [facility] INVASIVE CV LAB;  Service: Cardiovascular;  Laterality: N/A;   TEE WITHOUT CARDIOVERSION N/A 09/02/2019   Procedure: TRANSESOPHAGEAL ECHOCARDIOGRAM (TEE);  Surgeon: Corliss Skains, MD;  Location: [facility] OR;  Service: Open Heart Surgery;  Laterality: N/A;   TONSILECTOMY, ADENOIDECTOMY, BILATERAL MYRINGOTOMY AND TUBES  1965   TONSILLECTOMY     Family History  Problem Relation Age of Onset   Heart disease Father    Cancer Sister    Tongue cancer Sister    Colon polyps Neg Hx    Rectal cancer Neg Hx    Stomach cancer Neg Hx    Social History   Socioeconomic History   Marital status: Married    Spouse name: Richard   Number of children: 1   Years of education: 12   Highest education level: High school graduate  Occupational History   Occupation: Retire-Textile    Employer: RETIRED  Tobacco Use   Smoking status: Former    Current packs/day: 0.00    Average packs/day: 1 pack/day for 45.0 years (45.0 ttl pk-yrs)    Types: Cigarettes    Start date: 07/27/1960    Quit date: 07/27/2005    Years since  quitting: 17.6   Smokeless tobacco: Former    Quit date: 03/2005  Vaping Use   Vaping status: Never Used  Substance and Sexual Activity   Alcohol use: Not Currently    Comment: beer occasionally    Drug use: No   Sexual activity: Not Currently    Partners: Male    Comment: 1st intercourse-22, partners- 5, married- 19 yrs   Other Topics Concern   Not on file  Social History Narrative   Health Care POA:    Emergency Contact: husband, Richard Production manager   End of Life Plan:    Who lives with you: Lives with husband   Any pets: gold fish   Diet: Patient has a varied diet and reports stuggeling with portion size and ice cream.   Exercise: Patient does not have currently exercise routine.   Seatbelts: Patient reports wearing seatbelt when in vehicle.   Sun Exposure/Protection: Patient reports not using sun protection.   Hobbies: Bingo         Social Drivers of Health   Financial Resource Strain: Medium Risk (03/27/2023)   Overall Financial Resource Strain (CARDIA)    Difficulty of Paying Living Expenses: Somewhat hard  Food Insecurity: No Food Insecurity (03/27/2023)   Hunger Vital Sign    Worried About Running Out of Food in the Last Year: Never true    Ran Out of Food in the Last Year: Never true  Transportation Needs: No Transportation Needs (03/27/2023)   PRAPARE - Administrator, Civil Service (Medical): No    Lack of Transportation (Non-Medical): No  Physical Activity: Inactive (03/27/2023)   Exercise Vital Sign    Days of Exercise per Week: 0 days    Minutes of Exercise per Session: 0 min  Stress: No Stress Concern Present (03/27/2023)   [employer] of Occupational Health - Occupational Stress Questionnaire    Feeling of Stress : Not at all  Social Connections: Moderately Isolated (03/27/2023)   Social Connection and Isolation Panel [NHANES]    Frequency of Communication with Friends and Family: More than three times a week    Frequency of Social Gatherings  with Friends and Family: Twice a week    Attends Religious Services: Never    Database administrator or Organizations: No    Attends Engineer, structural: Never    Marital Status: Married    Tobacco Counseling Counseling given: Not Answered   Clinical Intake:  Pre-visit preparation completed: Yes  Pain : No/denies pain     Nutritional Risks: None Diabetes: Yes CBG done?: No Did pt. bring in CBG monitor from home?: No  How often do you need to have someone help you when you read instructions, pamphlets, or other written materials from your doctor or pharmacy?: 1 - Never  Interpreter Needed?: No  Information entered by :: NAllen LPN   Activities of Daily Living    03/27/2023    2:14 PM  In your present state of health, do you have any difficulty performing the following activities:  Hearing? 0  Vision? 0  Difficulty concentrating or making decisions? 0  Walking or climbing stairs? 1  Comment gets out of breath  Dressing or bathing? 0  Doing errands, shopping? 0  Preparing Food and eating ? N  Using the Toilet? N  In the past six months, have you accidently leaked urine? Y  Comment wears a pull up at night  Do you have problems with loss of bowel control? N  Managing your Medications? N  Managing your Finances? N  Housekeeping or managing your Housekeeping? N    Patient Care Team: Tysinger, Kermit Balo, PA-C as PCP - General (Family Medicine) Parke Poisson, MD as PCP - Cardiology (Cardiology) Pa, [facility], Lesia Sago, MD as Consulting Physician (Pulmonary Disease) Napoleon Form, MD as Consulting Physician (Gastroenterology)  Indicate any recent Medical Services you may have received from other than [facility] providers in the past year (date may be approximate).     Assessment:   This is a routine wellness examination for Aurore.  Hearing/Vision screen Hearing Screening - Comments:: Denies hearing issues Vision Screening -  Comments:: Regular eye exams, [facility]   Goals Addressed             This Visit's Progress    Patient Stated       03/27/2023, wants to get a portable oxygen that she can take around       Depression Screen    03/27/2023    2:31 PM 03/21/2022   10:14 AM 03/11/2021    4:01 PM 08/02/2020   10:37 AM 02/06/2020    9:28 AM 02/02/2020    9:53 AM 12/09/2019   10:40 AM  PHQ 2/9 Scores  PHQ - 2 Score 0 0 0 0 0 0 0    Fall Risk    03/27/2023    2:29 PM 03/21/2022    9:59 AM 03/11/2021    4:01 PM 08/02/2020   10:37 AM 02/02/2020    9:53 AM  Fall Risk   Falls in the past year? 0 0 0 0 0  Number falls in past yr: 0 0  0 0  Injury with Fall? 0 0  0 0  Risk for fall due to : Medication side effect Orthopedic patient Medication side effect No Fall Risks   Follow up Falls prevention discussed;Falls evaluation completed Falls prevention discussed;Education provided Falls evaluation completed;Education provided;Falls prevention discussed Falls evaluation completed     MEDICARE RISK AT HOME: Medicare Risk at Home Any stairs in or around the home?: Yes If so, are there any without handrails?:  No Home free of loose throw rugs in walkways, pet beds, electrical cords, etc?: Yes Adequate lighting in your home to reduce risk of falls?: Yes Life alert?: No Use of a cane, walker or w/c?: No Grab bars in the bathroom?: No Shower chair or bench in shower?: Yes Elevated toilet seat or a handicapped toilet?: No  TIMED UP AND GO:  Was the test performed?  No    Cognitive Function:    10/29/2012    3:00 PM 10/26/2011    4:00 PM 07/28/2010   10:00 AM  MMSE - Mini Mental State Exam  Orientation to time 5 5 5   Orientation to Place 5 5 5   Registration 3 3 3   Attention/ Calculation 5 5 5   Recall 3 3 3   Language- name 2 objects 2 2 2   Language- repeat 1 1 1   Language- follow 3 step command 3 3 3   Language- read & follow direction 1 1 1   Write a sentence 1 1 1   Copy design 1 1 1   Total  score 30 30 30         03/27/2023    2:31 PM 03/21/2022   10:27 AM 03/11/2021    4:03 PM  6CIT Screen  What Year? 0 points 0 points 0 points  What month? 0 points 0 points 0 points  What time? 0 points 0 points 0 points  Count back from 20 0 points 0 points 0 points  Months in reverse 0 points 0 points 0 points  Repeat phrase 0 points 0 points 6 points  Total Score 0 points 0 points 6 points    Immunizations Immunization History  Administered Date(s) Administered   Fluad Quad(high Dose 65+) 11/11/2018   Fluad Trivalent(High Dose 65+) 11/27/2022   Influenza Whole 12/11/2007   Influenza, High Dose Seasonal PF 02/29/2016, 01/10/2017, 11/15/2017   Influenza,inj,Quad PF,6+ Mos 10/29/2012, 04/06/2014, 02/19/2015   Moderna Covid-19 Fall Seasonal Vaccine 11yrs & older 11/27/2022   PFIZER(Purple Top)SARS-COV-2 Vaccination 04/22/2019, 05/13/2019, 01/30/2020   Pneumococcal Conjugate-13 04/06/2014   Pneumococcal Polysaccharide-23 12/11/2007   Td 05/28/2004   Zoster Recombinant(Shingrix) 09/30/2022    TDAP status: Up to date  Flu Vaccine status: Up to date  Pneumococcal vaccine status: Up to date  Covid-19 vaccine status: Completed vaccines  Qualifies for Shingles Vaccine? Yes   Zostavax completed No   Shingrix Completed?: needs second dose  Screening Tests Health Maintenance  Topic Date Due   DTaP/Tdap/Td (2 - Tdap) 05/29/2014   LIPID PANEL  11/19/2022   Zoster Vaccines- Shingrix (2 of 2) 11/25/2022   COVID-19 Vaccine (5 - 2024-25 season) 01/22/2023   Diabetic kidney evaluation - Urine ACR  04/18/2023   HEMOGLOBIN A1C  03/28/2023   OPHTHALMOLOGY EXAM  05/04/2023   Diabetic kidney evaluation - eGFR measurement  09/25/2023   FOOT EXAM  09/25/2023   Medicare Annual Wellness (AWV)  03/26/2024   Pneumonia Vaccine 16+ Years old  Completed   INFLUENZA VACCINE  Completed   DEXA SCAN  Completed   HPV VACCINES  Aged Out   Hepatitis C Screening  Discontinued    Health  Maintenance  Health Maintenance Due  Topic Date Due   DTaP/Tdap/Td (2 - Tdap) 05/29/2014   LIPID PANEL  11/19/2022   Zoster Vaccines- Shingrix (2 of 2) 11/25/2022   COVID-19 Vaccine (5 - 2024-25 season) 01/22/2023   Diabetic kidney evaluation - Urine ACR  04/18/2023    Colorectal cancer screening: No longer required.   Mammogram status: No longer required  due to age.  Bone Density status: Completed 03/01/2016.   Lung Cancer Screening: (Low Dose CT Chest recommended if Age 32-80 years, 20 pack-year currently smoking OR have quit w/in 15years.) does not qualify.   Lung Cancer Screening Referral: no  Additional Screening:  Hepatitis C Screening: does not qualify;   Vision Screening: Recommended annual ophthalmology exams for early detection of glaucoma and other disorders of the eye. Is the patient up to date with their annual eye exam?  Yes  Who is the provider or what is the name of the office in which the patient attends annual eye exams? [facility] If pt is not established with a provider, would they like to be referred to a provider to establish care? No .   Dental Screening: Recommended annual dental exams for proper oral hygiene  Diabetic Foot Exam: Diabetic Foot Exam: Completed 09/25/2022  Community Resource Referral / Chronic Care Management: CRR required this visit?  Yes   CCM required this visit?  No     Plan:     I have personally reviewed and noted the following in the patient's chart:   Medical and social history Use of alcohol, tobacco or illicit drugs  Current medications and supplements including opioid prescriptions. Patient is not currently taking opioid prescriptions. Functional ability and status Nutritional status Physical activity Advanced directives List of other physicians Hospitalizations, surgeries, and ER visits in previous 12 months Vitals Screenings to include cognitive, depression, and falls Referrals and appointments  In  addition, I have reviewed and discussed with patient certain preventive protocols, quality metrics, and best practice recommendations. A written personalized care plan for preventive services as well as general preventive health recommendations were provided to patient.     Barb Merino, LPN   [id number]   After Visit Summary: (Pick Up) Due to this being a telephonic visit, with patients personalized plan was offered to patient and patient has requested to Pick up at office.  Nurse Notes: none

## 2023-03-27 NOTE — Patient Instructions (Signed)
Ms. Hargrove , Thank you for taking time to come for your Medicare Wellness Visit. I appreciate your ongoing commitment to your health goals. Please review the following plan we discussed and let me know if I can assist you in the future.   Referrals/Orders/Follow-Ups/Clinician Recommendations: none  This is a list of the screening recommended for you and due dates:  Health Maintenance  Topic Date Due   DTaP/Tdap/Td vaccine (2 - Tdap) 05/29/2014   Lipid (cholesterol) test  11/19/2022   Zoster (Shingles) Vaccine (2 of 2) 11/25/2022   COVID-19 Vaccine (5 - 2024-25 season) 01/22/2023   Yearly kidney health urinalysis for diabetes  04/18/2023   Hemoglobin A1C  03/28/2023   Eye exam for diabetics  05/04/2023   Yearly kidney function blood test for diabetes  09/25/2023   Complete foot exam   09/25/2023   Medicare Annual Wellness Visit  03/26/2024   Pneumonia Vaccine  Completed   Flu Shot  Completed   DEXA scan (bone density measurement)  Completed   HPV Vaccine  Aged Out   Hepatitis C Screening  Discontinued    Advanced directives: (In Chart) A copy of your advanced directives are scanned into your chart should your provider ever need it.  Next Medicare Annual Wellness Visit scheduled for next year: Yes  insert Preventive Care attachment Insert FALL PREVENTION attachment if needed

## 2023-03-28 ENCOUNTER — Telehealth: Payer: Self-pay | Admitting: *Deleted

## 2023-03-28 DIAGNOSIS — I2699 Other pulmonary embolism without acute cor pulmonale: Secondary | ICD-10-CM | POA: Diagnosis not present

## 2023-03-28 NOTE — Progress Notes (Signed)
Complex Care Management Note Care Guide Note  03/28/2023 Name: Jessica Hart MRN: [id number] DOB: 1940/05/06   Complex Care Management Outreach Attempts: An unsuccessful telephone outreach was attempted today to offer the patient information about available complex care management services.  Follow Up Plan:  Additional outreach attempts will be made to offer the patient complex care management information and services.   Encounter Outcome:  No Answer  Gwenevere Ghazi  [facility]  [facility] Guide  Direct Dial: [phone]  Fax [phone]

## 2023-03-30 ENCOUNTER — Ambulatory Visit (INDEPENDENT_AMBULATORY_CARE_PROVIDER_SITE_OTHER): Payer: PPO | Admitting: Medical

## 2023-03-30 VITALS — BP 110/70 | HR 64 | Wt 154.0 lb

## 2023-03-30 DIAGNOSIS — I2510 Atherosclerotic heart disease of native coronary artery without angina pectoris: Secondary | ICD-10-CM | POA: Diagnosis not present

## 2023-03-30 DIAGNOSIS — J9611 Chronic respiratory failure with hypoxia: Secondary | ICD-10-CM | POA: Diagnosis not present

## 2023-03-30 DIAGNOSIS — I7090 Unspecified atherosclerosis: Secondary | ICD-10-CM

## 2023-03-30 DIAGNOSIS — Z7185 Encounter for immunization safety counseling: Secondary | ICD-10-CM

## 2023-03-30 DIAGNOSIS — E1165 Type 2 diabetes mellitus with hyperglycemia: Secondary | ICD-10-CM | POA: Diagnosis not present

## 2023-03-30 DIAGNOSIS — Z951 Presence of aortocoronary bypass graft: Secondary | ICD-10-CM

## 2023-03-30 DIAGNOSIS — K8689 Other specified diseases of pancreas: Secondary | ICD-10-CM | POA: Diagnosis not present

## 2023-03-30 DIAGNOSIS — E559 Vitamin D deficiency, unspecified: Secondary | ICD-10-CM

## 2023-03-30 DIAGNOSIS — J439 Emphysema, unspecified: Secondary | ICD-10-CM

## 2023-03-30 DIAGNOSIS — E1159 Type 2 diabetes mellitus with other circulatory complications: Secondary | ICD-10-CM | POA: Diagnosis not present

## 2023-03-30 DIAGNOSIS — N904 Leukoplakia of vulva: Secondary | ICD-10-CM

## 2023-03-30 DIAGNOSIS — E1169 Type 2 diabetes mellitus with other specified complication: Secondary | ICD-10-CM | POA: Diagnosis not present

## 2023-03-30 DIAGNOSIS — I5032 Chronic diastolic (congestive) heart failure: Secondary | ICD-10-CM | POA: Diagnosis not present

## 2023-03-30 DIAGNOSIS — R809 Proteinuria, unspecified: Secondary | ICD-10-CM | POA: Diagnosis not present

## 2023-03-30 DIAGNOSIS — I2721 Secondary pulmonary arterial hypertension: Secondary | ICD-10-CM

## 2023-03-30 LAB — POCT GLYCOSYLATED HEMOGLOBIN (HGB A1C): Hemoglobin A1C: 5.7 % — AB (ref 4.0–5.6)

## 2023-03-30 NOTE — Addendum Note (Signed)
Addended by: Herminio Commons A on: 03/30/2023 04:02 PM   Modules accepted: Orders

## 2023-03-30 NOTE — Progress Notes (Signed)
Subjective:  Jessica Hart is a 83 y.o. female who presents for Chief Complaint  Patient presents with   Medical Management of Chronic Issues    Med check- having some cramping on left foot. No other concer      Medical team: Dr. Arvilla Meres, heart failure clinic Dr. Vilma Meckel, pulmonology Dr. Weston Brass, cardiology Dr. Jethro Bolus, ophthalmology Dr. Antoine Primas, ortho Dr. Marsa Aris, GI Dr. Narda Bonds, ENT Dr. Carlena Hurl, podiatry Masie Bermingham, Kermit Balo, PA-C here with primary care  Here today for med check, chronic disease follow-up  On oxygen  Diabetes -compliant with Marcelline Deist, metformin, and Tresiba injection.  She is not checking sugars rarely because her numbers usually run good.  No concerns of hypoglycemia, no weakness or shakiness lately.  Hypertension-compliant with medications  Hyperlipidemia-compliant with medications without complaint  She sees her lung doctor and heart doctor within the next month.  Uses 4 L of oxygen around-the-clock  She has been having some cramping of the left foot lately but no swelling.  No injury..  No trauma.  She has a fleshy area between her vagina and anus she wants looked at.  She wonders if it is just extra fat or skin.  No pain with this.  Lichen sclerosis that she uses Estrace cream fairly regularly of the vulvar area.  She has not seen gynecology in a while She uses Vaseline sometimes for irritation as well in the same area  No other aggravating or relieving factors.    No other c/o.  Past Medical History:  Diagnosis Date   Arthritis    Cataract bilaterl 3 years ago   Coronary artery disease    Diabetes mellitus 02/27/2005   Diabetes mellitus type 2, controlled, without complications (HCC) 04/26/2006   Qualifier: Diagnosis of  By: Haydee Salter     Diverticulosis    Dyspnea    Echocardiogram findings abnormal, without diagnosis 2005   EF 47%, no ischemia, no infarction   Former  smoker 01/10/2017   45 pack year history, quit in 2007    GERD (gastroesophageal reflux disease)    Heart murmur    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   History of bone density study 2006   Lt hip = -1.0, L spine = -1.8    Hyperlipidemia    Hypertension    Current Outpatient Medications on File Prior to Visit  Medication Sig Dispense Refill   aspirin EC 81 MG tablet Take 1 tablet (81 mg total) by mouth daily. 90 tablet 3   Cholecalciferol (D3 5000) 125 MCG (5000 UT) capsule Take 5,000 Units by mouth daily.     CREON 36000-114000 units CPEP capsule TAKE 2 CAPSULES BY MOUTH 3 TIMES A DAY WITH MEALS AND TAKE 1 CAPSULE WITH EACH SNACK TWICE DAILY 300 capsule 9   dapagliflozin propanediol (FARXIGA) 5 MG TABS tablet Take 1 tablet (5 mg total) by mouth daily. 90 tablet 1   estradiol (ESTRACE) 0.1 MG/GM vaginal cream Pea size amount per vagina 1-2 times per week 42.5 g 2   furosemide (LASIX) 20 MG tablet Take 3 tablets (60 mg total) by mouth daily. 270 tablet 2   insulin degludec (TRESIBA FLEXTOUCH) 100 UNIT/ML FlexTouch Pen Inject 15 Units into the skin daily after supper. 15 mL 2   losartan (COZAAR) 50 MG tablet Take 1 tablet (50 mg total) by mouth daily. 90 tablet 1   macitentan (OPSUMIT) 10 MG tablet TAKE 1 TABLET BY MOUTH 1 TIME A DAY. DO NOT HANDLE  IF PREGNANT. DO NOT SPLIT, CRUSH, OR CHEW. REVIEW MEDICATION GUIDE 30 tablet 10   metFORMIN (GLUCOPHAGE-XR) 750 MG 24 hr tablet TAKE 1 TABLET BY MOUTH EVERY DAY WITH BREAKFAST 90 tablet 1   metoprolol tartrate (LOPRESSOR) 25 MG tablet Take 1 tablet (25 mg total) by mouth 2 (two) times daily. Take 25 mg by mouth 2 (two) times daily. 180 tablet 1   Multiple Vitamins-Minerals (NO IRON MULT VITAMIN-MINERALS) TABS TAKE 1 TABLET BY MOUTH DAILY (Patient taking differently: Take 1 tablet by mouth daily.) 30 tablet 0   omeprazole (PRILOSEC) 40 MG capsule TAKE 1 CAPSULE (40 MG TOTAL) BY MOUTH DAILY. 90 capsule 1   potassium chloride (KLOR-CON) 10 MEQ tablet TAKE 2  TABLETS BY MOUTH DAILY 180 tablet 1   rosuvastatin (CRESTOR) 20 MG tablet TAKE 1 TABLET BY MOUTH EVERY DAY 90 tablet 1   tadalafil, PAH, (ADCIRCA) 20 MG tablet TAKE 2 TABLETS BY MOUTH 1 TIME A DAY 60 tablet 10   TRELEGY ELLIPTA 100-62.5-25 MCG/ACT AEPB INHALE 1 PUFF INTO THE LUNGS DAILY 180 each 3   vitamin B-12 (CYANOCOBALAMIN) 500 MCG tablet Take 500 mcg by mouth daily.     acetaminophen (TYLENOL) 500 MG tablet Take 2 tablets (1,000 mg total) by mouth every 8 (eight) hours as needed. 30 tablet 2   Blood Glucose Monitoring Suppl DEVI Test 1-2 times day. Pt needs onetouch meter 1 each 0   Glucose Blood (BLOOD GLUCOSE TEST STRIPS) STRP Test 1-2 times daily. Needs onetouch strips 100 strip 1   Insulin Pen Needle (PEN NEEDLES) 31G X 5 MM MISC Use for your tresiba 100 each 0   Lancet Device MISC Use device to test bloodsugar with lancet 1 each 0   Lancets Misc. MISC 1-2 times a day. Needs one touch lancets 100 each 1   Misc. Devices MISC SIG: 1 L of Oxygen via Nasal cannula around the clock Dispense Home Oxygen and Portable tank and necessary supplies. Dx: Hypoxemia, Shortness of breath (Patient taking differently: Place 4-4.5 L into the nose See admin instructions. 4 - 4.5 L of Oxygen via Nasal cannula around the clock Dispense Home Oxygen and Portable tank and necessary supplies. Dx: Hypoxemia, Shortness of breath) 1 Device 0   ONETOUCH VERIO test strip SMARTSIG:1 Each Via Meter Daily     No current facility-administered medications on file prior to visit.     The following portions of the patient's history were reviewed and updated as appropriate: allergies, current medications, past family history, past medical history, past social history, past surgical history and problem list.  ROS Otherwise as in subjective above    Objective: BP 110/70   Pulse 64   Wt 154 lb (69.9 kg)   SpO2 98%   BMI 27.28 kg/m   Wt Readings from Last 3 Encounters:  03/30/23 154 lb (69.9 kg)  01/19/23 147 lb  (66.7 kg)  11/21/22 148 lb 12.8 oz (67.5 kg)    General appearance: alert, no distress, well developed, well nourished On oxygen by nasal cannula Neck: supple, no lymphadenopathy, no thyromegaly, no masses, no JVD or bruits Heart: RRR, normal S1, S2, no murmurs Lungs: Decreased breath sounds in general, no wheezes, rhonchi, or rales Abdomen: +bs, soft, non tender, non distended, no masses, no hepatomegaly, no splenomegaly Pulses: 2+ radial pulses, 1+ pedal pulses, normal cap refill Ext: no edema Vulva with linear pinkish coloration of the labia majora down to the inferior portion of the vagina, there is also 2 small ulcerations  of the right labia majora, there appears to be a small  external hemorrhoid at the anus otherwise and is unremarkable    Assessment: Encounter Diagnoses  Name Primary?   Type 2 diabetes mellitus with hyperglycemia, unspecified whether long term insulin use (HCC) Yes   S/P CABG x 3    Pulmonary artery hypertension (HCC)    Pulmonary emphysema, unspecified emphysema type (HCC)    Pancreatic insufficiency    Microalbuminuria    Hypertension associated with diabetes (HCC)    Hyperlipidemia associated with type 2 diabetes mellitus (HCC)    Atherosclerosis    Chronic respiratory failure with hypoxia (HCC)    Chronic diastolic congestive heart failure (HCC)    Coronary artery disease involving native coronary artery of native heart without angina pectoris    Vitamin D deficiency    Vaccine counseling    Lichen sclerosus of vulva      Plan: Diabetes Continue metformin 750 milligrams XR daily Continue Farxiga 5 mg daily Continue Tresiba 15 units daily Hemoglobin A1c at goal today. Monitor blood sugars, avoid hypoglycemia   Hyperlipidemia Continue rosuvastatin Crestor 20 mg daily and aspirin 81 mg daily  Vitamin D deficiency-continue vitamin D supplement 5000 units daily  Hypertension Continue losartan 50 mg daily Continue metoprolol 25 mg twice  daily Follow up with Heart doctor soon as planned I reviewed her recent cardiology notes from 10/17/2022.  She has aortic atherosclerosis, three-vessel coronary artery disease, prior CABG including lima to LAD, cardiomegaly with right atrial and right ventricle dilation.  Echocardiogram in January 2023 showed 65 to 70% ejection fraction, normal left ventricle function, no wall motion abnormality but there is moderate LVH.  There is impaired relaxation. She was continued on Lasix 60 mg daily, tadalafil 40 mg daily and Macitentan 10mg  daily.  She is also continued on metoprolol 25 mg twice daily, rosuvastatin 10 mg daily and aspirin 81 mg daily.  Continue on losartan 50 mg daily.  Given prior lipid she was offered PCSK9 inhibitor but she declined.  Chronic respiratory failure with hypoxia, pulmonary hypertension  Continue Trelegy daily Continue tadalafil 20mg ,  2 tablets daily Continue oxygen therapy around-the-clock Follow-up with lung doctor soon as planned I reviewed office notes from 11/21/2022.  They suspect the chronic hypoxemic respiratory failure is related to emphysema likely mild early interstitial lung disease seen on CT scan, also possible due to recurrent aspiration in setting of history of GERD.  It was also seeing pulmonary hypertension on TTE.  I recommend she use oxygen to keep her oxygen saturation above 90%.  There was discussion about antifibrotic's given the interstitial lung disease but she has declined in the past.  They will see her back in about 4 months  Continue Lasix 20 mg, 3 tablets daily along with potassium Klor-Con 10 mEq twice daily  Lichen sclerosus of vulva -advise referral back to gynecology for consult   Vaccine counseling You are due for Prevnar 20 pneumonia vaccine, second shingles vaccine, updated tetanus Tdap and the RSV vaccine.  Get these at your pharmacy   Jessica Hart was seen today for medical management of chronic issues.  Diagnoses and all orders for  this visit:  Type 2 diabetes mellitus with hyperglycemia, unspecified whether long term insulin use (HCC) -     HgB A1c  S/P CABG x 3  Pulmonary artery hypertension (HCC) -     Renal Function Panel; Future -     CBC; Future  Pulmonary emphysema, unspecified emphysema type (HCC)  Pancreatic insufficiency  Microalbuminuria  Hypertension associated with diabetes (HCC) -     Renal Function Panel; Future -     Hepatic function panel; Future -     Lipid panel; Future  Hyperlipidemia associated with type 2 diabetes mellitus (HCC) -     Hepatic function panel; Future -     Lipid panel; Future  Atherosclerosis  Chronic respiratory failure with hypoxia (HCC) -     Renal Function Panel; Future -     CBC; Future  Chronic diastolic congestive heart failure (HCC)  Coronary artery disease involving native coronary artery of native heart without angina pectoris  Vitamin D deficiency -     VITAMIN D 25 Hydroxy (Vit-D Deficiency, Fractures); Future  Vaccine counseling  Lichen sclerosus of vulva    Follow up: return next week for fasting labs

## 2023-03-30 NOTE — Progress Notes (Unsigned)
Complex Care Management Note Care Guide Note  03/30/2023 Name: Jessica Hart MRN: [id number] DOB: Jul 25, 1940   Complex Care Management Outreach Attempts: A second unsuccessful outreach was attempted today to offer the patient with information about available complex care management services.  Follow Up Plan:  Additional outreach attempts will be made to offer the patient complex care management information and services.   Encounter Outcome:  No Answer  Gwenevere Ghazi  [facility]  [facility], [facility] Guide  Direct Dial: [phone]  Fax [phone]

## 2023-04-02 ENCOUNTER — Ambulatory Visit (HOSPITAL_COMMUNITY): Payer: PPO | Attending: Internal Medicine

## 2023-04-02 DIAGNOSIS — I272 Pulmonary hypertension, unspecified: Secondary | ICD-10-CM | POA: Diagnosis not present

## 2023-04-02 LAB — ECHOCARDIOGRAM COMPLETE
Area-P 1/2: 3.21 cm2
S' Lateral: 2.2 cm

## 2023-04-03 ENCOUNTER — Telehealth: Payer: Self-pay | Admitting: *Deleted

## 2023-04-03 ENCOUNTER — Encounter: Payer: Self-pay | Admitting: Pulmonary Disease

## 2023-04-03 ENCOUNTER — Ambulatory Visit: Payer: PPO | Admitting: Pulmonary Disease

## 2023-04-03 VITALS — BP 151/83 | HR 86 | Ht 63.0 in | Wt 152.8 lb

## 2023-04-03 DIAGNOSIS — J9611 Chronic respiratory failure with hypoxia: Secondary | ICD-10-CM | POA: Diagnosis not present

## 2023-04-03 DIAGNOSIS — I270 Primary pulmonary hypertension: Secondary | ICD-10-CM | POA: Diagnosis not present

## 2023-04-03 NOTE — Progress Notes (Signed)
 @Patient  ID: Jessica Hart, female    DOB: May 06, 1940, 83 y.o.   MRN: [id number]  Chief Complaint  Patient presents with   Follow-up    Referring provider: Bulah Alm RAMAN, PA-C  HPI:   83 y.o. woman whom we are seeing in follow up for volume overload and diagnosis of pulmonary HTN on RHC.  Patient presents for routine follow-up.  Continues on tadalafil  and Opsumit .  Exercising regularly.  Doing quite well.  Remains stable on 3 to 4 L at rest, she desaturates and says sometimes will get to 90 at 3 L with exertion but often it sounds like is below 90.  She is encouraged to continue her exercise routine.  Will need lab work today for drug monitoring given unable to obtain last time given no phlebotomies.  HPI at initial visit: Was in usual state of health.  Was seen PCP and routine follow-up.  Noted to be hypoxemic work-up, rooming.  Noted little bit of cough for 3 days.  She was placed on 1 L of oxygen .  Came up to 92%.  She was placed on Trelegy.  She was referred to pulmonary.  Home oxygen  was delivered.  She represented to PCP about 10 days later.  Noted to be at 87% on 1 L with exertion although no changes were made to oxygen  prescription.  She denies significant dyspnea, nothing too significant.  Able to do tasks he needs to do.  Reviewed multiple CT scans that shows mild emphysematous changes primarily in the upper lobes with interlobular septal thickening and bronchiectasis in bilateral lower lobes without clear honeycombing on my interpretation.  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:   ACT:      No data to display          MMRC:     No data to display           Epworth:      No data to display          Tests:   FENO:  No results found for: NITRICOXIDE  PFT:    Latest Ref Rng & Units 04/06/2021    3:35 PM  PFT Results  FVC-Pre L 1.74   FVC-Predicted Pre % 89   FVC-Post L 1.64   FVC-Predicted Post % 84   Pre FEV1/FVC % % 83   Post FEV1/FCV % % 84   FEV1-Pre L 1.44   FEV1-Predicted Pre % 96   FEV1-Post L 1.37   DLCO uncorrected ml/min/mmHg 6.61   DLCO UNC% % 36   DLVA Predicted % 55   TLC L 3.72   TLC % Predicted % 75   RV % Predicted % 60   Personally reviewed interpret spirometry suggestive of moderate to mild restriction versus gas trapping, no bronchodilator response, no fixed obstruction, TLC moderately reduced, DLCO severely reduced  WALK:      No data to display          Imaging: Personally reviewed and as per EMR discussion of this note ECHOCARDIOGRAM COMPLETE Result Date: 04/02/2023    ECHOCARDIOGRAM REPORT   Patient Name:   Jessica Hart Date of Exam: 04/02/2023 Medical Rec #:  [id number]           Height:  63.0 in Accession #:    [id number]          Weight:       154.0 lb Date of Birth:  1941-02-18           BSA:          1.730 m Patient Age:    83 years            BP:           110/70 mmHg Patient Gender: F                   HR:           74 bpm. Exam Location:  [address] Procedure: 2D Echo, 3D Echo, Cardiac Doppler and Color Doppler Indications:    I27.2 Pulmonary Hypertension  History:        Patient has prior history of Echocardiogram examinations, most                 recent 05/16/2022. CAD, Prior Cardiac Surgery, Abnormal ECG and                 Prior CABG, Pulmonary HTN,                 Signs/Symptoms:Dizziness/Lightheadedness, Shortness of Breath,                 Chest Pain and Murmur; Risk Factors:Family History of Coronary                 Artery Disease, Hypertension, Dyslipidemia and Former Smoker.                 Palpitaitons, Chronic Respiratory Failure.  Sonographer:    Heather Hawks  RDCS Referring Phys: GAYATRI A ACHARYA  Sonographer Comments: 3 L of O2 IMPRESSIONS  1. Left ventricular ejection fraction, by estimation, is 60 to 65%. The left ventricle has normal function. The left ventricle has no regional wall motion abnormalities. There is mild left ventricular hypertrophy. Left ventricular diastolic parameters are indeterminate. There is the interventricular septum is flattened in systole and diastole, consistent with right ventricular pressure and volume overload.  2. Right ventricular systolic function is severely reduced. The right ventricular size is moderately enlarged. There is moderately elevated pulmonary artery systolic pressure. The estimated right ventricular systolic pressure is 54.6 mmHg.  3. Left atrial size was moderately dilated.  4. Right atrial size was moderately dilated.  5. The mitral valve is degenerative. Trivial mitral valve regurgitation. No evidence of mitral stenosis. Moderate to severe mitral annular calcification.  6. The aortic valve was not well visualized. Aortic valve regurgitation is not visualized. Aortic valve sclerosis/calcification is present, without any evidence of aortic stenosis.  7. The inferior vena cava is normal in size with greater than 50% respiratory variability, suggesting right atrial pressure of 3 mmHg. Comparison(s): Prior images reviewed side by side. TR has decreased, and RVSP has decreased. RV size and function are similar. FINDINGS  Left Ventricle: Left ventricular ejection fraction, by estimation, is 60 to 65%. The left ventricle has normal function. The left ventricle has no regional wall motion abnormalities. 3D ejection fraction reviewed and evaluated as part of the interpretation. Alternate measurement of EF is felt to be most reflective of LV function. The left ventricular internal cavity size was normal in size. There is mild left ventricular hypertrophy. The interventricular septum is flattened in systole and diastole,  consistent with right ventricular pressure and volume overload. Left ventricular diastolic  parameters are indeterminate. Right Ventricle: The right ventricular size is moderately enlarged. No increase in right ventricular wall thickness. Right ventricular systolic function is severely reduced. There is moderately elevated pulmonary artery systolic pressure. The tricuspid regurgitant velocity is 3.59 m/s, and with an assumed right atrial pressure of 3 mmHg, the estimated right ventricular systolic pressure is 54.6 mmHg. Left Atrium: Left atrial size was moderately dilated. Right Atrium: Right atrial size was moderately dilated. Pericardium: There is no evidence of pericardial effusion. Mitral Valve: The mitral valve is degenerative in appearance. Moderate to severe mitral annular calcification. Trivial mitral valve regurgitation. No evidence of mitral valve stenosis. Tricuspid Valve: The tricuspid valve is normal in structure. Tricuspid valve regurgitation is mild . No evidence of tricuspid stenosis. Aortic Valve: The aortic valve was not well visualized. Aortic valve regurgitation is not visualized. Aortic valve sclerosis/calcification is present, without any evidence of aortic stenosis. Pulmonic Valve: The pulmonic valve was normal in structure. Pulmonic valve regurgitation is trivial. No evidence of pulmonic stenosis. Aorta: The aortic root is normal in size and structure. Venous: The inferior vena cava is normal in size with greater than 50% respiratory variability, suggesting right atrial pressure of 3 mmHg. IAS/Shunts: No atrial level shunt detected by color flow Doppler.  LEFT VENTRICLE PLAX 2D LVIDd:         3.90 cm   Diastology LVIDs:         2.20 cm   LV e' medial:    5.11 cm/s LV PW:         0.80 cm   LV E/e' medial:  17.2 LV IVS:        1.10 cm   LV e' lateral:   13.55 cm/s LVOT diam:     2.00 cm   LV E/e' lateral: 6.5 LV SV:         58 LV SV Index:   34 LVOT Area:     3.14 cm                            3D Volume EF:                          3D EF:        74 %                          LV EDV:       104 ml                          LV ESV:       27 ml                          LV SV:        76 ml RIGHT VENTRICLE RV Basal diam:  4.50 cm RV Mid diam:    3.20 cm RV S prime:     6.85 cm/s TAPSE (M-mode): 1.1 cm RVSP:           54.6 mmHg LEFT ATRIUM             Index        RIGHT ATRIUM           Index LA diam:        4.10 cm 2.37 cm/m   RA Pressure:  3.00 mmHg LA Vol (A2C):   94.2 ml 54.44 ml/m  RA Area:     22.50 cm LA Vol (A4C):   32.0 ml 18.49 ml/m  RA Volume:   81.80 ml  47.27 ml/m LA Biplane Vol: 58.4 ml 33.75 ml/m  AORTIC VALVE LVOT Vmax:   75.55 cm/s LVOT Vmean:  52.300 cm/s LVOT VTI:    0.186 m  AORTA Ao Root diam: 2.90 cm Ao Asc diam:  2.80 cm MITRAL VALVE                TRICUSPID VALVE MV Area (PHT): cm          TR Peak grad:   51.6 mmHg MV Decel Time: 236 msec     TR Vmax:        359.00 cm/s MV E velocity: 87.83 cm/s   Estimated RAP:  3.00 mmHg MV A velocity: 101.80 cm/s  RVSP:           54.6 mmHg MV E/A ratio:  0.86                             SHUNTS                             Systemic VTI:  0.19 m                             Systemic Diam: 2.00 cm Soyla Merck MD Electronically signed by Soyla Merck MD Signature Date/Time: 04/02/2023/11:26:04 PM    Final     Lab Results: Personally reviewed, no anemia, no polycythemia CBC    Component Value Date/Time   WBC 7.6 09/25/2022 1047   WBC 8.2 09/29/2021 0427   RBC 5.08 09/25/2022 1047   RBC 5.41 (H) 09/29/2021 0427   HGB 15.0 09/25/2022 1047   HCT 44.7 09/25/2022 1047   PLT 231 09/25/2022 1047   MCV 88 09/25/2022 1047   MCH 29.5 09/25/2022 1047   MCH 28.3 09/29/2021 0427   MCHC 33.6 09/25/2022 1047   MCHC 33.0 09/29/2021 0427   RDW 14.6 09/25/2022 1047   LYMPHSABS 1.0 09/25/2022 1047   MONOABS 0.4 09/28/2021 2133   EOSABS 0.1 09/25/2022 1047   BASOSABS 0.1 09/25/2022 1047    BMET    Component Value Date/Time   NA 137  09/25/2022 1047   K 4.2 09/25/2022 1047   CL 100 09/25/2022 1047   CO2 21 09/25/2022 1047   GLUCOSE 200 (H) 09/25/2022 1047   GLUCOSE 106 (H) 09/29/2021 0427   BUN 21 09/25/2022 1047   CREATININE 1.18 (H) 09/25/2022 1047   CREATININE 0.69 01/10/2017 1235   CALCIUM  10.0 09/25/2022 1047   GFRNONAA 51 (L) 09/29/2021 0427   GFRNONAA 86 01/10/2017 1235   GFRAA 97 02/02/2020 1108   GFRAA 99 01/10/2017 1235    BNP    Component Value Date/Time   BNP 833.9 (H) 10/10/2021 1321   BNP 1,007.8 (H) 09/28/2021 2133    ProBNP    Component Value Date/Time   PROBNP 1,156.0 (H) 03/06/2022 1552    Specialty Problems       Pulmonary Problems   SOB (shortness of breath)   Pulmonary emphysema (HCC)   Pulmonary nodules   Chronic respiratory failure with hypoxia (HCC)    Allergies  Allergen Reactions   Lisinopril Itching and Cough  Immunization History  Administered Date(s) Administered   Fluad Quad(high Dose 65+) 11/11/2018   Fluad Trivalent(High Dose 65+) 11/27/2022   Influenza Whole 12/11/2007   Influenza, High Dose Seasonal PF 02/29/2016, 01/10/2017, 11/15/2017   Influenza,inj,Quad PF,6+ Mos 10/29/2012, 04/06/2014, 02/19/2015   Moderna Covid-19 Fall Seasonal Vaccine 44yrs & older 11/27/2022   PFIZER(Purple Top)SARS-COV-2 Vaccination 04/22/2019, 05/13/2019, 01/30/2020   Pneumococcal Conjugate-13 04/06/2014   Pneumococcal Polysaccharide-23 12/11/2007   Td 05/28/2004   Zoster Recombinant(Shingrix) 09/30/2022    Past Medical History:  Diagnosis Date   Arthritis    Cataract bilaterl 3 years ago   Coronary artery disease    Diabetes mellitus 02/27/2005   Diabetes mellitus type 2, controlled, without complications (HCC) 04/26/2006   Qualifier: Diagnosis of  By: Damien Folks     Diverticulosis    Dyspnea    Echocardiogram findings abnormal, without diagnosis 2005   EF 47%, no ischemia, no infarction   Former smoker 01/10/2017   45 pack year history, quit in 2007    GERD  (gastroesophageal reflux disease)    Heart murmur    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   History of bone density study 2006   Lt hip = -1.0, L spine = -1.8    Hyperlipidemia    Hypertension     Tobacco History: Social History   Tobacco Use  Smoking Status Former   Current packs/day: 0.00   Average packs/day: 1 pack/day for 45.0 years (45.0 ttl pk-yrs)   Types: Cigarettes   Start date: 07/27/1960   Quit date: 07/27/2005   Years since quitting: 17.6  Smokeless Tobacco Former   Quit date: 03/2005   Counseling given: Not Answered   Continue to not smoke  Outpatient Encounter Medications as of 04/03/2023  Medication Sig   acetaminophen  (TYLENOL ) 500 MG tablet Take 2 tablets (1,000 mg total) by mouth every 8 (eight) hours as needed.   aspirin  EC 81 MG tablet Take 1 tablet (81 mg total) by mouth daily.   Blood Glucose Monitoring Suppl DEVI Test 1-2 times day. Pt needs onetouch meter   Cholecalciferol (D3 5000) 125 MCG (5000 UT) capsule Take 5,000 Units by mouth daily.   CREON  36000-114000 units CPEP capsule TAKE 2 CAPSULES BY MOUTH 3 TIMES A DAY WITH MEALS AND TAKE 1 CAPSULE WITH EACH SNACK TWICE DAILY   dapagliflozin  propanediol (FARXIGA ) 5 MG TABS tablet Take 1 tablet (5 mg total) by mouth daily.   estradiol  (ESTRACE ) 0.1 MG/GM vaginal cream Pea size amount per vagina 1-2 times per week   furosemide  (LASIX ) 20 MG tablet Take 3 tablets (60 mg total) by mouth daily.   Glucose Blood (BLOOD GLUCOSE TEST STRIPS) STRP Test 1-2 times daily. Needs onetouch strips   insulin  degludec (TRESIBA  FLEXTOUCH) 100 UNIT/ML FlexTouch Pen Inject 15 Units into the skin daily after supper.   Insulin  Pen Needle (PEN NEEDLES) 31G X 5 MM MISC Use for your tresiba    Lancet Device MISC Use device to test bloodsugar with lancet   Lancets Misc. MISC 1-2 times a day. Needs one touch lancets   losartan  (COZAAR ) 50 MG tablet Take 1 tablet (50 mg total) by mouth daily.   macitentan  (OPSUMIT ) 10 MG tablet TAKE 1 TABLET BY  MOUTH 1 TIME A DAY. DO NOT HANDLE IF PREGNANT. DO NOT SPLIT, CRUSH, OR CHEW. REVIEW MEDICATION GUIDE   metFORMIN  (GLUCOPHAGE -XR) 750 MG 24 hr tablet TAKE 1 TABLET BY MOUTH EVERY DAY WITH BREAKFAST   metoprolol  tartrate (LOPRESSOR ) 25 MG tablet Take 1 tablet (25  mg total) by mouth 2 (two) times daily. Take 25 mg by mouth 2 (two) times daily.   Misc. Devices MISC SIG: 1 L of Oxygen  via Nasal cannula around the clock Dispense Home Oxygen  and Portable tank and necessary supplies. Dx: Hypoxemia, Shortness of breath (Patient taking differently: Place 4-4.5 L into the nose See admin instructions. 4 - 4.5 L of Oxygen  via Nasal cannula around the clock Dispense Home Oxygen  and Portable tank and necessary supplies. Dx: Hypoxemia, Shortness of breath)   Multiple Vitamins-Minerals (NO IRON MULT VITAMIN-MINERALS) TABS TAKE 1 TABLET BY MOUTH DAILY (Patient taking differently: Take 1 tablet by mouth daily.)   omeprazole  (PRILOSEC) 40 MG capsule TAKE 1 CAPSULE (40 MG TOTAL) BY MOUTH DAILY.   ONETOUCH VERIO test strip SMARTSIG:1 Each Via Meter Daily   potassium chloride  (KLOR-CON ) 10 MEQ tablet TAKE 2 TABLETS BY MOUTH DAILY   rosuvastatin  (CRESTOR ) 20 MG tablet TAKE 1 TABLET BY MOUTH EVERY DAY   tadalafil , PAH, (ADCIRCA ) 20 MG tablet TAKE 2 TABLETS BY MOUTH 1 TIME A DAY   TRELEGY ELLIPTA  100-62.5-25 MCG/ACT AEPB INHALE 1 PUFF INTO THE LUNGS DAILY   vitamin B-12 (CYANOCOBALAMIN) 500 MCG tablet Take 500 mcg by mouth daily.   No facility-administered encounter medications on file as of 04/03/2023.     Review of Systems  Review of Systems  N/a Physical Exam  BP (!) 151/83 (BP Location: Right Arm, Patient Position: Sitting, Cuff Size: Normal)   Pulse 86   Ht 5' 3 (1.6 m)   Wt 152 lb 12.8 oz (69.3 kg)   SpO2 93%   BMI 27.07 kg/m   Wt Readings from Last 5 Encounters:  04/03/23 152 lb 12.8 oz (69.3 kg)  03/30/23 154 lb (69.9 kg)  01/19/23 147 lb (66.7 kg)  11/21/22 148 lb 12.8 oz (67.5 kg)  10/17/22 146 lb  (66.2 kg)    BMI Readings from Last 5 Encounters:  04/03/23 27.07 kg/m  03/30/23 27.28 kg/m  01/19/23 26.04 kg/m  11/21/22 26.36 kg/m  10/17/22 25.86 kg/m     Physical Exam General: Well-appearing, no acute distress Eyes: EOMI, no icterus Pulmonary: Clear, normal breathing, no crackles Cardiovascular: Regular rate and rhythm, no murmurs MSK: No synovitis, no joint effusion Neuro: Normal gait, no weakness Psych: Normal mood, full affect   Assessment & Plan:   Chronic hypoxemic respiratory failure: Suspect related to emphysema 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  Basilar fibrosis does not appear classic for UIP but no other clear pattern.  Possibly due to recurrent aspiration in setting of long history of GERD.  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  Also suspect contribution of VQ mismatch due to recently discovered likely pulmonary hypertension on TTE.  Importance of oxygen  therapy reiterated again how she should be using oxygen .  She is to use all times during the day at rest and with exertion.  Stressed keeping oxygen  saturation above 90%, ideally greater than 92%.  Continue nocturnal oxygen , 4 L.  She is repeatedly asked repetitively failed trials of POC in the office with ambulation.  POC not an option as it does not keep her oxygen  saturation above 88% likely as a result of the pulsed dosing.  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  She has poor insight into this. Consider antifibrotic's but she has declined in the past.  Can reevaluate in the future.    Pulmonary hypertension: Based on TTE with significant findings of RV enlargement, mildly reduced RV  function, mildly dilated RA all new compared to 2021.  Confirmed on right heart cath 06/2021 with normal LVEDP, preserved CO and CI.  Given rapid progression of disease group 1 disease is most likely.  Suspect contribution of group 3 disease and hypoxemia and nocturnal hypoxia via as historically she has not  used oxygen  as prescribed now with better adherence.  No OSA on PSG.  Possible group 2 disease given diastolic dysfunction although not sure how big contributor this is.  No history of VTE/PE to suggest group 4 disease.  Long and frank discussion with patient.  She is not a very good candidate for pulmonary vasodilators given the multifactorial nature of disease, significant emphysema on CT scan, demonstration of some lack of understanding of instructions in the past.  Given her significant emphysema, inhaled vasodilators have not demonstrated effectiveness in the past trials.  Systemic vasodilators pose significant risk of VQ mismatch.  We did discussed using pulmonary vasodilators both inhaled and oral.  Discussed at length the side effect profile and what to expect.  Discussed risk and benefits in terms of VQ mismatch in setting of parenchymal disease as well possible bilateral pulmonary edema depending on how well left-sided heart handles increased preload.  I think she would really struggle with 4 times daily medication such as Tyvaso.  We discussed at length that I would prefer this agent but given the 4 times daily dosing she does not think this is doable.  Nor does her husband.  Tolerating tadalafil  40 mg daily with improved subjective DOE and appetite, energy.  Opsumit  added 5/24 after worsening findings on TTE.  Continue Lasix .  BNP remains historically elevated.  Recheck today.  LFTs for intensive drug monitoring while on ERA.  Return in about 3 months (around 07/01/2023) for f/u Dr. Annella.   Donnice JONELLE Annella, MD 04/03/2023

## 2023-04-03 NOTE — Patient Instructions (Signed)
It is great to see you again  We need to get labs today to keep an eye on the liver especially on the medicine called Opsumit  No changes to medication  Return to clinic in 3 months or sooner as needed with Dr. Judeth Horn

## 2023-04-03 NOTE — Progress Notes (Signed)
 Complex Care Management Note Care Guide Note  04/03/2023 Name: Jessica Hart MRN: [id number] DOB: May 28, 1940   Complex Care Management Outreach Attempts: A third unsuccessful outreach was attempted today to offer the patient with information about available complex care management services.  Follow Up Plan:  No further outreach attempts will be made at this time. We have been unable to contact the patient to offer or enroll patient in complex care management services.  Encounter Outcome:  No Answer  Harlene Satterfield  [facility]  [facility], [facility] Guide  Direct Dial: [phone]  Fax [phone]

## 2023-04-03 NOTE — Progress Notes (Signed)
 Complex Care Management Note  Care Guide Note 04/03/2023 Name: SHARVI MOONEYHAN MRN: [id number] DOB: May 27, 1940  Othel LELON Glade is a 83 y.o. year old female who sees Tysinger, Alm RAMAN, PA-C for primary care. I reached out to Othel LELON Gunner by phone today to offer complex care management services.  Ms. Kreiser was given information about Complex Care Management services today including:   The Complex Care Management services include support from the care team which includes your Nurse Care Manager, Clinical Social Worker, or Pharmacist.  The Complex Care Management team is here to help remove barriers to the health concerns and goals most important to you. Complex Care Management services are voluntary, and the patient may decline or stop services at any time by request to their care team member.   Complex Care Management Consent Status: Patient agreed to services and verbal consent obtained.   Follow up plan:  Telephone appointment with complex care management team member scheduled for:  2/24  Encounter Outcome:  Patient Scheduled  Harlene Satterfield  [facility]  [facility], [facility] Guide  Direct Dial: [phone]  Fax [phone]

## 2023-04-03 NOTE — Addendum Note (Signed)
Addended byVilma Meckel on: 04/03/2023 04:14 PM   Modules accepted: Orders

## 2023-04-04 LAB — HEPATIC FUNCTION PANEL
ALT: 11 U/L (ref 0–35)
AST: 24 U/L (ref 0–37)
Albumin: 4.5 g/dL (ref 3.5–5.2)
Alkaline Phosphatase: 113 U/L (ref 39–117)
Bilirubin, Direct: 0.2 mg/dL (ref 0.0–0.3)
Total Bilirubin: 0.7 mg/dL (ref 0.2–1.2)
Total Protein: 8 g/dL (ref 6.0–8.3)

## 2023-04-04 LAB — BRAIN NATRIURETIC PEPTIDE: Pro B Natriuretic peptide (BNP): 259 pg/mL — ABNORMAL HIGH (ref 0.0–100.0)

## 2023-04-04 NOTE — Progress Notes (Signed)
 LFTs stable.  BNP reduced.  Continue current regimen, Opsumit  and tadalafil .

## 2023-04-06 ENCOUNTER — Ambulatory Visit: Payer: PPO | Attending: Internal Medicine | Admitting: Internal Medicine

## 2023-04-06 ENCOUNTER — Encounter: Payer: Self-pay | Admitting: Internal Medicine

## 2023-04-06 VITALS — BP 142/76 | HR 86 | Ht 63.0 in | Wt 153.6 lb

## 2023-04-06 DIAGNOSIS — Z951 Presence of aortocoronary bypass graft: Secondary | ICD-10-CM | POA: Diagnosis not present

## 2023-04-06 DIAGNOSIS — R252 Cramp and spasm: Secondary | ICD-10-CM | POA: Diagnosis not present

## 2023-04-06 DIAGNOSIS — Z87891 Personal history of nicotine dependence: Secondary | ICD-10-CM

## 2023-04-06 DIAGNOSIS — R0602 Shortness of breath: Secondary | ICD-10-CM

## 2023-04-06 DIAGNOSIS — J9611 Chronic respiratory failure with hypoxia: Secondary | ICD-10-CM

## 2023-04-06 DIAGNOSIS — R0600 Dyspnea, unspecified: Secondary | ICD-10-CM

## 2023-04-06 DIAGNOSIS — J439 Emphysema, unspecified: Secondary | ICD-10-CM | POA: Diagnosis not present

## 2023-04-06 DIAGNOSIS — E1159 Type 2 diabetes mellitus with other circulatory complications: Secondary | ICD-10-CM | POA: Diagnosis not present

## 2023-04-06 DIAGNOSIS — I152 Hypertension secondary to endocrine disorders: Secondary | ICD-10-CM | POA: Diagnosis not present

## 2023-04-06 DIAGNOSIS — I272 Pulmonary hypertension, unspecified: Secondary | ICD-10-CM | POA: Diagnosis not present

## 2023-04-06 DIAGNOSIS — I5033 Acute on chronic diastolic (congestive) heart failure: Secondary | ICD-10-CM

## 2023-04-06 DIAGNOSIS — I2510 Atherosclerotic heart disease of native coronary artery without angina pectoris: Secondary | ICD-10-CM

## 2023-04-06 DIAGNOSIS — I5032 Chronic diastolic (congestive) heart failure: Secondary | ICD-10-CM | POA: Diagnosis not present

## 2023-04-06 NOTE — Progress Notes (Signed)
 Cardiology Office Note:  .   Date:  04/06/2023  ID:  Jessica Hart, DOB Mar 29, 1940, MRN [id number] PCP: Jessica Alm GORMAN DEVONNA  [facility] Cardiologist:  Jessica DELENA Merck, MD    History of Present Illness: [name] is a 83 y.o. female.  Discussed the use of AI scribe software for clinical note transcription with the patient, who gave verbal consent to proceed.  History of Present Illness   The patient, with a history of lung disease, pulmonary hypertension, and CAD s/p CABG, presents for a routine follow-up. She reports shortness of breath, particularly after physical exertion such as walking. The patient is currently on oxygen  therapy, with a home concentrator set at four, but she can manage on two or three when out, such as at the grocery store. She expresses a desire for a more portable oxygen  delivery system, such as a backpack, to increase her mobility and independence. This has been reviewed and discussed by her pulmonologist.  The patient also reports a recent weight gain, which she views positively as she had previously lost a significant amount of weight. She has been more active recently, including using a bike for 15-20 minutes at a time. She has experienced muscle spasms in her leg, which she manages with hydration and Pedialyte.  The patient's social history is notable for the loss of a close friend, which she feels has impacted her health. She was previously very active in caring for this friend, and since her friend's passing, she has been less active and more homebound.        ROS: negative except per HPI above.  Studies Reviewed: SABRA   EKG Interpretation Date/Time:  Friday April 06 2023 14:24:21 EST Ventricular Rate:  74 PR Interval:  176 QRS Duration:  86 QT Interval:  422 QTC Calculation: 468 R Axis:   60  Text Interpretation: Sinus rhythm with occasional Premature ventricular complexes Possible Left atrial enlargement  Cannot rule out Inferior infarct (cited on or before 17-Oct-2022) Anterior infarct , age undetermined When compared with ECG of 17-Oct-2022 15:21, No significant change was found Confirmed by Hart Jessica ([id number]) on 04/06/2023 2:38:15 PM    Results   LABS HbA1c: 5.7%  DIAGNOSTIC Echocardiography 04/02/23:  1. Left ventricular ejection fraction, by estimation, is 60 to 65%. The  left ventricle has normal function. The left ventricle has no regional  wall motion abnormalities. There is mild left ventricular hypertrophy.  Left ventricular diastolic parameters  are indeterminate. There is the interventricular septum is flattened in  systole and diastole, consistent with right ventricular pressure and  volume overload.   2. Right ventricular systolic function is severely reduced. The right  ventricular size is moderately enlarged. There is moderately elevated  pulmonary artery systolic pressure. The estimated right ventricular  systolic pressure is 54.6 mmHg.   3. Left atrial size was moderately dilated.   4. Right atrial size was moderately dilated.   5. The mitral valve is degenerative. Trivial mitral valve regurgitation.  No evidence of mitral stenosis. Moderate to severe mitral annular  calcification.   6. The aortic valve was not well visualized. Aortic valve regurgitation  is not visualized. Aortic valve sclerosis/calcification is present,  without any evidence of aortic stenosis.   7. The inferior vena cava is normal in size with greater than 50%  respiratory variability, suggesting right atrial pressure of 3 mmHg.   Comparison(s): Prior images reviewed side by side. TR has decreased, and  RVSP has decreased. RV size and function are similar.   EKG: Normal     Risk Assessment/Calculations:        Physical Exam:   VS:  BP (!) 142/76 (BP Location: Right Arm, Patient Position: Sitting, Cuff Size: Normal)   Pulse 86   Ht 5' 3 (1.6 m)   Wt 153 lb 9.6 oz (69.7 kg)   SpO2 96%  Comment: on O2 tank  BMI 27.21 kg/m    Wt Readings from Last 3 Encounters:  04/06/23 153 lb 9.6 oz (69.7 kg)  04/03/23 152 lb 12.8 oz (69.3 kg)  03/30/23 154 lb (69.9 kg)     Physical Exam   VITALS: SaO2- 96% MEASUREMENTS: WT- 154 lbs CHEST: Breath sounds clear. CARDIOVASCULAR: Heart sounds normal.     GEN: Well nourished, well developed in no acute distress NECK: No JVD; No carotid bruits CARDIAC: RRR, no murmurs, rubs, gallops RESPIRATORY:  Clear to auscultation without rales, wheezing or rhonchi  ABDOMEN: Soft, non-tender, non-distended EXTREMITIES:  No edema; No deformity   ASSESSMENT AND PLAN: .    Assessment & Plan Pulmonary HTN (HCC)  Chronic diastolic congestive heart failure (HCC)  Coronary artery disease involving native heart without angina pectoris, unspecified vessel or lesion type  Pulmonary emphysema, unspecified emphysema type (HCC)  Dyspnea, unspecified type  S/P CABG x 3  Former smoker  Chronic respiratory failure with hypoxia (HCC)  SOB (shortness of breath)  Muscle cramp   Assessment and Plan    Chronic Lung Disease - overall, she has symptomatically improved and is stable. Initally with big setback at initial dx of PHTN but she is improving.  Shortness of breath with exertion, but improved with oxygen  therapy. Patient is able to perform activities of daily living with supplemental oxygen  at 2-3L. No changes in medications. -Continue current oxygen  therapy regimen. -Encourage physical activity as tolerated.  Pulmonary Hypertension Stable on current medications (Tadalafil  40 mg daily and Opsumit  10 mg daily). Echocardiogram shows right heart dysfunction, but no significant change from previous. -Continue current medications. -Repeat echocardiogram in 1 year unless symptoms worsen.  Muscle Cramps Patient reports muscle spasms in the leg. Discussed hydration, massage, dietary interventions, and potential use of magnesium   supplements. -Encourage hydration and use of Pedialyte. -Consider OTC magnesium  supplement if cramps persist.   CAD s/p CABG - no chest pain - continue medical therapy: asa 81 mg daily, farxiga , furosemide  20 mg daily, losartan  50 mg dialy, metoprolol  tartrate 25 mg BID, kcl (with lasix ) 20 meq daily, crestor  20 mg daily.   General Health Maintenance Weight gain noted, but patient feels well. A1C is 5.7, indicating good blood glucose control. -Continue current lifestyle and dietary habits. -Follow-up in 6 months.

## 2023-04-06 NOTE — Patient Instructions (Signed)
 Medication Instructions:  Your physician recommends that you continue on your current medications as directed. Please refer to the Current Medication list given to you today.  *If you need a refill on your cardiac medications before your next appointment, please call your pharmacy*  Lab Work: None  Follow-Up: At [facility], you and your health needs are our priority.  As part of our continuing mission to provide you with exceptional heart care, we have created designated Provider Care Teams.  These Care Teams include your primary Cardiologist (physician) and Advanced Practice Providers (APPs -  Physician Assistants and Nurse Practitioners) who all work together to provide you with the care you need, when you need it.  We recommend signing up for the patient portal called "MyChart".  Sign up information is provided on this After Visit Summary.  MyChart is used to connect with patients for Virtual Visits (Telemedicine).  Patients are able to view lab/test results, encounter notes, upcoming appointments, etc.  Non-urgent messages can be sent to your provider as well.   To learn more about what you can do with MyChart, go to [email or web address].    Your next appointment:   6 month(s)  Provider:   Parke Poisson, MD  {

## 2023-04-10 ENCOUNTER — Telehealth: Payer: Self-pay | Admitting: Internal Medicine

## 2023-04-10 ENCOUNTER — Telehealth: Payer: Self-pay | Admitting: Pulmonary Disease

## 2023-04-10 DIAGNOSIS — H35033 Hypertensive retinopathy, bilateral: Secondary | ICD-10-CM | POA: Diagnosis not present

## 2023-04-10 DIAGNOSIS — E113393 Type 2 diabetes mellitus with moderate nonproliferative diabetic retinopathy without macular edema, bilateral: Secondary | ICD-10-CM | POA: Diagnosis not present

## 2023-04-10 DIAGNOSIS — H35372 Puckering of macula, left eye: Secondary | ICD-10-CM | POA: Diagnosis not present

## 2023-04-10 DIAGNOSIS — H524 Presbyopia: Secondary | ICD-10-CM | POA: Diagnosis not present

## 2023-04-10 DIAGNOSIS — H34232 Retinal artery branch occlusion, left eye: Secondary | ICD-10-CM | POA: Diagnosis not present

## 2023-04-10 DIAGNOSIS — H35013 Changes in retinal vascular appearance, bilateral: Secondary | ICD-10-CM | POA: Diagnosis not present

## 2023-04-10 NOTE — Telephone Encounter (Signed)
Error

## 2023-04-10 NOTE — Telephone Encounter (Signed)
Marylene Land with [facility] is calling about notes that were sent August 9th 2024. They are scanned under the Media tab 10/09/22. She states pt was requesting to have a stroke work up and would like to have a Carotid US or MRA/CTA of head and neck ordered. Please advise.

## 2023-04-11 ENCOUNTER — Other Ambulatory Visit: Payer: Self-pay | Admitting: Medical

## 2023-04-11 DIAGNOSIS — H35039 Hypertensive retinopathy, unspecified eye: Secondary | ICD-10-CM

## 2023-04-11 DIAGNOSIS — H34239 Retinal artery branch occlusion, unspecified eye: Secondary | ICD-10-CM

## 2023-04-11 DIAGNOSIS — I7090 Unspecified atherosclerosis: Secondary | ICD-10-CM

## 2023-04-11 NOTE — Telephone Encounter (Signed)
Left message for pt to call me back

## 2023-04-12 ENCOUNTER — Other Ambulatory Visit: Payer: Self-pay | Admitting: Internal Medicine

## 2023-04-12 ENCOUNTER — Other Ambulatory Visit: Payer: PPO

## 2023-04-12 DIAGNOSIS — E559 Vitamin D deficiency, unspecified: Secondary | ICD-10-CM | POA: Diagnosis not present

## 2023-04-12 DIAGNOSIS — E785 Hyperlipidemia, unspecified: Secondary | ICD-10-CM | POA: Diagnosis not present

## 2023-04-12 DIAGNOSIS — J9611 Chronic respiratory failure with hypoxia: Secondary | ICD-10-CM

## 2023-04-12 DIAGNOSIS — E1169 Type 2 diabetes mellitus with other specified complication: Secondary | ICD-10-CM

## 2023-04-12 DIAGNOSIS — I2721 Secondary pulmonary arterial hypertension: Secondary | ICD-10-CM | POA: Diagnosis not present

## 2023-04-12 DIAGNOSIS — I152 Hypertension secondary to endocrine disorders: Secondary | ICD-10-CM | POA: Diagnosis not present

## 2023-04-12 DIAGNOSIS — E1159 Type 2 diabetes mellitus with other circulatory complications: Secondary | ICD-10-CM | POA: Diagnosis not present

## 2023-04-12 LAB — LIPID PANEL

## 2023-04-12 NOTE — Telephone Encounter (Signed)
Pt was notified.

## 2023-04-13 LAB — HEPATIC FUNCTION PANEL
ALT: 12 [IU]/L (ref 0–32)
AST: 26 [IU]/L (ref 0–40)
Alkaline Phosphatase: 130 [IU]/L — ABNORMAL HIGH (ref 44–121)
Bilirubin Total: 0.6 mg/dL (ref 0.0–1.2)
Bilirubin, Direct: 0.21 mg/dL (ref 0.00–0.40)
Total Protein: 6.8 g/dL (ref 6.0–8.5)

## 2023-04-13 LAB — CBC
Hematocrit: 39.7 % (ref 34.0–46.6)
Hemoglobin: 12.9 g/dL (ref 11.1–15.9)
MCH: 29.5 pg (ref 26.6–33.0)
MCHC: 32.5 g/dL (ref 31.5–35.7)
MCV: 91 fL (ref 79–97)
Platelets: 226 10*3/uL (ref 150–450)
RBC: 4.37 x10E6/uL (ref 3.77–5.28)
RDW: 13.1 % (ref 11.7–15.4)
WBC: 7.7 10*3/uL (ref 3.4–10.8)

## 2023-04-13 LAB — RENAL FUNCTION PANEL
Albumin: 4.1 g/dL (ref 3.7–4.7)
BUN/Creatinine Ratio: 17 (ref 12–28)
BUN: 16 mg/dL (ref 8–27)
CO2: 23 mmol/L (ref 20–29)
Calcium: 8.9 mg/dL (ref 8.7–10.3)
Chloride: 103 mmol/L (ref 96–106)
Creatinine, Ser: 0.94 mg/dL (ref 0.57–1.00)
Glucose: 69 mg/dL — ABNORMAL LOW (ref 70–99)
Phosphorus: 3.7 mg/dL (ref 3.0–4.3)
Potassium: 3.7 mmol/L (ref 3.5–5.2)
Sodium: 142 mmol/L (ref 134–144)
eGFR: 61 mL/min/{1.73_m2} (ref >59)

## 2023-04-13 LAB — LIPID PANEL
Cholesterol, Total: 110 mg/dL (ref 100–199)
HDL: 42 mg/dL (ref >39)
LDL CALC COMMENT:: 2.6 ratio (ref 0.0–4.4)
LDL Chol Calc (NIH): 50 mg/dL (ref 0–99)
Triglycerides: 96 mg/dL (ref 0–149)
VLDL Cholesterol Cal: 18 mg/dL (ref 5–40)

## 2023-04-13 LAB — VITAMIN D 25 HYDROXY (VIT D DEFICIENCY, FRACTURES): Vit D, 25-Hydroxy: 49.6 ng/mL (ref 30.0–100.0)

## 2023-04-13 NOTE — Progress Notes (Signed)
Liver test okay, electrolytes okay, vitamin D okay, blood counts okay, cholesterol looks good.  Blood sugar on the low side.  If she get any readings at home less than 70 regularly?  If so we need to cut something back as I dont want her having low readings

## 2023-04-23 ENCOUNTER — Telehealth: Payer: Self-pay

## 2023-04-23 ENCOUNTER — Ambulatory Visit: Payer: Self-pay | Admitting: Licensed Clinical Social Worker

## 2023-04-23 MED ORDER — TRESIBA FLEXTOUCH 100 UNIT/ML ~~LOC~~ SOPN: 15.0000 [IU] | PEN_INJECTOR | Freq: Every day | SUBCUTANEOUS | 0 refills | Status: DC

## 2023-04-23 NOTE — Progress Notes (Signed)
   04/23/2023  Patient ID: Jessica Hart, female   DOB: [birth year], 83 y.o.   MRN: [id number]  Received notification that patient's Tresiba shipment through [employer] had been delayed. Was able to secure a 30 day voucher from company until shipment can arrive.  BIN: [id number] PCN: [id number] GROUP: [id number] ID: [id number]  Attempted to contact patient to notify her of voucher and to pick up at her preferred pharmacy. Had to leave a voicemail.

## 2023-04-23 NOTE — Patient Instructions (Signed)
 Visit Information  Thank you for taking time to visit with me today. Please don't hesitate to contact me if I can be of assistance to you.   Following are the goals we discussed today:   Goals Addressed             This Visit's Progress    Care Coordination Activities       Care Coordination Interventions: Patient stated that her and her husband have been renting a house for over 20 years and her utility bills are higher than normal, the gas bill being $366.09 and the light bill being $101.20. The SW encouraged her to call and make arrangements on her bill and also offered to mail resources for assistance. The bills are not in disconnection status at this time. SW will mail the resources. Patinet has to have her utilities due to her being on oxygen 24/7 The patient and her husband pay $650 in rent fo ra home that they have been living in for 20 years.  The household does receive $45 om food stamps and they have food and no issues at this time.  SW completed the SDOH and there are no other issues.  SW will follow up on 05/09/2023 at 11:00 am         Our next appointment is by telephone on 05/09/2023 at 11:00 am  Please call the care guide team at [phone] if you need to cancel or reschedule your appointment.   If you are experiencing a Mental Health or Behavioral Health Crisis or need someone to talk to, please call the Suicide and Crisis Lifeline: 988 go to [facility] [address], [place] [phone]) call 911  The patient verbalized understanding of instructions, educational materials, and care plan provided today and DECLINED offer to receive copy of patient instructions, educational materials, and care plan.   Jeanie Cooks, PhD [facility], [facility] Social Worker Direct Dial: [phone]  Fax: [phone]

## 2023-04-23 NOTE — Patient Outreach (Signed)
 Care Coordination   Initial Visit Note   04/23/2023 Name: Jessica Hart MRN: [id number] DOB: 04-Sep-1940  Jessica Hart is a 83 y.o. year old female who sees Tysinger, Kermit Balo, PA-C for primary care. I spoke with  Jessica Hart by phone today.  What matters to the patients health and wellness today?  Utility assistance    Goals Addressed             This Visit's Progress    Care Coordination Activities       Care Coordination Interventions: Patient stated that her and her husband have been renting a house for over 20 years and her utility bills are higher than normal, the gas bill being $366.09 and the light bill being $101.20. The SW encouraged her to call and make arrangements on her bill and also offered to mail resources for assistance. The bills are not in disconnection status at this time. SW will mail the resources. Patinet has to have her utilities due to her being on oxygen 24/7 The patient and her husband pay $650 in rent fo ra home that they have been living in for 20 years.  The household does receive $45 om food stamps and they have food and no issues at this time.  SW completed the SDOH and there are no other issues.  SW will follow up on 05/09/2023 at 11:00 am         SDOH assessments and interventions completed:  Yes  SDOH Interventions Today    Flowsheet Row Most Recent Value  SDOH Interventions   Food Insecurity Interventions Intervention Not Indicated  Housing Interventions Intervention Not Indicated  Transportation Interventions Intervention Not Indicated  Utilities Interventions Community Resources Provided  [Sw will mail the resources for utilties]        Care Coordination Interventions:  Yes, provided  Interventions Today    Flowsheet Row Most Recent Value  General Interventions   General Interventions Discussed/Reviewed General Interventions Discussed, Community Resources  [SW will mail out resources for Utility assistance]         Follow up plan: Follow up call scheduled for 05/09/2023 at 11:00 am    Encounter Outcome:  Patient Visit Completed  Jeanie Cooks, PhD [facility], [facility] Social Worker Direct Dial: [phone]  Fax: [phone]

## 2023-04-27 ENCOUNTER — Ambulatory Visit: Payer: Self-pay | Admitting: Licensed Clinical Social Worker

## 2023-04-27 NOTE — Patient Outreach (Signed)
 Care Coordination   Follow Up Visit Note   04/27/2023 Name: Jessica Hart MRN: [id number] DOB: 01/30/1941  Jessica Hart is a 83 y.o. year old female who sees Tysinger, Kermit Balo, PA-C for primary care. I spoke with  Luna Glasgow by phone today.  What matters to the patients health and wellness today?  Utility assistance    Goals Addressed   None     SDOH assessments and interventions completed:  Yes     Care Coordination Interventions:  Yes, provided  Interventions Today    Flowsheet Row Most Recent Value  General Interventions   General Interventions Discussed/Reviewed General Interventions Reviewed  [Patient received the disconnection notice and will call to set up a payment arrangment]        Follow up plan: Follow up call scheduled for 05/09/2023 at 11:00 am    Encounter Outcome:  Patient Visit Completed   Jeanie Cooks, PhD [facility], [facility] Social Worker Direct Dial: [phone]  Fax: [phone]

## 2023-04-27 NOTE — Patient Instructions (Signed)
 Visit Information  Thank you for taking time to visit with me today. Please don't hesitate to contact me if I can be of assistance to you.   Following are the goals we discussed today:   Goals Addressed   None     Our next appointment is by telephone on 05/09/2023 at 11:00 am  Please call the care guide team at [phone] if you need to cancel or reschedule your appointment.   If you are experiencing a Mental Health or Behavioral Health Crisis or need someone to talk to, please call the Suicide and Crisis Lifeline: 988 go to [facility] [address], [place] [phone]) call 911  The patient verbalized understanding of instructions, educational materials, and care plan provided today and DECLINED offer to receive copy of patient instructions, educational materials, and care plan.   Jeanie Cooks, PhD [facility], [facility] Social Worker Direct Dial: [phone]  Fax: [phone]

## 2023-05-01 ENCOUNTER — Telehealth: Payer: Self-pay | Admitting: Pulmonary Disease

## 2023-05-02 NOTE — Telephone Encounter (Signed)
 Faxed back 05/02/23 confirmation received

## 2023-05-04 ENCOUNTER — Telehealth: Payer: Self-pay | Admitting: Podiatry

## 2023-05-04 NOTE — Telephone Encounter (Signed)
Left a voicemail for patient to call and reschedule appointment.

## 2023-05-07 DIAGNOSIS — I270 Primary pulmonary hypertension: Secondary | ICD-10-CM | POA: Diagnosis not present

## 2023-05-07 DIAGNOSIS — J9611 Chronic respiratory failure with hypoxia: Secondary | ICD-10-CM | POA: Diagnosis not present

## 2023-05-09 ENCOUNTER — Ambulatory Visit: Payer: Self-pay | Admitting: Licensed Clinical Social Worker

## 2023-05-09 NOTE — Patient Outreach (Signed)
 Care Coordination   Follow Up Visit Note   05/09/2023 Name: Jessica Hart MRN: [id number] DOB: [birth year]  Jessica Hart is a 83 y.o. year old female who sees Tysinger, Kermit Balo, PA-C for primary care. I spoke with  Luna Glasgow by phone today.  What matters to the patients health and wellness today?  Utility assistance     Goals Addressed             This Visit's Progress    COMPLETED: Care Coordination Activities       Care Coordination Interventions: Patient stated that her and her husband have been renting a house for over 20 years and her utility bills are higher than normal, the gas bill being $366.09 and the light bill being $101.20. The SW encouraged her to call and make arrangements on her bill and also offered to mail resources for assistance. The bills are not in disconnection status at this time. SW will mail the resources. Patinet has to have her utilities due to her being on oxygen 24/7 The patient and her husband pay $650 in rent fo ra home that they have been living in for 20 years.  The household does receive $45 om food stamps and they have food and no issues at this time.  SW completed the SDOH and there are no other issues.  SW will follow up on 05/09/2023 at 11:00 am         SDOH assessments and interventions completed:  Yes  SDOH Interventions Today    Flowsheet Row Most Recent Value  SDOH Interventions   Food Insecurity Interventions Intervention Not Indicated  Housing Interventions Intervention Not Indicated  Transportation Interventions Intervention Not Indicated  Utilities Interventions Intervention Not Indicated        Care Coordination Interventions:  Yes, provided  Interventions Today    Flowsheet Row Most Recent Value  General Interventions   General Interventions Discussed/Reviewed General Interventions Reviewed, Science writer did contact DSS but over income for assistance, Patient did a payment  arrangement ofr utility bills.]        Follow up plan: No further intervention required.   Encounter Outcome:  Patient Visit Completed  Jeanie Cooks, PhD [facility], [facility] Social Worker Direct Dial: [phone]  Fax: [phone]

## 2023-05-09 NOTE — Patient Instructions (Signed)
 Visit Information  Thank you for taking time to visit with me today. Please don't hesitate to contact me if I can be of assistance to you.   Following are the goals we discussed today:   Goals Addressed             This Visit's Progress    COMPLETED: Care Coordination Activities       Care Coordination Interventions: Patient stated that her and her husband have been renting a house for over 20 years and her utility bills are higher than normal, the gas bill being $366.09 and the light bill being $101.20. The SW encouraged her to call and make arrangements on her bill and also offered to mail resources for assistance. The bills are not in disconnection status at this time. SW will mail the resources. Patinet has to have her utilities due to her being on oxygen 24/7 The patient and her husband pay $650 in rent fo ra home that they have been living in for 20 years.  The household does receive $45 om food stamps and they have food and no issues at this time.  SW completed the SDOH and there are no other issues.  SW will follow up on 05/09/2023 at 11:00 am         No further follow up  needed, SW has encouraged the patient to contact their PCP for any SDOH needs  Please call the care guide team at [phone] if you need to cancel or reschedule your appointment.   If you are experiencing a Mental Health or Behavioral Health Crisis or need someone to talk to, please call the Suicide and Crisis Lifeline: 988 go to [facility] Urgent Care [address] [place] [phone]) call 911  The patient verbalized understanding of instructions, educational materials, and care plan provided today and DECLINED offer to receive copy of patient instructions, educational materials, and care plan.   Jeanie Cooks, PhD [facility], [facility] Social Worker Direct Dial: [phone]  Fax: [phone]

## 2023-05-15 ENCOUNTER — Ambulatory Visit
Admission: RE | Admit: 2023-05-15 | Discharge: 2023-05-15 | Disposition: A | Discharge status: Home / Self Care | Payer: PPO | Source: Ambulatory Visit | Attending: Medical | Admitting: Medical

## 2023-05-15 DIAGNOSIS — I7090 Unspecified atherosclerosis: Secondary | ICD-10-CM

## 2023-05-15 DIAGNOSIS — I672 Cerebral atherosclerosis: Secondary | ICD-10-CM | POA: Diagnosis not present

## 2023-05-15 DIAGNOSIS — H35039 Hypertensive retinopathy, unspecified eye: Secondary | ICD-10-CM

## 2023-05-15 DIAGNOSIS — H34239 Retinal artery branch occlusion, unspecified eye: Secondary | ICD-10-CM

## 2023-05-15 DIAGNOSIS — I6521 Occlusion and stenosis of right carotid artery: Secondary | ICD-10-CM | POA: Diagnosis not present

## 2023-05-15 NOTE — Telephone Encounter (Signed)
 PAP TRESIBA received, left message for pt

## 2023-05-24 ENCOUNTER — Ambulatory Visit: Payer: PPO | Admitting: Podiatry

## 2023-05-31 ENCOUNTER — Encounter: Payer: Self-pay | Admitting: Podiatry

## 2023-05-31 ENCOUNTER — Ambulatory Visit: Admitting: Podiatry

## 2023-05-31 DIAGNOSIS — M79675 Pain in left toe(s): Secondary | ICD-10-CM | POA: Diagnosis not present

## 2023-05-31 DIAGNOSIS — M79674 Pain in right toe(s): Secondary | ICD-10-CM

## 2023-05-31 DIAGNOSIS — L84 Corns and callosities: Secondary | ICD-10-CM

## 2023-05-31 DIAGNOSIS — B351 Tinea unguium: Secondary | ICD-10-CM | POA: Diagnosis not present

## 2023-05-31 DIAGNOSIS — E1142 Type 2 diabetes mellitus with diabetic polyneuropathy: Secondary | ICD-10-CM

## 2023-05-31 NOTE — Progress Notes (Signed)
  Subjective:  Patient ID: Jessica Hart, female    DOB: 1940-11-11,  MRN: [id number]  Chief Complaint  Patient presents with   [facility]    She is here for a nail trim and my last A1C was 6 something"    83 y.o. female presents with the above complaint. History confirmed with patient. Patient presenting with pain related to dystrophic thickened elongated nails. Patient is unable to trim own nails related to nail dystrophy and/or mobility issues. Patient does have a history of T2DM. Patient does have callus present located at the left plantar foot sub 2nd and 5th met head causing pain.  Patient does have some level of numbness in the feet.  She does take insulin.   Objective:  Physical Exam: warm to cool, good capillary refill, pedal skin atrophic nail exam onychomycosis of the toenails, onycholysis, and dystrophic nails protective sensation absent DP pulses are very weakly palpable nonpalpable PT pulses cap refill intact all toes. Left Foot:  Pain with palpation of nails due to elongation and dystrophic growth.  Painful calluses present left subsecond, left subfifth metatarsal heads. Right Foot: Pain with palpation of nails due to elongation and dystrophic growth.   Assessment:   1. Pre-ulcerative calluses   2. DM type 2 with diabetic peripheral neuropathy (HCC)   3. Pain due to onychomycosis of toenails of both feet        Plan:  Patient was evaluated and treated and all questions answered.  #Hyperkeratotic lesions/pre ulcerative calluses present subsecond metatarsal head and subfifth metatarsal head on the left foot All symptomatic hyperkeratoses x 2 separate lesions were safely debrided with a sterile #312 blade to patient's level of comfort without incident. We discussed preventative and palliative care of these lesions including supportive and accommodative shoegear, padding, prefabricated and custom molded accommodative orthoses, use of a pumice stone and lotions/creams  daily.  #Onychomycosis with pain  -Nails palliatively debrided as below. -Educated on self-care  Procedure: Nail Debridement Rationale: Pain Type of Debridement: manual, sharp debridement. Instrumentation: Nail nipper, rotary burr. Number of Nails: 10  Patient educated on diabetes. Discussed proper diabetic foot care and discussed risks and complications of disease. Educated patient in depth on reasons to return to the office immediately should he/she discover anything concerning or new on the feet. All questions answered. Discussed proper shoes as well.    Return in about 3 months (around 08/30/2023) for Diabetic Foot Care.         Bronwen Betters, DPM [facility] / [facility]

## 2023-05-31 NOTE — Patient Instructions (Signed)
 Look for urea 40% cream or ointment and apply to the thickened dry skin / calluses. This can be bought over the counter, at a pharmacy or online such as [employer].  You can also scrub the calluses with white vinegar and this will make it easier to file them down using a pumice stone or emery board.

## 2023-06-06 ENCOUNTER — Other Ambulatory Visit: Payer: Self-pay | Admitting: Medical

## 2023-06-06 DIAGNOSIS — K219 Gastro-esophageal reflux disease without esophagitis: Secondary | ICD-10-CM

## 2023-06-07 ENCOUNTER — Ambulatory Visit (INDEPENDENT_AMBULATORY_CARE_PROVIDER_SITE_OTHER): Admitting: Medical

## 2023-06-07 VITALS — BP 130/78 | HR 92 | Wt 160.6 lb

## 2023-06-07 DIAGNOSIS — G8929 Other chronic pain: Secondary | ICD-10-CM | POA: Diagnosis not present

## 2023-06-07 DIAGNOSIS — J9611 Chronic respiratory failure with hypoxia: Secondary | ICD-10-CM | POA: Diagnosis not present

## 2023-06-07 DIAGNOSIS — M25562 Pain in left knee: Secondary | ICD-10-CM

## 2023-06-07 DIAGNOSIS — I270 Primary pulmonary hypertension: Secondary | ICD-10-CM | POA: Diagnosis not present

## 2023-06-07 NOTE — Progress Notes (Signed)
 Subjective:  Jessica Hart is a 83 y.o. female who presents for Chief Complaint  Patient presents with   Discuss MRI    Discuss MRI and knee pain     Here for about a month history of pain left knee, more on inside of knee.  No fall, no injury.  Has been a little swelling.  No numbness or tingling.  Decades ago had shot of steroid in knee.   Using some tylenol for pain.  This has gradually worsened in past month.  Using stationary bike helps some.  Used some ben gay topically.   No other aggravating or relieving factors.    No other c/o.  Past Medical History:  Diagnosis Date   Arthritis    Cataract bilaterl 3 years ago   Coronary artery disease    Diabetes mellitus 02/27/2005   Diabetes mellitus type 2, controlled, without complications (HCC) 04/26/2006   Qualifier: Diagnosis of  By: Haydee Salter     Diverticulosis    Dyspnea    Echocardiogram findings abnormal, without diagnosis 2005   EF 47%, no ischemia, no infarction   Former smoker 01/10/2017   45 pack year history, quit in 2007    GERD (gastroesophageal reflux disease)    Heart murmur    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   History of bone density study 2006   Lt hip = -1.0, L spine = -1.8    Hyperlipidemia    Hypertension    Current Outpatient Medications on File Prior to Visit  Medication Sig Dispense Refill   aspirin EC 81 MG tablet Take 1 tablet (81 mg total) by mouth daily. 90 tablet 3   Cholecalciferol (D3 5000) 125 MCG (5000 UT) capsule Take 5,000 Units by mouth daily.     CREON 36000-114000 units CPEP capsule TAKE 2 CAPSULES BY MOUTH 3 TIMES A DAY WITH MEALS AND TAKE 1 CAPSULE WITH EACH SNACK TWICE DAILY 300 capsule 9   dapagliflozin propanediol (FARXIGA) 5 MG TABS tablet Take 1 tablet (5 mg total) by mouth daily. 90 tablet 1   estradiol (ESTRACE) 0.1 MG/GM vaginal cream Pea size amount per vagina 1-2 times per week 42.5 g 2   furosemide (LASIX) 20 MG tablet Take 3 tablets (60 mg total) by mouth daily. 270 tablet  2   insulin degludec (TRESIBA FLEXTOUCH) 100 UNIT/ML FlexTouch Pen Inject 15 Units into the skin daily after supper. 30 day supply voucher do not bill insurance BIN [phone] PCN [employer] [id number] ID [id number] 15 mL 0   losartan (COZAAR) 50 MG tablet Take 1 tablet (50 mg total) by mouth daily. 90 tablet 1   macitentan (OPSUMIT) 10 MG tablet TAKE 1 TABLET BY MOUTH 1 TIME A DAY. DO NOT HANDLE IF PREGNANT. DO NOT SPLIT, CRUSH, OR CHEW. REVIEW MEDICATION GUIDE 30 tablet 10   metFORMIN (GLUCOPHAGE-XR) 750 MG 24 hr tablet TAKE 1 TABLET BY MOUTH EVERY DAY WITH BREAKFAST 90 tablet 1   metoprolol tartrate (LOPRESSOR) 25 MG tablet Take 1 tablet (25 mg total) by mouth 2 (two) times daily. Take 25 mg by mouth 2 (two) times daily. 180 tablet 1   Multiple Vitamins-Minerals (NO IRON MULT VITAMIN-MINERALS) TABS TAKE 1 TABLET BY MOUTH DAILY (Patient taking differently: Take 1 tablet by mouth daily.) 30 tablet 0   omeprazole (PRILOSEC) 40 MG capsule TAKE 1 CAPSULE (40 MG TOTAL) BY MOUTH DAILY. 90 capsule 0   potassium chloride (KLOR-CON) 10 MEQ tablet TAKE 2 TABLETS BY MOUTH DAILY 180 tablet 1  rosuvastatin (CRESTOR) 20 MG tablet TAKE 1 TABLET BY MOUTH EVERY DAY 90 tablet 1   tadalafil, PAH, (ADCIRCA) 20 MG tablet TAKE 2 TABLETS BY MOUTH 1 TIME A DAY 60 tablet 10   TRELEGY ELLIPTA 100-62.5-25 MCG/ACT AEPB INHALE 1 PUFF INTO THE LUNGS DAILY 180 each 3   vitamin B-12 (CYANOCOBALAMIN) 500 MCG tablet Take 500 mcg by mouth daily.     acetaminophen (TYLENOL) 500 MG tablet Take 2 tablets (1,000 mg total) by mouth every 8 (eight) hours as needed. 30 tablet 2   Blood Glucose Monitoring Suppl DEVI Test 1-2 times day. Pt needs onetouch meter 1 each 0   Glucose Blood (BLOOD GLUCOSE TEST STRIPS) STRP Test 1-2 times daily. Needs onetouch strips 100 strip 1   Insulin Pen Needle (PEN NEEDLES) 31G X 5 MM MISC Use for your tresiba 100 each 0   Lancet Device MISC Use device to test bloodsugar with lancet 1 each 0   Lancets Misc.  MISC 1-2 times a day. Needs one touch lancets 100 each 1   Misc. Devices MISC SIG: 1 L of Oxygen via Nasal cannula around the clock Dispense Home Oxygen and Portable tank and necessary supplies. Dx: Hypoxemia, Shortness of breath (Patient taking differently: Place 4-4.5 L into the nose See admin instructions. 4 - 4.5 L of Oxygen via Nasal cannula around the clock Dispense Home Oxygen and Portable tank and necessary supplies. Dx: Hypoxemia, Shortness of breath) 1 Device 0   ONETOUCH VERIO test strip SMARTSIG:1 Each Via Meter Daily     No current facility-administered medications on file prior to visit.   Past Surgical History:  Procedure Laterality Date   ABDOMINAL HYSTERECTOMY  1989   one ovary remains per patient   CARDIAC CATHETERIZATION  08/20/2019   COLONOSCOPY     CORONARY ARTERY BYPASS GRAFT N/A 09/02/2019   Procedure: CORONARY ARTERY BYPASS GRAFTING (CABG) TIMES THREE USING LEFT INTERNAL MAMMARY ARTERY AND RIGHT GREATER SAPHENOUS VEIN;  Surgeon: Corliss Skains, MD;  Location: [facility] OR;  Service: Open Heart Surgery;  Laterality: N/A;   ENDOVEIN HARVEST OF GREATER SAPHENOUS VEIN Right 09/02/2019   Procedure: ENDOVEIN HARVEST OF GREATER SAPHENOUS VEIN;  Surgeon: Corliss Skains, MD;  Location: [facility] OR;  Service: Open Heart Surgery;  Laterality: Right;   EYE SURGERY Bilateral    CATARACT   LEFT HEART CATH AND CORONARY ANGIOGRAPHY N/A 08/20/2019   Procedure: LEFT HEART CATH AND CORONARY ANGIOGRAPHY;  Surgeon: Lyn Records, MD;  Location: [facility];  Service: Cardiovascular;  Laterality: N/A;   RIGHT AND LEFT HEART CATH N/A 07/13/2021   Procedure: RIGHT AND LEFT HEART CATH;  Surgeon: Orbie Pyo, MD;  Location: [facility];  Service: Cardiovascular;  Laterality: N/A;   TEE WITHOUT CARDIOVERSION N/A 09/02/2019   Procedure: TRANSESOPHAGEAL ECHOCARDIOGRAM (TEE);  Surgeon: Corliss Skains, MD;  Location: [facility] OR;  Service: Open Heart Surgery;  Laterality: N/A;    TONSILECTOMY, ADENOIDECTOMY, BILATERAL MYRINGOTOMY AND TUBES  1965   TONSILLECTOMY       The following portions of the patient's history were reviewed and updated as appropriate: allergies, current medications, past family history, past medical history, past social history, past surgical history and problem list.  ROS Otherwise as in subjective above    Objective: BP 130/78   Pulse 92   Wt 160 lb 9.6 oz (72.8 kg)   BMI 28.45 kg/m   General appearance: alert, no distress, well developed, well nourished Seated with oxygen through  nasal cannula ZOX:WRUE puffy swelling of left medial knee, mild tendnerss over medial joint line and medial tibia, no laxity, otherwise nontender, normal leg ROM, hips nontender with normal ROM. No LE edema     Assessment: Encounter Diagnosis  Name Primary?   Chronic pain of left knee Yes     Plan: I reviewed her x-ray from April 2022.  She had tricompartmental arthritis and decreased joint space in the medial side.  I suspect this is worsening.  She can continue over-the-counter Tylenol 500 mg twice a day prn as she is doing, can continue topical remedies such as Aspercreme or horse or capsaicin cream.  Referral to sports medicine for further evaluation and likely steroid injection.  She is not a surgical candidate at this time  Avyanna was seen today for discuss mri.  Diagnoses and all orders for this visit:  Chronic pain of left knee -     AMB referral to sports medicine    Follow up: pending referral

## 2023-06-11 ENCOUNTER — Ambulatory Visit: Payer: PPO | Admitting: Obstetrics and Gynecology

## 2023-06-11 ENCOUNTER — Encounter: Payer: Self-pay | Admitting: Obstetrics and Gynecology

## 2023-06-11 VITALS — BP 110/60 | HR 80 | Temp 98.1°F | Ht 64.0 in | Wt 154.0 lb

## 2023-06-11 DIAGNOSIS — N904 Leukoplakia of vulva: Secondary | ICD-10-CM | POA: Diagnosis not present

## 2023-06-11 DIAGNOSIS — L280 Lichen simplex chronicus: Secondary | ICD-10-CM

## 2023-06-11 DIAGNOSIS — N952 Postmenopausal atrophic vaginitis: Secondary | ICD-10-CM | POA: Diagnosis not present

## 2023-06-11 MED ORDER — CLOBETASOL PROPIONATE 0.05 % EX OINT: TOPICAL_OINTMENT | CUTANEOUS | 5 refills | Status: DC

## 2023-06-11 NOTE — Patient Instructions (Signed)
 Avoid scented soaps, lotions, and menstrual products, perfumes. Also avoid douching and applying any products directly into the vagina.   Considering applying aquaphor or coconut oil to the vulva after showers. You could also using daily vaginal moisturizers by brands like Good Clean Love and Ah! Yes.

## 2023-06-11 NOTE — Progress Notes (Signed)
 83 y.o. 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  No LMP recorded. Patient has had a hysterectomy.   She reports a lot of irritations, has been scratching. Sometimes she scratches so hard that she bleeds at times.   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     OB History  Gravida Para Term Preterm AB Living  1 1    1   SAB IAB Ectopic Multiple Live Births          # Outcome Date GA Lbr Len/2nd Weight Sex Type Anes PTL Lv  1 Para             Past Medical History:  Diagnosis Date   Arthritis    Cataract bilaterl 3 years ago   Coronary artery disease    Diabetes mellitus 02/27/2005   Diabetes mellitus type 2, controlled, without complications (HCC) 04/26/2006   Qualifier: Diagnosis of  By: Haydee Salter     Diverticulosis    Dyspnea    Echocardiogram findings abnormal, without diagnosis 2005   EF 47%, no ischemia, no infarction   Former smoker 01/10/2017   45 pack year history, quit in 2007    GERD (gastroesophageal reflux disease)    Heart murmur    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   History of bone density study 2006   Lt hip = -1.0, L spine = -1.8    Hyperlipidemia    Hypertension     Past Surgical History:  Procedure Laterality Date   ABDOMINAL HYSTERECTOMY  1989   one ovary remains per patient   CARDIAC CATHETERIZATION  08/20/2019   COLONOSCOPY     CORONARY ARTERY BYPASS GRAFT N/A 09/02/2019   Procedure: CORONARY ARTERY BYPASS GRAFTING (CABG) TIMES THREE USING LEFT INTERNAL MAMMARY ARTERY AND RIGHT GREATER SAPHENOUS VEIN;  Surgeon: Corliss Skains, MD;  Location: [facility] OR;  Service: Open Heart Surgery;  Laterality: N/A;   ENDOVEIN HARVEST OF GREATER SAPHENOUS VEIN Right 09/02/2019   Procedure: ENDOVEIN HARVEST OF GREATER SAPHENOUS VEIN;  Surgeon: Corliss Skains, MD;  Location: [facility] OR;  Service: Open  Heart Surgery;  Laterality: Right;   EYE SURGERY Bilateral    CATARACT   LEFT HEART CATH AND CORONARY ANGIOGRAPHY N/A 08/20/2019   Procedure: LEFT HEART CATH AND CORONARY ANGIOGRAPHY;  Surgeon: Lyn Records, MD;  [facility];  Service: Cardiovascular;  Laterality: N/A;   RIGHT AND LEFT HEART CATH N/A 07/13/2021   Procedure: RIGHT AND LEFT HEART CATH;  Surgeon: Orbie Pyo, MD;  [facility];  Service: Cardiovascular;  Laterality: N/A;   TEE WITHOUT CARDIOVERSION N/A 09/02/2019   Procedure: TRANSESOPHAGEAL ECHOCARDIOGRAM (TEE);  Surgeon: Corliss Skains, MD;  Location[facility];  Service: Open Heart Surgery;  Laterality: N/A;   TONSILECTOMY, ADENOIDECTOMY, BILATERAL MYRINGOTOMY AND TUBES  1965   TONSILLECTOMY      Current Outpatient Medications on File Prior to Visit  Medication Sig Dispense Refill   acetaminophen (TYLENOL) 500 MG tablet Take 2 tablets (1,000 mg total) by mouth every 8 (eight) hours as needed. 30 tablet 2   aspirin EC 81 MG tablet Take 1 tablet (81 mg total) by mouth daily. 90 tablet 3   Blood Glucose Monitoring Suppl DEVI Test 1-2 times day. Pt needs onetouch meter 1 each 0   Cholecalciferol (D3 5000) 125 MCG (5000 UT) capsule Take 5,000  Units by mouth daily.     CREON 36000-114000 units CPEP capsule TAKE 2 CAPSULES BY MOUTH 3 TIMES A DAY WITH MEALS AND TAKE 1 CAPSULE WITH EACH SNACK TWICE DAILY 300 capsule 9   dapagliflozin propanediol (FARXIGA) 5 MG TABS tablet Take 1 tablet (5 mg total) by mouth daily. 90 tablet 1   furosemide (LASIX) 20 MG tablet Take 3 tablets (60 mg total) by mouth daily. 270 tablet 2   Glucose Blood (BLOOD GLUCOSE TEST STRIPS) STRP Test 1-2 times daily. Needs onetouch strips 100 strip 1   insulin degludec (TRESIBA FLEXTOUCH) 100 UNIT/ML FlexTouch Pen Inject 15 Units into the skin daily after supper. 30 day supply voucher do not bill insurance BIN [id number] PCN [employer] [id number] ID [id number] 15 mL 0   Insulin Pen  Needle (PEN NEEDLES) 31G X 5 MM MISC Use for your tresiba 100 each 0   Lancet Device MISC Use device to test bloodsugar with lancet 1 each 0   Lancets Misc. MISC 1-2 times a day. Needs one touch lancets 100 each 1   losartan (COZAAR) 50 MG tablet Take 1 tablet (50 mg total) by mouth daily. 90 tablet 1   macitentan (OPSUMIT) 10 MG tablet TAKE 1 TABLET BY MOUTH 1 TIME A DAY. DO NOT HANDLE IF PREGNANT. DO NOT SPLIT, CRUSH, OR CHEW. REVIEW MEDICATION GUIDE 30 tablet 10   metFORMIN (GLUCOPHAGE-XR) 750 MG 24 hr tablet TAKE 1 TABLET BY MOUTH EVERY DAY WITH BREAKFAST 90 tablet 1   metoprolol tartrate (LOPRESSOR) 25 MG tablet Take 1 tablet (25 mg total) by mouth 2 (two) times daily. Take 25 mg by mouth 2 (two) times daily. 180 tablet 1   Misc. Devices MISC SIG: 1 L of Oxygen via Nasal cannula around the clock Dispense Home Oxygen and Portable tank and necessary supplies. Dx: Hypoxemia, Shortness of breath (Patient taking differently: Place 4-4.5 L into the nose See admin instructions. 4 - 4.5 L of Oxygen via Nasal cannula around the clock Dispense Home Oxygen and Portable tank and necessary supplies. Dx: Hypoxemia, Shortness of breath) 1 Device 0   Multiple Vitamins-Minerals (NO IRON MULT VITAMIN-MINERALS) TABS TAKE 1 TABLET BY MOUTH DAILY (Patient taking differently: Take 1 tablet by mouth daily.) 30 tablet 0   omeprazole (PRILOSEC) 40 MG capsule TAKE 1 CAPSULE (40 MG TOTAL) BY MOUTH DAILY. 90 capsule 0   ONETOUCH VERIO test strip SMARTSIG:1 Each Via Meter Daily     potassium chloride (KLOR-CON) 10 MEQ tablet TAKE 2 TABLETS BY MOUTH DAILY 180 tablet 1   rosuvastatin (CRESTOR) 20 MG tablet TAKE 1 TABLET BY MOUTH EVERY DAY 90 tablet 1   tadalafil, PAH, (ADCIRCA) 20 MG tablet TAKE 2 TABLETS BY MOUTH 1 TIME A DAY 60 tablet 10   TRELEGY ELLIPTA 100-62.5-25 MCG/ACT AEPB INHALE 1 PUFF INTO THE LUNGS DAILY 180 each 3   vitamin B-12 (CYANOCOBALAMIN) 500 MCG tablet Take 500 mcg by mouth daily.     estradiol (ESTRACE)  0.1 MG/GM vaginal cream Pea size amount per vagina 1-2 times per week (Patient not taking: Reported on 06/11/2023) 42.5 g 2   No current facility-administered medications on file prior to visit.    Allergies  Allergen Reactions   Lisinopril Itching and Cough      PE Today's Vitals   06/11/23 1349  BP: 110/60  Pulse: 80  Temp: 98.1 F (36.7 C)  TempSrc: Oral  SpO2: 92%  Weight: 154 lb (69.9 kg)  Height: 5\' 4"  (1.626 m)  Body mass index is 26.43 kg/m.  Physical Exam Vitals reviewed. Exam conducted with a chaperone present.  Constitutional:      General: She is not in acute distress.    Appearance: Normal appearance.  HENT:     Head: Normocephalic and atraumatic.     Nose: Nose normal.  Eyes:     Extraocular Movements: Extraocular movements intact.     Conjunctiva/sclera: Conjunctivae normal.  Pulmonary:     Effort: Pulmonary effort is normal.  Genitourinary:    General: Normal vulva.     Exam position: Lithotomy position.     Vagina: Normal. No vaginal discharge.     Uterus: Absent.      Adnexa: Right adnexa normal and left adnexa normal.       Comments: Bilateral erythema of labia minor, expanding onto labia major Lichenification and watch patches present Cervix and uterus absent Musculoskeletal:        General: Normal range of motion.     Cervical back: Normal range of motion.  Neurological:     General: No focal deficit present.     Mental Status: She is alert.  Psychiatric:        Mood and Affect: Mood normal.        Behavior: Behavior normal.       Assessment and Plan:        Lichen simplex chronicus -     Clobetasol Propionate; Apply thin layer daily to affected area for 12 weeks.  Dispense: 60 g; Refill: 5  Vaginal atrophy  Safe to continue PV estrogen. Apply aquaphor, vaseline or coconut oil to the vulva after showers.  RTO in 12 weeks  Romaine Closs, MD

## 2023-06-12 ENCOUNTER — Encounter: Payer: Self-pay | Admitting: Family Medicine

## 2023-06-12 ENCOUNTER — Ambulatory Visit: Admitting: Family Medicine

## 2023-06-12 VITALS — BP 132/78 | Ht 63.5 in | Wt 155.0 lb

## 2023-06-12 DIAGNOSIS — M1712 Unilateral primary osteoarthritis, left knee: Secondary | ICD-10-CM | POA: Diagnosis not present

## 2023-06-12 MED ORDER — METHYLPREDNISOLONE ACETATE 40 MG/ML IJ SUSP
40.0000 mg | Freq: Once | INTRAMUSCULAR | Status: AC
  Administered 2023-06-12: 40 mg via INTRA_ARTICULAR

## 2023-06-12 NOTE — Addendum Note (Signed)
 Addended by: Ginny Lair on: 06/12/2023 02:31 PM   Modules accepted: Orders

## 2023-06-12 NOTE — Progress Notes (Signed)
 PCP: Jac Canavan, PA-C  Chief Complaint: left knee pain  Subjective:   HPI: Patient is a 83 y.o. female here for left knee pain.  Patient states that she has been having knee pain on and off for a while now.  Patient is been taking Tylenol as needed for the pain as well as some topical creams.  Patient notes that she had an injection in the past but is not sure what they injected.  Patient states that she did feel better after that.  Patient does have a history of COPD requiring oxygen and states that she is not planning on having surgery at this time if she can avoid it.  Patient does not wear any braces or sleeves.  Patient otherwise has no other concerns at this time.  Past Medical History:  Diagnosis Date   Arthritis    Cataract bilaterl 3 years ago   Coronary artery disease    Diabetes mellitus 02/27/2005   Diabetes mellitus type 2, controlled, without complications (HCC) 04/26/2006   Qualifier: Diagnosis of  By: Haydee Salter     Diverticulosis    Dyspnea    Echocardiogram findings abnormal, without diagnosis 2005   EF 47%, no ischemia, no infarction   Former smoker 01/10/2017   45 pack year history, quit in 2007    GERD (gastroesophageal reflux disease)    Heart murmur    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   History of bone density study 2006   Lt hip = -1.0, L spine = -1.8    Hyperlipidemia    Hypertension     Current Outpatient Medications on File Prior to Visit  Medication Sig Dispense Refill   acetaminophen (TYLENOL) 500 MG tablet Take 2 tablets (1,000 mg total) by mouth every 8 (eight) hours as needed. 30 tablet 2   aspirin EC 81 MG tablet Take 1 tablet (81 mg total) by mouth daily. 90 tablet 3   Blood Glucose Monitoring Suppl DEVI Test 1-2 times day. Pt needs onetouch meter 1 each 0   Cholecalciferol (D3 5000) 125 MCG (5000 UT) capsule Take 5,000 Units by mouth daily.     clobetasol ointment (TEMOVATE) 0.05 % Apply thin layer daily to affected area for 12 weeks. 60 g 5    CREON 36000-114000 units CPEP capsule TAKE 2 CAPSULES BY MOUTH 3 TIMES A DAY WITH MEALS AND TAKE 1 CAPSULE WITH EACH SNACK TWICE DAILY 300 capsule 9   dapagliflozin propanediol (FARXIGA) 5 MG TABS tablet Take 1 tablet (5 mg total) by mouth daily. 90 tablet 1   estradiol (ESTRACE) 0.1 MG/GM vaginal cream Pea size amount per vagina 1-2 times per week (Patient not taking: Reported on 06/11/2023) 42.5 g 2   furosemide (LASIX) 20 MG tablet Take 3 tablets (60 mg total) by mouth daily. 270 tablet 2   Glucose Blood (BLOOD GLUCOSE TEST STRIPS) STRP Test 1-2 times daily. Needs onetouch strips 100 strip 1   insulin degludec (TRESIBA FLEXTOUCH) 100 UNIT/ML FlexTouch Pen Inject 15 Units into the skin daily after supper. 30 day supply voucher do not bill insurance BIN [phone] [employer]N [employer] [id number] ID [id number] 15 mL 0   Insulin Pen Needle (PEN NEEDLES) 31G X 5 MM MISC Use for your tresiba 100 each 0   Lancet Device MISC Use device to test bloodsugar with lancet 1 each 0   Lancets Misc. MISC 1-2 times a day. Needs one touch lancets 100 each 1   losartan (COZAAR) 50 MG tablet Take 1 tablet (50  mg total) by mouth daily. 90 tablet 1   macitentan (OPSUMIT) 10 MG tablet TAKE 1 TABLET BY MOUTH 1 TIME A DAY. DO NOT HANDLE IF PREGNANT. DO NOT SPLIT, CRUSH, OR CHEW. REVIEW MEDICATION GUIDE 30 tablet 10   metFORMIN (GLUCOPHAGE-XR) 750 MG 24 hr tablet TAKE 1 TABLET BY MOUTH EVERY DAY WITH BREAKFAST 90 tablet 1   metoprolol tartrate (LOPRESSOR) 25 MG tablet Take 1 tablet (25 mg total) by mouth 2 (two) times daily. Take 25 mg by mouth 2 (two) times daily. 180 tablet 1   Misc. Devices MISC SIG: 1 L of Oxygen via Nasal cannula around the clock Dispense Home Oxygen and Portable tank and necessary supplies. Dx: Hypoxemia, Shortness of breath (Patient taking differently: Place 4-4.5 L into the nose See admin instructions. 4 - 4.5 L of Oxygen via Nasal cannula around the clock Dispense Home Oxygen and Portable tank and necessary  supplies. Dx: Hypoxemia, Shortness of breath) 1 Device 0   Multiple Vitamins-Minerals (NO IRON MULT VITAMIN-MINERALS) TABS TAKE 1 TABLET BY MOUTH DAILY (Patient taking differently: Take 1 tablet by mouth daily.) 30 tablet 0   omeprazole (PRILOSEC) 40 MG capsule TAKE 1 CAPSULE (40 MG TOTAL) BY MOUTH DAILY. 90 capsule 0   ONETOUCH VERIO test strip SMARTSIG:1 Each Via Meter Daily     potassium chloride (KLOR-CON) 10 MEQ tablet TAKE 2 TABLETS BY MOUTH DAILY 180 tablet 1   rosuvastatin (CRESTOR) 20 MG tablet TAKE 1 TABLET BY MOUTH EVERY DAY 90 tablet 1   tadalafil, PAH, (ADCIRCA) 20 MG tablet TAKE 2 TABLETS BY MOUTH 1 TIME A DAY 60 tablet 10   TRELEGY ELLIPTA 100-62.5-25 MCG/ACT AEPB INHALE 1 PUFF INTO THE LUNGS DAILY 180 each 3   vitamin B-12 (CYANOCOBALAMIN) 500 MCG tablet Take 500 mcg by mouth daily.     No current facility-administered medications on file prior to visit.    Past Surgical History:  Procedure Laterality Date   ABDOMINAL HYSTERECTOMY  1989   one ovary remains per patient   CARDIAC CATHETERIZATION  08/20/2019   COLONOSCOPY     CORONARY ARTERY BYPASS GRAFT N/A 09/02/2019   Procedure: CORONARY ARTERY BYPASS GRAFTING (CABG) TIMES THREE USING LEFT INTERNAL MAMMARY ARTERY AND RIGHT GREATER SAPHENOUS VEIN;  Surgeon: Hilarie Lovely, MD;  Location: [facility] OR;  Service: Open Heart Surgery;  Laterality: N/A;   ENDOVEIN HARVEST OF GREATER SAPHENOUS VEIN Right 09/02/2019   Procedure: ENDOVEIN HARVEST OF GREATER SAPHENOUS VEIN;  Surgeon: Hilarie Lovely, MD;  Location: [facility] OR;  Service: Open Heart Surgery;  Laterality: Right;   EYE SURGERY Bilateral    CATARACT   LEFT HEART CATH AND CORONARY ANGIOGRAPHY N/A 08/20/2019   Procedure: LEFT HEART CATH AND CORONARY ANGIOGRAPHY;  Surgeon: Arty Binning, MD;  Location: [facility];  Service: Cardiovascular;  Laterality: N/A;   RIGHT AND LEFT HEART CATH N/A 07/13/2021   Procedure: RIGHT AND LEFT HEART CATH;  Surgeon: Kyra Phy, MD;   Location: [facility];  Service: Cardiovascular;  Laterality: N/A;   TEE WITHOUT CARDIOVERSION N/A 09/02/2019   Procedure: TRANSESOPHAGEAL ECHOCARDIOGRAM (TEE);  Surgeon: Hilarie Lovely, MD;  [facility] OR;  Service: Open Heart Surgery;  Laterality: N/A;   TONSILECTOMY, ADENOIDECTOMY, BILATERAL MYRINGOTOMY AND TUBES  1965   TONSILLECTOMY      Allergies  Allergen Reactions   Lisinopril Itching and Cough    BP 132/78   Ht 5' 3.5" (1.613 m)   Wt 155 lb (70.3 kg)  BMI 27.03 kg/m       No data to display              No data to display              Objective:  Physical Exam:  Gen: NAD, comfortable in exam room  Inspection of the left knee shows an effusion, mild sized.  There is some tenderness to palpation over the medial joint line.  Range of motion is full with flexion and extension though there is some decreased flexion noted.  Patient has noted grinding and cracking of the patella kneecap.  Patient is able to ambulate without any difficulty.   Assessment & Plan:  1. 1. Primary osteoarthritis of left knee (Primary) - Patient's knee pain is likely related to osteoarthritic changes.  Did discuss options at this time with patient, patient would like to try and delay surgery as she may not be a great surgical candidate. - Will go ahead and do steroid injection of the knee today.  Also advised patient to wear a knee sleeve.  Patient can follow-up as needed or in 3 months for repeat injection  PROCEDURE:  Risks & benefits of left knee cortisone injection reviewed. Consent obtained. Time-out completed. Patient prepped and draped in the normal fashion. Area cleansed with alcohol. Ethyl chloride spray used to anesthetize the skin. Solution of 4 mL 1% lidocaine with 1 mL methylprednisolone (Depo-medrol) 40mg /mL injected into the left knee using a 22-gauge 1.5-inch needle via the anterior lateral approach. Patient tolerated procedure well without any complications. Area  covered with adhesive bandage.  Post-procedure care reviewed, all questions answered.     Nikkole Placzek MD, PGY-4  Sports Medicine Fellow [facility]

## 2023-06-13 NOTE — Addendum Note (Signed)
 Addended by: Marvel Slicker C on: 06/13/2023 11:30 AM   Modules accepted: Level of Service

## 2023-06-13 NOTE — Patient Instructions (Signed)

## 2023-06-16 ENCOUNTER — Other Ambulatory Visit: Payer: Self-pay | Admitting: Medical

## 2023-06-19 NOTE — Progress Notes (Signed)
 Please call about her MRA results.  She does have cholesterol plaque or atherosclerosis in several areas going into the brain.  The carotids do not have any major blockage that would require surgery.  Lets see her back soon to discuss this findings and the symptoms she was concerned about recently.

## 2023-06-20 ENCOUNTER — Other Ambulatory Visit: Payer: Self-pay | Admitting: Medical

## 2023-06-22 ENCOUNTER — Telehealth: Payer: Self-pay

## 2023-06-22 NOTE — Telephone Encounter (Signed)
 Received a request for a new rx for  Farxiga  to be fax to [facility] at [phone]

## 2023-06-27 ENCOUNTER — Ambulatory Visit: Admitting: Medical

## 2023-06-27 VITALS — BP 130/70 | HR 90 | Wt 156.2 lb

## 2023-06-27 DIAGNOSIS — E113393 Type 2 diabetes mellitus with moderate nonproliferative diabetic retinopathy without macular edema, bilateral: Secondary | ICD-10-CM

## 2023-06-27 DIAGNOSIS — E1169 Type 2 diabetes mellitus with other specified complication: Secondary | ICD-10-CM | POA: Diagnosis not present

## 2023-06-27 DIAGNOSIS — E1165 Type 2 diabetes mellitus with hyperglycemia: Secondary | ICD-10-CM | POA: Diagnosis not present

## 2023-06-27 DIAGNOSIS — H34232 Retinal artery branch occlusion, left eye: Secondary | ICD-10-CM

## 2023-06-27 DIAGNOSIS — I152 Hypertension secondary to endocrine disorders: Secondary | ICD-10-CM

## 2023-06-27 DIAGNOSIS — I2510 Atherosclerotic heart disease of native coronary artery without angina pectoris: Secondary | ICD-10-CM | POA: Diagnosis not present

## 2023-06-27 DIAGNOSIS — H35039 Hypertensive retinopathy, unspecified eye: Secondary | ICD-10-CM | POA: Diagnosis not present

## 2023-06-27 DIAGNOSIS — E1159 Type 2 diabetes mellitus with other circulatory complications: Secondary | ICD-10-CM | POA: Diagnosis not present

## 2023-06-27 DIAGNOSIS — J439 Emphysema, unspecified: Secondary | ICD-10-CM

## 2023-06-27 DIAGNOSIS — Z951 Presence of aortocoronary bypass graft: Secondary | ICD-10-CM

## 2023-06-27 DIAGNOSIS — I2721 Secondary pulmonary arterial hypertension: Secondary | ICD-10-CM | POA: Diagnosis not present

## 2023-06-27 DIAGNOSIS — L659 Nonscarring hair loss, unspecified: Secondary | ICD-10-CM

## 2023-06-27 DIAGNOSIS — E785 Hyperlipidemia, unspecified: Secondary | ICD-10-CM | POA: Diagnosis not present

## 2023-06-27 LAB — POCT GLYCOSYLATED HEMOGLOBIN (HGB A1C): Hemoglobin A1C: 5.7 % — AB (ref 4.0–5.6)

## 2023-06-27 NOTE — Progress Notes (Signed)
 Subjective:  Jessica Hart is a 83 y.o. female who presents for Chief Complaint  Patient presents with   Consult    Discuss MRA results     Medical team: Dr. Jules Oar, heart failure clinic Dr. Orbie Binder, pulmonology Dr. Grady Lawman, cardiology Dr. Albert Huff, ophthalmology Dr. Ronnell Coins, ortho Dr. Kavitha Nandigam, GI Dr. Starling Eck, ENT Dr. Russ Course, podiatry [place] Ess, Christiane Cowing, PA-C here with primary care  Here to discuss head and neck imaging from visit.  Our office had received a call back in February 2025 from [facility]  They wanted her to have a stroke clinic imaging of the head and neck with MRA.  I reviewed [facility] notes from her April 10, 2023 visit.  At that time they noted moderate nonproliferative diabetic retinopathy, type 2 diabetes with ocular complications, present, branch retinal artery occlusion, hypertensive retinopathy, arteriosclerosis posterior vitreous detachment  She denies any current fall, dizziness, confusion.  Her memory maybe is not as good as it used to be but she still feels like she can manage things just fine.  She does use oxygen  around-the-clock day and night, 4 L/min.  She has concerned about her loss.  She has her loss specifically on top of her head.  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  She was curious if there is anything she can use for this.  She uses wigs  No new vision concerns.  She is compliant with her medications.  No other aggravating or relieving factors.    No other c/o.  Past Medical History:  Diagnosis Date   Arthritis    Cataract bilaterl 3 years ago   Coronary artery disease    Diabetes mellitus 02/27/2005   Diabetes mellitus type 2, controlled, without complications (HCC) 04/26/2006   Qualifier: Diagnosis of  By: Eleni Griffin     Diverticulosis    Dyspnea    Echocardiogram findings abnormal, without diagnosis 2005   EF 47%, no ischemia, no  infarction   Former smoker 01/10/2017   45 pack year history, quit in 2007    GERD (gastroesophageal reflux disease)    Heart murmur    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   History of bone density study 2006   Lt hip = -1.0, L spine = -1.8    Hyperlipidemia    Hypertension    Current Outpatient Medications on File Prior to Visit  Medication Sig Dispense Refill   acetaminophen  (TYLENOL ) 500 MG tablet Take 2 tablets (1,000 mg total) by mouth every 8 (eight) hours as needed. 30 tablet 2   aspirin  EC 81 MG tablet Take 1 tablet (81 mg total) by mouth daily. 90 tablet 3   Cholecalciferol (D3 5000) 125 MCG (5000 UT) capsule Take 5,000 Units by mouth daily.     clobetasol  ointment (TEMOVATE ) 0.05 % Apply thin layer daily to affected area for 12 weeks. 60 g 5   CREON  36000-114000 units CPEP capsule TAKE 2 CAPSULES BY MOUTH 3 TIMES A DAY WITH MEALS AND TAKE 1 CAPSULE WITH EACH SNACK TWICE DAILY 300 capsule 9   dapagliflozin  propanediol (FARXIGA ) 5 MG TABS tablet Take 1 tablet (5 mg total) by mouth daily. 90 tablet 1   estradiol  (ESTRACE ) 0.1 MG/GM vaginal cream Pea size amount per vagina 1-2 times per week 42.5 g 2   furosemide  (LASIX ) 20 MG tablet Take 3 tablets (60 mg total) by mouth daily. 270 tablet 2   losartan  (COZAAR ) 50 MG tablet Take 1 tablet (50 mg  total) by mouth daily. 90 tablet 1   macitentan  (OPSUMIT ) 10 MG tablet TAKE 1 TABLET BY MOUTH 1 TIME A DAY. DO NOT HANDLE IF PREGNANT. DO NOT SPLIT, CRUSH, OR CHEW. REVIEW MEDICATION GUIDE 30 tablet 10   metFORMIN  (GLUCOPHAGE -XR) 750 MG 24 hr tablet TAKE 1 TABLET BY MOUTH EVERY DAY WITH BREAKFAST 90 tablet 0   metoprolol  tartrate (LOPRESSOR ) 25 MG tablet Take 1 tablet (25 mg total) by mouth 2 (two) times daily. Take 25 mg by mouth 2 (two) times daily. 180 tablet 1   omeprazole  (PRILOSEC) 40 MG capsule TAKE 1 CAPSULE (40 MG TOTAL) BY MOUTH DAILY. 90 capsule 0   potassium chloride  (KLOR-CON ) 10 MEQ tablet TAKE 2 TABLETS BY MOUTH DAILY 180 tablet 1    rosuvastatin  (CRESTOR ) 20 MG tablet TAKE 1 TABLET BY MOUTH EVERY DAY 90 tablet 1   tadalafil , PAH, (ADCIRCA ) 20 MG tablet TAKE 2 TABLETS BY MOUTH 1 TIME A DAY 60 tablet 10   TRELEGY ELLIPTA  100-62.5-25 MCG/ACT AEPB INHALE 1 PUFF INTO THE LUNGS DAILY 180 each 3   vitamin B-12 (CYANOCOBALAMIN) 500 MCG tablet Take 500 mcg by mouth daily.     Blood Glucose Monitoring Suppl DEVI Test 1-2 times day. Pt needs onetouch meter 1 each 0   Glucose Blood (BLOOD GLUCOSE TEST STRIPS) STRP Test 1-2 times daily. Needs onetouch strips 100 strip 1   insulin  degludec (TRESIBA  FLEXTOUCH) 100 UNIT/ML FlexTouch Pen Inject 15 Units into the skin daily after supper. 30 day supply voucher do not bill insurance BIN [phone] PCN [employer] Group [id number] ID [id number] 15 mL 0   Insulin  Pen Needle (PEN NEEDLES) 31G X 5 MM MISC Use for your tresiba  100 each 0   Lancet Device MISC Use device to test bloodsugar with lancet 1 each 0   Lancets Misc. MISC 1-2 times a day. Needs one touch lancets 100 each 1   Misc. Devices MISC SIG: 1 L of Oxygen  via Nasal cannula around the clock Dispense Home Oxygen  and Portable tank and necessary supplies. Dx: Hypoxemia, Shortness of breath (Patient taking differently: Place 4-4.5 L into the nose See admin instructions. 4 - 4.5 L of Oxygen  via Nasal cannula around the clock Dispense Home Oxygen  and Portable tank and necessary supplies. Dx: Hypoxemia, Shortness of breath) 1 Device 0   Multiple Vitamins-Minerals (NO IRON MULT VITAMIN-MINERALS) TABS TAKE 1 TABLET BY MOUTH DAILY (Patient taking differently: Take 1 tablet by mouth daily.) 30 tablet 0   ONETOUCH VERIO test strip SMARTSIG:1 Each Via Meter Daily     No current facility-administered medications on file prior to visit.     The following portions of the patient's history were reviewed and updated as appropriate: allergies, current medications, past family history, past medical history, past social history, past surgical history and problem  list.  ROS Otherwise as in subjective above    Objective: BP 130/70   Pulse 90   Wt 156 lb 3.2 oz (70.9 kg)   SpO2 97%   BMI 27.24 kg/m   Wt Readings from Last 3 Encounters:  06/27/23 156 lb 3.2 oz (70.9 kg)  06/12/23 155 lb (70.3 kg)  06/11/23 154 lb (69.9 kg)   BP Readings from Last 3 Encounters:  06/27/23 130/70  06/12/23 132/78  06/11/23 110/60    General appearance: alert, no distress, well developed, well nourished Skin: Generalized hair loss on the top of the scalp in a female pattern baldness pattern but there is also generalized hair thinning around the  perimeter as well HEENT: normocephalic, sclerae anicter Oral cavity: MMM, no lesions Neck: supple, no lymphadenopathy, no thyromegaly, no masses, no bruits Heart: RRR, normal S1, S2, no murmurs Lungs: CTA bilaterally, no wheezes, rhonchi, or rales Pulses: 2+ radial pulses, 2+ pedal pulses, normal cap refill Ext: no edema   MR angio neck without contast 05/15/23 IMPRESSION: Mild atheromatous change in the neck without flow reducing stenosis or luminal irregularity.   Intracranial atherosclerosis with up to advanced right V4 segment and moderate right M1 segment narrowings.   MR angio head 05/15/23 IMPRESSION: Mild atheromatous change in the neck without flow reducing stenosis or luminal irregularity.   Intracranial atherosclerosis with up to advanced right V4 segment and moderate right M1 segment narrowings.   MMSE 28/29 today    Assessment: Encounter Diagnoses  Name Primary?   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 We had received a call back in February 2025 from [facility].  They wanted her to have a stroke eval/imaging of the head and neck with MRA.  I reviewed [facility] notes from her April 10, 2023 visit.  At that time they noted moderate nonproliferative diabetic retinopathy, type 2 diabetes with ocular complications, present, branch retinal artery occlusion, hypertensive retinopathy, arteriosclerosis posterior vitreous detachment  Her blood pressure is reasonable.  She does see cardiology and pulmonology and has significant lung disease, pulmonary artery hypertension, hypertension.  Continue current medications.  Continue current medications including losartan  50 mg daily, tadalafil  20 mg, 2 tablets daily, metoprolol  25 mg twice daily,, Lasix  3 tablets daily along with potassium.  She continues on rosuvastatin  Crestor  20 mg daily and aspirin  81 mg daily.  Last lipid panel February 2025 with an LDL of 50.  Referral to vascular surgery for consult given the concern by her ophthalmologist and given the MRA findings.  Diabetes-at goal, hemoglobin A1c less than 6% today.  Continue Farxiga  5 mg daily.  Continue metformin  XR 750 mg daily.  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  We discussed potentially using Women's Rogaine but the hair loss is quite significant.  Offered referral to dermatology if she would like.  She declines for now.   Niyanna was seen today for consult.  Diagnoses and all orders for this visit:  Type 2 diabetes mellitus with hyperglycemia, unspecified whether long term  insulin  use (HCC) -     HgB A1c -     Ambulatory referral to Vascular Surgery  S/P CABG x 3  Pulmonary emphysema, unspecified emphysema type (HCC)  Pulmonary artery hypertension (HCC)  Hypertension associated with diabetes (HCC) -     Ambulatory referral to Vascular Surgery  Hyperlipidemia associated with type 2 diabetes mellitus (HCC) -  Ambulatory referral to Vascular Surgery  Coronary artery disease involving native coronary artery of native heart without angina pectoris  Alopecia  Moderate nonproliferative diabetic retinopathy of both eyes associated with type 2 diabetes mellitus, macular edema presence unspecified (HCC) -     Ambulatory referral to Vascular Surgery  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

## 2023-06-27 NOTE — Progress Notes (Signed)
 Pt was notified of results

## 2023-06-28 MED ORDER — PEN NEEDLES 33G X 4 MM MISC: 1 refills | Status: DC

## 2023-06-28 NOTE — Addendum Note (Signed)
 Addended by: Charliene Conte A on: 06/28/2023 10:16 AM   Modules accepted: Orders

## 2023-07-07 DIAGNOSIS — J9611 Chronic respiratory failure with hypoxia: Secondary | ICD-10-CM | POA: Diagnosis not present

## 2023-07-07 DIAGNOSIS — I270 Primary pulmonary hypertension: Secondary | ICD-10-CM | POA: Diagnosis not present

## 2023-08-01 ENCOUNTER — Ambulatory Visit: Attending: Vascular Surgery | Admitting: Physician Assistant

## 2023-08-01 ENCOUNTER — Encounter: Payer: Self-pay | Admitting: Vascular Surgery

## 2023-08-01 VITALS — BP 139/73 | HR 93 | Temp 98.2°F | Ht 63.5 in | Wt 158.0 lb

## 2023-08-01 DIAGNOSIS — I6521 Occlusion and stenosis of right carotid artery: Secondary | ICD-10-CM

## 2023-08-03 NOTE — Progress Notes (Signed)
 Office Note   History of Present Illness   Jessica Hart is a 83 y.o. ([birth year]) female who presents for initial evaluation of carotid artery stenosis.  She has a past medical history of DMII, heart failure, hyperlipidemia, hypertension, and pulmonary hypertension on 3 to 4 L supplemental oxygen  at baseline.  She was recently evaluated by an ophthalmologist in February 2025 with the following diagnoses: Moderate nonproliferative diabetic neuropathy, hypertensive retinopathy, and branch retinal artery occlusion.  They requested MRA of the head and neck to rule out prior history of stroke.  She was referred to our clinic based off her MRA results.  At today's visit she is doing well. She denies any known history of CVA or TIA. She denies any previous symptoms of stroke such as dizziness, slurred speech, sudden weakness/numbness, lip droop, or sudden visual changes.   She has a 45 pack/year smoking history. She quit in 2007.   Current Outpatient Medications  Medication Sig Dispense Refill   acetaminophen  (TYLENOL ) 500 MG tablet Take 2 tablets (1,000 mg total) by mouth every 8 (eight) hours as needed. 30 tablet 2   aspirin  EC 81 MG tablet Take 1 tablet (81 mg total) by mouth daily. 90 tablet 3   Blood Glucose Monitoring Suppl DEVI Test 1-2 times day. Pt needs onetouch meter 1 each 0   Cholecalciferol (D3 5000) 125 MCG (5000 UT) capsule Take 5,000 Units by mouth daily.     clobetasol  ointment (TEMOVATE ) 0.05 % Apply thin layer daily to affected area for 12 weeks. 60 g 5   CREON  36000-114000 units CPEP capsule TAKE 2 CAPSULES BY MOUTH 3 TIMES A DAY WITH MEALS AND TAKE 1 CAPSULE WITH EACH SNACK TWICE DAILY 300 capsule 9   dapagliflozin  propanediol (FARXIGA ) 5 MG TABS tablet Take 1 tablet (5 mg total) by mouth daily. 90 tablet 1   estradiol  (ESTRACE ) 0.1 MG/GM vaginal cream Pea size amount per vagina 1-2 times per week 42.5 g 2   furosemide  (LASIX ) 20 MG tablet Take 3 tablets (60 mg  total) by mouth daily. 270 tablet 2   Glucose Blood (BLOOD GLUCOSE TEST STRIPS) STRP Test 1-2 times daily. Needs onetouch strips 100 strip 1   insulin  degludec (TRESIBA  FLEXTOUCH) 100 UNIT/ML FlexTouch Pen Inject 15 Units into the skin daily after supper. 30 day supply voucher do not bill insurance BIN [phone] PCN [employer] [id number] ID [id number] 15 mL 0   Insulin  Pen Needle (PEN NEEDLES) 33G X 4 MM MISC Use for  insulin  medication 100 each 1   Lancet Device MISC Use device to test bloodsugar with lancet 1 each 0   Lancets Misc. MISC 1-2 times a day. Needs one touch lancets 100 each 1   losartan  (COZAAR ) 50 MG tablet Take 1 tablet (50 mg total) by mouth daily. 90 tablet 1   macitentan  (OPSUMIT ) 10 MG tablet TAKE 1 TABLET BY MOUTH 1 TIME A DAY. DO NOT HANDLE IF PREGNANT. DO NOT SPLIT, CRUSH, OR CHEW. REVIEW MEDICATION GUIDE 30 tablet 10   metFORMIN  (GLUCOPHAGE -XR) 750 MG 24 hr tablet TAKE 1 TABLET BY MOUTH EVERY DAY WITH BREAKFAST 90 tablet 0   metoprolol  tartrate (LOPRESSOR ) 25 MG tablet Take 1 tablet (25 mg total) by mouth 2 (two) times daily. Take 25 mg by mouth 2 (two) times daily. 180 tablet 1   Misc. Devices MISC SIG: 1 L of Oxygen  via Nasal cannula around the clock Dispense Home Oxygen  and Portable tank and necessary supplies. Dx: Hypoxemia, Shortness  of breath (Patient taking differently: Place 4-4.5 L into the nose See admin instructions. 4 - 4.5 L of Oxygen  via Nasal cannula around the clock Dispense Home Oxygen  and Portable tank and necessary supplies. Dx: Hypoxemia, Shortness of breath) 1 Device 0   Multiple Vitamins-Minerals (NO IRON MULT VITAMIN-MINERALS) TABS TAKE 1 TABLET BY MOUTH DAILY (Patient taking differently: Take 1 tablet by mouth daily.) 30 tablet 0   omeprazole  (PRILOSEC) 40 MG capsule TAKE 1 CAPSULE (40 MG TOTAL) BY MOUTH DAILY. 90 capsule 0   ONETOUCH VERIO test strip SMARTSIG:1 Each Via Meter Daily     potassium chloride  (KLOR-CON ) 10 MEQ tablet TAKE 2 TABLETS BY MOUTH  DAILY 180 tablet 1   rosuvastatin  (CRESTOR ) 20 MG tablet TAKE 1 TABLET BY MOUTH EVERY DAY 90 tablet 1   tadalafil , PAH, (ADCIRCA ) 20 MG tablet TAKE 2 TABLETS BY MOUTH 1 TIME A DAY 60 tablet 10   TRELEGY ELLIPTA  100-62.5-25 MCG/ACT AEPB INHALE 1 PUFF INTO THE LUNGS DAILY 180 each 3   vitamin B-12 (CYANOCOBALAMIN) 500 MCG tablet Take 500 mcg by mouth daily.     No current facility-administered medications for this visit.    REVIEW OF SYSTEMS (negative unless checked):   Cardiac:  []  Chest pain or chest pressure? [x]  Shortness of breath upon activity? []  Shortness of breath when lying flat? []  Irregular heart rhythm?  Vascular:  []  Pain in calf, thigh, or hip brought on by walking? []  Pain in feet at night that wakes you up from your sleep? []  Blood clot in your veins? []  Leg swelling?  Pulmonary:  [x]  Oxygen  at home? []  Productive cough? []  Wheezing?  Neurologic:  []  Sudden weakness in arms or legs? []  Sudden numbness in arms or legs? []  Sudden onset of difficult speaking or slurred speech? []  Temporary loss of vision in one eye? []  Problems with dizziness?  Gastrointestinal:  []  Blood in stool? []  Vomited blood?  Genitourinary:  []  Burning when urinating? []  Blood in urine?  Psychiatric:  []  Major depression  Hematologic:  []  Bleeding problems? []  Problems with blood clotting?  Dermatologic:  []  Rashes or ulcers?  Constitutional:  []  Fever or chills?  Ear/Nose/Throat:  []  Change in hearing? []  Nose bleeds? []  Sore throat?  Musculoskeletal:  []  Back pain? []  Joint pain? []  Muscle pain?   Physical Examination    Vitals:   08/01/23 1058 08/01/23 1101  BP: (!) 141/72 139/73  Pulse: 93   Temp: 98.2 F (36.8 C)   SpO2: 97%   Weight: 158 lb (71.7 kg)   Height: 5' 3.5" (1.613 m)    Body mass index is 27.55 kg/m.  General:  WDWN in NAD; vital signs documented above Gait: Not observed HENT: WNL, normocephalic Pulmonary: normal non-labored  breathing, on 4L [place] Cardiac: regular, without carotid bruits Abdomen: soft, NT, no masses Skin: without rashes Vascular Exam/Pulses: palpable radial pulses bilaterally Extremities: without ischemic changes, without gangrene , without cellulitis; without open wounds;  Musculoskeletal: no muscle wasting or atrophy  Neurologic: A&O X 3;  No focal weakness or paresthesias are detected Psychiatric:  The pt has Normal affect.  Non-Invasive Vascular Imaging   MRA Head/Neck (06/10/2023): Anterior circulation: Atheromatous type irregularity to the cavernous carotids without flow reducing stenosis or asymmetry. Bilateral medium size vessel atheromatous irregularity with ~50% narrowing at the right M1 and lesser narrowing at the left M1 on 3D reformats. No major branch occlusion, segmental narrowing, or aneurysm.   Posterior circulation: Left dominant vertebral artery. High-grade  narrowing of the right vertebral artery beyond the PICA. The basilar is smoothly contoured and widely patent. No branch occlusion, beading, or proximal flow reducing stenosis.  Right carotid system: Mild wasting at the proximal ICA measuring 25% and attributed to mild atherosclerosis. No ulceration or beading.   Left carotid system: Diffusely patent and smoothly contoured when allowing for motion artifact.   Vertebral arteries: Unavoidable motion artifact especially affecting the vertebral origins. The vertebral arteries are tortuous but smoothly contoured and diffusely patent. No significant proximal subclavian stenosis detected.    Medical Decision Making   RHYLIN VENTERS is a 83 y.o. female who presents for evaluation of carotid stenosis  The patient presents to our office today for evaluation of carotid stenosis. This was requested after recent MRA head/neck imaging  MRA of the neck in April demonstrates a patent left carotid artery without stenosis. There is about 25% stenosis of the proximal right  ICA, which is not flow limiting On exam the patient is on 4L O2 via [place] at baseline. She has no carotid bruits on exam. She is neurologically intact. She has palpable and equal radial pulses She does have a prior smoking history and quit in 2007. She denies any prior history of CVA or TIA. She denies any prior symptoms of stroke such as sudden weakness/numbness, slurred speech, sudden visual changes, lip droop, or dizziness.  Given that she has no significant carotid stenosis >80% and is asymptomatic, she does not require any surgical intervention. We recommend continued medical management with ASA 81mg  daily and a statin.  She is aware that indications for carotid disease includes asymptomatic >80% stenosis or symptomatic >50% stenosis. At this time we recommend regular surveillance. She can follow up with our office in 1 year with carotid duplex  Deneise Finlay PA-C [facility] Office: [phone]  Clinic MD: Vikki Graves

## 2023-08-07 DIAGNOSIS — I270 Primary pulmonary hypertension: Secondary | ICD-10-CM | POA: Diagnosis not present

## 2023-08-07 DIAGNOSIS — J9611 Chronic respiratory failure with hypoxia: Secondary | ICD-10-CM | POA: Diagnosis not present

## 2023-08-13 ENCOUNTER — Other Ambulatory Visit: Payer: Self-pay | Admitting: Medical

## 2023-08-13 DIAGNOSIS — I10 Essential (primary) hypertension: Secondary | ICD-10-CM

## 2023-08-22 NOTE — Telephone Encounter (Signed)
 PAP Tresiba  received, called pt & informed

## 2023-08-30 ENCOUNTER — Ambulatory Visit: Admitting: Podiatry

## 2023-08-30 ENCOUNTER — Encounter: Payer: Self-pay | Admitting: Podiatry

## 2023-08-30 DIAGNOSIS — L84 Corns and callosities: Secondary | ICD-10-CM

## 2023-08-30 DIAGNOSIS — E1142 Type 2 diabetes mellitus with diabetic polyneuropathy: Secondary | ICD-10-CM | POA: Diagnosis not present

## 2023-08-30 DIAGNOSIS — M79674 Pain in right toe(s): Secondary | ICD-10-CM | POA: Diagnosis not present

## 2023-08-30 DIAGNOSIS — B351 Tinea unguium: Secondary | ICD-10-CM

## 2023-08-30 DIAGNOSIS — M79675 Pain in left toe(s): Secondary | ICD-10-CM | POA: Diagnosis not present

## 2023-08-30 NOTE — Progress Notes (Signed)
  Subjective:  Patient ID: Othel LELON Gunner, female    DOB: 02-10-1941,  MRN: [id number]  Chief Complaint  Patient presents with   Diabetes    [facility] Toenail trim IDDM A1C 5.7.    83 y.o. female presents with the above complaint. History confirmed with patient. Patient presenting with pain related to dystrophic thickened elongated nails. Patient is unable to trim own nails related to nail dystrophy and/or mobility issues. Patient does have a history of T2DM. Patient does have callus present located at the left plantar foot sub 2nd and 5th met head causing pain.  Patient does have some level of numbness in the feet.  She does take insulin .  She is on home O2.  Last A1c 5.7  Objective:  Physical Exam: warm to cool, good capillary refill, pedal skin atrophic, decreased pedal hair growth nail exam onychomycosis of the toenails, onycholysis, and dystrophic nails protective sensation absent DP pulses are very weakly palpable nonpalpable PT pulses cap refill intact all toes. Left Foot:  Pain with palpation of nails due to elongation and dystrophic growth.  Painful calluses present left subsecond, left subfifth metatarsal heads. Right Foot: Pain with palpation of nails due to elongation and dystrophic growth.   Assessment:   1. DM type 2 with diabetic peripheral neuropathy (HCC)   2. Pain due to onychomycosis of toenails of both feet   3. Pre-ulcerative calluses        Plan:  Patient was evaluated and treated and all questions answered.  #Hyperkeratotic lesions/pre ulcerative calluses present subsecond metatarsal head and subfifth metatarsal head on the left foot All symptomatic hyperkeratoses x 2 separate lesions were safely debrided with a sterile #312 blade to patient's level of comfort without incident. We discussed preventative and palliative care of these lesions including supportive and accommodative shoegear, padding, prefabricated and custom molded accommodative orthoses, use of a  pumice stone and lotions/creams daily. - Did discuss use of topical urea cream or use of white vinegar scrub down to the calluses to help keep them under control in between visits.  #Onychomycosis with pain  -Nails palliatively debrided as below. -Educated on self-care  Procedure: Nail Debridement Rationale: Pain Type of Debridement: manual, sharp debridement. Instrumentation: Nail nipper, rotary burr. Number of Nails: 10  Patient educated on diabetes. Discussed proper diabetic foot care and discussed risks and complications of disease. Educated patient in depth on reasons to return to the office immediately should he/she discover anything concerning or new on the feet. All questions answered. Discussed proper shoes as well.    Return in about 3 months (around 11/30/2023) for Diabetic Foot Care.         Ethan Saddler, DPM [facility]

## 2023-08-30 NOTE — Patient Instructions (Signed)
 Look for urea 40% cream or ointment and apply to the thickened dry skin / calluses. This can be bought over the counter, at a pharmacy or online such as [employer].  You can also scrub the calluses with white vinegar and this will make it easier for you to keep file down with an emery board or pumice stone.

## 2023-09-06 DIAGNOSIS — J9611 Chronic respiratory failure with hypoxia: Secondary | ICD-10-CM | POA: Diagnosis not present

## 2023-09-06 DIAGNOSIS — I270 Primary pulmonary hypertension: Secondary | ICD-10-CM | POA: Diagnosis not present

## 2023-09-07 ENCOUNTER — Other Ambulatory Visit: Payer: Self-pay | Admitting: Medical

## 2023-09-07 DIAGNOSIS — K219 Gastro-esophageal reflux disease without esophagitis: Secondary | ICD-10-CM

## 2023-09-10 ENCOUNTER — Encounter: Payer: Self-pay | Admitting: Obstetrics and Gynecology

## 2023-09-10 ENCOUNTER — Ambulatory Visit (INDEPENDENT_AMBULATORY_CARE_PROVIDER_SITE_OTHER): Admitting: Obstetrics and Gynecology

## 2023-09-10 VITALS — BP 156/82 | HR 97 | Temp 98.0°F | Wt 154.0 lb

## 2023-09-10 DIAGNOSIS — N952 Postmenopausal atrophic vaginitis: Secondary | ICD-10-CM | POA: Diagnosis not present

## 2023-09-10 DIAGNOSIS — L280 Lichen simplex chronicus: Secondary | ICD-10-CM

## 2023-09-10 MED ORDER — ESTRADIOL 0.1 MG/GM VA CREA: TOPICAL_CREAM | VAGINAL | 1 refills | Status: AC

## 2023-09-10 MED ORDER — CLOBETASOL PROPIONATE 0.05 % EX OINT: TOPICAL_OINTMENT | CUTANEOUS | 5 refills | Status: DC

## 2023-09-10 NOTE — Progress Notes (Signed)
 83 y.o. G1P1 female with lichen simplex chronicus (bx proven 01/2019), s/p TAH here for 77-month follow-up.  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     Lost to follow-up since. Reestablished 06/11/23, reporting: a lot of irritations, has been scratching. Sometimes she scratches so hard that she bleeds at times.   She was restarted on clobetasol  at that time. Today, she reports she is staying lubricated and thinks the medications have helped. She is only using clobetasol  3x/wk. She has not started estrace .  OB History  Gravida Para Term Preterm AB Living  1 1    1   SAB IAB Ectopic Multiple Live Births          # Outcome Date GA Lbr Len/2nd Weight Sex Type Anes PTL Lv  1 Para             Past Medical History:  Diagnosis Date   Arthritis    Cataract bilaterl 3 years ago   Coronary artery disease    Diabetes mellitus 02/27/2005   Diabetes mellitus type 2, controlled, without complications (HCC) 04/26/2006   Qualifier: Diagnosis of  By: Damien Folks     Diverticulosis    Dyspnea    Echocardiogram findings abnormal, without diagnosis 2005   EF 47%, no ischemia, no infarction   Former smoker 01/10/2017   45 pack year history, quit in 2007    GERD (gastroesophageal reflux disease)    Heart murmur    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   History of bone density study 2006   Lt hip = -1.0, L spine = -1.8    Hyperlipidemia    Hypertension     Past Surgical History:  Procedure Laterality Date   ABDOMINAL HYSTERECTOMY  1989   one ovary remains per patient   CARDIAC CATHETERIZATION  08/20/2019   COLONOSCOPY     CORONARY ARTERY BYPASS GRAFT N/A 09/02/2019   Procedure: CORONARY ARTERY BYPASS GRAFTING (CABG) TIMES THREE USING LEFT INTERNAL MAMMARY ARTERY AND RIGHT GREATER SAPHENOUS VEIN;  Surgeon: Shyrl Linnie KIDD, MD;  Location: [facility] OR;  Service: Open Heart Surgery;  Laterality: N/A;   ENDOVEIN  HARVEST OF GREATER SAPHENOUS VEIN Right 09/02/2019   Procedure: ENDOVEIN HARVEST OF GREATER SAPHENOUS VEIN;  Surgeon: Shyrl Linnie KIDD, MD;  Location: [facility] OR;  Service: Open Heart Surgery;  Laterality: Right;   EYE SURGERY Bilateral    CATARACT   LEFT HEART CATH AND CORONARY ANGIOGRAPHY N/A 08/20/2019   Procedure: LEFT HEART CATH AND CORONARY ANGIOGRAPHY;  Surgeon: Claudene Victory ORN, MD;  Location: [facility];  Service: Cardiovascular;  Laterality: N/A;   RIGHT AND LEFT HEART CATH N/A 07/13/2021   Procedure: RIGHT AND LEFT HEART CATH;  Surgeon: Wendel Lurena POUR, MD;  Location: [facility];  Service: Cardiovascular;  Laterality: N/A;   TEE WITHOUT CARDIOVERSION N/A 09/02/2019   Procedure: TRANSESOPHAGEAL ECHOCARDIOGRAM (TEE);  Surgeon: Shyrl Linnie KIDD, MD;  Location: [facility] OR;  Service: Open Heart Surgery;  Laterality: N/A;   TONSILECTOMY, ADENOIDECTOMY, BILATERAL MYRINGOTOMY AND TUBES  1965   TONSILLECTOMY      Current Outpatient Medications on File Prior to Visit  Medication Sig Dispense Refill   acetaminophen  (TYLENOL ) 500 MG tablet Take 2 tablets (1,000 mg total) by mouth every 8 (eight) hours as needed. 30 tablet 2   aspirin  EC 81 MG tablet Take 1 tablet (81 mg total) by mouth daily. 90 tablet 3   Blood Glucose  Monitoring Suppl DEVI Test 1-2 times day. Pt needs onetouch meter 1 each 0   Cholecalciferol (D3 5000) 125 MCG (5000 UT) capsule Take 5,000 Units by mouth daily.     CREON  36000-114000 units CPEP capsule TAKE 2 CAPSULES BY MOUTH 3 TIMES A DAY WITH MEALS AND TAKE 1 CAPSULE WITH EACH SNACK TWICE DAILY 300 capsule 9   dapagliflozin  propanediol (FARXIGA ) 5 MG TABS tablet Take 1 tablet (5 mg total) by mouth daily. 90 tablet 1   furosemide  (LASIX ) 20 MG tablet Take 3 tablets (60 mg total) by mouth daily. 270 tablet 2   Glucose Blood (BLOOD GLUCOSE TEST STRIPS) STRP Test 1-2 times daily. Needs onetouch strips 100 strip 1   insulin  degludec (TRESIBA  FLEXTOUCH) 100 UNIT/ML  FlexTouch Pen Inject 15 Units into the skin daily after supper. 30 day supply voucher do not bill insurance BIN [phone] PCN [employer] [id number] ID [id number] 15 mL 0   Insulin  Pen Needle (PEN NEEDLES) 33G X 4 MM MISC Use for  insulin  medication 100 each 1   Lancet Device MISC Use device to test bloodsugar with lancet 1 each 0   Lancets Misc. MISC 1-2 times a day. Needs one touch lancets 100 each 1   losartan  (COZAAR ) 50 MG tablet TAKE 1 TABLET BY MOUTH EVERY DAY 90 tablet 0   macitentan  (OPSUMIT ) 10 MG tablet TAKE 1 TABLET BY MOUTH 1 TIME A DAY. DO NOT HANDLE IF PREGNANT. DO NOT SPLIT, CRUSH, OR CHEW. REVIEW MEDICATION GUIDE 30 tablet 10   metFORMIN  (GLUCOPHAGE -XR) 750 MG 24 hr tablet TAKE 1 TABLET BY MOUTH EVERY DAY WITH BREAKFAST 90 tablet 0   metoprolol  tartrate (LOPRESSOR ) 25 MG tablet Take 1 tablet (25 mg total) by mouth 2 (two) times daily. Take 25 mg by mouth 2 (two) times daily. 180 tablet 1   Misc. Devices MISC SIG: 1 L of Oxygen  via Nasal cannula around the clock Dispense Home Oxygen  and Portable tank and necessary supplies. Dx: Hypoxemia, Shortness of breath 1 Device 0   Multiple Vitamins-Minerals (NO IRON MULT VITAMIN-MINERALS) TABS TAKE 1 TABLET BY MOUTH DAILY 30 tablet 0   omeprazole  (PRILOSEC) 40 MG capsule TAKE 1 CAPSULE (40 MG TOTAL) BY MOUTH DAILY. 90 capsule 1   ONETOUCH VERIO test strip SMARTSIG:1 Each Via Meter Daily     potassium chloride  (KLOR-CON ) 10 MEQ tablet TAKE 2 TABLETS BY MOUTH DAILY 180 tablet 1   rosuvastatin  (CRESTOR ) 20 MG tablet TAKE 1 TABLET BY MOUTH EVERY DAY 90 tablet 1   tadalafil , PAH, (ADCIRCA ) 20 MG tablet TAKE 2 TABLETS BY MOUTH 1 TIME A DAY 60 tablet 10   TRELEGY ELLIPTA  100-62.5-25 MCG/ACT AEPB INHALE 1 PUFF INTO THE LUNGS DAILY 180 each 3   vitamin B-12 (CYANOCOBALAMIN) 500 MCG tablet Take 500 mcg by mouth daily.     No current facility-administered medications on file prior to visit.    Allergies  Allergen Reactions   Lisinopril Itching and  Cough      PE Today's Vitals   09/10/23 1029  BP: (!) 156/82  Pulse: 97  Temp: 98 F (36.7 C)  TempSrc: Oral  SpO2: 93%  Weight: 154 lb (69.9 kg)    Body mass index is 26.85 kg/m.  Physical Exam Vitals reviewed. Exam conducted with a chaperone present.  Constitutional:      General: She is not in acute distress.    Appearance: Normal appearance.  HENT:     Head: Normocephalic and atraumatic.  Nose: Nose normal.  Eyes:     Extraocular Movements: Extraocular movements intact.     Conjunctiva/sclera: Conjunctivae normal.  Pulmonary:     Effort: Pulmonary effort is normal.  Genitourinary:    General: Normal vulva.     Exam position: Lithotomy position.     Vagina: Normal. No vaginal discharge.     Uterus: Absent.      Adnexa: Right adnexa normal and left adnexa normal.      Comments: Bilateral erythema and hypopigmentation of labia minor, expanding onto labia major, R>L and involving perineum Lichenification and whites patches resolved Cervix and uterus absent Musculoskeletal:        General: Normal range of motion.     Cervical back: Normal range of motion.  Neurological:     General: No focal deficit present.     Mental Status: She is alert.  Psychiatric:        Mood and Affect: Mood normal.        Behavior: Behavior normal.       Assessment and Plan:        Lichen simplex chronicus -     Clobetasol  Propionate; Apply thin layer daily to affected area for 12 weeks.  Dispense: 60 g; Refill: 5 Reviewed importance of daily clobetasol  use.  Education provided.  Discussed risk of vulvar cancer in setting of chronic inflammation. Recommend applying 0.5 fingertip unit to vulva and 0.5 fingertip unit to perineum and perianal area for another 6 weeks. Return office for reassessment at that time. If unimproved consider alternative therapy  Vaginal atrophy -     Estradiol ; Apply a pea size (0.5g) amount to the vagina 2 times per week. Do not use applicator.   Dispense: 42.5 g; Refill: 1 Recommend restarting Estrace . Apply aquaphor, vaseline or coconut oil to the vulva after showers.   Jessica LULLA Pa, MD

## 2023-09-13 ENCOUNTER — Other Ambulatory Visit: Payer: Self-pay | Admitting: Medical

## 2023-09-22 ENCOUNTER — Other Ambulatory Visit: Payer: Self-pay | Admitting: Medical

## 2023-10-07 DIAGNOSIS — I270 Primary pulmonary hypertension: Secondary | ICD-10-CM | POA: Diagnosis not present

## 2023-10-07 DIAGNOSIS — J9611 Chronic respiratory failure with hypoxia: Secondary | ICD-10-CM | POA: Diagnosis not present

## 2023-10-09 DIAGNOSIS — H34232 Retinal artery branch occlusion, left eye: Secondary | ICD-10-CM | POA: Diagnosis not present

## 2023-10-09 DIAGNOSIS — H524 Presbyopia: Secondary | ICD-10-CM | POA: Diagnosis not present

## 2023-10-09 DIAGNOSIS — E113393 Type 2 diabetes mellitus with moderate nonproliferative diabetic retinopathy without macular edema, bilateral: Secondary | ICD-10-CM | POA: Diagnosis not present

## 2023-10-09 DIAGNOSIS — H35372 Puckering of macula, left eye: Secondary | ICD-10-CM | POA: Diagnosis not present

## 2023-10-22 ENCOUNTER — Ambulatory Visit: Admitting: Obstetrics and Gynecology

## 2023-10-22 ENCOUNTER — Encounter: Payer: Self-pay | Admitting: Obstetrics and Gynecology

## 2023-10-22 VITALS — BP 132/70 | HR 80 | Temp 97.7°F | Wt 149.0 lb

## 2023-10-22 DIAGNOSIS — N766 Ulceration of vulva: Secondary | ICD-10-CM

## 2023-10-22 DIAGNOSIS — L280 Lichen simplex chronicus: Secondary | ICD-10-CM | POA: Insufficient documentation

## 2023-10-22 DIAGNOSIS — N952 Postmenopausal atrophic vaginitis: Secondary | ICD-10-CM

## 2023-10-22 NOTE — Progress Notes (Signed)
 83 y.o. G1P1 female with lichen simplex chronicus (bx proven 01/2019), [facility], s/p TAH here for 54-month follow-up.  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     Lost to follow-up since. Reestablished 06/11/23, reporting: a lot of irritations, has been scratching. Sometimes she scratches so hard that she bleeds at times.   She was restarted on clobetasol  at that time. Today, she reports she is staying lubricated and thinks the medications have helped. She is using clobetasol  and estrace  daily.   She reports some rectal bleeding this morning with BM. Concerned with bumps around rectum area. Has ~3 soft BM a day. No constipation or straining.   OB History  Gravida Para Term Preterm AB Living  1 1    1   SAB IAB Ectopic Multiple Live Births          # Outcome Date GA Lbr Len/2nd Weight Sex Type Anes PTL Lv  1 Para             Past Medical History:  Diagnosis Date   Arthritis    Cataract bilaterl 3 years ago   Coronary artery disease    Diabetes mellitus 02/27/2005   Diabetes mellitus type 2, controlled, without complications (HCC) 04/26/2006   Qualifier: Diagnosis of  By: Damien Folks     Diverticulosis    Dyspnea    Echocardiogram findings abnormal, without diagnosis 2005   EF 47%, no ischemia, no infarction   Former smoker 01/10/2017   45 pack year history, quit in 2007    GERD (gastroesophageal reflux disease)    Heart murmur    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   History of bone density study 2006   Lt hip = -1.0, L spine = -1.8    Hyperlipidemia    Hypertension     Past Surgical History:  Procedure Laterality Date   ABDOMINAL HYSTERECTOMY  1989   one ovary remains per patient   CARDIAC CATHETERIZATION  08/20/2019   COLONOSCOPY     CORONARY ARTERY BYPASS GRAFT N/A 09/02/2019   Procedure: CORONARY ARTERY BYPASS GRAFTING (CABG) TIMES THREE USING LEFT INTERNAL MAMMARY ARTERY AND RIGHT  GREATER SAPHENOUS VEIN;  Surgeon: Shyrl Linnie KIDD, MD;  Location: [facility] OR;  Service: Open Heart Surgery;  Laterality: N/A;   ENDOVEIN HARVEST OF GREATER SAPHENOUS VEIN Right 09/02/2019   Procedure: ENDOVEIN HARVEST OF GREATER SAPHENOUS VEIN;  Surgeon: Shyrl Linnie KIDD, MD;  Location: [facility] OR;  Service: Open Heart Surgery;  Laterality: Right;   EYE SURGERY Bilateral    CATARACT   LEFT HEART CATH AND CORONARY ANGIOGRAPHY N/A 08/20/2019   Procedure: LEFT HEART CATH AND CORONARY ANGIOGRAPHY;  Surgeon: Claudene Victory ORN, MD;  Location: [facility];  Service: Cardiovascular;  Laterality: N/A;   RIGHT AND LEFT HEART CATH N/A 07/13/2021   Procedure: RIGHT AND LEFT HEART CATH;  Surgeon: Wendel Lurena POUR, MD;  Location: [facility];  Service: Cardiovascular;  Laterality: N/A;   TEE WITHOUT CARDIOVERSION N/A 09/02/2019   Procedure: TRANSESOPHAGEAL ECHOCARDIOGRAM (TEE);  Surgeon: Shyrl Linnie KIDD, MD;  [facility];  Service: Open Heart Surgery;  Laterality: N/A;   TONSILECTOMY, ADENOIDECTOMY, BILATERAL MYRINGOTOMY AND TUBES  1965   TONSILLECTOMY      Current Outpatient Medications on File Prior to Visit  Medication Sig Dispense Refill   acetaminophen  (TYLENOL ) 500 MG tablet Take 2 tablets (1,000 mg total) by mouth every 8 (eight) hours as needed. 30  tablet 2   aspirin  EC 81 MG tablet Take 1 tablet (81 mg total) by mouth daily. 90 tablet 3   Blood Glucose Monitoring Suppl DEVI Test 1-2 times day. Pt needs onetouch meter 1 each 0   Cholecalciferol (D3 5000) 125 MCG (5000 UT) capsule Take 5,000 Units by mouth daily.     clobetasol  ointment (TEMOVATE ) 0.05 % Apply thin layer daily to affected area for 12 weeks. 60 g 5   CREON  36000-114000 units CPEP capsule TAKE 2 CAPSULES BY MOUTH 3 TIMES A DAY WITH MEALS AND TAKE 1 CAPSULE WITH EACH SNACK TWICE DAILY 300 capsule 9   dapagliflozin  propanediol (FARXIGA ) 5 MG TABS tablet Take 1 tablet (5 mg total) by mouth daily. 90 tablet 1   estradiol   (ESTRACE ) 0.1 MG/GM vaginal cream Apply a pea size (0.5g) amount to the vagina 2 times per week. Do not use applicator. 42.5 g 1   furosemide  (LASIX ) 20 MG tablet Take 3 tablets (60 mg total) by mouth daily. 270 tablet 2   Glucose Blood (BLOOD GLUCOSE TEST STRIPS) STRP Test 1-2 times daily. Needs onetouch strips 100 strip 1   insulin  degludec (TRESIBA  FLEXTOUCH) 100 UNIT/ML FlexTouch Pen Inject 15 Units into the skin daily after supper. 30 day supply voucher do not bill insurance BIN [phone] PCN [employer] [id number] ID [id number] 15 mL 0   Insulin  Pen Needle (PEN NEEDLES) 33G X 4 MM MISC Use for  insulin  medication 100 each 1   Lancet Device MISC Use device to test bloodsugar with lancet 1 each 0   Lancets Misc. MISC 1-2 times a day. Needs one touch lancets 100 each 1   losartan  (COZAAR ) 50 MG tablet TAKE 1 TABLET BY MOUTH EVERY DAY 90 tablet 0   macitentan  (OPSUMIT ) 10 MG tablet TAKE 1 TABLET BY MOUTH 1 TIME A DAY. DO NOT HANDLE IF PREGNANT. DO NOT SPLIT, CRUSH, OR CHEW. REVIEW MEDICATION GUIDE 30 tablet 10   metFORMIN  (GLUCOPHAGE -XR) 750 MG 24 hr tablet TAKE 1 TABLET BY MOUTH EVERY DAY WITH BREAKFAST 90 tablet 1   metoprolol  tartrate (LOPRESSOR ) 25 MG tablet TAKE 1 TABLET (25 MG TOTAL) BY MOUTH 2 (TWO) TIMES DAILY. TAKE 25 MG BY MOUTH 2 (TWO) TIMES DAILY. 180 tablet 1   Misc. Devices MISC SIG: 1 L of Oxygen  via Nasal cannula around the clock Dispense Home Oxygen  and Portable tank and necessary supplies. Dx: Hypoxemia, Shortness of breath 1 Device 0   Multiple Vitamins-Minerals (NO IRON MULT VITAMIN-MINERALS) TABS TAKE 1 TABLET BY MOUTH DAILY 30 tablet 0   omeprazole  (PRILOSEC) 40 MG capsule TAKE 1 CAPSULE (40 MG TOTAL) BY MOUTH DAILY. 90 capsule 1   ONETOUCH VERIO test strip SMARTSIG:1 Each Via Meter Daily     potassium chloride  (KLOR-CON ) 10 MEQ tablet TAKE 2 TABLETS BY MOUTH DAILY 180 tablet 1   rosuvastatin  (CRESTOR ) 20 MG tablet TAKE 1 TABLET BY MOUTH EVERY DAY 90 tablet 1   tadalafil , PAH,  (ADCIRCA ) 20 MG tablet TAKE 2 TABLETS BY MOUTH 1 TIME A DAY 60 tablet 10   TRELEGY ELLIPTA  100-62.5-25 MCG/ACT AEPB INHALE 1 PUFF INTO THE LUNGS DAILY 180 each 3   vitamin B-12 (CYANOCOBALAMIN) 500 MCG tablet Take 500 mcg by mouth daily.     No current facility-administered medications on file prior to visit.    Allergies  Allergen Reactions   Lisinopril Itching and Cough      PE Today's Vitals   10/22/23 1157  BP: 132/70  Pulse: 80  Temp: 97.7 F (36.5 C)  TempSrc: Oral  SpO2: 92%  Weight: 149 lb (67.6 kg)     Body mass index is 25.98 kg/m.  Physical Exam Vitals reviewed. Exam conducted with a chaperone present.  Constitutional:      General: She is not in acute distress.    Appearance: Normal appearance.  HENT:     Head: Normocephalic and atraumatic.     Nose: Nose normal.  Eyes:     Extraocular Movements: Extraocular movements intact.     Conjunctiva/sclera: Conjunctivae normal.  Pulmonary:     Effort: Pulmonary effort is normal.  Genitourinary:    General: Normal vulva.     Exam position: Lithotomy position.     Vagina: Normal. No vaginal discharge.     Uterus: Absent.      Adnexa: Right adnexa normal and left adnexa normal.     Rectum: External hemorrhoid present.      Comments: Bilateral erythema and hypopigmentation of labia minor, expanding onto labia major, R>L and involving perineum Lichenification and whites patches resolved Cervix and uterus absent Musculoskeletal:        General: Normal range of motion.     Cervical back: Normal range of motion.  Neurological:     General: No focal deficit present.     Mental Status: She is alert.  Psychiatric:        Mood and Affect: Mood normal.        Behavior: Behavior normal.      Assessment and Plan:        Lichen simplex chronicus Assessment & Plan: Initial biopsy 2020, lost to follow-up Minimal improvement over 4 months of topical steroids Recommend vulvar biopsy given minimal  improvement Continue applying 0.5 fingertip unit to vulva and 0.5 fingertip unit to perineum and perianal area. After biopsy, plan to switch to topical tacrolimus 0.1% ointment sparingly to the affected area twice daily for three months. (If stings, mix 1:1 with clobetasol .)  Orders: -     Biopsy vulva; Future  Vaginal atrophy Assessment & Plan: Continue estrace    Vulvar ulceration -     SureSwab HSV, Type 1/2 DNA, PCR   Jessica LULLA Pa, MD

## 2023-10-22 NOTE — Assessment & Plan Note (Addendum)
 SABRA

## 2023-10-22 NOTE — Assessment & Plan Note (Signed)
 Continue estrace

## 2023-10-22 NOTE — Assessment & Plan Note (Signed)
 Initial biopsy 2020, lost to follow-up Minimal improvement over 4 months of topical steroids Recommend vulvar biopsy given minimal improvement Continue applying 0.5 fingertip unit to vulva and 0.5 fingertip unit to perineum and perianal area. After biopsy, plan to switch to topical tacrolimus 0.1% ointment sparingly to the affected area twice daily for three months. (If stings, mix 1:1 with clobetasol .)

## 2023-10-24 LAB — SURESWAB HSV, TYPE 1/2 DNA, PCR
HSV 1 DNA: NOT DETECTED
HSV 2 DNA: DETECTED — AB

## 2023-10-25 ENCOUNTER — Ambulatory Visit: Payer: Self-pay | Admitting: Obstetrics and Gynecology

## 2023-10-25 DIAGNOSIS — A6004 Herpesviral vulvovaginitis: Secondary | ICD-10-CM | POA: Insufficient documentation

## 2023-10-25 MED ORDER — VALACYCLOVIR HCL 500 MG PO TABS: 500.0000 mg | ORAL_TABLET | Freq: Two times a day (BID) | ORAL | 3 refills | Status: AC

## 2023-11-05 ENCOUNTER — Other Ambulatory Visit (HOSPITAL_COMMUNITY)
Admission: RE | Admit: 2023-11-05 | Discharge: 2023-11-05 | Disposition: A | Discharge status: Home / Self Care | Source: Ambulatory Visit | Attending: Obstetrics and Gynecology | Admitting: Obstetrics and Gynecology

## 2023-11-05 ENCOUNTER — Ambulatory Visit (INDEPENDENT_AMBULATORY_CARE_PROVIDER_SITE_OTHER): Admitting: Obstetrics and Gynecology

## 2023-11-05 ENCOUNTER — Encounter: Payer: Self-pay | Admitting: Obstetrics and Gynecology

## 2023-11-05 ENCOUNTER — Telehealth: Payer: Self-pay | Admitting: Internal Medicine

## 2023-11-05 ENCOUNTER — Other Ambulatory Visit: Payer: Self-pay | Admitting: Obstetrics and Gynecology

## 2023-11-05 VITALS — BP 136/62 | HR 81 | Temp 97.5°F | Wt 159.0 lb

## 2023-11-05 DIAGNOSIS — L280 Lichen simplex chronicus: Secondary | ICD-10-CM

## 2023-11-05 DIAGNOSIS — N952 Postmenopausal atrophic vaginitis: Secondary | ICD-10-CM

## 2023-11-05 DIAGNOSIS — N762 Acute vulvitis: Secondary | ICD-10-CM | POA: Diagnosis not present

## 2023-11-05 DIAGNOSIS — I272 Pulmonary hypertension, unspecified: Secondary | ICD-10-CM

## 2023-11-05 MED ORDER — LIDOCAINE-EPINEPHRINE 1 %-1:100000 IJ SOLN: 3.0000 mL | Freq: Once | INTRAMUSCULAR | Status: AC

## 2023-11-05 MED ORDER — TACROLIMUS 0.1 % EX CREA: 0.5000 g | TOPICAL_CREAM | Freq: Two times a day (BID) | CUTANEOUS | 11 refills | Status: DC

## 2023-11-05 MED ORDER — FUROSEMIDE 20 MG PO TABS
60.0000 mg | ORAL_TABLET | Freq: Every day | ORAL | 1 refills | Status: AC
Start: 2023-11-05 — End: ?

## 2023-11-05 NOTE — Assessment & Plan Note (Signed)
 Continue estrace

## 2023-11-05 NOTE — Assessment & Plan Note (Signed)
 Initial biopsy 2020, lost to follow-up Minimal improvement over 4 months of topical steroids- clobetasol  Recommend vulvar biopsy given minimal improvement, uncx today Plan to switch to topical tacrolimus  0.1% ointment sparingly to the affected area twice daily for three months. (If stings, mix 1:1 with clobetasol .)

## 2023-11-05 NOTE — Telephone Encounter (Signed)
 RX sent in

## 2023-11-05 NOTE — Telephone Encounter (Signed)
 Change requested on the Tacrolimus  0.1% Cream  Pharmacy comment: Alternative Requested:CAN ONLY GET OINTMENT.   Please advise

## 2023-11-05 NOTE — Patient Instructions (Signed)
 It is common to have vaginal bleeding and cramping for up to 72 hours after your biopsy. Please call our office with heavy vaginal bleeding, severe abdominal pain or fever. Avoid intercourse, tampon use, douching and baths for 7 days to decrease the risk of infection.

## 2023-11-05 NOTE — Telephone Encounter (Signed)
 *  STAT* If patient is at the pharmacy, call can be transferred to refill team.   1. Which medications need to be refilled? (please list name of each medication and dose if known)   furosemide  (LASIX ) 20 MG tablet     2. Would you like to learn more about the convenience, safety, & potential cost savings by using the [facility]? No     3. Are you open to using the [facility] (Type [facility]. ). No    4. Which pharmacy/location (including street and city if local pharmacy) is medication to be sent to? [id number] - [place], [place] - [address]     5. Do they need a 30 day or 90 day supply? 90 days

## 2023-11-05 NOTE — Progress Notes (Signed)
 83 y.o. G34P1001 female with lichen simplex chronicus (bx proven 01/2019), GSM, s/p TAH here for 73-month follow-up.  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     Lost to follow-up since. Reestablished 06/11/23, reporting: a lot of irritations, has been scratching. Sometimes she scratches so hard that she bleeds at times.   She was restarted on clobetasol  at that time. Today, she reports she is staying lubricated and thinks the medications have helped. She is using clobetasol  and estrace  daily.    OB History  Gravida Para Term Preterm AB Living  1 1 1   1   SAB IAB Ectopic Multiple Live Births      1    # Outcome Date GA Lbr Len/2nd Weight Sex Type Anes PTL Lv  1 Term      Vag-Spont   LIV    Past Medical History:  Diagnosis Date   Arthritis    Cataract bilaterl 3 years ago   Coronary artery disease    Diabetes mellitus 02/27/2005   Diabetes mellitus type 2, controlled, without complications (HCC) 04/26/2006   Qualifier: Diagnosis of  By: Damien Folks     Diverticulosis    Dyspnea    Echocardiogram findings abnormal, without diagnosis 2005   EF 47%, no ischemia, no infarction   Former smoker 01/10/2017   45 pack year history, quit in 2007    GERD (gastroesophageal reflux disease)    Heart murmur    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   History of bone density study 2006   Lt hip = -1.0, L spine = -1.8    Hyperlipidemia    Hypertension     Past Surgical History:  Procedure Laterality Date   ABDOMINAL HYSTERECTOMY  1989   one ovary remains per patient   CARDIAC CATHETERIZATION  08/20/2019   COLONOSCOPY     CORONARY ARTERY BYPASS GRAFT N/A 09/02/2019   Procedure: CORONARY ARTERY BYPASS GRAFTING (CABG) TIMES THREE USING LEFT INTERNAL MAMMARY ARTERY AND RIGHT GREATER SAPHENOUS VEIN;  Surgeon: Shyrl Linnie KIDD, MD;  Location: [facility] OR;  Service: Open Heart Surgery;  Laterality: N/A;    ENDOVEIN HARVEST OF GREATER SAPHENOUS VEIN Right 09/02/2019   Procedure: ENDOVEIN HARVEST OF GREATER SAPHENOUS VEIN;  Surgeon: Shyrl Linnie KIDD, MD;  Location: [facility] OR;  Service: Open Heart Surgery;  Laterality: Right;   EYE SURGERY Bilateral    CATARACT   LEFT HEART CATH AND CORONARY ANGIOGRAPHY N/A 08/20/2019   Procedure: LEFT HEART CATH AND CORONARY ANGIOGRAPHY;  Surgeon: Claudene Victory ORN, MD;  Location: [facility] INVASIVE CV LAB;  Service: Cardiovascular;  Laterality: N/A;   RIGHT AND LEFT HEART CATH N/A 07/13/2021   Procedure: RIGHT AND LEFT HEART CATH;  Surgeon: Wendel Lurena POUR, MD;  Location: [facility]VAS[facility];  Service: Cardiovascular;  Laterality: N/A;   TEE WITHOUT CARDIOVERSION N/A 09/02/2019   Procedure: TRANSESOPHAGEAL ECHOCARDIOGRAM (TEE);  Surgeon: Shyrl Linnie KIDD, MD;  Location: [facility] OR;  Service: Open Heart Surgery;  Laterality: N/A;   TONSILECTOMY, ADENOIDECTOMY, BILATERAL MYRINGOTOMY AND TUBES  1965   TONSILLECTOMY      Current Outpatient Medications on File Prior to Visit  Medication Sig Dispense Refill   acetaminophen  (TYLENOL ) 500 MG tablet Take 2 tablets (1,000 mg total) by mouth every 8 (eight) hours as needed. 30 tablet 2   aspirin  EC 81 MG tablet Take 1 tablet (81 mg total) by mouth daily. 90 tablet 3   Blood Glucose Monitoring  Suppl DEVI Test 1-2 times day. Pt needs onetouch meter 1 each 0   Cholecalciferol (D3 5000) 125 MCG (5000 UT) capsule Take 5,000 Units by mouth daily.     clobetasol  ointment (TEMOVATE ) 0.05 % Apply thin layer daily to affected area for 12 weeks. 60 g 5   CREON  36000-114000 units CPEP capsule TAKE 2 CAPSULES BY MOUTH 3 TIMES A DAY WITH MEALS AND TAKE 1 CAPSULE WITH EACH SNACK TWICE DAILY 300 capsule 9   dapagliflozin  propanediol (FARXIGA ) 5 MG TABS tablet Take 1 tablet (5 mg total) by mouth daily. 90 tablet 1   estradiol  (ESTRACE ) 0.1 MG/GM vaginal cream Apply a pea size (0.5g) amount to the vagina 2 times per week. Do not use applicator. 42.5 g 1    Glucose Blood (BLOOD GLUCOSE TEST STRIPS) STRP Test 1-2 times daily. Needs onetouch strips 100 strip 1   insulin  degludec (TRESIBA  FLEXTOUCH) 100 UNIT/ML FlexTouch Pen Inject 15 Units into the skin daily after supper. 30 day supply voucher do not bill insurance BIN [phone] PCN [employer] [id number] ID [id number] 15 mL 0   Insulin  Pen Needle (PEN NEEDLES) 33G X 4 MM MISC Use for  insulin  medication 100 each 1   Lancet Device MISC Use device to test bloodsugar with lancet 1 each 0   Lancets Misc. MISC 1-2 times a day. Needs one touch lancets 100 each 1   losartan  (COZAAR ) 50 MG tablet TAKE 1 TABLET BY MOUTH EVERY DAY 90 tablet 0   macitentan  (OPSUMIT ) 10 MG tablet TAKE 1 TABLET BY MOUTH 1 TIME A DAY. DO NOT HANDLE IF PREGNANT. DO NOT SPLIT, CRUSH, OR CHEW. REVIEW MEDICATION GUIDE 30 tablet 10   metFORMIN  (GLUCOPHAGE -XR) 750 MG 24 hr tablet TAKE 1 TABLET BY MOUTH EVERY DAY WITH BREAKFAST 90 tablet 1   metoprolol  tartrate (LOPRESSOR ) 25 MG tablet TAKE 1 TABLET (25 MG TOTAL) BY MOUTH 2 (TWO) TIMES DAILY. TAKE 25 MG BY MOUTH 2 (TWO) TIMES DAILY. 180 tablet 1   Misc. Devices MISC SIG: 1 L of Oxygen  via Nasal cannula around the clock Dispense Home Oxygen  and Portable tank and necessary supplies. Dx: Hypoxemia, Shortness of breath 1 Device 0   Multiple Vitamins-Minerals (NO IRON MULT VITAMIN-MINERALS) TABS TAKE 1 TABLET BY MOUTH DAILY 30 tablet 0   omeprazole  (PRILOSEC) 40 MG capsule TAKE 1 CAPSULE (40 MG TOTAL) BY MOUTH DAILY. 90 capsule 1   ONETOUCH VERIO test strip SMARTSIG:1 Each Via Meter Daily     potassium chloride  (KLOR-CON ) 10 MEQ tablet TAKE 2 TABLETS BY MOUTH DAILY 180 tablet 1   rosuvastatin  (CRESTOR ) 20 MG tablet TAKE 1 TABLET BY MOUTH EVERY DAY 90 tablet 1   tadalafil , PAH, (ADCIRCA ) 20 MG tablet TAKE 2 TABLETS BY MOUTH 1 TIME A DAY 60 tablet 10   TRELEGY ELLIPTA  100-62.5-25 MCG/ACT AEPB INHALE 1 PUFF INTO THE LUNGS DAILY 180 each 3   vitamin B-12 (CYANOCOBALAMIN) 500 MCG tablet Take 500 mcg by  mouth daily.     No current facility-administered medications on file prior to visit.    Allergies  Allergen Reactions   Lisinopril Itching and Cough      PE Today's Vitals   11/05/23 1120  BP: 136/62  Pulse: 81  Temp: (!) 97.5 F (36.4 C)  TempSrc: Oral  SpO2: (!) 86%  Weight: 159 lb (72.1 kg)     Body mass index is 27.72 kg/m.  Physical Exam Vitals reviewed. Exam conducted with a chaperone present.  Constitutional:  General: She is not in acute distress.    Appearance: Normal appearance.  HENT:     Head: Normocephalic and atraumatic.     Nose: Nose normal.  Eyes:     Extraocular Movements: Extraocular movements intact.     Conjunctiva/sclera: Conjunctivae normal.  Pulmonary:     Effort: Pulmonary effort is normal.  Genitourinary:    General: Normal vulva.     Exam position: Lithotomy position.     Vagina: Normal. No vaginal discharge.     Uterus: Absent.      Adnexa: Right adnexa normal and left adnexa normal.     Rectum: External hemorrhoid present.      Comments: Bilateral erythema and hypopigmentation of labia minor, expanding onto labia major, R>L and involving perineum Lichenification and whites patches resolved Cervix and uterus absent Musculoskeletal:        General: Normal range of motion.     Cervical back: Normal range of motion.  Neurological:     General: No focal deficit present.     Mental Status: She is alert.  Psychiatric:        Mood and Affect: Mood normal.        Behavior: Behavior normal.      Assessment and Plan:        Vaginal atrophy Assessment & Plan: Continue estrace    Lichen simplex chronicus Assessment & Plan: Initial biopsy 2020, lost to follow-up Minimal improvement over 4 months of topical steroids- clobetasol  Recommend vulvar biopsy given minimal improvement, uncx today Plan to switch to topical tacrolimus  0.1% ointment sparingly to the affected area twice daily for three months. (If stings, mix 1:1 with  clobetasol .)  Orders: -     Biopsy vulva -     Surgical pathology -     Tacrolimus ; Apply 0.5 g topically 2 (two) times daily. Apply to vulva  Dispense: 30 g; Refill: 11 -     Lidocaine -EPINEPHrine    Vera LULLA Pa, MD

## 2023-11-07 ENCOUNTER — Ambulatory Visit: Payer: Self-pay | Admitting: Obstetrics and Gynecology

## 2023-11-07 DIAGNOSIS — J9611 Chronic respiratory failure with hypoxia: Secondary | ICD-10-CM | POA: Diagnosis not present

## 2023-11-07 DIAGNOSIS — I270 Primary pulmonary hypertension: Secondary | ICD-10-CM | POA: Diagnosis not present

## 2023-11-07 LAB — SURGICAL PATHOLOGY

## 2023-11-09 ENCOUNTER — Encounter: Payer: Self-pay | Admitting: Pulmonary Disease

## 2023-11-09 ENCOUNTER — Ambulatory Visit: Admitting: Pulmonary Disease

## 2023-11-09 VITALS — BP 132/72 | HR 95 | Ht 63.0 in | Wt 160.0 lb

## 2023-11-09 DIAGNOSIS — R918 Other nonspecific abnormal finding of lung field: Secondary | ICD-10-CM

## 2023-11-09 DIAGNOSIS — I272 Pulmonary hypertension, unspecified: Secondary | ICD-10-CM | POA: Diagnosis not present

## 2023-11-09 DIAGNOSIS — J849 Interstitial pulmonary disease, unspecified: Secondary | ICD-10-CM

## 2023-11-09 DIAGNOSIS — J9611 Chronic respiratory failure with hypoxia: Secondary | ICD-10-CM | POA: Diagnosis not present

## 2023-11-09 DIAGNOSIS — I2721 Secondary pulmonary arterial hypertension: Secondary | ICD-10-CM

## 2023-11-09 DIAGNOSIS — J439 Emphysema, unspecified: Secondary | ICD-10-CM

## 2023-11-09 DIAGNOSIS — Z87891 Personal history of nicotine dependence: Secondary | ICD-10-CM

## 2023-11-09 LAB — COMPREHENSIVE METABOLIC PANEL WITH GFR
ALT: 11 U/L (ref 0–35)
AST: 23 U/L (ref 0–37)
Albumin: 4.7 g/dL (ref 3.5–5.2)
Alkaline Phosphatase: 115 U/L (ref 39–117)
BUN: 21 mg/dL (ref 6–23)
CO2: 27 meq/L (ref 19–32)
Calcium: 9.9 mg/dL (ref 8.4–10.5)
Chloride: 103 meq/L (ref 96–112)
Creatinine, Ser: 1.14 mg/dL (ref 0.40–1.20)
GFR: 45.11 mL/min — ABNORMAL LOW (ref >60.00)
Glucose, Bld: 141 mg/dL — ABNORMAL HIGH (ref 70–99)
Potassium: 4.5 meq/L (ref 3.5–5.1)
Sodium: 140 meq/L (ref 135–145)
Total Bilirubin: 0.6 mg/dL (ref 0.2–1.2)
Total Protein: 8.2 g/dL (ref 6.0–8.3)

## 2023-11-09 LAB — BRAIN NATRIURETIC PEPTIDE: Pro B Natriuretic peptide (BNP): 270 pg/mL — ABNORMAL HIGH (ref 0.0–100.0)

## 2023-11-09 NOTE — Progress Notes (Signed)
 @Patient  ID: Jessica Hart, female    DOB: 1940/10/11, 83 y.o.   MRN: [id number]  Chief Complaint  Patient presents with   Medical Management of Chronic Issues    Referring provider: Bulah Alm RAMAN, PA-C  HPI:   83 y.o. woman whom we are seeing in follow up for volume overload and diagnosis of pulmonary HTN on RHC.  Most recent cardiology note reviewed.  Patient presents for routine follow-up.  Continues on tadalafil  and Opsumit .  Exercising regularly.  Doing quite well.  Remains stable ox requirement 3 to 4 L.  Currently 4 L today.  Feels like he is getting stronger.  Walking more.  We discussed lab work today for intensive drug monitoring.  As well as BNP to assess for pulmonary hypertension control.  Discussed repeating the TTE next year, 1 year interval, as planned by her cardiologist for surveillance of pulmonary hypertension.  HPI at initial visit: Was in usual state of health.  Was seen PCP and routine follow-up.  Noted to be hypoxemic work-up, rooming.  Noted little bit of cough for 3 days.  She was placed on 1 L of oxygen .  Came up to 92%.  She was placed on Trelegy.  She was referred to pulmonary.  Home oxygen  was delivered.  She represented to PCP about 10 days later.  Noted to be at 87% on 1 L with exertion although no changes were made to oxygen  prescription.  She denies significant dyspnea, nothing too significant.  Able to do tasks he needs to do.  Reviewed multiple CT scans that shows mild emphysematous changes primarily in the upper lobes with interlobular septal thickening and bronchiectasis in bilateral lower lobes without clear honeycombing on my interpretation.  PMH: Hypertension, diabetes, hyperlipidemia, tobacco abuse in remission surgical history: Colon surgery, hysterectomy Family history: CAD in first relatives, no significant [name] is a 83 year old with Social history: Former smoker, 45-pack-year, quit 2008, retired, lives with husband  Public affairs consultant / Pulmonary Flowsheets:   ACT:      No data to display          MMRC:     No data to display          Epworth:      No data to display          Tests:   FENO:  No results found for: NITRICOXIDE  PFT:    Latest Ref Rng & Units 04/06/2021    3:35 PM  PFT Results  FVC-Pre L 1.74   FVC-Predicted Pre % 89   FVC-Post L 1.64   FVC-Predicted Post % 84   Pre FEV1/FVC % % 83   Post FEV1/FCV % % 84   FEV1-Pre L 1.44   FEV1-Predicted Pre % 96   FEV1-Post L 1.37   DLCO uncorrected ml/min/mmHg 6.61   DLCO UNC% % 36   DLVA Predicted % 55   TLC L 3.72   TLC % Predicted % 75   RV % Predicted % 60   Personally reviewed interpret spirometry suggestive of moderate to mild restriction versus gas trapping, no bronchodilator response, no fixed obstruction, TLC moderately reduced, DLCO severely reduced  WALK:      No data to display          Imaging: Personally reviewed and as per EMR discussion of this note No results found.   Lab Results: Personally reviewed, no anemia, no polycythemia CBC    Component Value Date/Time   WBC 7.7  04/12/2023 0911   WBC 8.2 09/29/2021 0427   RBC 4.37 04/12/2023 0911   RBC 5.41 (H) 09/29/2021 0427   HGB 12.9 04/12/2023 0911   HCT 39.7 04/12/2023 0911   PLT 226 04/12/2023 0911   MCV 91 04/12/2023 0911   MCH 29.5 04/12/2023 0911   MCH 28.3 09/29/2021 0427   MCHC 32.5 04/12/2023 0911   MCHC 33.0 09/29/2021 0427   RDW 13.1 04/12/2023 0911   LYMPHSABS 1.0 09/25/2022 1047   MONOABS 0.4 09/28/2021 2133   EOSABS 0.1 09/25/2022 1047   BASOSABS 0.1 09/25/2022 1047    BMET    Component Value Date/Time   NA 142 04/12/2023 0911   K 3.7 04/12/2023 0911   CL 103 04/12/2023 0911   CO2 23 04/12/2023 0911   GLUCOSE 69 (L) 04/12/2023 0911   GLUCOSE 106 (H) 09/29/2021 0427   BUN 16 04/12/2023 0911   CREATININE 0.94 04/12/2023 0911   CREATININE 0.69 01/10/2017 1235   CALCIUM  8.9 04/12/2023 0911    GFRNONAA 51 (L) 09/29/2021 0427   GFRNONAA 86 01/10/2017 1235   GFRAA 97 02/02/2020 1108   GFRAA 99 01/10/2017 1235    BNP    Component Value Date/Time   BNP 833.9 (H) 10/10/2021 1321   BNP 1,007.8 (H) 09/28/2021 2133    ProBNP    Component Value Date/Time   PROBNP 259.0 (H) 04/03/2023 1615    Specialty Problems       Pulmonary Problems   SOB (shortness of breath)   Pulmonary emphysema (HCC)   Pulmonary nodules   Chronic respiratory failure with hypoxia (HCC)    Allergies  Allergen Reactions   Lisinopril Itching and Cough    Immunization History  Administered Date(s) Administered   Fluad Quad(high Dose 65+) 11/11/2018   Fluad Trivalent(High Dose 65+) 11/27/2022   INFLUENZA, HIGH DOSE SEASONAL PF 02/29/2016, 01/10/2017, 11/15/2017   Influenza Whole 12/11/2007   Influenza,inj,Quad PF,6+ Mos 10/29/2012, 04/06/2014, 02/19/2015   Moderna Covid-19 Fall Seasonal Vaccine 65yrs & older 11/27/2022   PFIZER(Purple Top)SARS-COV-2 Vaccination 04/22/2019, 05/13/2019, 01/30/2020   Pneumococcal Conjugate-13 04/06/2014   Pneumococcal Polysaccharide-23 12/11/2007   Td 05/28/2004   Zoster Recombinant(Shingrix) 09/30/2022    Past Medical History:  Diagnosis Date   Arthritis    Cataract bilaterl 3 years ago   Coronary artery disease    Diabetes mellitus 02/27/2005   Diabetes mellitus type 2, controlled, without complications (HCC) 04/26/2006   Qualifier: Diagnosis of  By: Damien Folks     Diverticulosis    Dyspnea    Echocardiogram findings abnormal, without diagnosis 2005   EF 47%, no ischemia, no infarction   Former smoker 01/10/2017   45 pack year history, quit in 2007    GERD (gastroesophageal reflux disease)    Heart murmur    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   History of bone density study 2006   Lt hip = -1.0, L spine = -1.8    Hyperlipidemia    Hypertension     Tobacco History: Social History   Tobacco Use  Smoking Status Former   Current packs/day: 0.00    Average packs/day: 1 pack/day for 45.0 years (45.0 ttl pk-yrs)   Types: Cigarettes   Start date: 07/27/1960   Quit date: 07/27/2005   Years since quitting: 18.2  Smokeless Tobacco Former   Quit date: 03/2005   Counseling given: Not Answered   Continue to not smoke  Outpatient Encounter Medications as of 11/09/2023  Medication Sig   acetaminophen  (TYLENOL ) 500 MG tablet Take 2  tablets (1,000 mg total) by mouth every 8 (eight) hours as needed.   aspirin  EC 81 MG tablet Take 1 tablet (81 mg total) by mouth daily.   Blood Glucose Monitoring Suppl DEVI Test 1-2 times day. Pt needs onetouch meter   Cholecalciferol (D3 5000) 125 MCG (5000 UT) capsule Take 5,000 Units by mouth daily.   clobetasol  ointment (TEMOVATE ) 0.05 % Apply thin layer daily to affected area for 12 weeks.   CREON  36000-114000 units CPEP capsule TAKE 2 CAPSULES BY MOUTH 3 TIMES A DAY WITH MEALS AND TAKE 1 CAPSULE WITH EACH SNACK TWICE DAILY   dapagliflozin  propanediol (FARXIGA ) 5 MG TABS tablet Take 1 tablet (5 mg total) by mouth daily.   estradiol  (ESTRACE ) 0.1 MG/GM vaginal cream Apply a pea size (0.5g) amount to the vagina 2 times per week. Do not use applicator.   furosemide  (LASIX ) 20 MG tablet Take 3 tablets (60 mg total) by mouth daily.   Glucose Blood (BLOOD GLUCOSE TEST STRIPS) STRP Test 1-2 times daily. Needs onetouch strips   insulin  degludec (TRESIBA  FLEXTOUCH) 100 UNIT/ML FlexTouch Pen Inject 15 Units into the skin daily after supper. 30 day supply voucher do not bill insurance BIN [phone] PCN [employer] [id number] ID [id number]   Insulin  Pen Needle (PEN NEEDLES) 33G X 4 MM MISC Use for  insulin  medication   Lancet Device MISC Use device to test bloodsugar with lancet   Lancets Misc. MISC 1-2 times a day. Needs one touch lancets   losartan  (COZAAR ) 50 MG tablet TAKE 1 TABLET BY MOUTH EVERY DAY   macitentan  (OPSUMIT ) 10 MG tablet TAKE 1 TABLET BY MOUTH 1 TIME A DAY. DO NOT HANDLE IF PREGNANT. DO NOT SPLIT, CRUSH, OR  CHEW. REVIEW MEDICATION GUIDE   metFORMIN  (GLUCOPHAGE -XR) 750 MG 24 hr tablet TAKE 1 TABLET BY MOUTH EVERY DAY WITH BREAKFAST   metoprolol  tartrate (LOPRESSOR ) 25 MG tablet TAKE 1 TABLET (25 MG TOTAL) BY MOUTH 2 (TWO) TIMES DAILY. TAKE 25 MG BY MOUTH 2 (TWO) TIMES DAILY.   Misc. Devices MISC SIG: 1 L of Oxygen  via Nasal cannula around the clock Dispense Home Oxygen  and Portable tank and necessary supplies. Dx: Hypoxemia, Shortness of breath   Multiple Vitamins-Minerals (NO IRON MULT VITAMIN-MINERALS) TABS TAKE 1 TABLET BY MOUTH DAILY   omeprazole  (PRILOSEC) 40 MG capsule TAKE 1 CAPSULE (40 MG TOTAL) BY MOUTH DAILY.   ONETOUCH VERIO test strip SMARTSIG:1 Each Via Meter Daily   potassium chloride  (KLOR-CON ) 10 MEQ tablet TAKE 2 TABLETS BY MOUTH DAILY   rosuvastatin  (CRESTOR ) 20 MG tablet TAKE 1 TABLET BY MOUTH EVERY DAY   tacrolimus  (PROTOPIC ) 0.1 % ointment APPLY 0.5 G TOPICALLY 2 (TWO) TIMES DAILY. APPLY TO VULVA   tadalafil , PAH, (ADCIRCA ) 20 MG tablet TAKE 2 TABLETS BY MOUTH 1 TIME A DAY   TRELEGY ELLIPTA  100-62.5-25 MCG/ACT AEPB INHALE 1 PUFF INTO THE LUNGS DAILY   vitamin B-12 (CYANOCOBALAMIN) 500 MCG tablet Take 500 mcg by mouth daily.   Facility-Administered Encounter Medications as of 11/09/2023  Medication   lidocaine -EPINEPHrine  (XYLOCAINE  W/EPI) 1 %-1:100000 (with pres) injection 3 mL     Review of Systems  Review of Systems  N/a Physical Exam  BP 132/72   Pulse 95   Ht 5' 3 (1.6 m)   Wt 160 lb (72.6 kg)   SpO2 93%   PF (!) 4 L/min   BMI 28.34 kg/m   Wt Readings from Last 5 Encounters:  11/09/23 160 lb (72.6 kg)  11/05/23 159  lb (72.1 kg)  10/22/23 149 lb (67.6 kg)  09/10/23 154 lb (69.9 kg)  08/01/23 158 lb (71.7 kg)    BMI Readings from Last 5 Encounters:  11/09/23 28.34 kg/m  11/05/23 27.72 kg/m  10/22/23 25.98 kg/m  09/10/23 26.85 kg/m  08/01/23 27.55 kg/m     Physical Exam General: Well-appearing, no acute distress Eyes: EOMI, no  icterus Pulmonary: Clear, normal breathing, no crackles Cardiovascular: Regular rate and rhythm, no murmurs MSK: No synovitis, no joint effusion Neuro: Normal gait, no weakness Psych: Normal mood, full affect   Assessment & Plan:   Chronic hypoxemic respiratory failure: Suspect related to emphysema 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  Basilar fibrosis does not appear classic for UIP but no other clear pattern.  Possibly due to recurrent aspiration in setting of long history of GERD.  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  Also suspect contribution of VQ mismatch due to recently discovered likely pulmonary hypertension on TTE.  Importance of oxygen  therapy reiterated again how she should be using oxygen .  She is to use all times during the day at rest and with exertion.  Stressed keeping oxygen  saturation above 90%, ideally greater than 92%, using 4 L.  Continue nocturnal oxygen , 4 L.  She has repetitively failed trials of POC in the office with ambulation.  POC not an option as it does not keep her oxygen  saturation above 88% likely as a result of the pulsed dosing.  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  She has poor insight into this. Consider antifibrotic's but she has declined in the past.  Can reevaluate in the future.    Pulmonary hypertension: Based on TTE with significant findings of RV enlargement, mildly reduced RV function, mildly dilated RA all new compared to 2021.  Confirmed on right heart cath 06/2021 with normal LVEDP, preserved CO and CI.  Given rapid progression of disease group 1 disease is most likely.  Suspect contribution of group 3 disease and hypoxemia and nocturnal hypoxia via as historically she has not used oxygen  as prescribed now with better adherence.  No OSA on PSG.  Possible group 2 disease given diastolic dysfunction although not sure how big contributor this is.  No history of VTE/PE to suggest group 4 disease.  Long and frank discussion with  patient.  She is not a very good candidate for pulmonary vasodilators given the multifactorial nature of disease, significant emphysema on CT scan, demonstration of some lack of understanding of instructions in the past.  Given her significant emphysema, inhaled vasodilators have not demonstrated effectiveness in the past trials.  Systemic vasodilators pose significant risk of VQ mismatch.  We did discussed using pulmonary vasodilators both inhaled and oral.  Discussed at length the side effect profile and what to expect.  Discussed risk and benefits in terms of VQ mismatch in setting of parenchymal disease as well possible bilateral pulmonary edema depending on how well left-sided heart handles increased preload.  I think she would really struggle with 4 times daily medication such as Tyvaso.  We have in the past discussed at length that I would prefer this agent but given the 4 times daily dosing she does not think this is doable.  Nor does her husband.  Tolerating tadalafil  40 mg daily with improved subjective DOE and appetite, energy.  Opsumit  added 5/24 after worsening findings on TTE. Continue Tadalafil  and opsumit . Continue Lasix .  BNP remains historically elevated.  Recheck today.  LFTs for  intensive drug monitoring while on ERA.  Plan for repeat TTE 04/2024 via her cardiologist, will review.  Return in about 6 months (around 05/08/2024) for f/u Dr. Annella.   Jessica JONELLE Annella, MD 11/09/2023

## 2023-11-09 NOTE — Patient Instructions (Signed)
 I am glad you are doing well  No change in the medicine  Will get blood work today to make sure everything is safe with the medications  Return to clinic in 6 months after echocardiogram with Dr. Annella

## 2023-11-10 ENCOUNTER — Other Ambulatory Visit: Payer: Self-pay | Admitting: Medical

## 2023-11-10 DIAGNOSIS — I10 Essential (primary) hypertension: Secondary | ICD-10-CM

## 2023-11-12 ENCOUNTER — Encounter: Payer: Self-pay | Admitting: Gastroenterology

## 2023-11-12 NOTE — Telephone Encounter (Signed)
 Due in October for med check. Please schedule

## 2023-11-12 NOTE — Telephone Encounter (Signed)
Called to schedule, no answer

## 2023-11-13 ENCOUNTER — Ambulatory Visit: Payer: Self-pay | Admitting: Pulmonary Disease

## 2023-11-19 ENCOUNTER — Ambulatory Visit: Attending: Internal Medicine | Admitting: Internal Medicine

## 2023-11-19 ENCOUNTER — Encounter: Payer: Self-pay | Admitting: Internal Medicine

## 2023-11-19 VITALS — BP 114/70 | HR 87 | Resp 16 | Ht 63.0 in

## 2023-11-19 DIAGNOSIS — I5032 Chronic diastolic (congestive) heart failure: Secondary | ICD-10-CM | POA: Diagnosis not present

## 2023-11-19 DIAGNOSIS — Z951 Presence of aortocoronary bypass graft: Secondary | ICD-10-CM

## 2023-11-19 DIAGNOSIS — J439 Emphysema, unspecified: Secondary | ICD-10-CM

## 2023-11-19 DIAGNOSIS — I2510 Atherosclerotic heart disease of native coronary artery without angina pectoris: Secondary | ICD-10-CM | POA: Diagnosis not present

## 2023-11-19 DIAGNOSIS — I272 Pulmonary hypertension, unspecified: Secondary | ICD-10-CM | POA: Diagnosis not present

## 2023-11-19 DIAGNOSIS — E1169 Type 2 diabetes mellitus with other specified complication: Secondary | ICD-10-CM

## 2023-11-19 DIAGNOSIS — R252 Cramp and spasm: Secondary | ICD-10-CM

## 2023-11-19 DIAGNOSIS — Z87891 Personal history of nicotine dependence: Secondary | ICD-10-CM

## 2023-11-19 DIAGNOSIS — E785 Hyperlipidemia, unspecified: Secondary | ICD-10-CM | POA: Diagnosis not present

## 2023-11-19 DIAGNOSIS — R0600 Dyspnea, unspecified: Secondary | ICD-10-CM | POA: Diagnosis not present

## 2023-11-19 NOTE — Progress Notes (Deleted)
  Cardiology Office Note:  .   Date:  11/19/2023  ID:  Jessica Hart, DOB June 27, 1940, MRN [id number] PCP: Bulah Alm GORMAN DEVONNA  [facility] Providers Cardiologist:  Soyla DELENA Merck, MD    History of Present Illness: Jessica Hart is a 83 y.o. female.  Discussed the use of AI scribe software for clinical note transcription with the patient, who gave verbal consent to proceed.  History of Present Illness     ROS: negative except per HPI above.  Studies Reviewed: .        Results  Risk Assessment/Calculations:   {Does this patient have ATRIAL FIBRILLATION?[phone]}   Physical Exam:   VS:  BP 114/70 (BP Location: Left Arm, Patient Position: Sitting, Cuff Size: Normal)   Pulse 87   Resp 16   Ht 5' 3 (1.6 m)   SpO2 93% Comment: 4L  BMI 28.34 kg/m    Wt Readings from Last 3 Encounters:  11/09/23 160 lb (72.6 kg)  11/05/23 159 lb (72.1 kg)  10/22/23 149 lb (67.6 kg)     Physical Exam    ASSESSMENT AND PLAN: .    Assessment and Plan Assessment & Plan       Soyla Merck, MD, FACC

## 2023-11-19 NOTE — Progress Notes (Signed)
 Cardiology Office Note:  .   Date:  11/19/2023  ID:  POSIE LILLIBRIDGE, DOB 07-22-1940, MRN [id number] PCP: Bulah Alm GORMAN DEVONNA  [facility] Providers Cardiologist:  Soyla DELENA Merck, MD    History of Present Illness: Jessica Hart is a 83 y.o. female.  Discussed the use of AI scribe software for clinical note transcription with the patient, who gave verbal consent to proceed.  History of Present Illness CHRYSTEN WOULFE is an 83 year old female with CAD s/p CABG, pulmonary hypertension and chronic heart failure who presents for follow up.   She experiences severe cramps in her feet at night, which disrupt her sleep. Sleeping in a recliner provides some relief, but she struggles to sleep more than two hours when lying flat. She uses a walker to ease discomfort from the cramps.  Her medication regimen includes Lasix  (furosemide ) 20 mg three times a day, losartan  50 mg daily, metoprolol  tartrate 25 mg twice daily, potassium supplements, rosuvastatin  20 mg daily, macitentan  10 mg daily, and tadalafil  40 mg daily. She also takes Farxiga  5 mg daily for diabetes, with well-controlled blood sugar levels.  She has a history of coronary artery bypass grafting and takes a daily baby aspirin . Her breathing can be labored at times with oxygen  levels dropping, but she is not consistently using supplemental oxygen . Overall, she is doing much better on PHTN therapies and is more active than before.     ROS: negative except per HPI above.  Studies Reviewed: SABRA   EKG Interpretation Date/Time:  Monday November 19 2023 09:23:46 EDT Ventricular Rate:  94 PR Interval:  192 QRS Duration:  92 QT Interval:  386 QTC Calculation: 482 R Axis:   86  Text Interpretation: Normal sinus rhythm Possible Left atrial enlargement Minimal voltage criteria for LVH, may be normal variant ( Cornell product ) Inferior infarct (cited on or before 17-Oct-2022) When compared with ECG of  06-Apr-2023 14:24, Premature ventricular complexes are no longer Present T wave inversion less evident in Anterior leads Confirmed by Merck Soyla ([id number]) on 11/19/2023 9:51:57 AM    Results LABS BMP: Normal electrolytes LFTs: Normal liver function tests BNP: 270 Blood count: Normal (February 2025) LDL: 50  DIAGNOSTIC EKG: Normal Echocardiography: No significant change from previous Risk Assessment/Calculations:       Physical Exam:   VS:  BP 114/70 (BP Location: Left Arm, Patient Position: Sitting, Cuff Size: Normal)   Pulse 87   Resp 16   Ht 5' 3 (1.6 m)   SpO2 93% Comment: 4L  BMI 28.34 kg/m    Wt Readings from Last 3 Encounters:  11/09/23 160 lb (72.6 kg)  11/05/23 159 lb (72.1 kg)  10/22/23 149 lb (67.6 kg)     Physical Exam GENERAL: Alert, cooperative, well developed, no acute distress HEENT: Normocephalic, normal oropharynx, moist mucous membranes NECK: Jugular venous distention just above clavicle with normal inspiratory variation CHEST: Clear to auscultation bilaterally, No wheezes, rhonchi, or crackles CARDIOVASCULAR: Normal heart rate and rhythm, S1 and S2 normal without murmurs ABDOMEN: Soft, non-tender, non-distended, without organomegaly, Normal bowel sounds EXTREMITIES: No cyanosis or edema NEUROLOGICAL: Cranial nerves grossly intact, Moves all extremities without gross motor or sensory deficit   ASSESSMENT AND PLAN: .    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 EKG, echo, and BNP levels show no significant changes.  - Continue metoprolol  25 mg daily, losartan  50 mg  daily, and furosemide  60 mg daily.  - Monitor fluid intake and output. - Encourage tracking weight and nutrition.  Pulmonary hypertension - Continue macitentan  and tadalafil  per pulm. - Order echocardiogram in six months.  Atherosclerotic heart disease status post coronary artery  bypass grafting Well-controlled with current statin therapy. No new cardiac symptoms. - Continue rosuvastatin  20 mg daily. - Continue aspirin  81 mg daily for secondary prevention.  Type 2 diabetes mellitus Well-controlled with HbA1c at 5.7. Farxiga  used for management, beneficial for heart failure. - Continue Farxiga  5 mg daily. - Monitor blood glucose levels regularly.  Muscle cramps of lower extremities Likely related to furosemide  use, severe at night. Discussed remedies. - Try pickle juice, mustard, or magnesium  supplements. - Purchase magnesium  supplement, 250-400 mg as per instructions.       Soyla Merck, MD, FACC

## 2023-11-19 NOTE — Patient Instructions (Signed)
 Medication Instructions:  NO CHANGES  *If you need a refill on your cardiac medications before your next appointment, please call your pharmacy*   Testing/Procedures: Your physician has requested that you have an echocardiogram - schedule March 2026 Echocardiography is a painless test that uses sound waves to create images of your heart. It provides your doctor with information about the size and shape of your heart and how well your heart's chambers and valves are working. This procedure takes approximately one hour. There are no restrictions for this procedure. Please do NOT wear cologne, perfume, aftershave, or lotions (deodorant is allowed). Please arrive 15 minutes prior to your appointment time.  Please note: We ask at that you not bring children with you during ultrasound (echo/ vascular) testing. Due to room size and safety concerns, children are not allowed in the ultrasound rooms during exams. Our front office staff cannot provide observation of children in our lobby area while testing is being conducted. An adult accompanying a patient to their appointment will only be allowed in the ultrasound room at the discretion of the ultrasound technician under special circumstances. We apologize for any inconvenience.   Follow-Up: At [facility], you and your health needs are our priority.  As part of our continuing mission to provide you with exceptional heart care, our providers are all part of one team.  This team includes your primary Cardiologist (physician) and Advanced Practice Providers or APPs (Physician Assistants and Nurse Practitioners) who all work together to provide you with the care you need, when you need it.  Your next appointment:    6 months with Dr. Loni -- after echo  We recommend signing up for the patient portal called MyChart.  Sign up information is provided on this After Visit Summary.  MyChart is used to connect with patients for Virtual Visits  (Telemedicine).  Patients are able to view lab/test results, encounter notes, upcoming appointments, etc.  Non-urgent messages can be sent to your provider as well.   To learn more about what you can do with MyChart, go to [email or web address].

## 2023-11-28 ENCOUNTER — Ambulatory Visit (INDEPENDENT_AMBULATORY_CARE_PROVIDER_SITE_OTHER): Admitting: Medical

## 2023-11-28 VITALS — BP 120/70 | HR 80 | Ht 63.0 in | Wt 154.8 lb

## 2023-11-28 DIAGNOSIS — Z23 Encounter for immunization: Secondary | ICD-10-CM | POA: Diagnosis not present

## 2023-11-28 DIAGNOSIS — E119 Type 2 diabetes mellitus without complications: Secondary | ICD-10-CM | POA: Diagnosis not present

## 2023-11-28 DIAGNOSIS — I152 Hypertension secondary to endocrine disorders: Secondary | ICD-10-CM

## 2023-11-28 DIAGNOSIS — J439 Emphysema, unspecified: Secondary | ICD-10-CM | POA: Diagnosis not present

## 2023-11-28 DIAGNOSIS — E113393 Type 2 diabetes mellitus with moderate nonproliferative diabetic retinopathy without macular edema, bilateral: Secondary | ICD-10-CM | POA: Diagnosis not present

## 2023-11-28 DIAGNOSIS — E1169 Type 2 diabetes mellitus with other specified complication: Secondary | ICD-10-CM | POA: Diagnosis not present

## 2023-11-28 DIAGNOSIS — N952 Postmenopausal atrophic vaginitis: Secondary | ICD-10-CM | POA: Insufficient documentation

## 2023-11-28 DIAGNOSIS — I7090 Unspecified atherosclerosis: Secondary | ICD-10-CM

## 2023-11-28 DIAGNOSIS — I5032 Chronic diastolic (congestive) heart failure: Secondary | ICD-10-CM | POA: Diagnosis not present

## 2023-11-28 DIAGNOSIS — Z780 Asymptomatic menopausal state: Secondary | ICD-10-CM | POA: Insufficient documentation

## 2023-11-28 DIAGNOSIS — E1165 Type 2 diabetes mellitus with hyperglycemia: Secondary | ICD-10-CM

## 2023-11-28 DIAGNOSIS — I2721 Secondary pulmonary arterial hypertension: Secondary | ICD-10-CM

## 2023-11-28 DIAGNOSIS — K219 Gastro-esophageal reflux disease without esophagitis: Secondary | ICD-10-CM

## 2023-11-28 DIAGNOSIS — Z7185 Encounter for immunization safety counseling: Secondary | ICD-10-CM

## 2023-11-28 DIAGNOSIS — E1159 Type 2 diabetes mellitus with other circulatory complications: Secondary | ICD-10-CM

## 2023-11-28 DIAGNOSIS — Z0000 Encounter for general adult medical examination without abnormal findings: Secondary | ICD-10-CM

## 2023-11-28 NOTE — Progress Notes (Signed)
 Name: Jessica Hart   Date of Visit: 11/29/23   Date of last visit with me: 11/10/2023   CHIEF COMPLAINT:  Chief Complaint  Patient presents with   Annual Exam    Fasting AWV,,no concerns       HPI:  Discussed the use of AI scribe software for clinical note transcription with the patient, who gave verbal consent to proceed.  History of Present Illness   Medical team: Dr. Toribio Fuel, heart failure clinic Dr. Donnice Beals, pulmonology Dr. Soyla Merck, cardiology Dr. Oneil Platts, ophthalmology Dr. Arthea Sharps, ortho Dr. Kavitha Nandigam, GI Dr. Medford Angle, ENT Dr. Marsa Honour, podiatry Hanley Rispoli, Alm RAMAN, PA-C here with primary care  Jessica Hart is an 83 year old female who presents for a well visit and medication check.  She is due for several vaccinations including flu, pneumonia, tetanus, and a second shingles vaccine. She previously received a pneumonia vaccine in 2016 and a shingles vaccine, but needs the second dose after October 3rd.  She has been seeing specialists for her heart and lung conditions. Recently, she saw her cardiologist who scheduled an echo/ultrasound in six months and kept her medications unchanged. She also saw her pulmonologist in September, who did not change her medications.  She finds the current oxgen canister device cumbersome and is interested in a continuous flow option, but is unsure about insurance coverage for a portable one/backpack model.  She uses a stepper and stationary bike for exercise, which has helped her maintain some muscle mass. She notes some swelling in her hands and legs, but denies eating salty foods or junk food.   No issues with bowel movements or urination, although she urinates frequently due to her diuretic medication. She experiences dry mouth occasionally and tries to stay hydrated.   Allergies  Allergen Reactions   Lisinopril Itching and Cough    Past Medical History:   Diagnosis Date   Arthritis    Cataract bilaterl 3 years ago   Coronary artery disease    Diabetes mellitus 02/27/2005   Diabetes mellitus type 2, controlled, without complications (HCC) 04/26/2006   Qualifier: Diagnosis of  By: Damien Folks     Diverticulosis    Dyspnea    Echocardiogram findings abnormal, without diagnosis 2005   EF 47%, no ischemia, no infarction   Former smoker 01/10/2017   45 pack year history, quit in 2007    GERD (gastroesophageal reflux disease)    Heart murmur    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   History of bone density study 2006   Lt hip = -1.0, L spine = -1.8    Hyperlipidemia    Hypertension      Current Outpatient Medications:    acetaminophen  (TYLENOL ) 500 MG tablet, Take 2 tablets (1,000 mg total) by mouth every 8 (eight) hours as needed., Disp: 30 tablet, Rfl: 2   aspirin  EC 81 MG tablet, Take 1 tablet (81 mg total) by mouth daily., Disp: 90 tablet, Rfl: 3   Cholecalciferol (D3 5000) 125 MCG (5000 UT) capsule, Take 5,000 Units by mouth daily., Disp: , Rfl:    clobetasol  ointment (TEMOVATE ) 0.05 %, Apply thin layer daily to affected area for 12 weeks., Disp: 60 g, Rfl: 5   CREON  36000-114000 units CPEP capsule, TAKE 2 CAPSULES BY MOUTH 3 TIMES A DAY WITH MEALS AND TAKE 1 CAPSULE WITH EACH SNACK TWICE DAILY, Disp: 300 capsule, Rfl: 9   estradiol  (ESTRACE ) 0.1 MG/GM vaginal cream, Apply a pea size (0.5g) amount  to the vagina 2 times per week. Do not use applicator., Disp: 42.5 g, Rfl: 1   furosemide  (LASIX ) 20 MG tablet, Take 3 tablets (60 mg total) by mouth daily., Disp: 270 tablet, Rfl: 1   losartan  (COZAAR ) 50 MG tablet, TAKE 1 TABLET BY MOUTH EVERY DAY, Disp: 90 tablet, Rfl: 0   macitentan  (OPSUMIT ) 10 MG tablet, TAKE 1 TABLET BY MOUTH 1 TIME A DAY. DO NOT HANDLE IF PREGNANT. DO NOT SPLIT, CRUSH, OR CHEW. REVIEW MEDICATION GUIDE, Disp: 30 tablet, Rfl: 10   metFORMIN  (GLUCOPHAGE -XR) 750 MG 24 hr tablet, TAKE 1 TABLET BY MOUTH EVERY DAY WITH BREAKFAST,  Disp: 90 tablet, Rfl: 1   metoprolol  tartrate (LOPRESSOR ) 25 MG tablet, TAKE 1 TABLET (25 MG TOTAL) BY MOUTH 2 (TWO) TIMES DAILY. TAKE 25 MG BY MOUTH 2 (TWO) TIMES DAILY., Disp: 180 tablet, Rfl: 1   Multiple Vitamins-Minerals (NO IRON MULT VITAMIN-MINERALS) TABS, TAKE 1 TABLET BY MOUTH DAILY, Disp: 30 tablet, Rfl: 0   potassium chloride  (KLOR-CON ) 10 MEQ tablet, TAKE 2 TABLETS BY MOUTH DAILY, Disp: 180 tablet, Rfl: 1   tacrolimus  (PROTOPIC ) 0.1 % ointment, APPLY 0.5 G TOPICALLY 2 (TWO) TIMES DAILY. APPLY TO VULVA, Disp: 30 g, Rfl: 11   tadalafil , PAH, (ADCIRCA ) 20 MG tablet, TAKE 2 TABLETS BY MOUTH 1 TIME A DAY, Disp: 60 tablet, Rfl: 10   TRELEGY ELLIPTA  100-62.5-25 MCG/ACT AEPB, INHALE 1 PUFF INTO THE LUNGS DAILY, Disp: 180 each, Rfl: 3   vitamin B-12 (CYANOCOBALAMIN) 500 MCG tablet, Take 500 mcg by mouth daily., Disp: , Rfl:    Blood Glucose Monitoring Suppl DEVI, Test 1-2 times day. Pt needs onetouch meter, Disp: 1 each, Rfl: 0   dapagliflozin  propanediol (FARXIGA ) 5 MG TABS tablet, Take 1 tablet (5 mg total) by mouth daily., Disp: 90 tablet, Rfl: 2   Glucose Blood (BLOOD GLUCOSE TEST STRIPS) STRP, Test 1-2 times daily. Needs onetouch strips, Disp: 100 strip, Rfl: 1   insulin  degludec (TRESIBA  FLEXTOUCH) 100 UNIT/ML FlexTouch Pen, Inject 15 Units into the skin daily after supper. 30 day supply voucher do not bill insurance BIN [phone] PCN [id number] [id number] ID [id number], Disp: 15 mL, Rfl: 4   Insulin  Pen Needle (PEN NEEDLES) 33G X 4 MM MISC, Use for  insulin  medication, Disp: 100 each, Rfl: 1   Lancet Device MISC, Use device to test bloodsugar with lancet, Disp: 1 each, Rfl: 0   Lancets Misc. MISC, 1-2 times a day. Needs one touch lancets, Disp: 100 each, Rfl: 1   Misc. Devices MISC, SIG: 1 L of Oxygen  via Nasal cannula around the clock Dispense Home Oxygen  and Portable tank and necessary supplies. Dx: Hypoxemia, Shortness of breath, Disp: 1 Device, Rfl: 0   omeprazole  (PRILOSEC) 40 MG  capsule, TAKE 1 CAPSULE (40 MG TOTAL) BY MOUTH DAILY., Disp: 90 capsule, Rfl: 1   ONETOUCH VERIO test strip, SMARTSIG:1 Each Via Meter Daily, Disp: , Rfl:    rosuvastatin  (CRESTOR ) 20 MG tablet, Take 1 tablet (20 mg total) by mouth daily., Disp: 90 tablet, Rfl: 2  Current Facility-Administered Medications:    lidocaine -EPINEPHrine  (XYLOCAINE  W/EPI) 1 %-1:100000 (with pres) injection 3 mL, 3 mL, Infiltration, Once,   Family History  Problem Relation Age of Onset   Heart disease Father    Heart disease Sister    Cancer Sister    Tongue cancer Sister    Colon polyps Neg Hx    Rectal cancer Neg Hx    Stomach cancer Neg Hx  Past Surgical History:  Procedure Laterality Date   ABDOMINAL HYSTERECTOMY  1989   one ovary remains per patient   CARDIAC CATHETERIZATION  08/20/2019   COLONOSCOPY     CORONARY ARTERY BYPASS GRAFT N/A 09/02/2019   Procedure: CORONARY ARTERY BYPASS GRAFTING (CABG) TIMES THREE USING LEFT INTERNAL MAMMARY ARTERY AND RIGHT GREATER SAPHENOUS VEIN;  Surgeon: Shyrl Linnie KIDD, MD;  Location: [facility] OR;  Service: Open Heart Surgery;  Laterality: N/A;   ENDOVEIN HARVEST OF GREATER SAPHENOUS VEIN Right 09/02/2019   Procedure: ENDOVEIN HARVEST OF GREATER SAPHENOUS VEIN;  Surgeon: Shyrl Linnie KIDD, MD;  Location: [facility] OR;  Service: Open Heart Surgery;  Laterality: Right;   EYE SURGERY Bilateral    CATARACT   LEFT HEART CATH AND CORONARY ANGIOGRAPHY N/A 08/20/2019   Procedure: LEFT HEART CATH AND CORONARY ANGIOGRAPHY;  Surgeon: Claudene Victory ORN, MD;  Location: [facility] INVASIVE CV LAB;  Service: Cardiovascular;  Laterality: N/A;   RIGHT AND LEFT HEART CATH N/A 07/13/2021   Procedure: RIGHT AND LEFT HEART CATH;  Surgeon: Wendel Lurena POUR, MD;  Location: [facility] INVASIVE CV LAB;  Service: Cardiovascular;  Laterality: N/A;   TEE WITHOUT CARDIOVERSION N/A 09/02/2019   Procedure: TRANSESOPHAGEAL ECHOCARDIOGRAM (TEE);  Surgeon: Shyrl Linnie KIDD, MD;  Location: [facility] OR;  Service: Open Heart Surgery;   Laterality: N/A;   TONSILECTOMY, ADENOIDECTOMY, BILATERAL MYRINGOTOMY AND TUBES  1965   TONSILLECTOMY      ROS as in subjective     Objective: BP 120/70   Pulse 80   Ht 5' 3 (1.6 m)   Wt 154 lb 12.8 oz (70.2 kg)   SpO2 92%   BMI 27.42 kg/m     General appearence: alert, no distress, WD/WN, seated with oxygen  container on 4L/min, nasal cannula HEENT: normocephalic, sclerae anicteric, PERRLA, EOMi, nares patent, no discharge or erythema, pharynx normal Oral cavity: MMM, no lesions Neck: supple, no lymphadenopathy, no thyromegaly, no masses, no bruits Heart: RRR, normal S1, S2, no murmurs Lungs: decreased files, otherwise CTA bilaterally, no wheezes, rhonchi, or rales Abdomen: +bs, soft, non tender, non distended, no masses, no hepatomegaly, no splenomegaly Back: non tender Musculoskeletal: nontender, no swelling, no obvious deformity Extremities: mild LE varicose veins bilat, no edema, no cyanosis, no clubbing Pulses: 2+ symmetric, upper and 1+lower extremities, normal cap refill Neurological: alert, oriented x 3, CN2-12 intact, strength normal upper extremities and lower extremities, sensation normal throughout, DTRs 2+ throughout, no cerebellar signs, gait normal Psychiatric: normal affect, behavior normal, pleasant    Diabetic Foot Exam - Simple   Simple Foot Form Diabetic Foot exam was performed with the following findings: Yes 11/29/2023  7:45 AM  Visual Inspection No deformities, no ulcerations, no other skin breakdown bilaterally: Yes Sensation Testing Intact to touch and monofilament testing bilaterally: Yes Pulse Check See comments: Yes Comments 1+ pedal pulses       Assessment and Plan Encounter Diagnoses  Name Primary?   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 - Administer flu vaccine today. - Perform blood work to check various health parameters. -see your dentist and eye doctor yearly -follow up with specialists as planned   Vaccine recommendations: Flu vaccine given today  Return at your convenience for other vaccines: Shingles #2, Prevnar 20 pneumococcal vaccine, Covid vaccine   Postmenopausal estrogen deficiency -I placed a new order for bone density test today.  Expect a phone call about scheduling this to screen for osteoporosis   Advanced Directives: I recommend you consider completing a Health Care Power of Attorney and Living Will.   These documents respect your wishes and help alleviate burdens on your loved ones if you were to become terminally ill or be in a position to need those documents enforced.    You can complete Advanced Directives yourself, have them notarized, then have copies made for our office, for you and for anybody you feel should have them in safe keeping.  Or, you can have an attorney prepare these documents.   If you haven't updated your Last Will and Testament in a while, it may be worthwhile having an attorney prepare these documents together and save on some costs.       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 Current oxygen  therapy is cumbersome Managed with tadalafil  40 mg daily, Opsumit , Lasix , Trelegy inhaler, oxygen  therapy See separate note from 11/09/2023 visit.  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 - Advised 90% or higher oxygen  saturation, ideally greater than 92%.  Currently on 4 L/min.  Per pulmonology she is not a candidate for portable oxygen  container due to desaturation despite multiple trials in the office -Pulmonary hypertension based on TTE, RV enlargement, mildly reduced RV function, mildly dilated RA.  Confirmed on right heart cath 06/2021.  Chronic right sided heart failure with preserved ejection fractions Chronic condition managed by a cardiologist. Recent visit indicated no changes in medication. -See separate visit notes from cardiology from 11/19/2023 -Echocardiogram planned for March 2026 -She was continued on metoprolol  25 mg twice daily, losartan  50 mg daily, furosemide  60 mg daily   Hypertension -Continue losartan  50 mg daily - Continue Lopressor  metoprolol  25 mg twice daily   Type 2 diabetes mellitus Chronic condition managed with insulin  and metformin . Current regimen includes 15 units of long-acting insulin  and metformin  twice daily. - Continue Farxiga  5 mg daily - Continue metformin  XR 750 mg daily - Continue Tresiba  15 units long-acting insulin  daily   Hyperlipidemia, atherosclerosis -Continue aspirin  81 mg daily - Continue rosuvastatin  Crestor  20 mg daily   Vitamin D  deficiency -Continue vitamin D  supplement 5000 units daily   Mild varicose veins -I recommend he wear compression hose daily - Continue chair exercises   Atrophic vaginitis  -continue Estrace  cream per  gynecology  GERD -continue Omeprazole  40mg  daily   Emslee was seen today for annual exam.  Diagnoses and all orders for this visit:  Encounter for health maintenance examination in adult -  CBC -     Hemoglobin A1c -     Microalbumin/Creatinine Ratio, Urine -     Urinalysis, Routine w reflex microscopic  Pulmonary artery hypertension (HCC)  Pulmonary emphysema (HCC)  Needs flu shot -     Flu vaccine HIGH DOSE PF(Fluzone Trivalent)  Insulin  dependent type 2 diabetes mellitus (HCC)  Moderate nonproliferative diabetic retinopathy of both eyes associated with type 2 diabetes mellitus, macular edema presence unspecified (HCC)  Type 2 diabetes mellitus with hyperglycemia, unspecified whether long term insulin  use (HCC) -     Hemoglobin A1c  Hypertension associated with diabetes (HCC) -     Urinalysis, Routine w reflex microscopic  Hyperlipidemia associated with type 2 diabetes mellitus (HCC)  Chronic diastolic congestive heart failure (HCC)  Atherosclerosis  Vaccine counseling  Postmenopausal estrogen deficiency -     DG Bone Density; Future  Atrophic vaginitis  Gastroesophageal reflux disease, unspecified whether esophagitis present    F/u pending labs

## 2023-11-29 ENCOUNTER — Other Ambulatory Visit: Payer: Self-pay | Admitting: Medical

## 2023-11-29 ENCOUNTER — Ambulatory Visit: Payer: Self-pay | Admitting: Medical

## 2023-11-29 DIAGNOSIS — Z794 Long term (current) use of insulin: Secondary | ICD-10-CM

## 2023-11-29 LAB — URINALYSIS, ROUTINE W REFLEX MICROSCOPIC
Bilirubin, UA: NEGATIVE
Ketones, UA: NEGATIVE
Leukocytes,UA: NEGATIVE
Nitrite, UA: NEGATIVE
Protein,UA: NEGATIVE
RBC, UA: NEGATIVE
Specific Gravity, UA: 1.006 (ref 1.005–1.030)
Urobilinogen, Ur: 0.2 mg/dL (ref 0.2–1.0)
pH, UA: 6.5 (ref 5.0–7.5)

## 2023-11-29 LAB — MICROALBUMIN / CREATININE URINE RATIO
Creatinine, Urine: 9.5 mg/dL
Microalbumin, Urine: <3 ug/mL

## 2023-11-29 LAB — CBC
Hematocrit: 43.9 % (ref 34.0–46.6)
Hemoglobin: 14.1 g/dL (ref 11.1–15.9)
MCH: 28.4 pg (ref 26.6–33.0)
MCHC: 32.1 g/dL (ref 31.5–35.7)
MCV: 88 fL (ref 79–97)
Platelets: 303 x10E3/uL (ref 150–450)
RBC: 4.97 x10E6/uL (ref 3.77–5.28)
RDW: 14.2 % (ref 11.7–15.4)
WBC: 7.9 x10E3/uL (ref 3.4–10.8)

## 2023-11-29 LAB — HEMOGLOBIN A1C
Est. average glucose Bld gHb Est-mCnc: 117 mg/dL
Hgb A1c MFr Bld: 5.7 % — ABNORMAL HIGH (ref 4.8–5.6)

## 2023-11-29 MED ORDER — TRESIBA FLEXTOUCH 100 UNIT/ML ~~LOC~~ SOPN: 15.0000 [IU] | PEN_INJECTOR | Freq: Every day | SUBCUTANEOUS | 4 refills | Status: AC

## 2023-11-29 MED ORDER — DAPAGLIFLOZIN PROPANEDIOL 5 MG PO TABS
5.0000 mg | ORAL_TABLET | Freq: Every day | ORAL | 2 refills | Status: DC
Start: 2023-11-29 — End: 2023-12-13

## 2023-11-29 MED ORDER — ROSUVASTATIN CALCIUM 20 MG PO TABS: 20.0000 mg | ORAL_TABLET | Freq: Every day | ORAL | 2 refills | Status: AC

## 2023-11-29 NOTE — Progress Notes (Signed)
 Diabetes marker stable at 5.7%, blood counts okay, urine okay, still pending microalbumin kidney marker  Please read over your after visit summary and MyChart.  If you cannot see this then we can mail a copy  You have not completed advanced directives, living will or healthcare power of attorney.  I recommend you and your husband do this.  Expect a phone call about scheduling a bone density test to screen for osteoporosis  Return at your convenience for the pneumonia Prevnar vaccine and COVID-vaccine.  Get your shingles vaccine at the pharmacy  Continue your medicines as usual

## 2023-12-05 ENCOUNTER — Telehealth: Payer: Self-pay

## 2023-12-05 NOTE — Telephone Encounter (Signed)
 Gave pt a call,pt is coming up due for reenrollment Farxiga ,spoke with pt and is aware she will be receiving pap in the [facility] fax provider portion .

## 2023-12-06 ENCOUNTER — Ambulatory Visit: Admitting: Podiatry

## 2023-12-07 DIAGNOSIS — J9611 Chronic respiratory failure with hypoxia: Secondary | ICD-10-CM | POA: Diagnosis not present

## 2023-12-07 DIAGNOSIS — I270 Primary pulmonary hypertension: Secondary | ICD-10-CM | POA: Diagnosis not present

## 2023-12-07 NOTE — Telephone Encounter (Signed)
 Gave pt a call to let her know she has been pre-approved on AZ&ME Farxiga ,spoke with pt gave her the AZ&ME [phone], but said she wants to wait until she received the latter in the mail to call AZ&ME and will call back.

## 2023-12-12 ENCOUNTER — Telehealth: Payer: Self-pay | Admitting: Medical

## 2023-12-12 DIAGNOSIS — E1165 Type 2 diabetes mellitus with hyperglycemia: Secondary | ICD-10-CM

## 2023-12-12 NOTE — Telephone Encounter (Signed)
 Pt stopped by office & she needs refills Farxiga  sent into for her Pt Assistance

## 2023-12-13 MED ORDER — DAPAGLIFLOZIN PROPANEDIOL 5 MG PO TABS: 5.0000 mg | ORAL_TABLET | Freq: Every day | ORAL | 2 refills | Status: AC

## 2023-12-13 NOTE — Telephone Encounter (Signed)
 Rx order previously sent to [facility]. Resent to Farxiga  PAP pharmacy [facility]

## 2023-12-20 ENCOUNTER — Ambulatory Visit: Admitting: Podiatry

## 2023-12-20 ENCOUNTER — Telehealth: Payer: Self-pay

## 2023-12-20 ENCOUNTER — Encounter: Payer: Self-pay | Admitting: Podiatry

## 2023-12-20 ENCOUNTER — Other Ambulatory Visit (HOSPITAL_COMMUNITY): Payer: Self-pay

## 2023-12-20 DIAGNOSIS — B351 Tinea unguium: Secondary | ICD-10-CM

## 2023-12-20 DIAGNOSIS — M79675 Pain in left toe(s): Secondary | ICD-10-CM | POA: Diagnosis not present

## 2023-12-20 DIAGNOSIS — E1142 Type 2 diabetes mellitus with diabetic polyneuropathy: Secondary | ICD-10-CM

## 2023-12-20 DIAGNOSIS — L84 Corns and callosities: Secondary | ICD-10-CM

## 2023-12-20 DIAGNOSIS — M79674 Pain in right toe(s): Secondary | ICD-10-CM

## 2023-12-20 NOTE — Progress Notes (Signed)
   12/20/2023  Patient ID: Jessica Hart, female   DOB: 23-Aug-1940, 83 y.o.   MRN: [id number]  Patient completed her portion of the Farxiga  2026 renewal application for AZ&Me PAP. Submitted to company via fax, pending response.  Jessica Hart, PharmD Clinical Pharmacist [phone]

## 2023-12-20 NOTE — Progress Notes (Signed)
  Subjective:  Patient ID: Jessica Hart, female    DOB: 1940-06-02,  MRN: [id number]  Chief Complaint  Patient presents with   [facility]    [facility] no pain reported A1c 5.7. 81 mg Asprin     83 y.o. female presents with the above complaint. History confirmed with patient. Patient presenting with pain related to dystrophic thickened elongated nails. Patient is unable to trim own nails related to nail dystrophy and/or mobility issues. Patient does have a history of T2DM. Patient does have callus present located at the left plantar foot sub 2nd and 5th met head causing pain.  Patient does have some level of numbness in the feet.  She does take insulin .  She is on home O2.  Last A1c 5.7  Objective:  Physical Exam: warm to cool, good capillary refill, pedal skin atrophic, decreased pedal hair growth nail exam onychomycosis of the toenails, onycholysis, and dystrophic nails protective sensation absent DP pulses are very weakly palpable nonpalpable PT pulses cap refill intact all toes. Left Foot:  Pain with palpation of nails due to elongation and dystrophic growth.  Painful calluses present left subsecond, left subfifth metatarsal heads. Right Foot: Pain with palpation of nails due to elongation and dystrophic growth.  Overall pes planus bilaterally, asymptomatic bunions left greater than right, fat pad atrophy, prominent fifth metatarsal heads plantarly.  Assessment:   1. DM type 2 with diabetic peripheral neuropathy (HCC)   2. Pain due to onychomycosis of toenails of both feet   3. Pre-ulcerative calluses        Plan:  Patient was evaluated and treated and all questions answered.  #Hyperkeratotic lesions/pre ulcerative calluses present subsecond metatarsal head and subfifth metatarsal head on the left foot All symptomatic hyperkeratoses x2 were safely debrided with a sterile #312 blade to patient's level of comfort without incident. We discussed preventative and palliative care of these  lesions including supportive and accommodative shoegear, padding, prefabricated and custom molded accommodative orthoses, use of a pumice stone and lotions/creams daily.   #Onychomycosis with pain  -Nails palliatively debrided as below. -Educated on self-care  Procedure: Nail Debridement Rationale: Pain Type of Debridement: manual, sharp debridement. Instrumentation: Nail nipper, rotary burr. Number of Nails: 10   Patient educated on diabetes. Discussed proper diabetic foot care and discussed risks and complications of disease. Educated patient in depth on reasons to return to the office immediately should he/she discover anything concerning or new on the feet. All questions answered. Discussed proper shoes as well.     Return in about 3 months (around 03/21/2024) for Diabetic Foot Care.         Ethan Saddler, DPM [facility]

## 2023-12-26 ENCOUNTER — Other Ambulatory Visit (HOSPITAL_COMMUNITY)

## 2024-01-07 DIAGNOSIS — J9611 Chronic respiratory failure with hypoxia: Secondary | ICD-10-CM | POA: Diagnosis not present

## 2024-01-10 ENCOUNTER — Telehealth: Payer: Self-pay

## 2024-01-10 NOTE — Progress Notes (Signed)
   01/10/2024  Patient ID: Jessica Hart, female   DOB: June 16, 1940, 83 y.o.   MRN: [id number]  Patient brought in her completed [employer]  AZ&Me renewal form for 2026. Submitted full application to company, pending decision.  Jon VEAR Lindau, PharmD Clinical Pharmacist [phone]

## 2024-01-10 NOTE — Telephone Encounter (Signed)
 Pt brought AZ&ME Okey to provider office [facility] Jon faxed to [facility]

## 2024-01-11 ENCOUNTER — Telehealth: Payer: Self-pay

## 2024-01-11 NOTE — Telephone Encounter (Signed)
 Mail out PAP [employer] Creon  to pt and faxed provider portion.

## 2024-01-11 NOTE — Telephone Encounter (Signed)
 Spoke with pt today gave consent to do PAP online [employer] Tresiba  and pen needles, faxed provider portion today.

## 2024-01-16 NOTE — Telephone Encounter (Signed)
 Copied from CRM #[id number]. Topic: General - Other >> Jan 16, 2024  3:54 PM Carlyon D wrote: Reason for CRM: Clemencia calling from [facility] in regards to receiving an application with only the patients portionof the app.  Clemencia is stating the providers portion of the application is missing she is asking if this can be faxed back over with the providers portion filled in.  Call back # for miss Clemencia [phone]  Fax # [phone]

## 2024-01-17 NOTE — Telephone Encounter (Signed)
 Provider portion faxed into [employer]

## 2024-01-21 NOTE — Telephone Encounter (Signed)
 Received a faxed letter from [employer] requesting pt Ins card,faxed today to [employer].will follow up.

## 2024-01-31 ENCOUNTER — Other Ambulatory Visit (HOSPITAL_COMMUNITY): Payer: Self-pay

## 2024-02-04 ENCOUNTER — Other Ambulatory Visit: Payer: Self-pay | Admitting: Pulmonary Disease

## 2024-02-04 ENCOUNTER — Ambulatory Visit: Admitting: Obstetrics and Gynecology

## 2024-02-04 ENCOUNTER — Encounter: Payer: Self-pay | Admitting: Obstetrics and Gynecology

## 2024-02-04 VITALS — BP 140/82 | HR 90 | Temp 97.5°F | Wt 158.0 lb

## 2024-02-04 DIAGNOSIS — N952 Postmenopausal atrophic vaginitis: Secondary | ICD-10-CM | POA: Diagnosis not present

## 2024-02-04 DIAGNOSIS — I2721 Secondary pulmonary arterial hypertension: Secondary | ICD-10-CM

## 2024-02-04 DIAGNOSIS — L280 Lichen simplex chronicus: Secondary | ICD-10-CM

## 2024-02-04 NOTE — Progress Notes (Signed)
 83 y.o. 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     Lost to follow-up since. Reestablished 06/11/23, reporting: a lot of irritations, has been scratching. Sometimes she scratches so hard that she bleeds at times.   She was restarted on clobetasol  at that time with minimal improvement. Biopsy was repeated and patient was switched to tacrolimus  on 11/05/23. Today, she reports she has only been using the tacrolimus  once a day and estrace  twice a week.  11/05/23 path: A. VULVA, BIOPSY:       Lichenoid vulvitis.       See comment.   COMMENT:  The specimen shows lymphocytic, lichenoid dermatitis with pigmented histocytes. The differential diagnosis includes lichen simplex chronicus, drug reaction or psoriasis. Clinical correlation is  recommended.  The case was peer-reviewed by Drs. Belvie and Picklesimer who agrees with the interpretation.    OB History  Gravida Para Term Preterm AB Living  1 1 1   1   SAB IAB Ectopic Multiple Live Births      1    # Outcome Date GA Lbr Len/2nd Weight Sex Type Anes PTL Lv  1 Term      Vag-Spont   LIV    Past Medical History:  Diagnosis Date   Arthritis    Cataract bilaterl 3 years ago   Coronary artery disease    Diabetes mellitus 02/27/2005   Diabetes mellitus type 2, controlled, without complications (HCC) 04/26/2006   Qualifier: Diagnosis of  By: Damien Folks     Diverticulosis    Dyspnea    Echocardiogram findings abnormal, without diagnosis 2005   EF 47%, no ischemia, no infarction   Former smoker 01/10/2017   45 pack year history, quit in 2007    GERD (gastroesophageal reflux disease)    Heart murmur    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   History of bone density study 2006   Lt hip = -1.0, L spine = -1.8    Hyperlipidemia    Hypertension      Past Surgical History:  Procedure Laterality Date   ABDOMINAL HYSTERECTOMY  1989   one ovary remains per patient   CARDIAC CATHETERIZATION  08/20/2019   COLONOSCOPY     CORONARY ARTERY BYPASS GRAFT N/A 09/02/2019   Procedure: CORONARY ARTERY BYPASS GRAFTING (CABG) TIMES THREE USING LEFT INTERNAL MAMMARY ARTERY AND RIGHT GREATER SAPHENOUS VEIN;  Surgeon: Shyrl Linnie KIDD, MD;  Location: [facility] OR;  Service: Open Heart Surgery;  Laterality: N/A;   ENDOVEIN HARVEST OF GREATER SAPHENOUS VEIN Right 09/02/2019   Procedure: ENDOVEIN HARVEST OF GREATER SAPHENOUS VEIN;  Surgeon: Shyrl Linnie KIDD, MD;  Location: [facility] OR;  Service: Open Heart Surgery;  Laterality: Right;   EYE SURGERY Bilateral    CATARACT   LEFT HEART CATH AND CORONARY ANGIOGRAPHY N/A 08/20/2019   Procedure: LEFT HEART CATH AND CORONARY ANGIOGRAPHY;  Surgeon: Claudene Victory ORN, MD;  Location: [facility] INVASIVE CV LAB;  Service: Cardiovascular;  Laterality: N/A;   RIGHT AND LEFT HEART CATH N/A 07/13/2021   Procedure: RIGHT AND LEFT HEART CATH;  Surgeon: Wendel Lurena POUR, MD;  Location: [facility] INVASIVE CV LAB;  Service: Cardiovascular;  Laterality: N/A;   TEE WITHOUT CARDIOVERSION N/A 09/02/2019   Procedure: TRANSESOPHAGEAL ECHOCARDIOGRAM (TEE);  Surgeon: Shyrl Linnie KIDD, MD;  Location: [facility] OR;  Service: Open Heart Surgery;  Laterality: N/A;  TONSILECTOMY, ADENOIDECTOMY, BILATERAL MYRINGOTOMY AND TUBES  1965   TONSILLECTOMY      Current Outpatient Medications on File Prior to Visit  Medication Sig Dispense Refill   acetaminophen  (TYLENOL ) 500 MG tablet Take 2 tablets (1,000 mg total) by mouth every 8 (eight) hours as needed. 30 tablet 2   aspirin  EC 81 MG tablet Take 1 tablet (81 mg total) by mouth daily. 90 tablet 3   Blood Glucose Monitoring Suppl DEVI Test 1-2 times day. Pt needs onetouch meter 1 each 0   Cholecalciferol (D3 5000) 125 MCG (5000 UT) capsule Take 5,000 Units by mouth daily.     CREON  36000-114000 units CPEP capsule TAKE 2  CAPSULES BY MOUTH 3 TIMES A DAY WITH MEALS AND TAKE 1 CAPSULE WITH EACH SNACK TWICE DAILY 300 capsule 9   dapagliflozin  propanediol (FARXIGA ) 5 MG TABS tablet Take 1 tablet (5 mg total) by mouth daily. 90 tablet 2   estradiol  (ESTRACE ) 0.1 MG/GM vaginal cream Apply a pea size (0.5g) amount to the vagina 2 times per week. Do not use applicator. 42.5 g 1   furosemide  (LASIX ) 20 MG tablet Take 3 tablets (60 mg total) by mouth daily. 270 tablet 1   Glucose Blood (BLOOD GLUCOSE TEST STRIPS) STRP Test 1-2 times daily. Needs onetouch strips 100 strip 1   insulin  degludec (TRESIBA  FLEXTOUCH) 100 UNIT/ML FlexTouch Pen Inject 15 Units into the skin daily after supper. 30 day supply voucher do not bill insurance BIN [phone] PCN [employer] [id number] ID [id number] 15 mL 4   Insulin  Pen Needle (PEN NEEDLES) 33G X 4 MM MISC Use for  insulin  medication 100 each 1   Lancet Device MISC Use device to test bloodsugar with lancet 1 each 0   Lancets Misc. MISC 1-2 times a day. Needs one touch lancets 100 each 1   losartan  (COZAAR ) 50 MG tablet TAKE 1 TABLET BY MOUTH EVERY DAY 90 tablet 0   macitentan  (OPSUMIT ) 10 MG tablet TAKE 1 TABLET BY MOUTH 1 TIME A DAY. DO NOT HANDLE IF PREGNANT. DO NOT SPLIT, CRUSH, OR CHEW. REVIEW MEDICATION GUIDE 30 tablet 10   metFORMIN  (GLUCOPHAGE -XR) 750 MG 24 hr tablet TAKE 1 TABLET BY MOUTH EVERY DAY WITH BREAKFAST 90 tablet 1   metoprolol  tartrate (LOPRESSOR ) 25 MG tablet TAKE 1 TABLET (25 MG TOTAL) BY MOUTH 2 (TWO) TIMES DAILY. TAKE 25 MG BY MOUTH 2 (TWO) TIMES DAILY. 180 tablet 1   Misc. Devices MISC SIG: 1 L of Oxygen  via Nasal cannula around the clock Dispense Home Oxygen  and Portable tank and necessary supplies. Dx: Hypoxemia, Shortness of breath 1 Device 0   Multiple Vitamins-Minerals (NO IRON MULT VITAMIN-MINERALS) TABS TAKE 1 TABLET BY MOUTH DAILY 30 tablet 0   omeprazole  (PRILOSEC) 40 MG capsule TAKE 1 CAPSULE (40 MG TOTAL) BY MOUTH DAILY. 90 capsule 1   ONETOUCH VERIO test strip  SMARTSIG:1 Each Via Meter Daily     potassium chloride  (KLOR-CON ) 10 MEQ tablet TAKE 2 TABLETS BY MOUTH DAILY 180 tablet 1   rosuvastatin  (CRESTOR ) 20 MG tablet Take 1 tablet (20 mg total) by mouth daily. 90 tablet 2   tacrolimus  (PROTOPIC ) 0.1 % ointment APPLY 0.5 G TOPICALLY 2 (TWO) TIMES DAILY. APPLY TO VULVA 30 g 11   tadalafil , PAH, (ADCIRCA ) 20 MG tablet TAKE 2 TABLETS BY MOUTH 1 TIME A DAY 60 tablet 10   TRELEGY ELLIPTA  100-62.5-25 MCG/ACT AEPB INHALE 1 PUFF INTO THE LUNGS DAILY 180 each 3   vitamin B-12 (  CYANOCOBALAMIN) 500 MCG tablet Take 500 mcg by mouth daily.     Current Facility-Administered Medications on File Prior to Visit  Medication Dose Route Frequency Provider Last Rate Last Admin   lidocaine -EPINEPHrine  (XYLOCAINE  W/EPI) 1 %-1:100000 (with pres) injection 3 mL  3 mL Infiltration Once         Allergies  Allergen Reactions   Lisinopril Itching and Cough      PE Today's Vitals   02/04/24 1030  BP: (!) 140/82  Pulse: 90  Temp: (!) 97.5 F (36.4 C)  TempSrc: Oral  SpO2: 91%  Weight: 158 lb (71.7 kg)     Body mass index is 27.99 kg/m.  Physical Exam Vitals reviewed. Exam conducted with a chaperone present.  Constitutional:      General: She is not in acute distress.    Appearance: Normal appearance.  HENT:     Head: Normocephalic and atraumatic.     Nose: Nose normal.  Eyes:     Extraocular Movements: Extraocular movements intact.     Conjunctiva/sclera: Conjunctivae normal.  Pulmonary:     Effort: Pulmonary effort is normal.     Comments: On oxygen  via nasal cannula Genitourinary:    General: Normal vulva.     Exam position: Lithotomy position.     Rectum: External hemorrhoid present.      Comments: Bilateral erythematous patch of labia minor and major, R>L and involving perineum and perirectal area, perirectal area seems to have widened Lichenification and whites patches RESOLVED  Internal exam deferred Musculoskeletal:        General:  Normal range of motion.     Cervical back: Normal range of motion.  Neurological:     General: No focal deficit present.     Mental Status: She is alert.  Psychiatric:        Mood and Affect: Mood normal.        Behavior: Behavior normal.      Assessment and Plan:        Lichen simplex chronicus Chronic LSC, unimproved on exam despite switch to tacrolimus .  Patient has only been using it daily. Reviewed tacrolimus  dosing: BID x6wk.  Given minimal change on exam and worsening around perirectal area since April 2025, recommend derm referral, placed urgent. -     Ambulatory referral to Dermatology  Vaginal atrophy Continue vaginal estrace   RTO in 6wk for f/u  28 min  total time was spent for this patient encounter, including preparation, face-to-face counseling with the patient, coordination of care, and documentation of the encounter.    Vera LULLA Pa, MD

## 2024-02-04 NOTE — Patient Instructions (Addendum)
 Start applying tacrolimus  TWICE a DAY to vulva and peri-rectal area.  Apply estrace  inside the vagina twice a week.  Apply vaseline to vulva daily after toileting.

## 2024-02-04 NOTE — Telephone Encounter (Signed)
 Refill sent for OPSUMIT  to [facility]: [phone]  Dose: 10mg  tab by mouth once daily   Last OV: 11/09/23 Provider: Dr. Annella Pertinent labs: LFTs wnl 11/09/23, Hgb/Hct wnl 11/28/23  Next OV: 05/08/24  Aleck Puls, PharmD, BCPS Clinical Pharmacist  [facility]

## 2024-02-04 NOTE — Telephone Encounter (Signed)
 Refill sent for TADALAFIL  to [facility]: [phone]  Dose: 40mg  by mouth once daily   Last OV: 11/09/23 Provider: Dr. Annella  Next OV: 05/08/24  Aleck Puls, PharmD, BCPS Clinical Pharmacist  [facility]

## 2024-02-04 NOTE — Telephone Encounter (Signed)
 Received pt portion Abvvie (creon ) back from pt waiting on provider portion.

## 2024-02-06 DIAGNOSIS — J9611 Chronic respiratory failure with hypoxia: Secondary | ICD-10-CM | POA: Diagnosis not present

## 2024-02-06 DIAGNOSIS — I270 Primary pulmonary hypertension: Secondary | ICD-10-CM | POA: Diagnosis not present

## 2024-02-12 ENCOUNTER — Other Ambulatory Visit: Payer: Self-pay | Admitting: Medical

## 2024-02-13 ENCOUNTER — Other Ambulatory Visit: Payer: Self-pay | Admitting: Pulmonary Disease

## 2024-02-13 ENCOUNTER — Ambulatory Visit: Admitting: Physician Assistant

## 2024-02-13 ENCOUNTER — Other Ambulatory Visit: Payer: Self-pay | Admitting: Medical

## 2024-02-13 ENCOUNTER — Encounter: Payer: Self-pay | Admitting: Physician Assistant

## 2024-02-13 VITALS — BP 127/60

## 2024-02-13 DIAGNOSIS — I2721 Secondary pulmonary arterial hypertension: Secondary | ICD-10-CM

## 2024-02-13 DIAGNOSIS — R21 Rash and other nonspecific skin eruption: Secondary | ICD-10-CM | POA: Diagnosis not present

## 2024-02-13 DIAGNOSIS — L409 Psoriasis, unspecified: Secondary | ICD-10-CM | POA: Diagnosis not present

## 2024-02-13 DIAGNOSIS — L308 Other specified dermatitis: Secondary | ICD-10-CM

## 2024-02-13 DIAGNOSIS — I10 Essential (primary) hypertension: Secondary | ICD-10-CM

## 2024-02-13 NOTE — Progress Notes (Unsigned)
° °  New Patient Visit   Subjective  Jessica Hart is a 83 y.o. female who presents for the following: Biopsy proven LSC of vulva and perineum. She has used clobetasol  and tacrolimus  without improvement. No history of cancer. No history of abnormal PAP smears.    The following portions of the chart were reviewed this encounter and updated as appropriate: medications, allergies, medical history  Review of Systems:  No other skin or systemic complaints except as noted in HPI or Assessment and Plan.  Objective  Well appearing patient in no apparent distress; mood and affect are within normal limits.   A focused examination was performed of the following areas:   Relevant exam findings are noted in the Assessment and Plan.   Vulva Depigmented patches of vulva extending to perianal area  Assessment & Plan     RASH Vulva - Skin / nail biopsy - Vulva Type of biopsy: punch   Informed consent: discussed and consent obtained   Timeout: patient name, date of birth, surgical site, and procedure verified   Procedure prep:  Patient was prepped and draped in usual sterile fashion (the patient was cleaned and prepped) Prep type:  Isopropyl alcohol Anesthesia: the lesion was anesthetized in a standard fashion   Anesthetic:  1% lidocaine  w/ epinephrine  1-100,000 buffered w/ 8.4% NaHCO3 Punch size:  4 mm Hemostasis achieved with: pressure and Gelfoam   Outcome: patient tolerated procedure well   Post-procedure details: sterile dressing applied and wound care instructions given   Dressing type: bandage, petrolatum and pressure dressing    Specimen 1 - Surgical pathology Differential Diagnosis: Vitiligo vs LSC vs Paget's Disease Check Margins: No  Previous biopsy showed lichenoid vulvitis  Return for Pending biopsy results.  I, Roseline Hutchinson, CMA, am acting as scribe for SANDRIDGE,BRENDA K, PA-C .   Documentation: I have reviewed the above documentation for accuracy and  completeness, and I agree with the above.  SANDRIDGE,BRENDA K, PA-C

## 2024-02-13 NOTE — Patient Instructions (Signed)

## 2024-02-13 NOTE — Telephone Encounter (Signed)
 Refill sent for OPSUMIT  to [facility]: [phone]  Dose: 10mg  tablet, Take 1 tablet by mouth once daily   Last OV: 11/09/23 Provider: Dr. Annella Pertinent labs: LFTs wnl 11/09/23, Hgb/Hct wnl 11/28/23  Next OV: 05/09/23  Aleck Puls, PharmD, BCPS Clinical Pharmacist  [facility]

## 2024-02-14 ENCOUNTER — Telehealth: Payer: Self-pay

## 2024-02-14 NOTE — Telephone Encounter (Signed)
 Gave [employer] a call ,provider's office keeps receiving a letter missing Imf ,spoke with representative explain they are having issues with inputing prescription in the system,needed a supervisor to over right, this will get fixed with in 72 hours ,and we can call back then to see if the issue is fix.

## 2024-02-14 NOTE — Progress Notes (Signed)
° °  02/14/2024  Patient ID: Jessica Hart, female   DOB: Apr 05, 1940, 83 y.o.   MRN: [id number]  Patient has been APPROVED to continue to receive Farxiga  through [employer] through 02/26/25.  Creon  - patient portion completed, routing to CPhT med assist team for f/u on provider portion (prescribed by Dr. Shila)  Tresiba  via [employer] - fully completed - patient and provider portion turned in - routing to CPhT for follow up  Jon VEAR Lindau, PharmD Clinical Pharmacist [phone]

## 2024-02-15 LAB — SURGICAL PATHOLOGY

## 2024-02-15 NOTE — Telephone Encounter (Signed)
 Received provider portion [employer](Creon ) faxed to [employer] along pt portion and Ins card.

## 2024-02-17 ENCOUNTER — Encounter: Payer: Self-pay | Admitting: Physician Assistant

## 2024-03-04 ENCOUNTER — Other Ambulatory Visit: Payer: Self-pay | Admitting: Medical

## 2024-03-04 DIAGNOSIS — K219 Gastro-esophageal reflux disease without esophagitis: Secondary | ICD-10-CM

## 2024-03-05 ENCOUNTER — Ambulatory Visit: Payer: Self-pay | Admitting: Physician Assistant

## 2024-03-05 NOTE — Progress Notes (Signed)
 1. Skin, vulva :       PSORIASIFORM AND SPONGIOTIC DERMATITIS, IMPETIGINIZED -- I suspect she could have psoriasis OR vitiligo.   I have LM for patient to call me back so I can review findings and treatments (if desires) - over the phone or if she wishes, I am happy to see her back in clinic.

## 2024-03-07 NOTE — Telephone Encounter (Signed)
 Received approval letter from [employer]  thru 02/26/2025,approval letter index

## 2024-03-09 ENCOUNTER — Other Ambulatory Visit: Payer: Self-pay | Admitting: Medical

## 2024-03-11 NOTE — Telephone Encounter (Signed)
 Received approval letter from [employer] ([employer]on ) thru 02/26/2025, approval letter index.

## 2024-03-13 ENCOUNTER — Telehealth: Payer: Self-pay

## 2024-03-13 NOTE — Progress Notes (Signed)
" ° °  03/13/2024  Patient ID: Jessica Hart, female   DOB: 11/25/1940, 84 y.o.   MRN: [id number]  Received notification from [employer] that they need a copy of patient's insurance card for 2026. Faxed into company for processing.  Jon VEAR Lindau, PharmD Clinical Pharmacist [phone]  "

## 2024-03-18 NOTE — Telephone Encounter (Signed)
 Received approval letter from [employer](Tresiba ) thru 02/26/2025, approval letter index.

## 2024-03-22 ENCOUNTER — Other Ambulatory Visit: Payer: Self-pay | Admitting: Medical

## 2024-03-31 ENCOUNTER — Ambulatory Visit: Admitting: Obstetrics and Gynecology

## 2024-04-03 ENCOUNTER — Ambulatory Visit: Admitting: Podiatry

## 2024-04-09 ENCOUNTER — Ambulatory Visit: Admitting: Obstetrics and Gynecology

## 2024-04-16 ENCOUNTER — Ambulatory Visit: Admitting: Obstetrics and Gynecology

## 2024-05-01 ENCOUNTER — Ambulatory Visit: Admitting: Podiatry

## 2024-05-07 ENCOUNTER — Other Ambulatory Visit (HOSPITAL_COMMUNITY)

## 2024-05-08 ENCOUNTER — Ambulatory Visit: Admitting: Pulmonary Disease

## 2024-05-13 ENCOUNTER — Ambulatory Visit

## 2024-05-23 ENCOUNTER — Ambulatory Visit: Admitting: Internal Medicine

## 2024-12-01 ENCOUNTER — Encounter: Payer: Self-pay | Admitting: Medical
